# Patient Record
Sex: Female | Born: 1965 | Race: White | Hispanic: No | Marital: Married | Smoking: Never smoker
Health system: Southern US, Community
[De-identification: ages and names within clinical notes are randomized; demographics above are authoritative.]

## PROBLEM LIST (undated history)

## (undated) DIAGNOSIS — J329 Chronic sinusitis, unspecified: Secondary | ICD-10-CM

## (undated) DIAGNOSIS — J45909 Unspecified asthma, uncomplicated: Secondary | ICD-10-CM

## (undated) DIAGNOSIS — M545 Low back pain, unspecified: Secondary | ICD-10-CM

## (undated) DIAGNOSIS — E669 Obesity, unspecified: Secondary | ICD-10-CM

## (undated) DIAGNOSIS — Z9889 Other specified postprocedural states: Secondary | ICD-10-CM

## (undated) DIAGNOSIS — G5732 Lesion of lateral popliteal nerve, left lower limb: Secondary | ICD-10-CM

## (undated) DIAGNOSIS — K449 Diaphragmatic hernia without obstruction or gangrene: Secondary | ICD-10-CM

## (undated) DIAGNOSIS — R112 Nausea with vomiting, unspecified: Secondary | ICD-10-CM

## (undated) DIAGNOSIS — F32A Depression, unspecified: Secondary | ICD-10-CM

## (undated) DIAGNOSIS — G894 Chronic pain syndrome: Secondary | ICD-10-CM

## (undated) DIAGNOSIS — M159 Polyosteoarthritis, unspecified: Secondary | ICD-10-CM

## (undated) DIAGNOSIS — M1990 Unspecified osteoarthritis, unspecified site: Secondary | ICD-10-CM

## (undated) DIAGNOSIS — R110 Nausea: Secondary | ICD-10-CM

## (undated) DIAGNOSIS — J189 Pneumonia, unspecified organism: Secondary | ICD-10-CM

## (undated) DIAGNOSIS — R7303 Prediabetes: Secondary | ICD-10-CM

## (undated) DIAGNOSIS — R296 Repeated falls: Secondary | ICD-10-CM

## (undated) DIAGNOSIS — I739 Peripheral vascular disease, unspecified: Secondary | ICD-10-CM

## (undated) DIAGNOSIS — M4310 Spondylolisthesis, site unspecified: Secondary | ICD-10-CM

## (undated) DIAGNOSIS — G629 Polyneuropathy, unspecified: Secondary | ICD-10-CM

## (undated) DIAGNOSIS — K219 Gastro-esophageal reflux disease without esophagitis: Secondary | ICD-10-CM

## (undated) DIAGNOSIS — I82503 Chronic embolism and thrombosis of unspecified deep veins of lower extremity, bilateral: Secondary | ICD-10-CM

## (undated) DIAGNOSIS — E785 Hyperlipidemia, unspecified: Secondary | ICD-10-CM

## (undated) DIAGNOSIS — F419 Anxiety disorder, unspecified: Secondary | ICD-10-CM

## (undated) DIAGNOSIS — M5030 Other cervical disc degeneration, unspecified cervical region: Secondary | ICD-10-CM

## (undated) DIAGNOSIS — M719 Bursopathy, unspecified: Secondary | ICD-10-CM

## (undated) DIAGNOSIS — R7309 Other abnormal glucose: Secondary | ICD-10-CM

## (undated) DIAGNOSIS — Z1211 Encounter for screening for malignant neoplasm of colon: Secondary | ICD-10-CM

## (undated) DIAGNOSIS — R632 Polyphagia: Secondary | ICD-10-CM

## (undated) DIAGNOSIS — I82402 Acute embolism and thrombosis of unspecified deep veins of left lower extremity: Secondary | ICD-10-CM

## (undated) DIAGNOSIS — T7840XA Allergy, unspecified, initial encounter: Secondary | ICD-10-CM

## (undated) DIAGNOSIS — F329 Major depressive disorder, single episode, unspecified: Secondary | ICD-10-CM

## (undated) HISTORY — DX: Other specified postprocedural states: R11.2

## (undated) HISTORY — DX: Anxiety disorder, unspecified: F41.9

## (undated) HISTORY — DX: Unspecified osteoarthritis, unspecified site: M19.90

## (undated) HISTORY — DX: Bursopathy, unspecified: M71.9

## (undated) HISTORY — PX: HIP SURGERY: SHX245

## (undated) HISTORY — DX: Chronic sinusitis, unspecified: J32.9

## (undated) HISTORY — DX: Low back pain, unspecified: M54.50

## (undated) HISTORY — PX: JOINT REPLACEMENT: SHX530

## (undated) HISTORY — DX: Other specified postprocedural states: Z98.890

## (undated) HISTORY — DX: Allergy, unspecified, initial encounter: T78.40XA

## (undated) HISTORY — DX: Encounter for screening for malignant neoplasm of colon: Z12.11

## (undated) HISTORY — DX: Gastro-esophageal reflux disease without esophagitis: K21.9

## (undated) HISTORY — DX: Acute embolism and thrombosis of unspecified deep veins of left lower extremity: I82.402

## (undated) HISTORY — DX: Pneumonia, unspecified organism: J18.9

## (undated) HISTORY — DX: Repeated falls: R29.6

## (undated) HISTORY — DX: Obesity, unspecified: E66.9

## (undated) HISTORY — PX: SCLEROTHERAPY: SHX6841

## (undated) HISTORY — DX: Diaphragmatic hernia without obstruction or gangrene: K44.9

## (undated) HISTORY — DX: Lesion of lateral popliteal nerve, left lower limb: G57.32

## (undated) HISTORY — PX: ABLATION: SHX5711

## (undated) HISTORY — PX: ROTATOR CUFF REPAIR: SHX139

---
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
## 2002-07-09 DIAGNOSIS — J449 Chronic obstructive pulmonary disease, unspecified: Secondary | ICD-10-CM

## 2002-07-09 HISTORY — DX: Chronic obstructive pulmonary disease, unspecified: J44.9

## 2005-12-07 ENCOUNTER — Ambulatory Visit: Payer: Self-pay | Admitting: Unknown Physician Specialty

## 2006-12-26 ENCOUNTER — Ambulatory Visit: Payer: Self-pay

## 2009-07-09 HISTORY — PX: BREAST BIOPSY: SHX20

## 2009-10-21 DIAGNOSIS — F411 Generalized anxiety disorder: Secondary | ICD-10-CM | POA: Insufficient documentation

## 2010-07-09 DIAGNOSIS — I82409 Acute embolism and thrombosis of unspecified deep veins of unspecified lower extremity: Secondary | ICD-10-CM

## 2010-07-09 HISTORY — DX: Acute embolism and thrombosis of unspecified deep veins of unspecified lower extremity: I82.409

## 2011-05-10 HISTORY — PX: HIP ARTHROSCOPY: SHX668

## 2013-07-09 HISTORY — PX: FOOT SURGERY: SHX648

## 2014-07-09 HISTORY — PX: BACK SURGERY: SHX140

## 2014-07-09 HISTORY — PX: SPINAL FUSION: SHX223

## 2014-07-27 DIAGNOSIS — M5416 Radiculopathy, lumbar region: Secondary | ICD-10-CM | POA: Insufficient documentation

## 2014-07-27 DIAGNOSIS — M47817 Spondylosis without myelopathy or radiculopathy, lumbosacral region: Secondary | ICD-10-CM | POA: Insufficient documentation

## 2014-07-27 DIAGNOSIS — M4807 Spinal stenosis, lumbosacral region: Secondary | ICD-10-CM | POA: Insufficient documentation

## 2014-07-27 DIAGNOSIS — M4310 Spondylolisthesis, site unspecified: Secondary | ICD-10-CM | POA: Insufficient documentation

## 2014-07-27 HISTORY — DX: Spondylosis without myelopathy or radiculopathy, lumbosacral region: M47.817

## 2015-08-31 DIAGNOSIS — M2550 Pain in unspecified joint: Secondary | ICD-10-CM | POA: Insufficient documentation

## 2015-08-31 DIAGNOSIS — G5603 Carpal tunnel syndrome, bilateral upper limbs: Secondary | ICD-10-CM | POA: Insufficient documentation

## 2015-08-31 DIAGNOSIS — Z79891 Long term (current) use of opiate analgesic: Secondary | ICD-10-CM | POA: Insufficient documentation

## 2015-08-31 DIAGNOSIS — D72825 Bandemia: Secondary | ICD-10-CM

## 2015-08-31 DIAGNOSIS — M4310 Spondylolisthesis, site unspecified: Secondary | ICD-10-CM | POA: Insufficient documentation

## 2015-08-31 DIAGNOSIS — G894 Chronic pain syndrome: Secondary | ICD-10-CM | POA: Insufficient documentation

## 2015-08-31 HISTORY — DX: Pain in unspecified joint: M25.50

## 2015-08-31 HISTORY — DX: Bandemia: D72.825

## 2015-12-08 HISTORY — PX: SHOULDER SURGERY: SHX246

## 2015-12-08 HISTORY — PX: CARPAL TUNNEL RELEASE: SHX101

## 2016-04-19 ENCOUNTER — Other Ambulatory Visit: Payer: Self-pay | Admitting: Obstetrics and Gynecology

## 2016-04-19 DIAGNOSIS — Z1231 Encounter for screening mammogram for malignant neoplasm of breast: Secondary | ICD-10-CM

## 2016-04-24 ENCOUNTER — Ambulatory Visit
Admission: RE | Admit: 2016-04-24 | Discharge: 2016-04-24 | Disposition: A | Discharge status: Home / Self Care | Payer: 59 | Source: Ambulatory Visit | Attending: Obstetrics and Gynecology | Admitting: Obstetrics and Gynecology

## 2016-04-24 ENCOUNTER — Encounter (HOSPITAL_COMMUNITY): Payer: Self-pay

## 2016-04-24 DIAGNOSIS — Z1231 Encounter for screening mammogram for malignant neoplasm of breast: Secondary | ICD-10-CM | POA: Insufficient documentation

## 2016-04-24 IMAGING — MG MM DIGITAL SCREENING BILAT W/ CAD
4 series · 4 of 4 positions shown · non-contrast
Comparison: Previous exam(s).

CLINICAL DATA: Screening.

EXAM:
DIGITAL SCREENING BILATERAL MAMMOGRAM WITH CAD

[R MLO]
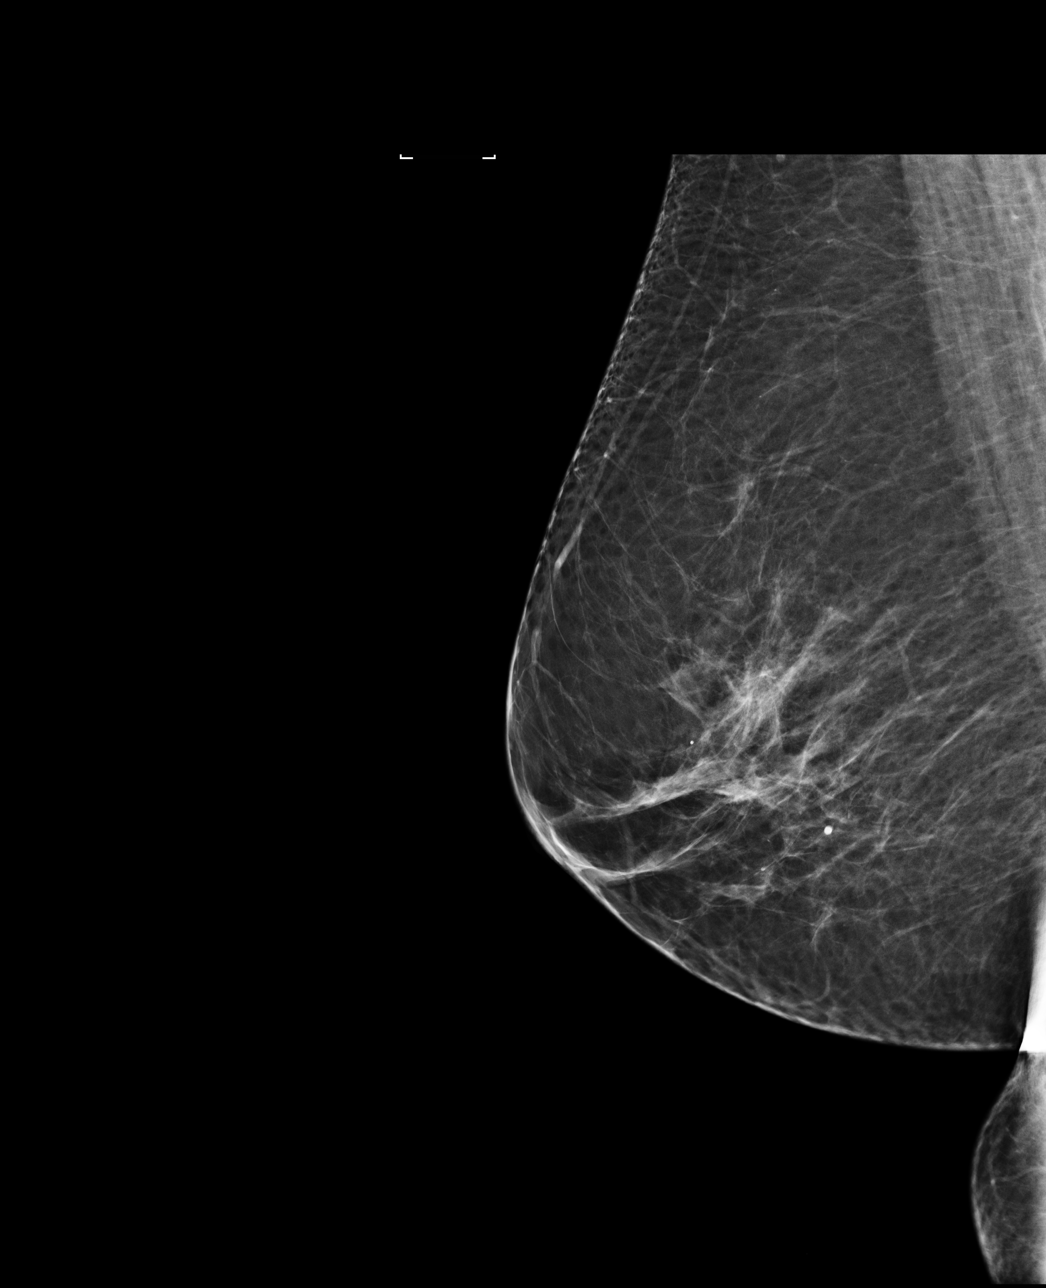

[L MLO]
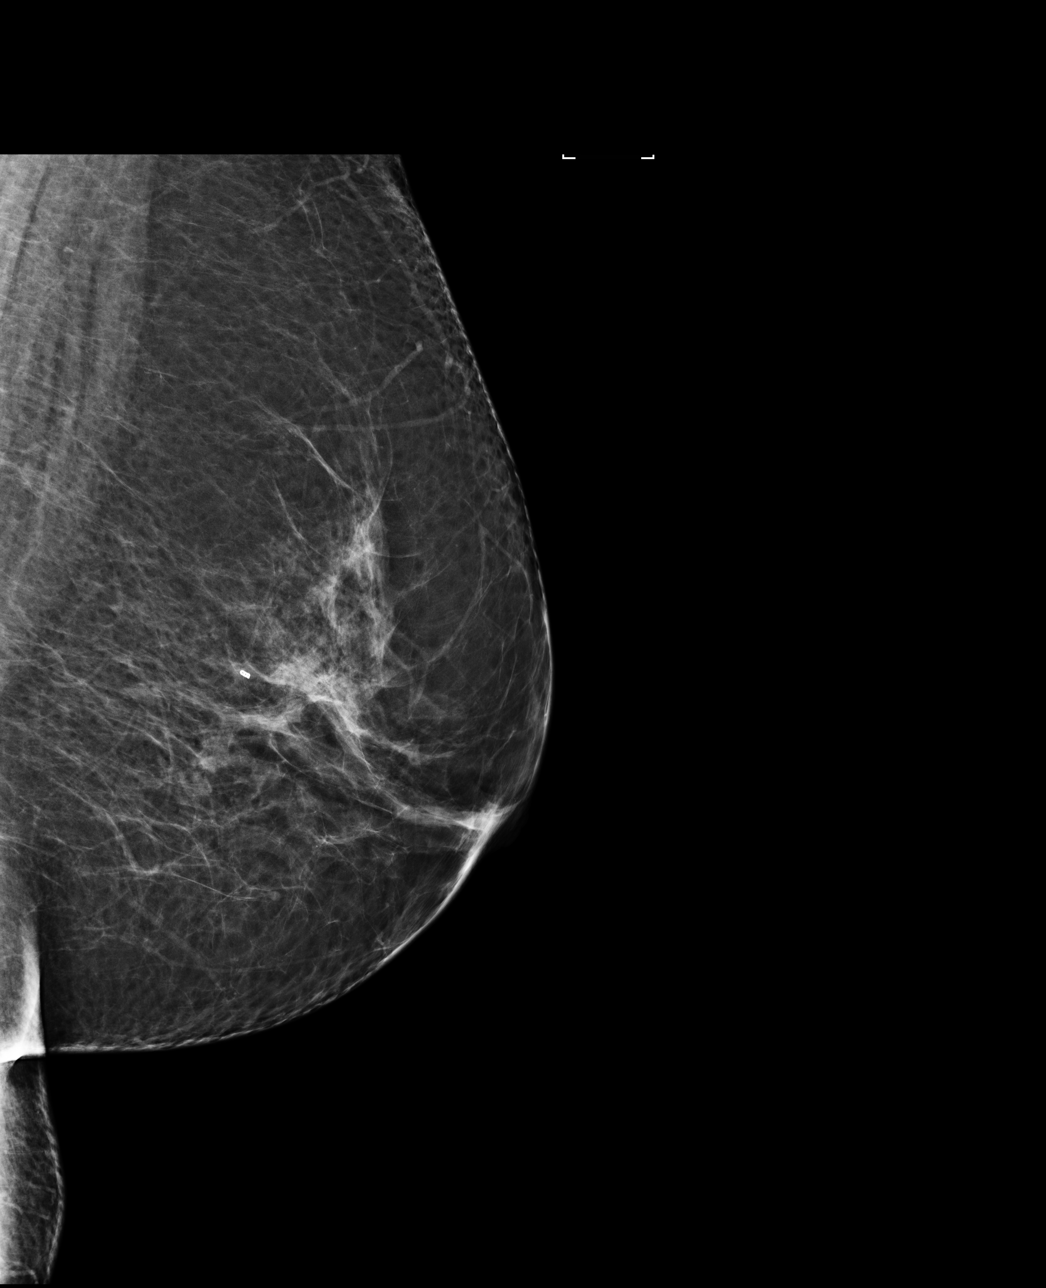

[L CC]
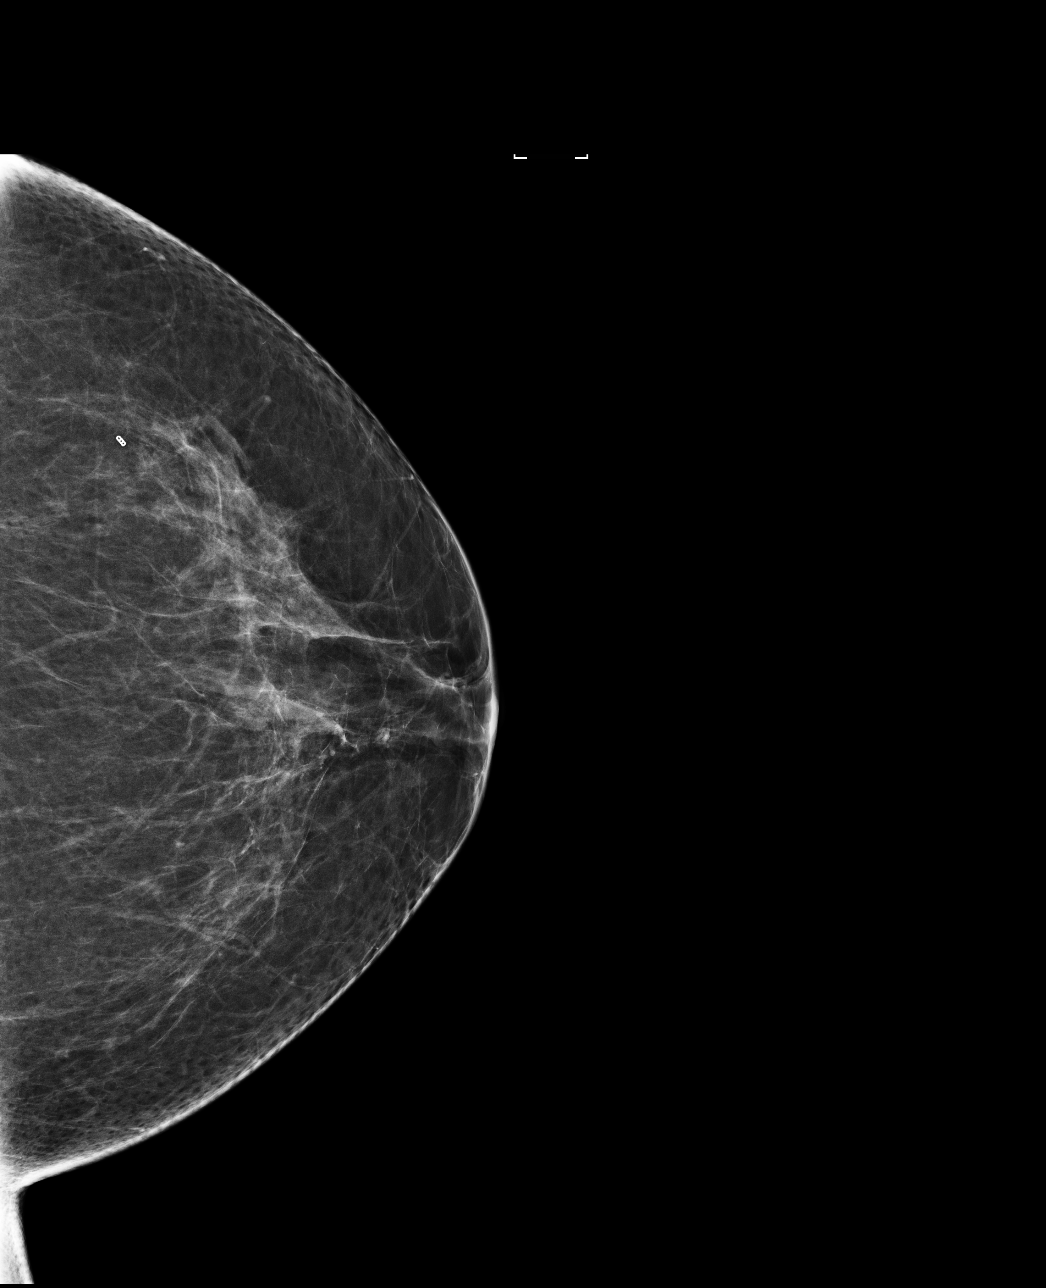

[R CC]
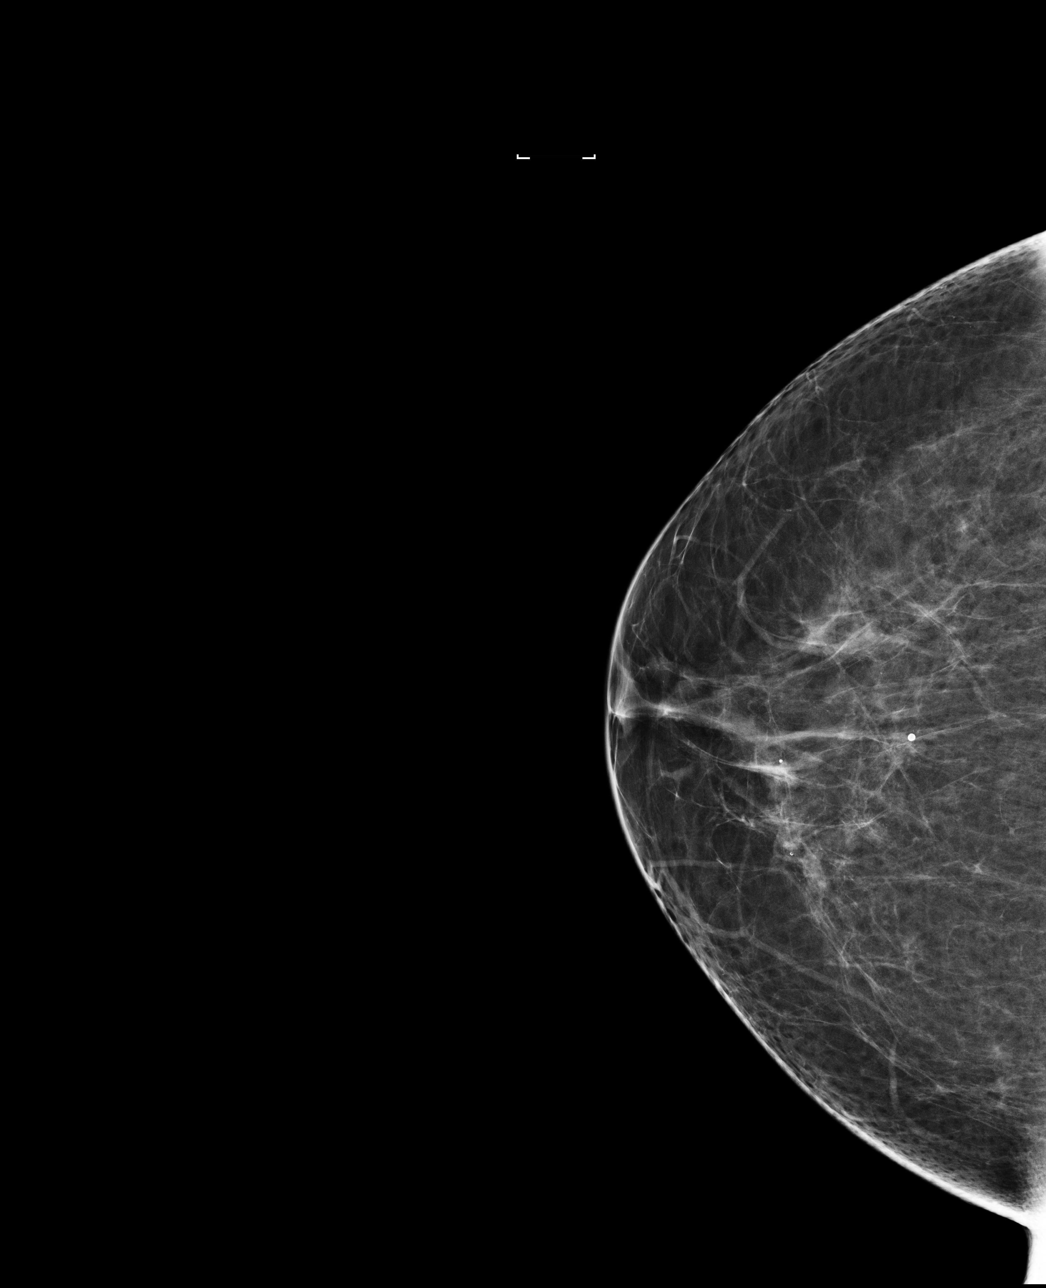

[4 of 4 positions shown; findings below may reference images not displayed]

ACR Breast Density Category b: There are scattered areas of
fibroglandular density.
FINDINGS: There are no findings suspicious for malignancy. Images were
processed with CAD.
IMPRESSION: No mammographic evidence of malignancy. A result letter of this
screening mammogram will be mailed directly to the patient.

RECOMMENDATION:
Screening mammogram in one year. (Code:[US])

BI-RADS CATEGORY  1: Negative.

## 2016-05-02 ENCOUNTER — Other Ambulatory Visit: Payer: Self-pay | Admitting: *Deleted

## 2016-05-02 ENCOUNTER — Ambulatory Visit
Admission: RE | Admit: 2016-05-02 | Discharge: 2016-05-02 | Disposition: A | Discharge status: Home / Self Care | Payer: Self-pay | Source: Ambulatory Visit | Attending: *Deleted | Admitting: *Deleted

## 2016-05-02 DIAGNOSIS — Z9289 Personal history of other medical treatment: Secondary | ICD-10-CM

## 2016-12-07 HISTORY — PX: CARPAL TUNNEL RELEASE: SHX101

## 2017-01-17 DIAGNOSIS — S96919A Strain of unspecified muscle and tendon at ankle and foot level, unspecified foot, initial encounter: Secondary | ICD-10-CM | POA: Insufficient documentation

## 2017-01-17 HISTORY — DX: Strain of unspecified muscle and tendon at ankle and foot level, unspecified foot, initial encounter: S96.919A

## 2017-06-06 ENCOUNTER — Other Ambulatory Visit: Payer: Self-pay | Admitting: Obstetrics and Gynecology

## 2017-06-06 DIAGNOSIS — Z1231 Encounter for screening mammogram for malignant neoplasm of breast: Secondary | ICD-10-CM

## 2017-07-12 ENCOUNTER — Ambulatory Visit
Admission: RE | Admit: 2017-07-12 | Discharge: 2017-07-12 | Disposition: A | Discharge status: Home / Self Care | Payer: 59 | Source: Ambulatory Visit | Attending: Obstetrics and Gynecology | Admitting: Obstetrics and Gynecology

## 2017-07-12 DIAGNOSIS — Z1231 Encounter for screening mammogram for malignant neoplasm of breast: Secondary | ICD-10-CM

## 2017-07-12 IMAGING — MG MM DIGITAL SCREENING BILAT W/ TOMO W/ CAD
8 of 12 series · 8 of 28 positions shown · non-contrast
Comparison: Previous exam(s).

CLINICAL DATA: Screening.

EXAM:
2D DIGITAL SCREENING BILATERAL MAMMOGRAM WITH 3D TOMO WITH CAD

[L MLO synth-2D]
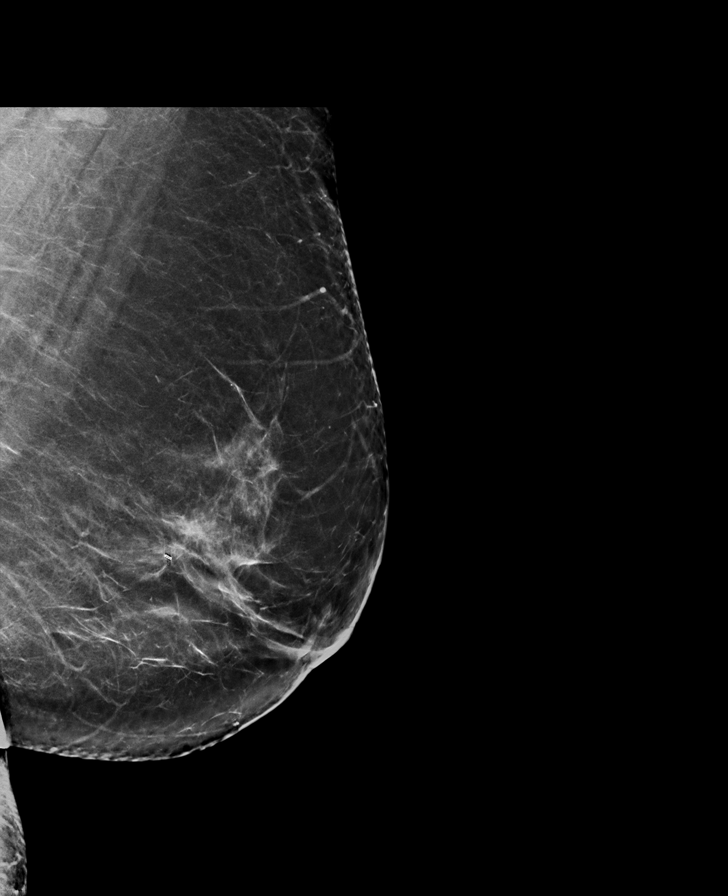

[L CC]
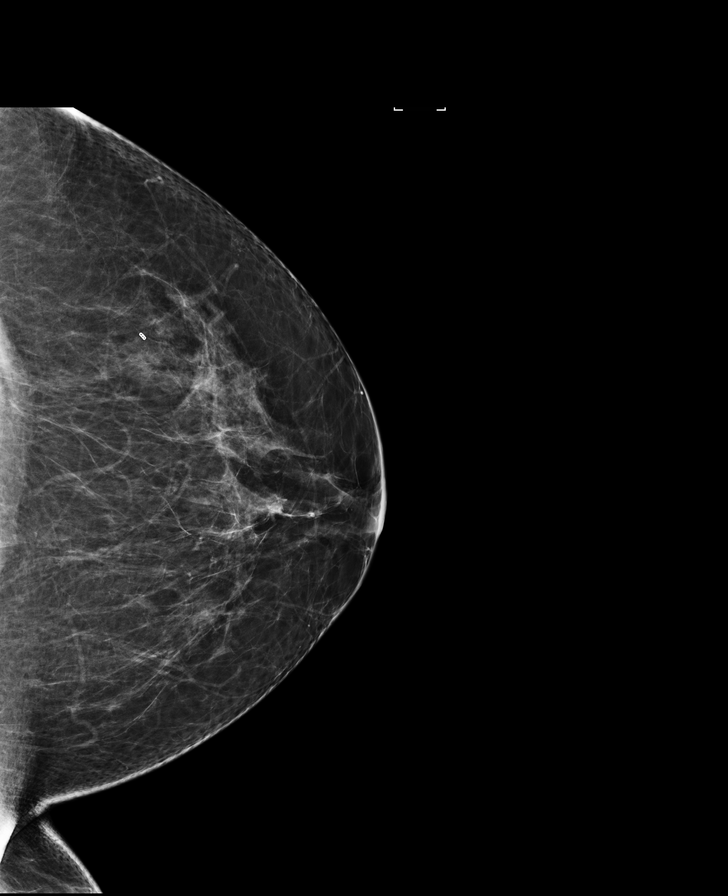

[R MLO synth-2D]
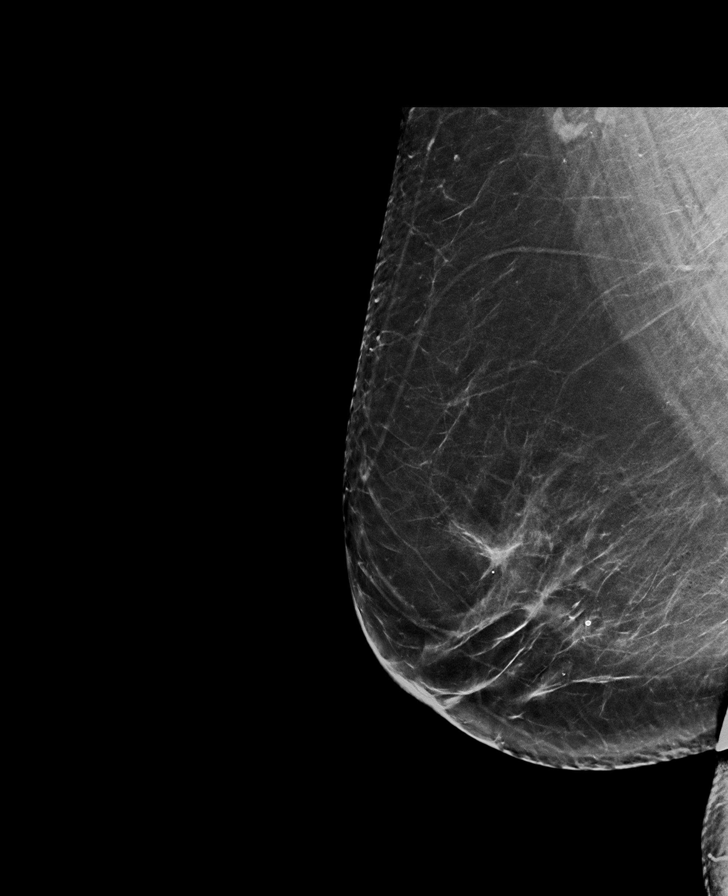

[L MLO]
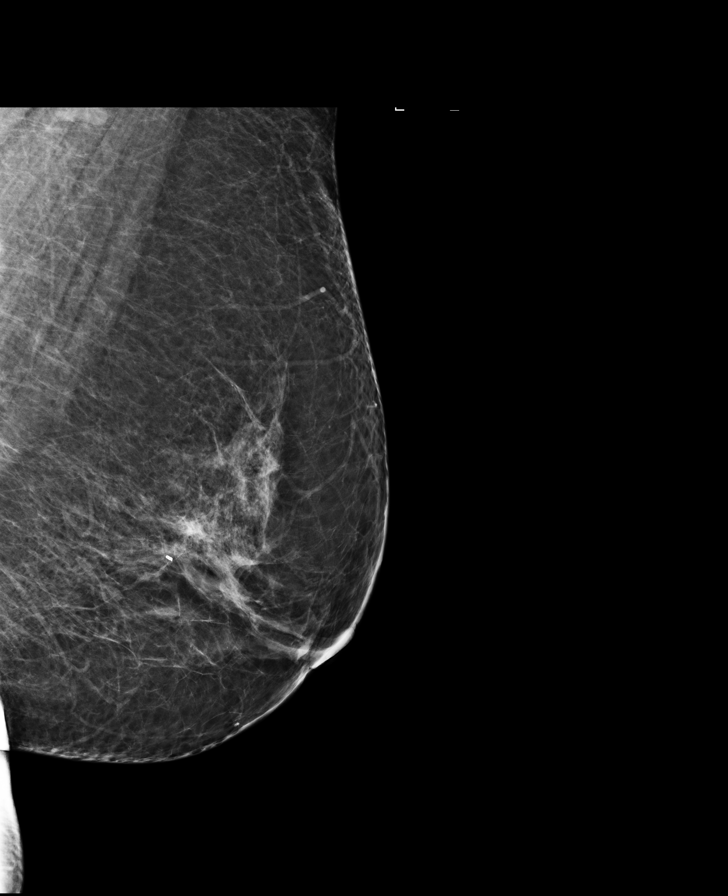

[L CC synth-2D]
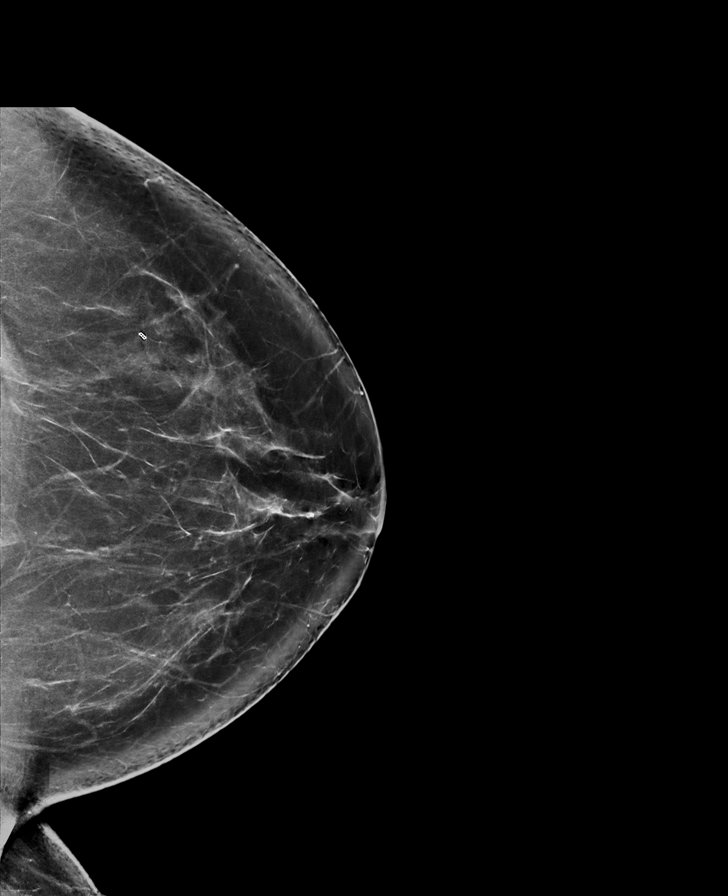

[R CC synth-2D]
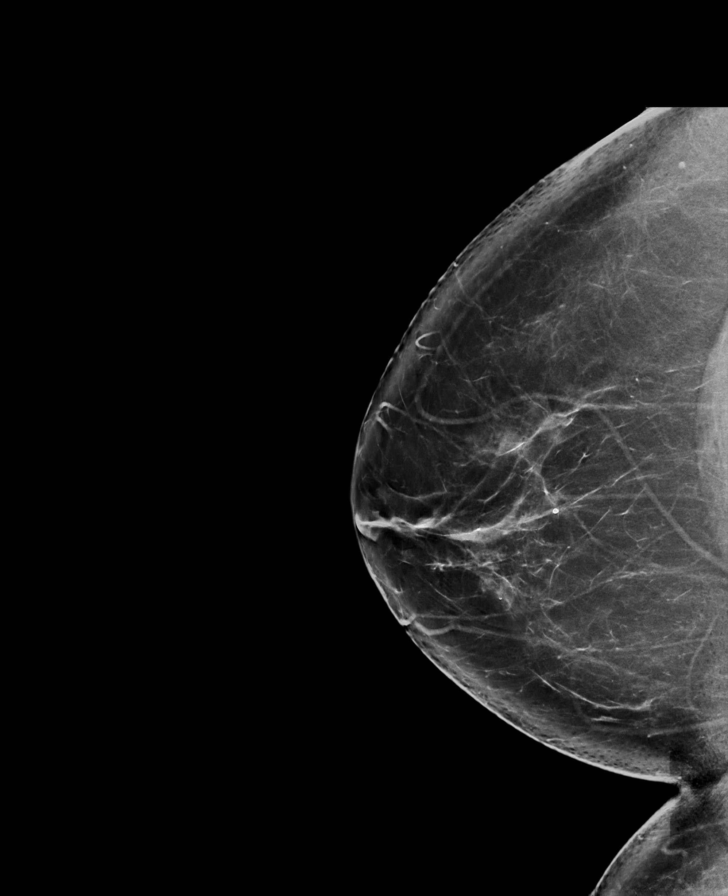

[R CC]
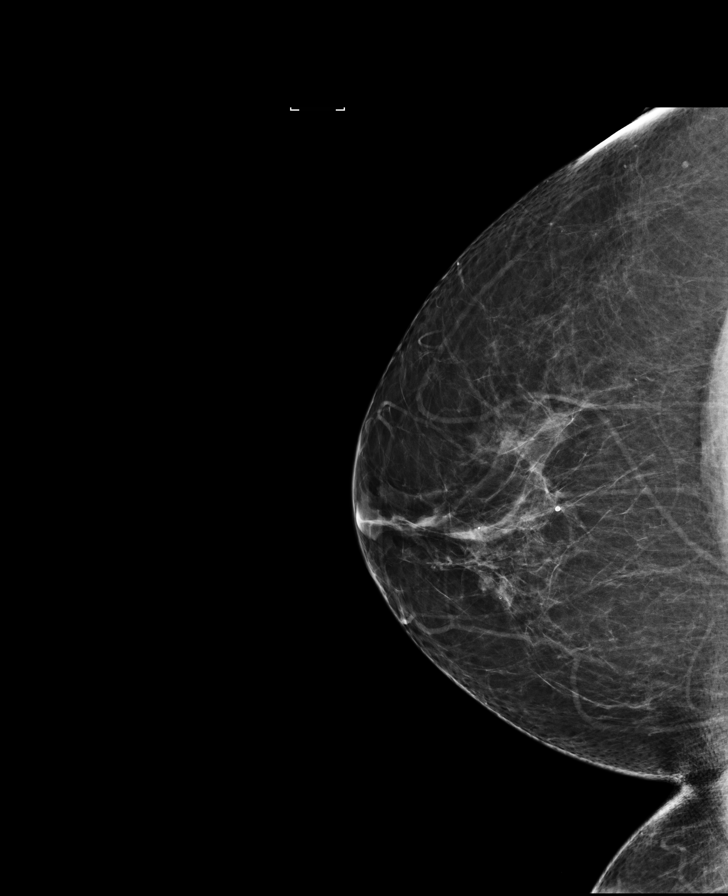

[R MLO]
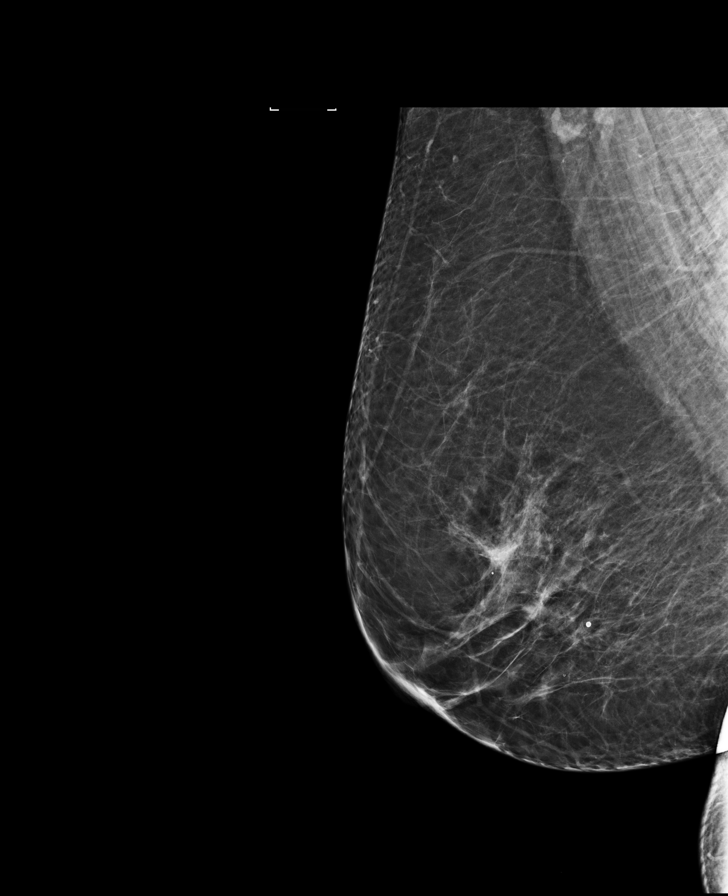

[8 of 28 positions shown; findings below may reference images not displayed]

ACR Breast Density Category b: There are scattered areas of
fibroglandular density.
FINDINGS: There are no findings suspicious for malignancy. Images were
processed with CAD.
IMPRESSION: No mammographic evidence of malignancy. A result letter of this
screening mammogram will be mailed directly to the patient.

RECOMMENDATION:
Screening mammogram in one year. (Code:[4P])

BI-RADS CATEGORY  1: Negative.

## 2017-08-06 DIAGNOSIS — M79673 Pain in unspecified foot: Secondary | ICD-10-CM | POA: Insufficient documentation

## 2017-08-06 DIAGNOSIS — S92309A Fracture of unspecified metatarsal bone(s), unspecified foot, initial encounter for closed fracture: Secondary | ICD-10-CM | POA: Insufficient documentation

## 2017-08-06 DIAGNOSIS — S93409A Sprain of unspecified ligament of unspecified ankle, initial encounter: Secondary | ICD-10-CM | POA: Insufficient documentation

## 2017-08-06 HISTORY — DX: Fracture of unspecified metatarsal bone(s), unspecified foot, initial encounter for closed fracture: S92.309A

## 2017-12-07 HISTORY — PX: REPLACEMENT TOTAL HIP W/  RESURFACING IMPLANTS: SUR1222

## 2017-12-20 DIAGNOSIS — M1611 Unilateral primary osteoarthritis, right hip: Secondary | ICD-10-CM | POA: Insufficient documentation

## 2017-12-20 DIAGNOSIS — Z96641 Presence of right artificial hip joint: Secondary | ICD-10-CM | POA: Insufficient documentation

## 2018-01-29 ENCOUNTER — Other Ambulatory Visit: Payer: Self-pay

## 2018-01-29 ENCOUNTER — Encounter: Payer: Self-pay | Admitting: Student in an Organized Health Care Education/Training Program

## 2018-01-29 ENCOUNTER — Ambulatory Visit
Payer: 59 | Attending: Student in an Organized Health Care Education/Training Program | Admitting: Student in an Organized Health Care Education/Training Program

## 2018-01-29 VITALS — BP 119/86 | HR 82 | Temp 98.9°F | Resp 16 | Ht 67.0 in | Wt 199.0 lb

## 2018-01-29 DIAGNOSIS — M25551 Pain in right hip: Secondary | ICD-10-CM | POA: Diagnosis present

## 2018-01-29 DIAGNOSIS — M4807 Spinal stenosis, lumbosacral region: Secondary | ICD-10-CM

## 2018-01-29 DIAGNOSIS — G5603 Carpal tunnel syndrome, bilateral upper limbs: Secondary | ICD-10-CM | POA: Insufficient documentation

## 2018-01-29 DIAGNOSIS — Z79891 Long term (current) use of opiate analgesic: Secondary | ICD-10-CM | POA: Insufficient documentation

## 2018-01-29 DIAGNOSIS — Z9889 Other specified postprocedural states: Secondary | ICD-10-CM | POA: Insufficient documentation

## 2018-01-29 DIAGNOSIS — G894 Chronic pain syndrome: Secondary | ICD-10-CM

## 2018-01-29 DIAGNOSIS — M545 Low back pain: Secondary | ICD-10-CM | POA: Diagnosis present

## 2018-01-29 DIAGNOSIS — M4316 Spondylolisthesis, lumbar region: Secondary | ICD-10-CM | POA: Diagnosis not present

## 2018-01-29 DIAGNOSIS — Z981 Arthrodesis status: Secondary | ICD-10-CM | POA: Insufficient documentation

## 2018-01-29 DIAGNOSIS — M1611 Unilateral primary osteoarthritis, right hip: Secondary | ICD-10-CM | POA: Diagnosis not present

## 2018-01-29 DIAGNOSIS — Z882 Allergy status to sulfonamides status: Secondary | ICD-10-CM | POA: Insufficient documentation

## 2018-01-29 DIAGNOSIS — Z96641 Presence of right artificial hip joint: Secondary | ICD-10-CM | POA: Insufficient documentation

## 2018-01-29 DIAGNOSIS — M5416 Radiculopathy, lumbar region: Secondary | ICD-10-CM | POA: Diagnosis not present

## 2018-01-29 DIAGNOSIS — Z881 Allergy status to other antibiotic agents status: Secondary | ICD-10-CM | POA: Insufficient documentation

## 2018-01-29 DIAGNOSIS — M4310 Spondylolisthesis, site unspecified: Secondary | ICD-10-CM

## 2018-01-29 DIAGNOSIS — Z79899 Other long term (current) drug therapy: Secondary | ICD-10-CM | POA: Diagnosis not present

## 2018-01-29 DIAGNOSIS — M47817 Spondylosis without myelopathy or radiculopathy, lumbosacral region: Secondary | ICD-10-CM

## 2018-01-29 HISTORY — DX: Arthrodesis status: Z98.1

## 2018-01-29 HISTORY — DX: Presence of right artificial hip joint: Z96.641

## 2018-01-29 NOTE — Progress Notes (Signed)
Safety precautions to be maintained throughout the outpatient stay will include: orient to surroundings, keep bed in low position, maintain call bell within reach at all times, provide assistance with transfer out of bed and ambulation.  

## 2018-01-29 NOTE — Progress Notes (Signed)
Patient's Name: Maureen Randall  MRN: [id number]  Referring Provider: Laneta Simmers, NP  DOB: October 27, 1965  PCP: Lavonne Chick, MD  DOS: 01/29/2018  Note by: Gillis Santa, MD  Service setting: Ambulatory outpatient  Specialty: Interventional Pain Management  Location: [facility] (AMB) Pain Management Facility  Visit type: Initial Patient Evaluation  Patient type: New Patient   Primary Reason(s) for Visit: Encounter for initial evaluation of one or more chronic problems (new to examiner) potentially causing chronic pain, and posing a threat to normal musculoskeletal function. (Level of risk: High) CC: Hip Pain (right ) and Back Pain (lower)  HPI  Maureen Randall is a 52 y.o. year old, female patient, who comes today to see [place] for the first time for an initial evaluation of her chronic pain. 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 Today she comes in for evaluation of her Hip Pain (right ) and Back Pain (lower)  Pain Assessment: Location: Right Hip Radiating: to lower back, bilaterally; right side is worse Onset: More than a month ago Duration: Chronic pain Quality: Constant, Shooting, Stabbing Severity: 7 /10 (subjective, self-reported pain score)  Note: Reported level is inconsistent with clinical observations. Clinically the patient looks like a 3/10 A 3/10 is viewed as "Moderate" and described as significantly interfering with activities of daily living (ADL). It becomes difficult to feed, bathe, get dressed, get on and off the toilet or to perform personal hygiene functions. Difficult to get in and out of bed or a chair without  assistance. Very distracting. With effort, it can be ignored when deeply involved in activities.       When using our objective Pain Scale, levels between 6 and 10/10 are said to belong in an emergency room, as it progressively worsens from a 6/10, described as severely limiting, requiring emergency care not usually available at an outpatient pain management facility. At a 6/10 level, communication becomes difficult and requires great effort. Assistance to reach the emergency department may be required. Facial flushing and profuse sweating along with potentially dangerous increases in heart rate and blood pressure will be evident. Effect on ADL: disruptive, cannot sleep well Timing: Constant Modifying factors: medications, ice, PT BP: 119/107  HR: 1 52 year old female with a history of chronic axial low back pain and lumbar spondylolisthesis status post  Onset and Duration: Gradual and Present longer than 3 months Cause of pain: Unknown Severity: No change since onset Timing: Night, During activity or exercise, After activity or exercise and After a period of immobility Aggravating Factors: Bending, Climbing, Kneeling, Lifiting, Prolonged sitting, Prolonged standing, Squatting, Twisting and Working Alleviating Factors: Stretching, Cold packs, Hot packs, Lying down, Medications and Resting Associated Problems: Night-time cramps, Fatigue, Spasms, Swelling, Tingling, Weakness, Pain that wakes patient up and Pain that does not allow patient to sleep Quality of Pain: Aching, Annoying, Burning, Cramping, Deep, Exhausting, Nagging, Pressure-like, Pulsating, Sharp, Shooting, Stabbing, Tender, Tingling and Work related Previous Examinations or Tests: CT scan, MRI scan, X-rays, Nerve conduction test, Orthopedic evaluation and Chiropractic evaluation Previous Treatments: Narcotic medications, Physical Therapy, Steroid treatments by mouth, Stretching exercises and Trigger point injections  The patient comes  into the clinics today for the first time for a chronic pain management  evaluation.   52 year old female with a history of chronic axial low back pain and lumbar spondylosis as well as spondylolisthesis status post right hip replacement surgery on 12/20/2017 who follows up for continuation of chronic opioid therapy since her current provider is retiring and the patient needs to transfer care.  Patient also has a history of a L4-L5 lumbar spinal fusion related to lumbar spondylolisthesis.   Patient has been compliant with therapy from collateral documents.  She takes ibuprofen as needed in addition to her Percocet.  Her current dose is 7.5 mg 3 times daily as needed.  Patient also has peripheral neuropathy secondary to B12 deficiency for which she takes B12 shots.  Today I took the time to provide the patient with information regarding my pain practice. The patient was informed that my practice is divided into two sections: an interventional pain management section, as well as a completely separate and distinct medication management section. I explained that I have procedure days for my interventional therapies, and evaluation days for follow-ups and medication management. Because of the amount of documentation required during both, they are kept separated. This means that there is the possibility that she may be scheduled for a procedure on one day, and medication management the next. I have also informed her that because of staffing and facility limitations, I no longer take patients for medication management only. To illustrate the reasons for this, I gave the patient the example of surgeons, and how inappropriate it would be to refer a patient to his/her care, just to write for the post-surgical antibiotics on a surgery done by a different surgeon.   Because interventional pain management is my board-certified specialty, the patient was informed that joining my practice means that they are open to any and  all interventional therapies. I made it clear that this does not mean that they will be forced to have any procedures done. What this means is that I believe interventional therapies to be essential part of the diagnosis and proper management of chronic pain conditions. Therefore, patients not interested in these interventional alternatives will be better served under the care of a different practitioner.  The patient was also made aware of my Comprehensive Pain Management Safety Guidelines where by joining my practice, they limit all of their nerve blocks and joint injections to those done by our practice, for as long as we are retained to manage their care.   Historic Controlled Substance Pharmacotherapy Review  PMP and historical list of controlled substances: Percocet 7.5 mg 3 times daily as needed, quantity 90/month, last fill 01/12/2018 MME/day: 33.75 mg/day Medications: The patient did not bring the medication(s) to the appointment, as requested in our "New Patient Package" Pharmacodynamics: Desired effects: Analgesia: The patient reports >50% benefit. Reported improvement in function: The patient reports medication allows her to accomplish basic ADLs. Clinically meaningful improvement in function (CMIF): Sustained CMIF goals met Perceived effectiveness: Described as relatively effective, allowing for increase in activities of daily living (ADL) Undesirable effects: Side-effects or Adverse reactions: None reported Historical Monitoring: The patient  has no drug history on file. List of all UDS Test(s): No results found for: MDMA, COCAINSCRNUR, [place], [place], CANNABQUANT, [place], [place] List of other Serum/Urine Drug Screening Test(s):  No results found for: AMPHSCRSER, BARBSCRSER, BENZOSCRSER, COCAINSCRSER, COCAINSCRNUR, PCPSCRSER, PCPQUANT, THCSCRSER, THCU, CANNABQUANT, OPIATESCRSER, OXYSCRSER, PROPOXSCRSER, ETH Historical Background Evaluation: [place] PMP: Six (6) year initial data search  conducted.             [place]  Department of public safety, offender search: Editor, commissioning Information) Non-contributory Risk Assessment Profile: Aberrant behavior: None observed or detected today Risk factors for fatal opioid overdose: age 53-76 years old Fatal overdose hazard ratio (HR): Calculation deferred Non-fatal overdose hazard ratio (HR): Calculation deferred Risk of opioid abuse or dependence: 0.7-3.0% with doses ? 36 MME/day and 6.1-26% with doses ? 120 MME/day. Substance use disorder (SUD) risk level: See below Opioid risk tool (ORT) (Total Score): 0 Opioid Risk Tool - 01/29/18 1243      Family History of Substance Abuse   Alcohol  Negative    Illegal Drugs  Negative    Rx Drugs  Negative      Personal History of Substance Abuse   Alcohol  Negative    Illegal Drugs  Negative    Rx Drugs  Negative      Age   Age between 24-45 years   No      History of Preadolescent Sexual Abuse   History of Preadolescent Sexual Abuse  Negative or Female      Psychological Disease   Psychological Disease  Negative    Depression  Negative      Total Score   Opioid Risk Tool Scoring  0    Opioid Risk Interpretation  Low Risk      ORT Scoring interpretation table:  Score <3 = Low Risk for SUD  Score between 4-7 = Moderate Risk for SUD  Score >8 = High Risk for Opioid Abuse   PHQ-2 Depression Scale:  Total score: 0  PHQ-2 Scoring interpretation table: (Score and probability of major depressive disorder)  Score 0 = No depression  Score 1 = 15.4% Probability  Score 2 = 21.1% Probability  Score 3 = 38.4% Probability  Score 4 = 45.5% Probability  Score 5 = 56.4% Probability  Score 6 = 78.6% Probability   PHQ-9 Depression Scale:  Total score: 0  PHQ-9 Scoring interpretation table:  Score 0-4 = No depression  Score 5-9 = Mild depression  Score 10-14 = Moderate depression  Score 15-19 = Moderately severe depression  Score 20-27 = Severe depression (2.4 times higher risk of SUD and 2.89  times higher risk of overuse)   Pharmacologic Plan: As per protocol, I have not taken over any controlled substance management, pending the results of ordered tests and/or consults.            Initial impression: Pending review of available data and ordered tests.  Meds   Current Outpatient Medications:  .  Cyanocobalamin (B-12 COMPLIANCE INJECTION IJ), Inject as directed every 3 (three) months., Disp: , Rfl:  .  fluticasone (FLONASE) 50 MCG/ACT nasal spray, Place into both nostrils daily., Disp: , Rfl:  .  ibuprofen (ADVIL,MOTRIN) 800 MG tablet, Take 800 mg by mouth every 8 (eight) hours as needed., Disp: , Rfl:  .  methocarbamol (ROBAXIN) 500 MG tablet, Take 500 mg by mouth every 8 (eight) hours as needed for muscle spasms., Disp: , Rfl:  .  Naproxen-Esomeprazole (VIMOVO PO), Take 500 mg by mouth 2 (two) times daily as needed., Disp: , Rfl:  .  nitrofurantoin (MACRODANTIN) 50 MG capsule, Take 50 mg by mouth 4 (four) times daily as needed., Disp: , Rfl:  .  oxyCODONE-acetaminophen (PERCOCET) 7.5-325 MG tablet, Take 1 tablet by mouth every 8 (eight) hours as needed for severe pain., Disp: , Rfl:  .  valACYclovir (VALTREX) 500 MG tablet, Take 500 mg by mouth as needed., Disp: , Rfl:   Imaging Review  Complexity Note: Imaging results reviewed. Results shared with Maureen Randall, using Layman's terms.                         ROS  Cardiovascular: Daily Aspirin intake and Blood thinners:  Anticoagulant Pulmonary or Respiratory: No reported pulmonary signs or symptoms such as wheezing and difficulty taking a deep full breath (Asthma), difficulty blowing air out (Emphysema), coughing up mucus (Bronchitis), persistent dry cough, or temporary stoppage of breathing during sleep Neurological: No reported neurological signs or symptoms such as seizures, abnormal skin sensations, urinary and/or fecal incontinence, being born with an abnormal open spine and/or a tethered spinal cord Review of Past  Neurological Studies: No results found for this or any previous visit. Psychological-Psychiatric: No reported psychological or psychiatric signs or symptoms such as difficulty sleeping, anxiety, depression, delusions or hallucinations (schizophrenial), mood swings (bipolar disorders) or suicidal ideations or attempts Gastrointestinal: No reported gastrointestinal signs or symptoms such as vomiting or evacuating blood, reflux, heartburn, alternating episodes of diarrhea and constipation, inflamed or scarred liver, or pancreas or irrregular and/or infrequent bowel movements Genitourinary: No reported renal or genitourinary signs or symptoms such as difficulty voiding or producing urine, peeing blood, non-functioning kidney, kidney stones, difficulty emptying the bladder, difficulty controlling the flow of urine, or chronic kidney disease Hematological: Brusing easily Endocrine: No reported endocrine signs or symptoms such as high or low blood sugar, rapid heart rate due to high thyroid levels, obesity or weight gain due to slow thyroid or thyroid disease Rheumatologic: Joint aches and or swelling due to excess weight (Osteoarthritis) Musculoskeletal: Negative for myasthenia gravis, muscular dystrophy, multiple sclerosis or malignant hyperthermia Work History: Working full time  Allergies  Maureen Randall is allergic to cephalexin and septra [sulfamethoxazole-trimethoprim].  Laboratory Chemistry  Inflammation Markers (CRP: Acute Phase) (ESR: Chronic Phase) No results found for: CRP, ESRSEDRATE, LATICACIDVEN                       Rheumatology Markers No results found for: RF, ANA, LABURIC, URICUR, LYMEIGGIGMAB, LYMEABIGMQN, HLAB27                      Renal Function Markers No results found for: BUN, CREATININE, BCR, GFRAA, GFRNONAA                           Hepatic Function Markers No results found for: AST, ALT, ALBUMIN, ALKPHOS, HCVAB, AMYLASE, LIPASE, AMMONIA                       Electrolytes No results found for: NA, K, CL, CALCIUM, MG, PHOS                      Neuropathy Markers No results found for: VITAMINB12, FOLATE, HGBA1C, HIV                      Bone Pathology Markers No results found for: VD25OH, [id number], [id number], [id number], 25OHVITD1, 25OHVITD2, 25OHVITD3, TESTOFREE, TESTOSTERONE                       Coagulation Parameters No results found for: INR, LABPROT, APTT, PLT, DDIMER                      Cardiovascular Markers No results found for: BNP, CKTOTAL, CKMB, TROPONINI, HGB, HCT  CA Markers No results found for: CEA, CA125, LABCA2                      Note: Lab results reviewed.  [place]  Drug: Maureen Randall  has no drug history on file. Alcohol:  has no alcohol history on file. Tobacco:  reports that she has never smoked. She has never used smokeless tobacco. Medical:  has a past medical history of Allergy and Arthritis. Family: family history includes Breast cancer in her maternal aunt and paternal aunt.  Past Surgical History:  Procedure Laterality Date  . BREAST BIOPSY Left    neg  . CARPAL TUNNEL RELEASE Right 12/2015   [facility]  . CARPAL TUNNEL RELEASE Left 12/2016  . FOOT SURGERY Right 2015  . HIP ARTHROSCOPY Right 05/2011  . REPLACEMENT TOTAL HIP W/  RESURFACING IMPLANTS Right 12/2017   [place] Right 12/2015   [facility]  . SPINAL FUSION  2016   [facility]   Active Ambulatory Problems    Diagnosis Date Noted  . Bilateral lumbar radiculopathy 07/27/2014  . Degenerative spondylolisthesis 07/27/2014  . Facet arthropathy, lumbosacral 07/27/2014  . Primary osteoarthritis of one hip, right 12/20/2017  . 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 History of lumbar fusion 01/29/2018  . Long term current use of opiate analgesic 08/31/2015  . Chronic pain syndrome 08/31/2015  . Bilateral carpal  tunnel syndrome 08/31/2015  . Arthralgia of multiple joints 08/31/2015   Resolved Ambulatory Problems    Diagnosis Date Noted  . No Resolved Ambulatory Problems   Past Medical History:  Diagnosis Date  . Allergy   . Arthritis    Constitutional Exam  General appearance: Well nourished, well developed, and well hydrated. In no apparent acute distress Vitals:   01/29/18 1226  BP: 119/86  Pulse: 82  Resp: 16  Temp: 98.9 F (37.2 C)  TempSrc: Oral  SpO2: 98%  Weight: 199 lb (90.3 kg)  Height: '5\' 7"'$  (1.702 m)   BMI Assessment: Estimated body mass index is 31.17 kg/m as calculated from the following:   Height as of this encounter: '5\' 7"'$  (1.702 m).   Weight as of this encounter: 199 lb (90.3 kg).  BMI interpretation table: BMI level Category Range association with higher incidence of chronic pain  <18 kg/m2 Underweight   18.5-24.9 kg/m2 Ideal body weight   25-29.9 kg/m2 Overweight Increased incidence by 20%  30-34.9 kg/m2 Obese (Class I) Increased incidence by 68%  35-39.9 kg/m2 Severe obesity (Class II) Increased incidence by 136%  >40 kg/m2 Extreme obesity (Class III) Increased incidence by 254%   Patient's current BMI Ideal Body weight  Body mass index is 31.17 kg/m. Ideal body weight: 61.6 kg (135 lb 12.9 oz) Adjusted ideal body weight: 73.1 kg (161 lb 1.3 oz)   BMI Readings from Last 4 Encounters:  01/29/18 31.17 kg/m   Wt Readings from Last 4 Encounters:  01/29/18 199 lb (90.3 kg)  Psych/Mental status: Alert, oriented x 3 (person, place, & time)       Eyes: PERLA Respiratory: No evidence of acute respiratory distress  Cervical Spine Area Exam  Skin & Axial Inspection: No masses, redness, edema, swelling, or associated skin lesions Alignment: Symmetrical Functional ROM: Unrestricted ROM      Stability: No instability detected Muscle Tone/Strength: Functionally intact. No obvious neuro-muscular anomalies detected. Sensory (Neurological): Unimpaired Palpation:  No palpable  anomalies              Upper Extremity (UE) Exam    Side: Right upper extremity  Side: Left upper extremity  Skin & Extremity Inspection: Skin color, temperature, and hair growth are WNL. No peripheral edema or cyanosis. No masses, redness, swelling, asymmetry, or associated skin lesions. No contractures.  Skin & Extremity Inspection: Skin color, temperature, and hair growth are WNL. No peripheral edema or cyanosis. No masses, redness, swelling, asymmetry, or associated skin lesions. No contractures.  Functional ROM: Unrestricted ROM          Functional ROM: Unrestricted ROM          Muscle Tone/Strength: Functionally intact. No obvious neuro-muscular anomalies detected.  Muscle Tone/Strength: Functionally intact. No obvious neuro-muscular anomalies detected.  Sensory (Neurological): Unimpaired          Sensory (Neurological): Unimpaired          Palpation: No palpable anomalies              Palpation: No palpable anomalies              Provocative Test(s):  Phalen's test: deferred Tinel's test: deferred Apley's scratch test (touch opposite shoulder):  Action 1 (Across chest): deferred Action 2 (Overhead): deferred Action 3 (LB reach): deferred   Provocative Test(s):  Phalen's test: deferred Tinel's test: deferred Apley's scratch test (touch opposite shoulder):  Action 1 (Across chest): deferred Action 2 (Overhead): deferred Action 3 (LB reach): deferred    Thoracic Spine Area Exam  Skin & Axial Inspection: No masses, redness, or swelling Alignment: Symmetrical Functional ROM: Unrestricted ROM Stability: No instability detected Muscle Tone/Strength: Functionally intact. No obvious neuro-muscular anomalies detected. Sensory (Neurological): Unimpaired Muscle strength & Tone: No palpable anomalies  Lumbar Spine Area Exam  Skin & Axial Inspection: Well healed scar from previous spine surgery detected Alignment: Symmetrical Functional ROM: Decreased ROM       Stability:  No instability detected Muscle Tone/Strength: Functionally intact. No obvious neuro-muscular anomalies detected. Sensory (Neurological): Musculoskeletal pain pattern Palpation: No palpable anomalies       Provocative Tests: Lumbar Hyperextension/rotation test: (+) bilaterally for facet joint pain. Lumbar quadrant test (Kemp's test): deferred today       Lumbar Lateral bending test: (+) due to pain. Patrick's Maneuver: (+) for bilateral hip arthralgia             FABER test: (+) for bilateral S-I arthralgia             Thigh-thrust test: deferred today       S-I compression test: deferred today       S-I distraction test: deferred today        Gait & Posture Assessment  Ambulation: Unassisted Gait: Relatively normal for age and body habitus Posture: WNL   Lower Extremity Exam    Side: Right lower extremity  Side: Left lower extremity  Stability: No instability observed          Stability: No instability observed          Skin & Extremity Inspection: Skin color, temperature, and hair growth are WNL. No peripheral edema or cyanosis. No masses, redness, swelling, asymmetry, or associated skin lesions. No contractures.  Skin & Extremity Inspection: Skin color, temperature, and hair growth are WNL. No peripheral edema or cyanosis. No masses, redness, swelling, asymmetry, or associated skin lesions. No contractures.  Functional ROM: Unrestricted ROM  Functional ROM: Unrestricted ROM                  Muscle Tone/Strength: Functionally intact. No obvious neuro-muscular anomalies detected.  Muscle Tone/Strength: Functionally intact. No obvious neuro-muscular anomalies detected.  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 Diagnoses of History of  right hip replacement (12/2017), History of lumbar fusion (L4/5), Primary osteoarthritis of one hip, right, Degenerative spondylolisthesis, Facet arthropathy, lumbosacral, and Chronic pain syndrome were also pertinent to this visit.  Visit Diagnosis (New problems to examiner): 1. Stenosis of lateral recess of lumbosacral spine   2. 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 History of lumbar fusion (L4/5)   4. Primary osteoarthritis of one hip, right   5. Degenerative spondylolisthesis   6. Facet arthropathy, lumbosacral   7. Chronic pain syndrome   General Recommendations: The pain condition that the patient suffers from is best treated with a multidisciplinary approach that involves an increase in physical activity to prevent de-conditioning and worsening of the pain cycle, as well as psychological counseling (formal and/or informal) to address the co-morbid psychological affects of pain. Treatment will often involve judicious use of pain medications and interventional procedures to decrease the pain, allowing the patient to participate in the physical activity that will ultimately produce long-lasting pain reductions. The goal of the multidisciplinary approach is to return the patient to a higher level of overall function and to restore their ability to perform activities of daily living.  52 year old female with a history of chronic axial low back pain and lumbar spondylosis as well as spondylolisthesis status post right hip replacement surgery on 12/20/2017 who follows up for continuation of chronic opioid therapy since her current provider is retiring and the patient needs to transfer care.  Patient also has a history of a L4-L5 lumbar spinal fusion related to lumbar spondylolisthesis.   Patient has been compliant with therapy from collateral documents.  She takes ibuprofen as needed in addition to her Percocet.  Her current dose is 7.5 mg 3 times daily as needed.  Patient also has peripheral  neuropathy secondary to B12 deficiency for which she takes B12 shots.  Patient has tried various non-opioid analgesics including gabapentin and Lyrica which were not effective.  She is also tried tizanidine and Flexeril which resulted in excessive sedation.   She states that her current regimen of Percocet as well as ibuprofen is helpful in managing her pain.  Patient does work in [employer], research (medical) and states that opioid therapy helps her function.  She has been on opioid therapy for many years.  We will continue her current dose of oxycodone 7.5 mill grams 3 times daily as needed pending her urine drug screen.  Plan of Care (Initial workup plan)  Note: Please be advised that as per protocol, today's visit has been an evaluation only. We have not taken over the patient's controlled substance management.  Ordered Lab-work, Procedure(s), Referral(s), & Consult(s): Orders Placed This Encounter  Procedures  . Compliance Drug Analysis, Ur   Pharmacological management options:  Opioid Analgesics: The patient was informed that there is no guarantee that she would be a candidate for opioid analgesics. The decision will be made following CDC guidelines. This decision will be based on the results of diagnostic studies, as well as Maureen Randall's risk profile.  Membrane stabilizer: To be determined at a later time  Muscle relaxant: To be determined at a later time  NSAID: To be determined at a later time  Other analgesic(s): To be determined at a later time    Provider-requested follow-up: Return in about 2 weeks (around 02/11/2018) for Medication Management.  Future Appointments  Date Time Provider [place]  02/03/2018  9:30 AM [place] [place] [facility]  02/11/2018  1:15 PM Gillis Santa, MD [facility] None    Primary Care Physician: Lavonne Chick, MD Location: [facility] Note by: Gillis Santa, M.D, Date: 01/29/2018; Time: 3:54 PM  There are no Patient  Instructions on file for this visit.

## 2018-02-02 LAB — COMPLIANCE DRUG ANALYSIS, UR

## 2018-02-03 ENCOUNTER — Ambulatory Visit: Payer: 59 | Admitting: Podiatry

## 2018-02-11 ENCOUNTER — Other Ambulatory Visit: Payer: Self-pay

## 2018-02-11 ENCOUNTER — Encounter: Payer: Self-pay | Admitting: Student in an Organized Health Care Education/Training Program

## 2018-02-11 ENCOUNTER — Ambulatory Visit
Payer: 59 | Attending: Student in an Organized Health Care Education/Training Program | Admitting: Student in an Organized Health Care Education/Training Program

## 2018-02-11 VITALS — BP 112/89 | HR 76 | Temp 98.3°F | Resp 16 | Ht 67.0 in | Wt 199.0 lb

## 2018-02-11 DIAGNOSIS — Z79899 Other long term (current) drug therapy: Secondary | ICD-10-CM | POA: Insufficient documentation

## 2018-02-11 DIAGNOSIS — Z96641 Presence of right artificial hip joint: Secondary | ICD-10-CM

## 2018-02-11 NOTE — Progress Notes (Signed)
Safety precautions to be maintained throughout the outpatient stay will include: orient to surroundings, keep bed in low position, maintain call bell within reach at all times, provide assistance with transfer out of bed and ambulation.   Nursing Pain Medication Assessment:  Safety precautions to be maintained throughout the outpatient stay will include: orient to surroundings, keep bed in low position, maintain call bell within reach at all times, provide assistance with transfer out of bed and ambulation.  Medication Inspection Compliance: Ms. Guerrera did not comply with our request to bring her pills to be counted. She was reminded that bringing the medication bottles, even when empty, is a requirement.  Medication: None brought in. Pill/Patch Count: None available to be counted. Bottle Appearance: No container available. Did not bring bottle(s) to appointment. Filled Date: N/A Last Medication intake:  Today

## 2018-02-13 ENCOUNTER — Ambulatory Visit: Payer: 59 | Admitting: Student in an Organized Health Care Education/Training Program

## 2018-04-30 ENCOUNTER — Encounter: Payer: Self-pay | Admitting: Psychiatry

## 2018-04-30 ENCOUNTER — Ambulatory Visit (INDEPENDENT_AMBULATORY_CARE_PROVIDER_SITE_OTHER): Payer: 59 | Admitting: Psychiatry

## 2018-04-30 ENCOUNTER — Other Ambulatory Visit: Payer: Self-pay

## 2018-04-30 VITALS — BP 127/88 | HR 82 | Temp 99.3°F | Wt 213.0 lb

## 2018-04-30 DIAGNOSIS — F411 Generalized anxiety disorder: Secondary | ICD-10-CM

## 2018-04-30 DIAGNOSIS — G4701 Insomnia due to medical condition: Secondary | ICD-10-CM

## 2018-04-30 DIAGNOSIS — T8332XA Displacement of intrauterine contraceptive device, initial encounter: Secondary | ICD-10-CM | POA: Insufficient documentation

## 2018-04-30 HISTORY — DX: Displacement of intrauterine contraceptive device, initial encounter: T83.32XA

## 2018-04-30 MED ORDER — NORTRIPTYLINE HCL 25 MG PO CAPS: 25.0000 mg | ORAL_CAPSULE | Freq: Every day | ORAL | 0 refills | Status: DC

## 2018-04-30 MED ORDER — HYDROXYZINE PAMOATE 25 MG PO CAPS: 25.0000 mg | ORAL_CAPSULE | Freq: Three times a day (TID) | ORAL | 0 refills | Status: DC | PRN

## 2018-04-30 NOTE — Progress Notes (Signed)
Psychiatric Initial Adult Assessment   Patient Identification: Maureen Randall MRN:  [id number] Date of Evaluation:  04/30/2018 Referral Source: Laneta Simmers NP Chief Complaint:   Chief Complaint    Establish Care; Anxiety; Stress    ' I am here to establish care.'   Visit Diagnosis:    ICD-10-CM   1. GAD (generalized anxiety disorder) F41.1 nortriptyline (PAMELOR) 25 MG capsule    hydrOXYzine (VISTARIL) 25 MG capsule  2. Insomnia due to medical condition G47.01 nortriptyline (PAMELOR) 25 MG capsule    hydrOXYzine (VISTARIL) 25 MG capsule    History of Present Illness:  Maureen Randall is a 52 year old female, presented to the clinic today to establish care. She is married  ,on leave of absence from her job , lives in [place]. She has a history of anxiety , insomnia ,chronic pain, primary osteoarthritis, lumbar radiculopathy, degenerative spondylolisthesis, bilateral carpal tunnel syndrome .  Today reports she has been struggling with anxiety symptoms since the past several years.  She reports she started struggling with back problems in 2012.  She reports that is when her anxiety symptoms started for the very first time.  She reports she went through at least 5 surgeries so far since 2012.  She recently had  right hip replacement surgery in June 2019.  She had several complications from the surgery per report.  She reports that surgery did not go too well and now she has to get left hip replacement soon.  She reports she is worried about her health in general.  She reports she has chronic pain, muscle spasms, physical limitations due to the same which also worries her a lot.  She reports she was on FMLA from her work for a long time however ran out of that and now they are asking her to apply for long-term disability.  She reports if she does not get approved that can be a big financial stressor for her.  She hence is worried about the same.  Patient reports she has symptoms like nervousness,  anxiety, feeling on edge, inability to stop worrying, worrying too much about different things, trouble relaxing, being so restless, easily annoyed and irritable and so on which has been going on, worsening since the past few weeks.  She reports she used to be on medications like Klonopin, Valium and so on in the past which helped to some extent.  Patient reports her nervousness and racing thoughts keeps her up at night.  Her sleep problems are also due to her chronic pain, muscle spasms and so on.  Patient denies any significant depressive symptoms.  She denies any suicidality.  She denies any perceptual disturbances.  She denies any homicidality.  Patient denies any history of trauma.  Patient denies any manic or hypomanic symptoms.  Patient reports she is motivated to start treatment-medications as well as psychotherapy sessions.  She has good social support system from her husband as well as her daughter.      Associated Signs/Symptoms: Depression Symptoms:  insomnia, psychomotor agitation, fatigue, difficulty concentrating, anxiety, (Hypo) Manic Symptoms:  Irritable Mood, Anxiety Symptoms:  Excessive Worry, Psychotic Symptoms:  denies PTSD Symptoms: Negative  Past Psychiatric History: Denies inpatient mental health admissions in the past.  Patient reports trials of medications in the past as noted below by PMD.  Previous Psychotropic Medications: Yes Xanax, diazepam, clonazepam,  Substance Abuse History in the last 12 months:  No.  Consequences of Substance Abuse: Negative  Past Medical History:  Past Medical History:  Diagnosis  Date  . Allergy   . Anxiety   . Arthritis    osteoarthritis    Past Surgical History:  Procedure Laterality Date  . BREAST BIOPSY Left    neg  . CARPAL TUNNEL RELEASE Right 12/2015   [facility]  . CARPAL TUNNEL RELEASE Left 12/2016  . FOOT SURGERY Right 2015  . HIP ARTHROSCOPY Right 05/2011  . REPLACEMENT TOTAL HIP W/  RESURFACING  IMPLANTS Right 12/2017   [place] Right 12/2015   [facility]  . SPINAL FUSION  2016   [facility]    Family Psychiatric History: Reports a history of dementia in her family.  Her father had dementia.  Dementia runs on his side of the family.  Family History:  Family History  Problem Relation Age of Onset  . Breast cancer Maternal Aunt   . Breast cancer Paternal Aunt     Social History:   Social History   Socioeconomic History  . Marital status: Married    Spouse name: fredrick  . Number of children: 1  . Years of education: Not on file  . Highest education level: High school graduate  Occupational History    Comment: full time  Social Needs  . Financial resource strain: Somewhat hard  . Food insecurity:    Worry: Sometimes true    Inability: Sometimes true  . Transportation needs:    Medical: No    Non-medical: No  Tobacco Use  . Smoking status: Never Smoker  . Smokeless tobacco: Never Used  Substance and Sexual Activity  . Alcohol use: Not Currently  . Drug use: Never  . Sexual activity: Yes  Lifestyle  . Physical activity:    Days per week: 0 days    Minutes per session: 0 min  . Stress: Very much  Relationships  . Social connections:    Talks on phone: Not on file    Gets together: Not on file    Attends religious service: Never    Active member of club or organization: No    Attends meetings of clubs or organizations: Never    Relationship status: Married  Other Topics Concern  . Not on file  Social History Narrative  . Not on file    Additional Social History: She is married.  She lives in [place].  She was born in [place], [place].  Her father is deceased.  She has 1 daughter-adult. Pt was employed as a Scientist, research (medical), currently on leave of absence.  Allergies:   Allergies  Allergen Reactions  . Cephalexin     rash  . Septra [Sulfamethoxazole-Trimethoprim]     hives    Metabolic Disorder Labs: No  results found for: HGBA1C, MPG No results found for: PROLACTIN No results found for: CHOL, TRIG, HDL, CHOLHDL, VLDL, LDLCALC   Current Medications: Current Outpatient Medications  Medication Sig Dispense Refill  . aspirin EC 81 MG tablet 1 twice a day with food for 4 weeks.  Do not start until eliquis completed    . clindamycin (CLEOCIN) 300 MG capsule TK 2 CS PO ONE HOUR PRIOR TO PROCEDURE  3  . Cyanocobalamin (B-12 COMPLIANCE INJECTION IJ) Inject as directed every 3 (three) months.    . diclofenac sodium (VOLTAREN) 1 % GEL   11  . Elastic Bandages & Supports (RELIEF KNEE) MISC Apply in the morning and remove at night.    . estradiol (ESTRACE) 0.1 MG/GM vaginal cream INSERT PEA SIZED AMOUNT (0.5G) INTO VAGINA NIGHTLY  EVERY OTHER NIGHT FOR 2 WEEKS, THEN TWICE WEEKLY    . fluticasone (FLONASE) 50 MCG/ACT nasal spray Place into both nostrils daily.    [name] gabapentin (NEURONTIN) 300 MG capsule TK 1 C PO BID P    . methocarbamol (ROBAXIN) 500 MG tablet Take 500 mg by mouth every 8 (eight) hours as needed for muscle spasms.    . Multiple Vitamin (MULTI-VITAMINS) TABS Take by mouth.    . Naproxen-Esomeprazole (VIMOVO PO) Take 500 mg by mouth 2 (two) times daily as needed.    . nitrofurantoin (MACRODANTIN) 50 MG capsule Take 50 mg by mouth 4 (four) times daily as needed.    [name] oxyCODONE-acetaminophen (PERCOCET) 7.5-325 MG tablet Take 1 tablet by mouth every 8 (eight) hours as needed for severe pain.    . valACYclovir (VALTREX) 500 MG tablet Take 500 mg by mouth as needed.    . hydrOXYzine (VISTARIL) 25 MG capsule Take 1 capsule (25 mg total) by mouth 3 (three) times daily as needed for anxiety. Only for severe anxiety symptoms 90 capsule 0  . nortriptyline (PAMELOR) 25 MG capsule Take 1 capsule (25 mg total) by mouth at bedtime. Mood, sleep 30 capsule 0   No current facility-administered medications for this visit.     Neurologic: Headache: No Seizure:  No Paresthesias:No  Musculoskeletal: Strength & Muscle Tone: within normal limits Gait & Station: normal Patient leans: N/A  Psychiatric Specialty Exam: Review of Systems  Psychiatric/Behavioral: The patient is nervous/anxious and has insomnia.   All other systems reviewed and are negative.   Blood pressure 127/88, pulse 82, temperature 99.3 F (37.4 C), temperature source Oral, weight 213 lb (96.6 kg).Body mass index is 33.36 kg/m.  General Appearance: Casual  Eye Contact:  Fair  Speech:  Clear and Coherent  Volume:  Normal  Mood:  Anxious and Dysphoric  Affect:  Congruent  Thought Process:  Goal Directed and Descriptions of Associations: Intact  Orientation:  Full (Time, Place, and Person)  Thought Content:  Logical  Suicidal Thoughts:  No  Homicidal Thoughts:  No  Memory:  Immediate;   Fair Recent;   Fair Remote;   Fair  Judgement:  Fair  Insight:  Fair  Psychomotor Activity:  Normal  Concentration:  Concentration: Fair and Attention Span: Fair  Recall:  [employer] of Knowledge:Fair  Language: Fair  Akathisia:  No  Handed:  Right  AIMS (if indicated):  na  Assets:  Communication Skills Desire for Improvement Housing Social Support  ADL's:  Intact  Cognition: WNL  Sleep:  poor    Treatment Plan Summary:Elyanna is a 52 year old Caucasian female, married, employed however currently on leave of absence from her job, lives in [place], has a history of chronic pain syndrome, history of multiple surgeries, recent right-sided hip replacement, primary osteoarthritis, degenerative spondylolisthesis, lumabr radiculopathy, bilateral carpal tunnel syndrome, presented to the clinic today to establish care.  Patient is biologically predisposed given her chronic pain as well as health problems.  She also has psychosocial stressors of physical limitations, financial stressors, job related stressors and so on.  Patient however has good social support system.  She denies any  substance abuse problems.  She denies any suicidality or past history of suicide attempts.  Patient will benefit from medication management as well as CBT.  Plan as noted below. Medication management and Plan as noted below. Plan For generalized anxiety disorder Patient completed a GAD 7-scored 6.  She however reports significant symptoms related to her stressors. Patient will  benefit from CBT.  Will refer her for the same. Start Pamelor 25 mg p.o. nightly. Add hydroxyzine 25 mg p.o. 3 times daily as needed  For insomnia Her sleep problems are due to her pain as well as anxiety. Pamelor will help with the same.  I have reviewed EKG in [facility] R dated 01/02/2018-within normal limits  I have reviewed labs-thyroid panel -08/31/2015-within normal limits  Follow-up in clinic in 2 weeks or sooner if needed.  More than 50 % of the time was spent for psychoeducation and supportive psychotherapy and care coordination.  This note was generated in part or whole with voice recognition software. Voice recognition is usually quite accurate but there are transcription errors that can and very often do occur. I apologize for any typographical errors that were not detected and corrected.      Ursula Alert, MD 10/23/20191:11 PM

## 2018-04-30 NOTE — Patient Instructions (Signed)
Hydroxyzine capsules or tablets What is this medicine? HYDROXYZINE (hye [place] i zeen) is an antihistamine. This medicine is used to treat allergy symptoms. It is also used to treat anxiety and tension. This medicine can be used with other medicines to induce sleep before surgery. This medicine may be used for other purposes; ask your health care provider or pharmacist if you have questions. COMMON BRAND NAME(S): ANX, Atarax, Rezine, Vistaril What should I tell my health care provider before I take this medicine? They need to know if you have any of these conditions: -any chronic illness -difficulty passing urine -glaucoma -heart disease -kidney disease -liver disease -lung disease -an unusual or allergic reaction to hydroxyzine, cetirizine, other medicines, foods, dyes, or preservatives -pregnant or trying to get pregnant -breast-feeding How should I use this medicine? Take this medicine by mouth with a full glass of water. Follow the directions on the prescription label. You may take this medicine with food or on an empty stomach. Take your medicine at regular intervals. Do not take your medicine more often than directed. Talk to your pediatrician regarding the use of this medicine in children. Special care may be needed. While this drug may be prescribed for children as young as 75 years of age for selected conditions, precautions do apply. Patients over 62 years old may have a stronger reaction and need a smaller dose. Overdosage: If you think you have taken too much of this medicine contact a poison control center or emergency room at once. NOTE: This medicine is only for you. Do not share this medicine with others. What if I miss a dose? If you miss a dose, take it as soon as you can. If it is almost time for your next dose, take only that dose. Do not take double or extra doses. What may interact with this medicine? -alcohol -barbiturate medicines for sleep or seizures -medicines for  colds, allergies -medicines for depression, anxiety, or emotional disturbances -medicines for pain -medicines for sleep -muscle relaxants This list may not describe all possible interactions. Give your health care provider a list of all the medicines, herbs, non-prescription drugs, or dietary supplements you use. Also tell them if you smoke, drink alcohol, or use illegal drugs. Some items may interact with your medicine. What should I watch for while using this medicine? Tell your doctor or health care professional if your symptoms do not improve. You may get drowsy or dizzy. Do not drive, use machinery, or do anything that needs mental alertness until you know how this medicine affects you. Do not stand or sit up quickly, especially if you are an older patient. This reduces the risk of dizzy or fainting spells. Alcohol may interfere with the effect of this medicine. Avoid alcoholic drinks. Your mouth may get dry. Chewing sugarless gum or sucking hard candy, and drinking plenty of water may help. Contact your doctor if the problem does not go away or is severe. This medicine may cause dry eyes and blurred vision. If you wear contact lenses you may feel some discomfort. Lubricating drops may help. See your eye doctor if the problem does not go away or is severe. If you are receiving skin tests for allergies, tell your doctor you are using this medicine. What side effects may I notice from receiving this medicine? Side effects that you should report to your doctor or health care professional as soon as possible: -fast or irregular heartbeat -difficulty passing urine -seizures -slurred speech or confusion -tremor Side effects that  usually do not require medical attention (report to your doctor or health care professional if they continue or are bothersome): -constipation -drowsiness -fatigue -headache -stomach upset This list may not describe all possible side effects. Call your doctor for  medical advice about side effects. You may report side effects to FDA at 1-800-FDA-1088. Where should I keep my medicine? Keep out of the reach of children. Store at room temperature between 15 and 30 degrees C (59 and 86 degrees F). Keep container tightly closed. Throw away any unused medicine after the expiration date. NOTE: This sheet is a summary. It may not cover all possible information. If you have questions about this medicine, talk to your doctor, pharmacist, or health care provider.  2018 Elsevier/Gold Standard (2007-11-07 14:50:59) Nortriptyline capsules What is this medicine? NORTRIPTYLINE (nor TRIP ti leen) is used to treat depression. This medicine may be used for other purposes; ask your health care provider or pharmacist if you have questions. COMMON BRAND NAME(S): Aventyl, Pamelor What should I tell my health care provider before I take this medicine? They need to know if you have any of these conditions: -an alcohol problem -bipolar disorder or schizophrenia -difficulty passing urine, prostate trouble -glaucoma -heart disease or recent heart attack -liver disease -over active thyroid -seizures -thoughts or plans of suicide or a previous suicide attempt or family history of suicide attempt -an unusual or allergic reaction to nortriptyline, other medicines, foods, dyes, or preservatives -pregnant or trying to get pregnant -breast-feeding How should I use this medicine? Take this medicine by mouth with a glass of water. Follow the directions on the prescription label. Take your doses at regular intervals. Do not take it more often than directed. Do not stop taking this medicine suddenly except upon the advice of your doctor. Stopping this medicine too quickly may cause serious side effects or your condition may worsen. A special MedGuide will be given to you by the pharmacist with each prescription and refill. Be sure to read this information carefully each time. Talk to  your pediatrician regarding the use of this medicine in children. Special care may be needed. Overdosage: If you think you have taken too much of this medicine contact a poison control center or emergency room at once. NOTE: This medicine is only for you. Do not share this medicine with others. What if I miss a dose? If you miss a dose, take it as soon as you can. If it is almost time for your next dose, take only that dose. Do not take double or extra doses. What may interact with this medicine? Do not take this medicine with any of the following medications: -arsenic trioxide -certain medicines medicines for irregular heart beat -cisapride -halofantrine -linezolid -MAOIs like Carbex, Eldepryl, Marplan, Nardil, and Parnate -methylene blue (injected into a vein) -other medicines for mental depression -phenothiazines like perphenazine, thioridazine and chlorpromazine -pimozide -probucol -procarbazine -sparfloxacin -St. John's Wort -ziprasidone This medicine may also interact with any of the following medications: -atropine and related drugs like hyoscyamine, scopolamine, tolterodine and others -barbiturate medicines for inducing sleep or treating seizures, such as phenobarbital -cimetidine -medicines for diabetes -medicines for seizures like carbamazepine or phenytoin -reserpine -thyroid medicine This list may not describe all possible interactions. Give your health care provider a list of all the medicines, herbs, non-prescription drugs, or dietary supplements you use. Also tell them if you smoke, drink alcohol, or use illegal drugs. Some items may interact with your medicine. What should I watch for while using this  medicine? Tell your doctor if your symptoms do not get better or if they get worse. Visit your doctor or health care professional for regular checks on your progress. Because it may take several weeks to see the full effects of this medicine, it is important to continue  your treatment as prescribed by your doctor. Patients and their families should watch out for new or worsening thoughts of suicide or depression. Also watch out for sudden changes in feelings such as feeling anxious, agitated, panicky, irritable, hostile, aggressive, impulsive, severely restless, overly excited and hyperactive, or not being able to sleep. If this happens, especially at the beginning of treatment or after a change in dose, call your health care professional. Dennis Bast may get drowsy or dizzy. Do not drive, use machinery, or do anything that needs mental alertness until you know how this medicine affects you. Do not stand or sit up quickly, especially if you are an older patient. This reduces the risk of dizzy or fainting spells. Alcohol may interfere with the effect of this medicine. Avoid alcoholic drinks. Do not treat yourself for coughs, colds, or allergies without asking your doctor or health care professional for advice. Some ingredients can increase possible side effects. Your mouth may get dry. Chewing sugarless gum or sucking hard candy, and drinking plenty of water may help. Contact your doctor if the problem does not go away or is severe. This medicine may cause dry eyes and blurred vision. If you wear contact lenses you may feel some discomfort. Lubricating drops may help. See your eye doctor if the problem does not go away or is severe. This medicine can cause constipation. Try to have a bowel movement at least every 2 to 3 days. If you do not have a bowel movement for 3 days, call your doctor or health care professional. This medicine can make you more sensitive to the sun. Keep out of the sun. If you cannot avoid being in the sun, wear protective clothing and use sunscreen. Do not use sun lamps or tanning beds/booths. What side effects may I notice from receiving this medicine? Side effects that you should report to your doctor or health care professional as soon as  possible: -allergic reactions like skin rash, itching or hives, swelling of the face, lips, or tongue -anxious -breathing problems -changes in vision -confusion -elevated mood, decreased need for sleep, racing thoughts, impulsive behavior -eye pain -fast, irregular heartbeat -feeling faint or lightheaded, falls -feeling agitated, angry, or irritable -fever with increased sweating -hallucination, loss of contact with reality -seizures -stiff muscles -suicidal thoughts or other mood changes -tingling, pain, or numbness in the feet or hands -trouble passing urine or change in the amount of urine -trouble sleeping -unusually weak or tired -vomiting -yellowing of the eyes or skin Side effects that usually do not require medical attention (report to your doctor or health care professional if they continue or are bothersome): -change in sex drive or performance -change in appetite or weight -constipation -dizziness -dry mouth -nausea -tired -tremors -upset stomach This list may not describe all possible side effects. Call your doctor for medical advice about side effects. You may report side effects to FDA at 1-800-FDA-1088. Where should I keep my medicine? Keep out of the reach of children. Store at room temperature between 15 and 30 degrees C (59 and 86 degrees F). Keep container tightly closed. Throw away any unused medicine after the expiration date. NOTE: This sheet is a summary. It may not cover all possible  information. If you have questions about this medicine, talk to your doctor, pharmacist, or health care provider.  2018 Elsevier/Gold Standard (2015-11-25 12:53:08)

## 2018-05-12 ENCOUNTER — Ambulatory Visit: Payer: 59 | Admitting: Licensed Clinical Social Worker

## 2018-05-13 ENCOUNTER — Encounter: Payer: Self-pay | Admitting: Licensed Clinical Social Worker

## 2018-05-13 ENCOUNTER — Ambulatory Visit (INDEPENDENT_AMBULATORY_CARE_PROVIDER_SITE_OTHER): Payer: 59 | Admitting: Licensed Clinical Social Worker

## 2018-05-13 DIAGNOSIS — F411 Generalized anxiety disorder: Secondary | ICD-10-CM | POA: Diagnosis not present

## 2018-05-13 NOTE — Progress Notes (Signed)
Comprehensive Clinical Assessment (CCA) Note  05/13/2018 Maureen Randall [id number]  Visit Diagnosis:      ICD-10-CM   1. GAD (generalized anxiety disorder) F41.1       CCA Part One  Part One has been completed on paper by the patient.  (See scanned document in Chart Review)  CCA Part Two A  Intake/Chief Complaint:  CCA Intake With Chief Complaint CCA Part Two Date: 05/13/18 CCA Part Two Time: 2 Chief Complaint/Presenting Problem: "Just talking with my primary doctor and seeing the doctor here, they thought it would be a good idea to come talk to you. I feel like I have a lot of stress and anxiety. I worry about a lot of things. I just want to get some stuff off my chest. I was having a really hard time sleeping."  Patients Currently Reported Symptoms/Problems: "Feeling really anxious, stressed. I don't sleep well. I'm worrying a lot. I'm really irritable, I get really snippy. Sometimes I just need to get away from people. I just need to be by myself sometimes. I'm always thinking. My brain is always going 100 miles per hour."  Collateral Involvement: N/A Individual's Strengths: "Time management and organization. I'm veyr detailed. I don't want to say OCD, but I kid about it. I'm very paticular."  Individual's Preferences: N/A Individual's Abilities: Good communication, good insight  Type of Services Patient Feels Are Needed: Medication management, individual therapy  Initial Clinical Notes/Concerns: Pt was visibly anxious during assessment.   Mental Health Symptoms Depression:  Depression: N/A  Mania:  Mania: N/A  Anxiety:   Anxiety: Difficulty concentrating, Fatigue, Irritability, Restlessness, Sleep, Tension, Worrying  Psychosis:  Psychosis: N/A  Trauma:  Trauma: N/A  Obsessions:  Obsessions: N/A  Compulsions:  Compulsions: N/A  Inattention:  Inattention: N/A  Hyperactivity/Impulsivity:  Hyperactivity/Impulsivity: N/A  Oppositional/Defiant Behaviors:  Oppositional/Defiant  Behaviors: N/A  Borderline Personality:  Emotional Irregularity: N/A  Other Mood/Personality Symptoms:  Other Mood/Personality Symtpoms: Pt reports worrying constantly about getting Dementia because it runs in her family. Pt's speech was somewhat pressured during assessment.    Mental Status Exam Appearance and self-care  Stature:  Stature: Average  Weight:  Weight: Overweight  Clothing:  Clothing: Casual  Grooming:  Grooming: Normal  Cosmetic use:  Cosmetic Use: Age appropriate  Posture/gait:  Posture/Gait: Normal  Motor activity:  Motor Activity: Not Remarkable  Sensorium  Attention:  Attention: Normal  Concentration:     Orientation:  Orientation: X5  Recall/memory:  Recall/Memory: Normal  Affect and Mood  Affect:  Affect: Anxious  Mood:  Mood: Anxious  Relating  Eye contact:  Eye Contact: Fleeting  Facial expression:  Facial Expression: Anxious  Attitude toward examiner:  Attitude Toward Examiner: Cooperative  Thought and Language  Speech flow: Speech Flow: Normal  Thought content:  Thought Content: Appropriate to mood and circumstances  Preoccupation:  Preoccupations: (N/A)  Hallucinations:  Hallucinations: (N/A)  Organization:     Transport planner of Knowledge:  Fund of Knowledge: Average  Intelligence:  Intelligence: Average  Abstraction:  Abstraction: Normal  Judgement:  Judgement: Normal  Reality Testing:  Reality Testing: Realistic  Insight:  Insight: Good  Decision Making:  Decision Making: Normal  Social Functioning  Social Maturity:  Social Maturity: Responsible  Social Judgement:  Social Judgement: Normal  Stress  Stressors:  Stressors: Illness, Work  Coping Ability:  Coping Ability: Normal  Skill Deficits:     Supports:      Family and Psychosocial History: Family history Marital status:  Married Number of Years Married: 10 What types of issues is patient dealing with in the relationship?: "Yes and no. We have issues but we're not really  dealing with them. He's very stubborn. I think there was some infedelity on his part. Trust issues and communication issues."  Additional relationship information: 2nd marriage. Divorced from first husband when pt was 22.  Are you sexually active?: Yes What is your sexual orientation?: Heterosexual  Has your sexual activity been affected by drugs, alcohol, medication, or emotional stress?: "it's slowed down since my surgery."  Does patient have children?: Yes How many children?: 1 How is patient's relationship with their children?: 1 daughter age 34, "really good. We've gone through really rough times, but right now we're really good."   Childhood History:  Childhood History By whom was/is the patient raised?: Both parents Additional childhood history information: "Good."  Description of patient's relationship with caregiver when they were a child: Dad: "I was daddy's girl." Mom: "We butt heads."  Patient's description of current relationship with people who raised him/her: Dad is deceased. Mom: "It's good. We still butt heads sometimes. I feel unappreciated by her."  How were you disciplined when you got in trouble as a child/adolescent?: "I got punished like I wasn't allowed out. Or, I wasn't allowed to use the phone. I didn't get beatings or anything like that."  Does patient have siblings?: Yes Number of Siblings: 1 Description of patient's current relationship with siblings: one brother, "it's good."  Did patient suffer any verbal/emotional/physical/sexual abuse as a child?: No Did patient suffer from severe childhood neglect?: No Has patient ever been sexually abused/assaulted/raped as an adolescent or adult?: No Was the patient ever a victim of a crime or a disaster?: No Witnessed domestic violence?: No Has patient been effected by domestic violence as an adult?: No  CCA Part Two B  Employment/Work Situation: Employment / Work Copywriter, advertising Employment situation: Employed 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  Did You Receive Any Psychiatric Treatment/Services While in Passenger transport manager?: (N/A) Are There Guns or Other Weapons in [place]?: Yes Types of Guns/Weapons: gun  Are These Psychologist, educational?: Yes  Education: Education School Currently Attending: N/A Last Grade Completed: 12 Name of [place]: [place]  Did Teacher, adult education From [employer]?: Yes Did Physicist, medical?: No Did Heritage manager?: No Did You Have Any Special Interests In School?: [employer] school  Did You Have An Individualized Education Program (IIEP): No Did You Have Any Difficulty At [employer]?: No  Religion: Religion/Spirituality Are You A Religious Person?: No How Might This Affect Treatment?: N/A  Leisure/Recreation: Leisure / Recreation Leisure and Hobbies: "Ride my motorcycle and I love to travel."   Exercise/Diet: Exercise/Diet Do You Exercise?: No Have You Gained or Lost A Significant Amount of Weight in the Past Six Months?: Yes-Gained Number of Pounds Gained: 17 Do You Follow a Special Diet?: No Do You Have Any Trouble Sleeping?: Yes Explanation of Sleeping Difficulties: Falling asleep ("because the pain or my brain is going."). "I usually wake up all night too."   CCA Part Two C  Alcohol/Drug Use: Alcohol / Drug Use Pain Medications: SEE MAR Prescriptions: SEE MAR Over the Counter: SEE MAR History of alcohol /  drug use?: No history of alcohol / drug abuse                      CCA Part Three  ASAM's:  Six Dimensions of Multidimensional Assessment  Dimension 1:  Acute Intoxication and/or Withdrawal Potential:     Dimension 2:  Biomedical Conditions and Complications:     Dimension 3:  Emotional,  Behavioral, or Cognitive Conditions and Complications:     Dimension 4:  Readiness to Change:     Dimension 5:  Relapse, Continued use, or Continued Problem Potential:     Dimension 6:  Recovery/Living Environment:      Substance use Disorder (SUD) Substance Use Disorder (SUD)  Checklist Symptoms of Substance Use: (N/A)  Social Function:  Social Functioning Social Maturity: Responsible Social Judgement: Normal  Stress:  Stress Stressors: Illness, Work Coping Ability: Normal Patient Takes Medications The Way The Doctor Instructed?: Yes Priority Risk: Low Acuity  Risk Assessment- Self-Harm Potential: Risk Assessment For Self-Harm Potential Thoughts of Self-Harm: No current thoughts Method: No plan Availability of Means: No access/NA Additional Comments for Self-Harm Potential: N/A  Risk Assessment -Dangerous to Others Potential: Risk Assessment For Dangerous to Others Potential Method: No Plan Availability of Means: No access or NA Intent: Vague intent or NA Notification Required: No need or identified person Additional Comments for Danger to Others Potential: N/A  DSM5 Diagnoses: Patient Active Problem List   Diagnosis Date Noted  . IUD threads lost 04/30/2018  . 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 History of lumbar fusion 01/29/2018  . Primary osteoarthritis of one hip, right 12/20/2017  . Long term current use of opiate analgesic 08/31/2015  . Chronic pain syndrome 08/31/2015  . Bilateral carpal tunnel syndrome 08/31/2015  . Arthralgia of multiple joints 08/31/2015  . Increased band cell count 08/31/2015  . Bilateral lumbar radiculopathy 07/27/2014  . Degenerative spondylolisthesis 07/27/2014  . Facet arthropathy, lumbosacral 07/27/2014  . 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 Anxiety state 10/21/2009    Patient Centered Plan: Patient is on the following Treatment Plan(s):  Anxiety  Recommendations for  Services/Supports/Treatments: Recommendations for Services/Supports/Treatments Recommendations For Services/Supports/Treatments: Individual Therapy, Medication Management  Treatment Plan Summary:  Wrenn was able to speak openly and honestly about her anxiety and how it has been affecting her life. She reports her symptoms have decreased recently because she received word that, despite going in for another surgery, she will have her job when she returns in February. I provided Maryan with a thought record to complete prior to our next session. We will continue to utilize CBT in order to manage [facility] anxiety symptoms.   Referrals to Alternative Service(s): Referred to Alternative Service(s):   Place:   Date:   Time:    Referred to Alternative Service(s):   Place:   Date:   Time:    Referred to Alternative Service(s):   Place:   Date:   Time:    Referred to Alternative Service(s):   Place:   Date:   Time:     Alden Hipp, LCSW

## 2018-05-14 ENCOUNTER — Other Ambulatory Visit: Payer: Self-pay

## 2018-05-14 ENCOUNTER — Ambulatory Visit (INDEPENDENT_AMBULATORY_CARE_PROVIDER_SITE_OTHER): Payer: 59 | Admitting: Psychiatry

## 2018-05-14 ENCOUNTER — Encounter: Payer: Self-pay | Admitting: Psychiatry

## 2018-05-14 VITALS — BP 121/78 | HR 81 | Temp 97.9°F | Wt 216.2 lb

## 2018-05-14 DIAGNOSIS — G4701 Insomnia due to medical condition: Secondary | ICD-10-CM | POA: Diagnosis not present

## 2018-05-14 DIAGNOSIS — F411 Generalized anxiety disorder: Secondary | ICD-10-CM

## 2018-05-14 MED ORDER — TRAZODONE HCL 100 MG PO TABS: 50.0000 mg | ORAL_TABLET | Freq: Every evening | ORAL | 1 refills | Status: DC | PRN

## 2018-05-14 MED ORDER — DULOXETINE HCL 30 MG PO CPEP: 30.0000 mg | ORAL_CAPSULE | Freq: Every day | ORAL | 1 refills | Status: DC

## 2018-05-14 NOTE — Progress Notes (Signed)
[place] MD OP Progress Note  05/14/2018 1:30 PM Maureen Randall  MRN:  [id number]  Chief Complaint: ' I am here for follow up."  Chief Complaint    Follow-up; Medication Refill     HPI: Maureen Randall is a 52 yr old female, presented to the clinic today for a follow-up visit.  Patient has a history of generalized anxiety disorder, insomnia, chronic pain, primary osteoarthritis, lumbar radiculopathy, degenerative spondylolisthesis, bilateral carpal tunnel syndrome.  Patient today reports she did not tolerate the Pamelor well.  She reports she developed side effects to it as well as she could not sleep at all.  She reports it made her stay up all night.  Patient also reports psychosocial stressors of upcoming left hip replacement surgery.  She reports the surgery scheduled for next Tuesday.  She reports she had right hip surgery in June 2019.  She reports it continues to give her trouble and she also struggles with chronic pain.  Patient reports she is hoping this will help her with her pain.  She is currently on disability until February 2019 from her job.  Patient reports she continues to worry a lot.  Reports symptoms like nervousness, anxiety, feeling on edge, inability to stop worrying, worrying too much about different things and so on.  Patient continues to have good social support from her family.  Patient denies any suicidality.  Patient denies any perceptual disturbances. Visit Diagnosis:    ICD-10-CM   1. GAD (generalized anxiety disorder) F41.1 DULoxetine (CYMBALTA) 30 MG capsule    traZODone (DESYREL) 100 MG tablet  2. Insomnia due to medical condition G47.01 traZODone (DESYREL) 100 MG tablet    Past Psychiatric History: I have reviewed past psychiatric history from my progress note on 04/30/2018.  Past trials of Xanax, diazepam, clonazepam, pamelor.  Past Medical History:  Past Medical History:  Diagnosis Date  . Allergy   . Anxiety   . Arthritis    osteoarthritis    Past Surgical  History:  Procedure Laterality Date  . BREAST BIOPSY Left    neg  . CARPAL TUNNEL RELEASE Right 12/2015   [facility]  . CARPAL TUNNEL RELEASE Left 12/2016  . FOOT SURGERY Right 2015  . HIP ARTHROSCOPY Right 05/2011  . REPLACEMENT TOTAL HIP W/  RESURFACING IMPLANTS Right 12/2017   [place] Right 12/2015   [facility]  . SPINAL FUSION  2016   [facility]    Family Psychiatric History: Have reviewed family psychiatric history from my progress note on 04/30/2018  Family History:  Family History  Problem Relation Age of Onset  . Breast cancer Maternal Aunt   . Breast cancer Paternal Aunt     Social History: I have reviewed social history from my progress note on 04/30/2018 Social History   Socioeconomic History  . Marital status: Married    Spouse name: fredrick  . Number of children: 1  . Years of education: Not on file  . Highest education level: High school graduate  Occupational History    Comment: full time  Social Needs  . Financial resource strain: Somewhat hard  . Food insecurity:    Worry: Sometimes true    Inability: Sometimes true  . Transportation needs:    Medical: No    Non-medical: No  Tobacco Use  . Smoking status: Never Smoker  . Smokeless tobacco: Never Used  Substance and Sexual Activity  . Alcohol use: Not Currently  . Drug use: Never  .  Sexual activity: Yes  Lifestyle  . Physical activity:    Days per week: 0 days    Minutes per session: 0 min  . Stress: Very much  Relationships  . Social connections:    Talks on phone: Not on file    Gets together: Not on file    Attends religious service: Never    Active member of club or organization: No    Attends meetings of clubs or organizations: Never    Relationship status: Married  Other Topics Concern  . Not on file  Social History Narrative  . Not on file    Allergies:  Allergies  Allergen Reactions  . Cephalexin     rash  . Septra  [Sulfamethoxazole-Trimethoprim]     hives    Metabolic Disorder Labs: No results found for: HGBA1C, MPG No results found for: PROLACTIN No results found for: CHOL, TRIG, HDL, CHOLHDL, VLDL, LDLCALC No results found for: TSH  Therapeutic Level Labs: No results found for: LITHIUM No results found for: VALPROATE No components found for:  CBMZ  Current Medications: Current Outpatient Medications  Medication Sig Dispense Refill  . aspirin EC 81 MG tablet 1 twice a day with food for 4 weeks.  Do not start until eliquis completed    . clindamycin (CLEOCIN) 300 MG capsule TK 2 CS PO ONE HOUR PRIOR TO PROCEDURE  3  . Cyanocobalamin (B-12 COMPLIANCE INJECTION IJ) Inject as directed every 3 (three) months.    . diclofenac sodium (VOLTAREN) 1 % GEL   11  . Elastic Bandages & Supports (RELIEF KNEE) MISC Apply in the morning and remove at night.    . estradiol (ESTRACE) 0.1 MG/GM vaginal cream INSERT PEA SIZED AMOUNT (0.5G) INTO VAGINA NIGHTLY EVERY OTHER NIGHT FOR 2 WEEKS, THEN TWICE WEEKLY    . fluticasone (FLONASE) 50 MCG/ACT nasal spray Place into both nostrils daily.    [name] gabapentin (NEURONTIN) 300 MG capsule TK 1 C PO BID P    . hydrOXYzine (VISTARIL) 25 MG capsule Take 1 capsule (25 mg total) by mouth 3 (three) times daily as needed for anxiety. Only for severe anxiety symptoms 90 capsule 0  . methocarbamol (ROBAXIN) 500 MG tablet Take 500 mg by mouth every 8 (eight) hours as needed for muscle spasms.    . Multiple Vitamin (MULTI-VITAMINS) TABS Take by mouth.    . Naproxen-Esomeprazole (VIMOVO PO) Take 500 mg by mouth 2 (two) times daily as needed.    . nitrofurantoin (MACRODANTIN) 50 MG capsule Take 50 mg by mouth 4 (four) times daily as needed.    [name] oxyCODONE-acetaminophen (PERCOCET) 7.5-325 MG tablet Take 1 tablet by mouth every 8 (eight) hours as needed for severe pain.    . valACYclovir (VALTREX) 500 MG tablet Take 500 mg by mouth as needed.    . DULoxetine (CYMBALTA) 30 MG capsule  Take 1-2 capsules (30-60 mg total) by mouth daily. Take 1 capsule for 2 weeks and increase to 2 capsules 60 capsule 1  . ibuprofen (ADVIL,MOTRIN) 800 MG tablet TK 1 T PO Q 8 H WF PRN P  2  . traZODone (DESYREL) 100 MG tablet Take 0.5-1 tablets (50-100 mg total) by mouth at bedtime as needed for sleep. 30 tablet 1  . VIMOVO 500-20 MG TBEC Take 1 tablet by mouth 2 (two) times daily.  2   No current facility-administered medications for this visit.      Musculoskeletal: Strength & Muscle Tone: within normal limits Gait & Station: normal Patient  leans: N/A  Psychiatric Specialty Exam: Review of Systems  Musculoskeletal: Positive for back pain and joint pain.  Psychiatric/Behavioral: The patient is nervous/anxious and has insomnia.   All other systems reviewed and are negative.   Blood pressure 121/78, pulse 81, temperature 97.9 F (36.6 C), temperature source Oral, weight 216 lb 3.2 oz (98.1 kg).Body mass index is 33.86 kg/m.  General Appearance: Casual  Eye Contact:  Fair  Speech:  Clear and Coherent  Volume:  Normal  Mood:  Anxious  Affect:  Congruent  Thought Process:  Goal Directed and Descriptions of Associations: Intact  Orientation:  Full (Time, Place, and Person)  Thought Content: Logical   Suicidal Thoughts:  No  Homicidal Thoughts:  No  Memory:  Immediate;   Fair Recent;   Fair Remote;   Fair  Judgement:  Fair  Insight:  Fair  Psychomotor Activity:  Normal  Concentration:  Concentration: Fair and Attention Span: Fair  Recall:  [employer] of Knowledge: Fair  Language: Fair  Akathisia:  No  Handed:  Right  AIMS (if indicated):na  Assets:  Communication Skills Desire for Improvement Social Support  ADL's:  Intact  Cognition: WNL  Sleep:  Poor   Screenings: PHQ2-9     Clinical Support from 02/11/2018 in [facility] Office Visit from 01/29/2018 in [facility]  PHQ-2 Total  Score  0  0       Assessment and Plan: Shavaun is a 52 year old Caucasian female, married, employed, currently on short-term disability from her job, lives in [place], has a history of chronic pain syndrome, history of multiple surgeries, recent right-sided hip replacement, primary osteoarthritis, degenerative spondylolisthesis, lumbar radiculopathy, bilateral carpal tunnel syndrome, upcoming left hip surgery, presented to the clinic today for a follow-up visit.  Patient also has psychosocial stressors of physical limitations, financial stressors and so on.  Patient however has good social support system.  She continues to struggle with anxiety and sleep problems and did not tolerate the Pamelor well.  She has started psychotherapy sessions which is going well.  Plan as noted below.  Plan For generalized anxiety disorder Discontinue Pamelor for lack of efficacy. Start Cymbalta 30 mg p.o. daily for the next 2 weeks and increase to 30 mg p.o. twice daily if she tolerates it well. Hydroxyzine 25 mg p.o. 3 times daily as needed Continue CBT  For insomnia Discontinue Pamelor.  Her sleep problems are also due to her pain as well as anxiety symptoms. Start trazodone 50-100 mg p.o. nightly as needed  Follow-up in clinic in 4 weeks or sooner if needed.  More than 50 % of the time was spent for psychoeducation and supportive psychotherapy and care coordination.  This note was generated in part or whole with voice recognition software. Voice recognition is usually quite accurate but there are transcription errors that can and very often do occur. I apologize for any typographical errors that were not detected and corrected.       Ursula Alert, MD 05/15/2018, 10:27 AM

## 2018-05-14 NOTE — Patient Instructions (Signed)
Trazodone tablets What is this medicine? TRAZODONE (TRAZ oh done) is used to treat depression. This medicine may be used for other purposes; ask your health care provider or pharmacist if you have questions. COMMON BRAND NAME(S): Desyrel What should I tell my health care provider before I take this medicine? They need to know if you have any of these conditions: -attempted suicide or thinking about it -bipolar disorder -bleeding problems -glaucoma -heart disease, or previous heart attack -irregular heart beat -kidney or liver disease -low levels of sodium in the blood -an unusual or allergic reaction to trazodone, other medicines, foods, dyes or preservatives -pregnant or trying to get pregnant -breast-feeding How should I use this medicine? Take this medicine by mouth with a glass of water. Follow the directions on the prescription label. Take this medicine shortly after a meal or a light snack. Take your medicine at regular intervals. Do not take your medicine more often than directed. Do not stop taking this medicine suddenly except upon the advice of your doctor. Stopping this medicine too quickly may cause serious side effects or your condition may worsen. A special MedGuide will be given to you by the pharmacist with each prescription and refill. Be sure to read this information carefully each time. Talk to your pediatrician regarding the use of this medicine in children. Special care may be needed. Overdosage: If you think you have taken too much of this medicine contact a poison control center or emergency room at once. NOTE: This medicine is only for you. Do not share this medicine with others. What if I miss a dose? If you miss a dose, take it as soon as you can. If it is almost time for your next dose, take only that dose. Do not take double or extra doses. What may interact with this medicine? Do not take this medicine with any of the following medications: -certain medicines  for fungal infections like fluconazole, itraconazole, ketoconazole, posaconazole, voriconazole -cisapride -dofetilide -dronedarone -linezolid -MAOIs like Carbex, Eldepryl, Marplan, Nardil, and Parnate -mesoridazine -methylene blue (injected into a vein) -pimozide -saquinavir -thioridazine -ziprasidone This medicine may also interact with the following medications: -alcohol -antiviral medicines for HIV or AIDS -aspirin and aspirin-like medicines -barbiturates like phenobarbital -certain medicines for blood pressure, heart disease, irregular heart beat -certain medicines for depression, anxiety, or psychotic disturbances -certain medicines for migraine headache like almotriptan, eletriptan, frovatriptan, naratriptan, rizatriptan, sumatriptan, zolmitriptan -certain medicines for seizures like carbamazepine and phenytoin -certain medicines for sleep -certain medicines that treat or prevent blood clots like dalteparin, enoxaparin, warfarin -digoxin -fentanyl -lithium -NSAIDS, medicines for pain and inflammation, like ibuprofen or naproxen -other medicines that prolong the QT interval (cause an abnormal heart rhythm) -rasagiline -supplements like St. John's wort, kava kava, valerian -tramadol -tryptophan This list may not describe all possible interactions. Give your health care provider a list of all the medicines, herbs, non-prescription drugs, or dietary supplements you use. Also tell them if you smoke, drink alcohol, or use illegal drugs. Some items may interact with your medicine. What should I watch for while using this medicine? Tell your doctor if your symptoms do not get better or if they get worse. Visit your doctor or health care professional for regular checks on your progress. Because it may take several weeks to see the full effects of this medicine, it is important to continue your treatment as prescribed by your doctor. Patients and their families should watch out for new  or worsening thoughts of suicide or depression. Also   watch out for sudden changes in feelings such as feeling anxious, agitated, panicky, irritable, hostile, aggressive, impulsive, severely restless, overly excited and hyperactive, or not being able to sleep. If this happens, especially at the beginning of treatment or after a change in dose, call your health care professional. Dennis Bast may get drowsy or dizzy. Do not drive, use machinery, or do anything that needs mental alertness until you know how this medicine affects you. Do not stand or sit up quickly, especially if you are an older patient. This reduces the risk of dizzy or fainting spells. Alcohol may interfere with the effect of this medicine. Avoid alcoholic drinks. This medicine may cause dry eyes and blurred vision. If you wear contact lenses you may feel some discomfort. Lubricating drops may help. See your eye doctor if the problem does not go away or is severe. Your mouth may get dry. Chewing sugarless gum, sucking hard candy and drinking plenty of water may help. Contact your doctor if the problem does not go away or is severe. What side effects may I notice from receiving this medicine? Side effects that you should report to your doctor or health care professional as soon as possible: -allergic reactions like skin rash, itching or hives, swelling of the face, lips, or tongue -elevated mood, decreased need for sleep, racing thoughts, impulsive behavior -confusion -fast, irregular heartbeat -feeling faint or lightheaded, falls -feeling agitated, angry, or irritable -loss of balance or coordination -painful or prolonged erections -restlessness, pacing, inability to keep still -suicidal thoughts or other mood changes -tremors -trouble sleeping -seizures -unusual bleeding or bruising Side effects that usually do not require medical attention (report to your doctor or health care professional if they continue or are bothersome): -change in  sex drive or performance -change in appetite or weight -constipation -headache -muscle aches or pains -nausea This list may not describe all possible side effects. Call your doctor for medical advice about side effects. You may report side effects to FDA at 1-800-FDA-1088. Where should I keep my medicine? Keep out of the reach of children. Store at room temperature between 15 and 30 degrees C (59 to 86 degrees F). Protect from light. Keep container tightly closed. Throw away any unused medicine after the expiration date. NOTE: This sheet is a summary. It may not cover all possible information. If you have questions about this medicine, talk to your doctor, pharmacist, or health care provider.  2018 Elsevier/Gold Standard (2015-11-24 16:57:05) Duloxetine delayed-release capsules What is this medicine? DULOXETINE (doo LOX e teen) is used to treat depression, anxiety, and different types of chronic pain. This medicine may be used for other purposes; ask your health care provider or pharmacist if you have questions. COMMON BRAND NAME(S): Cymbalta, [place] What should I tell my health care provider before I take this medicine? They need to know if you have any of these conditions: -bipolar disorder or a family history of bipolar disorder -glaucoma -kidney disease -liver disease -suicidal thoughts or a previous suicide attempt -taken medicines called MAOIs like Carbex, Eldepryl, Marplan, Nardil, and Parnate within 14 days -an unusual reaction to duloxetine, other medicines, foods, dyes, or preservatives -pregnant or trying to get pregnant -breast-feeding How should I use this medicine? Take this medicine by mouth with a glass of water. Follow the directions on the prescription label. Do not cut, crush or chew this medicine. You can take this medicine with or without food. Take your medicine at regular intervals. Do not take your medicine more often than  directed. Do not stop taking this  medicine suddenly except upon the advice of your doctor. Stopping this medicine too quickly may cause serious side effects or your condition may worsen. A special MedGuide will be given to you by the pharmacist with each prescription and refill. Be sure to read this information carefully each time. Talk to your pediatrician regarding the use of this medicine in children. While this drug may be prescribed for children as young as 90 years of age for selected conditions, precautions do apply. Overdosage: If you think you have taken too much of this medicine contact a poison control center or emergency room at once. NOTE: This medicine is only for you. Do not share this medicine with others. What if I miss a dose? If you miss a dose, take it as soon as you can. If it is almost time for your next dose, take only that dose. Do not take double or extra doses. What may interact with this medicine? Do not take this medicine with any of the following medications: -desvenlafaxine -levomilnacipran -linezolid -MAOIs like Carbex, Eldepryl, Marplan, Nardil, and Parnate -methylene blue (injected into a vein) -milnacipran -thioridazine -venlafaxine This medicine may also interact with the following medications: -alcohol -amphetamines -aspirin and aspirin-like medicines -certain antibiotics like ciprofloxacin and enoxacin -certain medicines for blood pressure, heart disease, irregular heart beat -certain medicines for depression, anxiety, or psychotic disturbances -certain medicines for migraine headache like almotriptan, eletriptan, frovatriptan, naratriptan, rizatriptan, sumatriptan, zolmitriptan -certain medicines that treat or prevent blood clots like warfarin, enoxaparin, and dalteparin -cimetidine -fentanyl -lithium -NSAIDS, medicines for pain and inflammation, like ibuprofen or naproxen -phentermine -procarbazine -rasagiline -sibutramine -St. John's  wort -theophylline -tramadol -tryptophan This list may not describe all possible interactions. Give your health care provider a list of all the medicines, herbs, non-prescription drugs, or dietary supplements you use. Also tell them if you smoke, drink alcohol, or use illegal drugs. Some items may interact with your medicine. What should I watch for while using this medicine? Tell your doctor if your symptoms do not get better or if they get worse. Visit your doctor or health care professional for regular checks on your progress. Because it may take several weeks to see the full effects of this medicine, it is important to continue your treatment as prescribed by your doctor. Patients and their families should watch out for new or worsening thoughts of suicide or depression. Also watch out for sudden changes in feelings such as feeling anxious, agitated, panicky, irritable, hostile, aggressive, impulsive, severely restless, overly excited and hyperactive, or not being able to sleep. If this happens, especially at the beginning of treatment or after a change in dose, call your health care professional. Dennis Bast may get drowsy or dizzy. Do not drive, use machinery, or do anything that needs mental alertness until you know how this medicine affects you. Do not stand or sit up quickly, especially if you are an older patient. This reduces the risk of dizzy or fainting spells. Alcohol may interfere with the effect of this medicine. Avoid alcoholic drinks. This medicine can cause an increase in blood pressure. This medicine can also cause a sudden drop in your blood pressure, which may make you feel faint and increase the chance of a fall. These effects are most common when you first start the medicine or when the dose is increased, or during use of other medicines that can cause a sudden drop in blood pressure. Check with your doctor for instructions on monitoring your  blood pressure while taking this medicine. Your  mouth may get dry. Chewing sugarless gum or sucking hard candy, and drinking plenty of water may help. Contact your doctor if the problem does not go away or is severe. What side effects may I notice from receiving this medicine? Side effects that you should report to your doctor or health care professional as soon as possible: -allergic reactions like skin rash, itching or hives, swelling of the face, lips, or tongue -anxious -breathing problems -confusion -changes in vision -chest pain -confusion -elevated mood, decreased need for sleep, racing thoughts, impulsive behavior -eye pain -fast, irregular heartbeat -feeling faint or lightheaded, falls -feeling agitated, angry, or irritable -hallucination, loss of contact with reality -high blood pressure -loss of balance or coordination -palpitations -redness, blistering, peeling or loosening of the skin, including inside the mouth -restlessness, pacing, inability to keep still -seizures -stiff muscles -suicidal thoughts or other mood changes -trouble passing urine or change in the amount of urine -trouble sleeping -unusual bleeding or bruising -unusually weak or tired -vomiting -yellowing of the eyes or skin Side effects that usually do not require medical attention (report to your doctor or health care professional if they continue or are bothersome): -change in sex drive or performance -change in appetite or weight -constipation -dizziness -dry mouth -headache -increased sweating -nausea -tired This list may not describe all possible side effects. Call your doctor for medical advice about side effects. You may report side effects to FDA at 1-800-FDA-1088. Where should I keep my medicine? Keep out of the reach of children. Store at room temperature between 20 and 25 degrees C (68 to 77 degrees F). Throw away any unused medicine after the expiration date. NOTE: This sheet is a summary. It may not cover all possible  information. If you have questions about this medicine, talk to your doctor, pharmacist, or health care provider.  2018 Elsevier/Gold Standard (2015-11-24 18:16:03)

## 2018-05-15 ENCOUNTER — Encounter: Payer: Self-pay | Admitting: Psychiatry

## 2018-06-19 ENCOUNTER — Other Ambulatory Visit: Payer: Self-pay | Admitting: Obstetrics and Gynecology

## 2018-06-19 DIAGNOSIS — Z1231 Encounter for screening mammogram for malignant neoplasm of breast: Secondary | ICD-10-CM

## 2018-08-05 ENCOUNTER — Ambulatory Visit
Admission: RE | Admit: 2018-08-05 | Discharge: 2018-08-05 | Disposition: A | Discharge status: Home / Self Care | Payer: 59 | Source: Ambulatory Visit | Attending: Obstetrics and Gynecology | Admitting: Obstetrics and Gynecology

## 2018-08-05 DIAGNOSIS — Z1231 Encounter for screening mammogram for malignant neoplasm of breast: Secondary | ICD-10-CM | POA: Insufficient documentation

## 2018-08-05 IMAGING — MG DIGITAL SCREENING BILATERAL MAMMOGRAM WITH TOMO AND CAD
6 of 10 series · 6 of 30 positions shown · non-contrast
Comparison: Previous exam(s).

CLINICAL DATA: Screening.

EXAM:
DIGITAL SCREENING BILATERAL MAMMOGRAM WITH TOMO AND CAD

[R CC synth-2D (1 of 2)]
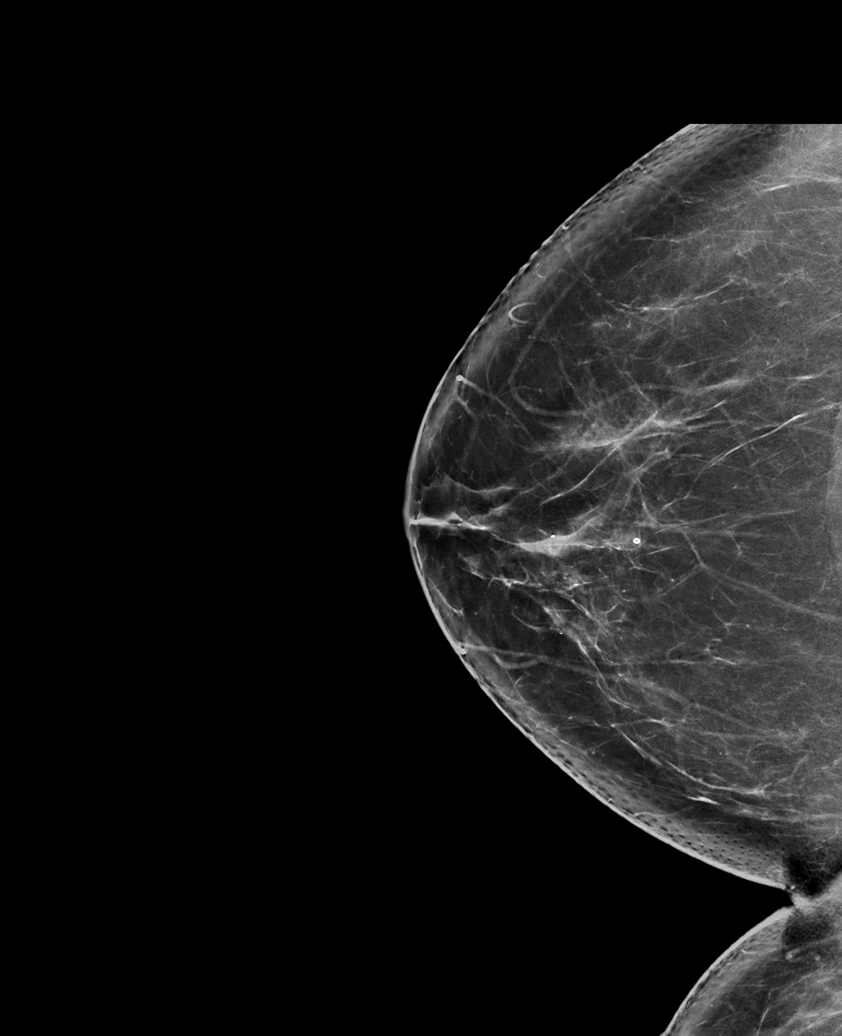

[L CC synth-2D]
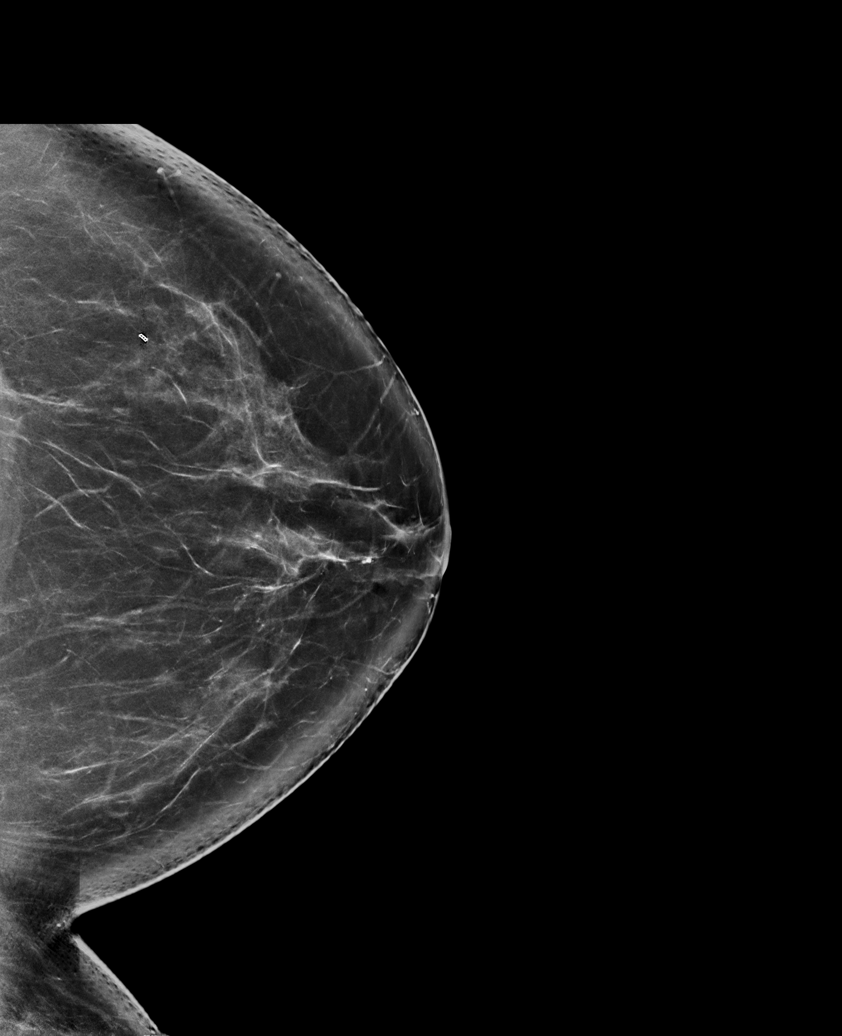

[L MLO synth-2D]
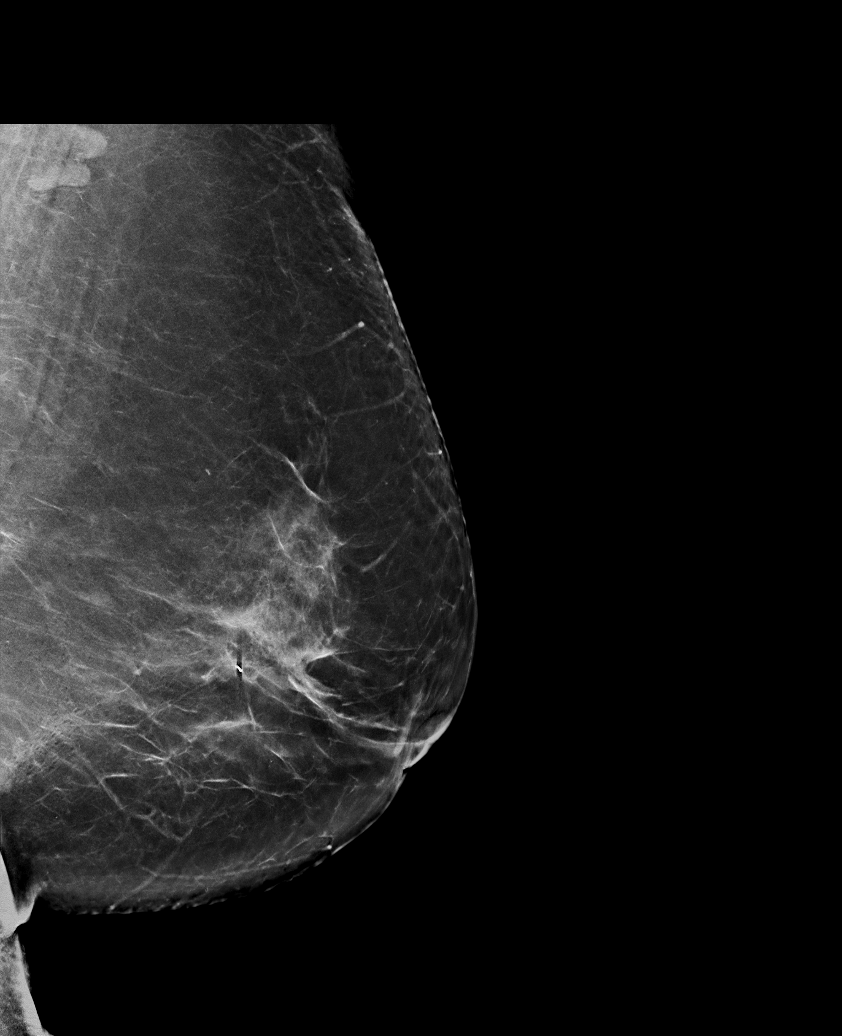

[R MLO synth-2D]
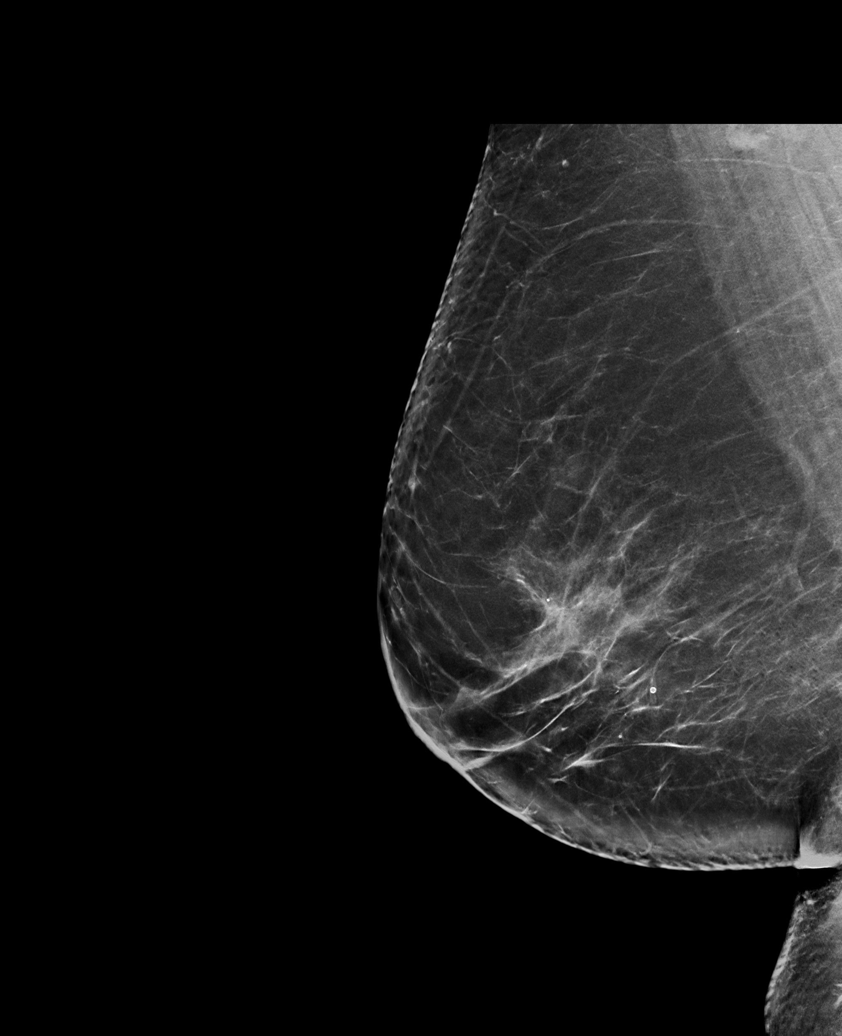

[R CC synth-2D (2 of 2)]
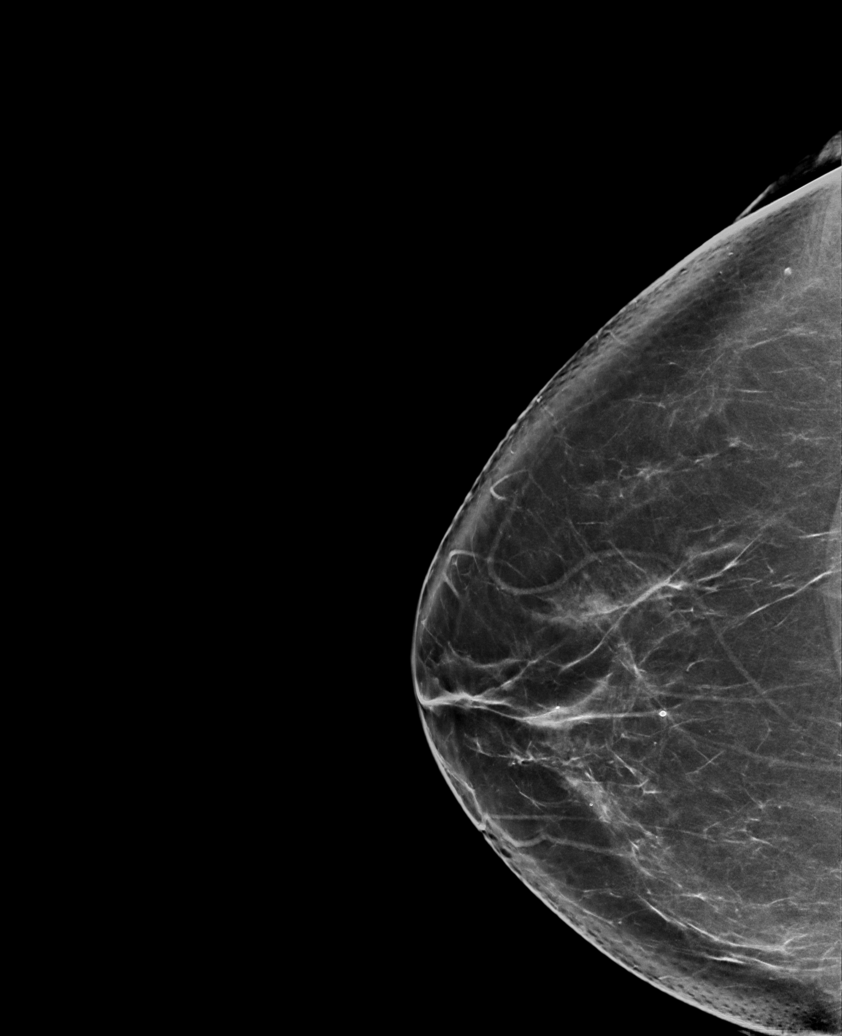

[R CC tomo · tomo slice 45/88.0]
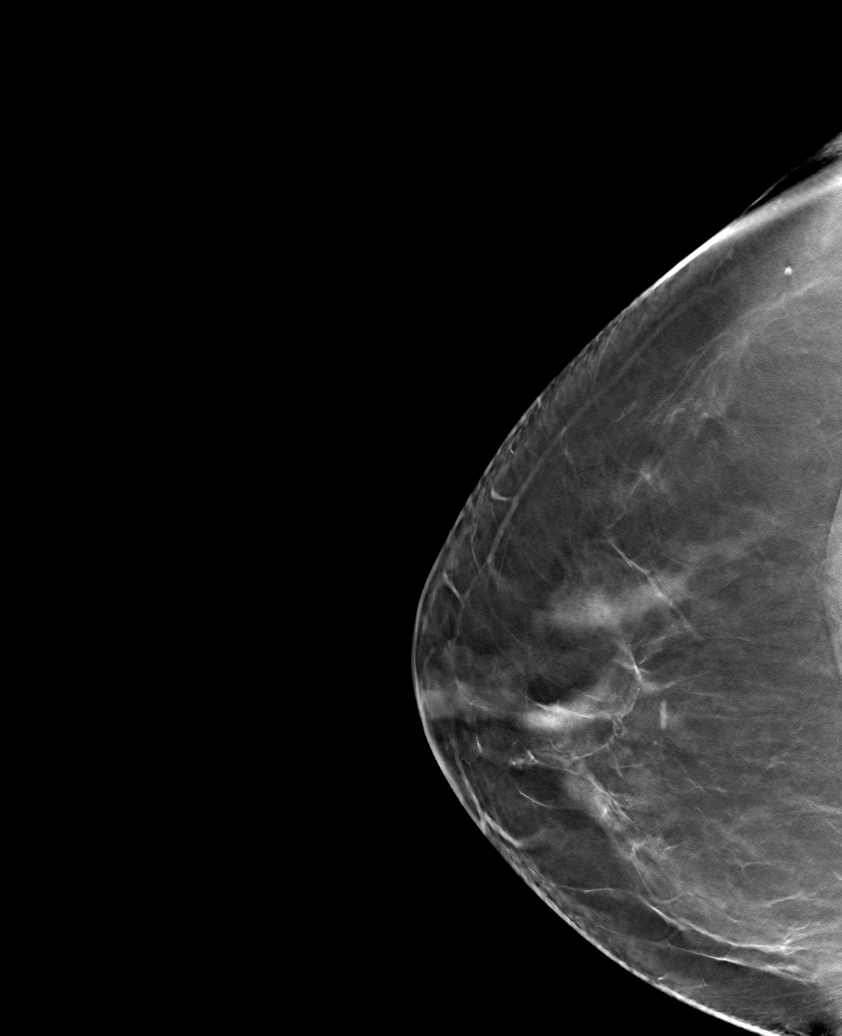

[6 of 30 positions shown; findings below may reference images not displayed]

ACR Breast Density Category b: There are scattered areas of
fibroglandular density.
FINDINGS: There are no findings suspicious for malignancy. Images were
processed with CAD.
IMPRESSION: No mammographic evidence of malignancy. A result letter of this
screening mammogram will be mailed directly to the patient.

RECOMMENDATION:
Screening mammogram in one year. (Code:[TQ])

BI-RADS CATEGORY  1: Negative.

## 2018-08-08 ENCOUNTER — Encounter: Payer: Self-pay | Admitting: Psychiatry

## 2018-08-08 ENCOUNTER — Ambulatory Visit (INDEPENDENT_AMBULATORY_CARE_PROVIDER_SITE_OTHER): Payer: 59 | Admitting: Psychiatry

## 2018-08-08 ENCOUNTER — Other Ambulatory Visit: Payer: Self-pay

## 2018-08-08 VITALS — BP 133/91 | HR 73 | Temp 98.1°F | Wt 223.6 lb

## 2018-08-08 DIAGNOSIS — G4701 Insomnia due to medical condition: Secondary | ICD-10-CM | POA: Diagnosis not present

## 2018-08-08 DIAGNOSIS — F411 Generalized anxiety disorder: Secondary | ICD-10-CM

## 2018-08-08 MED ORDER — HYDROXYZINE PAMOATE 25 MG PO CAPS: 25.0000 mg | ORAL_CAPSULE | Freq: Three times a day (TID) | ORAL | 2 refills | Status: DC | PRN

## 2018-08-08 MED ORDER — TRAZODONE HCL 100 MG PO TABS: 50.0000 mg | ORAL_TABLET | Freq: Every evening | ORAL | 1 refills | Status: DC | PRN

## 2018-08-08 MED ORDER — DULOXETINE HCL 30 MG PO CPEP: 30.0000 mg | ORAL_CAPSULE | Freq: Two times a day (BID) | ORAL | 1 refills | Status: DC

## 2018-08-08 NOTE — Progress Notes (Signed)
[facility] MD OP Progress Note  08/08/2018 12:33 PM Maureen Randall  MRN:  [id number]  Chief Complaint: ' I am here for follow up." Chief Complaint    Follow-up     HPI: Maureen Randall is a 53 yr old female , presented to the clinic today follow-up visit.  Patient has a history of generalized anxiety disorder, insomnia, chronic pain, primary osteoarthritis, lumbar radiculopathy, degenerative spondylolisthesis, bilateral carpal tunnel syndrome, recent left hip surgery, history of right hip surgery.  Patient today reports that she recently had her left hip replacement surgery in November 2019.  She currently walks with a cane.  She reports she is recovering from the surgery well.  She continues to be in physical therapy at least twice a week.  She however reports she continues to have some pain especially pain in her lower back.  She does have a history of spinal fusion previously which could also be contributing to the pain.  She continues to work with her providers and is on pain management.  Patient reports the pain keeps her up at night.  The trazodone however has been very helpful.  She wants to stay on the trazodone 100 mg at this time.  Patient continues to have some anxiety symptoms on and off.  She however reports she has not been compliant with her Cymbalta as prescribed.  She was under the impression that Cymbalta was as needed and has been taking it only as needed.  She takes Cymbalta 30 mg when she gets anxious.  Discussed with patient that Cymbalta has to be taken every day.  Discussed with her to take 30 mg twice a day to help with her anxiety symptoms.  She will continue psychotherapy sessions without therapist Ms. Alden Hipp. Visit Diagnosis:    ICD-10-CM   1. GAD (generalized anxiety disorder) F41.1 hydrOXYzine (VISTARIL) 25 MG capsule    traZODone (DESYREL) 100 MG tablet    DULoxetine (CYMBALTA) 30 MG capsule    DISCONTINUED: DULoxetine (CYMBALTA) 30 MG capsule  2. Insomnia due to medical  condition G47.01 hydrOXYzine (VISTARIL) 25 MG capsule    traZODone (DESYREL) 100 MG tablet    Past Psychiatric History: Reviewed past psychiatric history from my progress note on 04/30/2018.  Past trials of Xanax, diazepam, clonazepam, Pamelor.  Past Medical History:  Past Medical History:  Diagnosis Date  . Allergy   . Anxiety   . Arthritis    osteoarthritis    Past Surgical History:  Procedure Laterality Date  . BREAST BIOPSY Left 2011   neg  . CARPAL TUNNEL RELEASE Right 12/2015   [facility]  . CARPAL TUNNEL RELEASE Left 12/2016  . FOOT SURGERY Right 2015  . HIP ARTHROSCOPY Right 05/2011  . REPLACEMENT TOTAL HIP W/  RESURFACING IMPLANTS Right 12/2017   [place] Right 12/2015   [facility]  . SPINAL FUSION  2016   [facility]    Family Psychiatric History: I have reviewed family psychiatric history from my progress note on 04/30/2018.  Family History:  Family History  Problem Relation Age of Onset  . Breast cancer Maternal Aunt   . Breast cancer Paternal Aunt     Social History: Reviewed social history from my progress note on 04/30/2018. Social History   Socioeconomic History  . Marital status: Married    Spouse name: fredrick  . Number of children: 1  . Years of education: Not on file  . Highest education level: High school graduate  Occupational  History    Comment: full time  Social Needs  . Financial resource strain: Somewhat hard  . Food insecurity:    Worry: Sometimes true    Inability: Sometimes true  . Transportation needs:    Medical: No    Non-medical: No  Tobacco Use  . Smoking status: Never Smoker  . Smokeless tobacco: Never Used  Substance and Sexual Activity  . Alcohol use: Not Currently  . Drug use: Never  . Sexual activity: Yes  Lifestyle  . Physical activity:    Days per week: 0 days    Minutes per session: 0 min  . Stress: Very much  Relationships  . Social connections:    Talks on phone: Not on  file    Gets together: Not on file    Attends religious service: Never    Active member of club or organization: No    Attends meetings of clubs or organizations: Never    Relationship status: Married  Other Topics Concern  . Not on file  Social History Narrative  . Not on file    Allergies:  Allergies  Allergen Reactions  . Cephalexin     rash  . Septra [Sulfamethoxazole-Trimethoprim]     hives    Metabolic Disorder Labs: No results found for: HGBA1C, MPG No results found for: PROLACTIN No results found for: CHOL, TRIG, HDL, CHOLHDL, VLDL, LDLCALC No results found for: TSH  Therapeutic Level Labs: No results found for: LITHIUM No results found for: VALPROATE No components found for:  CBMZ  Current Medications: Current Outpatient Medications  Medication Sig Dispense Refill  . aspirin EC 81 MG tablet 1 twice a day with food for 4 weeks.  Do not start until eliquis completed    . clindamycin (CLEOCIN) 300 MG capsule TK 2 CS PO ONE HOUR PRIOR TO PROCEDURE  3  . Cyanocobalamin (B-12 COMPLIANCE INJECTION IJ) Inject as directed every 3 (three) months.    . diclofenac sodium (VOLTAREN) 1 % GEL   11  . Elastic Bandages & Supports (RELIEF KNEE) MISC Apply in the morning and remove at night.    . estradiol (ESTRACE) 0.1 MG/GM vaginal cream INSERT PEA SIZED AMOUNT (0.5G) INTO VAGINA NIGHTLY EVERY OTHER NIGHT FOR 2 WEEKS, THEN TWICE WEEKLY    . fluticasone (FLONASE) 50 MCG/ACT nasal spray Place into both nostrils daily.    [name] gabapentin (NEURONTIN) 300 MG capsule TK 1 C PO BID P    . ibuprofen (ADVIL,MOTRIN) 800 MG tablet TK 1 T PO Q 8 H WF PRN P  2  . methocarbamol (ROBAXIN) 500 MG tablet Take 500 mg by mouth every 8 (eight) hours as needed for muscle spasms.    . Multiple Vitamin (MULTI-VITAMINS) TABS Take by mouth.    . Naproxen-Esomeprazole (VIMOVO PO) Take 500 mg by mouth 2 (two) times daily as needed.    . nitrofurantoin (MACRODANTIN) 50 MG capsule Take 50 mg by mouth 4 (four)  times daily as needed.    [name] oxyCODONE-acetaminophen (PERCOCET) 7.5-325 MG tablet Take 1 tablet by mouth every 8 (eight) hours as needed for severe pain.    . valACYclovir (VALTREX) 1000 MG tablet     . valACYclovir (VALTREX) 500 MG tablet Take 500 mg by mouth as needed.    [name] VIMOVO 500-20 MG TBEC Take 1 tablet by mouth 2 (two) times daily.  2  . DULoxetine (CYMBALTA) 30 MG capsule Take 1 capsule (30 mg total) by mouth 2 (two) times daily. 60 capsule 1  .  hydrOXYzine (VISTARIL) 25 MG capsule Take 1 capsule (25 mg total) by mouth 3 (three) times daily as needed for anxiety. Only for severe anxiety symptoms 90 capsule 2  . traZODone (DESYREL) 100 MG tablet Take 0.5-1 tablets (50-100 mg total) by mouth at bedtime as needed for sleep. 30 tablet 1   No current facility-administered medications for this visit.      Musculoskeletal: Strength & Muscle Tone: within normal limits Gait & Station: walks with cane Patient leans: N/A  Psychiatric Specialty Exam: Review of Systems  Musculoskeletal: Positive for back pain.  Psychiatric/Behavioral: The patient is nervous/anxious and has insomnia.   All other systems reviewed and are negative.   Blood pressure (!) 133/91, pulse 73, temperature 98.1 F (36.7 C), temperature source Oral, weight 223 lb 9.6 oz (101.4 kg).Body mass index is 35.02 kg/m.  General Appearance: Casual  Eye Contact:  Fair  Speech:  Clear and Coherent  Volume:  Normal  Mood:  Anxious  Affect:  Congruent  Thought Process:  Goal Directed and Descriptions of Associations: Intact  Orientation:  Full (Time, Place, and Person)  Thought Content: Logical   Suicidal Thoughts:  No  Homicidal Thoughts:  No  Memory:  Immediate;   Fair Recent;   Fair Remote;   Fair  Judgement:  Fair  Insight:  Fair  Psychomotor Activity:  Normal  Concentration:  Concentration: Fair and Attention Span: Fair  Recall:  [employer] of Knowledge: Fair  Language: Fair  Akathisia:  No  Handed:  Right   AIMS (if indicated): Denies tremors, stiffness  Assets:  Communication Skills Desire for Improvement  ADL's:  Intact  Cognition: WNL  Sleep:  restless   Screenings: PHQ2-9     Clinical Support from 02/11/2018 in [place] Office Visit from 01/29/2018 in [place]  PHQ-2 Total Score  0  0       Assessment and Plan: Mertha is a 53 year old Caucasian female, married, employed, currently on short-term disability from her job, lives in [place], has a history of chronic pain syndrome, history of multiple surgeries, right-sided hip replacement, recent left-sided hip replacement, primary osteoarthritis, degenerative spondylolisthesis, lumbar radiculopathy, bilateral carpal tunnel syndrome, presented to clinic today for a follow-up visit.  Patient continues to have psychosocial stressors of pain, financial stressors, physical limitations and so on.  Patient will benefit from continued medications.  Plan For generalized anxiety disorder-unstable Increase Cymbalta to 30 mg p.o. twice daily.  Discussed with patient that she needs to take the medication every day and that it is not as needed. Vistaril 25 mg p.o. 3 times daily as needed Continue CBT with Ms. Alden Hipp our therapist here in clinic.  For insomnia-restless Continue trazodone 100 mg p.o. nightly as needed Patient's sleep problems are also due to her pain.  She will continue to follow-up with her providers for pain management.  Follow-up in clinic in 2 months or sooner if needed.  I have spent atleast 15 minutes face to face with patient today. More than 50 % of the time was spent for psychoeducation and supportive psychotherapy and care coordination.  This note was generated in part or whole with voice recognition software. Voice recognition is usually quite accurate but there are transcription errors that can and very often do occur. I  apologize for any typographical errors that were not detected and corrected.         Ursula Alert, MD 08/08/2018, 12:33 PM

## 2018-08-21 ENCOUNTER — Ambulatory Visit: Payer: 59 | Admitting: Licensed Clinical Social Worker

## 2018-09-19 ENCOUNTER — Telehealth: Payer: Self-pay

## 2018-09-19 NOTE — Telephone Encounter (Signed)
Copied from [place] [id number]. Topic: Appointment Scheduling - New Patient >> Sep 19, 2018  8:49 AM Lennox Solders wrote: New patient has been scheduled for your office. Provider:[email or web address] Date of Appointment:10/29/2018 Route to department's PEC pool.

## 2018-09-23 ENCOUNTER — Other Ambulatory Visit: Payer: Self-pay

## 2018-09-23 ENCOUNTER — Ambulatory Visit
Admission: RE | Admit: 2018-09-23 | Discharge: 2018-09-23 | Disposition: A | Discharge status: Home / Self Care | Payer: 59 | Attending: Unknown Physician Specialty | Admitting: Unknown Physician Specialty

## 2018-09-23 ENCOUNTER — Ambulatory Visit
Admission: RE | Admit: 2018-09-23 | Discharge: 2018-09-23 | Disposition: A | Discharge status: Home / Self Care | Payer: 59 | Source: Ambulatory Visit | Attending: Unknown Physician Specialty | Admitting: Unknown Physician Specialty

## 2018-09-23 ENCOUNTER — Other Ambulatory Visit: Payer: Self-pay | Admitting: Unknown Physician Specialty

## 2018-09-23 DIAGNOSIS — J329 Chronic sinusitis, unspecified: Secondary | ICD-10-CM

## 2018-09-23 IMAGING — CR PARANASAL SINUSES - COMPLETE 3 + VIEW
1 series · 5 of 5 positions shown · non-contrast
Comparison: None.

CLINICAL DATA: 53-year-old female with congestion and pressure
between eyes for the past month. Initial encounter.

EXAM:
PARANASAL SINUSES - COMPLETE 3 + VIEW

[Series 1: dg sinuses complete · 0.14mm/px · 5 of 5 slices shown]
[im 1/5]
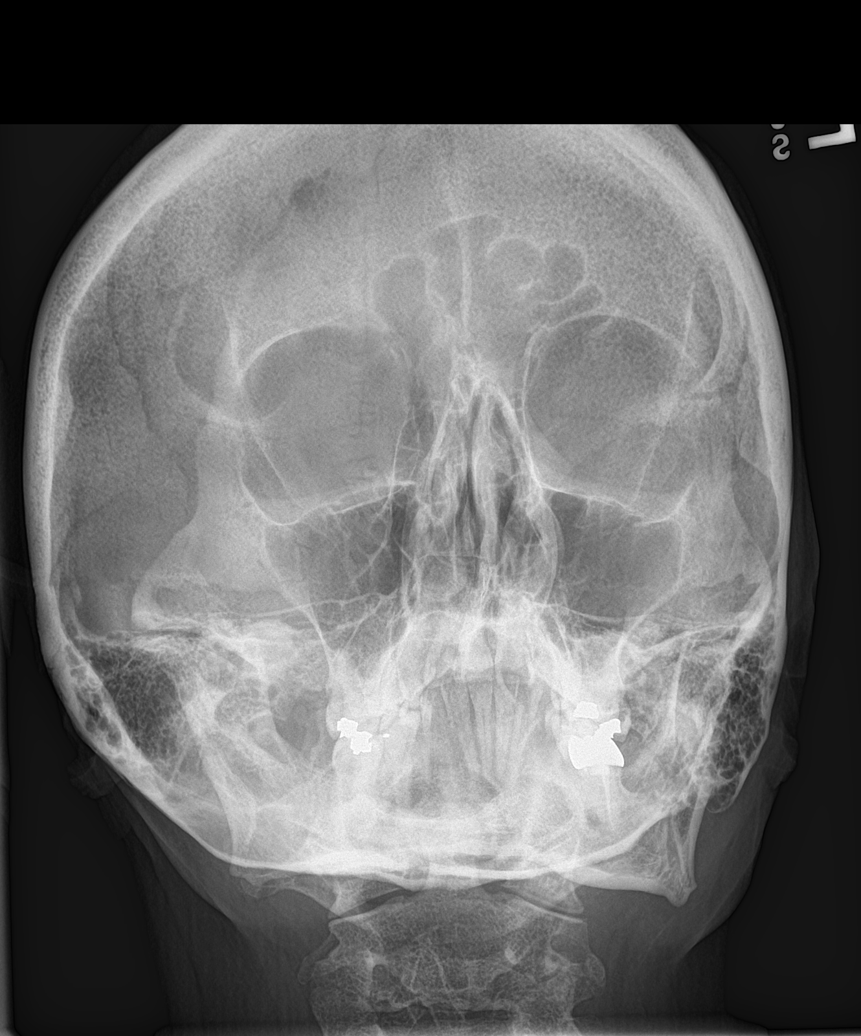
[im 2/5]
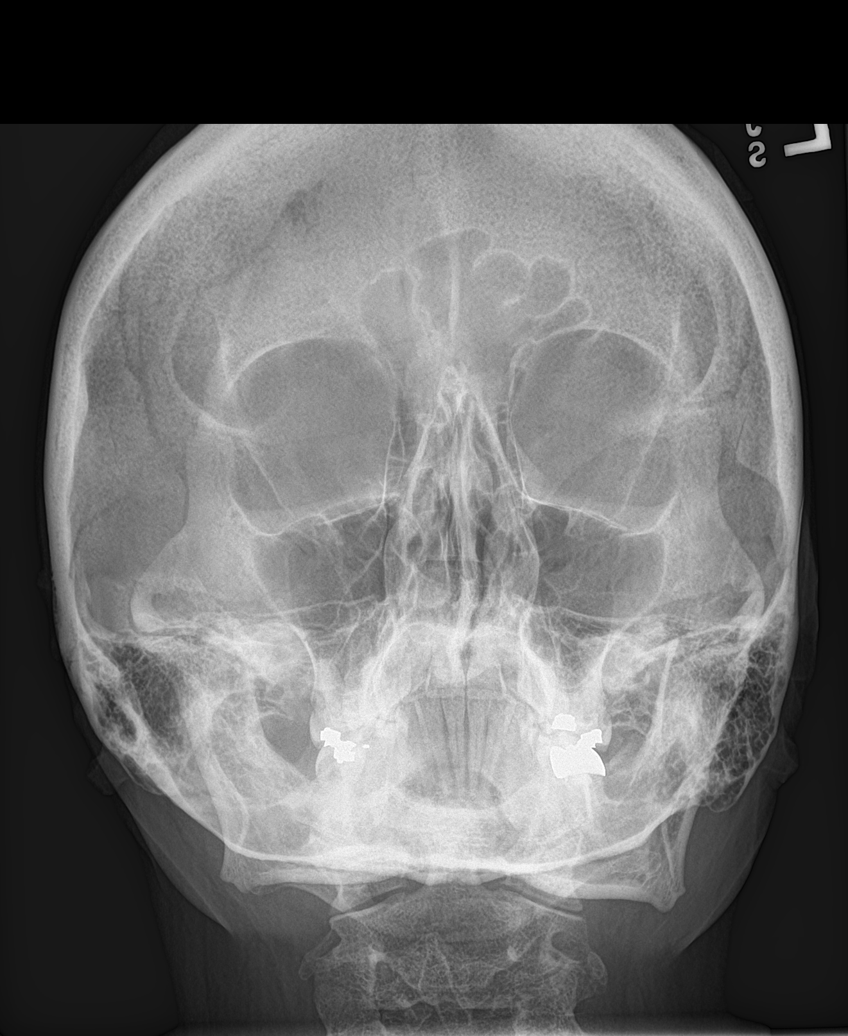
[im 3/5]
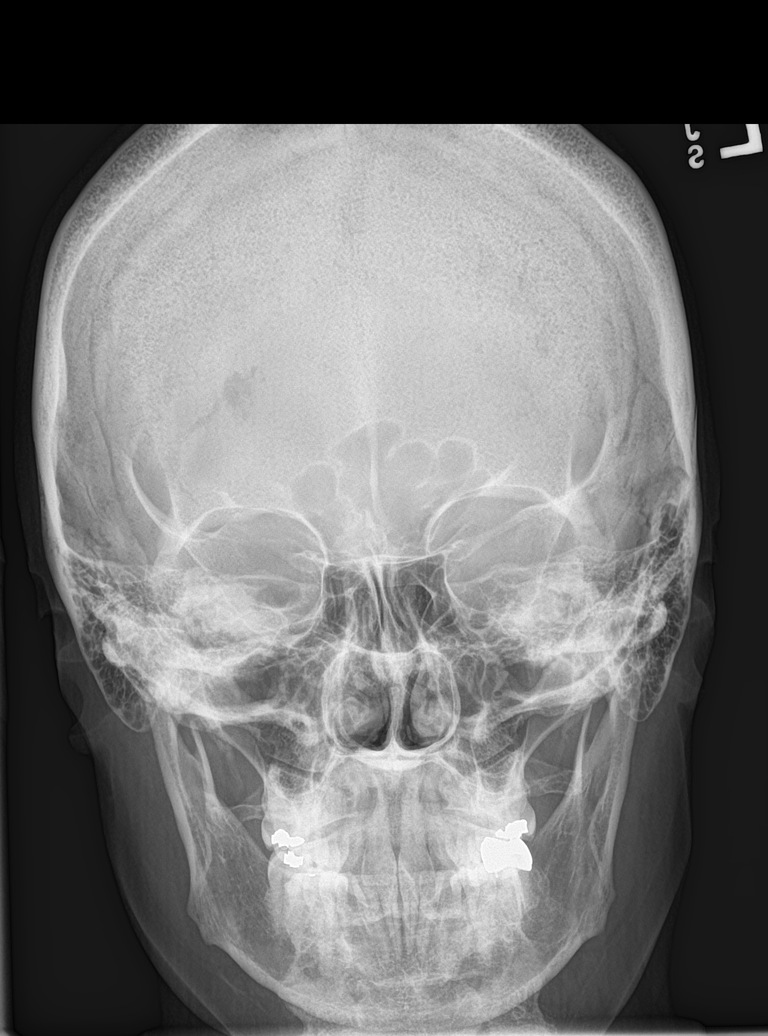
[im 4/5]
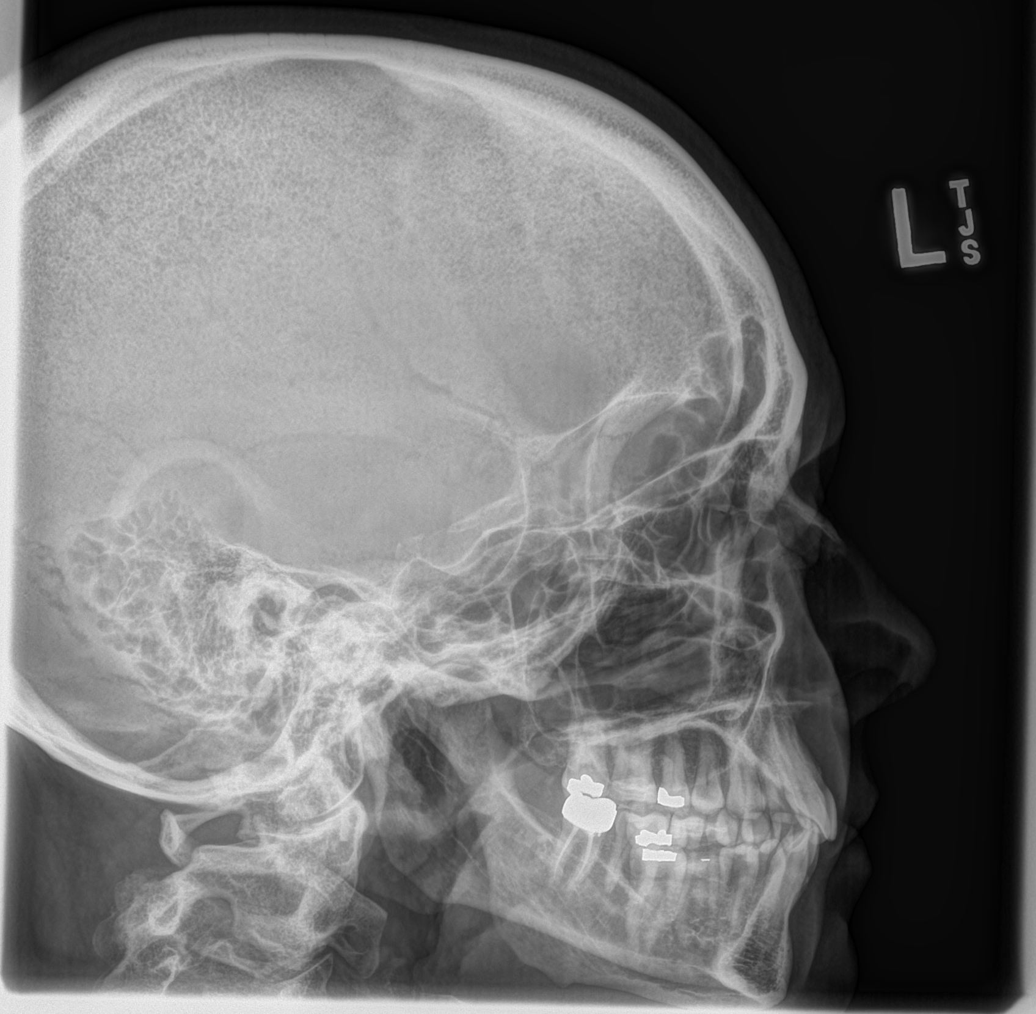
[im 5/5]
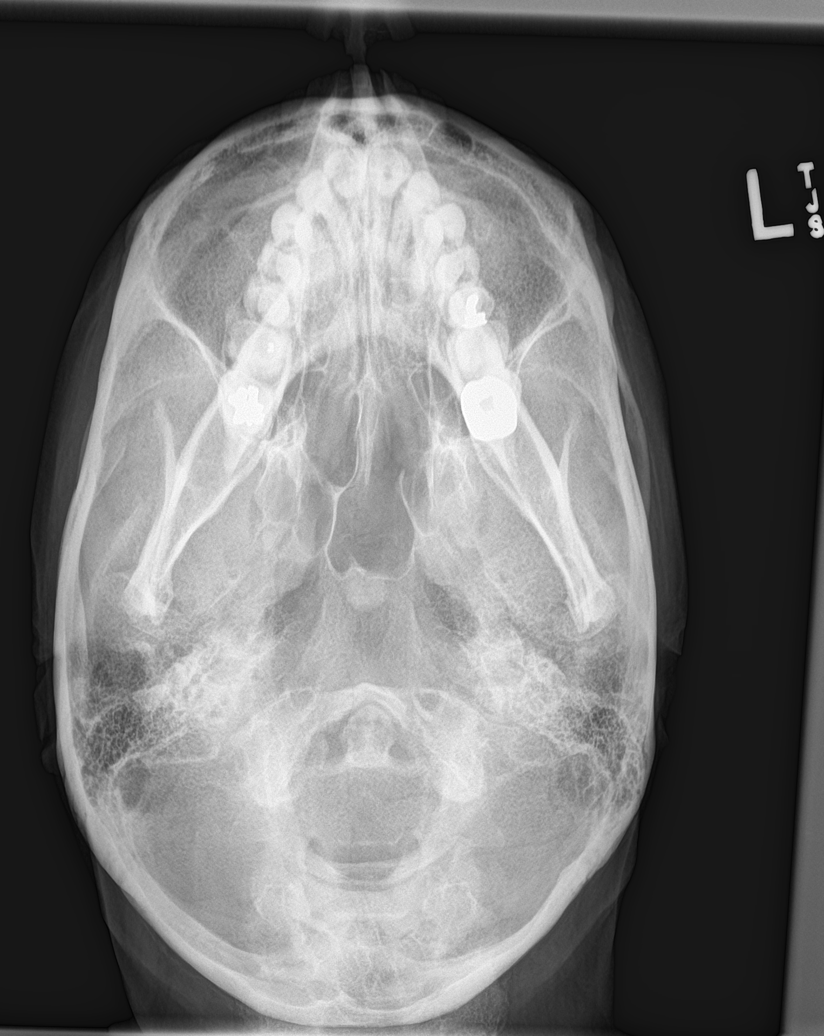

[5 of 5 positions shown; findings below may reference images not displayed]

FINDINGS: The paranasal sinus are aerated. There is no evidence of sinus
opacification air-fluid levels or mucosal thickening. No significant
bone abnormalities are seen.
IMPRESSION: Negative.

## 2018-09-23 NOTE — Telephone Encounter (Signed)
Done. Thank you.

## 2018-09-30 ENCOUNTER — Telehealth (INDEPENDENT_AMBULATORY_CARE_PROVIDER_SITE_OTHER): Payer: 59 | Admitting: Psychiatry

## 2018-09-30 ENCOUNTER — Telehealth: Payer: Self-pay

## 2018-09-30 ENCOUNTER — Encounter: Payer: Self-pay | Admitting: Psychiatry

## 2018-09-30 ENCOUNTER — Other Ambulatory Visit: Payer: Self-pay

## 2018-09-30 DIAGNOSIS — G4701 Insomnia due to medical condition: Secondary | ICD-10-CM

## 2018-09-30 DIAGNOSIS — F411 Generalized anxiety disorder: Secondary | ICD-10-CM

## 2018-09-30 MED ORDER — TRAZODONE HCL 100 MG PO TABS: 50.0000 mg | ORAL_TABLET | Freq: Every evening | ORAL | 1 refills | Status: DC | PRN

## 2018-09-30 MED ORDER — DULOXETINE HCL 30 MG PO CPEP: 30.0000 mg | ORAL_CAPSULE | Freq: Two times a day (BID) | ORAL | 1 refills | Status: DC

## 2018-09-30 NOTE — Telephone Encounter (Signed)
pt called states that her insurance will not pay for her rx [facility] so [facility] [address]

## 2018-09-30 NOTE — Telephone Encounter (Signed)
[facility].

## 2018-09-30 NOTE — Progress Notes (Signed)
[facility] MD[facility] Visit via Telephone Note  I connected with Maureen Randall on 09/30/18 at  9:30 AM EDT by telephone and verified that I am speaking with the correct person using two identifiers.   I discussed the limitations, risks, security and privacy concerns of performing an evaluation and management service by telephone and the availability of in person appointments. I also discussed with the patient that there may be a patient responsible charge related to this service. The patient expressed understanding and agreed to proceed.   I discussed the assessment and treatment plan with the patient. The patient was provided an opportunity to ask questions and all were answered. The patient agreed with the plan and demonstrated an understanding of the instructions.   The patient was advised to call back or seek an in-person evaluation if the symptoms worsen or if the condition fails to improve as anticipated.  I provided 15 minutes of non-face-to-face time during this encounter.   Ursula Alert, MD   OP Progress Note  09/30/2018 11:02 AM Maureen Randall  MRN:  [id number]  Chief Complaint: ' Follow-up' Chief Complaint    Follow-up; Anxiety     HPI: Nicol is a 53 year old female, has a history of generalized anxiety disorder, insomnia, chronic pain, primary osteoarthritis, lumbar radiculopathy, degenerative spondylo-listhesis, bilateral carpal tunnel syndrome, recent hip surgery, was evaluated today by phone.  Patient today reports she is currently making progress with regards to her mood symptoms.  She is compliant with her Cymbalta now.  She takes it scheduled every day.  She denies any side effects to the medication.  She reports her anxiety symptoms is improving.  She reports she is compliant with her trazodone for sleep.  Her sleep hence has improved.  She continues to have some pain however it is more controlled.   Patient denies any suicidality, homicidality or perceptual  disturbances.  Patient continues to be motivated to stay in therapy.  She has upcoming appointment with Ms. Alden Hipp.   Visit Diagnosis:    ICD-10-CM   1. GAD (generalized anxiety disorder) F41.1 DULoxetine (CYMBALTA) 30 MG capsule    traZODone (DESYREL) 100 MG tablet  2. Insomnia due to medical condition G47.01 traZODone (DESYREL) 100 MG tablet    Past Psychiatric History: I have reviewed past psychiatric history from my progress note on 04/30/2018.  Past trials of Xanax, diazepam, clonazepam, Pamelor.  Past Medical History:  Past Medical History:  Diagnosis Date  . Allergy   . Anxiety   . Arthritis    osteoarthritis    Past Surgical History:  Procedure Laterality Date  . BREAST BIOPSY Left 2011   neg  . CARPAL TUNNEL RELEASE Right 12/2015   [facility]  . CARPAL TUNNEL RELEASE Left 12/2016  . FOOT SURGERY Right 2015  . HIP ARTHROSCOPY Right 05/2011  . REPLACEMENT TOTAL HIP W/  RESURFACING IMPLANTS Right 12/2017   [place] Right 12/2015   [facility]  . SPINAL FUSION  2016   [facility]    Family Psychiatric History: I have reviewed family psychiatric history from my progress note on 04/30/2018. Family History:  Family History  Problem Relation Age of Onset  . Breast cancer Maternal Aunt   . Breast cancer Paternal Aunt     Social History: I have reviewed social history from my progress note on 04/30/2018. Social History   Socioeconomic History  . Marital status: Married    Spouse name: fredrick  . Number of children:  1  . Years of education: Not on file  . Highest education level: High school graduate  Occupational History    Comment: full time  Social Needs  . Financial resource strain: Somewhat hard  . Food insecurity:    Worry: Sometimes true    Inability: Sometimes true  . Transportation needs:    Medical: No    Non-medical: No  Tobacco Use  . Smoking status: Never Smoker  . Smokeless tobacco: Never Used   Substance and Sexual Activity  . Alcohol use: Not Currently  . Drug use: Never  . Sexual activity: Yes  Lifestyle  . Physical activity:    Days per week: 0 days    Minutes per session: 0 min  . Stress: Very much  Relationships  . Social connections:    Talks on phone: Not on file    Gets together: Not on file    Attends religious service: Never    Active member of club or organization: No    Attends meetings of clubs or organizations: Never    Relationship status: Married  Other Topics Concern  . Not on file  Social History Narrative  . Not on file    Allergies:  Allergies  Allergen Reactions  . Amoxicillin Hives  . Cephalexin     rash  . Septra [Sulfamethoxazole-Trimethoprim]     hives    Metabolic Disorder Labs: No results found for: HGBA1C, MPG No results found for: PROLACTIN No results found for: CHOL, TRIG, HDL, CHOLHDL, VLDL, LDLCALC No results found for: TSH  Therapeutic Level Labs: No results found for: LITHIUM No results found for: VALPROATE No components found for:  CBMZ  Current Medications: Current Outpatient Medications  Medication Sig Dispense Refill  . albuterol (PROVENTIL HFA;VENTOLIN HFA) 108 (90 Base) MCG/ACT inhaler INL 2 PFS PO Q 4 TO 6 H PRF WHZ    . aspirin EC 81 MG tablet 1 twice a day with food for 4 weeks.  Do not start until eliquis completed    . clindamycin (CLEOCIN) 300 MG capsule TK 2 CS PO ONE HOUR PRIOR TO PROCEDURE  3  . Cyanocobalamin (B-12 COMPLIANCE INJECTION IJ) Inject as directed every 3 (three) months.    . diclofenac sodium (VOLTAREN) 1 % GEL   11  . DULoxetine (CYMBALTA) 30 MG capsule Take 1 capsule (30 mg total) by mouth 2 (two) times daily. 60 capsule 1  . Elastic Bandages & Supports (RELIEF KNEE) MISC Apply in the morning and remove at night.    . estradiol (ESTRACE) 0.1 MG/GM vaginal cream INSERT PEA SIZED AMOUNT (0.5G) INTO VAGINA NIGHTLY EVERY OTHER NIGHT FOR 2 WEEKS, THEN TWICE WEEKLY    . fluticasone (FLONASE)  50 MCG/ACT nasal spray Place into both nostrils daily.    [name] gabapentin (NEURONTIN) 300 MG capsule TK 1 C PO BID P    . hydrOXYzine (VISTARIL) 25 MG capsule Take 1 capsule (25 mg total) by mouth 3 (three) times daily as needed for anxiety. Only for severe anxiety symptoms 90 capsule 2  . ibuprofen (ADVIL,MOTRIN) 800 MG tablet TK 1 T PO Q 8 H WF PRN P  2  . methocarbamol (ROBAXIN) 500 MG tablet Take 500 mg by mouth every 8 (eight) hours as needed for muscle spasms.    . Multiple Vitamin (MULTI-VITAMINS) TABS Take by mouth.    . Naproxen-Esomeprazole (VIMOVO PO) Take 500 mg by mouth 2 (two) times daily as needed.    . nitrofurantoin (MACRODANTIN) 50 MG capsule Take  50 mg by mouth 4 (four) times daily as needed.    [name] oxyCODONE-acetaminophen (PERCOCET) 7.5-325 MG tablet Take 1 tablet by mouth every 8 (eight) hours as needed for severe pain.    . traZODone (DESYREL) 100 MG tablet Take 0.5-1 tablets (50-100 mg total) by mouth at bedtime as needed for sleep. 30 tablet 1  . valACYclovir (VALTREX) 1000 MG tablet     . VIMOVO 500-20 MG TBEC Take 1 tablet by mouth 2 (two) times daily.  2   No current facility-administered medications for this visit.      Musculoskeletal: Strength & Muscle Tone: unable to assess Gait & Station: unable to assess Patient leans: unable to assess  Psychiatric Specialty Exam: Review of Systems  Psychiatric/Behavioral: Negative for depression. The patient is nervous/anxious (improving).   All other systems reviewed and are negative.   There were no vitals taken for this visit.There is no height or weight on file to calculate BMI.  General Appearance: unable to assess  Eye Contact:  unable to assess  Speech:  Clear and Coherent  Volume:  Normal  Mood:  Anxious  Affect:  unable to assess  Thought Process:  Goal Directed and Descriptions of Associations: Intact  Orientation:  Full (Time, Place, and Person)  Thought Content: Logical   Suicidal Thoughts:  No  Homicidal  Thoughts:  No  Memory:  Immediate;   Fair Recent;   Fair Remote;   Fair  Judgement:  Fair  Insight:  Fair  Psychomotor Activity:  denies tremors, restlessness  Concentration:  Concentration: Fair and Attention Span: Fair  Recall:  [employer] of Knowledge: Fair  Language: Fair  Akathisia:  No  Handed:  Right  AIMS (if indicated):denies rigidity,stiffness  Assets:  Communication Skills Desire for Improvement Social Support  ADL's:  Intact  Cognition: WNL  Sleep:  improved   Screenings: PHQ2-9     Clinical Support from 02/11/2018 in [place] Office Visit from 01/29/2018 in [place]  PHQ-2 Total Score  0  0       Assessment and Plan: Dajae is a 53 yr old Caucasian female, married, employed, currently on short-term disability from her job, lives in [place], has a history of chronic pain syndrome, multiple surgeries, osteoarthritis, degenerative spondylolisthesis, lumbar radiculopathy, bilateral carpal tunnel syndrome, evaluated by phone today.  Patient continues to have psychosocial stressors of pain, financial stressors, however is making progress on the current medication regimen.  Plan as noted below.   Plan For GAD-improving Cymbalta 30 mg p.o. twice daily. Hydroxyzine 25 mg p.o. 3 times daily as needed Continue CBT with Ms. Cecilie Lowers.  For insomnia-improving Trazodone 100 mg p.o. nightly as needed Her sleep problems are also due to pain which is more under control now.  Follow-up in clinic in 2 to 3 months or sooner if needed.  I have spent atleast 15 minutes non face to face with patient today. More than 50 % of the time was spent for psychoeducation and supportive psychotherapy and care coordination.  This note was generated in part or whole with voice recognition software. Voice recognition is usually quite accurate but there are transcription errors that can and very often do  occur. I apologize for any typographical errors that were not detected and corrected.         Ursula Alert, MD 09/30/2018, 11:02 AM

## 2018-10-02 MED ORDER — DULOXETINE HCL 30 MG PO CPEP: 30.0000 mg | ORAL_CAPSULE | Freq: Two times a day (BID) | ORAL | 1 refills | Status: DC

## 2018-10-02 MED ORDER — TRAZODONE HCL 100 MG PO TABS: 50.0000 mg | ORAL_TABLET | Freq: Every evening | ORAL | 1 refills | Status: DC | PRN

## 2018-10-02 MED ORDER — HYDROXYZINE PAMOATE 25 MG PO CAPS: 25.0000 mg | ORAL_CAPSULE | Freq: Three times a day (TID) | ORAL | 0 refills | Status: DC | PRN

## 2018-10-02 NOTE — Telephone Encounter (Signed)
pt called states that she needs [facility]

## 2018-10-02 NOTE — Telephone Encounter (Signed)
Sent meds 90 days supply to [facility]

## 2018-10-20 DIAGNOSIS — I872 Venous insufficiency (chronic) (peripheral): Secondary | ICD-10-CM | POA: Insufficient documentation

## 2018-10-20 HISTORY — DX: Venous insufficiency (chronic) (peripheral): I87.2

## 2018-10-23 DIAGNOSIS — I82561 Chronic embolism and thrombosis of right calf muscular vein: Secondary | ICD-10-CM | POA: Insufficient documentation

## 2018-10-27 ENCOUNTER — Ambulatory Visit (INDEPENDENT_AMBULATORY_CARE_PROVIDER_SITE_OTHER): Payer: 59 | Admitting: Licensed Clinical Social Worker

## 2018-10-27 ENCOUNTER — Other Ambulatory Visit: Payer: Self-pay

## 2018-10-27 ENCOUNTER — Encounter: Payer: Self-pay | Admitting: Licensed Clinical Social Worker

## 2018-10-27 DIAGNOSIS — F411 Generalized anxiety disorder: Secondary | ICD-10-CM

## 2018-10-27 NOTE — Progress Notes (Signed)
  Virtual Visit via Telephone Note  I connected with Maureen Randall on 10/27/18 at  9:00 AM EDT by telephone and verified that I am speaking with the correct person using two identifiers.   I discussed the limitations, risks, security and privacy concerns of performing an evaluation and management service by telephone and the availability of in person appointments. I also discussed with the patient that there may be a patient responsible charge related to this service. The patient expressed understanding and agreed to proceed.  I discussed the assessment and treatment plan with the patient. The patient was provided an opportunity to ask questions and all were answered. The patient agreed with the plan and demonstrated an understanding of the instructions.   The patient was advised to call back or seek an in-person evaluation if the symptoms worsen or if the condition fails to improve as anticipated.  I provided 35 minutes of non-face-to-face time during this encounter.   Alden Hipp, LCSW   THERAPIST PROGRESS NOTE  Session Time: 0900  Participation Level: Active  Behavioral Response: NAAlertAnxious  Type of Therapy: Individual Therapy  Treatment Goals addressed: Coping  Interventions: CBT  Summary: Maureen Randall is a 53 y.o. female who presents with continued symptoms of her diagnosis. Maureen Randall reports doing well since our last session in November. She reports having another hip replacement since that appointment, due to increased back pain. She reports she has had continued pain since her surgery in her back, though a MRI revealed her back is operating normally. She reports feeling very stressed out about the unknown regarding her pain, and states, "some days it's so bad I can't walk to the mailbox. I'm walking with a cane again." LCSW encouraged Maureen Randall to allow herself time to feel depressed, but then attempt to challenge negative thoughts with more positively framed ones. For instance,  "This is awful and I'm going to hurt forever could be changed to, this is awful right now--but things will get better." Maureen Randall expressed understanding and agreement with this idea. She reports she has begun journaling, which she thinks has helped with her anxiety as well. LCSW validated that coping mechanism and encouraged Maureen Randall to also begin a gratitude list at the end of each journal entry to attempt to begin reframing negative thoughts. Maureen Randall expressed understanding and agreement with this idea as well. She reported feeling anxiety around not knowing if she will be able to return to work due to ongoing health issues, and expressed feeling anxious about the thought of not knowing what she will do. LCSW normalized those feelings and also encouraged Maureen Randall to recognize those events have not happened yet, and to remind herself of that when she feels herself beginning to spiral into her anxiety. Maureen Randall expressed understanding and agreement with this idea as well.   Suicidal/Homicidal: No  Therapist Response: Maureen Randall continues to work towards her tx goals but has not yet reached them.  We will continue to utilize CBT moving forward to assist Maureen Randall in managing her anxiety symptoms and her ability to regulate her emotions in the moment in order to stay safe and calm.   Plan: Return again in 4 weeks.  Diagnosis: Axis I: Generalized Anxiety Disorder    Axis II: No diagnosis    Alden Hipp, LCSW 10/27/2018

## 2018-10-29 ENCOUNTER — Ambulatory Visit: Payer: Self-pay | Admitting: Internal Medicine

## 2018-11-11 ENCOUNTER — Encounter: Payer: Self-pay | Admitting: Psychiatry

## 2018-11-11 ENCOUNTER — Ambulatory Visit (INDEPENDENT_AMBULATORY_CARE_PROVIDER_SITE_OTHER): Payer: 59 | Admitting: Psychiatry

## 2018-11-11 ENCOUNTER — Other Ambulatory Visit: Payer: Self-pay

## 2018-11-11 DIAGNOSIS — G4701 Insomnia due to medical condition: Secondary | ICD-10-CM

## 2018-11-11 DIAGNOSIS — F411 Generalized anxiety disorder: Secondary | ICD-10-CM

## 2018-11-11 MED ORDER — DULOXETINE HCL 30 MG PO CPEP: 90.0000 mg | ORAL_CAPSULE | Freq: Every day | ORAL | 1 refills | Status: DC

## 2018-11-11 NOTE — Progress Notes (Signed)
Virtual Visit via Video Note  I connected with Maureen Randall on 11/11/18 at 10:30 AM EDT by a video enabled telemedicine application and verified that I am speaking with the correct person using two identifiers.   I discussed the limitations of evaluation and management by telemedicine and the availability of in person appointments. The patient expressed understanding and agreed to proceed.    I discussed the assessment and treatment plan with the patient. The patient was provided an opportunity to ask questions and all were answered. The patient agreed with the plan and demonstrated an understanding of the instructions.   The patient was advised to call back or seek an in-person evaluation if the symptoms worsen or if the condition fails to improve as anticipated.   [place] MD OP Progress Note  11/11/2018 11:37 AM Maureen Randall  MRN:  [id number]  Chief Complaint:  Chief Complaint    Follow-up     HPI: Maureen Randall is a 53 year old Caucasian female, has a history of generalized anxiety disorder, insomnia, chronic pain, primary osteoarthritis, lumbar radiculopathy, degenerative spondylolisthesis, bilateral carpal tunnel syndrome, recent hip surgery, was evaluated by telemedicine today.  Patient today reports that she has been struggling with significant pain.  She reports she is unable to walk except for short distances around the house.  She has been using a cane to walk around.  She struggles with hip joint pain as well as back pain on a regular basis.  She reports she had an MRI done with her provider recently.  She also got 2 injections to her back 2 weeks ago.  She has to go back for an appointment tomorrow.  She has no relief even with the injection.  She reports the pain is making her more and more anxious.  She reports she has been sleeping so far with the trazodone.  She denies any side effects to the medications.  She reports her husband works as a Administrator and he is doing okay at work  so far.  She however continues to struggle with financial worry since she is currently on long-term disability with her work.  She is currently fighting with her company to accept her ADA application.  That way she will have some jaw protection.  However with the COVID-19 she is not so sure if she can return to work or not.  She denies any suicidality, homicidality or perceptual disturbances.  She has been following up with Ms. Alden Hipp for therapy and has an upcoming appointment coming up.   Visit Diagnosis:    ICD-10-CM   1. GAD (generalized anxiety disorder) F41.1 DULoxetine (CYMBALTA) 30 MG capsule  2. Insomnia due to medical condition G47.01     Past Psychiatric History: Reviewed past psychiatric history from my progress note on 04/30/2018.  Past trials of Xanax, diazepam, clonazepam, Pamelor.  Past Medical History:  Past Medical History:  Diagnosis Date  . Allergy   . Anxiety   . Arthritis    osteoarthritis    Past Surgical History:  Procedure Laterality Date  . BREAST BIOPSY Left 2011   neg  . CARPAL TUNNEL RELEASE Right 12/2015   [facility]  . CARPAL TUNNEL RELEASE Left 12/2016  . FOOT SURGERY Right 2015  . HIP ARTHROSCOPY Right 05/2011  . REPLACEMENT TOTAL HIP W/  RESURFACING IMPLANTS Right 12/2017   [place] [facility] Right 12/2015   [facility]  . SPINAL FUSION  2016   [facility]  Family Psychiatric History: Family psychiatric history from my progress note on 04/30/2018.  Family History:  Family History  Problem Relation Age of Onset  . Breast cancer Maternal Aunt   . Breast cancer Paternal Aunt     Social History: Reviewed social history from my progress note on 04/30/2018. Social History   Socioeconomic History  . Marital status: Married    Spouse name: Randall  . Number of children: 1  . Years of education: Not on file  . Highest education level: High school graduate  Occupational History    Comment: full time   Social Needs  . Financial resource strain: Somewhat hard  . Food insecurity:    Worry: Sometimes true    Inability: Sometimes true  . Transportation needs:    Medical: No    Non-medical: No  Tobacco Use  . Smoking status: Never Smoker  . Smokeless tobacco: Never Used  Substance and Sexual Activity  . Alcohol use: Not Currently  . Drug use: Never  . Sexual activity: Yes  Lifestyle  . Physical activity:    Days per week: 0 days    Minutes per session: 0 min  . Stress: Very much  Relationships  . Social connections:    Talks on phone: Not on file    Gets together: Not on file    Attends religious service: Never    Active member of club or organization: No    Attends meetings of clubs or organizations: Never    Relationship status: Married  Other Topics Concern  . Not on file  Social History Narrative  . Not on file    Allergies:  Allergies  Allergen Reactions  . Amoxicillin Hives  . Cephalexin     rash  . Septra [Sulfamethoxazole-Trimethoprim]     hives    Metabolic Disorder Labs: No results found for: HGBA1C, MPG No results found for: PROLACTIN No results found for: CHOL, TRIG, HDL, CHOLHDL, VLDL, LDLCALC No results found for: TSH  Therapeutic Level Labs: No results found for: LITHIUM No results found for: VALPROATE No components found for:  CBMZ  Current Medications: Current Outpatient Medications  Medication Sig Dispense Refill  . albuterol (PROVENTIL HFA;VENTOLIN HFA) 108 (90 Base) MCG/ACT inhaler INL 2 PFS PO Q 4 TO 6 H PRF WHZ    . aspirin EC 81 MG tablet 1 twice a day with food for 4 weeks.  Do not start until eliquis completed    . azelastine (ASTELIN) 0.1 % nasal spray INSTILL 1 SPRAY IEN D FOR ALLERGIES    . calcium carbonate (OS-CAL - DOSED IN MG OF ELEMENTAL CALCIUM) 1250 (500 Ca) MG tablet Take by mouth.    . Cholecalciferol (VITAMIN D3) 25 MCG (1000 UT) CAPS Take by mouth.    . clindamycin (CLEOCIN) 300 MG capsule TK 2 CS PO ONE HOUR PRIOR  TO PROCEDURE  3  . clotrimazole (MYCELEX) 10 MG troche TAKE 1 TROCHE ORALLY 5 TIMES A DAY X 2 WEEKS    . Cyanocobalamin (B-12 COMPLIANCE INJECTION IJ) Inject as directed every 3 (three) months.    . diclofenac sodium (VOLTAREN) 1 % GEL   11  . DULoxetine (CYMBALTA) 30 MG capsule Take 3 capsules (90 mg total) by mouth daily. 270 capsule 1  . Elastic Bandages & Supports (RELIEF KNEE) MISC Apply in the morning and remove at night.    . estradiol (ESTRACE) 0.1 MG/GM vaginal cream INSERT PEA SIZED AMOUNT (0.5G) INTO VAGINA NIGHTLY EVERY OTHER NIGHT FOR 2 WEEKS,  THEN TWICE WEEKLY    . fluticasone (FLONASE) 50 MCG/ACT nasal spray Place into both nostrils daily.    [name] gabapentin (NEURONTIN) 300 MG capsule TK 1 C PO BID P    . hydrOXYzine (VISTARIL) 25 MG capsule Take 1 capsule (25 mg total) by mouth 3 (three) times daily as needed for anxiety. Only for severe anxiety symptoms 270 capsule 0  . ibuprofen (ADVIL,MOTRIN) 800 MG tablet TK 1 T PO Q 8 H WF PRN P  2  . methocarbamol (ROBAXIN) 500 MG tablet Take 500 mg by mouth every 8 (eight) hours as needed for muscle spasms.    . Multiple Vitamin (MULTI-VITAMINS) TABS Take by mouth.    . Naproxen-Esomeprazole (VIMOVO PO) Take 500 mg by mouth 2 (two) times daily as needed.    . nitrofurantoin (MACRODANTIN) 50 MG capsule Take 50 mg by mouth 4 (four) times daily as needed.    . nystatin (MYCOSTATIN) 100000 UNIT/ML suspension SAS 5 ML PO FOUR TIMES DAILY. CONT 2 DAYS AFTER SYMPTOMS GONE    . oxyCODONE (OXYCONTIN) 10 mg 12 hr tablet TK 1 T PO Q 12 H PRN    . oxyCODONE-acetaminophen (PERCOCET) 7.5-325 MG tablet Take 1 tablet by mouth every 8 (eight) hours as needed for severe pain.    . traZODone (DESYREL) 100 MG tablet Take 0.5-1 tablets (50-100 mg total) by mouth at bedtime as needed for sleep. 90 tablet 1  . valACYclovir (VALTREX) 1000 MG tablet     . VIMOVO 500-20 MG TBEC Take 1 tablet by mouth 2 (two) times daily.  2   No current facility-administered  medications for this visit.      Musculoskeletal: Strength & Muscle Tone: UTA Gait & Station: Walks with cane Patient leans: N/A  Psychiatric Specialty Exam: Review of Systems  Musculoskeletal: Positive for back pain and joint pain.  Psychiatric/Behavioral: The patient is nervous/anxious.   All other systems reviewed and are negative.   There were no vitals taken for this visit.There is no height or weight on file to calculate BMI.  General Appearance: Casual  Eye Contact:  Fair  Speech:  Clear and Coherent  Volume:  Normal  Mood:  Anxious  Affect:  Congruent  Thought Process:  Goal Directed and Descriptions of Associations: Intact  Orientation:  Full (Time, Place, and Person)  Thought Content: Logical   Suicidal Thoughts:  No  Homicidal Thoughts:  No  Memory:  Immediate;   Fair Recent;   Fair Remote;   Fair  Judgement:  Fair  Insight:  Fair  Psychomotor Activity:  Normal  Concentration:  Concentration: Fair and Attention Span: Fair  Recall:  [employer] of Knowledge: Fair  Language: Fair  Akathisia:  No  Handed:  Right  AIMS (if indicated):denies tremors, rigidity  Assets:  Communication Skills Desire for Improvement Social Support  ADL's:  Intact  Cognition: WNL  Sleep:  Fair   Screenings: PHQ2-9     Clinical Support from 02/11/2018 in [place] Office Visit from 01/29/2018 in [place]  PHQ-2 Total Score  0  0       Assessment and Plan: Fatuma is a 53 year old Caucasian female, married, employed, currently on long-term disability from her job, lives in [place], has a history of chronic pain syndrome, multiple surgeries, osteoarthritis, degenerative spondylolisthesis, lumbar radiculopathy, bilateral carpal tunnel syndrome was evaluated by telemedicine today.  Patient continues to have several psychosocial stressors of her own health  issues, COVID-19 outbreak, financial  concerns .  Patient will continue to benefit from medication readjustment.  Plan as noted below.  Plan For GAD-unstable Increase Cymbalta to 90 mg p.o. daily. Hydroxyzine 25 mg p.o. 3 times daily PRN, she rarely uses it. Continue CBT with Ms. Cecilie Lowers.  For insomnia-improving Trazodone 100 mg p.o. nightly as needed  Follow-up in clinic in 1 month or sooner if needed.  I have spent atleast 25 minutes non face to face with patient today. More than 50 % of the time was spent for psychoeducation and supportive psychotherapy and care coordination.  This note was generated in part or whole with voice recognition software. Voice recognition is usually quite accurate but there are transcription errors that can and very often do occur. I apologize for any typographical errors that were not detected and corrected.         Ursula Alert, MD 11/11/2018, 11:37 AM

## 2018-11-24 ENCOUNTER — Encounter: Payer: Self-pay | Admitting: Licensed Clinical Social Worker

## 2018-11-24 ENCOUNTER — Other Ambulatory Visit: Payer: Self-pay

## 2018-11-24 ENCOUNTER — Ambulatory Visit (INDEPENDENT_AMBULATORY_CARE_PROVIDER_SITE_OTHER): Payer: 59 | Admitting: Licensed Clinical Social Worker

## 2018-11-24 DIAGNOSIS — F411 Generalized anxiety disorder: Secondary | ICD-10-CM

## 2018-11-24 NOTE — Progress Notes (Signed)
Virtual Visit via Video Note  I connected with Maureen Randall on 11/24/18 at  9:00 AM EDT by a video enabled telemedicine application and verified that I am speaking with the correct person using two identifiers.   I discussed the limitations of evaluation and management by telemedicine and the availability of in person appointments. The patient expressed understanding and agreed to proceed.  I discussed the assessment and treatment plan with the patient. The patient was provided an opportunity to ask questions and all were answered. The patient agreed with the plan and demonstrated an understanding of the instructions.   The patient was advised to call back or seek an in-person evaluation if the symptoms worsen or if the condition fails to improve as anticipated.  I provided 45 minutes of non-face-to-face time during this encounter.   Alden Hipp, LCSW    THERAPIST PROGRESS NOTE  Session Time: 0900  Participation Level: Active  Behavioral Response: NeatAlertAnxious  Type of Therapy: Individual Therapy  Treatment Goals addressed: Coping  Interventions: CBT  Summary: Maureen Randall is a 53 y.o. female who presents with continued symptoms of her diagnosis. Maureen Randall reports doing well since our last session, but reports continued symptoms of anxiety around returning to work. She reports she brainstormed what she would like to do if she was not able to return to work as discussed in our last session. 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 LCSW asked her about disability as a Plan B. Maureen Randall found comfort in that idea and reported it would be nice to have that as an option. 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 Maureen Randall expressed understanding and agreement  with this idea. Maureen Randall also reported having difficulty not feeling guilty for not being active. She reported she attempted to start setting small goals as we dsicussed at our last session, but stated she ends up feeling more pain after the activity so feels discouraged. LCSW encouraged Maureen Randall to adjust her goals to less active activities in order to decease the chances of her feeling pain after the event. Maureen Randall expressed agreement with this idea. Maureen Randall expressed anxiety around all the "what ifs" in her life. LCSW encouraged Maureen Randall to continue challenging those thoughts, and not allow herself to go down her anxiety spiral. Maureen Randall expressed understanding and agreement with this idea and process.   Suicidal/Homicidal: No  Therapist Response: Maureen Randall continues to work towards her tx goals but has not yet reached them. We will continue to work towards challenging negative thoughts to decrease anxiety symptoms moving forward.   Plan: Return again in 3 weeks.  Diagnosis: Axis I: Generalized Anxiety Disorder    Axis II: No diagnosis    Alden Hipp, LCSW 11/24/2018

## 2018-12-05 ENCOUNTER — Ambulatory Visit: Payer: Self-pay | Admitting: Internal Medicine

## 2018-12-12 ENCOUNTER — Encounter: Payer: Self-pay | Admitting: Psychiatry

## 2018-12-12 ENCOUNTER — Ambulatory Visit (INDEPENDENT_AMBULATORY_CARE_PROVIDER_SITE_OTHER): Payer: 59 | Admitting: Psychiatry

## 2018-12-12 ENCOUNTER — Other Ambulatory Visit: Payer: Self-pay

## 2018-12-12 DIAGNOSIS — F411 Generalized anxiety disorder: Secondary | ICD-10-CM | POA: Diagnosis not present

## 2018-12-12 DIAGNOSIS — G4701 Insomnia due to medical condition: Secondary | ICD-10-CM

## 2018-12-12 MED ORDER — TRAZODONE HCL 100 MG PO TABS: 50.0000 mg | ORAL_TABLET | Freq: Every evening | ORAL | 1 refills | Status: DC | PRN

## 2018-12-12 NOTE — Progress Notes (Signed)
Virtual Visit via Video Note  I connected with Maureen Randall on 12/12/18 at 11:45 AM EDT by a video enabled telemedicine application and verified that I am speaking with the correct person using two identifiers.   I discussed the limitations of evaluation and management by telemedicine and the availability of in person appointments. The patient expressed understanding and agreed to proceed.     I discussed the assessment and treatment plan with the patient. The patient was provided an opportunity to ask questions and all were answered. The patient agreed with the plan and demonstrated an understanding of the instructions.   The patient was advised to call back or seek an in-person evaluation if the symptoms worsen or if the condition fails to improve as anticipated.   [facility] MD OP Progress Note  12/12/2018 1:12 PM Maureen Randall  MRN:  [id number]  Chief Complaint:  Chief Complaint    Follow-up     HPI: [name] is 53 year old Caucasian female, has a history of generalized anxiety disorder, insomnia, chronic pain, primary osteoarthritis, lumbar radiculopathy, degenerative spondylolisthesis, bilateral carpal tunnel syndrome, recent hip surgery was evaluated by telemedicine today.  Patient today reports she continues to struggle with back pain and lower extremity pain.  She reports she had her MRI done and got a few injections.  She reports she had an injection done on Wednesday and it may be helping a little bit.  She however continues to struggle with the lot of lower extremity pain which does not seem to respond to anything.  She has also started physical therapy and gets it 2 times a week.  She continues to walk with a cane.  Patient reports she is anxious and frustrated about her health, her pain and the situational stressors like the COVID-19.  She continues to be in psychotherapy sessions with Merleen Nicely which are helpful.  She continues to take Cymbalta and reports it helps.  She denies any side  effects.  She reports sleep is very good on the trazodone and she wants to stay on that dosage.  She reports she is trying to look into other options with regards to her job.  She reports she continues to be on long-term disability and has an appointment scheduled with her pain management therapist to see if she can be released back to work.  She is also looking into alternative job options which may be easier for her to handle given her situation.  Patient denies any suicidality, homicidality or perceptual disturbances.  Patient denies any other concerns today.      Visit Diagnosis:    ICD-10-CM   1. GAD (generalized anxiety disorder) F41.1 traZODone (DESYREL) 100 MG tablet  2. Insomnia due to medical condition G47.01 traZODone (DESYREL) 100 MG tablet    Past Psychiatric History: I have reviewed past psychiatric history from my progress note on 04/30/2018.  Past trials of Xanax, diazepam, clonazepam, Pamelor.  Past Medical History:  Past Medical History:  Diagnosis Date  . Allergy   . Anxiety   . Arthritis    osteoarthritis    Past Surgical History:  Procedure Laterality Date  . BREAST BIOPSY Left 2011   neg  . CARPAL TUNNEL RELEASE Right 12/2015   [facility]  . CARPAL TUNNEL RELEASE Left 12/2016  . FOOT SURGERY Right 2015  . HIP ARTHROSCOPY Right 05/2011  . REPLACEMENT TOTAL HIP W/  RESURFACING IMPLANTS Right 12/2017   [place] Right 12/2015   [facility]  .  SPINAL FUSION  2016   [facility]    Family Psychiatric History: I have reviewed family psychiatric history from my progress note on 04/30/2018.  Family History:  Family History  Problem Relation Age of Onset  . Breast cancer Maternal Aunt   . Breast cancer Paternal Aunt     Social History: I have reviewed social history from my progress note on 04/30/2018. Social History   Socioeconomic History  . Marital status: Married    Spouse name: fredrick  . Number of children: 1   . Years of education: Not on file  . Highest education level: High school graduate  Occupational History    Comment: full time  Social Needs  . Financial resource strain: Somewhat hard  . Food insecurity:    Worry: Sometimes true    Inability: Sometimes true  . Transportation needs:    Medical: No    Non-medical: No  Tobacco Use  . Smoking status: Never Smoker  . Smokeless tobacco: Never Used  Substance and Sexual Activity  . Alcohol use: Not Currently  . Drug use: Never  . Sexual activity: Yes  Lifestyle  . Physical activity:    Days per week: 0 days    Minutes per session: 0 min  . Stress: Very much  Relationships  . Social connections:    Talks on phone: Not on file    Gets together: Not on file    Attends religious service: Never    Active member of club or organization: No    Attends meetings of clubs or organizations: Never    Relationship status: Married  Other Topics Concern  . Not on file  Social History Narrative  . Not on file    Allergies:  Allergies  Allergen Reactions  . Amoxicillin Hives  . Cephalexin     rash  . Septra [Sulfamethoxazole-Trimethoprim]     hives    Metabolic Disorder Labs: No results found for: HGBA1C, MPG No results found for: PROLACTIN No results found for: CHOL, TRIG, HDL, CHOLHDL, VLDL, LDLCALC No results found for: TSH  Therapeutic Level Labs: No results found for: LITHIUM No results found for: VALPROATE No components found for:  CBMZ  Current Medications: Current Outpatient Medications  Medication Sig Dispense Refill  . albuterol (PROVENTIL HFA;VENTOLIN HFA) 108 (90 Base) MCG/ACT inhaler INL 2 PFS PO Q 4 TO 6 H PRF WHZ    . aspirin EC 81 MG tablet 1 twice a day with food for 4 weeks.  Do not start until eliquis completed    . azelastine (ASTELIN) 0.1 % nasal spray INSTILL 1 SPRAY IEN D FOR ALLERGIES    . calcium carbonate (OS-CAL - DOSED IN MG OF ELEMENTAL CALCIUM) 1250 (500 Ca) MG tablet Take by mouth.    .  Cholecalciferol (VITAMIN D3) 25 MCG (1000 UT) CAPS Take by mouth.    . clindamycin (CLEOCIN) 300 MG capsule TK 2 CS PO ONE HOUR PRIOR TO PROCEDURE  3  . clotrimazole (MYCELEX) 10 MG troche TAKE 1 TROCHE ORALLY 5 TIMES A DAY X 2 WEEKS    . Cyanocobalamin (B-12 COMPLIANCE INJECTION IJ) Inject as directed every 3 (three) months.    . diclofenac sodium (VOLTAREN) 1 % GEL   11  . DULoxetine (CYMBALTA) 30 MG capsule Take 3 capsules (90 mg total) by mouth daily. 270 capsule 1  . Elastic Bandages & Supports (RELIEF KNEE) MISC Apply in the morning and remove at night.    . estradiol (ESTRACE) 0.1 MG/GM  vaginal cream INSERT PEA SIZED AMOUNT (0.5G) INTO VAGINA NIGHTLY EVERY OTHER NIGHT FOR 2 WEEKS, THEN TWICE WEEKLY    . fluticasone (FLONASE) 50 MCG/ACT nasal spray Place into both nostrils daily.    [name] gabapentin (NEURONTIN) 300 MG capsule TK 1 C PO BID P    . hydrOXYzine (VISTARIL) 25 MG capsule Take 1 capsule (25 mg total) by mouth 3 (three) times daily as needed for anxiety. Only for severe anxiety symptoms 270 capsule 0  . ibuprofen (ADVIL,MOTRIN) 800 MG tablet TK 1 T PO Q 8 H WF PRN P  2  . methocarbamol (ROBAXIN) 500 MG tablet Take 500 mg by mouth every 8 (eight) hours as needed for muscle spasms.    . mometasone (ELOCON) 0.1 % lotion APP EXT AA BID FOR 12 DAYS FOR ITCHY EARS. MAY REPEAT PRF ITCHY EARS    . Multiple Vitamin (MULTI-VITAMINS) TABS Take by mouth.    . Naproxen-Esomeprazole (VIMOVO PO) Take 500 mg by mouth 2 (two) times daily as needed.    . nitrofurantoin (MACRODANTIN) 50 MG capsule Take 50 mg by mouth 4 (four) times daily as needed.    . nystatin (MYCOSTATIN) 100000 UNIT/ML suspension SAS 5 ML PO FOUR TIMES DAILY. CONT 2 DAYS AFTER SYMPTOMS GONE    . oxyCODONE (OXYCONTIN) 10 mg 12 hr tablet TK 1 T PO Q 12 H PRN    . oxyCODONE-acetaminophen (PERCOCET) 7.5-325 MG tablet Take 1 tablet by mouth every 8 (eight) hours as needed for severe pain.    . phentermine 37.5 MG capsule TK 1 C PO QD     . traZODone (DESYREL) 100 MG tablet Take 0.5-1 tablets (50-100 mg total) by mouth at bedtime as needed for sleep. 90 tablet 1  . valACYclovir (VALTREX) 1000 MG tablet     . VIMOVO 500-20 MG TBEC Take 1 tablet by mouth 2 (two) times daily.  2   No current facility-administered medications for this visit.      Musculoskeletal: Strength & Muscle Tone: reports as WNL Gait & Station: Walks with a cane Patient leans: N/A  Psychiatric Specialty Exam: Review of Systems  Musculoskeletal: Positive for back pain and joint pain.  Psychiatric/Behavioral: The patient is nervous/anxious.   All other systems reviewed and are negative.   There were no vitals taken for this visit.There is no height or weight on file to calculate BMI.  General Appearance: Casual  Eye Contact:  Fair  Speech:  Clear and Coherent  Volume:  Normal  Mood:  Anxious  Affect:  Congruent  Thought Process:  Goal Directed and Descriptions of Associations: Intact  Orientation:  Full (Time, Place, and Person)  Thought Content: Logical   Suicidal Thoughts:  No  Homicidal Thoughts:  No  Memory:  Immediate;   Fair Recent;   Fair Remote;   Fair  Judgement:  Fair  Insight:  Fair  Psychomotor Activity:  Normal  Concentration:  Concentration: Fair and Attention Span: Fair  Recall:  [employer] of Knowledge: Fair  Language: Fair  Akathisia:  No  Handed:  Right  AIMS (if indicated): denies tremors, rigidity  Assets:  Communication Skills Desire for Improvement Social Support  ADL's:  Intact  Cognition: WNL  Sleep:  Fair   Screenings: PHQ2-9     Clinical Support from 02/11/2018 in [facility] Visit from 01/29/2018 in [facility]  PHQ-2 Total Score  0  0  Assessment and Plan: Shae is a 53 year old Caucasian female, married, employed, currently on long-term disability, lives in [place], has a history of GAD, insomnia,  chronic pain, osteoarthritis, degenerative spondylolisthesis, lumbar radiculopathy, bilateral carpal tunnel syndrome, multiple surgeries was evaluated by telemedicine today.  Patient continues to have psychosocial stressors of her health issues especially her pain.  Patient also has the stressors of the current COVID-19 outbreak.  Patient will continue to benefit from medications as well as psychotherapy sessions and reports her anxiety symptoms as improving on the current medication regimen.  Plan as noted below.  Plan For GAD-improving Cymbalta 90 mg p.o. daily Hydroxyzine 25 mg p.o. 3 times daily as needed-she rarely uses it. Continue CBT with Ms. Cecilie Lowers.  For insomnia-improving Trazodone 100 mg p.o. nightly as needed  Follow-up in clinic in 2-1/2 months or sooner if needed.  August 12 at 10 AM.  I have spent atleast 15 minutes non face to face with patient today. More than 50 % of the time was spent for psychoeducation and supportive psychotherapy and care coordination.  This note was generated in part or whole with voice recognition software. Voice recognition is usually quite accurate but there are transcription errors that can and very often do occur. I apologize for any typographical errors that were not detected and corrected.        Ursula Alert, MD 12/12/2018, 1:12 PM

## 2018-12-15 ENCOUNTER — Other Ambulatory Visit: Payer: Self-pay

## 2018-12-15 ENCOUNTER — Encounter: Payer: Self-pay | Admitting: Licensed Clinical Social Worker

## 2018-12-15 ENCOUNTER — Ambulatory Visit (INDEPENDENT_AMBULATORY_CARE_PROVIDER_SITE_OTHER): Payer: 59 | Admitting: Licensed Clinical Social Worker

## 2018-12-15 DIAGNOSIS — F411 Generalized anxiety disorder: Secondary | ICD-10-CM

## 2018-12-15 NOTE — Progress Notes (Signed)
Virtual Visit via Video Note  I connected with Maureen Randall on 12/15/18 at  9:00 AM EDT by a video enabled telemedicine application and verified that I am speaking with the correct person using two identifiers.   I discussed the limitations of evaluation and management by telemedicine and the availability of in person appointments. The patient expressed understanding and agreed to proceed.  I discussed the assessment and treatment plan with the patient. The patient was provided an opportunity to ask questions and all were answered. The patient agreed with the plan and demonstrated an understanding of the instructions.   The patient was advised to call back or seek an in-person evaluation if the symptoms worsen or if the condition fails to improve as anticipated.  I provided 45 minutes of non-face-to-face time during this encounter.   Alden Hipp, Maureen    THERAPIST PROGRESS NOTE  Session Time: 0900  Participation Level: Active  Behavioral Response: CasualAlertAnxious  Type of Therapy: Individual Therapy  Treatment Goals addressed: Anxiety  Interventions: CBT  Summary: Maureen Randall is a 53 y.o. female who presents with continued symptoms related to her diagnosis. Maureen Randall reports doing, "alright," since our last session. She reports this morning feeling at her "wits ends." She reported she was not sleeping well due to a new pain management medication, "and I think I jinxed myself because I told Dr. Johnette Abraham that I was sleeping great and now I'm not." Maureen encouraged Maureen Randall to speak with her pain MD about changing the medication in order to allow sleep. This led to a discussion around sleep and how it can impact our mental and physical health in both positive and negative ways. Maureen Randall expressed understanding and agreement with this idea. Maureen Randall went on to discuss she was feeling anxious about returning to work, but is able to talk herself down when she starts to feel anxious. She reports talking  to HR about what was going on, and they were reassuring and encouraged her to apply for long term disability. Maureen Randall reported that made her feel more at ease in other areas of her life as well. Maureen Randall's effort to control the aspects of the situation she could. 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 Maureen Randall expressed agreement. Maureen Randall reports she is still journaling and making an effort to challenge negative thoughts.   Suicidal/Homicidal: No  Therapist Response: Maureen Randall continues to work towards her tx goals but has not yet reached them. We will continue to work on challenging negative thoughts.   Plan: Return again in 3 weeks.  Diagnosis: Axis I: Generalized Anxiety Disorder    Axis II: No diagnosis    Alden Hipp, Maureen 12/15/2018

## 2018-12-25 ENCOUNTER — Encounter

## 2018-12-25 ENCOUNTER — Other Ambulatory Visit: Payer: Self-pay

## 2018-12-25 ENCOUNTER — Encounter: Payer: Self-pay | Admitting: Internal Medicine

## 2018-12-25 ENCOUNTER — Other Ambulatory Visit: Payer: Self-pay | Admitting: Psychiatry

## 2018-12-25 ENCOUNTER — Ambulatory Visit (INDEPENDENT_AMBULATORY_CARE_PROVIDER_SITE_OTHER): Payer: 59 | Admitting: Internal Medicine

## 2018-12-25 DIAGNOSIS — N390 Urinary tract infection, site not specified: Secondary | ICD-10-CM

## 2018-12-25 DIAGNOSIS — E669 Obesity, unspecified: Secondary | ICD-10-CM | POA: Diagnosis not present

## 2018-12-25 DIAGNOSIS — E559 Vitamin D deficiency, unspecified: Secondary | ICD-10-CM

## 2018-12-25 DIAGNOSIS — B009 Herpesviral infection, unspecified: Secondary | ICD-10-CM

## 2018-12-25 DIAGNOSIS — F411 Generalized anxiety disorder: Secondary | ICD-10-CM

## 2018-12-25 DIAGNOSIS — D649 Anemia, unspecified: Secondary | ICD-10-CM

## 2018-12-25 DIAGNOSIS — G894 Chronic pain syndrome: Secondary | ICD-10-CM

## 2018-12-25 DIAGNOSIS — E538 Deficiency of other specified B group vitamins: Secondary | ICD-10-CM

## 2018-12-25 DIAGNOSIS — R739 Hyperglycemia, unspecified: Secondary | ICD-10-CM

## 2018-12-25 DIAGNOSIS — Z0000 Encounter for general adult medical examination without abnormal findings: Secondary | ICD-10-CM

## 2018-12-25 DIAGNOSIS — G4701 Insomnia due to medical condition: Secondary | ICD-10-CM

## 2018-12-25 DIAGNOSIS — Z1329 Encounter for screening for other suspected endocrine disorder: Secondary | ICD-10-CM

## 2018-12-25 DIAGNOSIS — K219 Gastro-esophageal reflux disease without esophagitis: Secondary | ICD-10-CM | POA: Insufficient documentation

## 2018-12-25 DIAGNOSIS — Z13220 Encounter for screening for lipoid disorders: Secondary | ICD-10-CM

## 2018-12-25 NOTE — Patient Instructions (Addendum)
Look into the next 56 days nutrition plan to lose weight only   Recombinant Zoster (Shingles) Vaccine, RZV: What You Need to Know -->Shingrix x 2 doses 2nd dose 2 to < 6 months of 1st   1. Why get vaccinated? Shingles (also called herpes zoster, or just zoster) is a painful skin rash, often with blisters. Shingles is caused by the varicella zoster virus, the same virus that causes chickenpox. After you have chickenpox, the virus stays in your body and can cause shingles later in life. You can't catch shingles from another person. However, a person who has never had chickenpox (or chickenpox vaccine) could get chickenpox from someone with shingles. A shingles rash usually appears on one side of the face or body and heals within 2 to 4 weeks. Its main symptom is pain, which can be severe. Other symptoms can include fever, headache, chills, and upset stomach. Very rarely, a shingles infection can lead to pneumonia, hearing problems, blindness, brain inflammation (encephalitis), or death. For about 1 person in 5, severe pain can continue even long after the rash has cleared up. This long-lasting pain is called post-herpetic neuralgia (PHN). Shingles is far more common in people 53 years of age and older than in younger people, and the risk increases with age. It is also more common in people whose immune system is weakened because of a disease such as cancer, or by drugs such as steroids or chemotherapy. At least 1 million people a year in the [place] get shingles. 2. Shingles vaccine (recombinant) Recombinant shingles vaccine was approved by FDA in 2017 for the prevention of shingles. In clinical trials, it was more than 90% effective in preventing shingles. It can also reduce the likelihood of PHN. Two doses, 2 to 6 months apart, are recommended for adults 53 and older. This vaccine is also recommended for people who have already gotten the live shingles vaccine (Zostavax). There is no live  virus in this vaccine. 3. Some people should not get this vaccine Tell your vaccine provider if you:  Have any severe, life-threatening allergies. A person who has ever had a life-threatening allergic reaction after a dose of recombinant shingles vaccine, or has a severe allergy to any component of this vaccine, may be advised not to be vaccinated. Ask your health care provider if you want information about vaccine components.  Are pregnant or breastfeeding. There is not much information about use of recombinant shingles vaccine in pregnant or nursing women. Your healthcare provider might recommend delaying vaccination.  Are not feeling well. If you have a mild illness, such as a cold, you can probably get the vaccine today. If you are moderately or severely ill, you should probably wait until you recover. Your doctor can advise you. 4. Risks of a vaccine reaction With any medicine, including vaccines, there is a chance of reactions. After recombinant shingles vaccination, a person might experience:  Pain, redness, soreness, or swelling at the site of the injection  Headache, muscle aches, fever, shivering, fatigue In clinical trials, most people got a sore arm with mild or moderate pain after vaccination, and some also had redness and swelling where they got the shot. Some people felt tired, had muscle pain, a headache, shivering, fever, stomach pain, or nausea. About 1 out of 6 people who got recombinant zoster vaccine experienced side effects that prevented them from doing regular activities. Symptoms went away on their own in about 2 to 3 days. Side effects were more common in younger 53 people. You should still get the second dose of recombinant zoster vaccine even if you had one of these reactions after the first dose. Other things that could happen after this vaccine:  People sometimes faint after medical procedures, including vaccination. Sitting or lying down for about 15 minutes can help  prevent fainting and injuries caused by a fall. Tell your provider if you feel dizzy or have vision changes or ringing in the ears.  Some people get shoulder pain that can be more severe and longer-lasting than routine soreness that can follow injections. This happens very rarely.  Any medication can cause a severe allergic reaction. Such reactions to a vaccine are estimated at about 1 in a million doses, and would happen within a few minutes to a few hours after the vaccination. As with any medicine, there is a very remote chance of a vaccine causing a serious injury or death. The safety of vaccines is always being monitored. For more information, visit: http://www.aguilar.org/ 5. What if there is a serious problem? What should I look for?  Look for anything that concerns you, such as signs of a severe allergic reaction, very high fever, or unusual behavior. Signs of a severe allergic reaction can include hives, swelling of the face and throat, difficulty breathing, a fast heartbeat, dizziness, and weakness. These would usually start a few minutes to a few hours after the vaccination. What should I do?  If you think it is a severe allergic reaction or other emergency that can't wait, call 9-1-1 or get to the nearest hospital. Otherwise, call your health care provider. Afterward, the reaction should be reported to the Vaccine Adverse Event Reporting System (VAERS). Your doctor should file this report, or you can do it yourself through the VAERS website at www.vaers.SamedayNews.es, or by calling (787)579-7445. VAERS does not give medical advice. 6. How can I learn more?  Ask your health care provider. He or she can give you the vaccine package insert or suggest other sources of information.  Call your local or state health department.  Contact the Centers for Disease Control and Prevention (CDC): ? Call 530 466 6763 (1-800-CDC-INFO) or ? Visit CDC's vaccines website at http://hunter.com/  CDC Vaccine Information Statement Recombinant Zoster Vaccine (08/20/2016) This information is not intended to replace advice given to you by your health care provider. Make sure you discuss any questions you have with your health care provider. Document Released: 09/04/2016 Document Revised: 01/29/2018 Document Reviewed: 01/29/2018 Elsevier Interactive Patient Education  2019 [employer].  Colonoscopy, Adult A colonoscopy is an exam to look at the entire large intestine. During the exam, a lubricated, flexible tube that has a camera on the end of it is inserted into the anus and then passed into the rectum, colon, and other parts of the large intestine. You may have a colonoscopy as a part of normal colorectal screening or if you have certain symptoms, such as:  Lack of red blood cells (anemia).  Diarrhea that does not go away.  Abdominal pain.  Blood in your stool (feces). A colonoscopy can help screen for and diagnose medical problems, including:  Tumors.  Polyps.  Inflammation.  Areas of bleeding. Tell a health care provider about:  Any allergies you have.  All medicines you are taking, including vitamins, herbs, eye drops, creams, and over-the-counter medicines.  Any problems you or family members have had with anesthetic medicines.  Any blood disorders you have.  Any surgeries you have had.  Any medical conditions you have.  Any problems you have had passing stool. What are the risks? Generally, this is a safe procedure. However, problems may occur, including:  Bleeding.  A tear in the intestine.  A reaction to medicines given during the exam.  Infection (rare). What happens before the procedure? Eating and drinking restrictions Follow instructions from your health care provider about eating and drinking, which may include:  A few days before the procedure - follow a low-fiber diet. Avoid nuts, seeds, dried fruit, raw fruits, and vegetables.  1-3 days  before the procedure - follow a clear liquid diet. Drink only clear liquids, such as clear broth or bouillon, black coffee or tea, clear juice, clear soft drinks or sports drinks, gelatin dessert, and popsicles. Avoid any liquids that contain red or purple dye.  On the day of the procedure - do not eat or drink anything starting 2 hours before the procedure, or within the time period that your health care provider recommends. Up to 2 hours before the procedure, you may continue to drink clear liquids, such as water or clear fruit juice. Bowel prep If you were prescribed an oral bowel prep to clean out your colon:  Take it as told by your health care provider. Starting the day before your procedure, you will need to drink a large amount of medicated liquid. The liquid will cause you to have multiple loose stools until your stool is almost clear or light green.  If your skin or anus gets irritated from diarrhea, you may use these to relieve the irritation: ? Medicated wipes, such as adult wet wipes with aloe and vitamin E. ? A skin-soothing product like petroleum jelly.  If you vomit while drinking the bowel prep, take a break for up to 60 minutes and then begin the bowel prep again. If vomiting continues and you cannot take the bowel prep without vomiting, call your health care provider.  To clean out your colon, you may also be given: ? Laxative medicines. ? Instructions about how to use an enema. General instructions  Ask your health care provider about: ? Changing or stopping your regular medicines or supplements. This is especially important if you are taking iron supplements, diabetes medicines, or blood thinners. ? Taking medicines such as aspirin and ibuprofen. These medicines can thin your blood. Do not take these medicines before the procedure if your health care provider tells you not to.  Plan to have someone take you home from the hospital or clinic. What happens during the  procedure?   An IV may be inserted into one of your veins.  You will be given medicine to help you relax (sedative).  To reduce your risk of infection: ? Your health care team will wash or sanitize their hands. ? Your anal area will be washed with soap.  You will be asked to lie on your side with your knees bent.  Your health care provider will lubricate a long, thin, flexible tube. The tube will have a camera and a light on the end.  The tube will be inserted into your anus.  The tube will be gently eased through your rectum and colon.  Air will be delivered into your colon to keep it open. You may feel some pressure or cramping.  The camera will be used to take images during the procedure.  A small tissue sample may be removed to be examined under a microscope (biopsy).  If small polyps are found, your health care provider may remove them and  have them checked for cancer cells.  When the exam is done, the tube will be removed. The procedure may vary among health care providers and hospitals. What happens after the procedure?  Your blood pressure, heart rate, breathing rate, and blood oxygen level will be monitored until the medicines you were given have worn off.  Do not drive for 24 hours after the exam.  You may have a small amount of blood in your stool.  You may pass gas and have mild abdominal cramping or bloating due to the air that was used to inflate your colon during the exam.  It is up to you to get the results of your procedure. Ask your health care provider, or the department performing the procedure, when your results will be ready. Summary  A colonoscopy is an exam to look at the entire large intestine.  During a colonoscopy, a lubricated, flexible tube with a camera on the end of it is inserted into the anus and then passed into the colon and other parts of the large intestine.  Follow instructions from your health care provider about eating and drinking  before the procedure.  If you were prescribed an oral bowel prep to clean out your colon, take it as told by your health care provider.  After your procedure, your blood pressure, heart rate, breathing rate, and blood oxygen level will be monitored until the medicines you were given have worn off. This information is not intended to replace advice given to you by your health care provider. Make sure you discuss any questions you have with your health care provider. Document Released: 06/22/2000 Document Revised: 04/17/2017 Document Reviewed: 09/06/2015 Elsevier Interactive Patient Education  2019 [employer].

## 2018-12-25 NOTE — Progress Notes (Signed)
Virtual Visit via Video Note  I connected with Maureen Randall  on 12/25/18 at  2:35 PM EDT by a video enabled telemedicine application and verified that I am speaking with the correct person using two identifiers.  Location patient: home Location provider:work or home office Persons participating in the virtual visit: patient, provider  I discussed the limitations of evaluation and management by telemedicine and the availability of in person appointments. The patient expressed understanding and agreed to proceed.   HPI: 1. Chronic pain low back, neck, hip she has had 5 injections and has another 12/30/18 with the pain clinic. Oxycontin 10 mg q12 did not work with percocet tid so pain clinic will increase percocet 7.5-325 qid, Vimovo 500-20 bid. She is currently in PT and reports shooting pain right low back and right thigh and right hip. She had had Xrays and MRIs at [facility] will get records.  2. Depression-she f/u with psych and therapy on cymbalta mood controlled but at times worse with chronic pain  3. 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 Reports ? COVID 08/2018 had URI/bronchitis x 5-6 weeks tried 3 Abx and 2 steroids CXR 09/04/18 normal   ROS: See pertinent positives and negatives per HPI.  Past Medical History:  Diagnosis Date  . Anxiety   . Arthritis    osteoarthritis  . GERD (gastroesophageal reflux disease)     Past Surgical History:  Procedure Laterality Date  . ABLATION     veins in lower legs  . BREAST BIOPSY Left 2011   neg  . CARPAL TUNNEL RELEASE Right 12/2015   [facility]  . CARPAL TUNNEL RELEASE Left 12/2016  . FOOT SURGERY Right 2015   broken navicular bone  . HIP ARTHROSCOPY Right 05/2011  . HIP SURGERY     left hip surgery 05/2018  . REPLACEMENT TOTAL HIP W/  RESURFACING IMPLANTS Right 12/2017   [place] Right 12/2015   [facility] torn rotator cuff  . SPINAL FUSION  2016   [facility] lumbar    Family History  Problem Relation Age of Onset  . Breast cancer Maternal Aunt   . Breast cancer Paternal Aunt   . COPD Mother        quit smoking after 60 years  . Dementia Father     SOCIAL HX: 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  Married 1 child     Current Outpatient Medications:  .  albuterol (PROVENTIL HFA;VENTOLIN HFA) 108 (90 Base) MCG/ACT inhaler, INL 2 PFS PO Q 4 TO 6 H PRF WHZ, Disp: , Rfl:  .  aspirin EC 81 MG tablet, 1 twice a day with food for 4 weeks.  Do not start until eliquis completed, Disp: , Rfl:  .  azelastine (ASTELIN) 0.1 % nasal spray, INSTILL 1 SPRAY IEN D FOR ALLERGIES, Disp: , Rfl:  .  calcium carbonate (OS-CAL - DOSED IN MG OF ELEMENTAL CALCIUM) 1250 (500 Ca) MG tablet, Take by mouth., Disp: , Rfl:  .  Cholecalciferol (VITAMIN D3) 25 MCG (1000 UT) CAPS, Take by mouth., Disp: , Rfl:  .  Cyanocobalamin (B-12 COMPLIANCE INJECTION IJ), Inject as directed every 3 (three) months., Disp: , Rfl:  .  diclofenac sodium (VOLTAREN) 1 % GEL, , Disp: , Rfl: 11 .  DULoxetine (CYMBALTA) 30 MG capsule, Take 3 capsules (90 mg total) by mouth daily., Disp: 270 capsule, Rfl: 1 .  Elastic Bandages & Supports (RELIEF KNEE) MISC, Apply in the morning and remove at night., Disp: , Rfl:  .  estradiol (ESTRACE) 0.1 MG/GM vaginal cream, INSERT PEA SIZED AMOUNT (0.5G) INTO VAGINA NIGHTLY EVERY OTHER NIGHT FOR 2 WEEKS, THEN TWICE WEEKLY, Disp: , Rfl:  .  fluticasone (FLONASE) 50 MCG/ACT nasal spray, Place into both nostrils daily., Disp: , Rfl:  .  gabapentin (NEURONTIN) 300 MG capsule, TK 1 C PO BID P, Disp: , Rfl:  .  hydrOXYzine (VISTARIL) 25 MG capsule, Take 1 capsule (25 mg total) by mouth 3 (three) times daily as needed for anxiety. Only for severe anxiety symptoms, Disp: 270 capsule, Rfl: 0 .  ibuprofen (ADVIL,MOTRIN) 800 MG tablet, TK 1  T PO Q 8 H WF PRN P, Disp: , Rfl: 2 .  methocarbamol (ROBAXIN) 500 MG tablet, Take 500 mg by mouth every 8 (eight) hours as needed for muscle spasms., Disp: , Rfl:  .  mometasone (ELOCON) 0.1 % lotion, APP EXT AA BID FOR 12 DAYS FOR ITCHY EARS. MAY REPEAT PRF ITCHY EARS, Disp: , Rfl:  .  Multiple Vitamin (MULTI-VITAMINS) TABS, Take by mouth., Disp: , Rfl:  .  nitrofurantoin (MACRODANTIN) 50 MG capsule, Take 50 mg by mouth as needed. UTI dose should be 100 mg bid x 5 days UTI  or 50-100 mg qhs prevention, Disp: , Rfl:  .  oxyCODONE-acetaminophen (PERCOCET) 7.5-325 MG tablet, Take 1 tablet by mouth every 6 (six) hours as needed for severe pain. Dr. Alvira Monday pain clinic [facility], Disp: , Rfl:  .  phentermine 37.5 MG capsule, TK 1 C PO QD, Disp: , Rfl:  .  traZODone (DESYREL) 100 MG tablet, Take 0.5-1 tablets (50-100 mg total) by mouth at bedtime as needed for sleep., Disp: 90 tablet, Rfl: 1 .  valACYclovir (VALTREX) 1000 MG tablet, Oral blister, Disp: , Rfl:  .  VIMOVO 500-20 MG TBEC, Take 1 tablet by mouth 2 (two) times daily., Disp: , Rfl: 2  EXAM:  VITALS per patient if applicable:  GENERAL: alert, oriented, appears well and in no acute distress  HEENT: atraumatic, conjunttiva clear, no obvious abnormalities on inspection of external nose and ears  NECK: normal movements of the head and neck  LUNGS: on inspection no signs of respiratory distress, breathing rate appears normal, no obvious gross SOB, gasping or wheezing  CV: no obvious cyanosis  MS: moves all visible extremities without noticeable abnormality  PSYCH/NEURO: pleasant and cooperative, no obvious depression or anxiety, speech and thought processing grossly intact  ASSESSMENT AND PLAN:  Discussed the following assessment and plan:  Chronic pain syndrome - Plan: f/u pain clinic get records [facility]  Cont meds   Gastroesophageal reflux disease, esophagitis presence not specified - Plan: on ppi bid   Recurrent UTI -  Plan: Urinalysis, Routine w reflex microscopic, Urine Culture, prn Macrobid   Obesity (BMI 30-39.9) - Plan: will call back for adipex when needed will do 4 months on and 3 months off   HSV infection - Plan: prn Valtrex   Hyperglycemia - Plan: Hemoglobin A1c  B12 deficiency - Plan: B12,   Vitamin D deficiency - Plan: Vitamin D (25 hydroxy),  HM Fasting labs at [facility] labs   Declines flu shot  Tdap due possibly will check [facility] Disc shingrix today  Consider hep B vaccine in future NR 08/31/15  HCV neg  08/31/15   Pap neg 09/11/16 neg neg HPV  Will f/u Dr. Leafy Ro has IUD upcoming appt  Mammogram 08/05/18 negative  Colonoscopy never had discussed with pt today she will think about it  Dermatology was due 09/2018 f/u usu. Yearly [facility] Never smoker   Other providers Former PCP [facility]. Garen Grams then [facility] requested all imaging Xrays, MRIS, CT Pain Dr. Alvira Monday in [place] specialist Dr. Rennis Harding will establish  ENT-Dr. Tami Ribas stated pt did not have allergies of note had testing Psychiatry and therapy-following  PT  Of note meds pt gets from PCP  Valtrex  Vimovo  adipex  Macrobid  Robaxin  voltaren gel   I discussed the assessment and treatment plan with the patient. The patient was provided an opportunity to ask questions and all were answered. The patient agreed with the plan and demonstrated an understanding of the instructions.   The patient was advised to call back or seek an in-person evaluation if the symptoms worsen or if the condition fails to improve as anticipated.  Time spent 25 minutes Delorise Jackson, MD

## 2018-12-26 DIAGNOSIS — E538 Deficiency of other specified B group vitamins: Secondary | ICD-10-CM | POA: Insufficient documentation

## 2018-12-26 DIAGNOSIS — E559 Vitamin D deficiency, unspecified: Secondary | ICD-10-CM | POA: Insufficient documentation

## 2019-01-02 ENCOUNTER — Encounter: Payer: Self-pay | Admitting: Internal Medicine

## 2019-01-02 ENCOUNTER — Telehealth: Payer: Self-pay | Admitting: Internal Medicine

## 2019-01-02 ENCOUNTER — Telehealth: Payer: Self-pay | Admitting: *Deleted

## 2019-01-02 NOTE — Addendum Note (Signed)
Addended by: Orland Mustard on: 01/02/2019 12:23 PM   Modules accepted: Orders

## 2019-01-02 NOTE — Telephone Encounter (Signed)
Patient is coming into office for labs Monday need to be for Rives harvest.

## 2019-01-02 NOTE — Telephone Encounter (Signed)
Fasting labs ordered [facility] she can just walk in

## 2019-01-02 NOTE — Telephone Encounter (Signed)
Please place future orders for lab appt. All of the orders are active & can not be accessed in the lab.  FYI, the PCP needs to be updated in her chart

## 2019-01-05 ENCOUNTER — Other Ambulatory Visit (INDEPENDENT_AMBULATORY_CARE_PROVIDER_SITE_OTHER): Payer: 59

## 2019-01-05 ENCOUNTER — Encounter: Payer: Self-pay | Admitting: Licensed Clinical Social Worker

## 2019-01-05 ENCOUNTER — Ambulatory Visit (INDEPENDENT_AMBULATORY_CARE_PROVIDER_SITE_OTHER): Payer: 59 | Admitting: Licensed Clinical Social Worker

## 2019-01-05 ENCOUNTER — Other Ambulatory Visit: Payer: Self-pay

## 2019-01-05 DIAGNOSIS — E559 Vitamin D deficiency, unspecified: Secondary | ICD-10-CM

## 2019-01-05 DIAGNOSIS — R739 Hyperglycemia, unspecified: Secondary | ICD-10-CM | POA: Diagnosis not present

## 2019-01-05 DIAGNOSIS — N390 Urinary tract infection, site not specified: Secondary | ICD-10-CM

## 2019-01-05 DIAGNOSIS — F411 Generalized anxiety disorder: Secondary | ICD-10-CM

## 2019-01-05 DIAGNOSIS — Z13220 Encounter for screening for lipoid disorders: Secondary | ICD-10-CM | POA: Diagnosis not present

## 2019-01-05 DIAGNOSIS — Z0000 Encounter for general adult medical examination without abnormal findings: Secondary | ICD-10-CM

## 2019-01-05 DIAGNOSIS — D649 Anemia, unspecified: Secondary | ICD-10-CM | POA: Diagnosis not present

## 2019-01-05 DIAGNOSIS — E538 Deficiency of other specified B group vitamins: Secondary | ICD-10-CM

## 2019-01-05 DIAGNOSIS — Z1329 Encounter for screening for other suspected endocrine disorder: Secondary | ICD-10-CM

## 2019-01-05 LAB — CBC WITH DIFFERENTIAL/PLATELET
Basophils Absolute: 0 10*3/uL (ref 0.0–0.1)
Basophils Relative: 0.4 % (ref 0.0–3.0)
Eosinophils Absolute: 0.1 10*3/uL (ref 0.0–0.7)
Eosinophils Relative: 1.4 % (ref 0.0–5.0)
HCT: 41.4 % (ref 36.0–46.0)
Hemoglobin: 13.7 g/dL (ref 12.0–15.0)
Lymphocytes Relative: 25.3 % (ref 12.0–46.0)
Lymphs Abs: 1.9 10*3/uL (ref 0.7–4.0)
MCHC: 33.1 g/dL (ref 30.0–36.0)
MCV: 96.9 fl (ref 78.0–100.0)
Monocytes Absolute: 0.7 10*3/uL (ref 0.1–1.0)
Monocytes Relative: 8.9 % (ref 3.0–12.0)
Neutro Abs: 4.8 10*3/uL (ref 1.4–7.7)
Neutrophils Relative %: 64 % (ref 43.0–77.0)
Platelets: 450 10*3/uL — ABNORMAL HIGH (ref 150.0–400.0)
RBC: 4.28 Mil/uL (ref 3.87–5.11)
RDW: 13.7 % (ref 11.5–15.5)
WBC: 7.5 10*3/uL (ref 4.0–10.5)

## 2019-01-05 LAB — VITAMIN D 25 HYDROXY (VIT D DEFICIENCY, FRACTURES): VITD: 48.02 ng/mL (ref 30.00–100.00)

## 2019-01-05 LAB — HEMOGLOBIN A1C: Hgb A1c MFr Bld: 5.7 % (ref 4.6–6.5)

## 2019-01-05 LAB — TSH: TSH: 3.78 u[IU]/mL (ref 0.35–4.50)

## 2019-01-05 LAB — VITAMIN B12: Vitamin B-12: 606 pg/mL (ref 211–911)

## 2019-01-05 NOTE — Progress Notes (Signed)
Virtual Visit via Video Note  I connected with [name] on 01/05/19 at  9:00 AM EDT by a video enabled telemedicine application and verified that I am speaking with the correct person using two identifiers.   I discussed the limitations of evaluation and management by telemedicine and the availability of in person appointments. The patient expressed understanding and agreed to proceed.  I discussed the assessment and treatment plan with the patient. The patient was provided an opportunity to ask questions and all were answered. The patient agreed with the plan and demonstrated an understanding of the instructions.   The patient was advised to call back or seek an in-person evaluation if the symptoms worsen or if the condition fails to improve as anticipated.  I provided 40 minutes of non-face-to-face time during this encounter.   Alden Hipp, LCSW    THERAPIST PROGRESS NOTE  Session Time: 0900  Participation Level: Active  Behavioral Response: CasualAlertAnxious  Type of Therapy: Individual Therapy  Treatment Goals addressed: Anxiety  Interventions: CBT  Summary: ZAHRIYAH JOO is a 53 y.o. female who presents with continued symptoms related to her diagnosis. Amiliah reports doing well since our last session. She reports ongoing anxiety around her physical health but reports she has recently scheduled an appointment with another doctor to get a second opinion about her back. LCSW validated and normalized feelings of anxiety associated with physical health, and validated the idea of taking control of the aspects she's able to (getting a second opinion). Karin reported she has been trying to do that in all areas of her life as instructed by LCSW. She reported she has also been doing distraction techniques when feeling overwhelmed, but did not feel this was an appropriate coping skills. LCSW challenged that idea, and encouraged Leza to recognize distraction as a healthy way to take  a break from the anxiety producing situation. Whitni was able to recognize this and expressed understanding of how taking a moment can be beneficial to an anxiety producing situation. Further, we discussed how Amberleigh often views anxiety as a weakness. We discussed how Nalany can shift that thought and view it as a motivating strength. Ikram was able to understand this after a brief discussion.   Suicidal/Homicidal: No  Therapist Response: Mistie continues to work towards her tx goals but has not yet reached them. We will continue to work towards challenging negative thoughts and reframing thoughts to manage anxiety in the moment.   Plan: Return again in 4 weeks.  Diagnosis: Axis I: Generalized Anxiety Disorder    Axis II: No diagnosis    Alden Hipp, LCSW 01/05/2019

## 2019-01-06 LAB — LIPID PANEL
Cholesterol: 250 mg/dL — ABNORMAL HIGH (ref 0–200)
HDL: 75.9 mg/dL (ref >39.00)
LDL Cholesterol: 156 mg/dL — ABNORMAL HIGH (ref 0–99)
NonHDL: 174.51
Total CHOL/HDL Ratio: 3
Triglycerides: 93 mg/dL (ref 0.0–149.0)
VLDL: 18.6 mg/dL (ref 0.0–40.0)

## 2019-01-06 LAB — COMPREHENSIVE METABOLIC PANEL
ALT: 24 U/L (ref 0–35)
AST: 15 U/L (ref 0–37)
Albumin: 4.5 g/dL (ref 3.5–5.2)
Alkaline Phosphatase: 67 U/L (ref 39–117)
BUN: 30 mg/dL — ABNORMAL HIGH (ref 6–23)
CO2: 28 mEq/L (ref 19–32)
Calcium: 9.8 mg/dL (ref 8.4–10.5)
Chloride: 100 mEq/L (ref 96–112)
Creatinine, Ser: 0.83 mg/dL (ref 0.40–1.20)
GFR: 71.79 mL/min (ref >60.00)
Glucose, Bld: 96 mg/dL (ref 70–99)
Potassium: 4.4 mEq/L (ref 3.5–5.1)
Sodium: 139 mEq/L (ref 135–145)
Total Bilirubin: 0.4 mg/dL (ref 0.2–1.2)
Total Protein: 7.5 g/dL (ref 6.0–8.3)

## 2019-01-06 LAB — IBC + FERRITIN
Ferritin: 36.4 ng/mL (ref 10.0–291.0)
Iron: 53 ug/dL (ref 42–145)
Saturation Ratios: 12.2 % — ABNORMAL LOW (ref 20.0–50.0)
Transferrin: 310 mg/dL (ref 212.0–360.0)

## 2019-01-07 LAB — URINALYSIS, ROUTINE W REFLEX MICROSCOPIC
Bilirubin Urine: NEGATIVE
Glucose, UA: NEGATIVE
Hgb urine dipstick: NEGATIVE
Ketones, ur: NEGATIVE
Leukocytes,Ua: NEGATIVE
Nitrite: NEGATIVE
Protein, ur: NEGATIVE
Specific Gravity, Urine: 1.033 (ref 1.001–1.03)
pH: <=5 (ref 5.0–8.0)

## 2019-01-07 LAB — URINE CULTURE
MICRO NUMBER:: 616771
Result:: NO GROWTH
SPECIMEN QUALITY:: ADEQUATE

## 2019-01-08 ENCOUNTER — Encounter: Payer: Self-pay | Admitting: Internal Medicine

## 2019-01-08 ENCOUNTER — Other Ambulatory Visit: Payer: Self-pay | Admitting: Internal Medicine

## 2019-01-08 DIAGNOSIS — J42 Unspecified chronic bronchitis: Secondary | ICD-10-CM

## 2019-01-08 DIAGNOSIS — R238 Other skin changes: Secondary | ICD-10-CM

## 2019-01-08 DIAGNOSIS — D473 Essential (hemorrhagic) thrombocythemia: Secondary | ICD-10-CM

## 2019-01-08 DIAGNOSIS — D75839 Thrombocytosis, unspecified: Secondary | ICD-10-CM

## 2019-01-08 DIAGNOSIS — R233 Spontaneous ecchymoses: Secondary | ICD-10-CM

## 2019-01-08 DIAGNOSIS — R0602 Shortness of breath: Secondary | ICD-10-CM

## 2019-01-08 DIAGNOSIS — R790 Abnormal level of blood mineral: Secondary | ICD-10-CM

## 2019-01-13 ENCOUNTER — Telehealth: Payer: Self-pay | Admitting: Internal Medicine

## 2019-01-13 NOTE — Telephone Encounter (Signed)
/  Called patient for COVID-19 pre-screening for in office visit.  Have you recently traveled any where out of the local area in the last 2 weeks? No  Have you been in close contact with a person diagnosed with COVID-19 or someone awaiting results within the last 2 weeks? No  Do you currently have any of the following symptoms? If so, when did they start? Cough     Diarrhea   Joint Pain Fever      Muscle Pain   Red eyes Shortness of breath   Abdominal pain  Vomiting Loss of smell    Rash    Sore Throat Headache    Weakness   Bruising or bleeding  Okay to proceed with visit 01/15/2019

## 2019-01-15 ENCOUNTER — Other Ambulatory Visit: Payer: Self-pay

## 2019-01-15 ENCOUNTER — Ambulatory Visit (INDEPENDENT_AMBULATORY_CARE_PROVIDER_SITE_OTHER): Payer: 59 | Admitting: Internal Medicine

## 2019-01-15 ENCOUNTER — Encounter: Payer: Self-pay | Admitting: Internal Medicine

## 2019-01-15 VITALS — BP 124/82 | HR 86 | Temp 97.6°F | Ht 67.0 in | Wt 220.8 lb

## 2019-01-15 DIAGNOSIS — J4540 Moderate persistent asthma, uncomplicated: Secondary | ICD-10-CM

## 2019-01-15 DIAGNOSIS — R0602 Shortness of breath: Secondary | ICD-10-CM | POA: Diagnosis not present

## 2019-01-15 NOTE — Progress Notes (Signed)
Name: Maureen Randall MRN: [id number] DOB: [birth year]     CONSULTATION DATE: 01/15/2019 REFERRING MD : Aundra Dubin  CHIEF COMPLAINT: SOB  HISTORY OF PRESENT ILLNESS: 53 year old obese white female seen today for shortness of breath for many years But recurrent bronchitis every year for the last 15 years Does not have any wheezing at this time however does have intermittent cough Recent bout of bronchitis was in February of this year she had several rounds of antibiotics and prednisone subsequently developed thrush Patient was given albuterol inhaler which helps a little  Patient does have reaction to cold air smells perfumes dogs cats very sensitive  This time I explained to patient she has signs symptoms of asthma She does have allergic rhinitis Astelin sprays and Flonase does help a little Claritin and Zyrtec do not help She takes an over-the-counter nasal decongestant which helps a lot  She has a family history of asthma  At this time there is no signs symptoms of acute asthma exacerbation No signs symptoms of infection at this time  Patient is a non-smoker however had secondhand smoke exposure as a child She works as a Scientist, research (medical)  Has a history of bilateral hip replacements  Patient sees ENT for allergic rhinitis and sinus infections Chest x-rays have been completed however they are not in the chart Patient had sinus x-rays in March 2020 which did not show any significant changes     PAST MEDICAL HISTORY :   has a past medical history of Anxiety, Arthritis, and GERD (gastroesophageal reflux disease).  has a past surgical history that includes Hip arthroscopy (Right, 05/2011); Foot surgery (Right, 2015); Spinal fusion (2016); Shoulder surgery (Right, 12/2015); Carpal tunnel release (Right, 12/2015); Carpal tunnel release (Left, 12/2016); Replacement total hip w/  resurfacing implants (Right, 12/2017); Breast biopsy (Left, 2011); Hip surgery; and Ablation. Prior to  Admission medications   Medication Sig Start Date End Date Taking? Authorizing Provider  albuterol (PROVENTIL HFA;VENTOLIN HFA) 108 (90 Base) MCG/ACT inhaler INL 2 PFS PO Q 4 TO 6 H PRF WHZ 09/16/18   [provider]  aspirin EC 81 MG tablet 1 twice a day with food for 4 weeks.  Do not start until eliquis completed 12/21/17   [provider]  azelastine (ASTELIN) 0.1 % nasal spray INSTILL 1 SPRAY IEN D FOR ALLERGIES 10/22/18   [provider]  calcium carbonate (OS-CAL - DOSED IN MG OF ELEMENTAL CALCIUM) 1250 (500 Ca) MG tablet Take by mouth.    [provider]  Cholecalciferol (VITAMIN D3) 25 MCG (1000 UT) CAPS Take by mouth.    [provider]  Cyanocobalamin (B-12 COMPLIANCE INJECTION IJ) Inject as directed every 3 (three) months.    [provider]  diclofenac sodium (VOLTAREN) 1 % GEL  04/02/18   [provider]  DULoxetine (CYMBALTA) 30 MG capsule Take 3 capsules (90 mg total) by mouth daily. 11/11/18   Ursula Alert, MD  Elastic Bandages & Supports (RELIEF KNEE) MISC Apply in the morning and remove at night. 07/15/13   [provider]  estradiol (ESTRACE) 0.1 MG/GM vaginal cream INSERT PEA SIZED AMOUNT (0.5G) INTO VAGINA NIGHTLY EVERY OTHER NIGHT FOR 2 WEEKS, THEN TWICE WEEKLY 02/05/17   [provider]  fluticasone (FLONASE) 50 MCG/ACT nasal spray Place into both nostrils daily.    [provider]  gabapentin (NEURONTIN) 300 MG capsule TK 1 C PO BID P 03/04/18   [provider]  hydrOXYzine (VISTARIL) 25 MG capsule TAKE 1  CAPSULE BY MOUTH 3 TIMES A DAY AS NEEDED FOR ANXIETY ONLY FOR SEVERE ANXIETY SYMPTOMS 12/26/18   Ursula Alert, MD  ibuprofen (ADVIL,MOTRIN) 800 MG tablet TK 1 T PO Q 8 H WF PRN P 05/06/18   [provider]  methocarbamol (ROBAXIN) 500 MG tablet Take 500 mg by mouth every 8 (eight) hours as needed for muscle spasms.    [provider]  mometasone (ELOCON) 0.1 %  lotion APP EXT AA BID FOR 12 DAYS FOR ITCHY EARS. MAY REPEAT PRF ITCHY EARS 11/27/18   [provider]  Multiple Vitamin (MULTI-VITAMINS) TABS Take by mouth.    [provider]  nitrofurantoin (MACRODANTIN) 50 MG capsule Take 50 mg by mouth as needed. UTI dose should be 100 mg bid x 5 days UTI  or 50-100 mg qhs prevention    [provider]  oxyCODONE-acetaminophen (PERCOCET) 7.5-325 MG tablet Take 1 tablet by mouth every 6 (six) hours as needed for severe pain. Dr. Alvira Monday pain clinic [facility]    [provider]  phentermine 37.5 MG capsule TK 1 C PO QD 11/27/18   [provider]  traZODone (DESYREL) 100 MG tablet Take 0.5-1 tablets (50-100 mg total) by mouth at bedtime as needed for sleep. 12/12/18   Ursula Alert, MD  valACYclovir (VALTREX) 1000 MG tablet Oral blister 07/04/18   [provider]  VIMOVO 500-20 MG TBEC Take 1 tablet by mouth 2 (two) times daily. 05/02/18   [provider]   Allergies  Allergen Reactions  . Amoxicillin Hives  . Cephalexin     rash  . Septra [Sulfamethoxazole-Trimethoprim]     hives    FAMILY HISTORY:  family history includes Breast cancer in her maternal aunt and paternal aunt; COPD in her mother; Dementia in her father. SOCIAL HISTORY:  reports that she has never smoked. She has never used smokeless tobacco. She reports previous alcohol use. She reports that she does not use drugs.    Review of Systems:  Gen:  Denies  fever, sweats, chills weigh loss  HEENT: Denies blurred vision, double vision, ear pain, eye pain, hearing loss, nose bleeds, sore throat Cardiac:  No dizziness, chest pain or heaviness, chest tightness,edema, No JVD Resp:  +cough, -sputum production, +shortness of breath,-wheezing, -hemoptysis,  Gi: Denies swallowing difficulty, stomach pain, nausea or vomiting, diarrhea, constipation, bowel incontinence Gu:  Denies bladder incontinence, burning urine Ext:   Denies Joint  pain, stiffness or swelling Skin: Denies  skin rash, easy bruising or bleeding or hives Endoc:  Denies polyuria, polydipsia , polyphagia or weight change Psych:   Denies depression, insomnia or hallucinations  Other:  All other systems negative     BP 124/82 (BP Location: Left Arm, Cuff Size: Normal)   Pulse 86   Temp 97.6 F (36.4 C) (Skin)   Ht 5\' 7"  (1.702 m)   Wt 220 lb 12.8 oz (100.2 kg)   SpO2 98%   BMI 34.58 kg/m    Physical Examination:   GENERAL:NAD, no fevers, chills, no weakness no fatigue HEAD: Normocephalic, atraumatic.  EYES: PERLA, EOMI No scleral icterus.  NECK: Supple. No thyromegaly.  No JVD.  PULMONARY: CTA B/L no wheezing, rhonchi, crackles CARDIOVASCULAR: S1 and S2. Regular rate and rhythm. No murmurs GASTROINTESTINAL: Soft, nontender, nondistended. Positive bowel sounds.  MUSCULOSKELETAL: No swelling, clubbing, or edema.  NEUROLOGIC: No gross focal neurological deficits. 5/5 strength all extremities SKIN: No ulceration, lesions, rashes, or cyanosis.  PSYCHIATRIC: Insight, judgment intact. -depression -anxiety ALL OTHER  ROS ARE NEGATIVE   MEDICATIONS: I have reviewed all medications and confirmed regimen as documented    CULTURE RESULTS   Recent Results (from the past 240 hour(s))  Urine Culture     Status: None   Collection Time: 01/05/19 11:25 AM   Specimen: Urine  Result Value Ref Range Status   MICRO NUMBER: [id number]  Final   SPECIMEN QUALITY: Adequate  Final   Sample Source URINE  Final   STATUS: FINAL  Final   Result: No Growth  Final            ASSESSMENT AND PLAN SYNOPSIS  53 year old morbidly obese white female with moderate persistent asthma in the setting of allergic rhinitis with deconditioned state  Moderate persistent asthma We will start Symbicort therapy Patient advised to rinse mouth after every use Albuterol 2 to 4 puffs every 4 hours as needed Avoid allergens Obtain pulmonary function testing We will not  obtain methacholine challenge test as her clinical signs and symptoms are consistent with asthma Obtain 6-minute walk test   Allergic rhinitis Continue over-the-counter decongestion as she takes Astelin nasal sprays Follow-up ENT  Obesity -recommend significant weight loss -recommend changing diet  Deconditioned state -Recommend increased daily activity and exercise    COVID-19 EDUCATION: The signs and symptoms of COVID-19 were discussed with the patient and how to seek care for testing.  The importance of social distancing was discussed today. Hand Washing Techniques and avoid touching face was advised.  MEDICATION ADJUSTMENTS/LABS AND TESTS ORDERED:  Start Symbicort inhaler Albuterol as needed Obtain pulmonary function testing and 6-minute walk test Have advised patient to obtain chest x-ray report or images from radiological center  [place]   Patient satisfied with Plan of action and management. All questions answered  Follow up in 4 weeks   Trenna Kiely Patricia Pesa, M.D.  [facility]  Medical Director [place] Director [facility] Cardio-Pulmonary Department

## 2019-01-15 NOTE — Patient Instructions (Addendum)
START SYMBICORT INHALERS-RINSE MOUTH OUT AFTER EVERY USE START ALBUTEROL 2-4 PUFFS EVERY 4 HRS AS NEEDED FOR SOB   Asthma, Adult  Asthma is a long-term (chronic) condition in which the airways get tight and narrow. The airways are the breathing passages that lead from the nose and mouth down into the lungs. A person with asthma will have times when symptoms get worse. These are called asthma attacks. They can cause coughing, whistling sounds when you breathe (wheezing), shortness of breath, and chest pain. They can make it hard to breathe. There is no cure for asthma, but medicines and lifestyle changes can help control it. There are many things that can bring on an asthma attack or make asthma symptoms worse (triggers). Common triggers include:  Mold.  Dust.  Cigarette smoke.  Cockroaches.  Things that can cause allergy symptoms (allergens). These include animal skin flakes (dander) and pollen from trees or grass.  Things that pollute the air. These may include household cleaners, wood smoke, smog, or chemical odors.  Cold air, weather changes, and wind.  Crying or laughing hard.  Stress.  Certain medicines or drugs.  Certain foods such as dried fruit, potato chips, and grape juice.  Infections, such as a cold or the flu.  Certain medical conditions or diseases.  Exercise or tiring activities. Asthma may be treated with medicines and by staying away from the things that cause asthma attacks. Types of medicines may include:  Controller medicines. These help prevent asthma symptoms. They are usually taken every day.  Fast-acting reliever or rescue medicines. These quickly relieve asthma symptoms. They are used as needed and provide short-term relief.  Allergy medicines if your attacks are brought on by allergens.  Medicines to help control the body's defense (immune) system. Follow these instructions at home: Avoiding triggers in your home  Change your heating and air  conditioning filter often.  Limit your use of fireplaces and wood stoves.  Get rid of pests (such as roaches and mice) and their droppings.  Throw away plants if you see mold on them.  Clean your floors. Dust regularly. Use cleaning products that do not smell.  Have someone vacuum when you are not home. Use a vacuum cleaner with a HEPA filter if possible.  Replace carpet with wood, tile, or vinyl flooring. Carpet can trap animal skin flakes and dust.  Use allergy-proof pillows, mattress covers, and box spring covers.  Wash bed sheets and blankets every week in hot water. Dry them in a dryer.  Keep your bedroom free of any triggers.  Avoid pets and keep windows closed when things that cause allergy symptoms are in the air.  Use blankets that are made of polyester or cotton.  Clean bathrooms and kitchens with bleach. If possible, have someone repaint the walls in these rooms with mold-resistant paint. Keep out of the rooms that are being cleaned and painted.  Wash your hands often with soap and water. If soap and water are not available, use hand sanitizer.  Do not allow anyone to smoke in your home. General instructions  Take over-the-counter and prescription medicines only as told by your doctor. ? Talk with your doctor if you have questions about how or when to take your medicines. ? Make note if you need to use your medicines more often than usual.  Do not use any products that contain nicotine or tobacco, such as cigarettes and e-cigarettes. If you need help quitting, ask your doctor.  Stay away from secondhand smoke.  Avoid doing things outdoors when allergen counts are high and when air quality is low.  Wear a ski mask when doing outdoor activities in the winter. The mask should cover your nose and mouth. Exercise indoors on cold days if you can.  Warm up before you exercise. Take time to cool down after exercise.  Use a peak flow meter as told by your doctor. A peak  flow meter is a tool that measures how well the lungs are working.  Keep track of the peak flow meter's readings. Write them down.  Follow your asthma action plan. This is a written plan for taking care of your asthma and treating your attacks.  Make sure you get all the shots (vaccines) that your doctor recommends. Ask your doctor about a flu shot and a pneumonia shot.  Keep all follow-up visits as told by your doctor. This is important. Contact a doctor if:  You have wheezing, shortness of breath, or a cough even while taking medicine to prevent attacks.  The mucus you cough up (sputum) is thicker than usual.  The mucus you cough up changes from clear or white to yellow, green, gray, or bloody.  You have problems from the medicine you are taking, such as: ? A rash. ? Itching. ? Swelling. ? Trouble breathing.  You need reliever medicines more than 2-3 times a week.  Your peak flow reading is still at 50-79% of your personal best after following the action plan for 1 hour.  You have a fever. Get help right away if:  You seem to be worse and are not responding to medicine during an asthma attack.  You are short of breath even at rest.  You get short of breath when doing very little activity.  You have trouble eating, drinking, or talking.  You have chest pain or tightness.  You have a fast heartbeat.  Your lips or fingernails start to turn blue.  You are light-headed or dizzy, or you faint.  Your peak flow is less than 50% of your personal best.  You feel too tired to breathe normally. Summary  Asthma is a long-term (chronic) condition in which the airways get tight and narrow. An asthma attack can make it hard to breathe.  Asthma cannot be cured, but medicines and lifestyle changes can help control it.  Make sure you understand how to avoid triggers and how and when to use your medicines. This information is not intended to replace advice given to you by your  health care provider. Make sure you discuss any questions you have with your health care provider. Document Released: 12/12/2007 Document Revised: 08/28/2018 Document Reviewed: 07/30/2016 Elsevier Patient Education  2020 [employer].

## 2019-01-16 ENCOUNTER — Telehealth: Payer: Self-pay | Admitting: Internal Medicine

## 2019-01-16 NOTE — Telephone Encounter (Signed)
Called and spoke to pt, who is requesting Rx for Symbicort and albuterol.  Symbicort is mentioned within 01/15/2019 office note, however dosage is not.   DK please advise on Symbicort dose? Thanks

## 2019-01-19 ENCOUNTER — Telehealth: Payer: Self-pay

## 2019-01-19 ENCOUNTER — Other Ambulatory Visit: Payer: Self-pay | Admitting: Internal Medicine

## 2019-01-19 ENCOUNTER — Telehealth: Payer: Self-pay | Admitting: *Deleted

## 2019-01-19 ENCOUNTER — Other Ambulatory Visit: Payer: Self-pay

## 2019-01-19 DIAGNOSIS — N3000 Acute cystitis without hematuria: Secondary | ICD-10-CM

## 2019-01-19 NOTE — Telephone Encounter (Signed)
Copied from [id number]. Topic: General - Other >> Jan 19, 2019  3:40 PM [name] A wrote: Reason for CRM: Patient called to say that she is having a UTI with the urinary frequency, and feeling of not emptying bladder asking for something called in to the pharmacy but has Sulphur allergies. Ph# [phone]  [id number] [name], [place] [phone] (Phone) [phone] (Fax)

## 2019-01-19 NOTE — Telephone Encounter (Signed)
Please place future orders for lab appt. ?Urine

## 2019-01-19 NOTE — Telephone Encounter (Signed)
Called and spoke to patient.  Pt c/o feeling as though she can't empty her bladder, unable to completely urinate (trinkle only), bladder fullness, frequent urination, and bladder spasms.  Denies having a fever and no blood in urine.  Pt says that she usually has UTI's and her former PCP had prescribed Macrodantin as a preventative med.  Pt said that symptoms started last Friday and she began taking macrodantin and uristat meds on Friday.  Pt said that symptoms have gotten worse and wanted to know if PCP would call in Rx to pharmacy.  Informed pt that PCP usually doesn't call in meds w/o appt to evaluate symptoms to determine best meds and also PCP likes for pt's to come into lab to give a urine specimen.  Pt said that she already has morning appts scheduled with other MDs on tomorrow.  Pt was scheduled a lab appt at 2:45 pm and a virtual visit with PCP @ 3:30 pm for tomorrow (01/20/19).  Pt said that she was concerned about labs being accurate since her urine test results have been skewed before due to the macrodantin and uristat meds.   Pt advised to go to UC or [facility] if symptoms worsen.  Pt informed that notes will be forwarded to PCP for review.

## 2019-01-20 ENCOUNTER — Other Ambulatory Visit: Payer: Self-pay

## 2019-01-20 ENCOUNTER — Inpatient Hospital Stay: Payer: 59

## 2019-01-20 ENCOUNTER — Other Ambulatory Visit: Payer: 59

## 2019-01-20 ENCOUNTER — Encounter: Payer: Self-pay | Admitting: Oncology

## 2019-01-20 ENCOUNTER — Inpatient Hospital Stay: Payer: 59 | Attending: Oncology | Admitting: Oncology

## 2019-01-20 ENCOUNTER — Ambulatory Visit
Admission: RE | Admit: 2019-01-20 | Discharge: 2019-01-20 | Disposition: A | Discharge status: Home / Self Care | Payer: 59 | Source: Ambulatory Visit | Attending: Internal Medicine | Admitting: Internal Medicine

## 2019-01-20 ENCOUNTER — Ambulatory Visit: Payer: 59 | Admitting: Internal Medicine

## 2019-01-20 VITALS — BP 110/80 | HR 80 | Temp 96.2°F | Ht 67.0 in | Wt 218.4 lb

## 2019-01-20 DIAGNOSIS — K219 Gastro-esophageal reflux disease without esophagitis: Secondary | ICD-10-CM | POA: Insufficient documentation

## 2019-01-20 DIAGNOSIS — R0602 Shortness of breath: Secondary | ICD-10-CM

## 2019-01-20 DIAGNOSIS — R790 Abnormal level of blood mineral: Secondary | ICD-10-CM | POA: Diagnosis not present

## 2019-01-20 DIAGNOSIS — D473 Essential (hemorrhagic) thrombocythemia: Secondary | ICD-10-CM | POA: Insufficient documentation

## 2019-01-20 DIAGNOSIS — I313 Pericardial effusion (noninflammatory): Secondary | ICD-10-CM | POA: Diagnosis not present

## 2019-01-20 DIAGNOSIS — M1990 Unspecified osteoarthritis, unspecified site: Secondary | ICD-10-CM | POA: Diagnosis not present

## 2019-01-20 DIAGNOSIS — R002 Palpitations: Secondary | ICD-10-CM | POA: Insufficient documentation

## 2019-01-20 DIAGNOSIS — D75839 Thrombocytosis, unspecified: Secondary | ICD-10-CM

## 2019-01-20 DIAGNOSIS — D696 Thrombocytopenia, unspecified: Secondary | ICD-10-CM

## 2019-01-20 LAB — RETIC PANEL
Immature Retic Fract: 4.7 % (ref 2.3–15.9)
RBC.: 4.04 MIL/uL (ref 3.87–5.11)
Retic Count, Absolute: 55.3 10*3/uL (ref 19.0–186.0)
Retic Ct Pct: 1.4 % (ref 0.4–3.1)
Reticulocyte Hemoglobin: 36.6 pg (ref >27.9)

## 2019-01-20 LAB — CBC WITH DIFFERENTIAL/PLATELET
Abs Immature Granulocytes: 0.01 10*3/uL (ref 0.00–0.07)
Basophils Absolute: 0 10*3/uL (ref 0.0–0.1)
Basophils Relative: 1 %
Eosinophils Absolute: 0.1 10*3/uL (ref 0.0–0.5)
Eosinophils Relative: 1 %
HCT: 39.6 % (ref 36.0–46.0)
Hemoglobin: 12.8 g/dL (ref 12.0–15.0)
Immature Granulocytes: 0 %
Lymphocytes Relative: 29 %
Lymphs Abs: 1.7 10*3/uL (ref 0.7–4.0)
MCH: 31.7 pg (ref 26.0–34.0)
MCHC: 32.3 g/dL (ref 30.0–36.0)
MCV: 98 fL (ref 80.0–100.0)
Monocytes Absolute: 0.6 10*3/uL (ref 0.1–1.0)
Monocytes Relative: 10 %
Neutro Abs: 3.4 10*3/uL (ref 1.7–7.7)
Neutrophils Relative %: 59 %
Platelets: 301 10*3/uL (ref 150–400)
RBC: 4.04 MIL/uL (ref 3.87–5.11)
RDW: 12.4 % (ref 11.5–15.5)
WBC: 5.8 10*3/uL (ref 4.0–10.5)
nRBC: 0 % (ref 0.0–0.2)

## 2019-01-20 LAB — ECHOCARDIOGRAM COMPLETE
Height: 67 in
Weight: 3495 oz

## 2019-01-20 LAB — TECHNOLOGIST SMEAR REVIEW: Plt Morphology: NORMAL

## 2019-01-20 NOTE — Telephone Encounter (Signed)
Patient checking the status of the RX for Albuterol and Symbicort.  Pharmacy is [facility] [address]. Patient phone number is [phone].

## 2019-01-20 NOTE — Telephone Encounter (Signed)
DK please advise on symbicort dosage. Thanks

## 2019-01-20 NOTE — Progress Notes (Signed)
Patient here today as a new consult for anemia workup.  Patient c/o fatigue, SOB and easy bruising.

## 2019-01-20 NOTE — Telephone Encounter (Signed)
symbicort 160 dose

## 2019-01-20 NOTE — Progress Notes (Signed)
Hematology/Oncology Consult note [facility] Telephone:([phone] [phone]   Patient Care Team: McLean-Scocuzza, Nino Glow, MD as PCP - General (Internal Medicine)  REFERRING PROVIDER: McLean-Scocuzza, Olivia Mackie *  CHIEF COMPLAINTS/REASON FOR VISIT:  Evaluation of thrombocytosis  HISTORY OF PRESENTING ILLNESS:  Maureen Randall is a 53 y.o. female who was seen in consultation at the request of McLean-Scocuzza, Olivia Mackie * for evaluation of thromcbocytosis Reviewed patient's labs.  01/05/2019 labs showed elevated platelet counts at   450,000 wbc 7.5 and hemoglobin 13.7.  Reviewed patient's previous labs. Thrombocytosis onset is acute onset.  Patient has labs done at outside facility [facility] healthcare [facility].  Labs are reviewed via care everywhere. 05/21/2018, hemoglobin 11, hematocrit 32.9, platelet count 304, leukocytosis with a white count of 18.8, absolute neutrophils 16.2, absolute monocyte 1.5 05/15/2018, hemoglobin 13.5, platelet 412, white count 9.  Normal differential. 01/02/2018, hemoglobin 12, platelet 600, neutrophil was 17.2, WBC 10.4, absolute monocyte 1.4.  She also had history of arthritis.  Recently had CT lumbar spine without contrast done at [facility] on 01/13/2019 Sequelae of bilateral laminectomies with posterior instrumented fusion at L4-L5. The left-sided L4 pedicle screw tip is located outside of the vertebral body. No other evidence for hardware complication or failure. Osseous fusion of the right L4 and L5 articular processes. 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   07/30/2018 screening bilateral mammogram shows no mammographic evidence of malignancy.  No aggravating or elevated factors. Associated symptoms or signs:  Denies weight loss, fever, chills, fatigue, night sweats.  Context:  Smoking history: Denies any smoking history. Family history of polycythemia.  Denies History of iron deficiency anemia; denies History of DVT; remote provoked  DVT of right lower extremity after right total hip replacement.  On aspirin 81 mg and compression therapy. Reports that she takes "injections "for about a few weeks. 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 Patient follows up with vascular surgery Dr. Bayard Males for vein insufficiency.   Review of Systems  Constitutional: Negative for appetite change, chills, fatigue and fever.  HENT:   Negative for hearing loss and voice change.   Eyes: Negative for eye problems.  Respiratory: Negative for chest tightness and cough.   Cardiovascular: Negative for chest pain.  Gastrointestinal: Negative for abdominal distention, abdominal pain and blood in stool.  Endocrine: Negative for hot flashes.  Genitourinary: Negative for difficulty urinating and frequency.   Musculoskeletal: Negative for arthralgias.  Skin: Negative for itching and rash.  Neurological: Negative for extremity weakness.  Hematological: Negative for adenopathy.  Psychiatric/Behavioral: Negative for confusion.    MEDICAL HISTORY:  Past Medical History:  Diagnosis Date  . Anxiety   . Arthritis    osteoarthritis  . GERD (gastroesophageal reflux disease)     SURGICAL HISTORY: Past Surgical History:  Procedure Laterality Date  . ABLATION     veins in lower legs  . BREAST BIOPSY Left 2011   neg  . CARPAL TUNNEL RELEASE Right 12/2015   [facility]  . CARPAL TUNNEL RELEASE Left 12/2016  . FOOT SURGERY Right 2015   broken navicular bone  . HIP ARTHROSCOPY Right 05/2011  . HIP SURGERY     left hip surgery 05/2018  . REPLACEMENT TOTAL HIP W/  RESURFACING IMPLANTS Right 12/2017   [place] Right 12/2015   [facility] torn rotator cuff  . SPINAL FUSION  2016  [facility] lumbar    SOCIAL HISTORY: Social History   Socioeconomic History  . Marital status: Married     Spouse name: fredrick  . Number of children: 1  . Years of education: Not on file  . Highest education level: High school graduate  Occupational History    Comment: full time  Social Needs  . Financial resource strain: Somewhat hard  . Food insecurity    Worry: Sometimes true    Inability: Sometimes true  . Transportation needs    Medical: No    Non-medical: No  Tobacco Use  . Smoking status: Never Smoker  . Smokeless tobacco: Never Used  Substance and Sexual Activity  . Alcohol use: Not Currently    Alcohol/week: 1.0 standard drinks    Types: 1 Cans of beer per week  . Drug use: Never  . Sexual activity: Yes  Lifestyle  . Physical activity    Days per week: 0 days    Minutes per session: 0 min  . Stress: Very much  Relationships  . Social Herbalist on phone: Not on file    Gets together: Not on file    Attends religious service: Never    Active member of club or organization: No    Attends meetings of clubs or organizations: Never    Relationship status: Married  . Intimate partner violence    Fear of current or ex partner: No    Emotionally abused: No    Physically abused: No    Forced sexual activity: No  Other Topics Concern  . 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 Breast cancer Maternal Aunt   . Breast cancer Paternal Aunt   . Dementia Paternal Aunt   . COPD Mother        quit smoking after 60 years  . Dementia Father     ALLERGIES:  is allergic to amoxicillin; cephalexin; and septra [sulfamethoxazole-trimethoprim].  MEDICATIONS:  Current Outpatient Medications  Medication Sig Dispense Refill  . albuterol (PROVENTIL HFA;VENTOLIN HFA) 108 (90 Base) MCG/ACT inhaler INL 2 PFS PO Q 4 TO 6 H PRF WHZ    . aspirin EC 81 MG tablet 1 twice a day with food for 4 weeks.  Do not start until eliquis completed     . azelastine (ASTELIN) 0.1 % nasal spray INSTILL 1 SPRAY IEN D FOR ALLERGIES    . calcium carbonate (OS-CAL - DOSED IN MG OF ELEMENTAL CALCIUM) 1250 (500 Ca) MG tablet Take by mouth.    . Cholecalciferol (VITAMIN D3) 25 MCG (1000 UT) CAPS Take by mouth.    . Cyanocobalamin (B-12 COMPLIANCE INJECTION IJ) Inject as directed every 3 (three) months.    . diclofenac sodium (VOLTAREN) 1 % GEL   11  . DULoxetine (CYMBALTA) 30 MG capsule Take 3 capsules (90 mg total) by mouth daily. 270 capsule 1  . Elastic Bandages & Supports (RELIEF KNEE) MISC Apply in the morning and remove at night.    . estradiol (ESTRACE) 0.1 MG/GM vaginal cream INSERT PEA SIZED AMOUNT (0.5G) INTO VAGINA NIGHTLY EVERY OTHER NIGHT FOR 2 WEEKS, THEN TWICE WEEKLY    . fluticasone (FLONASE) 50 MCG/ACT nasal spray Place into both nostrils daily.    [name] gabapentin (NEURONTIN) 300 MG capsule TK 1 C PO BID P    .  ibuprofen (ADVIL,MOTRIN) 800 MG tablet TK 1 T PO Q 8 H WF PRN P  2  . methocarbamol (ROBAXIN) 500 MG tablet Take 500 mg by mouth every 8 (eight) hours as needed for muscle spasms.    . mometasone (ELOCON) 0.1 % lotion APP EXT AA BID FOR 12 DAYS FOR ITCHY EARS. MAY REPEAT PRF ITCHY EARS    . Multiple Vitamin (MULTI-VITAMINS) TABS Take by mouth.    . nitrofurantoin (MACRODANTIN) 50 MG capsule Take 50 mg by mouth as needed. UTI dose should be 100 mg bid x 5 days UTI  or 50-100 mg qhs prevention    . oxyCODONE-acetaminophen (PERCOCET) 7.5-325 MG tablet Take 1 tablet by mouth every 6 (six) hours as needed for severe pain. Dr. Alvira Monday pain clinic [facility]    . phentermine 37.5 MG capsule TK 1 C PO QD    . traZODone (DESYREL) 100 MG tablet Take 0.5-1 tablets (50-100 mg total) by mouth at bedtime as needed for sleep. 90 tablet 1  . valACYclovir (VALTREX) 1000 MG tablet Oral blister    . VIMOVO 500-20 MG TBEC Take 1 tablet by mouth 2 (two) times daily.  2   No current facility-administered medications for this visit.      PHYSICAL  EXAMINATION: ECOG PERFORMANCE STATUS: 0 - Asymptomatic Vitals:   01/20/19 0946  BP: 110/80  Pulse: 80  Temp: (!) 96.2 F (35.7 C)   Filed Weights   01/20/19 0946  Weight: 218 lb 7 oz (99.1 kg)    Physical Exam Constitutional:      General: She is not in acute distress. HENT:     Head: Normocephalic and atraumatic.  Eyes:     General: No scleral icterus.    Pupils: Pupils are equal, round, and reactive to light.  Neck:     Musculoskeletal: Normal range of motion and neck supple.  Cardiovascular:     Rate and Rhythm: Normal rate and regular rhythm.     Heart sounds: Normal heart sounds.  Pulmonary:     Effort: Pulmonary effort is normal. No respiratory distress.     Breath sounds: No wheezing.  Abdominal:     General: Bowel sounds are normal. There is no distension.     Palpations: Abdomen is soft. There is no mass.     Tenderness: There is no abdominal tenderness.  Musculoskeletal: Normal range of motion.        General: No deformity.  Skin:    General: Skin is warm and dry.     Findings: No erythema or rash.  Neurological:     Mental Status: She is alert and oriented to person, place, and time.     Cranial Nerves: No cranial nerve deficit.     Coordination: Coordination normal.  Psychiatric:        Behavior: Behavior normal.        Thought Content: Thought content normal.      LABORATORY DATA:  I have reviewed the data as listed Lab Results  Component Value Date   WBC 5.8 01/20/2019   HGB 12.8 01/20/2019   HCT 39.6 01/20/2019   MCV 98.0 01/20/2019   PLT 301 01/20/2019   Recent Labs    01/05/19 1125  NA 139  K 4.4  CL 100  CO2 28  GLUCOSE 96  BUN 30*  CREATININE 0.83  CALCIUM 9.8  PROT 7.5  ALBUMIN 4.5  AST 15  ALT 24  ALKPHOS 67  BILITOT 0.4   Iron/TIBC/Ferritin/ %Sat  Component Value Date/Time   IRON 53 01/05/2019 1125   FERRITIN 36.4 01/05/2019 1125   IRONPCTSAT 12.2 (L) 01/05/2019 1125      ASSESSMENT & PLAN:  1.  Thrombocytosis ([place])   2. Abnormal iron saturation    I discussed with patient that the differential diagnosis of the thrombosis is broad, including benign etiology such as reactive to surgery, trauma, infection, nutrition deficiency, etc, as well as malignant etiology including underlying bone marrow disorders.   For the work up of patient's thrombocytosis, I suggest to repeat CBC to check if thrombocytosis is persistent. She has had iron panel done,  Borderline low iron saturation at 12.2, ferritin 36.4.  Check retic panel.  Check smear.   Orders Placed This Encounter  Procedures  . CBC with Differential/Platelet    Standing Status:   Future    Number of Occurrences:   1    Standing Expiration Date:   01/20/2020  . Technologist smear review    Standing Status:   Future    Number of Occurrences:   1    Standing Expiration Date:   01/20/2020  . Retic Panel    Standing Status:   Future    Number of Occurrences:   1    Standing Expiration Date:   01/20/2020    All questions were answered. The patient knows to call the clinic with any problems questions or concerns.  Cc McLean-Scocuzza, Olivia Mackie *  Return of visit: 4 weeks.  Thank you for this kind referral and the opportunity to participate in the care of this patient. A copy of today's note is routed to referring provider  Total face to face encounter time for this patient visit was 45 min. >50% of the time was  spent in counseling and coordination of care.    Earlie Server, MD, PhD 01/20/2019

## 2019-01-20 NOTE — Progress Notes (Signed)
*  PRELIMINARY RESULTS* Echocardiogram 2D Echocardiogram has been performed.  Maureen Randall 01/20/2019, 11:47 AM

## 2019-01-21 MED ORDER — ALBUTEROL SULFATE HFA 108 (90 BASE) MCG/ACT IN AERS: 2.0000 | INHALATION_SPRAY | Freq: Four times a day (QID) | RESPIRATORY_TRACT | 2 refills | Status: DC | PRN

## 2019-01-21 MED ORDER — BUDESONIDE-FORMOTEROL FUMARATE 160-4.5 MCG/ACT IN AERO: INHALATION_SPRAY | RESPIRATORY_TRACT | 5 refills | Status: DC

## 2019-01-21 NOTE — Telephone Encounter (Signed)
Pt is aware of below message and voiced her understanding.  Rx for Symbicort 160 and albuterol has been sent to preferred pharmacy. Nothing further is needed.

## 2019-01-29 ENCOUNTER — Other Ambulatory Visit: Payer: Self-pay

## 2019-01-29 ENCOUNTER — Ambulatory Visit (INDEPENDENT_AMBULATORY_CARE_PROVIDER_SITE_OTHER): Payer: 59

## 2019-01-29 DIAGNOSIS — R0609 Other forms of dyspnea: Secondary | ICD-10-CM | POA: Diagnosis not present

## 2019-01-29 NOTE — Progress Notes (Signed)
SIX MIN WALK 01/29/2019  Medications no meds taken  Supplimental Oxygen during Test? (L/min) No  Laps 17  Partial Lap (in Meters) 0  Baseline BP (sitting) 122/78  Baseline Heartrate 72  Baseline Dyspnea (Borg Scale) 1  Baseline Fatigue (Borg Scale) 0  Baseline SPO2 97  BP (sitting) 134/86  Heartrate 92  Dyspnea (Borg Scale) 3  Fatigue (Borg Scale) 0.5  SPO2 96  BP (sitting) 130/80  Heartrate 84  SPO2 97  Distance Completed 578  Tech Comments: pt completed test at moderate to fast pace with c/o sob.  pt reported of back and hip pain, but stated this is normal with exertion.

## 2019-01-30 ENCOUNTER — Other Ambulatory Visit: Payer: Self-pay

## 2019-02-03 ENCOUNTER — Telehealth: Payer: Self-pay | Admitting: Internal Medicine

## 2019-02-03 NOTE — Telephone Encounter (Signed)
Pt is aware of date/time of covid test. Nothing further is needed.  

## 2019-02-06 ENCOUNTER — Other Ambulatory Visit: Payer: Self-pay

## 2019-02-06 ENCOUNTER — Other Ambulatory Visit
Admission: RE | Admit: 2019-02-06 | Discharge: 2019-02-06 | Disposition: A | Discharge status: Home / Self Care | Payer: 59 | Source: Ambulatory Visit | Attending: Internal Medicine | Admitting: Internal Medicine

## 2019-02-06 DIAGNOSIS — Z20828 Contact with and (suspected) exposure to other viral communicable diseases: Secondary | ICD-10-CM | POA: Diagnosis not present

## 2019-02-06 DIAGNOSIS — Z01812 Encounter for preprocedural laboratory examination: Secondary | ICD-10-CM | POA: Insufficient documentation

## 2019-02-07 LAB — SARS CORONAVIRUS 2 (TAT 6-24 HRS): SARS Coronavirus 2: NEGATIVE

## 2019-02-09 ENCOUNTER — Other Ambulatory Visit: Payer: Self-pay

## 2019-02-09 ENCOUNTER — Ambulatory Visit (INDEPENDENT_AMBULATORY_CARE_PROVIDER_SITE_OTHER): Payer: 59 | Admitting: Licensed Clinical Social Worker

## 2019-02-09 ENCOUNTER — Encounter: Payer: Self-pay | Admitting: Licensed Clinical Social Worker

## 2019-02-09 DIAGNOSIS — F411 Generalized anxiety disorder: Secondary | ICD-10-CM | POA: Diagnosis not present

## 2019-02-09 NOTE — Progress Notes (Signed)
Virtual Visit via Video Note  I connected with Maureen Randall on 02/09/19 at 10:00 AM EDT by a video enabled telemedicine application and verified that I am speaking with the correct person using two identifiers.   I discussed the limitations of evaluation and management by telemedicine and the availability of in person appointments. The patient expressed understanding and agreed to proceed.  I discussed the assessment and treatment plan with the patient. The patient was provided an opportunity to ask questions and all were answered. The patient agreed with the plan and demonstrated an understanding of the instructions.   The patient was advised to call back or seek an in-person evaluation if the symptoms worsen or if the condition fails to improve as anticipated.  I provided 45 minutes of non-face-to-face time during this encounter.   Alden Hipp, LCSW    THERAPIST PROGRESS NOTE  Session Time: 1000  Participation Level: Active  Behavioral Response: NeatAlertAnxious  Type of Therapy: Individual Therapy  Treatment Goals addressed: Anxiety  Interventions: CBT  Summary: Maureen Randall is a 53 y.o. female who presents with continued symptoms related to her diagnosis. Maureen Randall reports doing well since our last session, but noted she has felt overwhelmed by her medical problems. She reports going to a new doctor who has an aggressive plan to help improve her back pain, and part of that plan was for [facility] to lose weight. Maureen Randall reported feeling overwhelmed by the way the doctor presented the goal of weight loss. Because of this, we discussed ways to set more realistic, attainable goals that feel more manageable all while working towards the primary goal of losing weight. Maureen Randall was in agreement with this plan, and expressed understanding. Maureen Randall reported feeling overwhelmed by a lot of medical problems, and recently being given the diagnosis of asthma. LCSW encouraged Maureen Randall to shift her thinking  on the subject, and instead think of it as a positive thing that she now knows what is going on and has a way to address it. Maureen Randall expressed agreement and stated she will try to shift thoughts in various areas of her life. LCSW validated Maureen Randall's efforts so far, and encouraged her to allow herself a down day every now and then to do what her body is telling her it needs. Maureen Randall expressed agreement.   Suicidal/Homicidal: No  Therapist Response: Maureen Randall continues to work towards her tx goals but has not yet reached them. We will continue to work on emotional regulation skills and managing anxiety symptoms via CBT moving forward.   Plan: Return again in 4 weeks.  Diagnosis: Axis I: Generalized Anxiety Disorder    Axis II: No diagnosis    Alden Hipp, LCSW 02/09/2019

## 2019-02-11 ENCOUNTER — Other Ambulatory Visit: Payer: Self-pay

## 2019-02-11 ENCOUNTER — Ambulatory Visit: Payer: 59 | Attending: Internal Medicine

## 2019-02-11 DIAGNOSIS — R0602 Shortness of breath: Secondary | ICD-10-CM | POA: Insufficient documentation

## 2019-02-11 MED ORDER — ALBUTEROL SULFATE (2.5 MG/3ML) 0.083% IN NEBU
2.5000 mg | INHALATION_SOLUTION | Freq: Once | RESPIRATORY_TRACT | Status: AC
  Administered 2019-02-11: 09:00:00 2.5 mg via RESPIRATORY_TRACT
  Filled 2019-02-11: qty 3

## 2019-02-18 ENCOUNTER — Ambulatory Visit: Payer: 59 | Admitting: Oncology

## 2019-02-18 ENCOUNTER — Other Ambulatory Visit: Payer: Self-pay

## 2019-02-18 ENCOUNTER — Encounter: Payer: Self-pay | Admitting: Psychiatry

## 2019-02-18 ENCOUNTER — Ambulatory Visit (INDEPENDENT_AMBULATORY_CARE_PROVIDER_SITE_OTHER): Payer: 59 | Admitting: Psychiatry

## 2019-02-18 DIAGNOSIS — F411 Generalized anxiety disorder: Secondary | ICD-10-CM | POA: Insufficient documentation

## 2019-02-18 DIAGNOSIS — G4701 Insomnia due to medical condition: Secondary | ICD-10-CM | POA: Insufficient documentation

## 2019-02-18 MED ORDER — DULOXETINE HCL 60 MG PO CPEP: 60.0000 mg | ORAL_CAPSULE | Freq: Every day | ORAL | 1 refills | Status: DC

## 2019-02-18 MED ORDER — TRAZODONE HCL 100 MG PO TABS: 100.0000 mg | ORAL_TABLET | Freq: Every evening | ORAL | 1 refills | Status: DC | PRN

## 2019-02-18 NOTE — Progress Notes (Signed)
Virtual Visit via Video Note  I connected with [name] on 02/18/19 at 10:00 AM EDT by a video enabled telemedicine application and verified that I am speaking with the correct person using two identifiers.   I discussed the limitations of evaluation and management by telemedicine and the availability of in person appointments. The patient expressed understanding and agreed to proceed.     I discussed the assessment and treatment plan with the patient. The patient was provided an opportunity to ask questions and all were answered. The patient agreed with the plan and demonstrated an understanding of the instructions.   The patient was advised to call back or seek an in-person evaluation if the symptoms worsen or if the condition fails to improve as anticipated.   Manderson MD OP Progress Note  02/18/2019 11:30 AM VALERIE CONES  MRN:  [id number]  Chief Complaint:  Chief Complaint    Follow-up     HPI: Maureen Randall is a 53 year old Caucasian female, has a history of GAD, insomnia, chronic pain, primary osteoarthritis, lumbar radiculopathy, degenerative spondylolisthesis, bilateral carpal tunnel syndrome, recent hip surgery was evaluated by telemedicine today.  She reports she is currently trying to get treatment for her pain.  Her pain providers have advised to get an ablation for her hip joint.  She is hoping that will help her with her pain.  She reports her opioid pain medication was recently increased to 4 times a day to address her pain.  She reports mood wise she has been doing okay on the current dosage of Cymbalta.  She however reports she has noticed some problems with her thinking recently.  She has been struggling with some short-term memory problems.  She reports she would watch a TV show in the morning and later that day she would completely forget about it.  This has been going on since the past few weeks.  She is worried since dementia runs in her family.  She is also worried  whether the medications are causing it.  She is worried about the Cymbalta.  She denies any suicidality, homicidality or perceptual disturbances.  Patient reports sleep continues to be restless.  Trazodone did help in the beginning however it does not help much time.  She denies any other concerns today. Visit Diagnosis:    ICD-10-CM   1. GAD (generalized anxiety disorder)  F41.1 DULoxetine (CYMBALTA) 60 MG capsule    traZODone (DESYREL) 100 MG tablet  2. Insomnia due to medical condition  G47.01 DULoxetine (CYMBALTA) 60 MG capsule    traZODone (DESYREL) 100 MG tablet    Past Psychiatric History: I have reviewed past psychiatric history from my progress note on 04/30/2018.  Past trials of Xanax, diazepam, clonazepam, Pamelor  Past Medical History:  Past Medical History:  Diagnosis Date  . Anxiety   . Arthritis    osteoarthritis  . GERD (gastroesophageal reflux disease)     Past Surgical History:  Procedure Laterality Date  . ABLATION     veins in lower legs  . BREAST BIOPSY Left 2011   neg  . CARPAL TUNNEL RELEASE Right 12/2015   [facility]  . CARPAL TUNNEL RELEASE Left 12/2016  . FOOT SURGERY Right 2015   broken navicular bone  . HIP ARTHROSCOPY Right 05/2011  . HIP SURGERY     left hip surgery 05/2018  . REPLACEMENT TOTAL HIP W/  RESURFACING IMPLANTS Right 12/2017   [facility] Right 12/2015   [facility] torn  rotator cuff  . SPINAL FUSION  2016   [facility] lumbar    Family Psychiatric History: I have reviewed family psychiatric history from my progress note on 04/30/2018.  Family History:  Family History  Problem Relation Age of Onset  . Breast cancer Maternal Aunt   . Breast cancer Paternal Aunt   . Dementia Paternal Aunt   . COPD Mother        quit smoking after 60 years  . Dementia Father     Social History: I have reviewed social history from my progress note on 04/30/2018. Social History   Socioeconomic History  .  Marital status: Married    Spouse name: fredrick  . Number of children: 1  . Years of education: Not on file  . Highest education level: High school graduate  Occupational History    Comment: full time  Social Needs  . Financial resource strain: Somewhat hard  . Food insecurity    Worry: Sometimes true    Inability: Sometimes true  . Transportation needs    Medical: No    Non-medical: No  Tobacco Use  . Smoking status: Never Smoker  . Smokeless tobacco: Never Used  Substance and Sexual Activity  . Alcohol use: Not Currently    Alcohol/week: 1.0 standard drinks    Types: 1 Cans of beer per week  . Drug use: Never  . Sexual activity: Yes  Lifestyle  . Physical activity    Days per week: 0 days    Minutes per session: 0 min  . Stress: Very much  Relationships  . Social Herbalist on phone: Not on file    Gets together: Not on file    Attends religious service: Never    Active member of club or organization: No    Attends meetings of clubs or organizations: Never    Relationship status: Married  Other Topics Concern  . 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 Amoxicillin Hives  . Cephalexin     rash  . Septra [Sulfamethoxazole-Trimethoprim]     hives    Metabolic Disorder Labs: Lab Results  Component Value Date   HGBA1C 5.7 01/05/2019   No results found for: PROLACTIN Lab Results  Component Value Date   CHOL 250 (H) 01/05/2019   TRIG 93.0 01/05/2019   HDL 75.90 01/05/2019   CHOLHDL 3 01/05/2019   VLDL 18.6 01/05/2019   LDLCALC 156 (H) 01/05/2019   Lab Results  Component Value Date   TSH 3.78 01/05/2019    Therapeutic Level Labs: No results found for: LITHIUM No results found for: VALPROATE No components found for:  CBMZ  Current Medications: Current Outpatient Medications  Medication Sig Dispense Refill  . albuterol  (VENTOLIN HFA) 108 (90 Base) MCG/ACT inhaler Inhale 2 puffs into the lungs every 6 (six) hours as needed for wheezing or shortness of breath. 18 g 2  . aspirin EC 81 MG tablet 1 twice a day with food for 4 weeks.  Do not start until eliquis completed    . azelastine (ASTELIN) 0.1 % nasal spray INSTILL 1 SPRAY IEN D FOR ALLERGIES    . budesonide-formoterol (SYMBICORT) 160-4.5 MCG/ACT inhaler 2 puffs twice daily. 1 Inhaler 5  . calcium carbonate (OS-CAL - DOSED IN MG OF ELEMENTAL CALCIUM) 1250 (500 Ca) MG tablet Take by mouth.    [name]  Cholecalciferol (VITAMIN D3) 25 MCG (1000 UT) CAPS Take by mouth.    . Cyanocobalamin (B-12 COMPLIANCE INJECTION IJ) Inject as directed every 3 (three) months.    . diclofenac sodium (VOLTAREN) 1 % GEL   11  . DULoxetine (CYMBALTA) 60 MG capsule Take 1 capsule (60 mg total) by mouth daily. 90 capsule 1  . Elastic Bandages & Supports (RELIEF KNEE) MISC Apply in the morning and remove at night.    . estradiol (ESTRACE) 0.1 MG/GM vaginal cream INSERT PEA SIZED AMOUNT (0.5G) INTO VAGINA NIGHTLY EVERY OTHER NIGHT FOR 2 WEEKS, THEN TWICE WEEKLY    . fluticasone (FLONASE) 50 MCG/ACT nasal spray Place into both nostrils daily.    [name] ibuprofen (ADVIL,MOTRIN) 800 MG tablet TK 1 T PO Q 8 H WF PRN P  2  . methocarbamol (ROBAXIN) 500 MG tablet Take 500 mg by mouth every 8 (eight) hours as needed for muscle spasms.    . mometasone (ELOCON) 0.1 % lotion APP EXT AA BID FOR 12 DAYS FOR ITCHY EARS. MAY REPEAT PRF ITCHY EARS    . Multiple Vitamin (MULTI-VITAMINS) TABS Take by mouth.    . nitrofurantoin (MACRODANTIN) 50 MG capsule Take 50 mg by mouth as needed. UTI dose should be 100 mg bid x 5 days UTI  or 50-100 mg qhs prevention    . oxyCODONE-acetaminophen (PERCOCET) 7.5-325 MG tablet Take 1 tablet by mouth every 6 (six) hours as needed for severe pain. Dr. Alvira Monday pain clinic [facility]    . phentermine 37.5 MG capsule TK 1 C PO QD    . traZODone (DESYREL) 100 MG tablet Take 1-1.5  tablets (100-150 mg total) by mouth at bedtime as needed for sleep. 135 tablet 1  . valACYclovir (VALTREX) 1000 MG tablet Oral blister    . VIMOVO 500-20 MG TBEC Take 1 tablet by mouth 2 (two) times daily.  2   No current facility-administered medications for this visit.      Musculoskeletal: Strength & Muscle Tone: UTA Gait & Station: normal Patient leans: N/A  Psychiatric Specialty Exam: Review of Systems  Musculoskeletal: Positive for back pain and joint pain.  Psychiatric/Behavioral: The patient has insomnia.   All other systems reviewed and are negative.   There were no vitals taken for this visit.There is no height or weight on file to calculate BMI.  General Appearance: Casual  Eye Contact:  Fair  Speech:  Clear and Coherent  Volume:  Normal  Mood:  Anxious  Affect:  Appropriate  Thought Process:  Goal Directed and Descriptions of Associations: Intact  Orientation:  Full (Time, Place, and Person)  Thought Content: Logical   Suicidal Thoughts:  No  Homicidal Thoughts:  No  Memory:  Immediate;   Fair Recent;   Fair Remote;   Fair  Judgement:  Fair  Insight:  Fair  Psychomotor Activity:  Normal  Concentration:  Concentration: Fair and Attention Span: Fair  Recall:  [employer] of Knowledge: Fair  Language: Fair  Akathisia:  No  Handed:  Right  AIMS (if indicated): Denies tremors, rigidity  Assets:  Communication Skills Desire for Improvement Social Support  ADL's:  Intact  Cognition: WNL  Sleep:  Poor   Screenings: PHQ2-9     Clinical Support from 02/11/2018 in [facility] Visit from 01/29/2018 in [facility]  PHQ-2 Total Score  0  0       Assessment and Plan: Maureen Randall is  a 53 year old Caucasian female, married, employed, currently on long-term disability, lives in [place], has a history of GAD, insomnia, chronic pain, osteoarthritis, degenerative  spondylolisthesis, lumbar radiculopathy, bilateral carpal tunnel syndrome, multiple surgeries was evaluated by telemedicine today.  Patient continues to have psychosocial stressors of her health issues especially her pain.  She also currently struggles with some cognitive problems, short-term memory problems.  Discussed plan as noted below.  Plan For GAD- improving Since patient struggles with cognitive issues, will try reducing Cymbalta to 60 mg p.o. daily. Patient will monitor herself closely. Continue hydroxyzine 25 mg p.o. 3 times daily as needed for anxiety attacks-she rarely uses it. Continue CBT with Ms. Cecilie Lowers  For insomnia- unstable Increase trazodone to 100 to 150 mg p.o. nightly as needed  Discussed with patient to closely monitor her cognitive problems.  Discussed with her that medications like opioid pain medications, muscle relaxants as well as hydroxyzine, antidepressants could cause cognitive issues.  Her pain medications were recently increased which could be contributing to it.  We will try reducing Cymbalta as discussed above.  Also discussed with her that sleep problems could also cause concentration problems during the day.  We will try to address it by increasing her trazodone.  Follow-up in clinic in 4 weeks or sooner if needed.  September 29 at 8:30 AM  I have spent atleast 15 minutes non  face to face with patient today. More than 50 % of the time was spent for psychoeducation and supportive psychotherapy and care coordination.'  This note was generated in part or whole with voice recognition software. Voice recognition is usually quite accurate but there are transcription errors that can and very often do occur. I apologize for any typographical errors that were not detected and corrected.       Ursula Alert, MD 02/18/2019, 11:30 AM

## 2019-02-20 ENCOUNTER — Ambulatory Visit: Payer: 59 | Admitting: Internal Medicine

## 2019-02-25 ENCOUNTER — Ambulatory Visit (INDEPENDENT_AMBULATORY_CARE_PROVIDER_SITE_OTHER): Payer: 59 | Admitting: Internal Medicine

## 2019-02-25 ENCOUNTER — Encounter: Payer: Self-pay | Admitting: Internal Medicine

## 2019-02-25 DIAGNOSIS — J309 Allergic rhinitis, unspecified: Secondary | ICD-10-CM | POA: Diagnosis not present

## 2019-02-25 DIAGNOSIS — J4520 Mild intermittent asthma, uncomplicated: Secondary | ICD-10-CM | POA: Diagnosis not present

## 2019-02-25 NOTE — Patient Instructions (Signed)
Continue Symbicort Use albuterol as needed and before exercise AVOID triggers

## 2019-02-25 NOTE — Progress Notes (Signed)
Name: Maureen Randall MRN: [id number] DOB: [birth year]     I connected with the patient by telephone enabled telemedicine visit and verified that I am speaking with the correct person using two identifiers.    I discussed the limitations, risks, security and privacy concerns of performing an evaluation and management service by telemedicine and the availability of in-person appointments. I also discussed with the patient that there may be a patient responsible charge related to this service. The patient expressed understanding and agreed to proceed.  PATIENT AGREES AND CONFIRMS -YES   Other persons participating in the visit and their role in the encounter: Patient, nursing    I discussed the limitations, risks, security and privacy concerns of performing an evaluation and management service by telephone and the availability of in person appointments. I also discussed with the patient that there may be a patient responsible charge related to this service. The patient expressed understanding and agreed to proceed.  This visit type was conducted due to national recommendations for restrictions regarding the COVID-19 Pandemic (e.g. social distancing).  This format is felt to be most appropriate for this patient at this time.  All issues noted in this document were discussed and addressed.       CONSULTATION DATE: 02/25/2019 REFERRING MD : Aundra Dubin  CHIEF COMPLAINT: follow up ASTHMA  HISTORY OF PRESENT ILLNESS: Patient started on Symbicort inhaler helps a lot with her breathing No wheezing No cough  Albuterol used as needed  No signs of infection  No asthma exacerbation  Chronic allergic rhinitis Uses flonase as needed does help a little  astelin nasal sprays does hlep  PFT's results were explained to patient  No obvious evidence obstructive or restrictive lung disease        PAST MEDICAL HISTORY :   has a past medical history of Anxiety, Arthritis, and GERD (gastroesophageal  reflux disease).  has a past surgical history that includes Hip arthroscopy (Right, 05/2011); Foot surgery (Right, 2015); Spinal fusion (2016); Shoulder surgery (Right, 12/2015); Carpal tunnel release (Right, 12/2015); Carpal tunnel release (Left, 12/2016); Replacement total hip w/  resurfacing implants (Right, 12/2017); Breast biopsy (Left, 2011); Hip surgery; and Ablation. Prior to Admission medications   Medication Sig Start Date End Date Taking? Authorizing Provider  albuterol (PROVENTIL HFA;VENTOLIN HFA) 108 (90 Base) MCG/ACT inhaler INL 2 PFS PO Q 4 TO 6 H PRF WHZ 09/16/18   [provider]  aspirin EC 81 MG tablet 1 twice a day with food for 4 weeks.  Do not start until eliquis completed 12/21/17   [provider]  azelastine (ASTELIN) 0.1 % nasal spray INSTILL 1 SPRAY IEN D FOR ALLERGIES 10/22/18   [provider]  calcium carbonate (OS-CAL - DOSED IN MG OF ELEMENTAL CALCIUM) 1250 (500 Ca) MG tablet Take by mouth.    [provider]  Cholecalciferol (VITAMIN D3) 25 MCG (1000 UT) CAPS Take by mouth.    [provider]  Cyanocobalamin (B-12 COMPLIANCE INJECTION IJ) Inject as directed every 3 (three) months.    [provider]  diclofenac sodium (VOLTAREN) 1 % GEL  04/02/18   [provider]  DULoxetine (CYMBALTA) 30 MG capsule Take 3 capsules (90 mg total) by mouth daily. 11/11/18   Ursula Alert, MD  Elastic Bandages & Supports (RELIEF KNEE) MISC Apply in the morning and remove at night. 07/15/13   [provider]  estradiol (ESTRACE) 0.1 MG/GM vaginal cream INSERT PEA SIZED AMOUNT (0.5G) INTO VAGINA NIGHTLY EVERY OTHER  NIGHT FOR 2 WEEKS, THEN TWICE WEEKLY 02/05/17   [provider]  fluticasone (FLONASE) 50 MCG/ACT nasal spray Place into both nostrils daily.    [provider]  gabapentin (NEURONTIN) 300 MG capsule TK 1 C PO BID P 03/04/18   [provider]  hydrOXYzine (VISTARIL) 25 MG capsule TAKE 1  CAPSULE BY MOUTH 3 TIMES A DAY AS NEEDED FOR ANXIETY ONLY FOR SEVERE ANXIETY SYMPTOMS 12/26/18   Ursula Alert, MD  ibuprofen (ADVIL,MOTRIN) 800 MG tablet TK 1 T PO Q 8 H WF PRN P 05/06/18   [provider]  methocarbamol (ROBAXIN) 500 MG tablet Take 500 mg by mouth every 8 (eight) hours as needed for muscle spasms.    [provider]  mometasone (ELOCON) 0.1 % lotion APP EXT AA BID FOR 12 DAYS FOR ITCHY EARS. MAY REPEAT PRF ITCHY EARS 11/27/18   [provider]  Multiple Vitamin (MULTI-VITAMINS) TABS Take by mouth.    [provider]  nitrofurantoin (MACRODANTIN) 50 MG capsule Take 50 mg by mouth as needed. UTI dose should be 100 mg bid x 5 days UTI  or 50-100 mg qhs prevention    [provider]  oxyCODONE-acetaminophen (PERCOCET) 7.5-325 MG tablet Take 1 tablet by mouth every 6 (six) hours as needed for severe pain. Dr. Alvira Monday pain clinic [facility]    [provider]  phentermine 37.5 MG capsule TK 1 C PO QD 11/27/18   [provider]  traZODone (DESYREL) 100 MG tablet Take 0.5-1 tablets (50-100 mg total) by mouth at bedtime as needed for sleep. 12/12/18   Ursula Alert, MD  valACYclovir (VALTREX) 1000 MG tablet Oral blister 07/04/18   [provider]  VIMOVO 500-20 MG TBEC Take 1 tablet by mouth 2 (two) times daily. 05/02/18   [provider]   Allergies  Allergen Reactions  . Amoxicillin Hives  . Cephalexin     rash  . Septra [Sulfamethoxazole-Trimethoprim]     hives  SOCIAL HISTORY:  reports that she has never smoked. She has never used smokeless tobacco. She reports previous alcohol use of about 1.0 standard drinks of alcohol per week. She reports that she does not use drugs.     Review of Systems:  Gen:  Denies  fever, sweats, chills weight loss  HEENT: Denies blurred vision, double vision, ear pain, eye pain, hearing loss, nose bleeds, sore throat Cardiac:  No dizziness, chest pain or heaviness,  chest tightness,edema, No JVD Resp:   No cough, -sputum production, -shortness of breath,-wheezing, -hemoptysis,  Gi: Denies swallowing difficulty, stomach pain, nausea or vomiting, diarrhea, constipation, bowel incontinence Gu:  Denies bladder incontinence, burning urine Ext:   Denies Joint pain, stiffness or swelling Skin: Denies  skin rash, easy bruising or bleeding or hives Endoc:  Denies polyuria, polydipsia , polyphagia or weight change Psych:   Denies depression, insomnia or hallucinations  Other:  All other systems negative    Triggers include -perfumes, colognes, smoke Exercise, carpet, No pets   ASSESSMENT AND PLAN SYNOPSIS  53 year old morbidly obese white female with moderate persistent asthma in the setting of allergic rhinitis with deconditioned state    MILD  persistent asthma-well controlled at this time Continue Symbicort  as prescribed albuterol as needed AVOID triggers Take 2-4 puffs  prior to exercise and exertion  Chronic Allergic Rhinitis continue nasal sprays as prescribed   Obesity -recommend significant weight loss -recommend changing diet  Deconditioned state -Recommend increased daily activity and exercise  COVID-19 EDUCATION: The signs and symptoms of COVID-19 were discussed with the patient and how to seek care for testing.  The importance of social distancing was discussed today. Hand Washing Techniques and avoid touching face was advised.  MEDICATION ADJUSTMENTS/LABS AND TESTS ORDERED: Continue Symbicort Use albuterol as needed and before exercise AVOID triggers   CURRENT MEDICATIONS REVIEWED AT LENGTH WITH PATIENT TODAY   Patient satisfied with Plan of action and management. All questions answered  Follow up in 6 months   Total Time spent 23 mins  Carla Whilden Patricia Pesa, M.D.  [facility]  Medical Director [place] Director [facility] Cardio-Pulmonary Department

## 2019-02-26 DIAGNOSIS — M25512 Pain in left shoulder: Secondary | ICD-10-CM | POA: Insufficient documentation

## 2019-03-12 ENCOUNTER — Ambulatory Visit (INDEPENDENT_AMBULATORY_CARE_PROVIDER_SITE_OTHER): Payer: 59 | Admitting: Licensed Clinical Social Worker

## 2019-03-12 ENCOUNTER — Encounter: Payer: Self-pay | Admitting: Licensed Clinical Social Worker

## 2019-03-12 ENCOUNTER — Other Ambulatory Visit: Payer: Self-pay

## 2019-03-12 DIAGNOSIS — F411 Generalized anxiety disorder: Secondary | ICD-10-CM | POA: Diagnosis not present

## 2019-03-12 NOTE — Progress Notes (Signed)
  Virtual Visit via Video Note  I connected with Maureen Randall on 03/12/19 at 10:00 AM EDT by a video enabled telemedicine application and verified that I am speaking with the correct person using two identifiers.   I discussed the limitations of evaluation and management by telemedicine and the availability of in person appointments. The patient expressed understanding and agreed to proceed.    I discussed the assessment and treatment plan with the patient. The patient was provided an opportunity to ask questions and all were answered. The patient agreed with the plan and demonstrated an understanding of the instructions.   The patient was advised to call back or seek an in-person evaluation if the symptoms worsen or if the condition fails to improve as anticipated.  I provided 60 minutes of non-face-to-face time during this encounter.   Alden Hipp, Maureen Randall   THERAPIST PROGRESS NOTE  Session Time: 1000  Participation Level: Active  Behavioral Response: NeatAlertAnxious  Type of Therapy: Individual Therapy  Treatment Goals addressed: Coping  Interventions: CBT  Summary: Maureen Randall is a 53 y.o. female who presents with continued symptoms related to her diagnosis. Maureen Randall reports doing well since our last session. She reports ongoing physical health problems. "I had to have an MRI today of my shoulder, I think I have a tear in it." Maureen Randall reports feeling she has a tear due to having one previously in her other shoulder. Maureen Randall validated Maureen Randall's feelings around the subject, and held space for her to discuss her fears. She stated shoulder surgery was the most painful thing she went through, and the pain was nearly unbearable the previous time. We discussed ways to challenge negative thoughts, by adding evidence for the positive side of the argument. For instance, Maureen Randall will, in the long run, feel much better following the surgery. We discussed this at length and were able to shift Maureen Randall's  thinking on the subject. Maureen Randall instructed her to continue utilizing CBT skills to challenge negative thoughts. Maureen Randall moved on to discussing being out of work. She stated on most days it is easy to cope with while waiting on her disability to come through; however, on days she "zones out," and watches TV all day, she ultimately feels guilty. We discussed distraction as a coping mechanism, and how it is appropriate to utilize that as frequently as she needs, while still completing responsibilities. We also discussed making sure she has appropriate expectations for herself, given her limitations physically. Maureen Randall stated it was difficult to accept as she has always been very hard working. Maureen Randall validated that statement, and encouraged her to understand she can have the same drive without over doing it. Maureen Randall expressed understanding and agreement.   Suicidal/Homicidal: No  Therapist Response: Maureen Randall continues to work towards her tx goals but has not yet reached them. She continues to work on improving CBT skills to assist in managing anxiety in the moment. We will also continue to work on emotional regulation skills.   Plan: Return again in 4 weeks.  Diagnosis: Axis I: Generalized Anxiety Disorder    Axis II: No diagnosis    Alden Hipp, Maureen Randall 03/12/2019

## 2019-03-13 ENCOUNTER — Ambulatory Visit (INDEPENDENT_AMBULATORY_CARE_PROVIDER_SITE_OTHER): Payer: 59 | Admitting: Family Medicine

## 2019-03-13 DIAGNOSIS — J019 Acute sinusitis, unspecified: Secondary | ICD-10-CM | POA: Diagnosis not present

## 2019-03-13 MED ORDER — DOXYCYCLINE HYCLATE 100 MG PO TABS: 100.0000 mg | ORAL_TABLET | Freq: Two times a day (BID) | ORAL | 0 refills | Status: DC

## 2019-03-13 NOTE — Progress Notes (Signed)
Patient ID: Maureen Randall, female   DOB: Dec 30, 1965, 53 y.o.   MRN: [id number]    Virtual Visit via video Note  This visit type was conducted due to national recommendations for restrictions regarding the COVID-19 pandemic (e.g. social distancing).  This format is felt to be most appropriate for this patient at this time.  All issues noted in this document were discussed and addressed.  No physical exam was performed (except for noted visual exam findings with Video Visits).   I connected with Maureen Randall today at 10:40 AM EDT by a video enabled telemedicine application and verified that I am speaking with the correct person using two identifiers. Location patient: home Location provider: work or home office Persons participating in the virtual visit: patient, provider  I discussed the limitations, risks, security and privacy concerns of performing an evaluation and management service by video and the availability of in person appointments. I also discussed with the patient that there may be a patient responsible charge related to this service. The patient expressed understanding and agreed to proceed.   HPI:  Patient and I connected via video to discuss sinus congestion sinus pressure.  Patient states she has been dealing with this for almost 3 weeks.  She does see ENT who is helping her try to manage her sinus issues.  She has tried decongestants, saline nasal rinses and steam to help reduce sinus congestion pressure without walk.  Can feel the thick drainage down back of throat, was blowing out green nasal discharge, now feels thick and stuck in sinuses.  Denies shortness of breath or wheezing.  Denies fever or chills.  Denies nausea, vomiting or diarrhea.  Denies chest pain.  Denies GU complaints  ROS: See pertinent positives and negatives per HPI.  Past Medical History:  Diagnosis Date  . Anxiety   . Arthritis    osteoarthritis  . GERD (gastroesophageal reflux disease)     Past  Surgical History:  Procedure Laterality Date  . ABLATION     veins in lower legs  . BREAST BIOPSY Left 2011   neg  . CARPAL TUNNEL RELEASE Right 12/2015   [facility]  . CARPAL TUNNEL RELEASE Left 12/2016  . FOOT SURGERY Right 2015   broken navicular bone  . HIP ARTHROSCOPY Right 05/2011  . HIP SURGERY     left hip surgery 05/2018  . REPLACEMENT TOTAL HIP W/  RESURFACING IMPLANTS Right 12/2017   [place] Right 12/2015   [facility] torn rotator cuff  . SPINAL FUSION  2016   [facility] lumbar    Family History  Problem Relation Age of Onset  . Breast cancer Maternal Aunt   . Breast cancer Paternal Aunt   . Dementia Paternal Aunt   . COPD Mother        quit smoking after 60 years  . Dementia Father    Social History   Tobacco Use  . Smoking status: Never Smoker  . Smokeless tobacco: Never Used  Substance Use Topics  . Alcohol use: Not Currently    Alcohol/week: 1.0 standard drinks    Types: 1 Cans of beer per week      Current Outpatient Medications:  .  albuterol (VENTOLIN HFA) 108 (90 Base) MCG/ACT inhaler, Inhale 2 puffs into the lungs every 6 (six) hours as needed for wheezing or shortness of breath., Disp: 18 g, Rfl: 2 .  aspirin EC 81 MG tablet, 1 twice a day with food  for 4 weeks.  Do not start until eliquis completed, Disp: , Rfl:  .  azelastine (ASTELIN) 0.1 % nasal spray, INSTILL 1 SPRAY IEN D FOR ALLERGIES, Disp: , Rfl:  .  budesonide-formoterol (SYMBICORT) 160-4.5 MCG/ACT inhaler, 2 puffs twice daily., Disp: 1 Inhaler, Rfl: 5 .  diclofenac sodium (VOLTAREN) 1 % GEL, , Disp: , Rfl: 11 .  DULoxetine (CYMBALTA) 60 MG capsule, Take 1 capsule (60 mg total) by mouth daily., Disp: 90 capsule, Rfl: 1 .  Elastic Bandages & Supports (RELIEF KNEE) MISC, Apply in the morning and remove at night., Disp: , Rfl:  .  estradiol (ESTRACE) 0.1 MG/GM vaginal cream, INSERT PEA SIZED AMOUNT (0.5G) INTO VAGINA NIGHTLY EVERY OTHER NIGHT FOR 2 WEEKS,  THEN TWICE WEEKLY, Disp: , Rfl:  .  fluticasone (FLONASE) 50 MCG/ACT nasal spray, Place into both nostrils daily., Disp: , Rfl:  .  ibuprofen (ADVIL,MOTRIN) 800 MG tablet, TK 1 T PO Q 8 H WF PRN P, Disp: , Rfl: 2 .  methocarbamol (ROBAXIN) 500 MG tablet, Take 500 mg by mouth every 8 (eight) hours as needed for muscle spasms., Disp: , Rfl:  .  mometasone (ELOCON) 0.1 % lotion, APP EXT AA BID FOR 12 DAYS FOR ITCHY EARS. MAY REPEAT PRF ITCHY EARS, Disp: , Rfl:  .  Multiple Vitamin (MULTI-VITAMINS) TABS, Take by mouth., Disp: , Rfl:  .  nitrofurantoin (MACRODANTIN) 50 MG capsule, Take 50 mg by mouth as needed. UTI dose should be 100 mg bid x 5 days UTI  or 50-100 mg qhs prevention, Disp: , Rfl:  .  oxyCODONE-acetaminophen (PERCOCET) 7.5-325 MG tablet, Take 1 tablet by mouth every 6 (six) hours as needed for severe pain. Dr. Alvira Monday pain clinic [facility], Disp: , Rfl:  .  phentermine 37.5 MG capsule, TK 1 C PO QD, Disp: , Rfl:  .  traZODone (DESYREL) 100 MG tablet, Take 1-1.5 tablets (100-150 mg total) by mouth at bedtime as needed for sleep., Disp: 135 tablet, Rfl: 1 .  valACYclovir (VALTREX) 1000 MG tablet, Oral blister, Disp: , Rfl:  .  VIMOVO 500-20 MG TBEC, Take 1 tablet by mouth 2 (two) times daily., Disp: , Rfl: 2 .  calcium carbonate (OS-CAL - DOSED IN MG OF ELEMENTAL CALCIUM) 1250 (500 Ca) MG tablet, Take by mouth., Disp: , Rfl:  .  Cholecalciferol (VITAMIN D3) 25 MCG (1000 UT) CAPS, Take by mouth., Disp: , Rfl:  .  Cyanocobalamin (B-12 COMPLIANCE INJECTION IJ), Inject as directed every 3 (three) months., Disp: , Rfl:   EXAM:  VITALS per patient if applicable:  GENERAL: alert, oriented, appears well and in no acute distress  HEENT: atraumatic, conjunttiva clear, no obvious abnormalities on inspection of external nose and ears.  Voice does sound nasally/congested.  Patient has tenderness across bridge of nose and across forehead, states even having her glasses rest on nose is painful   NECK: normal movements of the head and neck  LUNGS: on inspection no signs of respiratory distress, breathing rate appears normal, no obvious gross SOB, gasping or wheezing  CV: no obvious cyanosis  MS: moves all visible extremities without noticeable abnormality  PSYCH/NEURO: pleasant and cooperative, no obvious depression or anxiety, speech and thought processing grossly intact  ASSESSMENT AND PLAN:  Discussed the following assessment and plan:  Acute sinusitis - patient symptoms do sound consistent with acute sinusitis infection.  Allergies reviewed, we will treat with doxycycline twice daily for 10 days.  Advised to try and attempt to do saline  nasal rinses to help wash out congested and continued to work with ENT for management of chronic sinus symptoms.   I discussed the assessment and treatment plan with the patient. The patient was provided an opportunity to ask questions and all were answered. The patient agreed with the plan and demonstrated an understanding of the instructions.   The patient was advised to call back or seek an in-person evaluation if the symptoms worsen or if the condition fails to improve as anticipated.  Jodelle Green, FNP

## 2019-03-25 ENCOUNTER — Encounter: Payer: Self-pay | Admitting: Psychiatry

## 2019-03-25 ENCOUNTER — Other Ambulatory Visit: Payer: Self-pay

## 2019-03-25 ENCOUNTER — Ambulatory Visit (INDEPENDENT_AMBULATORY_CARE_PROVIDER_SITE_OTHER): Payer: 59 | Admitting: Psychiatry

## 2019-03-25 DIAGNOSIS — G4701 Insomnia due to medical condition: Secondary | ICD-10-CM | POA: Diagnosis not present

## 2019-03-25 DIAGNOSIS — F411 Generalized anxiety disorder: Secondary | ICD-10-CM

## 2019-03-25 MED ORDER — HYDROXYZINE PAMOATE 25 MG PO CAPS: 25.0000 mg | ORAL_CAPSULE | Freq: Every day | ORAL | 1 refills | Status: DC | PRN

## 2019-03-25 MED ORDER — BUSPIRONE HCL 10 MG PO TABS: 10.0000 mg | ORAL_TABLET | Freq: Two times a day (BID) | ORAL | 1 refills | Status: DC

## 2019-03-25 NOTE — Progress Notes (Signed)
Virtual Visit via Video Note  I connected with Maureen Randall on 03/25/19 at  4:15 PM EDT by a video enabled telemedicine application and verified that I am speaking with the correct person using two identifiers.   I discussed the limitations of evaluation and management by telemedicine and the availability of in person appointments. The patient expressed understanding and agreed to proceed.   I discussed the assessment and treatment plan with the patient. The patient was provided an opportunity to ask questions and all were answered. The patient agreed with the plan and demonstrated an understanding of the instructions.   The patient was advised to call back or seek an in-person evaluation if the symptoms worsen or if the condition fails to improve as anticipated.  [place] MD OP Progress Note  03/25/2019 5:06 PM Maureen Randall  MRN:  [id number]  Chief Complaint:  Chief Complaint    Follow-up     HPI: Maureen Randall is a 53 year old Caucasian female, has a history of GAD, insomnia, chronic pain, primary osteoarthritis, lumbar radiculopathy, degenerative spondylolisthesis, bilateral carpal tunnel syndrome, recent hip surgery was evaluated by telemedicine today.  Patient today reports she has a rotator cuff injury on her left shoulder.  She reports she is scheduled for surgery on October 12.  She reports because of the pain her anxiety is currently worse.  She wonders whether she can be started on medications like clonazepam.  She reports she is currently taking Cymbalta at lower dosage and that has been very helpful.  She does not feel sluggish anymore.  She reports sleep is good on the trazodone.  She reports she had an appointment with Merleen Nicely beginning of September however would like to see her sooner since she is anxious at this time.  Discussed with patient to reach out to schedule a sooner appointment.  Discussed with patient that since she is on medications like oxycodone, benzodiazepines may not  be the best choice at this time.  Discussed with her that she can be started on BuSpar.  Also discussed giving hydroxyzine as needed.  She agrees with plan.  Patient denies any suicidality, homicidality or perceptual disturbances.   Visit Diagnosis:    ICD-10-CM   1. GAD (generalized anxiety disorder)  F41.1 busPIRone (BUSPAR) 10 MG tablet    hydrOXYzine (VISTARIL) 25 MG capsule  2. 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  Past trials of Xanax, diazepam, clonazepam, Pamelor  Past Medical History:  Past Medical History:  Diagnosis Date  . Anxiety   . Arthritis    osteoarthritis  . GERD (gastroesophageal reflux disease)     Past Surgical History:  Procedure Laterality Date  . ABLATION     veins in lower legs  . BREAST BIOPSY Left 2011   neg  . CARPAL TUNNEL RELEASE Right 12/2015   [facility]  . CARPAL TUNNEL RELEASE Left 12/2016  . FOOT SURGERY Right 2015   broken navicular bone  . HIP ARTHROSCOPY Right 05/2011  . HIP SURGERY     left hip surgery 05/2018  . REPLACEMENT TOTAL HIP W/  RESURFACING IMPLANTS Right 12/2017   [place] Right 12/2015   [facility] torn rotator cuff  . SPINAL FUSION  2016   [facility] lumbar    Family Psychiatric History: I have reviewed family psychiatric history from my progress note on 04/30/2018.  Family  History:  Family History  Problem Relation Age of Onset  . Breast cancer Maternal Aunt   . Breast cancer Paternal Aunt   . Dementia Paternal Aunt   . COPD Mother        quit smoking after 60 years  . Dementia Father     Social History: I have reviewed social history from my progress note on 04/30/2018. Social History   Socioeconomic History  . Marital status: Married    Spouse name: fredrick  . Number of children: 1  . Years of education: Not on file  . Highest education level: High  school graduate  Occupational History    Comment: full time  Social Needs  . Financial resource strain: Somewhat hard  . Food insecurity    Worry: Sometimes true    Inability: Sometimes true  . Transportation needs    Medical: No    Non-medical: No  Tobacco Use  . Smoking status: Never Smoker  . Smokeless tobacco: Never Used  Substance and Sexual Activity  . Alcohol use: Not Currently    Alcohol/week: 1.0 standard drinks    Types: 1 Cans of beer per week  . Drug use: Never  . Sexual activity: Yes  Lifestyle  . Physical activity    Days per week: 0 days    Minutes per session: 0 min  . Stress: Very much  Relationships  . Social Herbalist on phone: Not on file    Gets together: Not on file    Attends religious service: Never    Active member of club or organization: No    Attends meetings of clubs or organizations: Never    Relationship status: Married  Other Topics Concern  . 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 Amoxicillin Hives  . Cephalexin     rash  . Septra [Sulfamethoxazole-Trimethoprim]     hives    Metabolic Disorder Labs: Lab Results  Component Value Date   HGBA1C 5.7 01/05/2019   No results found for: PROLACTIN Lab Results  Component Value Date   CHOL 250 (H) 01/05/2019   TRIG 93.0 01/05/2019   HDL 75.90 01/05/2019   CHOLHDL 3 01/05/2019   VLDL 18.6 01/05/2019   LDLCALC 156 (H) 01/05/2019   Lab Results  Component Value Date   TSH 3.78 01/05/2019    Therapeutic Level Labs: No results found for: LITHIUM No results found for: VALPROATE No components found for:  CBMZ  Current Medications: Current Outpatient Medications  Medication Sig Dispense Refill  . albuterol (VENTOLIN HFA) 108 (90 Base) MCG/ACT inhaler Inhale 2 puffs into the lungs every 6 (six) hours as needed for wheezing or shortness of breath. 18 g  2  . aspirin EC 81 MG tablet 1 twice a day with food for 4 weeks.  Do not start until eliquis completed    . azelastine (ASTELIN) 0.1 % nasal spray INSTILL 1 SPRAY IEN D FOR ALLERGIES    . budesonide-formoterol (SYMBICORT) 160-4.5 MCG/ACT inhaler 2 puffs twice daily. 1 Inhaler 5  . busPIRone (BUSPAR) 10 MG tablet Take 1 tablet (10 mg total) by mouth 2 (two) times daily. 60 tablet 1  . calcium carbonate (OS-CAL - DOSED IN MG OF ELEMENTAL CALCIUM) 1250 (500 Ca) MG tablet Take by mouth.    . Cholecalciferol (VITAMIN D3) 25 MCG (1000 UT)  CAPS Take by mouth.    . Cyanocobalamin (B-12 COMPLIANCE INJECTION IJ) Inject as directed every 3 (three) months.    . diclofenac sodium (VOLTAREN) 1 % GEL   11  . doxycycline (VIBRA-TABS) 100 MG tablet Take 1 tablet (100 mg total) by mouth 2 (two) times daily. 20 tablet 0  . DULoxetine (CYMBALTA) 60 MG capsule Take 1 capsule (60 mg total) by mouth daily. 90 capsule 1  . Elastic Bandages & Supports (RELIEF KNEE) MISC Apply in the morning and remove at night.    . estradiol (ESTRACE) 0.1 MG/GM vaginal cream INSERT PEA SIZED AMOUNT (0.5G) INTO VAGINA NIGHTLY EVERY OTHER NIGHT FOR 2 WEEKS, THEN TWICE WEEKLY    . fluticasone (FLONASE) 50 MCG/ACT nasal spray Place into both nostrils daily.    . hydrOXYzine (VISTARIL) 25 MG capsule Take 1-2 capsules (25-50 mg total) by mouth daily as needed for anxiety. For severe anxiety attacks only 60 capsule 1  . ibuprofen (ADVIL,MOTRIN) 800 MG tablet TK 1 T PO Q 8 H WF PRN P  2  . methocarbamol (ROBAXIN) 500 MG tablet Take 500 mg by mouth every 8 (eight) hours as needed for muscle spasms.    . mometasone (ELOCON) 0.1 % lotion APP EXT AA BID FOR 12 DAYS FOR ITCHY EARS. MAY REPEAT PRF ITCHY EARS    . Multiple Vitamin (MULTI-VITAMINS) TABS Take by mouth.    . nitrofurantoin (MACRODANTIN) 50 MG capsule Take 50 mg by mouth as needed. UTI dose should be 100 mg bid x 5 days UTI  or 50-100 mg qhs prevention    . oxyCODONE-acetaminophen  (PERCOCET) 7.5-325 MG tablet Take 1 tablet by mouth every 6 (six) hours as needed for severe pain. Dr. Alvira Monday pain clinic [facility]    . phentermine 37.5 MG capsule TK 1 C PO QD    . traZODone (DESYREL) 100 MG tablet Take 1-1.5 tablets (100-150 mg total) by mouth at bedtime as needed for sleep. 135 tablet 1  . valACYclovir (VALTREX) 1000 MG tablet Oral blister    . VIMOVO 500-20 MG TBEC Take 1 tablet by mouth 2 (two) times daily.  2   No current facility-administered medications for this visit.      Musculoskeletal: Strength & Muscle Tone: UTA Gait & Station: Observed as seated Patient leans: N/A  Psychiatric Specialty Exam: Review of Systems  Musculoskeletal: Positive for joint pain.  Psychiatric/Behavioral: The patient is nervous/anxious.   All other systems reviewed and are negative.   There were no vitals taken for this visit.There is no height or weight on file to calculate BMI.  General Appearance: Casual  Eye Contact:  Fair  Speech:  Clear and Coherent  Volume:  Normal  Mood:  Anxious  Affect:  Congruent  Thought Process:  Goal Directed and Descriptions of Associations: Intact  Orientation:  Full (Time, Place, and Person)  Thought Content: Logical   Suicidal Thoughts:  No  Homicidal Thoughts:  No  Memory:  Immediate;   Fair Recent;   Fair Remote;   Fair  Judgement:  Fair  Insight:  Fair  Psychomotor Activity:  Normal  Concentration:  Concentration: Fair and Attention Span: Fair  Recall:  [employer] of Knowledge: Fair  Language: Fair  Akathisia:  No  Handed:  Right  AIMS (if indicated):denies tremors, rigidity  Assets:  Communication Skills Desire for Improvement Social Support  ADL's:  Intact  Cognition: WNL  Sleep:  Fair   Screenings: PHQ2-9     Office Visit  from 03/13/2019 in [facility] from 02/11/2018 in [facility] Office Visit from 01/29/2018 in [facility]  PHQ-2 Total Score  0  0  0  PHQ-9 Total Score  0  -  -       Assessment and Plan: Maureen Randall is a 53 year old Caucasian female, married, employed, currently on long-term disability, lives in [place], has a history of GAD, insomnia, chronic pain, osteoarthritis, degenerative spondylolisthesis, lumbar radiculopathy, bilateral carpal tunnel syndrome, multiple surgeries was evaluated by telemedicine today.  Patient continues to have psychosocial stressors of her health issues especially her pain.  She is currently struggling with anxiety symptoms and will benefit from medication readjustment.  Plan For GAD- unstable Continue Cymbalta at reduced dosage of 60 mg p.o. daily.  Patient did not tolerate higher dosage of Cymbalta due to cognitive issues. Start BuSpar 10 mg p.o. twice daily Change hydroxyzine to 25 to 50 mg p.o. daily as needed for anxiety attacks Discussed with patient to continue to work with her therapist Ms. Alden Hipp.  For insomnia-improving Trazodone 100 to 150 mg p.o. nightly as needed  Follow-up in clinic in 4 weeks or sooner if needed.  October 26 at 10 AM.  I have spent atleast 15 minutes non face to face with patient today. More than 50 % of the time was spent for psychoeducation and supportive psychotherapy and care coordination. This note was generated in part or whole with voice recognition software. Voice recognition is usually quite accurate but there are transcription errors that can and very often do occur. I apologize for any typographical errors that were not detected and corrected.       Ursula Alert, MD 03/25/2019, 5:06 PM

## 2019-03-31 ENCOUNTER — Other Ambulatory Visit: Payer: Self-pay

## 2019-03-31 ENCOUNTER — Ambulatory Visit: Payer: 59 | Admitting: Licensed Clinical Social Worker

## 2019-04-01 ENCOUNTER — Ambulatory Visit (INDEPENDENT_AMBULATORY_CARE_PROVIDER_SITE_OTHER): Payer: 59 | Admitting: Internal Medicine

## 2019-04-01 VITALS — Ht 67.0 in | Wt 220.0 lb

## 2019-04-01 DIAGNOSIS — M67912 Unspecified disorder of synovium and tendon, left shoulder: Secondary | ICD-10-CM | POA: Diagnosis not present

## 2019-04-01 DIAGNOSIS — R7303 Prediabetes: Secondary | ICD-10-CM | POA: Diagnosis not present

## 2019-04-01 DIAGNOSIS — G4701 Insomnia due to medical condition: Secondary | ICD-10-CM

## 2019-04-01 DIAGNOSIS — G8929 Other chronic pain: Secondary | ICD-10-CM | POA: Diagnosis not present

## 2019-04-01 DIAGNOSIS — K219 Gastro-esophageal reflux disease without esophagitis: Secondary | ICD-10-CM

## 2019-04-01 DIAGNOSIS — J3489 Other specified disorders of nose and nasal sinuses: Secondary | ICD-10-CM

## 2019-04-01 DIAGNOSIS — Z23 Encounter for immunization: Secondary | ICD-10-CM

## 2019-04-01 DIAGNOSIS — N3000 Acute cystitis without hematuria: Secondary | ICD-10-CM

## 2019-04-01 DIAGNOSIS — R51 Headache: Secondary | ICD-10-CM

## 2019-04-01 DIAGNOSIS — J4520 Mild intermittent asthma, uncomplicated: Secondary | ICD-10-CM

## 2019-04-01 DIAGNOSIS — R519 Headache, unspecified: Secondary | ICD-10-CM

## 2019-04-01 MED ORDER — NITROFURANTOIN MACROCRYSTAL 50 MG PO CAPS: 50.0000 mg | ORAL_CAPSULE | ORAL | 3 refills | Status: DC | PRN

## 2019-04-01 MED ORDER — METHOCARBAMOL 500 MG PO TABS: 500.0000 mg | ORAL_TABLET | Freq: Two times a day (BID) | ORAL | 2 refills | Status: DC | PRN

## 2019-04-01 MED ORDER — OMEPRAZOLE 20 MG PO CPDR: 20.0000 mg | DELAYED_RELEASE_CAPSULE | Freq: Every day | ORAL | 0 refills | Status: DC

## 2019-04-01 NOTE — Progress Notes (Addendum)
Telephone Note  I connected with Gean Maidens  on 04/02/19 at  2:30 PM EDT by a telephone and verified that I am speaking with the correct person using two identifiers.  Location patient: home Location provider:work or home office Persons participating in the virtual visit: patient, provider  I discussed the limitations of evaluation and management by telemedicine and the availability of in person appointments. The patient expressed understanding and agreed to proceed.   HPI: 1. Fall 12/2018 to left side and tore left rotator cuff spur and labrum and bicep tendonitis surgery 04/20/2019 with Dr. [name] [facility] in [place]   2. GAD controlled on cymbalta 60 mg qd and f/u with psych. Sleep improved   3. New asthma dx symbicort and prn albuterol helping   4. Chronic pain had radiofreq recently L3/4 and injection to numb low back above her hardware still feels pressure w/o relief since 03/31/19 they advised this is normal x 2 weeks will f/u with pain clinic   5. C/o sinus pressure and intermittent h/a still recently Doxycycline course helped but symptoms return reviewed Xray sinus 09/23/2018 negative and f/u with Dr. Tami Ribas ENT she is taking zyrtec daily and flonase, asteline w/o relief. Also she has been tested for allergies before and negative    ROS: See pertinent positives and negatives per HPI.  Past Medical History:  Diagnosis Date  . Anxiety   . Arthritis    osteoarthritis  . GERD (gastroesophageal reflux disease)     Past Surgical History:  Procedure Laterality Date  . ABLATION     veins in lower legs  . BREAST BIOPSY Left 2011   neg  . CARPAL TUNNEL RELEASE Right 12/2015   [facility]  . CARPAL TUNNEL RELEASE Left 12/2016  . FOOT SURGERY Right 2015   broken navicular bone  . HIP ARTHROSCOPY Right 05/2011  . HIP SURGERY     left hip surgery 05/2018  . REPLACEMENT TOTAL HIP W/  RESURFACING IMPLANTS Right 12/2017   [place] Right 12/2015   [facility] torn rotator cuff  . SPINAL FUSION  2016   [facility] lumbar    Family History  Problem Relation Age of Onset  . Breast cancer Maternal Aunt   . Breast cancer Paternal Aunt   . Dementia Paternal Aunt   . COPD Mother        quit smoking after 60 years  . 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  albuterol (VENTOLIN HFA) 108 (90 Base) MCG/ACT inhaler, Inhale 2 puffs into the lungs every 6 (six) hours as needed for wheezing or shortness of breath., Disp: 18 g, Rfl: 2 .  aspirin EC 81 MG tablet, 1 twice a day with food for 4 weeks.  Do not start until eliquis completed, Disp: , Rfl:  .  azelastine (ASTELIN) 0.1 % nasal spray, INSTILL 1 SPRAY IEN D FOR ALLERGIES, Disp: , Rfl:  .  budesonide-formoterol (SYMBICORT) 160-4.5 MCG/ACT inhaler, 2 puffs twice daily., Disp: 1 Inhaler, Rfl: 5 .  busPIRone (BUSPAR) 10 MG tablet, Take 1 tablet (10 mg total) by mouth 2 (two) times daily., Disp: 60 tablet, Rfl: 1 .  calcium carbonate (OS-CAL - DOSED IN MG OF ELEMENTAL CALCIUM) 1250 (500 Ca) MG tablet, Take by mouth., Disp: , Rfl:  .  Cholecalciferol (VITAMIN D3) 25 MCG (1000 UT) CAPS, Take by mouth.,  Disp: , Rfl:  .  Cyanocobalamin (B-12 COMPLIANCE INJECTION IJ), Inject as directed every 3 (three) months., Disp: , Rfl:  .  diclofenac sodium (VOLTAREN) 1 % GEL, , Disp: , Rfl: 11 .  DULoxetine (CYMBALTA) 60 MG capsule, Take 1 capsule (60 mg total) by mouth daily., Disp: 90 capsule, Rfl: 1 .  Elastic Bandages & Supports (RELIEF KNEE) MISC, Apply in the morning and remove at night., Disp: , Rfl:  .  estradiol (ESTRACE) 0.1 MG/GM vaginal cream, INSERT PEA SIZED AMOUNT (0.5G) INTO VAGINA NIGHTLY EVERY OTHER NIGHT FOR 2 WEEKS, THEN TWICE WEEKLY, Disp: , Rfl:  .  fluticasone (FLONASE) 50 MCG/ACT nasal spray, Place into both nostrils daily., Disp: , Rfl:  .  hydrOXYzine (VISTARIL) 25 MG capsule, Take 1-2  capsules (25-50 mg total) by mouth daily as needed for anxiety. For severe anxiety attacks only, Disp: 60 capsule, Rfl: 1 .  ibuprofen (ADVIL,MOTRIN) 800 MG tablet, TK 1 T PO Q 8 H WF PRN P, Disp: , Rfl: 2 .  methocarbamol (ROBAXIN) 500 MG tablet, Take 1 tablet (500 mg total) by mouth 2 (two) times daily as needed for muscle spasms., Disp: 60 tablet, Rfl: 2 .  mometasone (ELOCON) 0.1 % lotion, APP EXT AA BID FOR 12 DAYS FOR ITCHY EARS. MAY REPEAT PRF ITCHY EARS, Disp: , Rfl:  .  Multiple Vitamin (MULTI-VITAMINS) TABS, Take by mouth., Disp: , Rfl:  .  nitrofurantoin (MACRODANTIN) 50 MG capsule, Take 1 capsule (50 mg total) by mouth as needed. UTI dose should be 100 mg bid x 5 days UTI  or 50-100 mg qhs prevention, Disp: 120 capsule, Rfl: 3 .  oxyCODONE-acetaminophen (PERCOCET) 7.5-325 MG tablet, Take 1 tablet by mouth every 6 (six) hours as needed for severe pain. Dr. Alvira Monday pain clinic [facility], Disp: , Rfl:  .  phentermine 37.5 MG capsule, TK 1 C PO QD, Disp: , Rfl:  .  traZODone (DESYREL) 100 MG tablet, Take 1-1.5 tablets (100-150 mg total) by mouth at bedtime as needed for sleep., Disp: 135 tablet, Rfl: 1 .  valACYclovir (VALTREX) 1000 MG tablet, Oral blister, Disp: , Rfl:  .  VIMOVO 500-20 MG TBEC, Take 1 tablet by mouth 2 (two) times daily., Disp: , Rfl: 2 .  omeprazole (PRILOSEC) 20 MG capsule, Take 1 capsule (20 mg total) by mouth daily., Disp: 30 capsule, Rfl: 0 .  Tdap (BOOSTRIX) 5-2.5-18.5 LF-MCG/0.5 injection, Inject 0.5 mLs into the muscle once for 1 dose., Disp: 0.5 mL, Rfl: 0  EXAM:  VITALS per patient if applicable:  GENERAL: alert, oriented, appears well and in no acute distress  HEENT: atraumatic, conjunttiva clear, no obvious abnormalities on inspection of external nose and ears  NECK: normal movements of the head and neck  LUNGS: on inspection no signs of respiratory distress, breathing rate appears normal, no obvious gross SOB, gasping or wheezing  CV: no obvious  cyanosis  MS: moves all visible extremities without noticeable abnormality  PSYCH/NEURO: pleasant and cooperative, no obvious depression or anxiety, speech and thought processing grossly intact  ASSESSMENT AND PLAN:  Discussed the following assessment and plan:  Disorder of left rotator cuff-surg sch 04/20/19 Dr. [name]   Prediabetes-rec healthy diet and exercise   Gastroesophageal reflux disease, esophagitis presence not specified - Plan: omeprazole (PRILOSEC) 20 MG capsule  Other chronic pain - Plan: methocarbamol (ROBAXIN) 500 MG tablet  Acute cystitis without hematuria - Plan: nitrofurantoin (MACRODANTIN) 50 MG capsule qhs and bid prn x 5 days  Insomnia improved  f/u with psych   Mild intermittent asthma without complication symbicort and prn Albuterol   Sinus pressure Nonintractable episodic headache, unspecified headache type -will do CT with and w/o brain to r/o sinus vs brain etiology of symptoms  And have pt f/u with ENT Dr. Tami Ribas   HM Declines flu shot  Tdap due possibly will check [facility] Disc shingrix today  Consider hep B vaccine in future NR 08/31/15  HCV neg 08/31/15   Pap neg 09/11/16 neg neg HPV  Will f/u Dr. Leafy Ro has IUD upcoming appt 04/2019  Mammogram 08/05/18 negative   Colonoscopy never had discussed with pt today she will think about it but cant again due to left shoulder surgery pending   Dermatology was due 09/2018 f/u usu. Yearly [facility] Never smoker   Other providers Former PCP [facility] Dr. Garen Grams then [facility] requested all imaging Xrays, MRIS, CT Pain Dr. Alvira Monday in [place] specialist Dr. Rennis Harding will establish  ENT-Dr. Tami Ribas stated pt did not have allergies of note had testing Psychiatry and therapy-following  PT  Of note meds pt gets from PCP  Valtrex  Vimovo  adipex  Macrobid  Robaxin  voltaren gel    04/14/19 ENT Dr. Tami Ribas sinusitis deviated nasal septum  right nasal endoscopy and left nasacort 55 nasal hoarse voice small hemorrhage right cord 2/2 coughing and inhalers f/u in 6 weeks after shoulder surgery   -we discussed possible serious and likely etiologies, options for evaluation and workup, limitations of telemedicine visit vs in person visit, treatment, treatment risks and precautions. Pt prefers to treat via telemedicine empirically rather then risking or undertaking an in person visit at this moment. Patient agrees to seek prompt in person care if worsening, new symptoms arise, or if is not improving with treatment.   I discussed the assessment and treatment plan with the patient. The patient was provided an opportunity to ask questions and all were answered. The patient agreed with the plan and demonstrated an understanding of the instructions.   The patient was advised to call back or seek an in-person evaluation if the symptoms worsen or if the condition fails to improve as anticipated.  Time spent 25 minutes  Delorise Jackson, MD

## 2019-04-02 DIAGNOSIS — M67912 Unspecified disorder of synovium and tendon, left shoulder: Secondary | ICD-10-CM | POA: Insufficient documentation

## 2019-04-02 DIAGNOSIS — J45909 Unspecified asthma, uncomplicated: Secondary | ICD-10-CM | POA: Insufficient documentation

## 2019-04-02 MED ORDER — TETANUS-DIPHTH-ACELL PERTUSSIS 5-2.5-18.5 LF-MCG/0.5 IM SUSP: 0.5000 mL | Freq: Once | INTRAMUSCULAR | 0 refills | Status: AC

## 2019-04-15 ENCOUNTER — Other Ambulatory Visit: Payer: Self-pay | Admitting: Internal Medicine

## 2019-04-15 MED ORDER — ALBUTEROL SULFATE HFA 108 (90 BASE) MCG/ACT IN AERS: 2.0000 | INHALATION_SPRAY | Freq: Four times a day (QID) | RESPIRATORY_TRACT | 2 refills | Status: DC | PRN

## 2019-04-17 ENCOUNTER — Other Ambulatory Visit: Payer: Self-pay

## 2019-04-17 ENCOUNTER — Ambulatory Visit (INDEPENDENT_AMBULATORY_CARE_PROVIDER_SITE_OTHER): Payer: 59 | Admitting: Licensed Clinical Social Worker

## 2019-04-17 ENCOUNTER — Encounter: Payer: Self-pay | Admitting: Licensed Clinical Social Worker

## 2019-04-17 DIAGNOSIS — F411 Generalized anxiety disorder: Secondary | ICD-10-CM

## 2019-04-17 NOTE — Progress Notes (Signed)
Virtual Visit via Video Note  I connected with [name] on 04/17/19 at  8:00 AM EDT by a video enabled telemedicine application and verified that I am speaking with the correct person using two identifiers.   I discussed the limitations of evaluation and management by telemedicine and the availability of in person appointments. The patient expressed understanding and agreed to proceed.    I discussed the assessment and treatment plan with the patient. The patient was provided an opportunity to ask questions and all were answered. The patient agreed with the plan and demonstrated an understanding of the instructions.   The patient was advised to call back or seek an in-person evaluation if the symptoms worsen or if the condition fails to improve as anticipated.  I provided 60 minutes of non-face-to-face time during this encounter.   Alden Hipp, LCSW    THERAPIST PROGRESS NOTE  Session Time: 0900  Participation Level: Active  Behavioral Response: NeatAlertAnxious  Type of Therapy: Individual Therapy  Treatment Goals addressed: Anxiety  Interventions: CBT  Summary: Maureen Randall is a 53 y.o. female who presents with continued symptoms related to her diagnosis. Havannah reports doing well since our last session. She reports her shoulder surgery is scheduled for this coming Monday, and she is experiencing overwhelming anxiety regarding that surgery. She reported remembering how intense the pain was following her last shoulder surgery, and not feeling up to the task this time around. LCSW encouraged Komal to find positives throughout this experience. Kamori was having difficulty coming up with any positives, so the majority of our session was spent attempting to do that. LCSW pointed out that Airis would have better mobility of her shoulder following the surgery, and because she is doing physical therapy will be mobile only 3 days after surgery. Beautiful was able to accept these as  positives and expressed agreement. We discussed several other ways Chyvonne can continue to maintain positivity throughout this experience, but also noted that anxiety is a normal reaction to an upcoming surgery. We discussed ways to manage the anxiety in the moment, by seeking more information, challenging negative thoughts, etc. Claiborne Billings expressed understanding and agreement with this information as well.  Suicidal/Homicidal: No  Therapist Response: Ayonna continues to work towards her tx goals but has not yet reached them. We will continue to work on emotional regulation skills moving forward.   Plan: Return again in 4 weeks.  Diagnosis: Axis I: Generalized Anxiety Disorder    Axis II: No diagnosis    Alden Hipp, LCSW 04/17/2019

## 2019-04-21 ENCOUNTER — Ambulatory Visit: Payer: 59

## 2019-05-02 ENCOUNTER — Other Ambulatory Visit: Payer: Self-pay | Admitting: Psychiatry

## 2019-05-02 DIAGNOSIS — F411 Generalized anxiety disorder: Secondary | ICD-10-CM

## 2019-05-04 ENCOUNTER — Other Ambulatory Visit: Payer: Self-pay

## 2019-05-04 ENCOUNTER — Ambulatory Visit (INDEPENDENT_AMBULATORY_CARE_PROVIDER_SITE_OTHER): Payer: 59 | Admitting: Psychiatry

## 2019-05-04 ENCOUNTER — Encounter: Payer: Self-pay | Admitting: Psychiatry

## 2019-05-04 DIAGNOSIS — F411 Generalized anxiety disorder: Secondary | ICD-10-CM | POA: Diagnosis not present

## 2019-05-04 DIAGNOSIS — G4701 Insomnia due to medical condition: Secondary | ICD-10-CM

## 2019-05-04 MED ORDER — BUSPIRONE HCL 10 MG PO TABS: 10.0000 mg | ORAL_TABLET | Freq: Two times a day (BID) | ORAL | 0 refills | Status: DC

## 2019-05-04 NOTE — Telephone Encounter (Signed)
Patient does not need cymbalta refill at this time.

## 2019-05-04 NOTE — Progress Notes (Signed)
Virtual Visit via Video Note  I connected with Maureen Randall on 05/04/19 at 10:00 AM EDT by a video enabled telemedicine application and verified that I am speaking with the correct person using two identifiers.   I discussed the limitations of evaluation and management by telemedicine and the availability of in person appointments. The patient expressed understanding and agreed to proceed.   I discussed the assessment and treatment plan with the patient. The patient was provided an opportunity to ask questions and all were answered. The patient agreed with the plan and demonstrated an understanding of the instructions.   The patient was advised to call back or seek an in-person evaluation if the symptoms worsen or if the condition fails to improve as anticipated.   [place] MD OP Progress Note  05/04/2019 2:58 PM Maureen Randall  MRN:  [id number]  Chief Complaint:  Chief Complaint    Follow-up     HPI: Maureen Randall is a 53 year old Caucasian female who has a history of GAD, insomnia, chronic pain, primary osteoarthritis, lumbar radiculopathy, degenerative disc spondylolisthesis, bilateral carpal tunnel syndrome, recent surgery was evaluated by telemedicine today.  Patient reports she had her rotator cuff shoulder repair-left-sided 2 weeks ago.  She reports she continues to be in physical therapy and that has been painful for her.  She is trying to manage it by taking her medications as prescribed.  She reports mood as okay on the current medication regimen.  She reports she is tolerating the Cymbalta at this dosage well.  Sleep continues to be a problem more so because of pain.  She does have trazodone available.  Patient denies any suicidality, homicidality or perceptual disturbances.  Patient denies any other concerns today. Visit Diagnosis:    ICD-10-CM   1. GAD (generalized anxiety disorder)  F41.1 busPIRone (BUSPAR) 10 MG tablet  2. Insomnia due to medical condition  G47.01     Past  Psychiatric History: I have reviewed past psychiatric history from my progress note on 04/30/2018.  Past trials of Xanax, diazepam, clonazepam, Pamelor.  Past Medical History:  Past Medical History:  Diagnosis Date  . Anxiety   . Arthritis    osteoarthritis  . GERD (gastroesophageal reflux disease)     Past Surgical History:  Procedure Laterality Date  . ABLATION     veins in lower legs  . BREAST BIOPSY Left 2011   neg  . CARPAL TUNNEL RELEASE Right 12/2015   [facility]  . CARPAL TUNNEL RELEASE Left 12/2016  . FOOT SURGERY Right 2015   broken navicular bone  . HIP ARTHROSCOPY Right 05/2011  . HIP SURGERY     left hip surgery 05/2018  . REPLACEMENT TOTAL HIP W/  RESURFACING IMPLANTS Right 12/2017   [place] Right 12/2015   [facility] torn rotator cuff  . SPINAL FUSION  2016   [facility] lumbar    Family Psychiatric History: I have reviewed family psychiatric history from my progress note on 04/30/2018.  Family History:  Family History  Problem Relation Age of Onset  . Breast cancer Maternal Aunt   . Breast cancer Paternal Aunt   . Dementia Paternal Aunt   . COPD Mother        quit smoking after 60 years  . Dementia Father     Social History: Reviewed social history from my progress note on 04/30/2018. Social History   Socioeconomic History  . Marital status: Married    Spouse name: Maureen Randall  .  Number of children: 1  . Years of education: Not on file  . Highest education level: High school graduate  Occupational History    Comment: full time  Social Needs  . Financial resource strain: Somewhat hard  . Food insecurity    Worry: Sometimes true    Inability: Sometimes true  . Transportation needs    Medical: No    Non-medical: No  Tobacco Use  . Smoking status: Never Smoker  . Smokeless tobacco: Never Used  Substance and Sexual Activity  . Alcohol use: Not Currently    Alcohol/week: 1.0 standard drinks    Types: 1 Cans  of beer per week  . Drug use: Never  . Sexual activity: Yes  Lifestyle  . Physical activity    Days per week: 0 days    Minutes per session: 0 min  . Stress: Very much  Relationships  . Social Herbalist on phone: Not on file    Gets together: Not on file    Attends religious service: Never    Active member of club or organization: No    Attends meetings of clubs or organizations: Never    Relationship status: Married  Other Topics Concern  . 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 Amoxicillin Hives  . Cephalexin     rash  . Septra [Sulfamethoxazole-Trimethoprim]     hives    Metabolic Disorder Labs: Lab Results  Component Value Date   HGBA1C 5.7 01/05/2019   No results found for: PROLACTIN Lab Results  Component Value Date   CHOL 250 (H) 01/05/2019   TRIG 93.0 01/05/2019   HDL 75.90 01/05/2019   CHOLHDL 3 01/05/2019   VLDL 18.6 01/05/2019   LDLCALC 156 (H) 01/05/2019   Lab Results  Component Value Date   TSH 3.78 01/05/2019    Therapeutic Level Labs: No results found for: LITHIUM No results found for: VALPROATE No components found for:  CBMZ  Current Medications: Current Outpatient Medications  Medication Sig Dispense Refill  . albuterol (VENTOLIN HFA) 108 (90 Base) MCG/ACT inhaler Inhale 2 puffs into the lungs every 6 (six) hours as needed for wheezing or shortness of breath. 18 g 2  . aspirin EC 81 MG tablet 1 twice a day with food for 4 weeks.  Do not start until eliquis completed    . azelastine (ASTELIN) 0.1 % nasal spray INSTILL 1 SPRAY IEN D FOR ALLERGIES    . budesonide-formoterol (SYMBICORT) 160-4.5 MCG/ACT inhaler 2 puffs twice daily. 1 Inhaler 5  . busPIRone (BUSPAR) 10 MG tablet Take 1 tablet (10 mg total) by mouth 2 (two) times daily. 180 tablet 0  . calcium carbonate (OS-CAL - DOSED IN MG OF ELEMENTAL CALCIUM)  1250 (500 Ca) MG tablet Take by mouth.    . Cholecalciferol (VITAMIN D3) 25 MCG (1000 UT) CAPS Take by mouth.    . Cyanocobalamin (B-12 COMPLIANCE INJECTION IJ) Inject as directed every 3 (three) months.    . cyclobenzaprine (FLEXERIL) 10 MG tablet Take 10 mg by mouth every 6 (six) hours as needed.    . cyclobenzaprine (FLEXERIL) 10 MG tablet cyclobenzaprine 10 mg tablet  TK 1 T PO Q 6-8 H PRF SPASM    . diazepam (VALIUM) 10 MG tablet diazepam 10 mg tablet  TK 1 T PO 30 TO 45 MIN PRIOR  TO PROCEDURE    . diazepam (VALIUM) 10 MG tablet TK 1 T PO 30 TO 45 MIN PRIOR TO PROCEDURE    . diclofenac sodium (VOLTAREN) 1 % GEL   11  . DULoxetine (CYMBALTA) 60 MG capsule Take 1 capsule (60 mg total) by mouth daily. 90 capsule 1  . Elastic Bandages & Supports (RELIEF KNEE) MISC Apply in the morning and remove at night.    . estradiol (ESTRACE) 0.1 MG/GM vaginal cream INSERT PEA SIZED AMOUNT (0.5G) INTO VAGINA NIGHTLY EVERY OTHER NIGHT FOR 2 WEEKS, THEN TWICE WEEKLY    . fluticasone (FLONASE) 50 MCG/ACT nasal spray Place into both nostrils daily.    . hydrOXYzine (VISTARIL) 25 MG capsule Take 1-2 capsules (25-50 mg total) by mouth daily as needed for anxiety. For severe anxiety attacks only 60 capsule 1  . ibuprofen (ADVIL,MOTRIN) 800 MG tablet TK 1 T PO Q 8 H WF PRN P  2  . methocarbamol (ROBAXIN) 500 MG tablet Take 1 tablet (500 mg total) by mouth 2 (two) times daily as needed for muscle spasms. 60 tablet 2  . mometasone (ELOCON) 0.1 % lotion APP EXT AA BID FOR 12 DAYS FOR ITCHY EARS. MAY REPEAT PRF ITCHY EARS    . Multiple Vitamin (MULTI-VITAMINS) TABS Take by mouth.    . nitrofurantoin (MACRODANTIN) 50 MG capsule Take 1 capsule (50 mg total) by mouth as needed. UTI dose should be 100 mg bid x 5 days UTI  or 50-100 mg qhs prevention 120 capsule 3  . omeprazole (PRILOSEC) 20 MG capsule Take 1 capsule (20 mg total) by mouth daily. 30 capsule 0  . ondansetron (ZOFRAN) 4 MG tablet Take 4 mg by mouth every 6  (six) hours as needed.    . ondansetron (ZOFRAN) 4 MG tablet ondansetron HCl 4 mg tablet    . oxyCODONE-acetaminophen (PERCOCET) 10-325 MG tablet Percocet 10 mg-325 mg tablet  Take 1 tablet every 6 hours by oral route as needed for 5 days.    [name] oxyCODONE-acetaminophen (PERCOCET) 10-325 MG tablet     . oxyCODONE-acetaminophen (PERCOCET) 7.5-325 MG tablet Take 1 tablet by mouth every 6 (six) hours as needed for severe pain. Dr. Alvira Monday pain clinic [facility]    . phentermine 37.5 MG capsule TK 1 C PO QD    . traZODone (DESYREL) 100 MG tablet Take 1-1.5 tablets (100-150 mg total) by mouth at bedtime as needed for sleep. 135 tablet 1  . valACYclovir (VALTREX) 1000 MG tablet Oral blister    . VIMOVO 500-20 MG TBEC Take 1 tablet by mouth 2 (two) times daily.  2   No current facility-administered medications for this visit.      Musculoskeletal: Strength & Muscle Tone: UTA Gait & Station: Observed as seated Patient leans: N/A  Psychiatric Specialty Exam: Review of Systems  Musculoskeletal:       Left shoulder pain - S/P surgery, back pain  Psychiatric/Behavioral: The patient is nervous/anxious and has insomnia.   All other systems reviewed and are negative.   There were no vitals taken for this visit.There is no height or weight on file to calculate BMI.  General Appearance: Casual  Eye Contact:  Fair  Speech:  Clear and Coherent  Volume:  Normal  Mood:  Anxious  Affect:  Congruent  Thought Process:  Goal Directed and Descriptions of Associations: Intact  Orientation:  Full (Time, Place, and Person)  Thought Content: Logical   Suicidal Thoughts:  No  Homicidal Thoughts:  No  Memory:  Immediate;   Fair Recent;   Fair Remote;   Fair  Judgement:  Fair  Insight:  Fair  Psychomotor Activity:  Normal  Concentration:  Concentration: Fair and Attention Span: Fair  Recall:  [employer] of Knowledge: Fair  Language: Fair  Akathisia:  No  Handed:  Right  AIMS (if indicated): denies  tremors, rigidity  Assets:  Communication Skills Desire for Improvement Housing Social Support  ADL's:  Intact  Cognition: WNL  Sleep:  restless due to pain   Screenings: PHQ2-9     Office Visit from 03/13/2019 in [place] from 02/11/2018 in [facility] Office Visit from 01/29/2018 in [place]  PHQ-2 Total Score  0  0  0  PHQ-9 Total Score  0  -  -       Assessment and Plan: Cabrielle is a 53 year old Caucasian female, married, employed, currently on long-term disability, lives in [place], has a history of GAD, insomnia, chronic pain, osteoarthritis, degenerative spondylolisthesis, lumbar radiculopathy, bilateral carpal tunnel syndrome, multiple surgeries was evaluated by telemedicine today.  Patient with psychosocial stressors of her health issues including recent surgery as well as pain.  She will continue to benefit from medications.  Discussed with patient to continue psychotherapy sessions.  Plan GAD-improving Cymbalta 60 mg p.o. daily.  She did not tolerate the higher dosage due to cognitive side effects BuSpar 10 mg p.o. twice daily Hydroxyzine 25 to 50 mg p.o. daily as needed for anxiety attacks Continue psychotherapy sessions with therapist Ms. Alden Hipp.   Insomnia-improving Trazodone 100 to 150 mg p.o. nightly as needed  Follow-up in clinic in 6 weeks or sooner if needed.  December 14 at 10 AM  I have spent atleast 15 minutes non face to face with patient today. More than 50 % of the time was spent for psychoeducation and supportive psychotherapy and care coordination. This note was generated in part or whole with voice recognition software. Voice recognition is usually quite accurate but there are transcription errors that can and very often do occur. I apologize for any typographical errors that were not detected and  corrected.      Ursula Alert, MD 05/04/2019, 2:58 PM

## 2019-05-11 ENCOUNTER — Telehealth: Payer: Self-pay

## 2019-05-11 DIAGNOSIS — G4701 Insomnia due to medical condition: Secondary | ICD-10-CM

## 2019-05-11 DIAGNOSIS — F411 Generalized anxiety disorder: Secondary | ICD-10-CM

## 2019-05-11 MED ORDER — DULOXETINE HCL 60 MG PO CPEP: 60.0000 mg | ORAL_CAPSULE | Freq: Every day | ORAL | 1 refills | Status: DC

## 2019-05-11 NOTE — Telephone Encounter (Signed)
Sent Cymbalta to pharmacy. 

## 2019-05-11 NOTE — Telephone Encounter (Signed)
received a fax requesting a refill on the duloxetine

## 2019-05-20 ENCOUNTER — Encounter: Payer: Self-pay | Admitting: Licensed Clinical Social Worker

## 2019-05-20 ENCOUNTER — Other Ambulatory Visit: Payer: Self-pay

## 2019-05-20 ENCOUNTER — Ambulatory Visit (INDEPENDENT_AMBULATORY_CARE_PROVIDER_SITE_OTHER): Payer: 59 | Admitting: Licensed Clinical Social Worker

## 2019-05-20 DIAGNOSIS — F411 Generalized anxiety disorder: Secondary | ICD-10-CM

## 2019-05-20 NOTE — Progress Notes (Signed)
  Virtual Visit via Telephone Note  I connected with Maureen Randall on 05/20/19 at  8:00 AM EST by telephone and verified that I am speaking with the correct person using two identifiers.   I discussed the limitations, risks, security and privacy concerns of performing an evaluation and management service by telephone and the availability of in person appointments. I also discussed with the patient that there may be a patient responsible charge related to this service. The patient expressed understanding and agreed to proceed.    I discussed the assessment and treatment plan with the patient. The patient was provided an opportunity to ask questions and all were answered. The patient agreed with the plan and demonstrated an understanding of the instructions.   The patient was advised to call back or seek an in-person evaluation if the symptoms worsen or if the condition fails to improve as anticipated.  I provided 45 minutes of non-face-to-face time during this encounter.   Alden Hipp, LCSW  THERAPIST PROGRESS NOTE  Session Time: 0800  Participation Level: Active  Behavioral Response: NeatAlertAnxious  Type of Therapy: Individual Therapy  Treatment Goals addressed: Anxiety  Interventions: Supportive  Summary: Maureen Randall is a 53 y.o. female who presents with continued symptoms related to her diagnosis. Maureen Randall reports doing well since our last session. She reports her surgery went well, and she has been recovering for the last four weeks. She reports the surgery was not as bad as she thought it would be, and her pain level has been manageable. LCSW validated Maureen Randall's feelings, and encouraged her to utilize this experience as evidence next time she is catastrophizing a situation. Maureen Randall expressed understanding and agreement with this information. Maureen Randall reported she has been forcing herself to take a moment and relax, and she has been able to do that much more easily than she could on a  normal basis. LCSW encouraged Maureen Randall to continue listening to her body, and what it is telling her she needs. Maureen Randall expressed agreement. Maureen Randall reports she has been able to depend on her husband and her daughter for support through this process, which has also not been easy but she has been able to do so. LCSW validated that experience, and recognized it can be very difficult for strong people to give up control or show any form of what could be considered weakness. Maureen Randall expressed agreement with this information as well. We discussed ways to challenge thoughts associated with those ideas and within her interpersonal relationships.   Suicidal/Homicidal: No  Therapist Response: Maureen Randall continues to work towards her tx goals but has not yet reached them. We will continue to work on emotional regulation and distress tolerance skills moving forward.   Plan: Return again in 4 weeks.  Diagnosis: Axis I: Generalized Anxiety Disorder    Axis II: No diagnosis    Alden Hipp, LCSW 05/20/2019

## 2019-06-08 ENCOUNTER — Other Ambulatory Visit: Payer: Self-pay

## 2019-06-08 ENCOUNTER — Encounter: Payer: Self-pay | Admitting: *Deleted

## 2019-06-09 ENCOUNTER — Other Ambulatory Visit
Admission: RE | Admit: 2019-06-09 | Discharge: 2019-06-09 | Disposition: A | Discharge status: Home / Self Care | Payer: 59 | Source: Ambulatory Visit | Attending: Unknown Physician Specialty | Admitting: Unknown Physician Specialty

## 2019-06-09 DIAGNOSIS — Z01812 Encounter for preprocedural laboratory examination: Secondary | ICD-10-CM | POA: Insufficient documentation

## 2019-06-09 DIAGNOSIS — Z20828 Contact with and (suspected) exposure to other viral communicable diseases: Secondary | ICD-10-CM | POA: Insufficient documentation

## 2019-06-09 LAB — SARS CORONAVIRUS 2 (TAT 6-24 HRS): SARS Coronavirus 2: NEGATIVE

## 2019-06-09 NOTE — Anesthesia Preprocedure Evaluation (Addendum)
Anesthesia Evaluation  Patient identified by MRN, date of birth, ID band Patient awake    Reviewed: Allergy & Precautions, NPO status , Patient's Chart, lab work & pertinent test results  History of Anesthesia Complications (+) PONV and history of anesthetic complications  Airway Mallampati: II  TM Distance: >3 FB Neck ROM: Full    Dental   Pulmonary asthma ,    breath sounds clear to auscultation       Cardiovascular (-) angina(-) DOE  Rhythm:Regular Rate:Normal     Neuro/Psych PSYCHIATRIC DISORDERS Anxiety  Neuromuscular disease (Carpal tunnel, lumbar radiculopathy)    GI/Hepatic GERD  ,  Endo/Other  diabetes (Pre-DM)  Renal/GU      Musculoskeletal  (+) Arthritis ,   Abdominal (+) + obese (BMI 34),   Peds  Hematology  DVT 2012, post-op   Anesthesia Other Findings   Reproductive/Obstetrics                            Anesthesia Physical Anesthesia Plan  ASA: II  Anesthesia Plan: General   Post-op Pain Management:    Induction: Intravenous  PONV Risk Score and Plan: 4 or greater and Ondansetron, Dexamethasone, Midazolam, Scopolamine patch - Pre-op and Treatment may vary due to age or medical condition  Airway Management Planned: Oral ETT  Additional Equipment:   Intra-op Plan:   Post-operative Plan: Extubation in OR  Informed Consent: I have reviewed the patients History and Physical, chart, labs and discussed the procedure including the risks, benefits and alternatives for the proposed anesthesia with the patient or authorized representative who has indicated his/her understanding and acceptance.       Plan Discussed with: CRNA and Anesthesiologist  Anesthesia Plan Comments:        Anesthesia Quick Evaluation

## 2019-06-12 ENCOUNTER — Other Ambulatory Visit: Payer: Self-pay

## 2019-06-12 ENCOUNTER — Ambulatory Visit
Admission: RE | Admit: 2019-06-12 | Discharge: 2019-06-12 | Disposition: A | Discharge status: Home / Self Care | Payer: 59 | Attending: Unknown Physician Specialty | Admitting: Unknown Physician Specialty

## 2019-06-12 ENCOUNTER — Ambulatory Visit: Payer: 59 | Admitting: Anesthesiology

## 2019-06-12 ENCOUNTER — Encounter
Admission: RE | Disposition: A | Discharge status: Home / Self Care | Payer: Self-pay | Source: Home / Self Care | Attending: Unknown Physician Specialty

## 2019-06-12 DIAGNOSIS — J3489 Other specified disorders of nose and nasal sinuses: Secondary | ICD-10-CM | POA: Diagnosis present

## 2019-06-12 HISTORY — PX: NASAL TURBINATE REDUCTION: SHX2072

## 2019-06-12 HISTORY — DX: Unspecified asthma, uncomplicated: J45.909

## 2019-06-12 HISTORY — PX: SEPTOPLASTY: SHX2393

## 2019-06-12 SURGERY — SEPTOPLASTY, NOSE
Anesthesia: General | Site: Nose | Laterality: Bilateral

## 2019-06-12 MED ORDER — OXYCODONE HCL 5 MG PO TABS: 5.0000 mg | ORAL_TABLET | Freq: Once | ORAL | Status: AC | PRN

## 2019-06-12 MED ORDER — LIDOCAINE HCL (CARDIAC) PF 100 MG/5ML IV SOSY
PREFILLED_SYRINGE | INTRAVENOUS | Status: DC | PRN
  Administered 2019-06-12: 50 mg via INTRAVENOUS

## 2019-06-12 MED ORDER — OXYCODONE HCL 5 MG/5ML PO SOLN
5.0000 mg | Freq: Once | ORAL | Status: AC | PRN
  Administered 2019-06-12: 5 mg via ORAL

## 2019-06-12 MED ORDER — HYDROCODONE-ACETAMINOPHEN 5-300 MG PO TABS: 1.0000 | ORAL_TABLET | ORAL | 0 refills | Status: DC | PRN

## 2019-06-12 MED ORDER — DEXAMETHASONE SODIUM PHOSPHATE 4 MG/ML IJ SOLN
INTRAMUSCULAR | Status: DC | PRN
  Administered 2019-06-12: 10 mg via INTRAVENOUS

## 2019-06-12 MED ORDER — PROMETHAZINE HCL 25 MG/ML IJ SOLN: 6.2500 mg | INTRAMUSCULAR | Status: DC | PRN

## 2019-06-12 MED ORDER — MIDAZOLAM HCL 5 MG/5ML IJ SOLN
INTRAMUSCULAR | Status: DC | PRN
  Administered 2019-06-12: 2 mg via INTRAVENOUS

## 2019-06-12 MED ORDER — LACTATED RINGERS IV SOLN
100.0000 mL/h | INTRAVENOUS | Status: DC
  Administered 2019-06-12: 100 mL/h via INTRAVENOUS

## 2019-06-12 MED ORDER — PHENYLEPHRINE HCL 0.5 % NA SOLN
NASAL | Status: DC | PRN
  Administered 2019-06-12: 15 mL via TOPICAL

## 2019-06-12 MED ORDER — PROPOFOL 10 MG/ML IV BOLUS
INTRAVENOUS | Status: DC | PRN
  Administered 2019-06-12: 150 mg via INTRAVENOUS

## 2019-06-12 MED ORDER — MEPERIDINE HCL 25 MG/ML IJ SOLN: 6.2500 mg | INTRAMUSCULAR | Status: DC | PRN

## 2019-06-12 MED ORDER — FENTANYL CITRATE (PF) 100 MCG/2ML IJ SOLN
INTRAMUSCULAR | Status: DC | PRN
  Administered 2019-06-12: 100 ug via INTRAVENOUS

## 2019-06-12 MED ORDER — OXYMETAZOLINE HCL 0.05 % NA SOLN
6.0000 | Freq: Once | NASAL | Status: AC
  Administered 2019-06-12: 6 via NASAL

## 2019-06-12 MED ORDER — CLINDAMYCIN HCL 300 MG PO CAPS: 300.0000 mg | ORAL_CAPSULE | Freq: Three times a day (TID) | ORAL | 0 refills | Status: AC

## 2019-06-12 MED ORDER — ONDANSETRON HCL 4 MG/2ML IJ SOLN
INTRAMUSCULAR | Status: DC | PRN
  Administered 2019-06-12: 4 mg via INTRAVENOUS

## 2019-06-12 MED ORDER — LIDOCAINE-EPINEPHRINE 1 %-1:100000 IJ SOLN
INTRAMUSCULAR | Status: DC | PRN
  Administered 2019-06-12: 10 mL

## 2019-06-12 MED ORDER — SUCCINYLCHOLINE CHLORIDE 20 MG/ML IJ SOLN
INTRAMUSCULAR | Status: DC | PRN
  Administered 2019-06-12: 80 mg via INTRAVENOUS

## 2019-06-12 MED ORDER — ACETAMINOPHEN 10 MG/ML IV SOLN
1000.0000 mg | Freq: Once | INTRAVENOUS | Status: AC
  Administered 2019-06-12: 1000 mg via INTRAVENOUS

## 2019-06-12 MED ORDER — SCOPOLAMINE 1 MG/3DAYS TD PT72
1.0000 | MEDICATED_PATCH | TRANSDERMAL | Status: DC
  Administered 2019-06-12: 1.5 mg via TRANSDERMAL

## 2019-06-12 MED ORDER — HYDROMORPHONE HCL 1 MG/ML IJ SOLN
0.2500 mg | INTRAMUSCULAR | Status: DC | PRN
  Administered 2019-06-12: 0.3 mg via INTRAVENOUS
  Administered 2019-06-12: 0.4 mg via INTRAVENOUS
  Administered 2019-06-12: 0.3 mg via INTRAVENOUS

## 2019-06-12 MED ORDER — GLYCOPYRROLATE 0.2 MG/ML IJ SOLN
INTRAMUSCULAR | Status: DC | PRN
  Administered 2019-06-12: 0.1 mg via INTRAVENOUS

## 2019-06-12 SURGICAL SUPPLY — 23 items
COAG SUCT 10F 3.5MM HAND CTRL (MISCELLANEOUS) ×3 IMPLANT
DRAPE HEAD BAR (DRAPES) ×3 IMPLANT
DRESSING NASL FOAM PST OP SINU (MISCELLANEOUS) IMPLANT
DRSG NASAL FOAM POST OP SINU (MISCELLANEOUS) ×6
ELECT REM PT RETURN 9FT ADLT (ELECTROSURGICAL) ×3
ELECTRODE REM PT RTRN 9FT ADLT (ELECTROSURGICAL) ×1 IMPLANT
GLOVE BIO SURGEON STRL SZ7.5 (GLOVE) ×6 IMPLANT
HANDLE YANKAUER SUCT BULB TIP (MISCELLANEOUS) ×3 IMPLANT
KIT TURNOVER KIT A (KITS) ×3 IMPLANT
NDL HYPO 25GX1X1/2 BEV (NEEDLE) ×1 IMPLANT
NEEDLE HYPO 25GX1X1/2 BEV (NEEDLE) ×3 IMPLANT
PACK ENT CUSTOM (PACKS) ×3 IMPLANT
SPLINT NASAL SEPTAL BLV .50 ST (MISCELLANEOUS) ×2 IMPLANT
SPONGE NEURO XRAY DETECT 1X3 (DISPOSABLE) ×3 IMPLANT
STRAP BODY AND KNEE 60X3 (MISCELLANEOUS) ×3 IMPLANT
SUT CHROMIC 3-0 (SUTURE) ×2
SUT CHROMIC 3-0 KS 27XMFL CR (SUTURE) ×1
SUT ETHILON 3-0 KS 30 BLK (SUTURE) ×3 IMPLANT
SUT PLAIN GUT 4-0 (SUTURE) ×2 IMPLANT
SUTURE CHRMC 3-0 KS 27XMFL CR (SUTURE) ×1 IMPLANT
SYR 10ML LL (SYRINGE) ×3 IMPLANT
TOWEL OR 17X26 4PK STRL BLUE (TOWEL DISPOSABLE) ×3 IMPLANT
WATER STERILE IRR 250ML POUR (IV SOLUTION) ×3 IMPLANT

## 2019-06-12 NOTE — Op Note (Signed)
PREOPERATIVE DIAGNOSIS:  Chronic nasal obstruction.  POSTOPERATIVE DIAGNOSIS:  Chronic nasal obstruction.  SURGEON:  Roena Malady, M.D.  NAME OF PROCEDURE:  1. Nasal septoplasty. 2. Submucous resection of inferior turbinates.  OPERATIVE FINDINGS:  Severe nasal septal deformity, hypertrophy of the inferior turbinates.   DESCRIPTION OF THE PROCEDURE:  Maureen Randall was identified in the holding area and taken to the operating room and placed in the supine position.  After general endotracheal anesthesia was induced, the table was turned 45 degrees and the patient was placed in a semi-Fowler position.  The nose was then topically anesthetized with Lidocaine, cotton pledgets were placed within each nostril. After approximately 5 minutes, this was removed at which time a local anesthetic of 1% Lidocaine 1:100,000 units of Epinephrine was used to inject the inferior turbinates in the nasal septum. A total of 10 ml was used. Examination of the nose showed a severe left nasal septal deformity and tremendous hypertrophied inferior turbinate.  Beginning on the right hand side a hemitransfixion incision was then created on the leading edge of the septum on the right.  A subperichondrial plane was elevated posteriorly on the left and taken back to the perpendicular plate of the ethmoid where subperiosteal plane was elevated posteriorly on the left. A large septal spur was identified on the left hand side impacting on the inferior turbinate.  An inferior rim of cartilage was removed anteriorly with care taken to leave an anterior strut to prevent nasal collapse. With this strut removed the perpendicular plate of the ethmoid was separated from the quadrangular cartilage. The large septal spur was removed.  The septum was then replaced in the midline. Reinspection through each nostril showed excellent reduction of the septal deformity. A left posterior inferior fenestration was then created to allow hematoma  drainage.  With the septoplasty completed, beginning on the left-hand side, a 15 blade was used to incise along the inferior edge of the inferior turbinate. A superior laterally based flap was then elevated. The underlying conchal bone of mucosa was excised using Knight scissors. The flap was then laid back over the turbinate stump and cauterized using suction cautery. In a similar fashion the submucous resection was performed on the right.  With the submucous resection completed bilaterally and no active bleeding, the hemitransfixion incision was then closed using two interrupted 3-0 chromic sutures.  Plastic nasal septal splints were placed within each nostril and affixed to the septum using a 3-0 nylon suture. Stammberger was then used beneath each inferior turbinate for hemostasis.    The patient tolerated the procedure well, was returned to anesthesia, extubated in the operating room, and taken to the recovery room in stable condition.    CULTURES:  None.  SPECIMENS:  None.  ESTIMATED BLOOD LOSS:  25 cc.  Roena Malady  06/12/2019  10:06 AM

## 2019-06-12 NOTE — Anesthesia Procedure Notes (Signed)
Procedure Name: Intubation Date/Time: 06/12/2019 9:32 AM Performed by: Mayme Genta, CRNA Pre-anesthesia Checklist: Patient identified, Emergency Drugs available, Suction available, Patient being monitored and Timeout performed Patient Re-evaluated:Patient Re-evaluated prior to induction Oxygen Delivery Method: Circle system utilized Preoxygenation: Pre-oxygenation with 100% oxygen Induction Type: IV induction Ventilation: Mask ventilation without difficulty Laryngoscope Size: Miller and 2 Grade View: Grade I Tube type: Oral Rae Tube size: 7.0 mm Number of attempts: 1 Placement Confirmation: ETT inserted through vocal cords under direct vision,  positive ETCO2 and breath sounds checked- equal and bilateral Tube secured with: Tape Dental Injury: Teeth and Oropharynx as per pre-operative assessment

## 2019-06-12 NOTE — Transfer of Care (Signed)
Immediate Anesthesia Transfer of Care Note  Patient: [name]  Procedure(s) Performed: SEPTOPLASTY (Bilateral Nose) TURBINATE REDUCTION/SUBMUCOSAL RESECTION (Bilateral Nose)  Patient Location: PACU  Anesthesia Type: General  Level of Consciousness: awake, alert  and patient cooperative  Airway and Oxygen Therapy: Patient Spontanous Breathing and Patient connected to supplemental oxygen  Post-op Assessment: Post-op Vital signs reviewed, Patient's Cardiovascular Status Stable, Respiratory Function Stable, Patent Airway and No signs of Nausea or vomiting  Post-op Vital Signs: Reviewed and stable  Complications: No apparent anesthesia complications

## 2019-06-12 NOTE — Anesthesia Postprocedure Evaluation (Signed)
Anesthesia Post Note  Patient: Maureen Randall  Procedure(s) Performed: SEPTOPLASTY (Bilateral Nose) TURBINATE REDUCTION/SUBMUCOSAL RESECTION (Bilateral Nose)     Patient location during evaluation: PACU Anesthesia Type: General Level of consciousness: awake and alert Pain management: pain level controlled Vital Signs Assessment: post-procedure vital signs reviewed and stable Respiratory status: spontaneous breathing, nonlabored ventilation, respiratory function stable and patient connected to nasal cannula oxygen Cardiovascular status: blood pressure returned to baseline and stable Postop Assessment: no apparent nausea or vomiting Anesthetic complications: no    Maureen Randall  Maureen Randall

## 2019-06-12 NOTE — H&P (Signed)
The patient's history has been reviewed, patient examined, no change in status, stable for surgery.  Questions were answered to the patients satisfaction.  

## 2019-06-12 NOTE — Discharge Instructions (Signed)
Scopolamine skin patches What is this medicine? SCOPOLAMINE (skoe POL a meen) is used to prevent nausea and vomiting caused by motion sickness, anesthesia and surgery. This medicine may be used for other purposes; ask your health care provider or pharmacist if you have questions. COMMON BRAND NAME(S): Transderm Scop What should I tell my health care provider before I take this medicine? They need to know if you have any of these conditions:  are scheduled to have a gastric secretion test  glaucoma  heart disease  kidney disease  liver disease  lung or breathing disease, like asthma  mental illness  prostate disease  seizures  stomach or intestine problems  trouble passing urine  an unusual or allergic reaction to scopolamine, atropine, other medicines, foods, dyes, or preservatives  pregnant or trying to get pregnant  breast-feeding How should I use this medicine? This medicine is for external use only. Follow the directions on the prescription label. Wear only 1 patch at a time. Choose an area behind the ear, that is clean, dry, hairless and free from any cuts or irritation. Wipe the area with a clean dry tissue. Peel off the plastic backing of the skin patch, trying not to touch the adhesive side with your hands. Do not cut the patches. Firmly apply to the area you have chosen, with the metallic side of the patch to the skin and the tan-colored side showing. Once firmly in place, wash your hands well with soap and water. Do not get this medicine into your eyes. After removing the patch, wash your hands and the area behind your ear thoroughly with soap and water. The patch will still contain some medicine after use. To avoid accidental contact or ingestion by children or pets, fold the used patch in half with the sticky side together and throw away in the trash out of the reach of children and pets. If you need to use a second patch after you remove the first, place it behind  the other ear. A special MedGuide will be given to you by the pharmacist with each prescription and refill. Be sure to read this information carefully each time. Talk to your pediatrician regarding the use of this medicine in children. Special care may be needed. Overdosage: If you think you have taken too much of this medicine contact a poison control center or emergency room at once. NOTE: This medicine is only for you. Do not share this medicine with others. What if I miss a dose? This does not apply. This medicine is not for regular use. What may interact with this medicine?  alcohol  antihistamines for allergy cough and cold  atropine  certain medicines for anxiety or sleep  certain medicines for bladder problems like oxybutynin, tolterodine  certain medicines for depression like amitriptyline, fluoxetine, sertraline  certain medicines for stomach problems like dicyclomine, hyoscyamine  certain medicines for Parkinson's disease like benztropine, trihexyphenidyl  certain medicines for seizures like phenobarbital, primidone  general anesthetics like halothane, isoflurane, methoxyflurane, propofol  ipratropium  local anesthetics like lidocaine, pramoxine, tetracaine  medicines that relax muscles for surgery  phenothiazines like chlorpromazine, mesoridazine, prochlorperazine, thioridazine  narcotic medicines for pain  other belladonna alkaloids This list may not describe all possible interactions. Give your health care provider a list of all the medicines, herbs, non-prescription drugs, or dietary supplements you use. Also tell them if you smoke, drink alcohol, or use illegal drugs. Some items may interact with your medicine. What should I watch for while using  this medicine? Limit contact with water while swimming and bathing because the patch may fall off. If the patch falls off, throw it away and put a new one behind the other ear. You may get drowsy or dizzy. Do not  drive, use machinery, or do anything that needs mental alertness until you know how this medicine affects you. Do not stand or sit up quickly, especially if you are an older patient. This reduces the risk of dizzy or fainting spells. Alcohol may interfere with the effect of this medicine. Avoid alcoholic drinks. Your mouth may get dry. Chewing sugarless gum or sucking hard candy, and drinking plenty of water may help. Contact your healthcare professional if the problem does not go away or is severe. This medicine may cause dry eyes and blurred vision. If you wear contact lenses, you may feel some discomfort. Lubricating drops may help. See your healthcare professional if the problem does not go away or is severe. If you are going to need surgery, an MRI, CT scan, or other procedure, tell your healthcare professional that you are using this medicine. You may need to remove the patch before the procedure. What side effects may I notice from receiving this medicine? Side effects that you should report to your doctor or health care professional as soon as possible:  allergic reactions like skin rash, itching or hives; swelling of the face, lips, or tongue  blurred vision  changes in vision  confusion  dizziness  eye pain  fast, irregular heartbeat  hallucinations, loss of contact with reality  nausea, vomiting  pain or trouble passing urine  restlessness  seizures  skin irritation  stomach pain Side effects that usually do not require medical attention (report to your doctor or health care professional if they continue or are bothersome):  drowsiness  dry mouth  headache  sore throat This list may not describe all possible side effects. Call your doctor for medical advice about side effects. You may report side effects to FDA at 1-800-FDA-1088. Where should I keep my medicine? Keep out of the reach of children. Store at room temperature between 20 and 25 degrees C (68 and 77  degrees F). Keep this medicine in the foil package until ready to use. Throw away any unused medicine after the expiration date. NOTE: This sheet is a summary. It may not cover all possible information. If you have questions about this medicine, talk to your doctor, pharmacist, or health care provider.  2020 Elsevier/Gold Standard (2017-09-13 16:14:46)   [facility]  What is Functional Endoscopic Sinus Surgery?  The Surgery involves making the natural openings of the sinuses larger by removing the bony partitions that separate the sinuses from the nasal cavity.  The natural sinus lining is preserved as much as possible to allow the sinuses to resume normal function after the surgery.  In some patients nasal polyps (excessively swollen lining of the sinuses) may be removed to relieve obstruction of the sinus openings.  The surgery is performed through the nose using lighted scopes, which eliminates the need for incisions on the face.  A septoplasty is a different procedure which is sometimes performed with sinus surgery.  It involves straightening the boy partition that separates the two sides of your nose.  A crooked or deviated septum may need repair if is obstructing the sinuses or nasal airflow.  Turbinate reduction is also often performed during sinus surgery.  The turbinates are bony proturberances from the side walls of the nose which swell and can obstruct the nose in patients with sinus and allergy problems.  Their size can be surgically reduced to help relieve nasal obstruction.  What Can Sinus Surgery Do For Me?  Sinus surgery can reduce the frequency of sinus infections requiring antibiotic treatment.  This can provide improvement in nasal congestion, post-nasal drainage, facial pressure and nasal obstruction.  Surgery will NOT prevent you from ever having an infection again, so it  usually only for patients who get infections 4 or more times yearly requiring antibiotics, or for infections that do not clear with antibiotics.  It will not cure nasal allergies, so patients with allergies may still require medication to treat their allergies after surgery. Surgery may improve headaches related to sinusitis, however, some people will continue to require medication to control sinus headaches related to allergies.  Surgery will do nothing for other forms of headache (migraine, tension or cluster).  What Are the Risks of Endoscopic Sinus Surgery?  Current techniques allow surgery to be performed safely with little risk, however, there are rare complications that patients should be aware of.  Because the sinuses are located around the eyes, there is risk of eye injury, including blindness, though again, this would be quite rare. This is usually a result of bleeding behind the eye during surgery, which puts the vision oat risk, though there are treatments to protect the vision and prevent permanent disrupted by surgery causing a leak of the spinal fluid that surrounds the brain.  More serious complications would include bleeding inside the brain cavity or damage to the brain.  Again, all of these complications are uncommon, and spinal fluid leaks can be safely managed surgically if they occur.  The most common complication of sinus surgery is bleeding from the nose, which may require packing or cauterization of the nose.  Continued sinus have polyps may experience recurrence of the polyps requiring revision surgery.  Alterations of sense of smell or injury to the tear ducts are also rare complications.   What is the Surgery Like, and what is the Recovery?  The Surgery usually takes a couple of hours to perform, and is usually performed under a general anesthetic (completely asleep).  Patients are usually discharged home after a couple of hours.  Sometimes during surgery it is necessary to pack the  nose to control bleeding, and the packing is left in place for 24 - 48 hours, and removed by your surgeon.  If a septoplasty was performed during the procedure, there is often a splint placed which must be removed after 5-7 days.   Discomfort: Pain is usually mild to moderate, and can be controlled by prescription pain medication or acetaminophen (Tylenol).  Aspirin, Ibuprofen (Advil, Motrin), or Naprosyn (Aleve) should be avoided, as they can cause increased bleeding.  Most patients feel sinus pressure like they have a bad head cold for several days.  Sleeping with your head elevated can help reduce swelling and facial pressure, as can ice packs over the face.  A humidifier may be helpful to keep the mucous and blood from drying in the nose.   Diet: There are no specific diet restrictions, however, you should generally start with clear liquids and a light diet of bland foods because the anesthetic can cause some nausea.  Advance your diet depending on how your stomach feels.  Taking your pain medication with food will often help reduce stomach upset which pain  medications can cause.  Nasal Saline Irrigation: It is important to remove blood clots and dried mucous from the nose as it is healing.  This is done by having you irrigate the nose at least 3 - 4 times daily with a salt water solution.  We recommend using NeilMed Sinus Rinse (available at the drug store).  Fill the squeeze bottle with the solution, bend over a sink, and insert the tip of the squeeze bottle into the nose  of an inch.  Point the tip of the squeeze bottle towards the inside corner of the eye on the same side your irrigating.  Squeeze the bottle and gently irrigate the nose.  If you bend forward as you do this, most of the fluid will flow back out of the nose, instead of down your throat.   The solution should be warm, near body temperature, when you irrigate.   Each time you irrigate, you should use a full squeeze bottle.   Note that if  you are instructed to use Nasal Steroid Sprays at any time after your surgery, irrigate with saline BEFORE using the steroid spray, so you do not wash it all out of the nose. Another product, Nasal Saline Gel (such as AYR Nasal Saline Gel) can be applied in each nostril 3 - 4 times daily to moisture the nose and reduce scabbing or crusting.  Bleeding:  Bloody drainage from the nose can be expected for several days, and patients are instructed to irrigate their nose frequently with salt water to help remove mucous and blood clots.  The drainage may be dark red or brown, though some fresh blood may be seen intermittently, especially after irrigation.  Do not blow you nose, as bleeding may occur. If you must sneeze, keep your mouth open to allow air to escape through your mouth.  If heavy bleeding occurs: Irrigate the nose with saline to rinse out clots, then spray the nose 3 - 4 times with Afrin Nasal Decongestant Spray.  The spray will constrict the blood vessels to slow bleeding.  Pinch the lower half of your nose shut to apply pressure, and lay down with your head elevated.  Ice packs over the nose may help as well. If bleeding persists despite these measures, you should notify your doctor.  Do not use the Afrin routinely to control nasal congestion after surgery, as it can result in worsening congestion and may affect healing.     Activity: Return to work varies among patients. Most patients will be out of work at least 5 - 7 days to recover.  Patient may return to work after they are off of narcotic pain medication, and feeling well enough to perform the functions of their job.  Patients must avoid heavy lifting (over 10 pounds) or strenuous physical for 2 weeks after surgery, so your employer may need to assign you to light duty, or keep you out of work longer if light duty is not possible.  NOTE: you should not drive, operate dangerous machinery, do any mentally demanding tasks or make any important  legal or financial decisions while on narcotic pain medication and recovering from the general anesthetic.    Call Your Doctor Immediately if You Have Any of the Following: 1. Bleeding that you cannot control with the above measures 2. Loss of vision, double vision, bulging of the eye or black eyes. 3. Fever over 101 degrees 4. Neck stiffness with severe headache, fever, nausea and change in mental state. You are  always encourage to call anytime with concerns, however, please call with requests for pain medication refills during office hours.  Office Endoscopy: During follow-up visits your doctor will remove any packing or splints that may have been placed and evaluate and clean your sinuses endoscopically.  Topical anesthetic will be used to make this as comfortable as possible, though you may want to take your pain medication prior to the visit.  How often this will need to be done varies from patient to patient.  After complete recovery from the surgery, you may need follow-up endoscopy from time to time, particularly if there is concern of recurrent infection or nasal polyps.   General Anesthesia, Adult, Care After This sheet gives you information about how to care for yourself after your procedure. Your health care provider may also give you more specific instructions. If you have problems or questions, contact your health care provider. What can I expect after the procedure? After the procedure, the following side effects are common:  Pain or discomfort at the IV site.  Nausea.  Vomiting.  Sore throat.  Trouble concentrating.  Feeling cold or chills.  Weak or tired.  Sleepiness and fatigue.  Soreness and body aches. These side effects can affect parts of the body that were not involved in surgery. Follow these instructions at home:  For at least 24 hours after the procedure:  Have a responsible adult stay with you. It is important to have someone help care for you until  you are awake and alert.  Rest as needed.  Do not: ? Participate in activities in which you could fall or become injured. ? Drive. ? Use heavy machinery. ? Drink alcohol. ? Take sleeping pills or medicines that cause drowsiness. ? Make important decisions or sign legal documents. ? Take care of children on your own. Eating and drinking  Follow any instructions from your health care provider about eating or drinking restrictions.  When you feel hungry, start by eating small amounts of foods that are soft and easy to digest (bland), such as toast. Gradually return to your regular diet.  Drink enough fluid to keep your urine pale yellow.  If you vomit, rehydrate by drinking water, juice, or clear broth. General instructions  If you have sleep apnea, surgery and certain medicines can increase your risk for breathing problems. Follow instructions from your health care provider about wearing your sleep device: ? Anytime you are sleeping, including during daytime naps. ? While taking prescription pain medicines, sleeping medicines, or medicines that make you drowsy.  Return to your normal activities as told by your health care provider. Ask your health care provider what activities are safe for you.  Take over-the-counter and prescription medicines only as told by your health care provider.  If you smoke, do not smoke without supervision.  Keep all follow-up visits as told by your health care provider. This is important. Contact a health care provider if:  You have nausea or vomiting that does not get better with medicine.  You cannot eat or drink without vomiting.  You have pain that does not get better with medicine.  You are unable to pass urine.  You develop a skin rash.  You have a fever.  You have redness around your IV site that gets worse. Get help right away if:  You have difficulty breathing.  You have chest pain.  You have blood in your urine or stool, or  you vomit blood. Summary  After the procedure, it  is common to have a sore throat or nausea. It is also common to feel tired.  Have a responsible adult stay with you for the first 24 hours after general anesthesia. It is important to have someone help care for you until you are awake and alert.  When you feel hungry, start by eating small amounts of foods that are soft and easy to digest (bland), such as toast. Gradually return to your regular diet.  Drink enough fluid to keep your urine pale yellow.  Return to your normal activities as told by your health care provider. Ask your health care provider what activities are safe for you. This information is not intended to replace advice given to you by your health care provider. Make sure you discuss any questions you have with your health care provider. Document Released: 10/01/2000 Document Revised: 06/28/2017 Document Reviewed: 02/08/2017 Elsevier Patient Education  2020 [employer].

## 2019-06-15 ENCOUNTER — Encounter: Payer: Self-pay | Admitting: Unknown Physician Specialty

## 2019-06-22 ENCOUNTER — Encounter: Payer: Self-pay | Admitting: Licensed Clinical Social Worker

## 2019-06-22 ENCOUNTER — Ambulatory Visit (INDEPENDENT_AMBULATORY_CARE_PROVIDER_SITE_OTHER): Payer: 59 | Admitting: Psychiatry

## 2019-06-22 ENCOUNTER — Ambulatory Visit (INDEPENDENT_AMBULATORY_CARE_PROVIDER_SITE_OTHER): Payer: 59 | Admitting: Licensed Clinical Social Worker

## 2019-06-22 ENCOUNTER — Encounter: Payer: Self-pay | Admitting: Psychiatry

## 2019-06-22 ENCOUNTER — Other Ambulatory Visit: Payer: Self-pay

## 2019-06-22 DIAGNOSIS — F411 Generalized anxiety disorder: Secondary | ICD-10-CM | POA: Diagnosis not present

## 2019-06-22 DIAGNOSIS — G4701 Insomnia due to medical condition: Secondary | ICD-10-CM | POA: Diagnosis not present

## 2019-06-22 MED ORDER — BUSPIRONE HCL 10 MG PO TABS: 10.0000 mg | ORAL_TABLET | Freq: Two times a day (BID) | ORAL | 0 refills | Status: DC

## 2019-06-22 NOTE — Progress Notes (Signed)
Virtual Visit via Video Note  I connected with Maureen Randall on 06/22/19 at 10:00 AM EST by a video enabled telemedicine application and verified that I am speaking with the correct person using two identifiers.   I discussed the limitations of evaluation and management by telemedicine and the availability of in person appointments. The patient expressed understanding and agreed to proceed.      I discussed the assessment and treatment plan with the patient. The patient was provided an opportunity to ask questions and all were answered. The patient agreed with the plan and demonstrated an understanding of the instructions.   The patient was advised to call back or seek an in-person evaluation if the symptoms worsen or if the condition fails to improve as anticipated.   [place] [facility] MD OP Progress Note  06/22/2019 12:02 PM BRIYANA SEEBURGER  MRN:  [id number]  Chief Complaint:  Chief Complaint    Follow-up     HPI: Maureen Randall is a 53 year old Caucasian female, has a history of GAD, insomnia, chronic pain, primary osteoarthritis, lumbar radiculopathy, degenerative disc disease, spondylolisthesis, bilateral carpal tunnel syndrome, recent surgery-of her nose, was evaluated by telemedicine today.  Patient today reports she continues to struggle with pain which can be overwhelming.  It does affect her anxiety however she has been coping okay.  She had her appointment with her therapist this morning and that went well.  She reports sleep is affected due to pain.  She however is scheduled to get an SI joint injection soon and is hopeful that it will help with her pain.  She reports she is compliant on the Cymbalta and the trazodone and the BuSpar.  She reports she is tolerating it well.  She does not take the hydroxyzine and has been avoiding it.  She reports she was recently started on gabapentin for pain however takes only 1 dosage at night since it has caused urinary retention at higher dosages in the  past.  Patient denies any suicidality, homicidality or perceptual disturbances.  Patient denies any other concerns today. Visit Diagnosis:    ICD-10-CM   1. GAD (generalized anxiety disorder)  F41.1 busPIRone (BUSPAR) 10 MG tablet  2. 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  Past trials of Xanax, diazepam, clonazepam, Pamelor.  Past Medical History:  Past Medical History:  Diagnosis Date  . Anxiety   . Arthritis    osteoarthritis  . Asthma   . DVT (deep venous thrombosis) ([place]) 2012   right leg. after surgery, while on BCP  . GERD (gastroesophageal reflux disease)     Past Surgical History:  Procedure Laterality Date  . ABLATION     veins in lower legs  . BREAST BIOPSY Left 2011   neg  . CARPAL TUNNEL RELEASE Right 12/2015   [facility]  . CARPAL TUNNEL RELEASE Left 12/2016  . FOOT SURGERY Right 2015   broken navicular bone  . HIP ARTHROSCOPY Right 05/2011  . HIP SURGERY     left hip surgery 05/2018  . NASAL TURBINATE REDUCTION Bilateral 06/12/2019   Procedure: TURBINATE REDUCTION/SUBMUCOSAL RESECTION;  Surgeon: [name], MD;  Location: [facility];  Service: ENT;  Laterality: Bilateral;  . REPLACEMENT TOTAL HIP W/  RESURFACING IMPLANTS Right 12/2017   [facility]  . SEPTOPLASTY Bilateral 06/12/2019   Procedure: SEPTOPLASTY;  Surgeon: [name], MD;  Location: [facility];  Service: ENT;  Laterality: Bilateral;  . SHOULDER SURGERY Right 12/2015   [facility] torn rotator cuff  . SPINAL FUSION  2016   [facility] lumbar    Family Psychiatric History: I have reviewed family psychiatric history from my progress note on 04/30/2018.  Family History:  Family History  Problem Relation Age of Onset  . Breast cancer Maternal Aunt   . Breast cancer Paternal Aunt   . Dementia Paternal Aunt   . COPD Mother        quit smoking  after 60 years  . Dementia Father     Social History: Reviewed social history from my progress note on 04/30/2018. Social History   Socioeconomic History  . Marital status: Married    Spouse name: fredrick  . Number of children: 1  . Years of education: Not on file  . Highest education level: High school graduate  Occupational History    Comment: full time  Tobacco Use  . Smoking status: Never Smoker  . Smokeless tobacco: Never Used  Substance and Sexual Activity  . Alcohol use: Not Currently    Alcohol/week: 1.0 standard drinks    Types: 1 Cans of beer per week    Comment: may have drink 1x/month  . Drug use: Never  . Sexual activity: Yes  Other Topics Concern  . 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 Difficulty of Paying Living Expenses: Not on file  Food Insecurity:   . Worried About Charity fundraiser in the Last Year: Not on file  . Ran Out of Food in the Last Year: Not on file  Transportation Needs:   . Lack of Transportation (Medical): Not on file  . Lack of Transportation (Non-Medical): Not on file  Physical Activity:   . Days of Exercise per Week: Not on file  . Minutes of Exercise per Session: Not on file  Stress:   . Feeling of Stress : Not on file  Social Connections:   . Frequency of Communication with Friends and Family: Not on file  . Frequency of Social Gatherings with Friends and Family: Not on file  . Attends Religious Services: Not on file  . Active Member of Clubs or Organizations: Not on file  . Attends Archivist Meetings: Not on file  . Marital Status: Not on file    Allergies:  Allergies  Allergen Reactions  . Amoxicillin Hives  . Septra [Sulfamethoxazole-Trimethoprim] Hives       . Cephalexin Rash         Metabolic Disorder Labs: Lab Results  Component Value Date   HGBA1C 5.7  01/05/2019   No results found for: PROLACTIN Lab Results  Component Value Date   CHOL 250 (H) 01/05/2019   TRIG 93.0 01/05/2019   HDL 75.90 01/05/2019   CHOLHDL 3 01/05/2019   VLDL 18.6 01/05/2019   LDLCALC 156 (H) 01/05/2019   Lab Results  Component Value Date   TSH 3.78 01/05/2019    Therapeutic Level Labs: No results found for: LITHIUM No results found for: VALPROATE No components found for:  CBMZ  Current Medications: Current Outpatient Medications  Medication Sig Dispense Refill  . albuterol (VENTOLIN HFA) 108 (90 Base) MCG/ACT inhaler Inhale 2 puffs into the lungs every 6 (six) hours as needed for wheezing or shortness of breath. [place]  g 2  . azelastine (ASTELIN) 0.1 % nasal spray azelastine 137 mcg (0.1 %) nasal spray aerosol  INSTILL 1 SPRAY IEN D FOR ALLERGIES    . baclofen (LIORESAL) 10 MG tablet Take 10 mg by mouth 3 (three) times daily as needed for muscle spasms.    . budesonide-formoterol (SYMBICORT) 160-4.5 MCG/ACT inhaler 2 puffs twice daily. 1 Inhaler 5  . busPIRone (BUSPAR) 10 MG tablet Take 1 tablet (10 mg total) by mouth 2 (two) times daily. 180 tablet 0  . calcium carbonate (OS-CAL - DOSED IN MG OF ELEMENTAL CALCIUM) 1250 (500 Ca) MG tablet Take by mouth.    . Cholecalciferol (VITAMIN D3) 25 MCG (1000 UT) CAPS Take by mouth.    . clindamycin (CLEOCIN) 300 MG capsule Take 1 capsule (300 mg total) by mouth 3 (three) times daily for 10 days. 30 capsule 0  . Cyanocobalamin (B-12 COMPLIANCE INJECTION IJ) Inject as directed every 3 (three) months.    . cyclobenzaprine (FLEXERIL) 10 MG tablet cyclobenzaprine 10 mg tablet  TK 1 T PO Q 6-8 H PRF SPASM    . diclofenac sodium (VOLTAREN) 1 % GEL   11  . DULoxetine (CYMBALTA) 60 MG capsule Take 1 capsule (60 mg total) by mouth daily. 90 capsule 1  . Elastic Bandages & Supports (RELIEF KNEE) MISC Apply in the morning and remove at night.    . estradiol (ESTRACE) 0.1 MG/GM vaginal cream INSERT PEA SIZED AMOUNT (0.5G) INTO  VAGINA NIGHTLY EVERY OTHER NIGHT FOR 2 WEEKS, THEN TWICE WEEKLY    . fluconazole (DIFLUCAN) 200 MG tablet Take 200 mg by mouth daily.    . fluticasone (FLONASE) 50 MCG/ACT nasal spray     . gabapentin (NEURONTIN) 300 MG capsule Take 300 mg by mouth 2 (two) times daily.    [name] HYDROcodone-Acetaminophen 5-300 MG TABS Take 1-2 tablets by mouth every 4 (four) hours as needed. 40 tablet 0  . hydrOXYzine (VISTARIL) 25 MG capsule Take 1-2 capsules (25-50 mg total) by mouth daily as needed for anxiety. For severe anxiety attacks only 60 capsule 1  . ibuprofen (ADVIL,MOTRIN) 800 MG tablet TK 1 T PO Q 8 H WF PRN P  2  . methocarbamol (ROBAXIN) 500 MG tablet Take 1 tablet (500 mg total) by mouth 2 (two) times daily as needed for muscle spasms. 60 tablet 2  . Multiple Vitamin (MULTI-VITAMINS) TABS Take by mouth.    . nitrofurantoin (MACRODANTIN) 50 MG capsule Take 1 capsule (50 mg total) by mouth as needed. UTI dose should be 100 mg bid x 5 days UTI  or 50-100 mg qhs prevention 120 capsule 3  . omeprazole (PRILOSEC) 20 MG capsule Take 1 capsule (20 mg total) by mouth daily. 30 capsule 0  . oxyCODONE-acetaminophen (PERCOCET) 10-325 MG tablet Take 1 tablet by mouth every 6 (six) hours as needed.    [name] oxyCODONE-acetaminophen (PERCOCET) 7.5-325 MG tablet Take 1 tablet by mouth every 6 (six) hours as needed for severe pain. Dr. Alvira Monday pain clinic [facility]    . phentermine 37.5 MG capsule TK 1 C PO QD    . traZODone (DESYREL) 100 MG tablet Take 1-1.5 tablets (100-150 mg total) by mouth at bedtime as needed for sleep. 135 tablet 1  . valACYclovir (VALTREX) 1000 MG tablet Oral blister    . VIMOVO 500-20 MG TBEC Take 1 tablet by mouth 2 (two) times daily.  2   No current facility-administered medications for this visit.     Musculoskeletal: Strength &  Muscle Tone: UTA Gait & Station: normal Patient leans: N/A  Psychiatric Specialty Exam: Review of Systems  Musculoskeletal: Positive for back pain and  myalgias.  Psychiatric/Behavioral: Positive for sleep disturbance. The patient is nervous/anxious.   All other systems reviewed and are negative.   There were no vitals taken for this visit.There is no height or weight on file to calculate BMI.  General Appearance: Casual  Eye Contact:  Fair  Speech:  Clear and Coherent  Volume:  Normal  Mood:  Anxious Due to pain  Affect:  Congruent  Thought Process:  Goal Directed and Descriptions of Associations: Intact  Orientation:  Full (Time, Place, and Person)  Thought Content: Logical   Suicidal Thoughts:  No  Homicidal Thoughts:  No  Memory:  Immediate;   Fair Recent;   Fair Remote;   Fair  Judgement:  Fair  Insight:  Fair  Psychomotor Activity:  Normal  Concentration:  Concentration: Fair and Attention Span: Fair  Recall:  [employer] of Knowledge: Fair  Language: Fair  Akathisia:  No  Handed:  Right  AIMS (if indicated): denies tremors, rigidity  Assets:  Communication Skills Desire for Improvement Housing Social Support Transportation  ADL's:  Intact  Cognition: WNL  Sleep:  restless due to pain   Screenings: PHQ2-9     Office Visit from 03/13/2019 in [facility] from 02/11/2018 in [facility] Office Visit from 01/29/2018 in [facility]  PHQ-2 Total Score  0  0  0  PHQ-9 Total Score  0  --  --       Assessment and Plan: Maureen Randall is a 53 year old Caucasian female, married, employed, currently on long-term disability, lives in [place], has a history of GAD, insomnia, chronic pain, osteoarthritis, degenerative spondylolisthesis, lumbar radiculopathy, bilateral carpal tunnel syndrome, multiple surgeries was evaluated by telemedicine today.  Patient with psychosocial stressors of health issues including recent surgery as well as chronic pain.  Patient will continue to benefit from medications as well as  psychotherapy sessions although her current anxiety symptoms are due to pain.  Plan as noted below.  Plan GAD-improving Cymbalta 60 mg p.o. daily.  She developed side effects to higher dosages-cognitive changes. BuSpar 10 mg p.o. twice daily Hydroxyzine 25 to 50 mg p.o. daily as needed for anxiety attacks that she does not use it. Continue psychotherapy sessions with therapist Ms. Alden Hipp.  Insomnia-improving Trazodone 100 to 150 mg p.o. nightly as needed She will also need pain management since her sleep problems are more so because of her pain.  Follow-up in clinic in 2 to 3 months or sooner if needed.  February 22 at 10 AM  I have spent atleast 15 minutes non face to face with patient today. More than 50 % of the time was spent for psychoeducation and supportive psychotherapy and care coordination. This note was generated in part or whole with voice recognition software. Voice recognition is usually quite accurate but there are transcription errors that can and very often do occur. I apologize for any typographical errors that were not detected and corrected.       Ursula Alert, MD 06/22/2019, 12:02 PM

## 2019-06-22 NOTE — Progress Notes (Signed)
Virtual Visit via Video Note  I connected with Maureen Randall on 06/22/19 at  9:00 AM EST by a video enabled telemedicine application and verified that I am speaking with the correct person using two identifiers.   I discussed the limitations of evaluation and management by telemedicine and the availability of in person appointments. The patient expressed understanding and agreed to proceed.  I discussed the assessment and treatment plan with the patient. The patient was provided an opportunity to ask questions and all were answered. The patient agreed with the plan and demonstrated an understanding of the instructions.   The patient was advised to call back or seek an in-person evaluation if the symptoms worsen or if the condition fails to improve as anticipated.  I provided 45 minutes of non-face-to-face time during this encounter.   Alden Hipp, LCSW    THERAPIST PROGRESS NOTE  Session Time: 0900  Participation Level: Active  Behavioral Response: NeatAlertAnxious  Type of Therapy: Individual Therapy  Treatment Goals addressed: Anxiety  Interventions: CBT  Summary: Maureen Randall is a 53 y.o. female who presents with continued symptoms related to her diagnosis. Maureen Randall reports doing well since our last session. She reports her shoulder is healing well, but she has moved into a new phase of physical therapy which is more difficult and more painful. She reports she is supposed to be weaning off the sling, but is finding that difficult at times. LCSW validated these feelings, and encouraged Maureen Randall to utilize CBT in the moments she is feeling scared to remove her sling. For instance, Maureen Randall noted her arm felt like it would fall off when she removes the sling. LCSW walked Maureen Randall through how CBT could be utilized in these moments. [name] expressed understanding and agreement. Maureen Randall noted that since her previous session, they also found she had a deviated septum, and she subsequently had surgery  on that as well. She reported they found a bone spur in her nose, which explained why she was congested and got sinus infections frequently. LCSW held space for Maureen Randall to discuss this issue and her back problems, and how frustrating at times this process can be. LCSW validated Maureen Randall's feelings, and encouraged her to continue advocating for herself with her doctors. Maureen Randall expressed understanding and agreement with this information as well. She reports, outside of her physical health, everything has been going well.   Suicidal/Homicidal: No  Therapist Response: [name] continues to work towards her tx goals but has not yet reached them. We will continue to work on improving emotional regulation skills and improving communication moving forward.   Plan: Return again in 4 weeks.  Diagnosis: Axis I: Generalized Anxiety Disorder    Axis II: No diagnosis    Alden Hipp, LCSW 06/22/2019

## 2019-07-06 ENCOUNTER — Other Ambulatory Visit: Payer: Self-pay | Admitting: Obstetrics and Gynecology

## 2019-07-06 DIAGNOSIS — Z1231 Encounter for screening mammogram for malignant neoplasm of breast: Secondary | ICD-10-CM

## 2019-07-07 ENCOUNTER — Ambulatory Visit (INDEPENDENT_AMBULATORY_CARE_PROVIDER_SITE_OTHER): Payer: 59 | Admitting: Internal Medicine

## 2019-07-07 ENCOUNTER — Other Ambulatory Visit: Payer: Self-pay

## 2019-07-07 VITALS — BP 120/80 | Ht 67.0 in | Wt 216.0 lb

## 2019-07-07 DIAGNOSIS — M25551 Pain in right hip: Secondary | ICD-10-CM

## 2019-07-07 DIAGNOSIS — E669 Obesity, unspecified: Secondary | ICD-10-CM | POA: Diagnosis not present

## 2019-07-07 DIAGNOSIS — M159 Polyosteoarthritis, unspecified: Secondary | ICD-10-CM | POA: Insufficient documentation

## 2019-07-07 DIAGNOSIS — M545 Low back pain, unspecified: Secondary | ICD-10-CM

## 2019-07-07 DIAGNOSIS — M62838 Other muscle spasm: Secondary | ICD-10-CM | POA: Diagnosis not present

## 2019-07-07 DIAGNOSIS — R7303 Prediabetes: Secondary | ICD-10-CM

## 2019-07-07 DIAGNOSIS — G894 Chronic pain syndrome: Secondary | ICD-10-CM

## 2019-07-07 DIAGNOSIS — G8929 Other chronic pain: Secondary | ICD-10-CM

## 2019-07-07 DIAGNOSIS — R799 Abnormal finding of blood chemistry, unspecified: Secondary | ICD-10-CM

## 2019-07-07 DIAGNOSIS — E611 Iron deficiency: Secondary | ICD-10-CM

## 2019-07-07 DIAGNOSIS — M25552 Pain in left hip: Secondary | ICD-10-CM

## 2019-07-07 DIAGNOSIS — E785 Hyperlipidemia, unspecified: Secondary | ICD-10-CM

## 2019-07-07 MED ORDER — METHOCARBAMOL 500 MG PO TABS: 1000.0000 mg | ORAL_TABLET | Freq: Two times a day (BID) | ORAL | 2 refills | Status: DC | PRN

## 2019-07-07 MED ORDER — PHENTERMINE HCL 37.5 MG PO TABS: 37.5000 mg | ORAL_TABLET | Freq: Every day | ORAL | 0 refills | Status: DC

## 2019-07-07 NOTE — Patient Instructions (Addendum)
If you would like shingrix vaccine call the office to get this here please 2 doses 2nd dose 2-<6 months of the 1st  Consider Tdap vaccine as well  Space all vaccines out by 1 month we have both in the office  Or your pharmacy may give them to you let me know if you want me to sent Tdap and shingrix shots to your pharmacy   [facility] or Dr. Deetta Perla with [facility]   Zoster Vaccine, Recombinant injection What is this medicine? ZOSTER VACCINE (ZOS ter vak SEEN) is used to prevent shingles in adults 53 years old and over. This vaccine is not used to treat shingles or nerve pain from shingles. This medicine may be used for other purposes; ask your health care provider or pharmacist if you have questions. COMMON BRAND NAME(S): [facility] What should I tell my health care provider before I take this medicine? They need to know if you have any of these conditions:  blood disorders or disease  cancer like leukemia or lymphoma  immune system problems or therapy  an unusual or allergic reaction to vaccines, other medications, foods, dyes, or preservatives  pregnant or trying to get pregnant  breast-feeding How should I use this medicine? This vaccine is for injection in a muscle. It is given by a health care professional. Talk to your pediatrician regarding the use of this medicine in children. This medicine is not approved for use in children. Overdosage: If you think you have taken too much of this medicine contact a poison control center or emergency room at once. NOTE: This medicine is only for you. Do not share this medicine with others. What if I miss a dose? Keep appointments for follow-up (booster) doses as directed. It is important not to miss your dose. Call your doctor or health care professional if you are unable to keep an appointment. What may interact with this medicine?  medicines that suppress your immune system  medicines to treat cancer  steroid medicines  like prednisone or cortisone This list may not describe all possible interactions. Give your health care provider a list of all the medicines, herbs, non-prescription drugs, or dietary supplements you use. Also tell them if you smoke, drink alcohol, or use illegal drugs. Some items may interact with your medicine. What should I watch for while using this medicine? Visit your doctor for regular check ups. This vaccine, like all vaccines, may not fully protect everyone. What side effects may I notice from receiving this medicine? Side effects that you should report to your doctor or health care professional as soon as possible:  allergic reactions like skin rash, itching or hives, swelling of the face, lips, or tongue  breathing problems Side effects that usually do not require medical attention (report these to your doctor or health care professional if they continue or are bothersome):  chills  headache  fever  nausea, vomiting  redness, warmth, pain, swelling or itching at site where injected  tiredness This list may not describe all possible side effects. Call your doctor for medical advice about side effects. You may report side effects to FDA at 1-800-FDA-1088. Where should I keep my medicine? This vaccine is only given in a clinic, pharmacy, doctor's office, or other health care setting and will not be stored at home. NOTE: This sheet is a summary. It may not cover all possible information. If you have questions about this medicine, talk to your doctor, pharmacist, or health care provider.  2020 Elsevier/Gold  Standard (2017-02-04 13:20:30)  Phentermine tablets or capsules What is this medicine? PHENTERMINE (FEN ter meen) decreases your appetite. It is used with a reduced calorie diet and exercise to help you lose weight. This medicine may be used for other purposes; ask your health care provider or pharmacist if you have questions. COMMON BRAND NAME(S): Adipex-P, Atti-Plex P,  Atti-Plex P Spansule, Fastin, Lomaira, Pro-Fast, Tara-8 What should I tell my health care provider before I take this medicine? They need to know if you have any of these conditions:  agitation or nervousness  diabetes  glaucoma  heart disease  high blood pressure  history of drug abuse or addiction  history of stroke  kidney disease  lung disease called Primary Pulmonary Hypertension (PPH)  taken an MAOI like Carbex, Eldepryl, Marplan, Nardil, or Parnate in last 14 days  taking stimulant medicines for attention disorders, weight loss, or to stay awake  thyroid disease  an unusual or allergic reaction to phentermine, other medicines, foods, dyes, or preservatives  pregnant or trying to get pregnant  breast-feeding How should I use this medicine? Take this medicine by mouth with a glass of water. Follow the directions on the prescription label. Take your medicine at regular intervals. Do not take it more often than directed. Do not stop taking except on your doctor's advice. Talk to your pediatrician regarding the use of this medicine in children. While this drug may be prescribed for children 17 years or older for selected conditions, precautions do apply. Overdosage: If you think you have taken too much of this medicine contact a poison control center or emergency room at once. NOTE: This medicine is only for you. Do not share this medicine with others. What if I miss a dose? If you miss a dose, take it as soon as you can. If it is almost time for your next dose, take only that dose. Do not take double or extra doses. What may interact with this medicine? Do not take this medicine with any of the following medications:  MAOIs like Carbex, Eldepryl, Marplan, Nardil, and Parnate This medicine may also interact with the following medications:  alcohol  certain medicines for depression, anxiety, or psychotic disorders  certain medicines for high blood  pressure  linezolid  medicines for colds or breathing difficulties like pseudoephedrine or phenylephrine  medicines for diabetes  sibutramine  stimulant medicines for attention disorders, weight loss, or to stay awake This list may not describe all possible interactions. Give your health care provider a list of all the medicines, herbs, non-prescription drugs, or dietary supplements you use. Also tell them if you smoke, drink alcohol, or use illegal drugs. Some items may interact with your medicine. What should I watch for while using this medicine? Visit your doctor or health care provider for regular checks on your progress. Do not stop taking except on your health care provider's advice. You may develop a severe reaction. Your health care provider will tell you how much medicine to take. Do not take this medicine close to bedtime. It may prevent you from sleeping. You may get drowsy or dizzy. Do not drive, use machinery, or do anything that needs mental alertness until you know how this medicine affects you. Do not stand or sit up quickly, especially if you are an older patient. This reduces the risk of dizzy or fainting spells. Alcohol may increase dizziness and drowsiness. Avoid alcoholic drinks. This medicine may affect blood sugar levels. Ask your healthcare provider if changes in  diet or medicines are needed if you have diabetes. Women should inform their health care provider if they wish to become pregnant or think they might be pregnant. Losing weight while pregnant is not advised and may cause harm to the unborn child. Talk to your health care provider for more information. What side effects may I notice from receiving this medicine? Side effects that you should report to your doctor or health care professional as soon as possible:  allergic reactions like skin rash, itching or hives, swelling of the face, lips, or tongue  breathing problems  changes in emotions or  moods  changes in vision  chest pain or chest tightness  fast, irregular heartbeat  feeling faint or lightheaded  increased blood pressure  irritable  restlessness  tremors  seizures  signs and symptoms of a stroke like changes in vision; confusion; trouble speaking or understanding; severe headaches; sudden numbness or weakness of the face, arm or leg; trouble walking; dizziness; loss of balance or coordination  unusually weak or tired Side effects that usually do not require medical attention (report to your doctor or health care professional if they continue or are bothersome):  changes in taste  constipation or diarrhea  dizziness  dry mouth  headache  trouble sleeping  upset stomach This list may not describe all possible side effects. Call your doctor for medical advice about side effects. You may report side effects to FDA at 1-800-FDA-1088. Where should I keep my medicine? Keep out of the reach of children. This medicine can be abused. Keep your medicine in a safe place to protect it from theft. Do not share this medicine with anyone. Selling or giving away this medicine is dangerous and against the law. This medicine may cause harm and death if it is taken by other adults, children, or pets. Return medicine that has not been used to an official disposal site. Contact the DEA at 6810166341 or your city/county government to find a site. If you cannot return the medicine, mix any unused medicine with a substance like cat litter or coffee grounds. Then throw the medicine away in a sealed container like a sealed bag or coffee can with a lid. Do not use the medicine after the expiration date. Store at room temperature between 20 and 25 degrees C (68 and 77 degrees F). Keep container tightly closed. NOTE: This sheet is a summary. It may not cover all possible information. If you have questions about this medicine, talk to your doctor, pharmacist, or health care  provider.  2020 Elsevier/Gold Standard (2019-05-01 12:54:20)

## 2019-07-07 NOTE — Progress Notes (Signed)
Virtual Visit via Video Note  I connected with Gean Maidens  on 07/07/19 at  2:52 PM EST by a video enabled telemedicine application and verified that I am speaking with the correct person using two identifiers.  Location patient: home Location provider:work or home office Persons participating in the virtual visit: patient, provider  I discussed the limitations of evaluation and management by telemedicine and the availability of in person appointments. The patient expressed understanding and agreed to proceed.   HPI: 1. S/p surgery for deviated septum Dr. Tami Ribas no nasal polyps but she had a bone spur in the nose which was corrected by Dr. Tami Ribas 06/12/2019 and she is doing well getting over URI but overall well and feels like she can breath out of her left nostril  2. Rheumatology appt today due to [place] Rose of RA Dr. Meda Coffee does not think she has this and to f/u in 1 year and rec PT Dr. Jerl Mina but pt does not want to pursue this at this time already in PT for shoulder surgery by Dr. [name] and has so many sessions with insurance after PT reports shoulder is sore but recovering from shoulder surgery  She is in PT 2x per week and shoulder surgery was in 04/2019  3. Obesity she wants to try Adipex we reviewed side effects and exercise and healthy diet choices in addition to the pill  4. Moderate to severe chronic pain esp daily back s/p fusion and b/l hip pain s/p b/l hip surgeries which limits her activity level. She f/u with the pain clinic. Back pain is worsening esp with activity. She is currently seeing Dr. Rennis Harding ortho spine in [place] and he did radiofreq ablation which did not help in 05/2019, SI jt injections have not helped had 2 weeks ago and also he disc spinal stimulator which pt does not want to do due to thinking its invasive. She has had 12 injections thus far over time and will f/u 07/2019 with him. She has tried Voltaren gel, heat multiple muscle relaxers I.e robaxin 500 mg q/o help  , baclofen w/o help, flexeril and zanafex which caused her muscle to feel weak  Rheumatology 07/07/19 visit does not think RA. She has had multiple injections of steroids over the years. She is s/p back fusion with ortho spine Dr. Dorthula Rue  01/13/2019 CT lumbar spine IMPRESSION: Sequelae of bilateral laminectomies with posterior instrumented fusion at L4-L5. The left-sided L4 pedicle screw tip is located outside of the vertebral body. No other evidence for hardware complication or failure.  Osseous fusion of the right L4 and L5 articular processes. 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  Of note she has had 4 surgeries since 2019 for numerous reasons see [facility]   ROS: See pertinent positives and negatives per HPI.  Past Medical History:  Diagnosis Date  . Anxiety   . Arthritis    osteoarthritis  . Asthma   . DVT (deep venous thrombosis) ([place]) 2012   right leg. after surgery, while on BCP  . GERD (gastroesophageal reflux disease)     Past Surgical History:  Procedure Laterality Date  . ABLATION     veins in lower legs  . BREAST BIOPSY Left 2011   neg  . CARPAL TUNNEL RELEASE Right 12/2015   [facility]  . CARPAL TUNNEL RELEASE Left 12/2016  . FOOT SURGERY Right 2015   broken navicular bone  . HIP ARTHROSCOPY Right 05/2011  . HIP SURGERY     left  hip surgery 05/2018  . NASAL TURBINATE REDUCTION Bilateral 06/12/2019   Procedure: TURBINATE REDUCTION/SUBMUCOSAL RESECTION;  Surgeon: [name], MD;  Location: [facility];  Service: ENT;  Laterality: Bilateral;  . 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 ROTATOR CUFF REPAIR     Dr. [name] in 04/2019   . SEPTOPLASTY Bilateral 06/12/2019   Procedure: SEPTOPLASTY;  Surgeon: [name], MD;  Location: [facility];  Service: ENT;  Laterality: Bilateral;  . SHOULDER SURGERY Right 12/2015   [facility] torn rotator cuff  . SPINAL FUSION  2016   [facility] lumbar     Family History  Problem Relation Age of Onset  . Breast cancer Maternal Aunt   . Breast cancer Paternal Aunt   . Dementia Paternal Aunt   . COPD Mother        quit smoking after 60 years  . Dementia Father   . Rheum arthritis Other        FH    SOCIAL HX:  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  Married 1 child    Current Outpatient Medications:  .  albuterol (VENTOLIN HFA) 108 (90 Base) MCG/ACT inhaler, Inhale 2 puffs into the lungs every 6 (six) hours as needed for wheezing or shortness of breath., Disp: 18 g, Rfl: 2 .  azelastine (ASTELIN) 0.1 % nasal spray, azelastine 137 mcg (0.1 %) nasal spray aerosol  INSTILL 1 SPRAY IEN D FOR ALLERGIES, Disp: , Rfl:  .  baclofen (LIORESAL) 10 MG tablet, Take 10 mg by mouth 3 (three) times daily as needed for muscle spasms., Disp: , Rfl:  .  budesonide-formoterol (SYMBICORT) 160-4.5 MCG/ACT inhaler, 2 puffs twice daily., Disp: 1 Inhaler, Rfl: 5 .  busPIRone (BUSPAR) 10 MG tablet, Take 1 tablet (10 mg total) by mouth 2 (two) times daily., Disp: 180 tablet, Rfl: 0 .  calcium carbonate (OS-CAL - DOSED IN MG OF ELEMENTAL CALCIUM) 1250 (500 Ca) MG tablet, Take by mouth., Disp: , Rfl:  .  Cholecalciferol (VITAMIN D3) 25 MCG (1000 UT) CAPS, Take by mouth., Disp: , Rfl:  .  Cyanocobalamin (B-12 COMPLIANCE INJECTION IJ), Inject as directed every 3 (three) months., Disp: , Rfl:  .  cyclobenzaprine (FLEXERIL) 10 MG tablet, cyclobenzaprine 10 mg tablet  TK 1 T PO Q 6-8 H PRF SPASM, Disp: , Rfl:  .  diclofenac sodium (VOLTAREN) 1 % GEL, , Disp: , Rfl: 11 .  DULoxetine (CYMBALTA) 60 MG capsule, Take 1 capsule (60 mg total) by mouth daily., Disp: 90 capsule, Rfl: 1 .  Elastic Bandages & Supports (RELIEF KNEE) MISC, Apply in the morning and remove at night., Disp: , Rfl:  .  estradiol (ESTRACE) 0.1 MG/GM vaginal cream, INSERT PEA SIZED AMOUNT (0.5G) INTO VAGINA NIGHTLY EVERY OTHER NIGHT FOR 2 WEEKS, THEN TWICE WEEKLY, Disp: , Rfl:  .  fluconazole  (DIFLUCAN) 200 MG tablet, Take 200 mg by mouth daily., Disp: , Rfl:  .  fluticasone (FLONASE) 50 MCG/ACT nasal spray, , Disp: , Rfl:  .  gabapentin (NEURONTIN) 300 MG capsule, Take 300 mg by mouth 2 (two) times daily., Disp: , Rfl:  .  HYDROcodone-Acetaminophen 5-300 MG TABS, Take 1-2 tablets by mouth every 4 (four) hours as needed., Disp: 40 tablet, Rfl: 0 .  hydrOXYzine (VISTARIL) 25 MG capsule, Take 1-2 capsules (25-50 mg total) by mouth daily as needed for anxiety. For severe anxiety attacks only, Disp: 60 capsule, Rfl: 1 .  ibuprofen (  ADVIL,MOTRIN) 800 MG tablet, TK 1 T PO Q 8 H WF PRN P, Disp: , Rfl: 2 .  Multiple Vitamin (MULTI-VITAMINS) TABS, Take by mouth., Disp: , Rfl:  .  nitrofurantoin (MACRODANTIN) 50 MG capsule, Take 1 capsule (50 mg total) by mouth as needed. UTI dose should be 100 mg bid x 5 days UTI  or 50-100 mg qhs prevention, Disp: 120 capsule, Rfl: 3 .  omeprazole (PRILOSEC) 20 MG capsule, Take 1 capsule (20 mg total) by mouth daily., Disp: 30 capsule, Rfl: 0 .  oxyCODONE-acetaminophen (PERCOCET) 10-325 MG tablet, Take 1 tablet by mouth every 6 (six) hours as needed., Disp: , Rfl:  .  traZODone (DESYREL) 100 MG tablet, Take 1-1.5 tablets (100-150 mg total) by mouth at bedtime as needed for sleep., Disp: 135 tablet, Rfl: 1 .  valACYclovir (VALTREX) 1000 MG tablet, Oral blister, Disp: , Rfl:  .  VIMOVO 500-20 MG TBEC, Take 1 tablet by mouth 2 (two) times daily., Disp: , Rfl: 2 .  methocarbamol (ROBAXIN) 500 MG tablet, Take 2 tablets (1,000 mg total) by mouth 2 (two) times daily as needed for muscle spasms., Disp: 60 tablet, Rfl: 2 .  phentermine (ADIPEX-P) 37.5 MG tablet, Take 1 tablet (37.5 mg total) by mouth daily before breakfast., Disp: 60 tablet, Rfl: 0  EXAM:  VITALS per patient if applicable:  GENERAL: alert, oriented, appears well and in no acute distress  HEENT: atraumatic, conjunttiva clear, no obvious abnormalities on inspection of external nose and ears  NECK:  normal movements of the head and neck  LUNGS: on inspection no signs of respiratory distress, breathing rate appears normal, no obvious gross SOB, gasping or wheezing  CV: no obvious cyanosis  MS: moves all visible extremities without noticeable abnormality  PSYCH/NEURO: pleasant and cooperative, no obvious depression or anxiety, speech and thought processing grossly intact  ASSESSMENT AND PLAN:  Discussed the following assessment and plan:  Chronic low back pain, unspecified back pain laterality, unspecified whether sciatica present - Plan: Ambulatory referral to Neurosurgery Chronic pain syndrome - Plan: Ambulatory referral to Neurosurgery Chronic pain of both hips - Plan: Ambulatory referral to Neurosurgery She will try robaxin 1000 mg bid prn prev 500 mg dose was not effective   Obesity (BMI 30-39.9) - Plan: phentermine (ADIPEX-P) 37.5 MG tablet 2 month supply. Can do 4 months on and 3 months off will need to f/u in Spring 2021 after labs   HM Declines flu shot  Tdap due possibly will check [facility] Disc shingrix today  Consider hep B vaccine in future NR 08/31/15  HCV neg 08/31/15   Pap neg 09/11/16 neg neg HPV Will f/u Dr. Leafy Ro has IUD upcoming appt 04/2019  Mammogram 08/05/18 negative sch 08/12/19 ob/gyn ordered Dr. Leafy Ro   Colonoscopy never had discussed prev will review at f/u   Dermatology was due 09/2018 f/u usu. Yearly [facility] Never smoker   Other providers Former PCP [facility] Dr. Garen Grams then [facility] requested all imaging Xrays, MRIS, CT Pain Dr. Alvira Monday in [place] specialist Dr. Rennis Harding ortho spine in [place] [facility]-Dr. Tami Ribas stated pt did not have allergies of note had testing Psychiatry and therapy-following  PT  Of note meds pt gets from PCP  Valtrex  Vimovo  adipex  Macrobid  Robaxin  voltaren gel   04/14/19 ENT Dr. Tami Ribas sinusitis deviated nasal septum right nasal endoscopy and  left nasacort 55 nasal hoarse voice small hemorrhage right cord 2/2 coughing  and inhalers f/u in 6 weeks after shoulder surgery   -we discussed possible serious and likely etiologies, options for evaluation and workup, limitations of telemedicine visit vs in person visit, treatment, treatment risks and precautions. Pt prefers to treat via telemedicine empirically rather then risking or undertaking an in person visit at this moment. Patient agrees to seek prompt in person care if worsening, new symptoms arise, or if is not improving with treatment.   I discussed the assessment and treatment plan with the patient. The patient was provided an opportunity to ask questions and all were answered. The patient agreed with the plan and demonstrated an understanding of the instructions.   The patient was advised to call back or seek an in-person evaluation if the symptoms worsen or if the condition fails to improve as anticipated.  Time spent 25 minutes  Delorise Jackson, MD

## 2019-07-09 ENCOUNTER — Encounter: Payer: Self-pay | Admitting: Internal Medicine

## 2019-07-09 DIAGNOSIS — M62838 Other muscle spasm: Secondary | ICD-10-CM | POA: Insufficient documentation

## 2019-07-13 ENCOUNTER — Telehealth: Payer: Self-pay | Admitting: Internal Medicine

## 2019-07-13 NOTE — Telephone Encounter (Signed)
Pt needs refill on diclofenac sodium (VOLTAREN) 1 % GEL [facility] on [address]

## 2019-07-15 ENCOUNTER — Other Ambulatory Visit: Payer: Self-pay | Admitting: Internal Medicine

## 2019-07-15 DIAGNOSIS — M1990 Unspecified osteoarthritis, unspecified site: Secondary | ICD-10-CM

## 2019-07-15 MED ORDER — DICLOFENAC SODIUM 1 % EX GEL: 2.0000 g | Freq: Four times a day (QID) | CUTANEOUS | 11 refills | Status: DC

## 2019-07-20 ENCOUNTER — Ambulatory Visit (INDEPENDENT_AMBULATORY_CARE_PROVIDER_SITE_OTHER): Payer: 59 | Admitting: Licensed Clinical Social Worker

## 2019-07-20 ENCOUNTER — Other Ambulatory Visit: Payer: Self-pay

## 2019-07-20 ENCOUNTER — Encounter: Payer: Self-pay | Admitting: Licensed Clinical Social Worker

## 2019-07-20 DIAGNOSIS — F411 Generalized anxiety disorder: Secondary | ICD-10-CM | POA: Diagnosis not present

## 2019-07-20 NOTE — Progress Notes (Signed)
Virtual Visit via Video Note  I connected with Maureen Randall on 07/20/19 at 10:00 AM EST by a video enabled telemedicine application and verified that I am speaking with the correct person using two identifiers.   I discussed the limitations of evaluation and management by telemedicine and the availability of in person appointments. The patient expressed understanding and agreed to proceed.   I discussed the assessment and treatment plan with the patient. The patient was provided an opportunity to ask questions and all were answered. The patient agreed with the plan and demonstrated an understanding of the instructions.   The patient was advised to call back or seek an in-person evaluation if the symptoms worsen or if the condition fails to improve as anticipated.  I provided 45 minutes of non-face-to-face time during this encounter.   Alden Hipp, LCSW    THERAPIST PROGRESS NOTE  Session Time: 1000  Participation Level: Active  Behavioral Response: NeatAlertAnxious  Type of Therapy: Individual Therapy  Treatment Goals addressed: Coping  Interventions: Supportive  Summary: Maureen Randall is a 54 y.o. female who presents with continued symptoms related to her diagnosis. Maureen Randall reports doing well since our last session. Maureen Randall reports feeling she has been more moody and "snappy," lately due to physical pain. LCSW validated Atiyana's feelings and encouraged her to recognize the close link between our mental and physical health. LCSW asked Maureen Randall to think of what she would tell a friend in this situation. Maureen Randall was able to recognize she would tell her friend it is okay to feel how she's feeling, and to be easier on herself. LCSW encouraged Maureen Randall to take her own advice. Maureen Randall reported feeling that her mood is also linked to feeling like she's not doing anything in a day. 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 Because  of this, it would seem Maureen Randall's thoguhts about her worth are tied to her productivity. Maureen Randall related to this idea and expressed not knowing what to do about it. LCSW encouraged Maureen Randall to start making to-do lists at the beginning of every day, and start to shift her thinking into those being her tasks and letting herself off the hook for other things. Maureen Randall was able to recognize this idea as well and expressed agreement.    Suicidal/Homicidal: No   Therapist Response: Maureen Randall continues to work towards her tx goals but has not yet reached them. We will continue to work on improving emotional regulation skills and CBT skills moving forward.   Plan: Return again in 4 weeks.  Diagnosis: Axis I: Generalized Anxiety Disorder    Axis II: No diagnosis    Alden Hipp, LCSW 07/20/2019

## 2019-07-23 ENCOUNTER — Other Ambulatory Visit: Payer: Self-pay | Admitting: Psychiatry

## 2019-07-23 DIAGNOSIS — F411 Generalized anxiety disorder: Secondary | ICD-10-CM

## 2019-07-23 DIAGNOSIS — G4701 Insomnia due to medical condition: Secondary | ICD-10-CM

## 2019-08-03 DIAGNOSIS — Z4789 Encounter for other orthopedic aftercare: Secondary | ICD-10-CM | POA: Insufficient documentation

## 2019-08-12 ENCOUNTER — Ambulatory Visit
Admission: RE | Admit: 2019-08-12 | Discharge: 2019-08-12 | Disposition: A | Discharge status: Home / Self Care | Payer: 59 | Source: Ambulatory Visit | Attending: Obstetrics and Gynecology | Admitting: Obstetrics and Gynecology

## 2019-08-12 DIAGNOSIS — Z1231 Encounter for screening mammogram for malignant neoplasm of breast: Secondary | ICD-10-CM | POA: Diagnosis not present

## 2019-08-12 IMAGING — MG DIGITAL SCREENING BILAT W/ TOMO W/ CAD
8 series · 8 of 24 positions shown · non-contrast
Comparison: Previous exam(s).

CLINICAL DATA: Screening.

EXAM:
DIGITAL SCREENING BILATERAL MAMMOGRAM WITH TOMO AND CAD

[L MLO synth-2D]
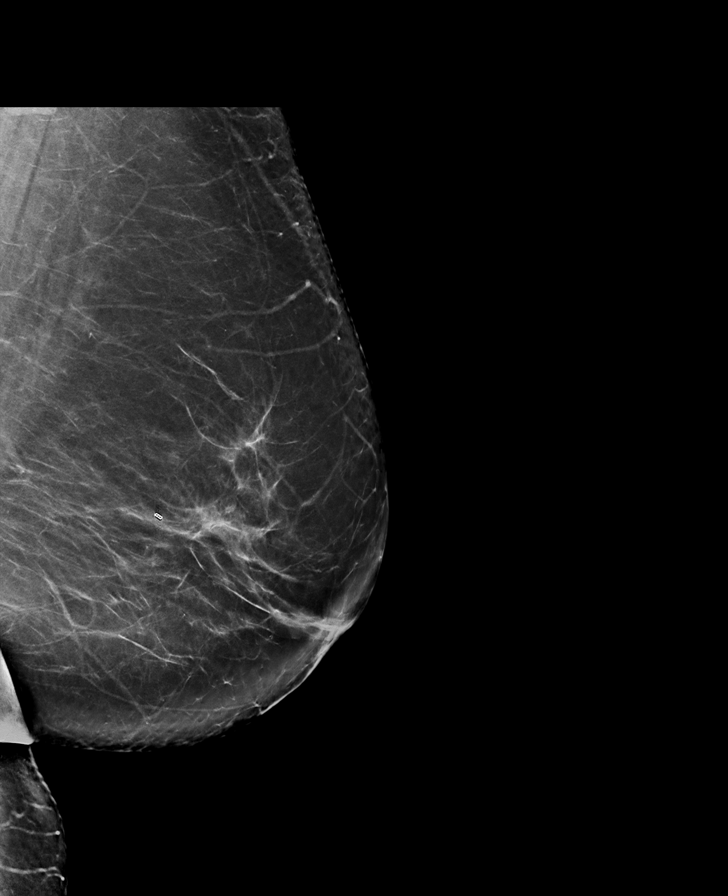

[L CC synth-2D]
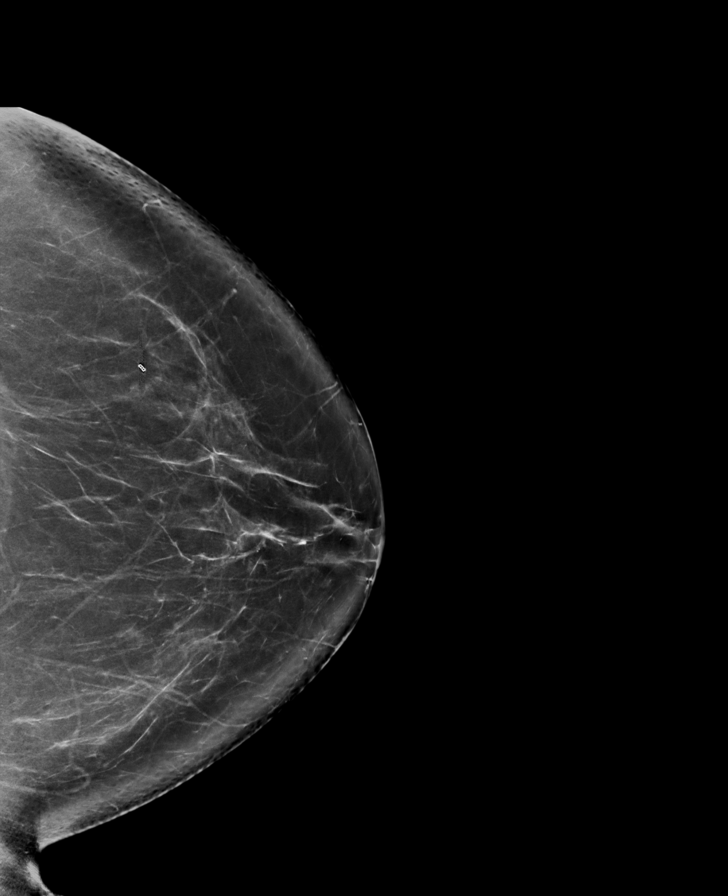

[R CC synth-2D]
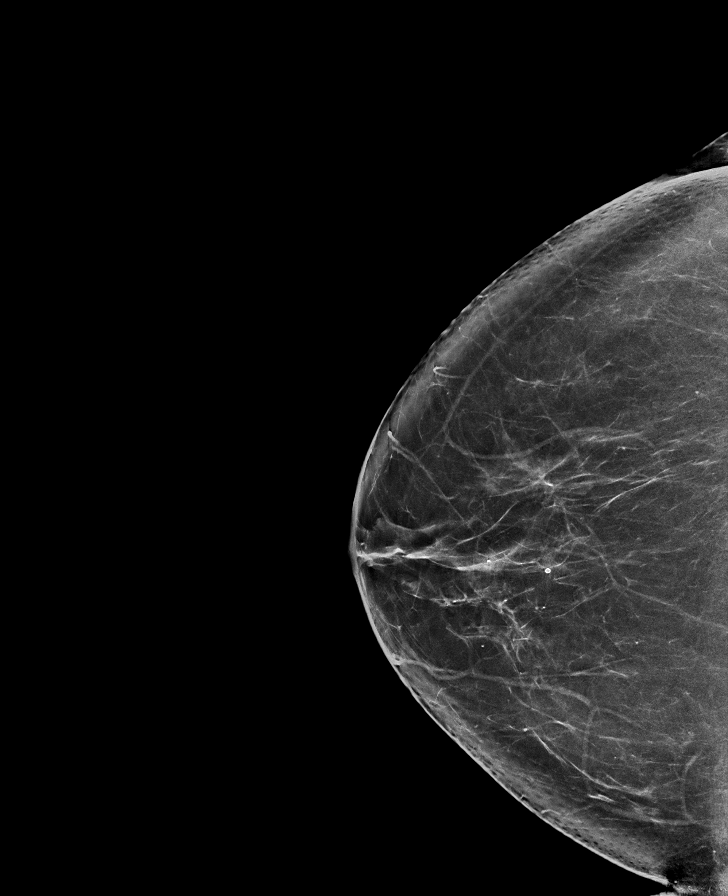

[R MLO synth-2D]
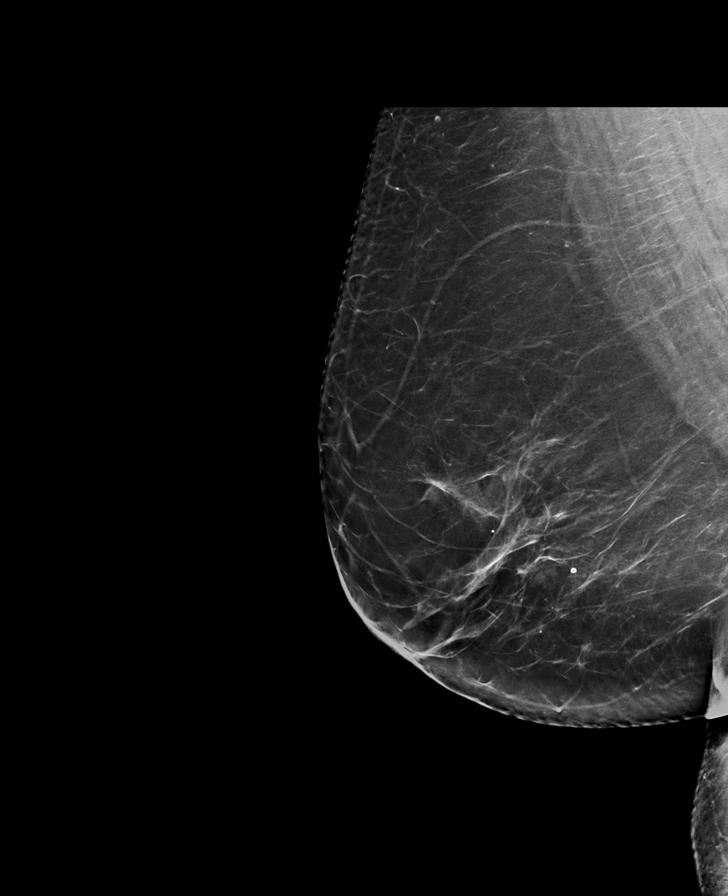

[L MLO tomo · tomo slice 47/92.0]
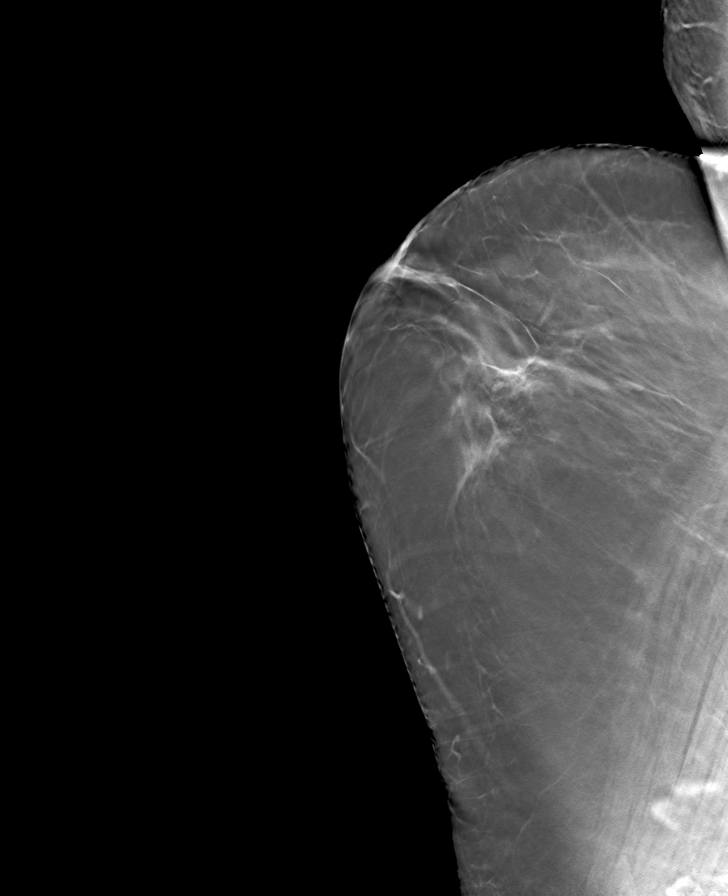

[R MLO tomo · tomo slice 45/90.0]
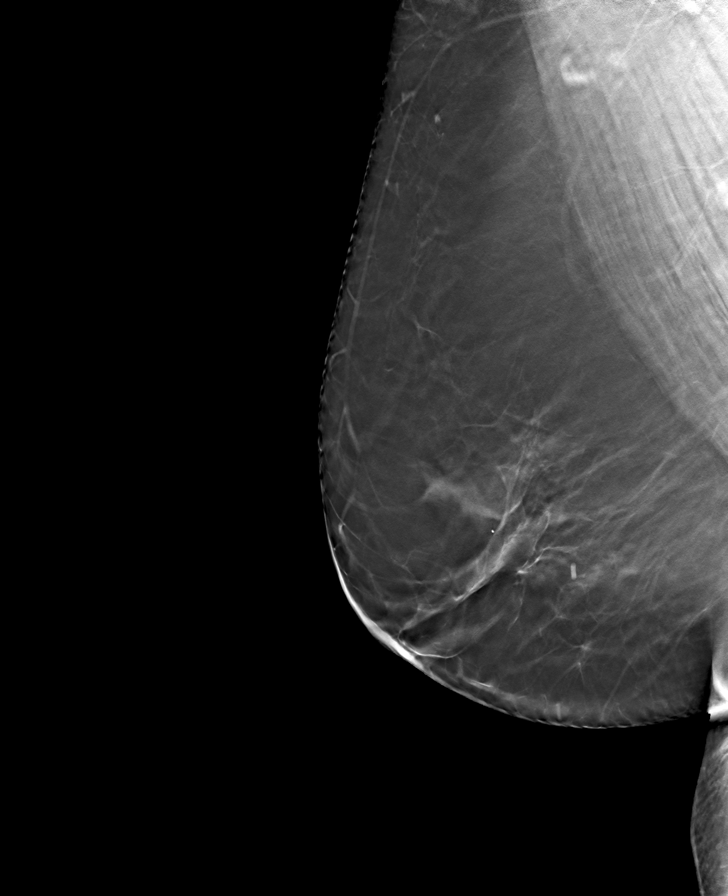

[L CC tomo · tomo slice 46/91.0]
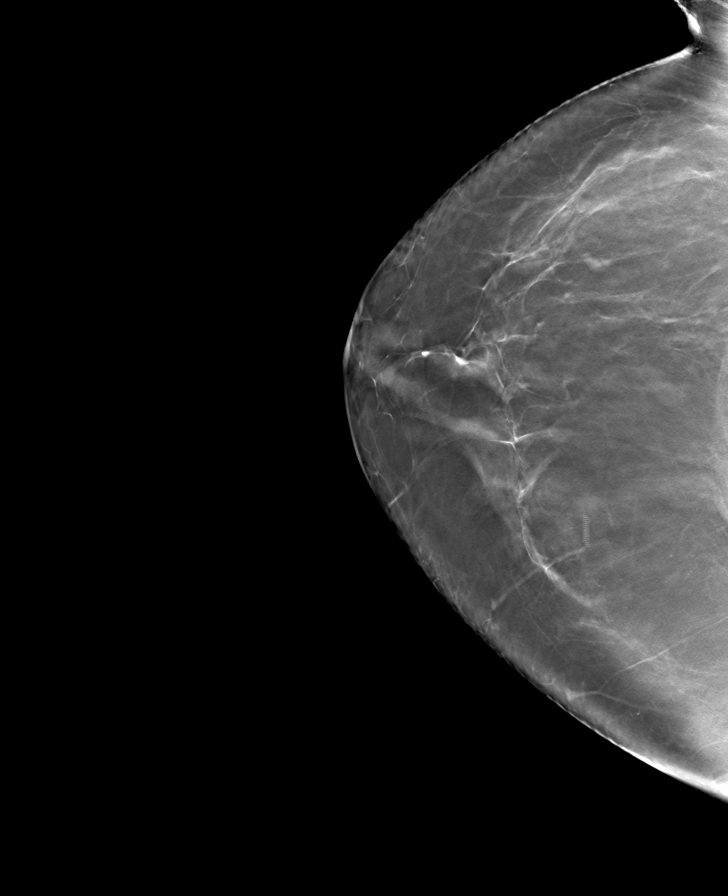

[R CC tomo · tomo slice 41/82.0]
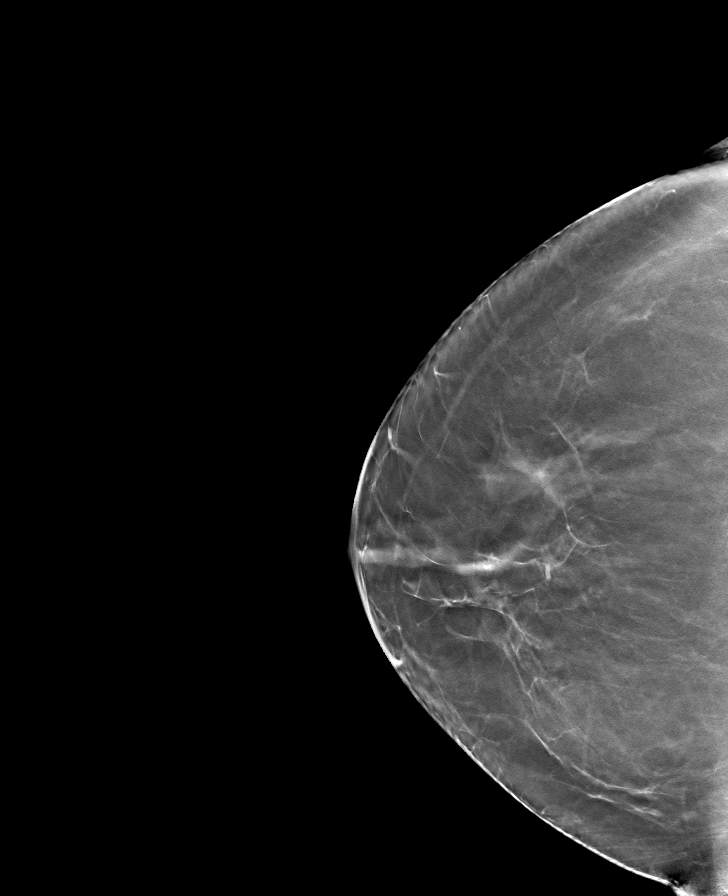

[8 of 24 positions shown; findings below may reference images not displayed]

ACR Breast Density Category b: There are scattered areas of
fibroglandular density.
FINDINGS: There are no findings suspicious for malignancy. Images were
processed with CAD.
IMPRESSION: No mammographic evidence of malignancy. A result letter of this
screening mammogram will be mailed directly to the patient.

RECOMMENDATION:
Screening mammogram in one year. (Code:[TQ])

BI-RADS CATEGORY  1: Negative.

## 2019-08-14 ENCOUNTER — Other Ambulatory Visit: Payer: Self-pay

## 2019-08-14 ENCOUNTER — Ambulatory Visit (INDEPENDENT_AMBULATORY_CARE_PROVIDER_SITE_OTHER): Payer: 59 | Admitting: Internal Medicine

## 2019-08-14 VITALS — BP 128/84 | Ht 67.0 in | Wt 215.0 lb

## 2019-08-14 DIAGNOSIS — Z981 Arthrodesis status: Secondary | ICD-10-CM

## 2019-08-14 DIAGNOSIS — R7303 Prediabetes: Secondary | ICD-10-CM

## 2019-08-14 DIAGNOSIS — G894 Chronic pain syndrome: Secondary | ICD-10-CM | POA: Diagnosis not present

## 2019-08-14 DIAGNOSIS — M5416 Radiculopathy, lumbar region: Secondary | ICD-10-CM

## 2019-08-14 DIAGNOSIS — E2839 Other primary ovarian failure: Secondary | ICD-10-CM

## 2019-08-14 DIAGNOSIS — R413 Other amnesia: Secondary | ICD-10-CM

## 2019-08-14 DIAGNOSIS — M1990 Unspecified osteoarthritis, unspecified site: Secondary | ICD-10-CM | POA: Diagnosis not present

## 2019-08-14 DIAGNOSIS — M4310 Spondylolisthesis, site unspecified: Secondary | ICD-10-CM

## 2019-08-14 DIAGNOSIS — K219 Gastro-esophageal reflux disease without esophagitis: Secondary | ICD-10-CM

## 2019-08-14 DIAGNOSIS — R238 Other skin changes: Secondary | ICD-10-CM

## 2019-08-14 DIAGNOSIS — M2550 Pain in unspecified joint: Secondary | ICD-10-CM

## 2019-08-14 DIAGNOSIS — E785 Hyperlipidemia, unspecified: Secondary | ICD-10-CM

## 2019-08-14 DIAGNOSIS — R233 Spontaneous ecchymoses: Secondary | ICD-10-CM

## 2019-08-14 MED ORDER — CELECOXIB 100 MG PO CAPS: 100.0000 mg | ORAL_CAPSULE | Freq: Every day | ORAL | 3 refills | Status: DC

## 2019-08-14 MED ORDER — OMEPRAZOLE 20 MG PO CPDR: 20.0000 mg | DELAYED_RELEASE_CAPSULE | Freq: Every day | ORAL | 3 refills | Status: DC

## 2019-08-14 NOTE — Progress Notes (Addendum)
Virtual Visit via Video Note  I connected with Maureen Randall  on 08/14/19 at 10:24 AM EST by a video enabled telemedicine application and verified that I am speaking with the correct person using two identifiers.  Location patient: home Location provider:work or home office Persons participating in the virtual visit: patient, provider  I discussed the limitations of evaluation and management by telemedicine and the availability of in person appointments. The patient expressed understanding and agreed to proceed.   HPI: 1. Chronic pain due to MSK issues OA and s/p back surgery pt feels vimovo is not helping has been on x 4 years.  Has been on celebrex in the past. Was on Duexis in the past but was not covered by her insurance in the past. She is currently on cymbalta 60 mg qd for chronic pain as well. Chronic low back pain pt reports "screw if off in the lower back" s/p back surgeyr   -Dr. Patrice Paradise in [place] [facility] has appt upcoming she had repeat imaging which we reviewed today  08/07/19 CT lumbar  Unchanged appearance of sequelae of bilateral L4 laminectomies with posterior instrumented fusion and interbody spacer at L4-5. Left L4 pedicle screw is situated lateral to the vertebral body, similar to prior. No new adverse hardware features. Osseous fusion of the right L4 and L5 posterior elements and osseous bridging of the left L4 and L5 posterior posterior elements, similar to prior.   Questionable new synovial facet cyst on the left at L3-4.  Unchanged appearance of other previously described degenerative changes, including L2 superior endplate Schmorl's node and intervertebral disc height loss at T12-L1 and L1-L2.  Result Narrative  EXAM: Computed tomography, lumbar spine without contrast material. 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 Coronal and sagittal reformatted images, bone and soft tissue algorithm are provided.  FINDINGS:  Unchanged appearance of bilateral L4 laminectomies and posterior instrumented fusion and interbody spacer at L4-5 with the left L4 pedicle screw situated lateral to the vertebral body. No new adverse hardware features. Similar appearance of osseous fusion of the right L4 and L5 articular processes with osseous bridging on the left. There is a questionable new synovial facet cyst on the left at L3-4.  Similar appearance of previously described degenerative changes, including L2 superior endplate Schmorl's node and mild intervertebral disc height loss most pronounced at T12-L1 and L1-L2. No evidence of significant osseous canal or neural foraminal narrowing.  There is no fracture. The vertebrae are normally aligned. No paravertebral soft tissue abnormality.   Similar appearance of incidentally noted bilateral total hip arthroplasties.  Other Result Information  Interface, Rad Results In - 08/07/2019 10:09 AM EST EXAM: Computed tomography, lumbar spine without contrast material. DATE: 08/07/2019 ACCESSION: [id number] UN DICTATED: 08/07/2019 9:38 AM INTERPRETATION LOCATION: [facility]  CLINICAL INDICATION: 54 years old Female with spondylosis with myelopathy  - M47.16 - Other spondylosis with myelopathy, lumbar region    COMPARISON: CT lumbar spine dated 01/13/2019  TECHNIQUE: Axial CT images through the lumbar spine without contrast. Coronal and sagittal reformatted images, bone and soft tissue algorithm are provided.  FINDINGS:  Unchanged appearance of bilateral L4 laminectomies and posterior instrumented fusion and interbody spacer at L4-5 with the left L4 pedicle screw situated lateral to  the vertebral body. No new adverse  hardware features. Similar appearance of osseous fusion of the right L4 and L5 articular processes with osseous bridging on the left. There is a questionable new synovial facet cyst on the left at L3-4.  Similar appearance of previously described degenerative changes, including L2 superior endplate Schmorl's node and mild intervertebral disc height loss most pronounced at T12-L1 and L1-L2. No evidence of significant osseous canal or neural foraminal narrowing.  There is no fracture. The vertebrae are normally aligned. No paravertebral soft tissue abnormality.    Similar appearance of incidentally noted bilateral total hip arthroplasties.  IMPRESSION: Unchanged appearance of sequelae of bilateral L4 laminectomies with posterior instrumented fusion and interbody spacer at L4-5. Left L4 pedicle screw is situated lateral to the vertebral body, similar to prior. No new adverse hardware features. Osseous fusion of the right L4 and L5 posterior elements and osseous bridging of the left L4 and L5 posterior posterior elements, similar to prior.   Questionable new synovial facet cyst on the left at L3-4.  Unchanged appearance of other previously described degenerative changes, including L2 superior endplate Schmorl's node and intervertebral disc height loss at T12-L1 and L1-L2.    2. C/o easy bruising w/o FH easy bruising she will bump into something and bruise and has bruise to left upper arm which is getting better and does admit she is clumsy. Bruise has been there x 2 days  She stopped all vitamins thinking this would help   3. GERD more controlled now   4. C/o memory loss and c/w as dad had dementia and strong FH dementia in her family  Disc consider neurology in the future   ROS: See pertinent positives and negatives per HPI.  Past Medical History:  Diagnosis Date  . Anxiety   . Arthritis    osteoarthritis  . Asthma   . DVT (deep venous thrombosis) ([place]) 2012   right leg. after surgery,  while on BCP  . GERD (gastroesophageal reflux disease)     Past Surgical History:  Procedure Laterality Date  . ABLATION     veins in lower legs  . BREAST BIOPSY Left 2011   neg  . CARPAL TUNNEL RELEASE Right 12/2015   [facility]  . CARPAL TUNNEL RELEASE Left 12/2016  . FOOT SURGERY Right 2015   broken navicular bone  . HIP ARTHROSCOPY Right 05/2011  . HIP SURGERY     left hip surgery 05/2018  . NASAL TURBINATE REDUCTION Bilateral 06/12/2019   Procedure: TURBINATE REDUCTION/SUBMUCOSAL RESECTION;  Surgeon: [name], MD;  Location: [place] [facility];  Service: ENT;  Laterality: Bilateral;  . 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 ROTATOR CUFF REPAIR     Dr. [name] in 04/2019   . SEPTOPLASTY Bilateral 06/12/2019   Procedure: SEPTOPLASTY;  Surgeon: [name], MD;  Location: [facility];  Service: ENT;  Laterality: Bilateral;  . SHOULDER SURGERY Right 12/2015    [facility] torn rotator cuff  . SPINAL FUSION  2016   [facility] lumbar    Family History  Problem Relation Age of Onset  . Breast cancer Maternal Aunt   . Breast cancer Paternal Aunt   . Dementia Paternal Aunt   . COPD Mother        quit smoking after 60 years  . Dementia Father   . Rheum arthritis Other        FH    SOCIAL HX:  DPR  daughter and husband [name] and [name]  Married 1 child   Current Outpatient Medications:  .  albuterol (VENTOLIN HFA) 108 (90 Base) MCG/ACT inhaler, Inhale 2 puffs into the lungs every 6 (six) hours as needed for wheezing or shortness of breath., Disp: 18 g, Rfl: 2 .  azelastine (ASTELIN) 0.1 % nasal spray, azelastine 137 mcg (0.1 %) nasal spray aerosol  INSTILL 1 SPRAY IEN D FOR ALLERGIES, Disp: , Rfl:  .  baclofen (LIORESAL) 10 MG tablet, Take 10 mg by mouth 3 (three) times daily as needed for muscle spasms., Disp: , Rfl:  .  budesonide-formoterol (SYMBICORT) 160-4.5 MCG/ACT inhaler, 2 puffs twice daily.,  Disp: 1 Inhaler, Rfl: 5 .  busPIRone (BUSPAR) 10 MG tablet, Take 1 tablet (10 mg total) by mouth 2 (two) times daily., Disp: 180 tablet, Rfl: 0 .  cyclobenzaprine (FLEXERIL) 10 MG tablet, cyclobenzaprine 10 mg tablet  TK 1 T PO Q 6-8 H PRF SPASM, Disp: , Rfl:  .  diclofenac Sodium (VOLTAREN) 1 % GEL, Apply 2-4 g topically 4 (four) times daily. 2 gram upper body qid prn and 4 gram lower body qid prn, Disp: 100 g, Rfl: 11 .  DULoxetine (CYMBALTA) 60 MG capsule, Take 1 capsule (60 mg total) by mouth daily., Disp: 90 capsule, Rfl: 1 .  Elastic Bandages & Supports (RELIEF KNEE) MISC, Apply in the morning and remove at night., Disp: , Rfl:  .  estradiol (ESTRACE) 0.1 MG/GM vaginal cream, INSERT PEA SIZED AMOUNT (0.5G) INTO VAGINA NIGHTLY EVERY OTHER NIGHT FOR 2 WEEKS, THEN TWICE WEEKLY, Disp: , Rfl:  .  fluconazole (DIFLUCAN) 200 MG tablet, Take 200 mg by mouth daily., Disp: , Rfl:  .  fluticasone (FLONASE) 50 MCG/ACT nasal spray, , Disp: , Rfl:  .  gabapentin (NEURONTIN) 300 MG capsule, Take 300 mg by mouth 2 (two) times daily., Disp: , Rfl:  .  HYDROcodone-Acetaminophen 5-300 MG TABS, Take 1-2 tablets by mouth every 4 (four) hours as needed., Disp: 40 tablet, Rfl: 0 .  ibuprofen (ADVIL,MOTRIN) 800 MG tablet, TK 1 T PO Q 8 H WF PRN P, Disp: , Rfl: 2 .  methocarbamol (ROBAXIN) 500 MG tablet, Take 2 tablets (1,000 mg total) by mouth 2 (two) times daily as needed for muscle spasms., Disp: 60 tablet, Rfl: 2 .  nitrofurantoin (MACRODANTIN) 50 MG capsule, Take 1 capsule (50 mg total) by mouth as needed. UTI dose should be 100 mg bid x 5 days UTI  or 50-100 mg qhs prevention, Disp: 120 capsule, Rfl: 3 .  omeprazole (PRILOSEC) 20 MG capsule, Take 1 capsule (20 mg total) by mouth daily. 30 min before food, Disp: 90 capsule, Rfl: 3 .  oxyCODONE-acetaminophen (PERCOCET) 10-325 MG tablet, Take 1 tablet by mouth every 6 (six) hours as needed., Disp: , Rfl:  .  phentermine (ADIPEX-P) 37.5 MG tablet, Take 1 tablet (37.5  mg total) by mouth daily before breakfast., Disp: 60 tablet, Rfl: 0 .  traZODone (DESYREL) 100 MG tablet, TAKE 0.5-1 TABLETS (50-100 MG TOTAL) BY MOUTH AT BEDTIME AS NEEDED FOR SLEEP., Disp: 90 tablet, Rfl: 1 .  valACYclovir (VALTREX) 1000 MG tablet, Oral blister, Disp: , Rfl:  .  celecoxib (CELEBREX) 100 MG capsule, Take 1 capsule (100 mg total) by mouth daily., Disp: 90 capsule, Rfl: 3  EXAM:  VITALS per patient if applicable:  GENERAL: alert, oriented, appears well and in no acute distress  HEENT: atraumatic, conjunttiva clear, no obvious abnormalities on inspection of external nose and ears  NECK: normal movements of the head and neck  LUNGS: on inspection no signs of respiratory distress, breathing rate appears normal, no obvious gross SOB, gasping or wheezing  CV: no obvious cyanosis  MS: moves all visible extremities without noticeable abnormality  PSYCH/NEURO: pleasant and cooperative, no obvious depression or anxiety, speech and thought processing grossly intact  ASSESSMENT AND PLAN:  Discussed the following assessment and plan:  Chronic pain syndrome due to OA and s/p back surgery with abnormal CT lumbar- Plan:  Cont cymbalta  Was seeing Dr. Alvira Monday pain clinic in [place]  F/u Dr. Patrice Paradise spine surgeon in [place] with abnormal CT lumbar -lumbar surgery 08/26/19 tbd per Dr. Patrice Paradise filled out clearance form 09/03/19  Stop vimovo, duexis not covered in the past  Change to celexa 100 mg qd add prilosec 20 mg qd   Estrogen deficiency - Plan: DG Bone Density  Gastroesophageal reflux disease without esophagitis - Plan: omeprazole (PRILOSEC) 20 MG capsule  Prediabetes - Plan: Comprehensive metabolic panel, Hemoglobin A1c  Easy bruising - Plan: Comprehensive metabolic panel, CBC with Differential/Platelet, PTT, Protime-INR  Memory loss - Plan: Comprehensive metabolic panel, CBC with Differential/Platelet Consider neurology in the future FH dementia   Hyperlipidemia,  unspecified hyperlipidemia type - Plan: Lipid panel   HM Declines flu shot  Tdap due possibly will check [facility] shingrix prev.  Consider hep B vaccine in future NR 08/31/15  HCV neg 08/31/15   Pap neg 09/11/16 neg neg HPV Will f/u Dr. Leafy Ro has IUD upcoming appt10/2020  Mammogram 08/12/19 ob/gyn ordered Dr. Leafy Ro negative  dexa ordered   Colonoscopy never had discussed will review at f/u   Dermatology was due 09/2018 f/u usu. Yearly [facility] Never smoker   Other providers Former PCP [facility] Dr. Garen Grams then [place] [facility] requested all imaging Xrays, MRIS, CT Pain Dr. Alvira Monday in [place] specialist Dr. Rennis Harding ortho spine in [place] [facility]-Dr. Tami Ribas stated pt did not have allergies of note had testing Psychiatry and therapy-following  PT  Of note meds pt gets from PCP  Valtrex  adipex  Macrobid  Robaxin  voltaren gel   04/14/19 ENT Dr. Tami Ribas sinusitis deviated nasal septum right nasal endoscopy and left nasacort 55 nasal hoarse voice small hemorrhage right cord 2/2 coughing and inhalers f/u in 6 weeks after shoulder surgery  -we discussed possible serious and likely etiologies, options for evaluation and workup, limitations of telemedicine visit vs in person visit, treatment, treatment risks and precautions. Pt prefers to treat via telemedicine empirically rather then risking or undertaking an in person visit at this moment. Patient agrees to seek prompt in person care if worsening, new symptoms arise, or if is not improving with treatment.   I discussed the assessment and treatment plan with the patient. The patient was provided an opportunity to ask questions and all were answered. The patient agreed with the plan and demonstrated an understanding of the instructions.   The patient was advised to call back or seek an in-person evaluation if the symptoms worsen or if the condition fails to improve as  anticipated.  Time spent 20-29 minutes  Delorise Jackson, MD

## 2019-08-17 DIAGNOSIS — M19112 Post-traumatic osteoarthritis, left shoulder: Secondary | ICD-10-CM | POA: Insufficient documentation

## 2019-08-17 HISTORY — DX: Post-traumatic osteoarthritis, left shoulder: M19.112

## 2019-08-20 ENCOUNTER — Ambulatory Visit: Payer: 59 | Admitting: Licensed Clinical Social Worker

## 2019-08-21 ENCOUNTER — Telehealth: Payer: Self-pay | Admitting: Internal Medicine

## 2019-08-21 DIAGNOSIS — K219 Gastro-esophageal reflux disease without esophagitis: Secondary | ICD-10-CM

## 2019-08-21 NOTE — Telephone Encounter (Signed)
Patient is waiting for a refill on omeprazole (PRILOSEC) 20 MG capsule. Per her pharmacy, this medication needs a prior authorization through her insurance company.  Patient is calling to see how soon this will be filled.

## 2019-08-22 ENCOUNTER — Encounter: Payer: Self-pay | Admitting: Internal Medicine

## 2019-08-24 NOTE — Addendum Note (Signed)
Addended by: Orland Mustard on: 08/24/2019 02:26 PM   Modules accepted: Orders

## 2019-08-26 NOTE — Telephone Encounter (Signed)
Pt called to see if we have received the PA for this medication

## 2019-08-27 NOTE — Telephone Encounter (Signed)
Spoke with patient to get extra information as CoverMyMeds was having some trouble.  Member ID: [id number] Group # : [id number]  Rx Group: [id number] PCN: [id number] BIN: [id number]  Will re-attempt PA.

## 2019-08-28 MED ORDER — OMEPRAZOLE 20 MG PO CPDR: 20.0000 mg | DELAYED_RELEASE_CAPSULE | Freq: Every day | ORAL | 3 refills | Status: DC

## 2019-08-28 NOTE — Telephone Encounter (Signed)
Called patient's pharmacy and spoke with a pharmacists. He states that even with a PA the patient has exceeded the amount she can fill at a local pharmacy. States that she will have to use a mail order pharmacy for medication to be covered.   Informed the patient of this. She states she uses [facility]. Medication resent there.

## 2019-09-01 ENCOUNTER — Other Ambulatory Visit: Payer: Self-pay | Admitting: Internal Medicine

## 2019-09-01 DIAGNOSIS — K219 Gastro-esophageal reflux disease without esophagitis: Secondary | ICD-10-CM

## 2019-09-01 MED ORDER — OMEPRAZOLE 20 MG PO CPDR: 20.0000 mg | DELAYED_RELEASE_CAPSULE | Freq: Every day | ORAL | 3 refills | Status: DC

## 2019-09-01 NOTE — Addendum Note (Signed)
Addended by: Elpidio Galea T on: 09/01/2019 10:19 AM   Modules accepted: Orders

## 2019-09-01 NOTE — Telephone Encounter (Signed)
[facility] [address] [place] Then they will fill this.

## 2019-09-01 NOTE — Telephone Encounter (Signed)
Medication has been sent.  

## 2019-09-02 ENCOUNTER — Other Ambulatory Visit: Payer: Self-pay

## 2019-09-02 ENCOUNTER — Encounter: Payer: Self-pay | Admitting: Licensed Clinical Social Worker

## 2019-09-02 ENCOUNTER — Telehealth: Payer: Self-pay | Admitting: Internal Medicine

## 2019-09-02 ENCOUNTER — Ambulatory Visit (INDEPENDENT_AMBULATORY_CARE_PROVIDER_SITE_OTHER): Payer: 59 | Admitting: Licensed Clinical Social Worker

## 2019-09-02 DIAGNOSIS — F411 Generalized anxiety disorder: Secondary | ICD-10-CM | POA: Diagnosis not present

## 2019-09-02 NOTE — Telephone Encounter (Signed)
Prior authorization has been submitted for patient's Omeprazole  Awaiting approval or denial.

## 2019-09-02 NOTE — Progress Notes (Signed)
Virtual Visit via Video Note  I connected with [name] on 09/02/19 at  8:00 AM EST by a video enabled telemedicine application and verified that I am speaking with the correct person using two identifiers.   I discussed the limitations of evaluation and management by telemedicine and the availability of in person appointments. The patient expressed understanding and agreed to proceed.   I discussed the assessment and treatment plan with the patient. The patient was provided an opportunity to ask questions and all were answered. The patient agreed with the plan and demonstrated an understanding of the instructions.   The patient was advised to call back or seek an in-person evaluation if the symptoms worsen or if the condition fails to improve as anticipated.  I provided 60 minutes of non-face-to-face time during this encounter.   Alden Hipp, LCSW    THERAPIST PROGRESS NOTE  Session Time: 0800  Participation Level: Active  Behavioral Response: CasualAlertAnxious  Type of Therapy: Individual Therapy  Treatment Goals addressed: Coping  Interventions: CBT  Summary: KESI BRANK is a 54 y.o. female who presents with continued symptoms related to her diagnosis. Kristn reports doing "okay," since our last session, but noted it has been a difficult month. Rakelle reports her anxiety has been "through the roof," due to finding out she needs another surgery on her back. She reported meeting with a surgeon who reviewed her images and informed her a screw that was supposed to be in the bone had "missed its mark," and was not screwed in properly. Because of this, she will require another surgery to fix that, and that surgeon also found another issue in her back. Cherika reported feeling defeated and helpless regarding her physical health and how that has impacted her life. LCSW validated Abri's feelings and held space for her to discus her thoughts around the situation. She reported feeling  she had let her daughter down, as her daughter is having weight loss surgery in March, and Allisyn was going to watch her child as her daughter recovered, and assist in taking care of her daughter. LCSW encouraged Guida to remember feelings are not facts, and just because she feels she is letting her daughter down does not mean she actually is. We discussed ways to challenge thoughts as they came up in order to manage her anxiety around the situation. We also discussed focusing on the parts of these situations she can control, and trying to let go of the pieces she cannot. We discussed validating herself in order to allow herself to feel her feelings, but also to recognize the feeling so she can do something to improve it moving forward. Dametria expressed understanding and agreement with this information.   Suicidal/Homicidal: No  Therapist Response: Lella continues to work towards her tx goals but has not yet reached them. We will continue to work on improving CBT skills to assist in managing anxiety symptoms moving forward. We will also continue to work on improving distress tolerance and emotional regulation skills.   Plan: Return again in 2 weeks.  Diagnosis: Axis I: Generalized Anxiety Disorder    Axis II: No diagnosis    Alden Hipp, LCSW 09/02/2019

## 2019-09-05 ENCOUNTER — Other Ambulatory Visit: Payer: Self-pay | Admitting: Psychiatry

## 2019-09-05 DIAGNOSIS — F411 Generalized anxiety disorder: Secondary | ICD-10-CM

## 2019-09-05 DIAGNOSIS — G4701 Insomnia due to medical condition: Secondary | ICD-10-CM

## 2019-09-07 ENCOUNTER — Telehealth: Payer: Self-pay | Admitting: Internal Medicine

## 2019-09-07 NOTE — Telephone Encounter (Signed)
Medication approved Patient informed and verbalized understanding.

## 2019-09-07 NOTE — Telephone Encounter (Signed)
Patient inquiring about surgical release form that was to be faxed in to our office from her spine doctor. Informed patient that I will go through our paperwork to see if this was received.

## 2019-09-08 ENCOUNTER — Other Ambulatory Visit: Payer: Self-pay

## 2019-09-08 ENCOUNTER — Ambulatory Visit (INDEPENDENT_AMBULATORY_CARE_PROVIDER_SITE_OTHER): Payer: 59 | Admitting: Adult Health

## 2019-09-08 ENCOUNTER — Encounter: Payer: Self-pay | Admitting: Adult Health

## 2019-09-08 DIAGNOSIS — Z01818 Encounter for other preprocedural examination: Secondary | ICD-10-CM | POA: Diagnosis not present

## 2019-09-08 DIAGNOSIS — J4520 Mild intermittent asthma, uncomplicated: Secondary | ICD-10-CM

## 2019-09-08 NOTE — Patient Instructions (Signed)
May use Albuterol As needed  Wheezing .  Good luck with upcoming surgery .  Follow up with Dr. Mortimer Fries in 1 year and As needed

## 2019-09-08 NOTE — Progress Notes (Signed)
@Patient  ID: Maureen Randall, female    DOB: 1965/08/17, 54 y.o.   MRN: [id number]  Chief Complaint  Patient presents with  . Follow-up    Asthma     Referring provider: McLean-Scocuzza, Olivia Mackie *  HPI: 54 year old female followed for mild intermittent asthma and allergic rhinitis  TEST/EVENTS :   09/08/2019 follow-up asthma and allergic rhinitis, preop clearance Patient returns for a 52-month follow-up.  Patient has known underlying asthma and allergic rhinitis.  Patient states she has been doing very well with her asthma in fact has not been using Symbicort or albuterol since last visit.  She denies any cough wheezing shortness of breath.  Patient says she is independent at home.  She is limited by joint issues.  She is having upcoming back surgery.  She denies any shortness of breath cough or fever.  She needs pulmonary preop clearance.  Chest x-ray 2020 was without acute process.[name] PFT August 2020 showed normal lung function with no airflow obstruction or restriction.. FEV1 83%, ratio 79, FVC 83%.  No significant bronchodilator response.     Allergies  Allergen Reactions  . Amoxicillin Hives  . Septra [Sulfamethoxazole-Trimethoprim] Hives       . Zanaflex [Tizanidine]     Weak muscles   . Cephalexin Rash          There is no immunization history on file for this patient.  Past Medical History:  Diagnosis Date  . Anxiety   . Arthritis    osteoarthritis  . Asthma   . DVT (deep venous thrombosis) ([place]) 2012   right leg. after surgery, while on BCP  . GERD (gastroesophageal reflux disease)     Tobacco History: Social History   Tobacco Use  Smoking Status Never Smoker  Smokeless Tobacco Never Used   Counseling given: Not Answered   Outpatient Medications Prior to Visit  Medication Sig Dispense Refill  . albuterol (VENTOLIN HFA) 108 (90 Base) MCG/ACT inhaler Inhale 2 puffs into the lungs every 6 (six) hours as needed for wheezing or shortness of breath. 18 g 2    . baclofen (LIORESAL) 10 MG tablet Take 10 mg by mouth 3 (three) times daily as needed for muscle spasms.    . budesonide-formoterol (SYMBICORT) 160-4.5 MCG/ACT inhaler 2 puffs twice daily. 1 Inhaler 5  . busPIRone (BUSPAR) 10 MG tablet Take 1 tablet (10 mg total) by mouth 2 (two) times daily. 180 tablet 0  . celecoxib (CELEBREX) 100 MG capsule Take 1 capsule (100 mg total) by mouth daily. 90 capsule 3  . cyclobenzaprine (FLEXERIL) 10 MG tablet cyclobenzaprine 10 mg tablet  TK 1 T PO Q 6-8 H PRF SPASM    . diclofenac Sodium (VOLTAREN) 1 % GEL Apply 2-4 g topically 4 (four) times daily. 2 gram upper body qid prn and 4 gram lower body qid prn 100 g 11  . DULoxetine (CYMBALTA) 60 MG capsule Take 1 capsule (60 mg total) by mouth daily. 90 capsule 1  . Elastic Bandages & Supports (RELIEF KNEE) MISC Apply in the morning and remove at night.    . estradiol (ESTRACE) 0.1 MG/GM vaginal cream INSERT PEA SIZED AMOUNT (0.5G) INTO VAGINA NIGHTLY EVERY OTHER NIGHT FOR 2 WEEKS, THEN TWICE WEEKLY    . fluconazole (DIFLUCAN) 200 MG tablet Take 200 mg by mouth daily.    [name] gabapentin (NEURONTIN) 300 MG capsule Take 300 mg by mouth 2 (two) times daily.    [name] ibuprofen (ADVIL,MOTRIN) 800 MG tablet TK 1 T PO  Q 8 H WF PRN P  2  . methocarbamol (ROBAXIN) 500 MG tablet Take 2 tablets (1,000 mg total) by mouth 2 (two) times daily as needed for muscle spasms. 60 tablet 2  . nitrofurantoin (MACRODANTIN) 50 MG capsule Take 1 capsule (50 mg total) by mouth as needed. UTI dose should be 100 mg bid x 5 days UTI  or 50-100 mg qhs prevention 120 capsule 3  . omeprazole (PRILOSEC) 20 MG capsule Take 1 capsule (20 mg total) by mouth daily. 30 min before food 90 capsule 3  . oxyCODONE-acetaminophen (PERCOCET) 10-325 MG tablet Take 1 tablet by mouth every 6 (six) hours as needed.    . phentermine (ADIPEX-P) 37.5 MG tablet Take 1 tablet (37.5 mg total) by mouth daily before breakfast. 60 tablet 0  . traZODone (DESYREL) 100 MG tablet  TAKE 1-1.5 TABLETS (100-150 MG TOTAL) BY MOUTH AT BEDTIME AS NEEDED FOR SLEEP. 135 tablet 1  . valACYclovir (VALTREX) 1000 MG tablet Oral blister    . azelastine (ASTELIN) 0.1 % nasal spray azelastine 137 mcg (0.1 %) nasal spray aerosol  INSTILL 1 SPRAY IEN D FOR ALLERGIES    . fluticasone (FLONASE) 50 MCG/ACT nasal spray     . HYDROcodone-Acetaminophen 5-300 MG TABS Take 1-2 tablets by mouth every 4 (four) hours as needed. 40 tablet 0   No facility-administered medications prior to visit.     Review of Systems:   Constitutional:   No  weight loss, night sweats,  Fevers, chills, fatigue, or  lassitude.  HEENT:   No headaches,  Difficulty swallowing,  Tooth/dental problems, or  Sore throat,                No sneezing, itching, ear ache, nasal congestion, post nasal drip,   CV:  No chest pain,  Orthopnea, PND, swelling in lower extremities, anasarca, dizziness, palpitations, syncope.   GI  No heartburn, indigestion, abdominal pain, nausea, vomiting, diarrhea, change in bowel habits, loss of appetite, bloody stools.   Resp: No shortness of breath with exertion or at rest.  No excess mucus, no productive cough,  No non-productive cough,  No coughing up of blood.  No change in color of mucus.  No wheezing.  No chest wall deformity  Skin: no rash or lesions.  GU: no dysuria, change in color of urine, no urgency or frequency.  No flank pain, no hematuria   MS: Chronic joint pain   Physical Exam  BP 126/72 (BP Location: Left Arm, Patient Position: Sitting, Cuff Size: Large)   Pulse 77   Temp (!) 97.5 F (36.4 C) (Temporal)   Ht 5\' 7"  (1.702 m)   Wt 216 lb 3.2 oz (98.1 kg)   SpO2 97% Comment: on ra  BMI 33.86 kg/m   GEN: A/Ox3; pleasant , NAD, well nourished    HEENT:  [place]/AT,    NOSE-clear, THROAT-clear, no lesions, no postnasal drip or exudate noted.   NECK:  Supple w/ fair ROM; no JVD; normal carotid impulses w/o bruits; no thyromegaly or nodules palpated; no lymphadenopathy.     RESP  Clear  P & A; w/o, wheezes/ rales/ or rhonchi. no accessory muscle use, no dullness to percussion  CARD:  RRR, no m/r/g, no peripheral edema, pulses intact, no cyanosis or clubbing.  GI:   Soft & nt; nml bowel sounds; no organomegaly or masses detected.   Musco: Warm bil, no deformities or joint swelling noted.   Neuro: alert, no focal deficits noted.  Skin: Warm, no lesions or rashes    Lab Results:  CBC  BNP No results found for: BNP  ProBNP No results found for: PROBNP  Imaging: MM 3D SCREEN BREAST BILATERAL  Result Date: 08/12/2019 CLINICAL DATA:  Screening. EXAM: DIGITAL SCREENING BILATERAL MAMMOGRAM WITH TOMO AND CAD COMPARISON:  Previous exam(s). ACR Breast Density Category b: There are scattered areas of fibroglandular density. FINDINGS: There are no findings suspicious for malignancy. Images were processed with CAD. IMPRESSION: No mammographic evidence of malignancy. A result letter of this screening mammogram will be mailed directly to the patient. RECOMMENDATION: Screening mammogram in one year. (Code:SM-B-01Y) BI-RADS CATEGORY  1: Negative. Electronically Signed   By: Lillia Mountain M.D.   On: 08/12/2019 11:30      No flowsheet data found.  No results found for: NITRICOXIDE      Assessment & Plan:   No problem-specific Assessment & Plan notes found for this encounter.     Rexene Edison, NP 09/08/2019

## 2019-09-09 NOTE — Telephone Encounter (Signed)
Release has been received, signed, and faxed back. Patient informed and will come get a copy, placed upfront for patient.  Original placed in scan pile.

## 2019-09-09 NOTE — Telephone Encounter (Signed)
Informed patient that we still have not received that form. She states she has a copy and will bring it into the office today 09/09/19.

## 2019-09-10 ENCOUNTER — Encounter: Payer: Self-pay | Admitting: Psychiatry

## 2019-09-10 ENCOUNTER — Ambulatory Visit (INDEPENDENT_AMBULATORY_CARE_PROVIDER_SITE_OTHER): Payer: 59 | Admitting: Psychiatry

## 2019-09-10 ENCOUNTER — Other Ambulatory Visit: Payer: Self-pay

## 2019-09-10 DIAGNOSIS — Z9889 Other specified postprocedural states: Secondary | ICD-10-CM

## 2019-09-10 DIAGNOSIS — F411 Generalized anxiety disorder: Secondary | ICD-10-CM | POA: Diagnosis not present

## 2019-09-10 DIAGNOSIS — M7520 Bicipital tendinitis, unspecified shoulder: Secondary | ICD-10-CM | POA: Insufficient documentation

## 2019-09-10 DIAGNOSIS — Z01818 Encounter for other preprocedural examination: Secondary | ICD-10-CM

## 2019-09-10 DIAGNOSIS — G4701 Insomnia due to medical condition: Secondary | ICD-10-CM

## 2019-09-10 HISTORY — DX: Bicipital tendinitis, unspecified shoulder: M75.20

## 2019-09-10 HISTORY — DX: Other specified postprocedural states: Z98.890

## 2019-09-10 HISTORY — DX: Encounter for other preprocedural examination: Z01.818

## 2019-09-10 MED ORDER — BUSPIRONE HCL 10 MG PO TABS: 10.0000 mg | ORAL_TABLET | Freq: Two times a day (BID) | ORAL | 0 refills | Status: DC

## 2019-09-10 NOTE — Progress Notes (Signed)
Provider Location : [facility] Patient Location : Home  Virtual Visit via Video Note  I connected with Maureen Randall on 09/10/19 at 11:20 AM EST by a video enabled telemedicine application and verified that I am speaking with the correct person using two identifiers.   I discussed the limitations of evaluation and management by telemedicine and the availability of in person appointments. The patient expressed understanding and agreed to proceed.     I discussed the assessment and treatment plan with the patient. The patient was provided an opportunity to ask questions and all were answered. The patient agreed with the plan and demonstrated an understanding of the instructions.   The patient was advised to call back or seek an in-person evaluation if the symptoms worsen or if the condition fails to improve as anticipated.  [facility] MD OP Progress Note  09/10/2019 12:20 PM [name]  MRN:  [id number]  Chief Complaint:  Chief Complaint    Follow-up     HPI: Maureen Randall is a 54 year old Caucasian female, has a history of GAD, insomnia, chronic pain, primary osteoarthritis, lumbar radiculopathy, degenerative disc disease, spondylolisthesis, bilateral carpal tunnel syndrome, recent surgery of her nose was evaluated by telemedicine today.  Patient today reports she is currently struggling with a lot of back pain.  She reports she was told by her orthopedic provider that she may need another surgery of her back.  She is planning to schedule it for the end of March or beginning of April.  She reports her daughter is also getting a surgery soon and they want to be there for each other and is planning accordingly.  Patient reports the pain does make her restless at night.  She has to sleep a certain way to be comfortable.  She takes the trazodone which helps to some extent.  She reports she is anxious about getting another surgery.  She however reports she has been seeing her therapist more frequently and that  has been beneficial.  She continues to be compliant on her Cymbalta and BuSpar.  She denies side effects.  Patient denies any suicidality, homicidality or perceptual disturbances.  Patient denies any other concerns today. Visit Diagnosis:    ICD-10-CM   1. GAD (generalized anxiety disorder)  F41.1 busPIRone (BUSPAR) 10 MG tablet  2. 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  Past trials of Xanax, diazepam, clonazepam, Pamelor  Past Medical History:  Past Medical History:  Diagnosis Date  . Anxiety   . Arthritis    osteoarthritis  . Asthma   . DVT (deep venous thrombosis) ([place]) 2012   right leg. after surgery, while on BCP  . GERD (gastroesophageal reflux disease)     Past Surgical History:  Procedure Laterality Date  . ABLATION     veins in lower legs  . BREAST BIOPSY Left 2011   neg  . CARPAL TUNNEL RELEASE Right 12/2015   [facility]  . CARPAL TUNNEL RELEASE Left 12/2016  . FOOT SURGERY Right 2015   broken navicular bone  . HIP ARTHROSCOPY Right 05/2011  . HIP SURGERY     left hip surgery 05/2018  . NASAL TURBINATE REDUCTION Bilateral 06/12/2019   Procedure: TURBINATE REDUCTION/SUBMUCOSAL RESECTION;  Surgeon: [name], MD;  Location: [facility];  Service: ENT;  Laterality: Bilateral;  . REPLACEMENT TOTAL HIP W/  RESURFACING IMPLANTS Right 12/2017   [facility]  .  ROTATOR CUFF REPAIR     Dr. [name] in 04/2019   . SEPTOPLASTY Bilateral 06/12/2019   Procedure: SEPTOPLASTY;  Surgeon: [name], MD;  Location: [facility];  Service: ENT;  Laterality: Bilateral;  . SHOULDER SURGERY Right 12/2015   [facility] torn rotator cuff  . SPINAL FUSION  2016   [facility] lumbar    Family Psychiatric History: I have reviewed family psychiatric history from my progress note on 04/30/2018  Family History:  Family History  Problem  Relation Age of Onset  . Breast cancer Maternal Aunt   . Breast cancer Paternal Aunt   . Dementia Paternal Aunt   . COPD Mother        quit smoking after 60 years  . Dementia Father   . Rheum arthritis Other        FH    Social History: Reviewed social history from my progress note on 04/30/2018 Social History   Socioeconomic History  . Marital status: Married    Spouse name: fredrick  . Number of children: 1  . Years of education: Not on file  . Highest education level: High school graduate  Occupational History    Comment: full time  Tobacco Use  . Smoking status: Never Smoker  . Smokeless tobacco: Never Used  Substance and Sexual Activity  . Alcohol use: Not Currently    Alcohol/week: 1.0 standard drinks    Types: 1 Cans of beer per week    Comment: may have drink 1x/month  . Drug use: Never  . Sexual activity: Yes  Other Topics Concern  . 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 Difficulty of Paying Living Expenses: Not on file  Food Insecurity:   . Worried About Charity fundraiser in the Last Year: Not on file  . Ran Out of Food in the Last Year: Not on file  Transportation Needs:   . Lack of Transportation (Medical): Not on file  . Lack of Transportation (Non-Medical): Not on file  Physical Activity:   . Days of Exercise per Week: Not on file  . Minutes of Exercise per Session: Not on file  Stress:   . Feeling of Stress : Not on file  Social Connections:   . Frequency of Communication with Friends and Family: Not on file  . Frequency of Social Gatherings with Friends and Family: Not on file  . Attends Religious Services: Not on file  . Active Member of Clubs or Organizations: Not on file  . Attends Archivist Meetings: Not on file  . Marital Status: Not on file    Allergies:  Allergies  Allergen Reactions   . Amoxicillin Hives  . Septra [Sulfamethoxazole-Trimethoprim] Hives       . Zanaflex [Tizanidine]     Weak muscles   . Cephalexin Rash         Metabolic Disorder Labs: Lab Results  Component Value Date   HGBA1C 5.7 01/05/2019   No results found for: PROLACTIN Lab Results  Component Value Date   CHOL 250 (H) 01/05/2019   TRIG 93.0 01/05/2019   HDL 75.90 01/05/2019   CHOLHDL 3 01/05/2019   VLDL 18.6 01/05/2019   LDLCALC 156 (H) 01/05/2019   Lab Results  Component Value Date   TSH 3.78 01/05/2019    Therapeutic Level Labs:  No results found for: LITHIUM No results found for: VALPROATE No components found for:  CBMZ  Current Medications: Current Outpatient Medications  Medication Sig Dispense Refill  . albuterol (VENTOLIN HFA) 108 (90 Base) MCG/ACT inhaler Inhale 2 puffs into the lungs every 6 (six) hours as needed for wheezing or shortness of breath. 18 g 2  . baclofen (LIORESAL) 10 MG tablet Take 10 mg by mouth 3 (three) times daily as needed for muscle spasms.    . budesonide-formoterol (SYMBICORT) 160-4.5 MCG/ACT inhaler 2 puffs twice daily. 1 Inhaler 5  . busPIRone (BUSPAR) 10 MG tablet Take 1 tablet (10 mg total) by mouth 2 (two) times daily. 180 tablet 0  . celecoxib (CELEBREX) 100 MG capsule Take 1 capsule (100 mg total) by mouth daily. 90 capsule 3  . cyclobenzaprine (FLEXERIL) 10 MG tablet cyclobenzaprine 10 mg tablet  TK 1 T PO Q 6-8 H PRF SPASM    . diclofenac Sodium (VOLTAREN) 1 % GEL Apply 2-4 g topically 4 (four) times daily. 2 gram upper body qid prn and 4 gram lower body qid prn 100 g 11  . DULoxetine (CYMBALTA) 60 MG capsule Take 1 capsule (60 mg total) by mouth daily. 90 capsule 1  . Elastic Bandages & Supports (RELIEF KNEE) MISC Apply in the morning and remove at night.    . estradiol (ESTRACE) 0.1 MG/GM vaginal cream INSERT PEA SIZED AMOUNT (0.5G) INTO VAGINA NIGHTLY EVERY OTHER NIGHT FOR 2 WEEKS, THEN TWICE WEEKLY    . fluconazole (DIFLUCAN) 200 MG  tablet Take 200 mg by mouth daily.    [name] gabapentin (NEURONTIN) 300 MG capsule Take 300 mg by mouth 2 (two) times daily.    [name] ibuprofen (ADVIL,MOTRIN) 800 MG tablet TK 1 T PO Q 8 H WF PRN P  2  . methocarbamol (ROBAXIN) 500 MG tablet Take 2 tablets (1,000 mg total) by mouth 2 (two) times daily as needed for muscle spasms. 60 tablet 2  . nitrofurantoin (MACRODANTIN) 50 MG capsule Take 1 capsule (50 mg total) by mouth as needed. UTI dose should be 100 mg bid x 5 days UTI  or 50-100 mg qhs prevention 120 capsule 3  . omeprazole (PRILOSEC) 20 MG capsule Take 1 capsule (20 mg total) by mouth daily. 30 min before food 90 capsule 3  . oxyCODONE-acetaminophen (PERCOCET) 10-325 MG tablet Take 1 tablet by mouth every 6 (six) hours as needed.    . phentermine (ADIPEX-P) 37.5 MG tablet Take 1 tablet (37.5 mg total) by mouth daily before breakfast. 60 tablet 0  . traZODone (DESYREL) 100 MG tablet TAKE 1-1.5 TABLETS (100-150 MG TOTAL) BY MOUTH AT BEDTIME AS NEEDED FOR SLEEP. 135 tablet 1  . valACYclovir (VALTREX) 1000 MG tablet Oral blister     No current facility-administered medications for this visit.     Musculoskeletal: Strength & Muscle Tone: UTA Gait & Station: normal Patient leans: N/A  Psychiatric Specialty Exam: Review of Systems  Musculoskeletal: Positive for back pain.  Psychiatric/Behavioral: Positive for sleep disturbance. The patient is nervous/anxious.   All other systems reviewed and are negative.   There were no vitals taken for this visit.There is no height or weight on file to calculate BMI.  General Appearance: Casual  Eye Contact:  Fair  Speech:  Clear and Coherent  Volume:  Normal  Mood:  Anxious coping ok  Affect:  Congruent  Thought Process:  Goal Directed and Descriptions of Associations: Intact  Orientation:  Full (Time, Place, and Person)  Thought  Content: Logical   Suicidal Thoughts:  No  Homicidal Thoughts:  No  Memory:  Immediate;   Fair Recent;   Fair Remote;    Fair  Judgement:  Fair  Insight:  Fair  Psychomotor Activity:  Normal  Concentration:  Concentration: Fair and Attention Span: Fair  Recall:  [employer] of Knowledge: Fair  Language: Fair  Akathisia:  No  Handed:  Right  AIMS (if indicated): UTA  Assets:  Communication Skills Desire for Improvement Housing Social Support  ADL's:  Intact  Cognition: WNL  Sleep:  Restless   Screenings: PHQ2-9     Office Visit from 08/14/2019 in [facility] Office Visit from 07/07/2019 in [facility] Office Visit from 03/13/2019 in [facility] from 02/11/2018 in [facility] Office Visit from 01/29/2018 in [facility]  PHQ-2 Total Score  0  0  0  0  0  PHQ-9 Total Score  0  --  0  --  --       Assessment and Plan: Lodena is a 54 year old Caucasian female, married, employed, currently on long-term disability, lives in [place], has a history of GAD, insomnia, chronic pain, osteoarthritis, degenerative spondylolisthesis, lumbar radiculopathy, bilateral carpal tunnel syndrome, multiple surgeries was evaluated by telemedicine today.  Patient with psychosocial stressors of chronic pain, upcoming surgery, history of multiple surgeries in the past.  Patient continues to struggle with pain which does affect her sleep and make her anxious.  She however reports she wants to continue psychotherapy sessions and is not interested in medication readjustment today.  Plan as noted below.  Plan GAD-restless Cymbalta 60 mg p.o. daily. Patient developed side effects on higher dosages-cognitive issues.  She is not interested in dosage readjustment today. BuSpar 10 mg p.o. twice daily Hydroxyzine 25 to 50 mg p.o. daily as needed for anxiety attacks. Continue psychotherapy sessions with Ms. Alden Hipp  Insomnia-restless due to pain Trazodone 100 to 150 mg p.o.  nightly as needed She will continue to follow-up with her providers for pain management.  Follow-up in clinic in 2 months or sooner if needed.  I have spent atleast 20 minutes non face to face with patient today. More than 50 % of the time was spent for ordering medications and test ,psychoeducation and supportive psychotherapy and care coordination,as well as documenting clinical information in electronic health record.This note was generated in part or whole with voice recognition software. Voice recognition is usually quite accurate but there are transcription errors that can and very often do occur. I apologize for any typographical errors that were not detected and corrected.       Ursula Alert, MD 09/10/2019, 12:20 PM

## 2019-09-10 NOTE — Assessment & Plan Note (Signed)
Pulmonary preop clearance-patient is cleared from pulmonary standpoint to proceed with surgery.  She can is considered a mild risk category-discussed with patient.     Major Pulmonary risks identified in the multifactorial risk analysis are but not limited to a) pneumonia; b) recurrent intubation risk; c) prolonged or recurrent acute respiratory failure needing mechanical ventilation; d) prolonged hospitalization; e) DVT/Pulmonary embolism; f) Acute Pulmonary edema  Recommend 1. Short duration of surgery as much as possible and avoid paralytic if possible 2. Recovery in step down or ICU with Pulmonary consultation if indicated.  3. DVT prophylaxis if indicated.  4. Aggressive pulmonary toilet with o2, bronchodilatation, and incentive spirometry and early ambulation

## 2019-09-10 NOTE — Assessment & Plan Note (Signed)
Mild intermittent asthma-currently well controlled.  Plan  Patient Instructions  May use Albuterol As needed  Wheezing .  Good luck with upcoming surgery .  Follow up with Dr. Mortimer Fries in 1 year and As needed

## 2019-09-14 ENCOUNTER — Telehealth: Payer: Self-pay | Admitting: Internal Medicine

## 2019-09-14 ENCOUNTER — Encounter: Payer: Self-pay | Admitting: Internal Medicine

## 2019-09-14 NOTE — Telephone Encounter (Signed)
Patient states she is currently taking the Baclofen. States that she alternates every couple of weeks between taking the Baclofen and the Methocarbamol.  Informed the patient that she should only be on one of these medications and the note on only getting these medications from one clinic.  Patient verbalized understanding and states she will talk with Dr Alvira Monday at her next appointment about these medications and him possibly taking over them both.

## 2019-09-14 NOTE — Telephone Encounter (Signed)
She should either be taking baclofen or robaxin/methocarbamol for a muscle relaxer and not both which one is she taking?   In the future muscle relaxers need to come from 1 clinic Dr. Alvira Monday pain clinic or ortho spine but PCP will not Rx as well   [place]

## 2019-09-15 ENCOUNTER — Other Ambulatory Visit: Payer: Self-pay

## 2019-09-15 ENCOUNTER — Encounter: Payer: Self-pay | Admitting: Licensed Clinical Social Worker

## 2019-09-15 ENCOUNTER — Ambulatory Visit (INDEPENDENT_AMBULATORY_CARE_PROVIDER_SITE_OTHER): Payer: 59 | Admitting: Licensed Clinical Social Worker

## 2019-09-15 DIAGNOSIS — F411 Generalized anxiety disorder: Secondary | ICD-10-CM | POA: Diagnosis not present

## 2019-09-15 NOTE — Progress Notes (Signed)
Virtual Visit via Telephone Note  I connected with Maureen Randall on 09/15/19 at 11:00 AM EST by telephone and verified that I am speaking with the correct person using two identifiers.   I discussed the limitations, risks, security and privacy concerns of performing an evaluation and management service by telephone and the availability of in person appointments. I also discussed with the patient that there may be a patient responsible charge related to this service. The patient expressed understanding and agreed to proceed.   I discussed the assessment and treatment plan with the patient. The patient was provided an opportunity to ask questions and all were answered. The patient agreed with the plan and demonstrated an understanding of the instructions.   The patient was advised to call back or seek an in-person evaluation if the symptoms worsen or if the condition fails to improve as anticipated.  I provided 45 minutes of non-face-to-face time during this encounter.   Alden Hipp, LCSW    THERAPIST PROGRESS NOTE  Session Time: 1100  Participation Level: Active  Behavioral Response: CasualAlertAnxious  Type of Therapy: Individual Therapy  Treatment Goals addressed: Coping  Interventions: Supportive  Summary: Maureen Randall is a 54 y.o. female who presents with continued symptoms related to her diagnosis. Maureen Randall reports doing well since our last session, bu noted increased anxiety around her upcoming surgery on her back. Maureen Randall reported she has still not gotten a clear answer about when her surgery will be scheduled. She reported feeling nervous her insurance would not pay for it, and worrying how she would pay for it. LCSW validated Maureen Randall's feelings and encouraged her to contact her doctor's office to advocate for herself and to obtain more information that might make her feel more at ease about the situation. Maureen Randall expressed understanding and agreement with this information. She also  noted feeling anxious about her daughter having surgery around the same time, and she had previously agreed to assist in taking care of her daughter during her recover. However, Maureen Randall reports she feels she needs to have her own surgery sooner rather than later, and worries her daughter will become angry. LCSW validated these feelings as well, and held space for Maureen Randall to discuss her thoughts around the situation. LCSW suggested Maureen Randall have an open dialogue with her daughter where they brainstorm solutions that will work for everyone, but also reminded Maureen Randall she cannot pour from an empty cup, and needs to take care of her own needs before she can help others. Maureen Randall expressed agreement.    Suicidal/Homicidal: No   Therapist Response: Maureen Randall continues to work towards her tx goals but has not yet reached them. We will continue to work on improving emotional regulation skills and distress tolerance moving forward.   Plan: Return again in 2 weeks.  Diagnosis: Axis I: Generalized Anxiety Disorder    Axis II: No diagnosis    Alden Hipp, LCSW 09/15/2019

## 2019-09-22 NOTE — Progress Notes (Addendum)
[facility]    Provider:  Dr Jaynee Eagles Requesting Provider: McLean-Scocuzza, Olivia Mackie * Primary Care Provider:  McLean-Scocuzza, Nino Glow, MD  CC:  Subjective memory loss  HPI:  Maureen Randall is a 54 y.o. female here as requested by McLean-Scocuzza, Olivia Mackie * for memory loss. PMHx DVT, Asthma, osteoarthritis, arthralgia, chronic pain syndrome, hx of lumbar fusion and right hip replacement, long term use of opiates, anxiety, obesity, insomnia, prediabetes. She feels within the past year she feels her brain is "foggy", a hard time remembering things, she can't remember the 3 words from the test today, she has to write things down, she forgets little things. Worse this year. Sometimes she [place] talking to people she will forget words. She forgot the word "wagon", she needs post-it notes and writing things down. She has a list today to tell me. Father started having dementia 66-67, but he was never diagnosed. She forgets people's names, forgets daily tasks, has to write notes, will forget words, she may have a hard time saying what she has to say, she is dizzy and clumsy like she feels disoriented, she has fallen. Going on for 2 years. She is not sleeping well. She has chronic pain. She says she doesn't sleep due to pain, no snoring. No other focal neurologic deficits, associated symptoms, inciting events or modifiable factors.  Reviewed notes, labs and imaging from outside physicians, which showed:  B12 606 12/2018, tsh normal 12/2018  Review of Systems: Patient complains of symptoms per HPI as well as the following symptoms: chronic pain, joint pain, osteoarthritis, insomnia. Pertinent negatives and positives per HPI. All others negative.   Social History   Socioeconomic History  . Marital status: Married    Spouse name: fredrick  . Number of children: 1  . Years of education: Not on file  . Highest education level: High school graduate  Occupational History    Comment: full time    Tobacco Use  . Smoking status: Never Smoker  . Smokeless tobacco: Never Used  Substance and Sexual Activity  . Alcohol use: Not Currently    Alcohol/week: 1.0 standard drinks    Types: 1 Cans of beer per week    Comment: may have drink 1x/month  . Drug use: Never  . Sexual activity: Yes  Other Topics Concern  . 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 Difficulty of Paying Living Expenses:   Food Insecurity:   . Worried About Charity fundraiser in the Last Year:   . Arboriculturist in the Last Year:   Transportation Needs:   . Film/video editor (Medical):   [name] Lack of Transportation (Non-Medical):   Physical Activity:   . Days of Exercise per Week:   . Minutes of Exercise per Session:   Stress:   . Feeling of Stress :   Social Connections:   . Frequency of Communication with Friends and Family:   . Frequency of Social Gatherings with Friends and Family:   . Attends Religious Services:   . Active Member of Clubs or Organizations:   . Attends Archivist Meetings:   [name] Marital Status:   Intimate Partner Violence:   . Fear of Current or  Ex-Partner:   . Emotionally Abused:   [name] Physically Abused:   . Sexually Abused:     Family History  Problem Relation Age of Onset  . Breast cancer Maternal Aunt   . Breast cancer Paternal Aunt   . Dementia Paternal Aunt   . COPD Mother        quit smoking after 60 years  . Dementia Father        symptoms started in his 20s  . Rheum arthritis Other        FH  . Dementia Other        "all my dad's side of the family"  . Other Maternal Grandmother        got dementia as she got older   . Dementia Paternal Grandmother     Past Medical History:  Diagnosis Date  . Anxiety   . Arthritis     osteoarthritis  . Asthma   . DVT (deep venous thrombosis) ([place]) 2012   right leg. after surgery, while on BCP  . GERD (gastroesophageal reflux disease)     Patient Active Problem List   Diagnosis Date Noted  . H/O nasal septoplasty 09/10/2019  . Biceps tendinitis 09/10/2019  . Preoperative clearance 09/10/2019  . 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 08/17/2019  . Encounter for orthopedic follow-up care 08/03/2019  . Muscle spasm 07/09/2019  . Primary osteoarthritis involving multiple joints 07/07/2019  . Disorder of left rotator cuff 04/02/2019  . Asthma 04/02/2019  . Prediabetes 04/01/2019  . Pain of left shoulder joint on movement 02/26/2019  . GAD (generalized anxiety disorder) 02/18/2019  . Insomnia due to medical condition 02/18/2019  . GERD (gastroesophageal reflux disease) 12/25/2018  . Recurrent UTI 12/25/2018  . Obesity (BMI 30-39.9) 12/25/2018  . Chronic deep vein thrombosis (DVT) of calf muscle vein of right lower extremity ([place]) 10/23/2018  . Venous insufficiency of both lower extremities 10/20/2018  . IUD threads lost 04/30/2018  . 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 History of lumbar fusion 01/29/2018  . Primary osteoarthritis of one hip, right 12/20/2017  . Closed fracture of metatarsal bone 08/06/2017  . Foot pain 08/06/2017  . Sprain of ankle 08/06/2017  . Strain of foot 01/17/2017  . Long term current use of opiate analgesic 08/31/2015  . Chronic pain syndrome 08/31/2015  . Bilateral carpal tunnel syndrome 08/31/2015  . Arthralgia of multiple joints 08/31/2015  . Increased band cell count 08/31/2015  . Bilateral lumbar radiculopathy 07/27/2014  . Degenerative spondylolisthesis 07/27/2014  . Facet arthropathy, lumbosacral 07/27/2014  . 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 Anxiety state 10/21/2009    Past Surgical History:  Procedure Laterality Date  . ABLATION     veins in lower legs    . BREAST BIOPSY Left 2011   neg  . CARPAL TUNNEL RELEASE Right 12/2015   [facility]  . CARPAL TUNNEL RELEASE Left 12/2016  . FOOT SURGERY Right 2015   broken navicular bone  . HIP ARTHROSCOPY Right 05/2011  . HIP SURGERY     left hip surgery 05/2018  . NASAL TURBINATE REDUCTION Bilateral 06/12/2019   Procedure: TURBINATE REDUCTION/SUBMUCOSAL RESECTION;  Surgeon: [name], MD;  Location: [place];  Service: ENT;  Laterality: Bilateral;  . 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 ROTATOR CUFF REPAIR     Dr. [name] in 04/2019   . SEPTOPLASTY Bilateral 06/12/2019  Procedure: SEPTOPLASTY;  Surgeon: [name], MD;  Location: [place];  Service: ENT;  Laterality: Bilateral;  . SHOULDER SURGERY Right 12/2015   [facility] torn rotator cuff  . SPINAL FUSION  2016   [facility] lumbar    Current Outpatient Medications  Medication Sig Dispense Refill  . albuterol (VENTOLIN HFA) 108 (90 Base) MCG/ACT inhaler Inhale 2 puffs into the lungs every 6 (six) hours as needed for wheezing or shortness of breath. 18 g 2  . baclofen (LIORESAL) 10 MG tablet Take 10 mg by mouth 3 (three) times daily as needed for muscle spasms.    . budesonide-formoterol (SYMBICORT) 160-4.5 MCG/ACT inhaler 2 puffs twice daily. 1 Inhaler 5  . busPIRone (BUSPAR) 10 MG tablet Take 1 tablet (10 mg total) by mouth 2 (two) times daily. 180 tablet 0  . celecoxib (CELEBREX) 100 MG capsule Take 1 capsule (100 mg total) by mouth daily. 90 capsule 3  . cyclobenzaprine (FLEXERIL) 10 MG tablet cyclobenzaprine 10 mg tablet  TK 1 T PO Q 6-8 H PRF SPASM    . diclofenac Sodium (VOLTAREN) 1 % GEL Apply 2-4 g topically 4 (four) times daily. 2 gram upper body qid prn and 4 gram lower body qid prn 100 g 11  . DULoxetine (CYMBALTA) 60 MG capsule Take 1 capsule (60 mg total) by mouth daily. 90 capsule 1  . Elastic Bandages & Supports (RELIEF KNEE) MISC Apply in the  morning and remove at night.    . estradiol (ESTRACE) 0.1 MG/GM vaginal cream INSERT PEA SIZED AMOUNT (0.5G) INTO VAGINA NIGHTLY EVERY OTHER NIGHT FOR 2 WEEKS, THEN TWICE WEEKLY    . fluconazole (DIFLUCAN) 200 MG tablet Take 200 mg by mouth daily.    [name] gabapentin (NEURONTIN) 300 MG capsule Take 300 mg by mouth 2 (two) times daily.    [name] ibuprofen (ADVIL,MOTRIN) 800 MG tablet TK 1 T PO Q 8 H WF PRN P  2  . methocarbamol (ROBAXIN) 500 MG tablet Take 2 tablets (1,000 mg total) by mouth 2 (two) times daily as needed for muscle spasms. 60 tablet 2  . nitrofurantoin (MACRODANTIN) 50 MG capsule Take 1 capsule (50 mg total) by mouth as needed. UTI dose should be 100 mg bid x 5 days UTI  or 50-100 mg qhs prevention 120 capsule 3  . omeprazole (PRILOSEC) 20 MG capsule Take 1 capsule (20 mg total) by mouth daily. 30 min before food 90 capsule 3  . oxyCODONE-acetaminophen (PERCOCET) 10-325 MG tablet Take 1 tablet by mouth every 6 (six) hours as needed.    . phentermine (ADIPEX-P) 37.5 MG tablet Take 1 tablet (37.5 mg total) by mouth daily before breakfast. 60 tablet 0  . traZODone (DESYREL) 100 MG tablet TAKE 1-1.5 TABLETS (100-150 MG TOTAL) BY MOUTH AT BEDTIME AS NEEDED FOR SLEEP. 135 tablet 1  . valACYclovir (VALTREX) 1000 MG tablet Oral blister     No current facility-administered medications for this visit.    Allergies as of 09/23/2019 - Review Complete 09/23/2019  Allergen Reaction Noted  . Amoxicillin Hives 06/14/2018  . Septra [sulfamethoxazole-trimethoprim] Hives 01/29/2018  . Zanaflex [tizanidine]  07/09/2019  . Cephalexin Rash 01/29/2018    Vitals: BP 127/84 (BP Location: Right Arm, Patient Position: Sitting)   Pulse 75   Temp (!) 96.9 F (36.1 C) Comment: taken at front  Ht 5\' 7"  (1.702 m)   Wt 213 lb (96.6 kg)   BMI 33.36 kg/m  Last Weight:  Wt Readings from Last 1  Encounters:  09/23/19 213 lb (96.6 kg)   Last Height:   Ht Readings from Last 1 Encounters:  09/23/19 5\' 7"   (1.702 m)     Physical exam: Exam: Gen: NAD, conversant, well nourised, obese, well groomed                     CV: RRR, no MRG. No Carotid Bruits. No peripheral edema, warm, nontender Eyes: Conjunctivae clear without exudates or hemorrhage  Neuro: Detailed Neurologic Exam  Speech:    Speech is normal; fluent and spontaneous with normal comprehension.  Cognition:  MMSE - Mini Mental State Exam 09/23/2019  Orientation to time 5  Orientation to Place 5  Registration 3  Attention/ Calculation 4  Recall 3  Language- name 2 objects 2  Language- repeat 1  Language- follow 3 step command 3  Language- read & follow direction 1  Write a sentence 0  Write a sentence-comments "take four steps backward"- did not have subject.  Copy design 0  Total score 27       The patient is oriented to person, place, and time;     recent and remote memory intact;     language fluent;     normal attention, concentration,     fund of knowledge Cranial Nerves:    The pupils are equal, round, and reactive to light. Could not visualize fundi due to small pupils. Visual fields are full to finger confrontation. Extraocular movements are intact. Trigeminal sensation is intact and the muscles of mastication are normal. The face is symmetric. The palate elevates in the midline. Hearing intact. Voice is normal. Shoulder shrug is normal. The tongue has normal motion without fasciculations.   Coordination:    No dysmetria  Gait:    Normal native gait  Motor Observation:    No asymmetry, no atrophy, and no involuntary movements noted. Tone:    Normal muscle tone.    Posture:    Posture is normal. normal erect    Strength: Difficult strength exam due to multiple points of pain but appears non focal.      Sensation: intact to LT     Reflex Exam:  DTR's:    Deep tendon reflexes in the upper and lower extremities are normal bilaterally.   Toes:    The toes are downgoing bilaterally.   Clonus:     Clonus is absent.    Assessment/Plan:  54 y.o. female here as requested by McLean-Scocuzza, Olivia Mackie * for memory loss. PMHx DVT, Asthma, osteoarthritis, arthralgia, chronic pain syndrome, hx of lumbar fusion and right hip replacement, long term use of opiates, anxiety, obesity, insomnia, prediabetes. I think patient's symptoms are due to multiple things including chronic pain, polypharmacy, poor sleep and normal aging. I don't believe patient has a neurodegenerative disorder at this time however given some of her symptoms such as word-finding difficullty we will order MRI of the brain to rule out stroke or other vascular etiologies.   Orders Placed This Encounter  Procedures  . MR BRAIN W WO CONTRAST   Cc: McLean-Scocuzza, Catheryn Bacon, MD  [facility] [address] [place], [place] [address]  Phone [phone] Fax [phone]  I spent 45 minutes of face-to-face and non-face-to-face time with patient on the  1. Memory loss   2. Word finding difficulty   3. Aphasia   4. Fall, initial encounter   5. FHx: dementia    diagnosis.  This included previsit  chart review, lab review, study review, order entry, electronic health record documentation, patient education on the different diagnostic and therapeutic options, counseling and coordination of care, risks and benefits of management, compliance, or risk factor reduction

## 2019-09-23 ENCOUNTER — Encounter: Payer: Self-pay | Admitting: Neurology

## 2019-09-23 ENCOUNTER — Other Ambulatory Visit: Payer: Self-pay

## 2019-09-23 ENCOUNTER — Ambulatory Visit (INDEPENDENT_AMBULATORY_CARE_PROVIDER_SITE_OTHER): Payer: 59 | Admitting: Neurology

## 2019-09-23 VITALS — BP 127/84 | HR 75 | Temp 96.9°F | Ht 67.0 in | Wt 213.0 lb

## 2019-09-23 DIAGNOSIS — R413 Other amnesia: Secondary | ICD-10-CM

## 2019-09-23 DIAGNOSIS — Z818 Family history of other mental and behavioral disorders: Secondary | ICD-10-CM

## 2019-09-23 DIAGNOSIS — R4789 Other speech disturbances: Secondary | ICD-10-CM | POA: Diagnosis not present

## 2019-09-23 DIAGNOSIS — R4701 Aphasia: Secondary | ICD-10-CM | POA: Diagnosis not present

## 2019-09-23 DIAGNOSIS — W19XXXA Unspecified fall, initial encounter: Secondary | ICD-10-CM

## 2019-09-23 NOTE — Patient Instructions (Signed)
MRI of the brain Look up the MIND diet  Memory Compensation Strategies  1. Use "WARM" strategy.  W= write it down  A= associate it  R= repeat it  M= make a mental note  2.   You can keep a Social worker.  Use a 3-ring notebook with sections for the following: calendar, important names and phone numbers,  medications, doctors' names/phone numbers, lists/reminders, and a section to journal what you did  each day.   3.    Use a calendar to write appointments down.  4.    Write yourself a schedule for the day.  This can be placed on the calendar or in a separate section of the Memory Notebook.  Keeping a  regular schedule can help memory.  5.    Use medication organizer with sections for each day or morning/evening pills.  You may need help loading it  6.    Keep a basket, or pegboard by the door.  Place items that you need to take out with you in the basket or on the pegboard.  You may also want to  include a message board for reminders.  7.    Use sticky notes.  Place sticky notes with reminders in a place where the task is performed.  For example: " turn off the  stove" placed by the stove, "lock the door" placed on the door at eye level, " take your medications" on  the bathroom mirror or by the place where you normally take your medications.  8.    Use alarms/timers.  Use while cooking to remind yourself to check on food or as a reminder to take your medicine, or as a  reminder to make a call, or as a reminder to perform another task, etc.

## 2019-09-25 ENCOUNTER — Other Ambulatory Visit (INDEPENDENT_AMBULATORY_CARE_PROVIDER_SITE_OTHER): Payer: 59

## 2019-09-25 ENCOUNTER — Other Ambulatory Visit: Payer: Self-pay

## 2019-09-25 DIAGNOSIS — R238 Other skin changes: Secondary | ICD-10-CM

## 2019-09-25 DIAGNOSIS — R413 Other amnesia: Secondary | ICD-10-CM | POA: Diagnosis not present

## 2019-09-25 DIAGNOSIS — M1990 Unspecified osteoarthritis, unspecified site: Secondary | ICD-10-CM | POA: Diagnosis not present

## 2019-09-25 DIAGNOSIS — N3000 Acute cystitis without hematuria: Secondary | ICD-10-CM

## 2019-09-25 DIAGNOSIS — R233 Spontaneous ecchymoses: Secondary | ICD-10-CM

## 2019-09-25 DIAGNOSIS — E785 Hyperlipidemia, unspecified: Secondary | ICD-10-CM

## 2019-09-25 DIAGNOSIS — E611 Iron deficiency: Secondary | ICD-10-CM

## 2019-09-25 DIAGNOSIS — R7303 Prediabetes: Secondary | ICD-10-CM

## 2019-09-25 DIAGNOSIS — G894 Chronic pain syndrome: Secondary | ICD-10-CM | POA: Diagnosis not present

## 2019-09-25 LAB — APTT: aPTT: 30.8 s (ref 23.4–32.7)

## 2019-09-25 LAB — CBC WITH DIFFERENTIAL/PLATELET
Basophils Absolute: 0.1 10*3/uL (ref 0.0–0.1)
Basophils Relative: 1.2 % (ref 0.0–3.0)
Eosinophils Absolute: 0.1 10*3/uL (ref 0.0–0.7)
Eosinophils Relative: 1.6 % (ref 0.0–5.0)
HCT: 39 % (ref 36.0–46.0)
Hemoglobin: 12.9 g/dL (ref 12.0–15.0)
Lymphocytes Relative: 24 % (ref 12.0–46.0)
Lymphs Abs: 1.4 10*3/uL (ref 0.7–4.0)
MCHC: 33.1 g/dL (ref 30.0–36.0)
MCV: 96.1 fl (ref 78.0–100.0)
Monocytes Absolute: 0.4 10*3/uL (ref 0.1–1.0)
Monocytes Relative: 7.2 % (ref 3.0–12.0)
Neutro Abs: 3.9 10*3/uL (ref 1.4–7.7)
Neutrophils Relative %: 66 % (ref 43.0–77.0)
Platelets: 376 10*3/uL (ref 150.0–400.0)
RBC: 4.06 Mil/uL (ref 3.87–5.11)
RDW: 13.3 % (ref 11.5–15.5)
WBC: 5.9 10*3/uL (ref 4.0–10.5)

## 2019-09-25 LAB — COMPREHENSIVE METABOLIC PANEL
ALT: 18 U/L (ref 0–35)
AST: 15 U/L (ref 0–37)
Albumin: 4.1 g/dL (ref 3.5–5.2)
Alkaline Phosphatase: 73 U/L (ref 39–117)
BUN: 22 mg/dL (ref 6–23)
CO2: 26 mEq/L (ref 19–32)
Calcium: 9.2 mg/dL (ref 8.4–10.5)
Chloride: 105 mEq/L (ref 96–112)
Creatinine, Ser: 0.68 mg/dL (ref 0.40–1.20)
GFR: 90.12 mL/min (ref >60.00)
Glucose, Bld: 105 mg/dL — ABNORMAL HIGH (ref 70–99)
Potassium: 3.7 mEq/L (ref 3.5–5.1)
Sodium: 138 mEq/L (ref 135–145)
Total Bilirubin: 0.4 mg/dL (ref 0.2–1.2)
Total Protein: 6.9 g/dL (ref 6.0–8.3)

## 2019-09-25 LAB — LIPID PANEL
Cholesterol: 217 mg/dL — ABNORMAL HIGH (ref 0–200)
HDL: 69.1 mg/dL (ref >39.00)
LDL Cholesterol: 132 mg/dL — ABNORMAL HIGH (ref 0–99)
NonHDL: 147.82
Total CHOL/HDL Ratio: 3
Triglycerides: 81 mg/dL (ref 0.0–149.0)
VLDL: 16.2 mg/dL (ref 0.0–40.0)

## 2019-09-25 LAB — PROTIME-INR
INR: 1 ratio (ref 0.8–1.0)
Prothrombin Time: 10.8 s (ref 9.6–13.1)

## 2019-09-25 LAB — HEMOGLOBIN A1C: Hgb A1c MFr Bld: 5.5 % (ref 4.6–6.5)

## 2019-09-26 LAB — URINE CULTURE
MICRO NUMBER:: 10270929
Result:: NO GROWTH
SPECIMEN QUALITY:: ADEQUATE

## 2019-09-26 LAB — URINALYSIS, ROUTINE W REFLEX MICROSCOPIC
Bilirubin Urine: NEGATIVE
Glucose, UA: NEGATIVE
Hgb urine dipstick: NEGATIVE
Ketones, ur: NEGATIVE
Leukocytes,Ua: NEGATIVE
Nitrite: NEGATIVE
Protein, ur: NEGATIVE
Specific Gravity, Urine: 1.019 (ref 1.001–1.03)
pH: <=5 (ref 5.0–8.0)

## 2019-09-26 LAB — IRON,TIBC AND FERRITIN PANEL
%SAT: 23 % (calc) (ref 16–45)
Ferritin: 37 ng/mL (ref 16–232)
Iron: 77 ug/dL (ref 45–160)
TIBC: 336 mcg/dL (calc) (ref 250–450)

## 2019-09-28 DIAGNOSIS — M4727 Other spondylosis with radiculopathy, lumbosacral region: Secondary | ICD-10-CM | POA: Insufficient documentation

## 2019-09-28 HISTORY — PX: OTHER SURGICAL HISTORY: SHX169

## 2019-09-30 ENCOUNTER — Other Ambulatory Visit: Payer: 59

## 2019-09-30 ENCOUNTER — Telehealth: Payer: Self-pay | Admitting: Neurology

## 2019-09-30 NOTE — Telephone Encounter (Signed)
no to the covid questions MR Brain w/wo contrast Dr. Jaynee Eagles [facility] Auth: [place] via [facility] website. Patient is scheduled at [facility] for 10/20/19.

## 2019-10-06 ENCOUNTER — Encounter: Payer: Self-pay | Admitting: Licensed Clinical Social Worker

## 2019-10-06 ENCOUNTER — Ambulatory Visit (INDEPENDENT_AMBULATORY_CARE_PROVIDER_SITE_OTHER): Payer: 59 | Admitting: Licensed Clinical Social Worker

## 2019-10-06 ENCOUNTER — Telehealth: Payer: Self-pay | Admitting: Internal Medicine

## 2019-10-06 ENCOUNTER — Other Ambulatory Visit: Payer: Self-pay

## 2019-10-06 DIAGNOSIS — F411 Generalized anxiety disorder: Secondary | ICD-10-CM

## 2019-10-06 NOTE — Progress Notes (Signed)
Virtual Visit via Video Note  I connected with Maureen Randall on 10/06/19 at 10:00 AM EDT by a video enabled telemedicine application and verified that I am speaking with the correct person using two identifiers.   I discussed the limitations of evaluation and management by telemedicine and the availability of in person appointments. The patient expressed understanding and agreed to proceed.    I discussed the assessment and treatment plan with the patient. The patient was provided an opportunity to ask questions and all were answered. The patient agreed with the plan and demonstrated an understanding of the instructions.   The patient was advised to call back or seek an in-person evaluation if the symptoms worsen or if the condition fails to improve as anticipated.  I provided 60 minutes of non-face-to-face time during this encounter.   Maureen Hipp, LCSW    THERAPIST PROGRESS NOTE  Session Time: 1000  Participation Level: Active  Behavioral Response: MeticulousAlertAnxious  Type of Therapy: Individual Therapy  Treatment Goals addressed: Coping  Interventions: Supportive  Summary: Maureen Randall is a 54 y.o. female who presents with continued symptoms related to her diagnosis. Maureen Randall reports doing well since our last session, but noted ongoing pain symptoms associated with her most recent surgery. Maureen Randall reports her pain level is much more intense than she expected it to be, and the surgery ended up being a more invasive procedure than she was informed of. LCSW validated Maureen Randall's feelings and recognized how difficult it can be to have different outcomes than anticipated. LCSW held space for Maureen Randall to vent her frustrations and thoughts around the surgery and offered insight where appropriate. We discussed ways to challenge anxious thoughts associated with her surgery and subsequent financial worries. We reviewed ways to stay in the present moment and not worry about the future by  utilizing mindfulness and CBT skills. Maureen Randall expressed understanding and agreement.    Suicidal/Homicidal: No  Therapist Response: Maureen Randall continues to work towards her tx goals but has not yet reached them. We will continue to work on improving emotional regulation and distress tolerance moving forward.  Plan: Return again in 2 weeks.  Diagnosis: Axis I: Generalized Anxiety Disorder    Axis II: No diagnosis    Maureen Hipp, LCSW 10/06/2019

## 2019-10-06 NOTE — Telephone Encounter (Signed)
Pt called in and said every since surgery the medication omeprazole is not working. She has been eating antacids like candy and having to drink gingerale to burp. It started before the surgery but ever since it has gotten worse.  She wants to know if she can take more than prescribed? She said she is not taking celebrex right now also. She would like a call back.

## 2019-10-07 NOTE — Telephone Encounter (Signed)
Please advise 

## 2019-10-11 ENCOUNTER — Other Ambulatory Visit: Payer: Self-pay | Admitting: Internal Medicine

## 2019-10-11 DIAGNOSIS — K219 Gastro-esophageal reflux disease without esophagitis: Secondary | ICD-10-CM

## 2019-10-11 MED ORDER — OMEPRAZOLE 40 MG PO CPDR: 40.0000 mg | DELAYED_RELEASE_CAPSULE | Freq: Every day | ORAL | 3 refills | Status: DC

## 2019-10-11 NOTE — Telephone Encounter (Signed)
She can take 40 mg omeprazole 30 minutes before a meal sent higher dose to [facility] in mean time can take 2 of 20 mg =40 mg   If sx's continue rec she see GI for consult   [place]

## 2019-10-12 NOTE — Telephone Encounter (Signed)
Patient informed and verbalized understanding.   Appointment for 4/07 at 2 30 changed to a virtual visit. Patient recently had surgery and unable to drive. States her husband will not be available to bring her in. For your information

## 2019-10-14 ENCOUNTER — Encounter: Payer: Self-pay | Admitting: Internal Medicine

## 2019-10-14 ENCOUNTER — Telehealth: Payer: 59 | Admitting: Internal Medicine

## 2019-10-14 VITALS — Ht 67.0 in | Wt 215.0 lb

## 2019-10-14 DIAGNOSIS — K219 Gastro-esophageal reflux disease without esophagitis: Secondary | ICD-10-CM

## 2019-10-14 DIAGNOSIS — E785 Hyperlipidemia, unspecified: Secondary | ICD-10-CM

## 2019-10-14 NOTE — Patient Instructions (Signed)
COVID-19 Vaccine Information can be found at: [email or web address].[email or web address] For questions related to vaccine distribution or appointments, please email [email or web address] or call [phone].    High Cholesterol  High cholesterol is a condition in which the blood has high levels of a white, waxy, fat-like substance (cholesterol). The human body needs small amounts of cholesterol. The liver makes all the cholesterol that the body needs. Extra (excess) cholesterol comes from the food that we eat. Cholesterol is carried from the liver by the blood through the blood vessels. If you have high cholesterol, deposits (plaques) may build up on the walls of your blood vessels (arteries). Plaques make the arteries narrower and stiffer. Cholesterol plaques increase your risk for heart attack and stroke. Work with your health care provider to keep your cholesterol levels in a healthy range. What increases the risk? This condition is more likely to develop in people who:  Eat foods that are high in animal fat (saturated fat) or cholesterol.  Are overweight.  Are not getting enough exercise.  Have a family history of high cholesterol. What are the signs or symptoms? There are no symptoms of this condition. How is this diagnosed? This condition may be diagnosed from the results of a blood test.  If you are older than age 59, your health care provider may check your cholesterol every 4-6 years.  You may be checked more often if you already have high cholesterol or other risk factors for heart disease. The blood test for cholesterol measures:  "Bad" cholesterol (LDL cholesterol). This is the main type of cholesterol that causes heart disease. The desired level for LDL is less than 100.  "Good" cholesterol (HDL cholesterol). This type helps to protect against heart disease by cleaning the arteries and carrying the LDL away. The desired level for HDL is  60 or higher.  Triglycerides. These are fats that the body can store or burn for energy. The desired number for triglycerides is lower than 150.  Total cholesterol. This is a measure of the total amount of cholesterol in your blood, including LDL cholesterol, HDL cholesterol, and triglycerides. A healthy number is less than 200. How is this treated? This condition is treated with diet changes, lifestyle changes, and medicines. Diet changes  This may include eating more whole grains, fruits, vegetables, nuts, and fish.  This may also include cutting back on red meat and foods that have a lot of added sugar. Lifestyle changes  Changes may include getting at least 40 minutes of aerobic exercise 3 times a week. Aerobic exercises include walking, biking, and swimming. Aerobic exercise along with a healthy diet can help you maintain a healthy weight.  Changes may also include quitting smoking. Medicines  Medicines are usually given if diet and lifestyle changes have failed to reduce your cholesterol to healthy levels.  Your health care provider may prescribe a statin medicine. Statin medicines have been shown to reduce cholesterol, which can reduce the risk of heart disease. Follow these instructions at home: Eating and drinking If told by your health care provider:  Eat chicken (without skin), fish, veal, shellfish, ground [place] breast, and round or loin cuts of red meat.  Do not eat fried foods or fatty meats, such as hot dogs and salami.  Eat plenty of fruits, such as apples.  Eat plenty of vegetables, such as broccoli, potatoes, and carrots.  Eat beans, peas, and lentils.  Eat grains such as barley, rice, couscous, and bulgur wheat.  Eat pasta without cream sauces.  Use  skim or nonfat milk, and eat low-fat or nonfat yogurt and cheeses.  Do not eat or drink whole milk, cream, ice cream, egg yolks, or hard cheeses.  Do not eat stick margarine or tub margarines that contain  trans fats (also called partially hydrogenated oils).  Do not eat saturated tropical oils, such as coconut oil and palm oil.  Do not eat cakes, cookies, crackers, or other baked goods that contain trans fats.  General instructions  Exercise as directed by your health care provider. Increase your activity level with activities such as gardening, walking, and taking the stairs.  Take over-the-counter and prescription medicines only as told by your health care provider.  Do not use any products that contain nicotine or tobacco, such as cigarettes and e-cigarettes. If you need help quitting, ask your health care provider.  Keep all follow-up visits as told by your health care provider. This is important. Contact a health care provider if:  You are struggling to maintain a healthy diet or weight.  You need help to start on an exercise program.  You need help to stop smoking. Get help right away if:  You have chest pain.  You have trouble breathing. This information is not intended to replace advice given to you by your health care provider. Make sure you discuss any questions you have with your health care provider. Document Revised: 06/28/2017 Document Reviewed: 12/24/2015 Elsevier Patient Education  [place].  Cholesterol Content in Foods Cholesterol is a waxy, fat-like substance that helps to carry fat in the blood. The body needs cholesterol in small amounts, but too much cholesterol can cause damage to the arteries and heart. Most people should eat less than 200 milligrams (mg) of cholesterol a day. Foods with cholesterol  Cholesterol is found in animal-based foods, such as meat, seafood, and dairy. Generally, low-fat dairy and lean meats have less cholesterol than full-fat dairy and fatty meats. The milligrams of cholesterol per serving (mg per serving) of common cholesterol-containing foods are listed below. Meat and other proteins  Egg - one large whole egg has 186  mg.  Veal shank - 4 oz has 141 mg.  Lean ground [place] (93% lean) - 4 oz has 118 mg.  Fat-trimmed lamb loin - 4 oz has 106 mg.  Lean ground beef (90% lean) - 4 oz has 100 mg.  Lobster - 3.5 oz has 90 mg.  Pork loin chops - 4 oz has 86 mg.  Canned salmon - 3.5 oz has 83 mg.  Fat-trimmed beef top loin - 4 oz has 78 mg.  Frankfurter - 1 frank (3.5 oz) has 77 mg.  Crab - 3.5 oz has 71 mg.  Roasted chicken without skin, white meat - 4 oz has 66 mg.  Light bologna - 2 oz has 45 mg.  Deli-cut [place] - 2 oz has 31 mg.  Canned tuna - 3.5 oz has 31 mg.  Bacon - 1 oz has 29 mg.  Oysters and mussels (raw) - 3.5 oz has 25 mg.  Mackerel - 1 oz has 22 mg.  Trout - 1 oz has 20 mg.  Pork sausage - 1 link (1 oz) has 17 mg.  Salmon - 1 oz has 16 mg.  Tilapia - 1 oz has 14 mg. Dairy  Soft-serve ice cream -  cup (4 oz) has 103 mg.  Whole-milk yogurt - 1 cup (8 oz) has 29 mg.  Cheddar cheese - 1 oz has 28 mg.  American cheese - 1  oz has 28 mg.  Whole milk - 1 cup (8 oz) has 23 mg.  2% milk - 1 cup (8 oz) has 18 mg.  Cream cheese - 1 tablespoon (Tbsp) has 15 mg.  Cottage cheese -  cup (4 oz) has 14 mg.  Low-fat (1%) milk - 1 cup (8 oz) has 10 mg.  Sour cream - 1 Tbsp has 8.5 mg.  Low-fat yogurt - 1 cup (8 oz) has 8 mg.  Nonfat Greek yogurt - 1 cup (8 oz) has 7 mg.  Half-and-half cream - 1 Tbsp has 5 mg. Fats and oils  Cod liver oil - 1 tablespoon (Tbsp) has 82 mg.  Butter - 1 Tbsp has 15 mg.  Lard - 1 Tbsp has 14 mg.  Bacon grease - 1 Tbsp has 14 mg.  Mayonnaise - 1 Tbsp has 5-10 mg.  Margarine - 1 Tbsp has 3-10 mg. Exact amounts of cholesterol in these foods may vary depending on specific ingredients and brands. Foods without cholesterol Most plant-based foods do not have cholesterol unless you combine them with a food that has cholesterol. Foods without cholesterol include:  Grains and cereals.  Vegetables.  Fruits.  Vegetable oils, such as  olive, canola, and sunflower oil.  Legumes, such as peas, beans, and lentils.  Nuts and seeds.  Egg whites. Summary  The body needs cholesterol in small amounts, but too much cholesterol can cause damage to the arteries and heart.  Most people should eat less than 200 milligrams (mg) of cholesterol a day. This information is not intended to replace advice given to you by your health care provider. Make sure you discuss any questions you have with your health care provider. Document Revised: 06/07/2017 Document Reviewed: 02/19/2017 Elsevier Patient Education  [place].

## 2019-10-14 NOTE — Progress Notes (Signed)
Virtual Visit via Video Note  I connected with Maureen Randall  on 10/14/19 at  3:30 PM EDT by a video enabled telemedicine application and verified that I am speaking with the correct person using two identifiers.  Location patient: home Location provider:work or home office Persons participating in the virtual visit: patient, provider  I discussed the limitations of evaluation and management by telemedicine and the availability of in person appointments. The patient expressed understanding and agreed to proceed.   HPI: Visit cancelled pt left call after 30 minutes and unable to reach x 3 x will reschedule    ROS: See pertinent positives and negatives per HPI.  Past Medical History:  Diagnosis Date  . Anxiety   . Arthritis    osteoarthritis  . Asthma   . DVT (deep venous thrombosis) ([place]) 2012   right leg. after surgery, while on BCP  . GERD (gastroesophageal reflux disease)     Past Surgical History:  Procedure Laterality Date  . ABLATION     veins in lower legs  . BREAST BIOPSY Left 2011   neg  . CARPAL TUNNEL RELEASE Right 12/2015   [facility]  . CARPAL TUNNEL RELEASE Left 12/2016  . FOOT SURGERY Right 2015   broken navicular bone  . HIP ARTHROSCOPY Right 05/2011  . HIP SURGERY     left hip surgery 05/2018  . NASAL TURBINATE REDUCTION Bilateral 06/12/2019   Procedure: TURBINATE REDUCTION/SUBMUCOSAL RESECTION;  Surgeon: [name], MD;  Location: [facility];  Service: ENT;  Laterality: Bilateral;  . 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 ROTATOR CUFF REPAIR     Dr. [name] in 04/2019   . SEPTOPLASTY Bilateral 06/12/2019   Procedure: SEPTOPLASTY;  Surgeon: [name], MD;  Location: [facility];  Service: ENT;  Laterality: Bilateral;  . SHOULDER SURGERY Right 12/2015   [facility] torn rotator cuff  . SPINAL FUSION  2016   [facility] lumbar    Family History  Problem Relation Age of Onset   . Breast cancer Maternal Aunt   . Breast cancer Paternal Aunt   . Dementia Paternal Aunt   . COPD Mother        quit smoking after 60 years  . Dementia Father        symptoms started in his 4s  . Rheum arthritis Other        FH  . Dementia Other        "all my dad's side of the family"  . Other Maternal Grandmother        got dementia as she got older   . Dementia Paternal Grandmother     SOCIAL HX: lives at home   Current Outpatient Medications:  .  baclofen (LIORESAL) 10 MG tablet, Take 10 mg by mouth 3 (three) times daily as needed for muscle spasms., Disp: , Rfl:  .  omeprazole (PRILOSEC) 40 MG capsule, Take 1 capsule (40 mg total) by mouth daily. 30 min before food, Disp: 90 capsule, Rfl: 3 .  oxyCODONE-acetaminophen (PERCOCET) 10-325 MG tablet, Take 1 tablet by mouth every 6 (six) hours as needed., Disp: , Rfl:  .  albuterol (VENTOLIN HFA) 108 (90 Base) MCG/ACT inhaler, Inhale 2 puffs into the lungs every 6 (six) hours as needed for wheezing or shortness of breath. (Patient not taking: Reported on 10/14/2019), Disp: 18 g, Rfl: 2 .  budesonide-formoterol (SYMBICORT) 160-4.5 MCG/ACT inhaler, 2 puffs twice daily. (Patient not  taking: Reported on 10/14/2019), Disp: 1 Inhaler, Rfl: 5 .  busPIRone (BUSPAR) 10 MG tablet, Take 1 tablet (10 mg total) by mouth 2 (two) times daily. (Patient not taking: Reported on 10/14/2019), Disp: 180 tablet, Rfl: 0 .  celecoxib (CELEBREX) 100 MG capsule, Take 1 capsule (100 mg total) by mouth daily. (Patient not taking: Reported on 10/14/2019), Disp: 90 capsule, Rfl: 3 .  cyclobenzaprine (FLEXERIL) 10 MG tablet, cyclobenzaprine 10 mg tablet  TK 1 T PO Q 6-8 H PRF SPASM, Disp: , Rfl:  .  diclofenac Sodium (VOLTAREN) 1 % GEL, Apply 2-4 g topically 4 (four) times daily. 2 gram upper body qid prn and 4 gram lower body qid prn (Patient not taking: Reported on 10/14/2019), Disp: 100 g, Rfl: 11 .  DULoxetine (CYMBALTA) 60 MG capsule, Take 1 capsule (60 mg total) by  mouth daily. (Patient not taking: Reported on 10/14/2019), Disp: 90 capsule, Rfl: 1 .  Elastic Bandages & Supports (RELIEF KNEE) MISC, Apply in the morning and remove at night., Disp: , Rfl:  .  estradiol (ESTRACE) 0.1 MG/GM vaginal cream, INSERT PEA SIZED AMOUNT (0.5G) INTO VAGINA NIGHTLY EVERY OTHER NIGHT FOR 2 WEEKS, THEN TWICE WEEKLY, Disp: , Rfl:  .  fluconazole (DIFLUCAN) 200 MG tablet, Take 200 mg by mouth daily., Disp: , Rfl:  .  gabapentin (NEURONTIN) 300 MG capsule, Take 300 mg by mouth 2 (two) times daily., Disp: , Rfl:  .  ibuprofen (ADVIL,MOTRIN) 800 MG tablet, TK 1 T PO Q 8 H WF PRN P, Disp: , Rfl: 2 .  methocarbamol (ROBAXIN) 500 MG tablet, Take 2 tablets (1,000 mg total) by mouth 2 (two) times daily as needed for muscle spasms. (Patient not taking: Reported on 10/14/2019), Disp: 60 tablet, Rfl: 2 .  nitrofurantoin (MACRODANTIN) 50 MG capsule, Take 1 capsule (50 mg total) by mouth as needed. UTI dose should be 100 mg bid x 5 days UTI  or 50-100 mg qhs prevention (Patient not taking: Reported on 10/14/2019), Disp: 120 capsule, Rfl: 3 .  phentermine (ADIPEX-P) 37.5 MG tablet, Take 1 tablet (37.5 mg total) by mouth daily before breakfast. (Patient not taking: Reported on 10/14/2019), Disp: 60 tablet, Rfl: 0 .  traZODone (DESYREL) 100 MG tablet, TAKE 1-1.5 TABLETS (100-150 MG TOTAL) BY MOUTH AT BEDTIME AS NEEDED FOR SLEEP. (Patient not taking: Reported on 10/14/2019), Disp: 135 tablet, Rfl: 1 .  valACYclovir (VALTREX) 1000 MG tablet, Oral blister, Disp: , Rfl:   EXAM:  VITALS per patient if applicable:  GENERAL: alert, oriented, appears well and in no acute distress  HEENT: atraumatic, conjunttiva clear, no obvious abnormalities on inspection of external nose and ears  NECK: normal movements of the head and neck  LUNGS: on inspection no signs of respiratory distress, breathing rate appears normal, no obvious gross SOB, gasping or wheezing  CV: no obvious cyanosis  MS: moves all visible  extremities without noticeable abnormality  PSYCH/NEURO: pleasant and cooperative, no obvious depression or anxiety, speech and thought processing grossly intact  ASSESSMENT AND PLAN:  Discussed the following assessment and plan:  Gastroesophageal reflux disease without esophagitis  Hyperlipidemia, unspecified hyperlipidemia type   VISIT CANCELLED PT LEFT CALL AFTER 30 MINUTES WILL REACH OUT TO RESCHEDULE   HM Declines flu shot  Tdap due possibly will check [facility] Disc shingrix prev.  Consider hep B vaccine in future NR 08/31/15  HCV neg 08/31/15   Pap neg 09/11/16 neg neg HPV Will f/u Dr. Leafy Ro has IUD upcoming appt10/2020  Mammogram 08/12/19  ob/gyn ordered Dr. Horace Porteous  dexa ordered   Colonoscopy never had discussed will review at f/u  Dermatology was due 09/2018 f/u usu. Yearly [facility] Never smoker   Other providers Former PCP [facility] Dr. Garen Grams then [place] [facility] requested all imaging Xrays, MRIS, CT Pain Dr. Alvira Monday in [place] specialist Dr. Levada Schilling spine in [place] [facility]-Dr. Tami Ribas stated pt did not have allergies of note had testing Psychiatry and therapy-following  PT  Of note meds pt gets from PCP  Valtrex  adipex  Macrobid  Robaxin  voltaren gel   04/14/19 ENT Dr. Tami Ribas sinusitis deviated nasal septum right nasal endoscopy and left nasacort 55 nasal hoarse voice small hemorrhage right cord 2/2 coughing and inhalers f/u in 6 weeks after shoulder surgery   -we discussed possible serious and likely etiologies, options for evaluation and workup, limitations of telemedicine visit vs in person visit, treatment, treatment risks and precautions. Pt prefers to treat via telemedicine empirically rather then risking or undertaking an in person visit at this moment. Patient agrees to seek prompt in person care if worsening, new symptoms arise, or if is not improving with treatment.   I  discussed the assessment and treatment plan with the patient. The patient was provided an opportunity to ask questions and all were answered. The patient agreed with the plan and demonstrated an understanding of the instructions.   The patient was advised to call back or seek an in-person evaluation if the symptoms worsen or if the condition fails to improve as anticipated.   Nino Glow McLean-Scocuzza, MD

## 2019-10-17 ENCOUNTER — Ambulatory Visit: Payer: 59 | Attending: Internal Medicine

## 2019-10-17 DIAGNOSIS — Z23 Encounter for immunization: Secondary | ICD-10-CM

## 2019-10-17 NOTE — Progress Notes (Signed)
   Covid-19 Vaccination Clinic  Name:  Maureen Randall    MRN: [id number] DOB: 08/27/1965  10/17/2019  [name] was observed post Covid-19 immunization for 15 minutes without incident. She was provided with Vaccine Information Sheet and instruction to access the V-Safe system.   [name] was instructed to call 911 with any severe reactions post vaccine: [name] Difficulty breathing  . Swelling of face and throat  . A fast heartbeat  . A bad rash all over body  . Dizziness and weakness   Immunizations Administered    Name Date Dose VIS Date Route   Pfizer COVID-19 Vaccine 10/17/2019 10:02 AM 0.3 mL 06/19/2019 Intramuscular   Manufacturer: [place]   Lot: [id number]   [place]: [id number]

## 2019-10-20 ENCOUNTER — Ambulatory Visit: Payer: 59

## 2019-10-20 ENCOUNTER — Other Ambulatory Visit: Payer: Self-pay

## 2019-10-21 ENCOUNTER — Other Ambulatory Visit: Payer: Self-pay

## 2019-10-21 ENCOUNTER — Ambulatory Visit (INDEPENDENT_AMBULATORY_CARE_PROVIDER_SITE_OTHER): Payer: 59 | Admitting: Licensed Clinical Social Worker

## 2019-10-21 ENCOUNTER — Encounter: Payer: Self-pay | Admitting: Licensed Clinical Social Worker

## 2019-10-21 DIAGNOSIS — F411 Generalized anxiety disorder: Secondary | ICD-10-CM | POA: Diagnosis not present

## 2019-10-21 NOTE — Progress Notes (Signed)
Virtual Visit via Telephone Note  I connected with Maureen Randall on 10/21/19 at  9:00 AM EDT by telephone and verified that I am speaking with the correct person using two identifiers.   I discussed the limitations, risks, security and privacy concerns of performing an evaluation and management service by telephone and the availability of in person appointments. I also discussed with the patient that there may be a patient responsible charge related to this service. The patient expressed understanding and agreed to proceed.    I discussed the assessment and treatment plan with the patient. The patient was provided an opportunity to ask questions and all were answered. The patient agreed with the plan and demonstrated an understanding of the instructions.   The patient was advised to call back or seek an in-person evaluation if the symptoms worsen or if the condition fails to improve as anticipated.  I provided 45 minutes of non-face-to-face time during this encounter.   Alden Hipp, LCSW    THERAPIST PROGRESS NOTE  Session Time: 0900  Participation Level: Active  Behavioral Response: NeatAlertAnxious  Type of Therapy: Individual Therapy  Treatment Goals addressed: Coping  Interventions: CBT  Summary: Maureen Randall is a 54 y.o. female who presents with continued symptoms related to her diagnosis. Maureen Randall reports doing well since our last session. She reports ongoing pain and anxiety associated with her most recent surgery, and noted her pain got better but has since gotten much worse. Maureen Randall reported she has contacted her doctor's office several times and advocated for herself, but feels she was essentially brushed off each time she contacted the office. LCSW validated Maureen Randall's feelings of anxiety and frustration around the situation, and held space for her to discuss other aspects of the situation that have impacted her recently. LCSW encouraged Maureen Randall to continue advocating for  herself, and to continue utilizing CBT skills to manage anxious/negative thoughts. Maureen Randall expressed understanding and agreement with all information presented.   Suicidal/Homicidal: No  Therapist Response: Maureen Randall continues to work towards her tx goals but has not yet reached them. We will continue to work on improving emotional regulation skills and distress tolerance moving forward.   Plan: Return again in 4 weeks.  Diagnosis: Axis I: Generalized Anxiety Disorder    Axis II: No diagnosis    Alden Hipp, LCSW 10/21/2019

## 2019-10-28 ENCOUNTER — Other Ambulatory Visit: Payer: Self-pay

## 2019-10-28 ENCOUNTER — Ambulatory Visit (INDEPENDENT_AMBULATORY_CARE_PROVIDER_SITE_OTHER): Payer: 59

## 2019-10-28 DIAGNOSIS — R4789 Other speech disturbances: Secondary | ICD-10-CM

## 2019-10-28 DIAGNOSIS — R413 Other amnesia: Secondary | ICD-10-CM | POA: Diagnosis not present

## 2019-10-28 MED ORDER — GADOBENATE DIMEGLUMINE 529 MG/ML IV SOLN
20.0000 mL | Freq: Once | INTRAVENOUS | Status: AC | PRN
  Administered 2019-10-28: 20 mL via INTRAVENOUS

## 2019-11-05 ENCOUNTER — Other Ambulatory Visit: Payer: Self-pay

## 2019-11-05 ENCOUNTER — Telehealth (INDEPENDENT_AMBULATORY_CARE_PROVIDER_SITE_OTHER): Payer: 59 | Admitting: Psychiatry

## 2019-11-05 ENCOUNTER — Encounter: Payer: Self-pay | Admitting: Psychiatry

## 2019-11-05 DIAGNOSIS — F411 Generalized anxiety disorder: Secondary | ICD-10-CM

## 2019-11-05 DIAGNOSIS — G4701 Insomnia due to medical condition: Secondary | ICD-10-CM | POA: Diagnosis not present

## 2019-11-05 NOTE — Progress Notes (Signed)
Provider Location : [facility] Patient Location : Home  Virtual Visit via Video Note  I connected with Maureen Randall on 11/05/19 at 11:00 AM EDT by a video enabled telemedicine application and verified that I am speaking with the correct person using two identifiers.   I discussed the limitations of evaluation and management by telemedicine and the availability of in person appointments. The patient expressed understanding and agreed to proceed.    I discussed the assessment and treatment plan with the patient. The patient was provided an opportunity to ask questions and all were answered. The patient agreed with the plan and demonstrated an understanding of the instructions.   The patient was advised to call back or seek an in-person evaluation if the symptoms worsen or if the condition fails to improve as anticipated.   [name] MD OP Progress Note  11/05/2019 6:21 PM Maureen Randall  MRN:  [id number]  Chief Complaint:  Chief Complaint    Follow-up     HPI: Infinity is a 54 year old Caucasian female, has a history of GAD, insomnia, chronic pain, primary osteoarthritis, lumbar radiculopathy, degenerative disc disease, spondylolisthesis, bilateral carpal tunnel syndrome, recent surgery of her back, was evaluated by telemedicine today.  Patient today reports she had her surgery few weeks ago.  She reports she continues to be in pain.  She reports she also has a lot of abdominal discomfort since the surgery.  She reports she was advised to stop all her medications except for sleep medication and baclofen and omeprazole.  Those are the only medication she is taking right now.  She is anxious about her recovery however reports she is coping okay.  She continues to be in pain which does make her restless off and on.  She does report sleep is restless due to the pain.  She is often unable to find a comfortable position.  She however reports trazodone helps to some extent.  Patient reports she is trying to  cope with her stressors as best as she can.  She reports she had her follow-up sessions with her therapist which helped.  Patient denies any suicidality, homicidality or perceptual disturbances.  Patient denies any other concerns today. Visit Diagnosis:    ICD-10-CM   1. GAD (generalized anxiety disorder)  F41.1   2. 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  Past trials of Xanax, diazepam, clonazepam, Pamelor.  Past Medical History:  Past Medical History:  Diagnosis Date  . Anxiety   . Arthritis    osteoarthritis  . Asthma   . DVT (deep venous thrombosis) ([name]) 2012   right leg. after surgery, while on BCP  . GERD (gastroesophageal reflux disease)     Past Surgical History:  Procedure Laterality Date  . ABLATION     veins in lower legs  . BREAST BIOPSY Left 2011   neg  . CARPAL TUNNEL RELEASE Right 12/2015   [facility]  . CARPAL TUNNEL RELEASE Left 12/2016  . FOOT SURGERY Right 2015   broken navicular bone  . HIP ARTHROSCOPY Right 05/2011  . HIP SURGERY     left hip surgery 05/2018  . NASAL TURBINATE REDUCTION Bilateral 06/12/2019   Procedure: TURBINATE REDUCTION/SUBMUCOSAL RESECTION;  Surgeon: [name], MD;  Location: [facility];  Service: ENT;  Laterality: Bilateral;  . REPLACEMENT TOTAL HIP W/  RESURFACING IMPLANTS Right 12/2017   [facility]  .  ROTATOR CUFF REPAIR     Dr. [name] in 04/2019   . SEPTOPLASTY Bilateral 06/12/2019   Procedure: SEPTOPLASTY;  Surgeon: [name], MD;  Location: [facility];  Service: ENT;  Laterality: Bilateral;  . SHOULDER SURGERY Right 12/2015   [facility] torn rotator cuff  . SPINAL FUSION  2016   [facility] lumbar    Family Psychiatric History: I have reviewed family psychiatric history from my progress note on 04/30/2018.  Family History:  Family History  Problem Relation Age of  Onset  . Breast cancer Maternal Aunt   . Breast cancer Paternal Aunt   . Dementia Paternal Aunt   . COPD Mother        quit smoking after 60 years  . Dementia Father        symptoms started in his 20s  . Rheum arthritis Other        FH  . Dementia Other        "all my dad's side of the family"  . Other Maternal Grandmother        got dementia as she got older   . Dementia Paternal Grandmother     Social History: Reviewed social history from my progress note on 04/30/2018. Social History   Socioeconomic History  . Marital status: Married    Spouse name: fredrick  . Number of children: 1  . Years of education: Not on file  . Highest education level: High school graduate  Occupational History    Comment: full time  Tobacco Use  . Smoking status: Never Smoker  . Smokeless tobacco: Never Used  Substance and Sexual Activity  . Alcohol use: Not Currently    Alcohol/week: 1.0 standard drinks    Types: 1 Cans of beer per week    Comment: may have drink 1x/month  . Drug use: Never  . Sexual activity: Yes  Other Topics Concern  . 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 Difficulty of Paying Living Expenses:   Food Insecurity:   . Worried About Charity fundraiser in the Last Year:   . Arboriculturist in the Last Year:   Transportation Needs:   . Film/video editor (Medical):   [name] Lack of Transportation (Non-Medical):   Physical Activity:   . Days of Exercise per Week:   . Minutes of Exercise per Session:   Stress:   . Feeling of Stress :   Social Connections:   . Frequency of Communication with Friends and Family:   . Frequency of Social Gatherings with Friends and Family:   . Attends Religious Services:   . Active Member of Clubs or  Organizations:   . Attends Archivist Meetings:   [name] Marital Status:     Allergies:  Allergies  Allergen Reactions  . Amoxicillin Hives  . Septra [Sulfamethoxazole-Trimethoprim] Hives       . Zanaflex [Tizanidine]     Weak muscles   . Cephalexin Rash         Metabolic Disorder Labs: Lab Results  Component Value Date   HGBA1C 5.5 09/25/2019   No results found for: PROLACTIN Lab Results  Component Value Date   CHOL 217 (H) 09/25/2019  TRIG 81.0 09/25/2019   HDL 69.10 09/25/2019   CHOLHDL 3 09/25/2019   VLDL 16.2 09/25/2019   LDLCALC 132 (H) 09/25/2019   LDLCALC 156 (H) 01/05/2019   Lab Results  Component Value Date   TSH 3.78 01/05/2019    Therapeutic Level Labs: No results found for: LITHIUM No results found for: VALPROATE No components found for:  CBMZ  Current Medications: Current Outpatient Medications  Medication Sig Dispense Refill  . albuterol (VENTOLIN HFA) 108 (90 Base) MCG/ACT inhaler Inhale 2 puffs into the lungs every 6 (six) hours as needed for wheezing or shortness of breath. (Patient not taking: Reported on 10/14/2019) 18 g 2  . baclofen (LIORESAL) 10 MG tablet Take 10 mg by mouth 3 (three) times daily as needed for muscle spasms.    . budesonide-formoterol (SYMBICORT) 160-4.5 MCG/ACT inhaler 2 puffs twice daily. (Patient not taking: Reported on 10/14/2019) 1 Inhaler 5  . busPIRone (BUSPAR) 10 MG tablet Take 1 tablet (10 mg total) by mouth 2 (two) times daily. (Patient not taking: Reported on 10/14/2019) 180 tablet 0  . celecoxib (CELEBREX) 100 MG capsule Take 1 capsule (100 mg total) by mouth daily. (Patient not taking: Reported on 10/14/2019) 90 capsule 3  . cyclobenzaprine (FLEXERIL) 10 MG tablet cyclobenzaprine 10 mg tablet  TK 1 T PO Q 6-8 H PRF SPASM    . diclofenac Sodium (VOLTAREN) 1 % GEL Apply 2-4 g topically 4 (four) times daily. 2 gram upper body qid prn and 4 gram lower body qid prn (Patient not taking: Reported on 10/14/2019) 100 g 11   . DULoxetine (CYMBALTA) 60 MG capsule Take 1 capsule (60 mg total) by mouth daily. (Patient not taking: Reported on 10/14/2019) 90 capsule 1  . Elastic Bandages & Supports (RELIEF KNEE) MISC Apply in the morning and remove at night.    . estradiol (ESTRACE) 0.1 MG/GM vaginal cream INSERT PEA SIZED AMOUNT (0.5G) INTO VAGINA NIGHTLY EVERY OTHER NIGHT FOR 2 WEEKS, THEN TWICE WEEKLY    . fluconazole (DIFLUCAN) 200 MG tablet Take 200 mg by mouth daily.    [name] gabapentin (NEURONTIN) 300 MG capsule Take 300 mg by mouth 2 (two) times daily.    [name] ibuprofen (ADVIL,MOTRIN) 800 MG tablet TK 1 T PO Q 8 H WF PRN P  2  . methocarbamol (ROBAXIN) 500 MG tablet Take 2 tablets (1,000 mg total) by mouth 2 (two) times daily as needed for muscle spasms. (Patient not taking: Reported on 10/14/2019) 60 tablet 2  . nitrofurantoin (MACRODANTIN) 50 MG capsule Take 1 capsule (50 mg total) by mouth as needed. UTI dose should be 100 mg bid x 5 days UTI  or 50-100 mg qhs prevention (Patient not taking: Reported on 10/14/2019) 120 capsule 3  . omeprazole (PRILOSEC) 40 MG capsule Take 1 capsule (40 mg total) by mouth daily. 30 min before food 90 capsule 3  . oxyCODONE-acetaminophen (PERCOCET) 10-325 MG tablet Take 1 tablet by mouth every 6 (six) hours as needed.    . phentermine (ADIPEX-P) 37.5 MG tablet Take 1 tablet (37.5 mg total) by mouth daily before breakfast. (Patient not taking: Reported on 10/14/2019) 60 tablet 0  . traZODone (DESYREL) 100 MG tablet TAKE 1-1.5 TABLETS (100-150 MG TOTAL) BY MOUTH AT BEDTIME AS NEEDED FOR SLEEP. (Patient not taking: Reported on 10/14/2019) 135 tablet 1  . valACYclovir (VALTREX) 1000 MG tablet Oral blister     No current facility-administered medications for this visit.     Musculoskeletal: Strength & Muscle Tone: UTA  Gait & Station: Observed as seated Patient leans: N/A  Psychiatric Specialty Exam: Review of Systems  Musculoskeletal: Positive for back pain.  Psychiatric/Behavioral: Positive  for sleep disturbance (due to pain). The patient is nervous/anxious.   All other systems reviewed and are negative.   There were no vitals taken for this visit.There is no height or weight on file to calculate BMI.  General Appearance: Casual  Eye Contact:  Fair  Speech:  Normal Rate  Volume:  Normal  Mood:  Anxious Coping ok  Affect:  Congruent  Thought Process:  Goal Directed and Descriptions of Associations: Intact  Orientation:  Full (Time, Place, and Person)  Thought Content: Logical   Suicidal Thoughts:  No  Homicidal Thoughts:  No  Memory:  Immediate;   Fair Recent;   Fair Remote;   Fair  Judgement:  Fair  Insight:  Fair  Psychomotor Activity:  Normal  Concentration:  Concentration: Fair and Attention Span: Fair  Recall:  [employer] of Knowledge: Fair  Language: Fair  Akathisia:  No  Handed:  Right  AIMS (if indicated): UTA  Assets:  Communication Skills Desire for Improvement Housing Social Support  ADL's:  Intact  Cognition: WNL  Sleep:  restlessness   Screenings: GAD-7     Video Visit from 10/14/2019 in [facility]  Total GAD-7 Score  1    Mini-Mental     Office Visit from 09/23/2019 in [facility]  Total Score (max 30 points )  27    PHQ2-9     Video Visit from 10/14/2019 in [facility] Office Visit from 08/14/2019 in [facility] Office Visit from 07/07/2019 in [facility] Office Visit from 03/13/2019 in [facility] from 02/11/2018 in [facility]  PHQ-2 Total Score  0  0  0  0  0  PHQ-9 Total Score  --  0  --  0  --       Assessment and Plan: Estel is a 54 year old Caucasian female, married, employed, currently on long-term disability, lives in [place], has a history of GAD, insomnia, chronic pain, osteoarthritis, degenerative disc disease, lumbar radiculopathy, bilateral carpal  tunnel syndrome, multiple surgeries was evaluated by telemedicine today.  Patient with psychosocial stressors of chronic pain, recent back surgery, history of multiple surgeries in the past continues to struggle with anxiety and sleep issues which are more so because of her health issues and her pain.  Patient however is coping okay.  Patient has been noncompliant with medications since she was asked by her providers to hold off on all medications including her antidepressant medications after surgery for a few weeks.  Patient however is coping okay.  Plan as noted below.  Plan GAD-stable Continue Cymbalta 60 mg p.o. daily.  She is currently noncompliant since she was asked to hold it off after her back surgery by her orthopedic provider.  Patient however reports she will restart taking it in a few weeks. BuSpar 10 mg p.o. twice daily.  Currently noncompliant however will restart in a few weeks. Hydroxyzine 25 to 50 mg as needed for anxiety attacks Continue CBT.  She is aware she needs to establish care with a new therapist since her therapist has left the practice.  Insomnia-restless due to pain Trazodone 100 to 150 mg p.o. nightly as needed   Follow-up in clinic in 2 months or sooner if needed.  I have spent atleast  15 minutes non face to face with patient today. More than 50 % of the time was spent for preparing to see the patient ( e.g., review of test, records ),ordering medications and test ,psychoeducation and supportive psychotherapy and care coordination,as well as documenting clinical information in electronic health record. This note was generated in part or whole with voice recognition software. Voice recognition is usually quite accurate but there are transcription errors that can and very often do occur. I apologize for any typographical errors that were not detected and corrected.      Ursula Alert, MD 11/05/2019, 6:21 PM

## 2019-11-10 ENCOUNTER — Ambulatory Visit: Payer: 59 | Attending: Internal Medicine

## 2019-11-10 DIAGNOSIS — Z23 Encounter for immunization: Secondary | ICD-10-CM

## 2019-11-10 NOTE — Progress Notes (Signed)
   Covid-19 Vaccination Clinic  Name:  Maureen Randall    MRN: [id number] DOB: [birth year]  11/10/2019  Maureen Randall was observed post Covid-19 immunization for 15 minutes without incident. She was provided with Vaccine Information Sheet and instruction to access the V-Safe system.   Maureen Randall was instructed to call 911 with any severe reactions post vaccine: [name] Difficulty breathing  . Swelling of face and throat  . A fast heartbeat  . A bad rash all over body  . Dizziness and weakness   Immunizations Administered    Name Date Dose VIS Date Route   Pfizer COVID-19 Vaccine 11/10/2019 11:52 AM 0.3 mL 09/02/2018 Intramuscular   Manufacturer: [place]   Lot: [id number]   [place]: [id number]

## 2019-11-18 ENCOUNTER — Telehealth: Payer: Self-pay | Admitting: Internal Medicine

## 2019-11-18 ENCOUNTER — Telehealth: Payer: 59 | Admitting: Internal Medicine

## 2019-11-18 NOTE — Telephone Encounter (Signed)
Patient had a virtual appointment. Left message to return call twice and sent the link to join twice to the number on file.   Patient no-showed today's appointment; appointment was for 05/12 at 11:00 am, provider notified for review of record.  Mychart message sent to reschedule.

## 2019-11-24 NOTE — Progress Notes (Signed)
Pt unable to be reached for appt   [place]

## 2019-12-08 ENCOUNTER — Other Ambulatory Visit: Payer: Self-pay | Admitting: Internal Medicine

## 2019-12-08 ENCOUNTER — Telehealth: Payer: Self-pay | Admitting: Internal Medicine

## 2019-12-08 DIAGNOSIS — E669 Obesity, unspecified: Secondary | ICD-10-CM

## 2019-12-08 MED ORDER — PHENTERMINE HCL 37.5 MG PO TABS: 37.5000 mg | ORAL_TABLET | Freq: Every day | ORAL | 0 refills | Status: DC

## 2019-12-08 NOTE — Telephone Encounter (Signed)
Pt needs to reschedlue appt in 2 months  [place]

## 2019-12-09 ENCOUNTER — Other Ambulatory Visit: Payer: Self-pay | Admitting: Podiatry

## 2019-12-09 ENCOUNTER — Other Ambulatory Visit: Payer: Self-pay

## 2019-12-09 ENCOUNTER — Encounter: Payer: Self-pay | Admitting: Podiatry

## 2019-12-12 ENCOUNTER — Other Ambulatory Visit: Payer: Self-pay | Admitting: Psychiatry

## 2019-12-12 DIAGNOSIS — F411 Generalized anxiety disorder: Secondary | ICD-10-CM

## 2019-12-12 DIAGNOSIS — G4701 Insomnia due to medical condition: Secondary | ICD-10-CM

## 2019-12-15 ENCOUNTER — Other Ambulatory Visit
Admission: RE | Admit: 2019-12-15 | Discharge: 2019-12-15 | Disposition: A | Discharge status: Home / Self Care | Payer: 59 | Source: Ambulatory Visit | Attending: Podiatry | Admitting: Podiatry

## 2019-12-15 ENCOUNTER — Other Ambulatory Visit: Payer: Self-pay

## 2019-12-15 DIAGNOSIS — Z20822 Contact with and (suspected) exposure to covid-19: Secondary | ICD-10-CM | POA: Insufficient documentation

## 2019-12-15 DIAGNOSIS — Z01812 Encounter for preprocedural laboratory examination: Secondary | ICD-10-CM | POA: Insufficient documentation

## 2019-12-15 LAB — SARS CORONAVIRUS 2 (TAT 6-24 HRS): SARS Coronavirus 2: NEGATIVE

## 2019-12-17 ENCOUNTER — Ambulatory Visit: Payer: 59 | Admitting: Anesthesiology

## 2019-12-17 ENCOUNTER — Encounter
Admission: RE | Disposition: A | Discharge status: Home / Self Care | Payer: Self-pay | Source: Home / Self Care | Attending: Podiatry

## 2019-12-17 ENCOUNTER — Ambulatory Visit
Admission: RE | Admit: 2019-12-17 | Discharge: 2019-12-17 | Disposition: A | Discharge status: Home / Self Care | Payer: 59 | Attending: Podiatry | Admitting: Podiatry

## 2019-12-17 ENCOUNTER — Encounter: Payer: Self-pay | Admitting: Podiatry

## 2019-12-17 ENCOUNTER — Other Ambulatory Visit: Payer: Self-pay

## 2019-12-17 DIAGNOSIS — Z7951 Long term (current) use of inhaled steroids: Secondary | ICD-10-CM | POA: Diagnosis not present

## 2019-12-17 DIAGNOSIS — D3613 Benign neoplasm of peripheral nerves and autonomic nervous system of lower limb, including hip: Secondary | ICD-10-CM | POA: Diagnosis present

## 2019-12-17 DIAGNOSIS — Z881 Allergy status to other antibiotic agents status: Secondary | ICD-10-CM | POA: Diagnosis not present

## 2019-12-17 DIAGNOSIS — Z79890 Hormone replacement therapy (postmenopausal): Secondary | ICD-10-CM | POA: Diagnosis not present

## 2019-12-17 DIAGNOSIS — M899 Disorder of bone, unspecified: Secondary | ICD-10-CM | POA: Diagnosis not present

## 2019-12-17 DIAGNOSIS — Z86718 Personal history of other venous thrombosis and embolism: Secondary | ICD-10-CM | POA: Diagnosis not present

## 2019-12-17 DIAGNOSIS — G894 Chronic pain syndrome: Secondary | ICD-10-CM | POA: Diagnosis not present

## 2019-12-17 DIAGNOSIS — I739 Peripheral vascular disease, unspecified: Secondary | ICD-10-CM | POA: Diagnosis not present

## 2019-12-17 DIAGNOSIS — Z880 Allergy status to penicillin: Secondary | ICD-10-CM | POA: Insufficient documentation

## 2019-12-17 DIAGNOSIS — Z888 Allergy status to other drugs, medicaments and biological substances status: Secondary | ICD-10-CM | POA: Insufficient documentation

## 2019-12-17 DIAGNOSIS — G629 Polyneuropathy, unspecified: Secondary | ICD-10-CM | POA: Diagnosis not present

## 2019-12-17 DIAGNOSIS — E785 Hyperlipidemia, unspecified: Secondary | ICD-10-CM | POA: Diagnosis not present

## 2019-12-17 DIAGNOSIS — J449 Chronic obstructive pulmonary disease, unspecified: Secondary | ICD-10-CM | POA: Diagnosis not present

## 2019-12-17 DIAGNOSIS — F329 Major depressive disorder, single episode, unspecified: Secondary | ICD-10-CM | POA: Diagnosis not present

## 2019-12-17 DIAGNOSIS — M1990 Unspecified osteoarthritis, unspecified site: Secondary | ICD-10-CM | POA: Insufficient documentation

## 2019-12-17 DIAGNOSIS — K219 Gastro-esophageal reflux disease without esophagitis: Secondary | ICD-10-CM | POA: Diagnosis not present

## 2019-12-17 DIAGNOSIS — Z882 Allergy status to sulfonamides status: Secondary | ICD-10-CM | POA: Insufficient documentation

## 2019-12-17 HISTORY — DX: Chronic pain syndrome: G89.4

## 2019-12-17 HISTORY — DX: Spondylolisthesis, site unspecified: M43.10

## 2019-12-17 HISTORY — DX: Polyneuropathy, unspecified: G62.9

## 2019-12-17 HISTORY — DX: Depression, unspecified: F32.A

## 2019-12-17 HISTORY — DX: Hyperlipidemia, unspecified: E78.5

## 2019-12-17 HISTORY — PX: MASS EXCISION: SHX2000

## 2019-12-17 HISTORY — DX: Peripheral vascular disease, unspecified: I73.9

## 2019-12-17 SURGERY — EXCISION MASS
Anesthesia: General | Site: Foot | Laterality: Left

## 2019-12-17 MED ORDER — FENTANYL CITRATE (PF) 100 MCG/2ML IJ SOLN: 25.0000 ug | INTRAMUSCULAR | Status: DC | PRN

## 2019-12-17 MED ORDER — DEXMEDETOMIDINE HCL IN NACL 200 MCG/50ML IV SOLN
INTRAVENOUS | Status: DC | PRN
  Administered 2019-12-17: 10 ug via INTRAVENOUS

## 2019-12-17 MED ORDER — OXYCODONE HCL 5 MG PO TABS
5.0000 mg | ORAL_TABLET | Freq: Once | ORAL | Status: AC | PRN
  Administered 2019-12-17: 5 mg via ORAL

## 2019-12-17 MED ORDER — POVIDONE-IODINE 7.5 % EX SOLN: Freq: Once | CUTANEOUS | Status: AC

## 2019-12-17 MED ORDER — HYDROCODONE-ACETAMINOPHEN 5-325 MG PO TABS: 1.0000 | ORAL_TABLET | Freq: Four times a day (QID) | ORAL | 0 refills | Status: DC | PRN

## 2019-12-17 MED ORDER — CLINDAMYCIN PHOSPHATE 900 MG/50ML IV SOLN
900.0000 mg | INTRAVENOUS | Status: AC
  Administered 2019-12-17: 900 mg via INTRAVENOUS

## 2019-12-17 MED ORDER — BUPIVACAINE LIPOSOME 1.3 % IJ SUSP
INTRAMUSCULAR | Status: DC | PRN
  Administered 2019-12-17: 4.5 mL
  Administered 2019-12-17: 10 mL

## 2019-12-17 MED ORDER — ONDANSETRON HCL 4 MG/2ML IJ SOLN
INTRAMUSCULAR | Status: DC | PRN
  Administered 2019-12-17: 4 mg via INTRAVENOUS

## 2019-12-17 MED ORDER — OXYCODONE HCL 5 MG/5ML PO SOLN: 5.0000 mg | Freq: Once | ORAL | Status: AC | PRN

## 2019-12-17 MED ORDER — MIDAZOLAM HCL 5 MG/5ML IJ SOLN
INTRAMUSCULAR | Status: DC | PRN
  Administered 2019-12-17: 2 mg via INTRAVENOUS

## 2019-12-17 MED ORDER — DEXAMETHASONE SODIUM PHOSPHATE 4 MG/ML IJ SOLN
INTRAMUSCULAR | Status: DC | PRN
  Administered 2019-12-17: 4 mg via INTRAVENOUS

## 2019-12-17 MED ORDER — PROPOFOL 10 MG/ML IV BOLUS
INTRAVENOUS | Status: DC | PRN
  Administered 2019-12-17: 150 mg via INTRAVENOUS
  Administered 2019-12-17: 50 mg via INTRAVENOUS

## 2019-12-17 MED ORDER — FENTANYL CITRATE (PF) 100 MCG/2ML IJ SOLN
INTRAMUSCULAR | Status: DC | PRN
  Administered 2019-12-17: 100 ug via INTRAVENOUS
  Administered 2019-12-17: 50 ug via INTRAVENOUS
  Administered 2019-12-17 (×2): 25 ug via INTRAVENOUS

## 2019-12-17 MED ORDER — BUPIVACAINE HCL (PF) 0.25 % IJ SOLN
INTRAMUSCULAR | Status: DC | PRN
  Administered 2019-12-17: 4.5 mL

## 2019-12-17 MED ORDER — LIDOCAINE HCL (CARDIAC) PF 100 MG/5ML IV SOSY
PREFILLED_SYRINGE | INTRAVENOUS | Status: DC | PRN
  Administered 2019-12-17: 40 mg via INTRATRACHEAL

## 2019-12-17 MED ORDER — LACTATED RINGERS IV SOLN: INTRAVENOUS | Status: DC

## 2019-12-17 SURGICAL SUPPLY — 30 items
BLADE MED AGGRESSIVE (BLADE) ×3 IMPLANT
BLADE SURG MINI STRL (BLADE) IMPLANT
BNDG ESMARK 4X12 TAN STRL LF (GAUZE/BANDAGES/DRESSINGS) ×3 IMPLANT
BNDG GAUZE 4.5X4.1 6PLY STRL (MISCELLANEOUS) ×3 IMPLANT
BNDG STRETCH 4X75 STRL LF (GAUZE/BANDAGES/DRESSINGS) ×3 IMPLANT
CLOSURE WOUND 1/4X4 (GAUZE/BANDAGES/DRESSINGS) ×1
COVER LIGHT HANDLE FLEXIBLE (MISCELLANEOUS) ×6 IMPLANT
CUFF TOURN SGL QUICK 18X4 (TOURNIQUET CUFF) ×3 IMPLANT
DURAPREP 26ML APPLICATOR (WOUND CARE) ×3 IMPLANT
ELECT REM PT RETURN 9FT ADLT (ELECTROSURGICAL) ×3
ELECTRODE REM PT RTRN 9FT ADLT (ELECTROSURGICAL) ×1 IMPLANT
GAUZE SPONGE 4X4 12PLY STRL (GAUZE/BANDAGES/DRESSINGS) ×3 IMPLANT
GAUZE XEROFORM 1X8 LF (GAUZE/BANDAGES/DRESSINGS) ×3 IMPLANT
GLOVE BIO SURGEON STRL SZ8 (GLOVE) ×6 IMPLANT
GLOVE INDICATOR 7.5 STRL GRN (GLOVE) ×3 IMPLANT
GOWN STRL REUS W/ TWL LRG LVL3 (GOWN DISPOSABLE) ×1 IMPLANT
GOWN STRL REUS W/ TWL XL LVL3 (GOWN DISPOSABLE) ×1 IMPLANT
GOWN STRL REUS W/TWL LRG LVL3 (GOWN DISPOSABLE) ×2
GOWN STRL REUS W/TWL XL LVL3 (GOWN DISPOSABLE) ×2
KIT TURNOVER KIT A (KITS) ×3 IMPLANT
NEEDLE HYPO 25GX1X1/2 BEV (NEEDLE) ×3 IMPLANT
NS IRRIG 500ML POUR BTL (IV SOLUTION) ×3 IMPLANT
PACK EXTREMITY ARMC (MISCELLANEOUS) ×3 IMPLANT
PENCIL SMOKE EVACUATOR (MISCELLANEOUS) ×3 IMPLANT
RASP SM TEAR CROSS CUT (RASP) ×3 IMPLANT
STOCKINETTE STRL 6IN 960660 (GAUZE/BANDAGES/DRESSINGS) ×3 IMPLANT
STRIP CLOSURE SKIN 1/4X4 (GAUZE/BANDAGES/DRESSINGS) ×2 IMPLANT
SUT ETHILON 5-0 FS-2 18 BLK (SUTURE) IMPLANT
SUT VIC AB 4-0 FS2 27 (SUTURE) ×3 IMPLANT
SYR 10ML LL (SYRINGE) ×3 IMPLANT

## 2019-12-17 NOTE — Anesthesia Postprocedure Evaluation (Signed)
Anesthesia Post Note  Patient: Maureen Randall  Procedure(s) Performed: EXCISION TUMOR FOOT DEEP LEFT (Left Foot)     Patient location during evaluation: PACU Anesthesia Type: General Level of consciousness: awake and alert Pain management: pain level controlled Vital Signs Assessment: post-procedure vital signs reviewed and stable Respiratory status: spontaneous breathing Cardiovascular status: stable Anesthetic complications: no   No complications documented.  Gillian Scarce

## 2019-12-17 NOTE — Anesthesia Preprocedure Evaluation (Signed)
Anesthesia Evaluation  Patient identified by MRN, date of birth, ID band Patient awake    Reviewed: Allergy & Precautions, H&P , NPO status , Patient's Chart, lab work & pertinent test results  Airway Mallampati: I  TM Distance: >3 FB Neck ROM: full    Dental no notable dental hx.    Pulmonary asthma ,    Pulmonary exam normal        Cardiovascular negative cardio ROS Normal cardiovascular exam Rhythm:regular Rate:Normal     Neuro/Psych Anxiety    GI/Hepatic Neg liver ROS, Medicated,  Endo/Other    Renal/GU negative Renal ROS  negative genitourinary   Musculoskeletal   Abdominal   Peds  Hematology negative hematology ROS (+)   Anesthesia Other Findings   Reproductive/Obstetrics                             Anesthesia Physical Anesthesia Plan  ASA: II  Anesthesia Plan: General LMA   Post-op Pain Management:    Induction:   PONV Risk Score and Plan:   Airway Management Planned:   Additional Equipment:   Intra-op Plan:   Post-operative Plan:   Informed Consent: I have reviewed the patients History and Physical, chart, labs and discussed the procedure including the risks, benefits and alternatives for the proposed anesthesia with the patient or authorized representative who has indicated his/her understanding and acceptance.       Plan Discussed with:   Anesthesia Plan Comments:         Anesthesia Quick Evaluation

## 2019-12-17 NOTE — Anesthesia Procedure Notes (Addendum)
Procedure Name: LMA Insertion Date/Time: 12/17/2019 7:41 AM Performed by: Nyoka Cowden, CRNA Pre-anesthesia Checklist: Patient identified, Emergency Drugs available, Suction available, Patient being monitored and Timeout performed Patient Re-evaluated:Patient Re-evaluated prior to induction Oxygen Delivery Method: Circle system utilized Preoxygenation: Pre-oxygenation with 100% oxygen Induction Type: IV induction Ventilation: Mask ventilation without difficulty LMA: LMA inserted LMA Size: 4.0 Number of attempts: 1 Placement Confirmation: positive ETCO2 and breath sounds checked- equal and bilateral Dental Injury: Teeth and Oropharynx as per pre-operative assessment

## 2019-12-17 NOTE — Discharge Instructions (Signed)
[place] DR. TROXLER, DR. Vickki Muff, AND DR. [place]   1. Take your medication as prescribed.  Pain medication should be taken only as needed.  2. Keep the dressing clean, dry and intact.  3. Keep your foot elevated above the heart level for the first 48 hours.  4. Walking to the bathroom and brief periods of walking are acceptable, unless we have instructed you to be non-weight bearing.  5. Always wear your post-op shoe when walking.  Always use your crutches if you are to be non-weight bearing.  6. Do not take a shower. Baths are permissible as long as the foot is kept out of the water.   7. Every hour you are awake:  - Bend your knee 15 times. - Flex foot 15 times - Massage calf 15 times  8. Call [facility] [phone]) if any of the following problems occur: - You develop a temperature or fever. - The bandage becomes saturated with blood. - Medication does not stop your pain. - Injury of the foot occurs. - Any symptoms of infection including redness, odor, or red streaks running from wound. -  General Anesthesia, Adult, Care After This sheet gives you information about how to care for yourself after your procedure. Your health care provider may also give you more specific instructions. If you have problems or questions, contact your health care provider. What can I expect after the procedure? After the procedure, the following side effects are common:  Pain or discomfort at the IV site.  Nausea.  Vomiting.  Sore throat.  Trouble concentrating.  Feeling cold or chills.  Weak or tired.  Sleepiness and fatigue.  Soreness and body aches. These side effects can affect parts of the body that were not involved in surgery. Follow these instructions at home:  For at least 24 hours after the procedure:  Have a responsible adult stay with you. It is  important to have someone help care for you until you are awake and alert.  Rest as needed.  Do not: ? Participate in activities in which you could fall or become injured. ? Drive. ? Use heavy machinery. ? Drink alcohol. ? Take sleeping pills or medicines that cause drowsiness. ? Make important decisions or sign legal documents. ? Take care of children on your own. Eating and drinking  Follow any instructions from your health care provider about eating or drinking restrictions.  When you feel hungry, start by eating small amounts of foods that are soft and easy to digest (bland), such as toast. Gradually return to your regular diet.  Drink enough fluid to keep your urine pale yellow.  If you vomit, rehydrate by drinking water, juice, or clear broth. General instructions  If you have sleep apnea, surgery and certain medicines can increase your risk for breathing problems. Follow instructions from your health care provider about wearing your sleep device: ? Anytime you are sleeping, including during daytime naps. ? While taking prescription pain medicines, sleeping medicines, or medicines that make you drowsy.  Return to your normal activities as told by your health care provider. Ask your health care provider what activities are safe for you.  Take over-the-counter and prescription medicines only as told by your health care provider.  If you smoke, do not smoke without supervision.  Keep all follow-up visits as told by your health care provider. This is important. Contact a health care provider if:  You have nausea or vomiting that does not get better with medicine.  You cannot eat or drink without vomiting.  You have pain that does not get better with medicine.  You are unable to pass urine.  You develop a skin rash.  You have a fever.  You have redness around your IV site that gets worse. Get help right away if:  You have difficulty breathing.  You have chest  pain.  You have blood in your urine or stool, or you vomit blood. Summary  After the procedure, it is common to have a sore throat or nausea. It is also common to feel tired.  Have a responsible adult stay with you for the first 24 hours after general anesthesia. It is important to have someone help care for you until you are awake and alert.  When you feel hungry, start by eating small amounts of foods that are soft and easy to digest (bland), such as toast. Gradually return to your regular diet.  Drink enough fluid to keep your urine pale yellow.  Return to your normal activities as told by your health care provider. Ask your health care provider what activities are safe for you. This information is not intended to replace advice given to you by your health care provider. Make sure you discuss any questions you have with your health care provider. Document Revised: 06/28/2017 Document Reviewed: 02/08/2017 Elsevier Patient Education  2020 [place] for Discharge Teaching: EXPAREL (bupivacaine liposome injectable suspension)   Your surgeon or anesthesiologist gave you EXPAREL(bupivacaine) to help control your pain after surgery.   EXPAREL is a local anesthetic that provides pain relief by numbing the tissue around the surgical site.  EXPAREL is designed to release pain medication over time and can control pain for up to 72 hours.  Depending on how you respond to EXPAREL, you may require less pain medication during your recovery.  Possible side effects:  Temporary loss of sensation or ability to move in the area where bupivacaine was injected.  Nausea, vomiting, constipation  Rarely, numbness and tingling in your mouth or lips, lightheadedness, or anxiety may occur.  Call your doctor right away if you think you may be experiencing any of these sensations, or if you have other questions regarding possible side effects.  Follow all other discharge instructions  given to you by your surgeon or nurse. Eat a healthy diet and drink plenty of water or other fluids.  If you return to the hospital for any reason within 96 hours following the administration of EXPAREL, it is important for health care providers to know that you have received this anesthetic. A teal colored band has been placed on your arm with the date, time and amount of EXPAREL you have received in order to alert and inform your health care providers. Please leave this armband in place for the full 96 hours following administration, and then you may remove the band.

## 2019-12-17 NOTE — Op Note (Signed)
Operative note   Surgeon: Dr. Albertine Patricia, DPM.    Assistant: None    Preop diagnosis: 1.  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  Exostosis dorsum of the left foot    Postop diagnosis: 1.  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  Excision of entrapped scarred neuroma dorsum of the left foot   2.  Excision of exostosis from the dorsum of the second metatarsocuneiform joint        EBL: 10 cc    Anesthesia:general liver by the anesthesia team I injected her preoperatively with 5 cc of 0.25% Marcaine with Exparel 5 cc postoperatively I injected her with 9 cc of Exparel around the operative site.    Hemostasis: Ankle tourniquet 2 and 25 mmHg pressure for a total of 23 minutes.  It was dropped a couple of times to check for any bleeders during the course of the procedure.    Specimen: Neuroma from the dorsum of the left foot was 3.2 cm in length and 1 cm in width    Complications: None    Operative indications: Chronic pain discomfort across the dorsum of the foot unresponsive to conservative care    Procedure:  Patient was brought into the OR and placed on the operating table in thesupine position. After anesthesia was obtained theleft lower extremity was prepped and draped in usual sterile fashion.  Operative Report: At this time attention directed to the dorsum of the left foot.  The DP artery earlier been marked.  Incision made just lateral that deepened blunt dissection.  The musculature and tendons in the area retracted medial laterally.  There is a large bundle of fibrous tissue that appeared to be involving the dorsal nerve in the region.  I utilize hemostats to carefully separate dissected tissues there is no artery involvement.  The nerve was seen to enter this region become significantly thickened and scarred in and exited distally in a more normal fashion.  At this time I  felt like the nerves was chronically damaged and she would be more comfortable in the long run with resection of this neuroma area.  This was accomplished with sharp removal.  This was sent to pathology for evaluation.  Next I noticed a prominence of bone at the dorsum of the second tarsometatarsal joint.  I used a combination of sagittal saw and a rasp to smooth this area down and reduce the prominence across the region.  This is an copiously irrigated.  This point periosteal tissue was then closed across the area consisting of 4-0 Vicryl in a continuous stitch deep superficial fascial layers were closed with 4-0 Vicryl continuous stitch.  Skin was closed with 4-0 Vicryl subcuticular fashion.  Exparel was injected into the region at this timeframe and a sterile compressive dressing was placed across the wound consisting of Steri-Strips Xeroform gauze 4 x 4's Kling and Kerlix.    Patient tolerated the procedure and anesthesia well.  Was transported from the OR to the PACU with all vital signs stable and vascular status intact. To be discharged per routine protocol.  Will follow up in approximately 1 week in the outpatient clinic.

## 2019-12-17 NOTE — Transfer of Care (Signed)
Immediate Anesthesia Transfer of Care Note  Patient: Maureen Randall  Procedure(s) Performed: EXCISION TUMOR FOOT DEEP LEFT (Left Foot)  Patient Location: PACU  Anesthesia Type: General LMA  Level of Consciousness: awake, alert  and patient cooperative  Airway and Oxygen Therapy: Patient Spontanous Breathing and Patient connected to supplemental oxygen  Post-op Assessment: Post-op Vital signs reviewed, Patient's Cardiovascular Status Stable, Respiratory Function Stable, Patent Airway and No signs of Nausea or vomiting  Post-op Vital Signs: Reviewed and stable  Complications: No complications documented.

## 2019-12-17 NOTE — H&P (Signed)
H and P has been reviewed and no changes are noted.  

## 2019-12-18 ENCOUNTER — Encounter: Payer: Self-pay | Admitting: Podiatry

## 2019-12-18 LAB — SURGICAL PATHOLOGY

## 2019-12-22 ENCOUNTER — Encounter: Payer: Self-pay | Admitting: Internal Medicine

## 2019-12-22 ENCOUNTER — Telehealth: Payer: Self-pay | Admitting: Internal Medicine

## 2019-12-22 ENCOUNTER — Ambulatory Visit (INDEPENDENT_AMBULATORY_CARE_PROVIDER_SITE_OTHER): Payer: 59 | Admitting: Internal Medicine

## 2019-12-22 ENCOUNTER — Other Ambulatory Visit: Payer: Self-pay

## 2019-12-22 VITALS — BP 124/72 | HR 78 | Temp 97.1°F | Ht 67.0 in | Wt 219.4 lb

## 2019-12-22 DIAGNOSIS — E669 Obesity, unspecified: Secondary | ICD-10-CM | POA: Diagnosis not present

## 2019-12-22 DIAGNOSIS — K219 Gastro-esophageal reflux disease without esophagitis: Secondary | ICD-10-CM

## 2019-12-22 DIAGNOSIS — G8929 Other chronic pain: Secondary | ICD-10-CM

## 2019-12-22 DIAGNOSIS — G5762 Lesion of plantar nerve, left lower limb: Secondary | ICD-10-CM

## 2019-12-22 DIAGNOSIS — M545 Low back pain, unspecified: Secondary | ICD-10-CM

## 2019-12-22 DIAGNOSIS — R140 Abdominal distension (gaseous): Secondary | ICD-10-CM

## 2019-12-22 DIAGNOSIS — R600 Localized edema: Secondary | ICD-10-CM

## 2019-12-22 DIAGNOSIS — I8393 Asymptomatic varicose veins of bilateral lower extremities: Secondary | ICD-10-CM

## 2019-12-22 DIAGNOSIS — Z23 Encounter for immunization: Secondary | ICD-10-CM

## 2019-12-22 DIAGNOSIS — R1032 Left lower quadrant pain: Secondary | ICD-10-CM

## 2019-12-22 MED ORDER — PHENTERMINE HCL 37.5 MG PO TABS: 37.5000 mg | ORAL_TABLET | Freq: Every day | ORAL | 0 refills | Status: DC

## 2019-12-22 NOTE — Telephone Encounter (Signed)
Call pharmacy did they get adipex 58.5?  Sent 12/08/19 not sure what is going on but resent pt must take 4 months on and 3 months off   [place]

## 2019-12-22 NOTE — Telephone Encounter (Signed)
Patient was seen today. She went to her pharmacy to pick up her phentermine (ADIPEX-P) 37.5 MG tablet and they do not have the prescription.[name]

## 2019-12-22 NOTE — Progress Notes (Signed)
Chief Complaint  Patient presents with  . Follow-up  . Medication Refill    Phentermine  . Results   F/u  1. Obesity wants to resume adipex 37.5 she has been unable to exercise since back surgery 09/2019 but prior to was doing water aerobics and lost weight  2. Back surgery 09/2019 and they had to enter through left groin to back but still having 6/10 low back pain but lower. They did surgery L5/S1  Chronic gabapentin will get from pain clinic in [place]/[facility] 3. C/o GERD with boating intermittently on prilosec 40 mg advised she can try probiotics and pepcid at night and given diet list  4. Right leg edema, chronic s/p sclerotherapy and ablation with vascular and after this procedure done remotely developed DVT  5. Left foot surgery with Dr. Elvina Mattes due to neuroma ganglion she is in foot boot and had foot appt this am   Review of Systems  Constitutional: Negative for weight loss.  HENT: Negative for hearing loss.   Eyes: Negative for blurred vision.  Respiratory: Negative for shortness of breath.   Gastrointestinal: Negative for abdominal pain.  Musculoskeletal: Positive for back pain and joint pain.  Skin: Negative for rash.  Neurological: Negative for headaches.  Psychiatric/Behavioral: Negative for depression.   Past Medical History:  Diagnosis Date  . Anxiety   . Arthritis    osteoarthritis-joints, feet, shoulder  . Asthma    previously diagnosed after a bout of bronchitis/ no problems since  . Chronic pain syndrome   . COPD (chronic obstructive pulmonary disease) ([place]) 2004   patient states probably bronchitis  . Depression   . DVT (deep venous thrombosis) ([place]) 2012   right leg. after surgery, while on BCP  . GERD (gastroesophageal reflux disease)   . Hyperlipidemia   . Neuropathy    post surgery  . PVD (peripheral vascular disease) ([place])   . Spondylolisthesis    Past Surgical History:  Procedure Laterality Date  . ABLATION     veins in lower legs  . BACK SURGERY   07/2014   lumbar spine/ fusion 1 level  . BREAST BIOPSY Left 2011   neg  . CARPAL TUNNEL RELEASE Right 12/2015   [facility]  . CARPAL TUNNEL RELEASE Left 12/2016  . FOOT SURGERY Right 2015   broken navicular bone  . HIP ARTHROSCOPY Right 05/2011  . HIP SURGERY     left hip surgery 05/2018  . JOINT REPLACEMENT Bilateral 12/2017 & 05/2018   hip  . MASS EXCISION Left 12/17/2019   Procedure: EXCISION TUMOR FOOT DEEP LEFT;  Surgeon: Albertine Patricia, DPM;  Location: [facility];  Service: Podiatry;  Laterality: Left;  LMA W/LOCAL  . NASAL TURBINATE REDUCTION Bilateral 06/12/2019   Procedure: TURBINATE REDUCTION/SUBMUCOSAL RESECTION;  Surgeon: [name], MD;  Location: [facility];  Service: ENT;  Laterality: Bilateral;  . 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 ROTATOR CUFF REPAIR     Dr. [name] in 04/2019   . SCLEROTHERAPY     veins in lower legs  . SEPTOPLASTY Bilateral 06/12/2019   Procedure: SEPTOPLASTY;  Surgeon: [name], MD;  Location: [facility];  Service: ENT;  Laterality: Bilateral;  . SHOULDER SURGERY Right 12/2015   [facility] torn rotator cuff  . SPINAL FUSION  2016   [facility] lumbar   Family History  Problem Relation Age of Onset  . Breast cancer Maternal Aunt   . Breast cancer  Paternal Aunt   . Dementia Paternal Aunt   . COPD Mother        quit smoking after 60 years  . Arthritis Mother   . Osteoarthritis Mother   . Dementia Father        symptoms started in his 21s  . Alzheimer's disease Father   . Rheum arthritis Father   . Rheum arthritis Other        FH  . Dementia Other        "all my dad's side of the family"  . Other Maternal Grandmother        got dementia as she got older   . Dementia Paternal Grandmother    Social History   Socioeconomic History  . Marital status: Married    Spouse name: fredrick  . Number of children: 1  . Years of education: Not on file   . Highest education level: High school graduate  Occupational History    Comment: full time  Tobacco Use  . Smoking status: Never Smoker  . Smokeless tobacco: Never Used  Vaping Use  . Vaping Use: Never used  Substance and Sexual Activity  . Alcohol use: Yes    Alcohol/week: 1.0 standard drink    Types: 1 Cans of beer per week    Comment: may have drink 1x/month  . Drug use: Never  . Sexual activity: Yes  Other Topics Concern  . 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 Difficulty of Paying Living Expenses:   Food Insecurity:   . Worried About Charity fundraiser in the Last Year:   . Arboriculturist in the Last Year:   Transportation Needs:   . Film/video editor (Medical):   [name] Lack of Transportation (Non-Medical):   Physical Activity:   . Days of Exercise per Week:   . Minutes of Exercise per Session:   Stress:   . Feeling of Stress :   Social Connections:   . Frequency of Communication with Friends and Family:   . Frequency of Social Gatherings with Friends and Family:   . Attends Religious Services:   . Active Member of Clubs or Organizations:   . Attends Archivist Meetings:   [name] Marital Status:   Intimate Partner Violence:   . Fear of Current or Ex-Partner:   . Emotionally Abused:   [name] Physically Abused:   . Sexually Abused:    Current Meds  Medication Sig  . baclofen (LIORESAL) 10 MG tablet Take 10 mg by mouth 3 (three) times daily as needed for muscle spasms.  . celecoxib (CELEBREX) 100 MG capsule Take 1 capsule (100 mg total) by mouth daily.  . diclofenac Sodium (VOLTAREN) 1 % GEL Apply 2-4 g topically 4 (four) times daily. 2 gram upper body qid prn and 4 gram lower body qid prn  . DULoxetine (CYMBALTA) 60 MG capsule TAKE 1  CAPSULE BY MOUTH EVERY DAY  . Elastic Bandages & Supports (RELIEF KNEE) MISC Apply in the morning and remove at night.  . estradiol (ESTRACE) 0.1 MG/GM vaginal cream INSERT PEA SIZED AMOUNT (0.5G) INTO VAGINA NIGHTLY EVERY OTHER NIGHT FOR 2 WEEKS, THEN TWICE WEEKLY  . fexofenadine (ALLEGRA) 180 MG  tablet Take 180 mg by mouth daily.  . fluticasone (FLONASE) 50 MCG/ACT nasal spray Place into both nostrils as needed.   . gabapentin (NEURONTIN) 300 MG capsule Take 300 mg by mouth at bedtime. One or 2 per night  . methocarbamol (ROBAXIN) 500 MG tablet Take 2 tablets (1,000 mg total) by mouth 2 (two) times daily as needed for muscle spasms. (Patient taking differently: Take 1,000 mg by mouth every 6 (six) hours as needed for muscle spasms. )  . Multiple Vitamin (MULTIVITAMIN) tablet Take 1 tablet by mouth daily.  . nitrofurantoin (MACRODANTIN) 50 MG capsule Take 1 capsule (50 mg total) by mouth as needed. UTI dose should be 100 mg bid x 5 days UTI  or 50-100 mg qhs prevention  . omeprazole (PRILOSEC) 40 MG capsule Take 1 capsule (40 mg total) by mouth daily. 30 min before food  . oxyCODONE-acetaminophen (PERCOCET) 10-325 MG tablet Take 1 tablet by mouth every 6 (six) hours as needed.  . traZODone (DESYREL) 100 MG tablet TAKE 1-1.5 TABLETS (100-150 MG TOTAL) BY MOUTH AT BEDTIME AS NEEDED FOR SLEEP. (Patient taking differently: at bedtime. )  . valACYclovir (VALTREX) 1000 MG tablet as needed. Oral blister   Allergies  Allergen Reactions  . Amoxicillin Hives  . Septra [Sulfamethoxazole-Trimethoprim] Hives       . Zanaflex [Tizanidine]     Weak muscles   . Cephalexin Rash        Recent Results (from the past 2160 hour(s))  Protime-INR     Status: None   Collection Time: 09/25/19  9:50 AM  Result Value Ref Range   INR 1.0 0.8 - 1.0 ratio   Prothrombin Time 10.8 9.6 - 13.1 sec  PTT     Status: None   Collection Time: 09/25/19  9:50 AM  Result Value Ref Range   aPTT 30.8 23.4 - 32.7 SEC   Hemoglobin A1c     Status: None   Collection Time: 09/25/19  9:50 AM  Result Value Ref Range   Hgb A1c MFr Bld 5.5 4.6 - 6.5 %    Comment: Glycemic Control Guidelines for People with Diabetes:Non Diabetic:  <6%Goal of Therapy: <7%Additional Action Suggested:  >8%   Lipid panel     Status: Abnormal   Collection Time: 09/25/19  9:50 AM  Result Value Ref Range   Cholesterol 217 (H) 0 - 200 mg/dL    Comment: ATP III Classification       Desirable:  < 200 mg/dL               Borderline High:  200 - 239 mg/dL          High:  > = 240 mg/dL   Triglycerides 81.0 0 - 149 mg/dL    Comment: Normal:  <150 mg/dLBorderline High:  150 - 199 mg/dL   HDL 69.10 >39.00 mg/dL   VLDL 16.2 0.0 - 40.0 mg/dL   LDL Cholesterol 132 (H) 0 - 99 mg/dL   Total CHOL/HDL Ratio 3     Comment:                Men          Women1/2 Average Risk     3.4          3.3Average Risk          5.0          4.42X Average Risk          9.6  7.13X Average Risk          15.0          11.0                       NonHDL 147.82     Comment: NOTE:  Non-HDL goal should be 30 mg/dL higher than patient's LDL goal (i.e. LDL goal of < 70 mg/dL, would have non-HDL goal of < 100 mg/dL)  CBC with Differential/Platelet     Status: None   Collection Time: 09/25/19  9:50 AM  Result Value Ref Range   WBC 5.9 4.0 - 10.5 K/uL   RBC 4.06 3.87 - 5.11 Mil/uL   Hemoglobin 12.9 12.0 - 15.0 g/dL   HCT 39.0 36 - 46 %   MCV 96.1 78.0 - 100.0 fl   MCHC 33.1 30.0 - 36.0 g/dL   RDW 13.3 11.5 - 15.5 %   Platelets 376.0 150 - 400 K/uL   Neutrophils Relative % 66.0 43 - 77 %   Lymphocytes Relative 24.0 12 - 46 %   Monocytes Relative 7.2 3 - 12 %   Eosinophils Relative 1.6 0 - 5 %   Basophils Relative 1.2 0 - 3 %   Neutro Abs 3.9 1.4 - 7.7 K/uL   Lymphs Abs 1.4 0.7 - 4.0 K/uL   Monocytes Absolute 0.4 0 - 1 K/uL   Eosinophils Absolute 0.1 0 - 0 K/uL   Basophils Absolute 0.1 0 - 0 K/uL  Comprehensive metabolic panel     Status: Abnormal   Collection  Time: 09/25/19  9:50 AM  Result Value Ref Range   Sodium 138 135 - 145 mEq/L   Potassium 3.7 3.5 - 5.1 mEq/L   Chloride 105 96 - 112 mEq/L   CO2 26 19 - 32 mEq/L   Glucose, Bld 105 (H) 70 - 99 mg/dL   BUN 22 6 - 23 mg/dL   Creatinine, Ser 0.68 0.40 - 1.20 mg/dL   Total Bilirubin 0.4 0.2 - 1.2 mg/dL   Alkaline Phosphatase 73 39 - 117 U/L   AST 15 0 - 37 U/L   ALT 18 0 - 35 U/L   Total Protein 6.9 6.0 - 8.3 g/dL   Albumin 4.1 3.5 - 5.2 g/dL   GFR 90.12 >60.00 mL/min   Calcium 9.2 8.4 - 10.5 mg/dL  Iron, TIBC and Ferritin Panel     Status: None   Collection Time: 09/25/19  9:50 AM  Result Value Ref Range   Iron 77 45 - 160 mcg/dL   TIBC 336 250 - 450 mcg/dL (calc)   %SAT 23 16 - 45 % (calc)   Ferritin 37 16 - 232 ng/mL  Urine Culture     Status: None   Collection Time: 09/25/19  9:50 AM   Specimen: Urine  Result Value Ref Range   MICRO NUMBER: [id number]    SPECIMEN QUALITY: Adequate    Sample Source URINE    STATUS: FINAL    Result: No Growth   Urinalysis, Routine w reflex microscopic     Status: None   Collection Time: 09/25/19  9:50 AM  Result Value Ref Range   Color, Urine YELLOW YELLOW   APPearance CLEAR CLEAR   Specific Gravity, Urine 1.019 1.001 - 1.03   pH < OR = 5.0 5.0 - 8.0   Glucose, UA NEGATIVE NEGATIVE   Bilirubin Urine NEGATIVE NEGATIVE   Ketones, ur NEGATIVE NEGATIVE   Hgb urine dipstick NEGATIVE NEGATIVE  Protein, ur NEGATIVE NEGATIVE   Nitrite NEGATIVE NEGATIVE   Leukocytes,Ua NEGATIVE NEGATIVE  SARS CORONAVIRUS 2 (TAT 6-24 HRS) Nasopharyngeal Nasopharyngeal Swab     Status: None   Collection Time: 12/15/19  9:58 AM   Specimen: Nasopharyngeal Swab  Result Value Ref Range   SARS Coronavirus 2 NEGATIVE NEGATIVE    Comment: (NOTE) SARS-CoV-2 target nucleic acids are NOT DETECTED. The SARS-CoV-2 RNA is generally detectable in upper and lower respiratory specimens during the acute phase of infection. Negative results do not preclude SARS-CoV-2  infection, do not rule out co-infections with other pathogens, and should not be used as the sole basis for treatment or other patient management decisions. Negative results must be combined with clinical observations, patient history, and epidemiological information. The expected result is Negative. Fact Sheet for Patients: SugarRoll.be Fact Sheet for Healthcare Providers: https://www.woods-mathews.com/ This test is not yet approved or cleared by the [place] FDA and  has been authorized for detection and/or diagnosis of SARS-CoV-2 by FDA under an Emergency Use Authorization (EUA). This EUA will remain  in effect (meaning this test can be used) for the duration of the COVID-19 declaration under Section 56 4(b)(1) of the Act, 21 U.S.C. section 360bbb-3(b)(1), unless the authorization is terminated or revoked sooner. Performed at [facility], [place] [address] [place], [place] [address]   Surgical pathology     Status: None   Collection Time: 12/17/19  8:24 AM  Result Value Ref Range   SURGICAL PATHOLOGY      SURGICAL PATHOLOGY CASE: [id number] PATIENT: Gean Maidens Surgical Pathology Report     Specimen Submitted: A. Neuroma dorsum, left foot  Clinical History: M67.472 ganglion cyst left foot, M89X9 exostosis, S84.90 XA neuropraxia lower extremity.      DIAGNOSIS: A.  NEUROMA, DORSUM LEFT FOOT; EXCISION: - CONSISTENT WITH GANGLION.   GROSS DESCRIPTION: A. Labeled: Neuroma dorsum of left foot Received: Formalin Tissue fragment(s): 1 Size: 2.8 x 0.8 x 0.3 cm Description: Received is an irregular fragment of tan-pink soft tissue. No abnormalities or mass lesions are grossly identified.  Trisected. Entirely submitted in cassette 1.     Final Diagnosis performed by Quay Burow, MD.   Electronically signed 12/18/2019 3:42:38PM The electronic signature indicates that the named Attending Pathologist has  evaluated the specimen Technical component performed at [facility], [address] [place], [place] [address] Lab: [phone] Dir: Rush Farmer, MD, MMM  P rofessional component performed at [facility], [facility], [place], [place], [place] [address] Lab: [phone] Dir: Dellia Nims. Rubinas, MD    Objective  Body mass index is 34.36 kg/m. Wt Readings from Last 3 Encounters:  12/22/19 219 lb 6.4 oz (99.5 kg)  12/17/19 214 lb (97.1 kg)  10/14/19 215 lb (97.5 kg)   Temp Readings from Last 3 Encounters:  12/22/19 (!) 97.1 F (36.2 C) (Temporal)  12/17/19 (!) 97.3 F (36.3 C)  09/23/19 (!) 96.9 F (36.1 C)   BP Readings from Last 3 Encounters:  12/22/19 124/72  12/17/19 118/76  09/23/19 127/84   Pulse Readings from Last 3 Encounters:  12/22/19 78  12/17/19 62  09/23/19 75    Physical Exam Vitals and nursing note reviewed.  Constitutional:      Appearance: Normal appearance. She is well-developed and well-groomed. She is obese.  HENT:     Head: Normocephalic and atraumatic.  Eyes:     Conjunctiva/sclera: Conjunctivae normal.     Pupils: Pupils are equal, round, and reactive to light.  Cardiovascular:  Rate and Rhythm: Normal rate and regular rhythm.     Heart sounds: Normal heart sounds. No murmur heard.   Pulmonary:     Effort: Pulmonary effort is normal.     Breath sounds: Normal breath sounds.  Skin:    General: Skin is warm and dry.  Neurological:     General: No focal deficit present.     Mental Status: She is alert and oriented to person, place, and time. Mental status is at baseline.     Gait: Gait normal.  Psychiatric:        Attention and Perception: Attention and perception normal.        Mood and Affect: Mood and affect normal.        Speech: Speech normal.        Behavior: Behavior normal. Behavior is cooperative.        Thought Content: Thought content normal.        Cognition and Memory: Cognition and memory normal.         Judgment: Judgment normal.     Assessment  Plan  Chronic bilateral low back pain, unspecified whether sciatica present meds via pain clinic and ortho spine  Ortho spine appt upcoming Dr. 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 Elvina Mattes seen today 12/23/19 has boot on foot  HM Declines flu shot  Tdap today Disc shingrixprev.and today will call back and reschedule when ready for this Consider hep B vaccine in future NR 08/31/15  -disc new Hep B vaccine in the future   HCV neg 08/31/15  covid 19 2/2   Pap neg 09/11/16 neg neg HPV Will f/u Dr. Leafy Ro has IUD upcoming appt10/2020  Mammogram 08/12/19 ob/gyn ordered Dr. Horace Porteous [place] and schedule givne # today  Colonoscopy never had discussed declines for now  Disc cologuard as well  Pt will let me know  Dermatology was due 09/2018 f/u [facility]u. [facility] Per pt seen in 2021 and negative no bxs or ln2   Never smoker   Other providers Former PCP [facility] Dr. Garen Grams then [place] [facility] requested all imaging Xrays, MRIS, CT Pain Dr. Alvira Monday in [place] specialist Dr. Levada Schilling spine in [place] [facility]-Dr. Tami Ribas stated pt did not have allergies of note had testing Psychiatry and therapy-following  PT  Of note meds pt gets from PCP  Valtrex  adipex  Macrobid  Robaxin  voltaren gel   04/14/19 ENT Dr. Tami Ribas sinusitis deviated nasal septum right nasal endoscopy and left nasacort 55 nasal hoarse voice small hemorrhage  right cord 2/2 coughing and inhalers f/u in 6 weeks after shoulder surgery    Provider: Dr. Olivia Mackie McLean-Scocuzza-Internal Medicine

## 2019-12-22 NOTE — Patient Instructions (Addendum)
Align pre and probiotic combo pill   pepid at night over the counter  Call back for shingrix vaccine  Let know colonoscopy of cologuard call insurance CPT code [phone]  High Cholesterol  High cholesterol is a condition in which the blood has high levels of a white, waxy, fat-like substance (cholesterol). The human body needs small amounts of cholesterol. The liver makes all the cholesterol that the body needs. Extra (excess) cholesterol comes from the food that we eat. Cholesterol is carried from the liver by the blood through the blood vessels. If you have high cholesterol, deposits (plaques) may build up on the walls of your blood vessels (arteries). Plaques make the arteries narrower and stiffer. Cholesterol plaques increase your risk for heart attack and stroke. Work with your health care provider to keep your cholesterol levels in a healthy range. What increases the risk? This condition is more likely to develop in people who:  Eat foods that are high in animal fat (saturated fat) or cholesterol.  Are overweight.  Are not getting enough exercise.  Have a family history of high cholesterol. What are the signs or symptoms? There are no symptoms of this condition. How is this diagnosed? This condition may be diagnosed from the results of a blood test.  If you are older than age 64, your health care provider may check your cholesterol every 4-6 years.  You may be checked more often if you already have high cholesterol or other risk factors for heart disease. The blood test for cholesterol measures:  "Bad" cholesterol (LDL cholesterol). This is the main type of cholesterol that causes heart disease. The desired level for LDL is less than 100.  "Good" cholesterol (HDL cholesterol). This type helps to protect against heart disease by cleaning the arteries and carrying the LDL away. The desired level for HDL is 60 or higher.  Triglycerides. These are fats that the body can store or burn  for energy. The desired number for triglycerides is lower than 150.  Total cholesterol. This is a measure of the total amount of cholesterol in your blood, including LDL cholesterol, HDL cholesterol, and triglycerides. A healthy number is less than 200. How is this treated? This condition is treated with diet changes, lifestyle changes, and medicines. Diet changes  This may include eating more whole grains, fruits, vegetables, nuts, and fish.  This may also include cutting back on red meat and foods that have a lot of added sugar. Lifestyle changes  Changes may include getting at least 40 minutes of aerobic exercise 3 times a week. Aerobic exercises include walking, biking, and swimming. Aerobic exercise along with a healthy diet can help you maintain a healthy weight.  Changes may also include quitting smoking. Medicines  Medicines are usually given if diet and lifestyle changes have failed to reduce your cholesterol to healthy levels.  Your health care provider may prescribe a statin medicine. Statin medicines have been shown to reduce cholesterol, which can reduce the risk of heart disease. Follow these instructions at home: Eating and drinking If told by your health care provider:  Eat chicken (without skin), fish, veal, shellfish, ground [place] breast, and round or loin cuts of red meat.  Do not eat fried foods or fatty meats, such as hot dogs and salami.  Eat plenty of fruits, such as apples.  Eat plenty of vegetables, such as broccoli, potatoes, and carrots.  Eat beans, peas, and lentils.  Eat grains such as barley, rice, couscous, and bulgur wheat.  Eat pasta without cream sauces.  Use skim or nonfat milk, and eat low-fat or nonfat yogurt and cheeses.  Do not eat or drink whole milk, cream, ice cream, egg yolks, or hard cheeses.  Do not eat stick margarine or tub margarines that contain trans fats (also called partially hydrogenated oils).  Do not eat saturated  tropical oils, such as coconut oil and palm oil.  Do not eat cakes, cookies, crackers, or other baked goods that contain trans fats.  General instructions  Exercise as directed by your health care provider. Increase your activity level with activities such as gardening, walking, and taking the stairs.  Take over-the-counter and prescription medicines only as told by your health care provider.  Do not use any products that contain nicotine or tobacco, such as cigarettes and e-cigarettes. If you need help quitting, ask your health care provider.  Keep all follow-up visits as told by your health care provider. This is important. Contact a health care provider if:  You are struggling to maintain a healthy diet or weight.  You need help to start on an exercise program.  You need help to stop smoking. Get help right away if:  You have chest pain.  You have trouble breathing. This information is not intended to replace advice given to you by your health care provider. Make sure you discuss any questions you have with your health care provider. Document Revised: 06/28/2017 Document Reviewed: 12/24/2015 Elsevier Patient Education  [place].  Cholesterol Content in Foods Cholesterol is a waxy, fat-like substance that helps to carry fat in the blood. The body needs cholesterol in small amounts, but too much cholesterol can cause damage to the arteries and heart. Most people should eat less than 200 milligrams (mg) of cholesterol a day. Foods with cholesterol  Cholesterol is found in animal-based foods, such as meat, seafood, and dairy. Generally, low-fat dairy and lean meats have less cholesterol than full-fat dairy and fatty meats. The milligrams of cholesterol per serving (mg per serving) of common cholesterol-containing foods are listed below. Meat and other proteins  Egg -- one large whole egg has 186 mg.  Veal shank -- 4 oz has 141 mg.  Lean ground [place] (93% lean) -- 4  oz has 118 mg.  Fat-trimmed lamb loin -- 4 oz has 106 mg.  Lean ground beef (90% lean) -- 4 oz has 100 mg.  Lobster -- 3.5 oz has 90 mg.  Pork loin chops -- 4 oz has 86 mg.  Canned salmon -- 3.5 oz has 83 mg.  Fat-trimmed beef top loin -- 4 oz has 78 mg.  Frankfurter -- 1 frank (3.5 oz) has 77 mg.  Crab -- 3.5 oz has 71 mg.  Roasted chicken without skin, white meat -- 4 oz has 66 mg.  Light bologna -- 2 oz has 45 mg.  Deli-cut [place] -- 2 oz has 31 mg.  Canned tuna -- 3.5 oz has 31 mg.  Berniece Salines -- 1 oz has 29 mg.  Oysters and mussels (raw) -- 3.5 oz has 25 mg.  Mackerel -- 1 oz has 22 mg.  Trout -- 1 oz has 20 mg.  Pork sausage -- 1 link (1 oz) has 17 mg.  Salmon -- 1 oz has 16 mg.  Tilapia -- 1 oz has 14 mg. Dairy  Soft-serve ice cream --  cup (4 oz) has 103 mg.  Whole-milk yogurt -- 1 cup (8 oz) has 29 mg.  Cheddar cheese -- 1 oz has  28 mg.  American cheese -- 1 oz has 28 mg.  Whole milk -- 1 cup (8 oz) has 23 mg.  2% milk -- 1 cup (8 oz) has 18 mg.  Cream cheese -- 1 tablespoon (Tbsp) has 15 mg.  Cottage cheese --  cup (4 oz) has 14 mg.  Low-fat (1%) milk -- 1 cup (8 oz) has 10 mg.  Sour cream -- 1 Tbsp has 8.5 mg.  Low-fat yogurt -- 1 cup (8 oz) has 8 mg.  Nonfat Greek yogurt -- 1 cup (8 oz) has 7 mg.  Half-and-half cream -- 1 Tbsp has 5 mg. Fats and oils  Cod liver oil -- 1 tablespoon (Tbsp) has 82 mg.  Butter -- 1 Tbsp has 15 mg.  Lard -- 1 Tbsp has 14 mg.  Bacon grease -- 1 Tbsp has 14 mg.  Mayonnaise -- 1 Tbsp has 5-10 mg.  Margarine -- 1 Tbsp has 3-10 mg. Exact amounts of cholesterol in these foods may vary depending on specific ingredients and brands. Foods without cholesterol Most plant-based foods do not have cholesterol unless you combine them with a food that has cholesterol. Foods without cholesterol include:  Grains and cereals.  Vegetables.  Fruits.  Vegetable oils, such as olive, canola, and sunflower  oil.  Legumes, such as peas, beans, and lentils.  Nuts and seeds.  Egg whites. Summary  The body needs cholesterol in small amounts, but too much cholesterol can cause damage to the arteries and heart.  Most people should eat less than 200 milligrams (mg) of cholesterol a day. This information is not intended to replace advice given to you by your health care provider. Make sure you discuss any questions you have with your health care provider. Document Revised: 06/07/2017 Document Reviewed: 02/19/2017 Elsevier Patient Education  2020 [place] for Gastroesophageal Reflux Disease, Adult When you have gastroesophageal reflux disease (GERD), the foods you eat and your eating habits are very important. Choosing the right foods can help ease the discomfort of GERD. Consider working with a diet and nutrition specialist (dietitian) to help you make healthy food choices. What general guidelines should I follow?  Eating plan  Choose healthy foods low in fat, such as fruits, vegetables, whole grains, low-fat dairy products, and lean meat, fish, and poultry.  Eat frequent, small meals instead of three large meals each day. Eat your meals slowly, in a relaxed setting. Avoid bending over or lying down until 2-3 hours after eating.  Limit high-fat foods such as fatty meats or fried foods.  Limit your intake of oils, butter, and shortening to less than 8 teaspoons each day.  Avoid the following: ? Foods that cause symptoms. These may be different for different people. Keep a food diary to keep track of foods that cause symptoms. ? Alcohol. ? Drinking large amounts of liquid with meals. ? Eating meals during the 2-3 hours before bed.  Cook foods using methods other than frying. This may include baking, grilling, or broiling. Lifestyle  Maintain a healthy weight. Ask your health care provider what weight is healthy for you. If you need to lose weight, work with your health  care provider to do so safely.  Exercise for at least 30 minutes on 5 or more days each week, or as told by your health care provider.  Avoid wearing clothes that fit tightly around your waist and chest.  Do not use any products that contain nicotine or tobacco, such as cigarettes and e-cigarettes.  If you need help quitting, ask your health care provider.  Sleep with the head of your bed raised. Use a wedge under the mattress or blocks under the bed frame to raise the head of the bed. What foods are not recommended? The items listed may not be a complete list. Talk with your dietitian about what dietary choices are best for you. Grains Pastries or quick breads with added fat. [place] toast. Vegetables Deep fried vegetables. [place] fries. Any vegetables prepared with added fat. Any vegetables that cause symptoms. For some people this may include tomatoes and tomato products, chili peppers, onions and garlic, and horseradish. Fruits Any fruits prepared with added fat. Any fruits that cause symptoms. For some people this may include citrus fruits, such as oranges, grapefruit, pineapple, and lemons. Meats and other protein foods High-fat meats, such as fatty beef or pork, hot dogs, ribs, ham, sausage, salami and bacon. Fried meat or protein, including fried fish and fried chicken. Nuts and nut butters. Dairy Whole milk and chocolate milk. Sour cream. Cream. Ice cream. Cream cheese. Milk shakes. Beverages Coffee and tea, with or without caffeine. Carbonated beverages. Sodas. Energy drinks. Fruit juice made with acidic fruits (such as orange or grapefruit). Tomato juice. Alcoholic drinks. Fats and oils Butter. Margarine. Shortening. Ghee. Sweets and desserts Chocolate and cocoa. Donuts. Seasoning and other foods Pepper. Peppermint and spearmint. Any condiments, herbs, or seasonings that cause symptoms. For some people, this may include curry, hot sauce, or vinegar-based salad  dressings. Summary  When you have gastroesophageal reflux disease (GERD), food and lifestyle choices are very important to help ease the discomfort of GERD.  Eat frequent, small meals instead of three large meals each day. Eat your meals slowly, in a relaxed setting. Avoid bending over or lying down until 2-3 hours after eating.  Limit high-fat foods such as fatty meat or fried foods. This information is not intended to replace advice given to you by your health care provider. Make sure you discuss any questions you have with your health care provider. Document Revised: 10/16/2018 Document Reviewed: 06/26/2016 Elsevier Patient Education  [place].   https://www.cdc.gov/vaccines/hcp/vis/vis-statements/tdap.pdf">  Tdap (Tetanus, Diphtheria, Pertussis) Vaccine: What You Need to Know 1. Why get vaccinated? Tdap vaccine can prevent tetanus, diphtheria, and pertussis. Diphtheria and pertussis spread from person to person. Tetanus enters the body through cuts or wounds.  TETANUS (T) causes painful stiffening of the muscles. Tetanus can lead to serious health problems, including being unable to open the mouth, having trouble swallowing and breathing, or death.  DIPHTHERIA (D) can lead to difficulty breathing, heart failure, paralysis, or death.  PERTUSSIS (aP), also known as "whooping cough," can cause uncontrollable, violent coughing which makes it hard to breathe, eat, or drink. Pertussis can be extremely serious in babies and young children, causing pneumonia, convulsions, brain damage, or death. In teens and adults, it can cause weight loss, loss of bladder control, passing out, and rib fractures from severe coughing. 2. Tdap vaccine Tdap is only for children 7 years and older, adolescents, and adults.  Adolescents should receive a single dose of Tdap, preferably at age 68 or 49 years. Pregnant women should get a dose of Tdap during every pregnancy, to protect the newborn from  pertussis. Infants are most at risk for severe, life-threatening complications from pertussis. Adults who have never received Tdap should get a dose of Tdap. Also, adults should receive a booster dose every 10 years, or earlier in the case of a severe and  dirty wound or burn. Booster doses can be either Tdap or Td (a different vaccine that protects against tetanus and diphtheria but not pertussis). Tdap may be given at the same time as other vaccines. 3. Talk with your health care provider Tell your vaccine provider if the person getting the vaccine:  Has had an allergic reaction after a previous dose of any vaccine that protects against tetanus, diphtheria, or pertussis, or has any severe, life-threatening allergies.  Has had a coma, decreased level of consciousness, or prolonged seizures within 7 days after a previous dose of any pertussis vaccine (DTP, DTaP, or Tdap).  Has seizures or another nervous system problem.  Has ever had Guillain-Barr Syndrome (also called GBS).  Has had severe pain or swelling after a previous dose of any vaccine that protects against tetanus or diphtheria. In some cases, your health care provider may decide to postpone Tdap vaccination to a future visit.  People with minor illnesses, such as a cold, may be vaccinated. People who are moderately or severely ill should usually wait until they recover before getting Tdap vaccine.  Your health care provider can give you more information. 4. Risks of a vaccine reaction  Pain, redness, or swelling where the shot was given, mild fever, headache, feeling tired, and nausea, vomiting, diarrhea, or stomachache sometimes happen after Tdap vaccine. People sometimes faint after medical procedures, including vaccination. Tell your provider if you feel dizzy or have vision changes or ringing in the ears.  As with any medicine, there is a very remote chance of a vaccine causing a severe allergic reaction, other serious injury, or  death. 5. What if there is a serious problem? An allergic reaction could occur after the vaccinated person leaves the clinic. If you see signs of a severe allergic reaction (hives, swelling of the face and throat, difficulty breathing, a fast heartbeat, dizziness, or weakness), call 9-1-1 and get the person to the nearest hospital. For other signs that concern you, call your health care provider.  Adverse reactions should be reported to the Vaccine Adverse Event Reporting System (VAERS). Your health care provider will usually file this report, or you can do it yourself. Visit the VAERS website at www.vaers.SamedayNews.es or call (204)402-9602. VAERS is only for reporting reactions, and VAERS staff do not give medical advice. 6. The National Vaccine Injury Compensation Program The [employer] Vaccine Injury Compensation Program (VICP) is a federal program that was created to compensate people who may have been injured by certain vaccines. Visit the VICP website at GoldCloset.com.ee or call (314)103-9349 to learn about the program and about filing a claim. There is a time limit to file a claim for compensation. 7. How can I learn more?  Ask your health care provider.  Call your local or state health department.  Contact the Centers for Disease Control and Prevention (CDC): ? Call (587) 807-2743 (1-800-CDC-INFO) or ? Visit CDC's website at http://hunter.com/ Vaccine Information Statement Tdap (Tetanus, Diphtheria, Pertussis) Vaccine (10/08/2018) This information is not intended to replace advice given to you by your health care provider. Make sure you discuss any questions you have with your health care provider. Document Revised: 10/17/2018 Document Reviewed: 10/20/2018 Elsevier Patient Education  [place].

## 2019-12-23 ENCOUNTER — Encounter: Payer: Self-pay | Admitting: Internal Medicine

## 2019-12-23 DIAGNOSIS — I8393 Asymptomatic varicose veins of bilateral lower extremities: Secondary | ICD-10-CM | POA: Insufficient documentation

## 2019-12-23 DIAGNOSIS — M545 Low back pain, unspecified: Secondary | ICD-10-CM | POA: Insufficient documentation

## 2019-12-23 NOTE — Telephone Encounter (Signed)
Called pharmacy and they state the patient received that medication today.

## 2019-12-24 ENCOUNTER — Ambulatory Visit (INDEPENDENT_AMBULATORY_CARE_PROVIDER_SITE_OTHER): Payer: 59 | Admitting: Licensed Clinical Social Worker

## 2019-12-24 ENCOUNTER — Other Ambulatory Visit: Payer: Self-pay

## 2019-12-24 DIAGNOSIS — F411 Generalized anxiety disorder: Secondary | ICD-10-CM | POA: Diagnosis not present

## 2019-12-24 NOTE — Progress Notes (Signed)
Virtual Visit via Video Note  I connected with [name] on 12/24/19 at 10:00 AM EDT by a video enabled telemedicine application and verified that I am speaking with the correct person using two identifiers.  Location: Patient: home Provider: [facility]   I discussed the limitations of evaluation and management by telemedicine and the availability of in person appointments. The patient expressed understanding and agreed to proceed.  I discussed the assessment and treatment plan with the patient. The patient was provided an opportunity to ask questions and all were answered. The patient agreed with the plan and demonstrated an understanding of the instructions.   The patient was advised to call back or seek an in-person evaluation if the symptoms worsen or if the condition fails to improve as anticipated.  I provided 45 minutes of non-face-to-face time during this encounter.   Shahidah Nesbitt R Shavana Calder, LCSW    THERAPIST PROGRESS NOTE  Session Time: 45 min  Participation Level: Active  Behavioral Response: NeatAlertAnxious  Type of Therapy: Individual Therapy  Treatment Goals addressed: Anxiety  Interventions: Supportive  Summary: TOMIKA ECKLES is a 54 y.o. female who presents with stress associated with chronic pain, health-related concerns, and guilt about physical limitations.  Allowed pt to explore and express thoughts and feelings about health concerns, psychological impact of chronic pain, and relationships with daughter, husband, and grandchildren. Reviewed treatment plan and revised. Acknowledged pts feelings of guilt and how she compares self to earlier version of self (prior to physical limitations). Pt currently on long-term disability at work and worried about termination. Encouraged pt to continue focus on self-care   Suicidal/Homicidal: No  Therapist Response: Dynastee is working hard to manage stress and anxiety. Pt reports that she still feels anxious throughout the day.  Treatment will focus on coping/managing chronic pain, stress management, and anxiety management.  Plan: Return again in 4 weeks.  Diagnosis: Axis I: Generalized Anxiety Disorder    Axis II: No diagnosis    Rachel Bo Claudetta Sallie, LCSW 12/24/2019

## 2019-12-24 NOTE — Patient Instructions (Signed)
Mindfulness-Based Stress Reduction Mindfulness-based stress reduction (MBSR) is a program that helps people learn to practice mindfulness. Mindfulness is the practice of intentionally paying attention to the present moment. It can be learned and practiced through techniques such as education, breathing exercises, meditation, and yoga. MBSR includes several mindfulness techniques in one program. MBSR works best when you understand the treatment, are willing to try new things, and can commit to spending time practicing what you learn. MBSR training may include learning about:  How your emotions, thoughts, and reactions affect your body.  New ways to respond to things that cause negative thoughts to start (triggers).  How to notice your thoughts and let go of them.  Practicing awareness of everyday things that you normally do without thinking.  The techniques and goals of different types of meditation. What are the benefits of MBSR? MBSR can have many benefits, which include helping you to:  Develop self-awareness. This refers to knowing and understanding yourself.  Learn skills and attitudes that help you to participate in your own health care.  Learn new ways to care for yourself.  Be more accepting about how things are, and let things go.  Be less judgmental and approach things with an open mind.  Be patient with yourself and trust yourself more. MBSR has also been shown to:  Reduce negative emotions, such as depression and anxiety.  Improve memory and focus.  Change how you sense and approach pain.  Boost your body's ability to fight infections.  Help you connect better with other people.  Improve your sense of well-being. Follow these instructions at home:   Find a local in-person or online MBSR program.  Set aside some time regularly for mindfulness practice.  Find a mindfulness practice that works best for you. This may include one or more of the  following: ? Meditation. Meditation involves focusing your mind on a certain thought or activity. ? Breathing awareness exercises. These help you to stay present by focusing on your breath. ? Body scan. For this practice, you lie down and pay attention to each part of your body from head to toe. You can identify tension and soreness and intentionally relax parts of your body. ? Yoga. Yoga involves stretching and breathing, and it can improve your ability to move and be flexible. It can also provide an experience of testing your body's limits, which can help you release stress. ? Mindful eating. This way of eating involves focusing on the taste, texture, color, and smell of each bite of food. Because this slows down eating and helps you feel full sooner, it can be an important part of a weight-loss plan.  Find a podcast or recording that provides guidance for breathing awareness, body scan, or meditation exercises. You can listen to these any time when you have a free moment to rest without distractions.  Follow your treatment plan as told by your health care provider. This may include taking regular medicines and making changes to your diet or lifestyle as recommended. How to practice mindfulness To do a basic awareness exercise:  Find a comfortable place to sit.  Pay attention to the present moment. Observe your thoughts, feelings, and surroundings just as they are.  Avoid placing judgment on yourself, your feelings, or your surroundings. Make note of any judgment that comes up, and let it go.  Your mind may wander, and that is okay. Make note of when your thoughts drift, and return your attention to the present moment. To do   basic mindfulness meditation:  Find a comfortable place to sit. This may include a stable chair or a firm floor cushion. ? Sit upright with your back straight. Let your arms fall next to your side with your hands resting on your legs. ? If sitting in a chair, rest your  feet flat on the floor. ? If sitting on a cushion, cross your legs in front of you.  Keep your head in a neutral position with your chin dropped slightly. Relax your jaw and rest the tip of your tongue on the roof of your mouth. Drop your gaze to the floor. You can close your eyes if you like.  Breathe normally and pay attention to your breath. Feel the air moving in and out of your nose. Feel your belly expanding and relaxing with each breath.  Your mind may wander, and that is okay. Make note of when your thoughts drift, and return your attention to your breath.  Avoid placing judgment on yourself, your feelings, or your surroundings. Make note of any judgment or feelings that come up, let them go, and bring your attention back to your breath.  When you are ready, lift your gaze or open your eyes. Pay attention to how your body feels after the meditation. Where to find more information You can find more information about MBSR from:  Your health care provider.  Community-based meditation centers or programs.  Programs offered near you. Summary  Mindfulness-based stress reduction (MBSR) is a program that teaches you how to intentionally pay attention to the present moment. It is used with other treatments to help you cope better with daily stress, emotions, and pain.  MBSR focuses on developing self-awareness, which allows you to respond to life stress without judgment or negative emotions.  MBSR programs may involve learning different mindfulness practices, such as breathing exercises, meditation, yoga, body scan, or mindful eating. Find a mindfulness practice that works best for you, and set aside time for it on a regular basis. This information is not intended to replace advice given to you by your health care provider. Make sure you discuss any questions you have with your health care provider. Document Revised: 06/07/2017 Document Reviewed: 11/01/2016 Elsevier Patient Education   2020 Elsevier Inc.  

## 2019-12-24 NOTE — Telephone Encounter (Signed)
Patient is agreeable to Gi referral.  She is wanting to know if it is okay to take both Omeprazole and Pepcid.   Patient wondering if she can get in to have any needed testing before 01/07/20 as she is at her out of pocket max with insurance through the end of this month. Please advise

## 2019-12-28 NOTE — Addendum Note (Signed)
Addended by: Orland Mustard on: 12/28/2019 08:04 AM   Modules accepted: Orders

## 2020-01-01 ENCOUNTER — Telehealth: Payer: Self-pay

## 2020-01-01 NOTE — Telephone Encounter (Signed)
Received forms from Disability determination services.  Forms have been forwarded to [employer].

## 2020-01-04 ENCOUNTER — Ambulatory Visit
Admission: RE | Admit: 2020-01-04 | Discharge: 2020-01-04 | Disposition: A | Discharge status: Home / Self Care | Payer: 59 | Source: Ambulatory Visit | Attending: Internal Medicine | Admitting: Internal Medicine

## 2020-01-04 ENCOUNTER — Other Ambulatory Visit: Payer: Self-pay

## 2020-01-04 ENCOUNTER — Encounter: Payer: Self-pay | Admitting: Psychiatry

## 2020-01-04 ENCOUNTER — Telehealth (INDEPENDENT_AMBULATORY_CARE_PROVIDER_SITE_OTHER): Payer: 59 | Admitting: Psychiatry

## 2020-01-04 DIAGNOSIS — F411 Generalized anxiety disorder: Secondary | ICD-10-CM | POA: Diagnosis not present

## 2020-01-04 DIAGNOSIS — G4701 Insomnia due to medical condition: Secondary | ICD-10-CM

## 2020-01-04 DIAGNOSIS — E2839 Other primary ovarian failure: Secondary | ICD-10-CM | POA: Insufficient documentation

## 2020-01-04 MED ORDER — TRAZODONE HCL 100 MG PO TABS: 200.0000 mg | ORAL_TABLET | Freq: Every evening | ORAL | 1 refills | Status: DC | PRN

## 2020-01-04 NOTE — Progress Notes (Signed)
Provider Location : [facility] Patient Location : Home  Virtual Visit via Video Note  I connected with Maureen Randall on 01/04/20 at 11:40 AM EDT by a video enabled telemedicine application and verified that I am speaking with the correct person using two identifiers.   I discussed the limitations of evaluation and management by telemedicine and the availability of in person appointments. The patient expressed understanding and agreed to proceed.    I discussed the assessment and treatment plan with the patient. The patient was provided an opportunity to ask questions and all were answered. The patient agreed with the plan and demonstrated an understanding of the instructions.   The patient was advised to call back or seek an in-person evaluation if the symptoms worsen or if the condition fails to improve as anticipated.  [address] MD OP Progress Note  01/04/2020 12:14 PM Maureen Randall  MRN:  [id number]  Chief Complaint:  Chief Complaint    Follow-up     HPI: Maureen Randall is a 54 year old Caucasian female who has a history of GAD, insomnia, chronic pain, primary osteoarthritis, lumbar radiculopathy, degenerative disc disease, spondylolisthesis, bilateral carpal tunnel syndrome, recent surgery of her back was evaluated by telemedicine today.  Patient today reports she is currently recovering from foot surgery.  She reports she had a ganglion cyst surgery of her left foot 2 weeks ago.  She currently wears a boot.  She however is making progress gradually.  She continues to struggle with back pain and reports she has no relief post surgery.  She reports she also struggles with gas and bloating which is related to intake of any food.  She reports she was referred to gastroenterologist by her primary care provider.  She reports mood symptoms are stable.  She however reports she continues to struggle with sleep.  The trazodone helps to some extent.  She denies side effects.  Patient denies any  suicidality, homicidality or perceptual disturbances.  Patient denies any other concerns today.  Visit Diagnosis:    ICD-10-CM   1. Generalized anxiety disorder  F41.1   2. Insomnia due to medical condition  G47.01 traZODone (DESYREL) 100 MG tablet    Past Psychiatric History: I have reviewed past psychiatric history from my progress note on 04/30/2018.  Past Medical History:  Past Medical History:  Diagnosis Date  . Anxiety   . Arthritis    osteoarthritis-joints, feet, shoulder  . Asthma    previously diagnosed after a bout of bronchitis/ no problems since  . Chronic pain syndrome   . COPD (chronic obstructive pulmonary disease) ([place]) 2004   patient states probably bronchitis  . Depression   . DVT (deep venous thrombosis) ([place]) 2012   right leg. after surgery, while on BCP  . GERD (gastroesophageal reflux disease)   . Hyperlipidemia   . Neuropathy    post surgery  . PVD (peripheral vascular disease) ([place])   . Spondylolisthesis     Past Surgical History:  Procedure Laterality Date  . ABLATION     veins in lower legs  . BACK SURGERY  07/2014   lumbar spine/ fusion 1 level  . BREAST BIOPSY Left 2011   neg  . CARPAL TUNNEL RELEASE Right 12/2015   [facility]  . CARPAL TUNNEL RELEASE Left 12/2016  . FOOT SURGERY Right 2015   broken navicular bone  . HIP ARTHROSCOPY Right 05/2011  . HIP SURGERY     left hip surgery 05/2018  . JOINT REPLACEMENT Bilateral 12/2017 &  05/2018   hip  . MASS EXCISION Left 12/17/2019   Procedure: EXCISION TUMOR FOOT DEEP LEFT;  Surgeon: Albertine Patricia, DPM;  Location: [facility];  Service: Podiatry;  Laterality: Left;  LMA W/LOCAL  . NASAL TURBINATE REDUCTION Bilateral 06/12/2019   Procedure: TURBINATE REDUCTION/SUBMUCOSAL RESECTION;  Surgeon: [name], MD;  Location: [facility];  Service: ENT;  Laterality: Bilateral;  . 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 ROTATOR CUFF  REPAIR     Dr. [name] in 04/2019   . SCLEROTHERAPY     veins in lower legs  . SEPTOPLASTY Bilateral 06/12/2019   Procedure: SEPTOPLASTY;  Surgeon: [name], MD;  Location: [facility];  Service: ENT;  Laterality: Bilateral;  . SHOULDER SURGERY Right 12/2015   [facility] torn rotator cuff  . SPINAL FUSION  2016   [facility] lumbar    Family Psychiatric History: I have reviewed family psychiatric history from my progress note on 04/30/2018.  Family History:  Family History  Problem Relation Age of Onset  . Breast cancer Maternal Aunt   . Breast cancer Paternal Aunt   . Dementia Paternal Aunt   . COPD Mother        quit smoking after 60 years  . Arthritis Mother   . Osteoarthritis Mother   . Dementia Father        symptoms started in his 66s  . Alzheimer's disease Father   . Rheum arthritis Father   . Rheum arthritis Other        FH  . Dementia Other        "all my dad's side of the family"  . Other Maternal Grandmother        got dementia as she got older   . Dementia Paternal Grandmother     Social History: I have reviewed social history from my progress note on 04/30/2018. Social History   Socioeconomic History  . Marital status: Married    Spouse name: fredrick  . Number of children: 1  . Years of education: Not on file  . Highest education level: High school graduate  Occupational History    Comment: full time  Tobacco Use  . Smoking status: Never Smoker  . Smokeless tobacco: Never Used  Vaping Use  . Vaping Use: Never used  Substance and Sexual Activity  . Alcohol use: Yes    Alcohol/week: 1.0 standard drink    Types: 1 Cans of beer per week    Comment: may have drink 1x/month  . Drug use: Never  . Sexual activity: Yes  Other Topics Concern  . 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 Difficulty of Paying Living Expenses:   Food Insecurity:   . Worried About Charity fundraiser in the Last Year:   . Arboriculturist in the Last Year:   Transportation Needs:   . Film/video editor (Medical):   [name] Lack of Transportation (Non-Medical):   Physical Activity:   . Days of Exercise per Week:   . Minutes of Exercise per Session:   Stress:   . Feeling of Stress :   Social Connections:   .  Frequency of Communication with Friends and Family:   . Frequency of Social Gatherings with Friends and Family:   . Attends Religious Services:   . Active Member of Clubs or Organizations:   . Attends Archivist Meetings:   [name] Marital Status:     Allergies:  Allergies  Allergen Reactions  . Amoxicillin Hives  . Septra [Sulfamethoxazole-Trimethoprim] Hives       . Zanaflex [Tizanidine]     Weak muscles   . Cephalexin Rash         Metabolic Disorder Labs: Lab Results  Component Value Date   HGBA1C 5.5 09/25/2019   No results found for: PROLACTIN Lab Results  Component Value Date   CHOL 217 (H) 09/25/2019   TRIG 81.0 09/25/2019   HDL 69.10 09/25/2019   CHOLHDL 3 09/25/2019   VLDL 16.2 09/25/2019   LDLCALC 132 (H) 09/25/2019   LDLCALC 156 (H) 01/05/2019   Lab Results  Component Value Date   TSH 3.78 01/05/2019    Therapeutic Level Labs: No results found for: LITHIUM No results found for: VALPROATE No components found for:  CBMZ  Current Medications: Current Outpatient Medications  Medication Sig Dispense Refill  . baclofen (LIORESAL) 10 MG tablet Take 10 mg by mouth 3 (three) times daily as needed for muscle spasms.    . busPIRone (BUSPAR) 10 MG tablet Take 1 tablet (10 mg total) by mouth 2 (two) times daily. (Patient not taking: Reported on 12/22/2019) 180 tablet 0  . celecoxib (CELEBREX) 100 MG capsule Take 1 capsule (100 mg total) by mouth daily. 90 capsule 3  .  diclofenac Sodium (VOLTAREN) 1 % GEL Apply 2-4 g topically 4 (four) times daily. 2 gram upper body qid prn and 4 gram lower body qid prn 100 g 11  . DULoxetine (CYMBALTA) 60 MG capsule TAKE 1 CAPSULE BY MOUTH EVERY DAY 90 capsule 1  . Elastic Bandages & Supports (RELIEF KNEE) MISC Apply in the morning and remove at night.    . estradiol (ESTRACE) 0.1 MG/GM vaginal cream INSERT PEA SIZED AMOUNT (0.5G) INTO VAGINA NIGHTLY EVERY OTHER NIGHT FOR 2 WEEKS, THEN TWICE WEEKLY    . fexofenadine (ALLEGRA) 180 MG tablet Take 180 mg by mouth daily.    . fluticasone (FLONASE) 50 MCG/ACT nasal spray Place into both nostrils as needed.     . gabapentin (NEURONTIN) 300 MG capsule Take 300 mg by mouth at bedtime. One or 2 per night    . ibuprofen (ADVIL,MOTRIN) 800 MG tablet TK 1 T PO Q 8 H WF PRN P (Patient not taking: Reported on 12/22/2019)  2  . methocarbamol (ROBAXIN) 500 MG tablet Take 2 tablets (1,000 mg total) by mouth 2 (two) times daily as needed for muscle spasms. (Patient taking differently: Take 1,000 mg by mouth every 6 (six) hours as needed for muscle spasms. ) 60 tablet 2  . Multiple Vitamin (MULTIVITAMIN) tablet Take 1 tablet by mouth daily.    . nitrofurantoin (MACRODANTIN) 50 MG capsule Take 1 capsule (50 mg total) by mouth as needed. UTI dose should be 100 mg bid x 5 days UTI  or 50-100 mg qhs prevention 120 capsule 3  . omeprazole (PRILOSEC) 40 MG capsule Take 1 capsule (40 mg total) by mouth daily. 30 min before food 90 capsule 3  . oxyCODONE-acetaminophen (PERCOCET) 10-325 MG tablet Take 1 tablet by mouth every 6 (six) hours as needed.    . phentermine (ADIPEX-P) 37.5 MG tablet Take 1 tablet (37.5 mg total)  by mouth daily before breakfast. 60 tablet 0  . traZODone (DESYREL) 100 MG tablet Take 2 tablets (200 mg total) by mouth at bedtime as needed for sleep. 60 tablet 1  . valACYclovir (VALTREX) 1000 MG tablet as needed. Oral blister     No current facility-administered medications for this  visit.     Musculoskeletal: Strength & Muscle Tone: UTA Gait & Station: normal Patient leans: N/A  Psychiatric Specialty Exam: Review of Systems  Gastrointestinal:       Bloating  Musculoskeletal: Positive for back pain.  Psychiatric/Behavioral: Positive for sleep disturbance.  All other systems reviewed and are negative.   There were no vitals taken for this visit.There is no height or weight on file to calculate BMI.  General Appearance: Casual  Eye Contact:  Fair  Speech:  Clear and Coherent  Volume:  Normal  Mood:  Euthymic  Affect:  Congruent  Thought Process:  Goal Directed and Descriptions of Associations: Intact  Orientation:  Full (Time, Place, and Person)  Thought Content: Logical   Suicidal Thoughts:  No  Homicidal Thoughts:  No  Memory:  Immediate;   Fair Recent;   Fair Remote;   Fair  Judgement:  Fair  Insight:  Fair  Psychomotor Activity:  Normal  Concentration:  Concentration: Fair and Attention Span: Fair  Recall:  [employer] of Knowledge: Fair  Language: Fair  Akathisia:  No  Handed:  Right  AIMS (if indicated): UTA  Assets:  Communication Skills Desire for Improvement Housing Social Support  ADL's:  Intact  Cognition: WNL  Sleep:  Restless   Screenings: GAD-7     Video Visit from 10/14/2019 in [facility]  Total GAD-7 Score 1    Mini-Mental     Office Visit from 09/23/2019 in [facility]  Total Score (max 30 points ) 27    PHQ2-9     Video Visit from 10/14/2019 in [facility] Office Visit from 08/14/2019 in [facility] Office Visit from 07/07/2019 in [facility] Office Visit from 03/13/2019 in [facility] from 02/11/2018 in [facility]  PHQ-2 Total Score 0 0 0 0 0  PHQ-9 Total Score -- 0 -- 0 --       Assessment and Plan: Maureen Randall is a 54 year old Caucasian  female, married, employed, currently on long-term disability, lives in [place], has a history of GAD, insomnia, chronic pain, osteoarthritis, degenerative disc disease, lumbar radiculopathy, bilateral carpal tunnel syndrome, multiple surgeries was evaluated by telemedicine today.  Patient with psychosocial stressors of chronic pain, recent back surgery, recent surgery of left foot, was evaluated by telemedicine today.  Patient continues to struggle with sleep.  Plan as noted below.  Plan GAD-stable Cymbalta 60 mg p.o. daily BuSpar 10 mg p.o. twice daily Hydroxyzine 25 to 50 mg p.o. daily as needed for severe anxiety attacks Continue psychotherapy sessions, she has established care with therapist-Ms. Christina Hussami.  Insomnia-restless due to pain Increase trazodone to 200 mg p.o. nightly as needed Also discussed adding low dosage melatonin. Discussed sleep hygiene techniques like relaxation techniques, setting scheduled bedtime and wake up time, listening to soft music, avoiding caffeine, alcohol, setting the thermostat in her room comfortable, switching of TV laptop, prior to bedtime. If she continues to struggle with sleep, discussed stopping trazodone and possibly adding doxepin which she has not tried before.  Follow-up in clinic in 6 to 8 weeks  or sooner if needed.  I have spent atleast 20 minutes non face to face with patient today. More than 50 % of the time was spent for preparing to see the patient ( e.g., review of test, records ),  ordering medications and test ,psychoeducation and supportive psychotherapy and care coordination,as well as documenting clinical information in electronic health record. This note was generated in part or whole with voice recognition software. Voice recognition is usually quite accurate but there are transcription errors that can and very often do occur. I apologize for any typographical errors that were not detected and corrected.       Ursula Alert, MD 01/04/2020, 12:14 PM

## 2020-01-06 ENCOUNTER — Other Ambulatory Visit: Payer: Self-pay | Admitting: Psychiatry

## 2020-01-06 DIAGNOSIS — G4701 Insomnia due to medical condition: Secondary | ICD-10-CM

## 2020-01-13 ENCOUNTER — Other Ambulatory Visit: Payer: Self-pay

## 2020-01-13 ENCOUNTER — Ambulatory Visit (INDEPENDENT_AMBULATORY_CARE_PROVIDER_SITE_OTHER): Payer: 59 | Admitting: Gastroenterology

## 2020-01-13 ENCOUNTER — Encounter: Payer: Self-pay | Admitting: Gastroenterology

## 2020-01-13 VITALS — BP 116/75 | HR 67 | Ht 67.0 in | Wt 211.8 lb

## 2020-01-13 DIAGNOSIS — K219 Gastro-esophageal reflux disease without esophagitis: Secondary | ICD-10-CM | POA: Diagnosis not present

## 2020-01-13 DIAGNOSIS — R140 Abdominal distension (gaseous): Secondary | ICD-10-CM

## 2020-01-13 MED ORDER — PANTOPRAZOLE SODIUM 40 MG PO TBEC
40.0000 mg | DELAYED_RELEASE_TABLET | Freq: Every day | ORAL | 6 refills | Status: DC
Start: 2020-01-13 — End: 2020-03-29

## 2020-01-13 NOTE — Progress Notes (Signed)
Gastroenterology Consultation  Referring Provider:     McLean-Scocuzza, Olivia Mackie * Primary Care Physician:  McLean-Scocuzza, Nino Glow, MD Primary Gastroenterologist:  Dr. Allen Norris     Reason for Consultation:     Bloating and GERD        HPI:   Maureen Randall is a 54 y.o. y/o female referred for consultation & management of bloating and GERD by Dr. Terese Door, Nino Glow, MD.  This patient comes in today with a history of heartburn which she is taking 40 mg of omeprazole.  The patient was seen by her primary care physician and recommended to add a probiotic and Pepcid in the evening.  The patient was also given weight loss recommendations by her primary care provider.  The patient reports that she has intermittent abdominal tightness with bloating.  She states that she has a lumpy bumpy feeling in her abdominal wall that will resolve with time.  The patient reports that she has had multiple surgeries throughout her life and most of her symptoms started after she had a back surgery which she reports to have had a frontal approach for the surgery.  The patient denies any nausea or vomiting.  There is no report of any unexplained weight loss.  She also denies ever having a colonoscopy in the past. Despite taking the omeprazole the patient reports that she is taking Tums on a regular basis.  She has lot of burping and passing gas.  She reports that she drinks carbonated drinks but has been trying to cut down and also drinks plenty of water throughout the day and also drinks with a straw. She does have a history of constipation and states that she was taking a supplement that was helping her but believes that the supplement had a change in its ingredients thereby not working as well.  Past Medical History:  Diagnosis Date  . Anxiety   . Arthritis    osteoarthritis-joints, feet, shoulder  . Asthma    previously diagnosed after a bout of bronchitis/ no problems since  . Chronic pain syndrome   . COPD  (chronic obstructive pulmonary disease) ([place]) 2004   patient states probably bronchitis  . Depression   . DVT (deep venous thrombosis) ([place]) 2012   right leg. after surgery, while on BCP  . GERD (gastroesophageal reflux disease)   . Hyperlipidemia   . Neuropathy    post surgery  . PVD (peripheral vascular disease) ([place])   . Spondylolisthesis     Past Surgical History:  Procedure Laterality Date  . ABLATION     veins in lower legs  . BACK SURGERY  07/2014   lumbar spine/ fusion 1 level  . BREAST BIOPSY Left 2011   neg  . CARPAL TUNNEL RELEASE Right 12/2015   [facility]  . CARPAL TUNNEL RELEASE Left 12/2016  . FOOT SURGERY Right 2015   broken navicular bone  . HIP ARTHROSCOPY Right 05/2011  . HIP SURGERY     left hip surgery 05/2018  . JOINT REPLACEMENT Bilateral 12/2017 & 05/2018   hip  . MASS EXCISION Left 12/17/2019   Procedure: EXCISION TUMOR FOOT DEEP LEFT;  Surgeon: Albertine Patricia, DPM;  Location: [facility];  Service: Podiatry;  Laterality: Left;  LMA W/LOCAL  . NASAL TURBINATE REDUCTION Bilateral 06/12/2019   Procedure: TURBINATE REDUCTION/SUBMUCOSAL RESECTION;  Surgeon: [name], MD;  Location: [place] [facility];  Service: ENT;  Laterality: Bilateral;  . REPLACEMENT TOTAL HIP W/  RESURFACING IMPLANTS Right 12/2017  [facility]  . ROTATOR CUFF REPAIR     Dr. [name] in 04/2019   . SCLEROTHERAPY     veins in lower legs  . SEPTOPLASTY Bilateral 06/12/2019   Procedure: SEPTOPLASTY;  Surgeon: [name], MD;  Location: [facility];  Service: ENT;  Laterality: Bilateral;  . SHOULDER SURGERY Right 12/2015   [facility] torn rotator cuff  . SPINAL FUSION  2016   [facility] lumbar    Prior to Admission medications   Medication Sig Start Date End Date Taking? Authorizing Provider  baclofen (LIORESAL) 10 MG tablet Take 10 mg by mouth 3 (three) times daily as needed for muscle spasms.    [provider]  busPIRone  (BUSPAR) 10 MG tablet Take 1 tablet (10 mg total) by mouth 2 (two) times daily. Patient not taking: Reported on 12/22/2019 09/10/19   Ursula Alert, MD  celecoxib (CELEBREX) 100 MG capsule Take 1 capsule (100 mg total) by mouth daily. 08/14/19   McLean-Scocuzza, Nino Glow, MD  diclofenac Sodium (VOLTAREN) 1 % GEL Apply 2-4 g topically 4 (four) times daily. 2 gram upper body qid prn and 4 gram lower body qid prn 07/15/19   McLean-Scocuzza, Nino Glow, MD  DULoxetine (CYMBALTA) 60 MG capsule TAKE 1 CAPSULE BY MOUTH EVERY DAY 12/14/19   Ursula Alert, MD  Elastic Bandages & Supports (RELIEF KNEE) MISC Apply in the morning and remove at night. 07/15/13   [provider]  estradiol (ESTRACE) 0.1 MG/GM vaginal cream INSERT PEA SIZED AMOUNT (0.5G) INTO VAGINA NIGHTLY EVERY OTHER NIGHT FOR 2 WEEKS, THEN TWICE WEEKLY 02/05/17   [provider]  fexofenadine (ALLEGRA) 180 MG tablet Take 180 mg by mouth daily.    [provider]  fluticasone (FLONASE) 50 MCG/ACT nasal spray Place into both nostrils as needed.     [provider]  gabapentin (NEURONTIN) 300 MG capsule Take 300 mg by mouth at bedtime. One or 2 per night    [provider]  ibuprofen (ADVIL,MOTRIN) 800 MG tablet TK 1 T PO Q 8 H WF PRN P Patient not taking: Reported on 12/22/2019 05/06/18   [provider]  methocarbamol (ROBAXIN) 500 MG tablet Take 2 tablets (1,000 mg total) by mouth 2 (two) times daily as needed for muscle spasms. Patient taking differently: Take 1,000 mg by mouth every 6 (six) hours as needed for muscle spasms.  07/07/19   McLean-Scocuzza, Nino Glow, MD  Multiple Vitamin (MULTIVITAMIN) tablet Take 1 tablet by mouth daily.    [provider]  nitrofurantoin (MACRODANTIN) 50 MG capsule Take 1 capsule (50 mg total) by mouth as needed. UTI dose should be 100 mg bid x 5 days UTI  or 50-100 mg qhs prevention 04/01/19   McLean-Scocuzza, Nino Glow, MD  omeprazole (PRILOSEC) 40 MG capsule Take  1 capsule (40 mg total) by mouth daily. 30 min before food 10/11/19   McLean-Scocuzza, Nino Glow, MD  oxyCODONE-acetaminophen (PERCOCET) 10-325 MG tablet Take 1 tablet by mouth every 6 (six) hours as needed. 06/16/19   [provider]  phentermine (ADIPEX-P) 37.5 MG tablet Take 1 tablet (37.5 mg total) by mouth daily before breakfast. 12/22/19   McLean-Scocuzza, Nino Glow, MD  traZODone (DESYREL) 100 MG tablet TAKE 2 TABLETS (200 MG TOTAL) BY MOUTH AT BEDTIME AS NEEDED FOR SLEEP. 01/07/20   Ursula Alert, MD  valACYclovir (VALTREX) 1000 MG tablet as needed. Oral blister 07/04/18   [provider]    Family History  Problem Relation Age of  Onset  . Breast cancer Maternal Aunt   . Breast cancer Paternal Aunt   . Dementia Paternal Aunt   . COPD Mother        quit smoking after 60 years  . Arthritis Mother   . Osteoarthritis Mother   . Dementia Father        symptoms started in his 18s  . Alzheimer's disease Father   . Rheum arthritis Father   . Rheum arthritis Other        FH  . Dementia Other        "all my dad's side of the family"  . Other Maternal Grandmother        got dementia as she got older   . Dementia Paternal Grandmother      Social History   Tobacco Use  . Smoking status: Never Smoker  . Smokeless tobacco: Never Used  Vaping Use  . Vaping Use: Never used  Substance Use Topics  . Alcohol use: Yes    Alcohol/week: 1.0 standard drink    Types: 1 Cans of beer per week    Comment: may have drink 1x/month  . Drug use: Never    Allergies as of 01/13/2020 - Review Complete 01/04/2020  Allergen Reaction Noted  . Amoxicillin Hives 06/14/2018  . Septra [sulfamethoxazole-trimethoprim] Hives 01/29/2018  . Zanaflex [tizanidine]  07/09/2019  . Cephalexin Rash 01/29/2018    Review of Systems:    All systems reviewed and negative except where noted in HPI.   Physical Exam:  There were no vitals taken for this visit. No LMP recorded. Patient is  postmenopausal. General:   Alert,  Well-developed, well-nourished, pleasant and cooperative in NAD Head:  Normocephalic and atraumatic. Eyes:  Sclera clear, no icterus.   Conjunctiva pink. Ears:  Normal auditory acuity. Neck:  Supple; no masses or thyromegaly. Lungs:  Respirations even and unlabored.  Clear throughout to auscultation.   No wheezes, crackles, or rhonchi. No acute distress. Heart:  Regular rate and rhythm; no murmurs, clicks, rubs, or gallops. Abdomen:  Normal bowel sounds.  No bruits.  Soft, non-tender and non-distended without masses, hepatosplenomegaly or hernias noted.  No guarding or rebound tenderness.  Negative Carnett sign.   Rectal:  Deferred.  Pulses:  Normal pulses noted. Extremities:  No clubbing or edema.  No cyanosis. Neurologic:  Alert and oriented x3;  grossly normal neurologically. Skin:  Intact without significant lesions or rashes.  No jaundice. Lymph Nodes:  No significant cervical adenopathy. Psych:  Alert and cooperative. Normal mood and affect.  Imaging Studies: DG Bone Density  Result Date: 01/04/2020 EXAM: DUAL X-RAY ABSORPTIOMETRY (DXA) FOR BONE MINERAL DENSITY IMPRESSION: Your patient Maylea Soria completed a BMD test on 01/04/2020 using the [place] (software version: 14.10) manufactured by [employer]. The following summarizes the results of our evaluation. Technologist: [facility] PATIENT BIOGRAPHICAL: Name: Ruwayda, Curet Patient ID: [id number] Birth Date: 1966-06-13 Height: 67.0 in. Gender: Female Exam Date: 01/04/2020 Weight: 211.0 lbs. Indications: Caucasian, History of Spinal Surgery, Left hip replacement, Postmenopausal, right hip replacement Fractures: Treatments: Estradiol, Mirena IUD DENSITOMETRY RESULTS: Site         Region     Measured Date Measured Age WHO Classification Young Adult T-score BMD         %Change vs. Previous Significant Change (*) AP Spine L1-L2 1/04/[age] 54.4 Normal 0.0 1.175 g/cm2 Left Forearm Radius 33%  01/04/2020 54.4 Normal -1.0 0.786 g/cm2 ASSESSMENT: The BMD measured at Forearm Radius 33% is  0.786 g/cm2 with a T-score of -1.0. This patient is considered normal according to [facility]) criteria. The scan quality is good. L-3 & 4 was excluded due to surgical hardware. Left and Right femur was excluded due to surgical hardware. World Pharmacologist [facility]) criteria for post-menopausal, Caucasian Women: Normal:                   T-score at or above -1 SD Osteopenia/low bone mass: T-score between -1 and -2.5 SD Osteoporosis:             T-score at or below -2.5 SD RECOMMENDATIONS: 1. All patients should optimize calcium and vitamin D intake. 2. Consider FDA-approved medical therapies in postmenopausal women and men aged 16 years and older, based on the following: a. A hip or vertebral(clinical or morphometric) fracture b. T-score < -2.5 at the femoral neck or spine after appropriate evaluation to exclude secondary causes c. Low bone mass (T-score between -1.0 and -2.5 at the femoral neck or spine) and a 10-year probability of a hip fracture > 3% or a 10-year probability of a major osteoporosis-related fracture > 20% based on the [place]-adapted WHO algorithm 3. Clinician judgment and/or patient preferences may indicate treatment for people with 10-year fracture probabilities above or below these levels FOLLOW-UP: People with diagnosed cases of osteoporosis or at high risk for fracture should have regular bone mineral density tests. For patients eligible for Medicare, routine testing is allowed once every 2 years. The testing frequency can be increased to one year for patients who have rapidly progressing disease, those who are receiving or discontinuing medical therapy to restore bone mass, or have additional risk factors. I have reviewed this report, and agree with the above findings. [facility] Electronically Signed   By: Lowella Grip III M.D.   On: 01/04/2020 10:07     Assessment and Plan:   ERLINDA SOLINGER is a 54 y.o. y/o female who comes in with a history of bloating heartburn and burping with flatulence.  The patient has been told the possible causes of aerophagia including chewing gum, drinking carbonated drinks, reflux and drinking with a straw.  The patient has been told to be cognizant of these things and try to avoid swallowing air.  She has also been started on a trial of Dexilant to see if this helps her symptoms better than the omeprazole.  The patient has also been encouraged to undergo an EGD and colonoscopy for not only screening purposes for the colonoscopy but also because of her dyspepsia and GERD.  The patient states she has been through so much recently as far as surgery and medications that she would like to hold off on this until later in the year and states possibly in September.  The patient has been given samples of Linzess 72 mcg to be taken once a day half hour before she eats so that she can have more complete and better bowel movements thereby possibly decreasing her bloating and abdominal discomfort.  The patient has been told to contact me if her symptoms do not improve and to contact my office if she decides to undergo her upper endoscopy and colonoscopy.    [name], MD. Mar[facility] Regal    Note: This dictation was prepared with Dragon dictation along with smaller phrase technology. Any transcriptional errors that result from this process are unintentional.

## 2020-01-28 ENCOUNTER — Other Ambulatory Visit: Payer: Self-pay

## 2020-01-28 ENCOUNTER — Ambulatory Visit (INDEPENDENT_AMBULATORY_CARE_PROVIDER_SITE_OTHER): Payer: 59 | Admitting: Licensed Clinical Social Worker

## 2020-01-28 DIAGNOSIS — F411 Generalized anxiety disorder: Secondary | ICD-10-CM

## 2020-01-28 NOTE — Progress Notes (Signed)
Virtual Visit via Video Note  I connected with Maureen Randall on 01/28/20 at 10:00 AM EDT by a video enabled telemedicine application and verified that I am speaking with the correct person using two identifiers.  Location: Patient: home Provider: [facility]   I discussed the limitations of evaluation and management by telemedicine and the availability of in person appointments. The patient expressed understanding and agreed to proceed.   The patient was advised to call back or seek an in-person evaluation if the symptoms worsen or if the condition fails to improve as anticipated.  I provided 45 minutes of non-face-to-face time during this encounter.   Freeda Spivey R Mitzie Marlar, LCSW   THERAPIST PROGRESS NOTE  Session Time: 10:00-10:45 am  Participation Level: Active  Behavioral Response: Neat and Well GroomedAlertDepressed  Type of Therapy: Individual Therapy  Treatment Goals addressed: Coping  Interventions: CBT and Supportive  Summary: Maureen Randall is a 54 y.o. female who presents with slight increase in depression symptoms triggered by feeling "stagnant".  Discussed chronic pain and how it impairs pts ability to go out and do things that she enjoys. Discussed other ways that pt can cognitively stimulate brain.  Allowed pt to explore and express thoughts and feelings about health-related concerns.    Suicidal/Homicidal: No  Therapist Response: Pt is experiencing fluctuating/intermittent progress due to external stressors. Pt and therapist will continue developing skills related to stress management, mood management, and positive focus on overall emotional and physical well-being.  Plan: Return again in 4 weeks. PHQ next session.   Diagnosis: Axis I: Generalized Anxiety Disorder    Axis II: No diagnosis    Rachel Bo Eluterio Seymour, LCSW 01/28/2020

## 2020-02-09 ENCOUNTER — Ambulatory Visit: Payer: 59 | Admitting: Internal Medicine

## 2020-02-10 ENCOUNTER — Encounter: Payer: Self-pay | Admitting: Internal Medicine

## 2020-02-15 ENCOUNTER — Other Ambulatory Visit: Payer: Self-pay

## 2020-02-15 MED ORDER — FLUTICASONE PROPIONATE 50 MCG/ACT NA SUSP: 1.0000 | NASAL | 1 refills | Status: DC | PRN

## 2020-02-16 ENCOUNTER — Other Ambulatory Visit: Payer: Self-pay | Admitting: Internal Medicine

## 2020-02-16 DIAGNOSIS — J309 Allergic rhinitis, unspecified: Secondary | ICD-10-CM

## 2020-02-16 MED ORDER — FLUTICASONE PROPIONATE 50 MCG/ACT NA SUSP: 1.0000 | Freq: Every day | NASAL | 12 refills | Status: DC | PRN

## 2020-02-17 ENCOUNTER — Other Ambulatory Visit: Payer: Self-pay

## 2020-02-17 DIAGNOSIS — K219 Gastro-esophageal reflux disease without esophagitis: Secondary | ICD-10-CM

## 2020-02-22 ENCOUNTER — Other Ambulatory Visit: Payer: Self-pay

## 2020-02-22 ENCOUNTER — Encounter: Payer: Self-pay | Admitting: Gastroenterology

## 2020-02-22 MED ORDER — SUTAB 1479-225-188 MG PO TABS: 376.0000 mg | ORAL_TABLET | ORAL | 0 refills | Status: DC

## 2020-02-25 ENCOUNTER — Other Ambulatory Visit
Admission: RE | Admit: 2020-02-25 | Discharge: 2020-02-25 | Disposition: A | Discharge status: Home / Self Care | Payer: 59 | Source: Ambulatory Visit | Attending: Gastroenterology | Admitting: Gastroenterology

## 2020-02-25 ENCOUNTER — Other Ambulatory Visit: Payer: Self-pay

## 2020-02-25 ENCOUNTER — Other Ambulatory Visit: Payer: 59

## 2020-02-25 DIAGNOSIS — Z01812 Encounter for preprocedural laboratory examination: Secondary | ICD-10-CM | POA: Insufficient documentation

## 2020-02-25 DIAGNOSIS — Z20822 Contact with and (suspected) exposure to covid-19: Secondary | ICD-10-CM | POA: Insufficient documentation

## 2020-02-26 LAB — SARS CORONAVIRUS 2 (TAT 6-24 HRS): SARS Coronavirus 2: NEGATIVE

## 2020-02-26 NOTE — Discharge Instructions (Signed)
General Anesthesia, Adult, Care After This sheet gives you information about how to care for yourself after your procedure. Your health care provider may also give you more specific instructions. If you have problems or questions, contact your health care provider. What can I expect after the procedure? After the procedure, the following side effects are common:  Pain or discomfort at the IV site.  Nausea.  Vomiting.  Sore throat.  Trouble concentrating.  Feeling cold or chills.  Weak or tired.  Sleepiness and fatigue.  Soreness and body aches. These side effects can affect parts of the body that were not involved in surgery. Follow these instructions at home:  For at least 24 hours after the procedure:  Have a responsible adult stay with you. It is important to have someone help care for you until you are awake and alert.  Rest as needed.  Do not: ? Participate in activities in which you could fall or become injured. ? Drive. ? Use heavy machinery. ? Drink alcohol. ? Take sleeping pills or medicines that cause drowsiness. ? Make important decisions or sign legal documents. ? Take care of children on your own. Eating and drinking  Follow any instructions from your health care provider about eating or drinking restrictions.  When you feel hungry, start by eating small amounts of foods that are soft and easy to digest (bland), such as toast. Gradually return to your regular diet.  Drink enough fluid to keep your urine pale yellow.  If you vomit, rehydrate by drinking water, juice, or clear broth. General instructions  If you have sleep apnea, surgery and certain medicines can increase your risk for breathing problems. Follow instructions from your health care provider about wearing your sleep device: ? Anytime you are sleeping, including during daytime naps. ? While taking prescription pain medicines, sleeping medicines, or medicines that make you drowsy.  Return to  your normal activities as told by your health care provider. Ask your health care provider what activities are safe for you.  Take over-the-counter and prescription medicines only as told by your health care provider.  If you smoke, do not smoke without supervision.  Keep all follow-up visits as told by your health care provider. This is important. Contact a health care provider if:  You have nausea or vomiting that does not get better with medicine.  You cannot eat or drink without vomiting.  You have pain that does not get better with medicine.  You are unable to pass urine.  You develop a skin rash.  You have a fever.  You have redness around your IV site that gets worse. Get help right away if:  You have difficulty breathing.  You have chest pain.  You have blood in your urine or stool, or you vomit blood. Summary  After the procedure, it is common to have a sore throat or nausea. It is also common to feel tired.  Have a responsible adult stay with you for the first 24 hours after general anesthesia. It is important to have someone help care for you until you are awake and alert.  When you feel hungry, start by eating small amounts of foods that are soft and easy to digest (bland), such as toast. Gradually return to your regular diet.  Drink enough fluid to keep your urine pale yellow.  Return to your normal activities as told by your health care provider. Ask your health care provider what activities are safe for you. This information is not   intended to replace advice given to you by your health care provider. Make sure you discuss any questions you have with your health care provider. Document Revised: 06/28/2017 Document Reviewed: 02/08/2017 Elsevier Patient Education  2020 [employer]  

## 2020-02-29 ENCOUNTER — Encounter: Payer: Self-pay | Admitting: Gastroenterology

## 2020-02-29 ENCOUNTER — Ambulatory Visit: Payer: 59 | Admitting: Anesthesiology

## 2020-02-29 ENCOUNTER — Encounter
Admission: RE | Disposition: A | Discharge status: Home / Self Care | Payer: Self-pay | Source: Home / Self Care | Attending: Gastroenterology

## 2020-02-29 ENCOUNTER — Ambulatory Visit
Admission: RE | Admit: 2020-02-29 | Discharge: 2020-02-29 | Disposition: A | Discharge status: Home / Self Care | Payer: 59 | Attending: Gastroenterology | Admitting: Gastroenterology

## 2020-02-29 DIAGNOSIS — Z1211 Encounter for screening for malignant neoplasm of colon: Secondary | ICD-10-CM | POA: Diagnosis not present

## 2020-02-29 DIAGNOSIS — Z79899 Other long term (current) drug therapy: Secondary | ICD-10-CM | POA: Insufficient documentation

## 2020-02-29 DIAGNOSIS — F329 Major depressive disorder, single episode, unspecified: Secondary | ICD-10-CM | POA: Insufficient documentation

## 2020-02-29 DIAGNOSIS — J449 Chronic obstructive pulmonary disease, unspecified: Secondary | ICD-10-CM | POA: Insufficient documentation

## 2020-02-29 DIAGNOSIS — Z86718 Personal history of other venous thrombosis and embolism: Secondary | ICD-10-CM | POA: Diagnosis not present

## 2020-02-29 DIAGNOSIS — K449 Diaphragmatic hernia without obstruction or gangrene: Secondary | ICD-10-CM | POA: Diagnosis not present

## 2020-02-29 DIAGNOSIS — K640 First degree hemorrhoids: Secondary | ICD-10-CM | POA: Diagnosis not present

## 2020-02-29 DIAGNOSIS — G894 Chronic pain syndrome: Secondary | ICD-10-CM | POA: Diagnosis not present

## 2020-02-29 DIAGNOSIS — M8949 Other hypertrophic osteoarthropathy, multiple sites: Secondary | ICD-10-CM | POA: Diagnosis not present

## 2020-02-29 DIAGNOSIS — R12 Heartburn: Secondary | ICD-10-CM | POA: Insufficient documentation

## 2020-02-29 DIAGNOSIS — K219 Gastro-esophageal reflux disease without esophagitis: Secondary | ICD-10-CM | POA: Diagnosis not present

## 2020-02-29 DIAGNOSIS — M1990 Unspecified osteoarthritis, unspecified site: Secondary | ICD-10-CM | POA: Insufficient documentation

## 2020-02-29 DIAGNOSIS — I739 Peripheral vascular disease, unspecified: Secondary | ICD-10-CM | POA: Diagnosis not present

## 2020-02-29 HISTORY — PX: COLONOSCOPY WITH PROPOFOL: SHX5780

## 2020-02-29 HISTORY — PX: ESOPHAGOGASTRODUODENOSCOPY (EGD) WITH PROPOFOL: SHX5813

## 2020-02-29 SURGERY — COLONOSCOPY WITH PROPOFOL
Anesthesia: General | Site: Rectum

## 2020-02-29 MED ORDER — LACTATED RINGERS IV SOLN: INTRAVENOUS | Status: DC

## 2020-02-29 MED ORDER — ACETAMINOPHEN 160 MG/5ML PO SOLN: 325.0000 mg | Freq: Once | ORAL | Status: DC

## 2020-02-29 MED ORDER — SODIUM CHLORIDE 0.9 % IV SOLN: INTRAVENOUS | Status: DC

## 2020-02-29 MED ORDER — LUBIPROSTONE 24 MCG PO CAPS: 24.0000 ug | ORAL_CAPSULE | Freq: Two times a day (BID) | ORAL | 3 refills | Status: DC

## 2020-02-29 MED ORDER — PROPOFOL 10 MG/ML IV BOLUS
INTRAVENOUS | Status: DC | PRN
  Administered 2020-02-29: 80 mg via INTRAVENOUS
  Administered 2020-02-29: 20 mg via INTRAVENOUS
  Administered 2020-02-29 (×2): 40 mg via INTRAVENOUS
  Administered 2020-02-29: 20 mg via INTRAVENOUS
  Administered 2020-02-29 (×2): 40 mg via INTRAVENOUS
  Administered 2020-02-29 (×2): 20 mg via INTRAVENOUS

## 2020-02-29 MED ORDER — FENTANYL CITRATE (PF) 100 MCG/2ML IJ SOLN
50.0000 ug | Freq: Once | INTRAMUSCULAR | Status: AC
  Administered 2020-02-29: 50 ug via INTRAVENOUS

## 2020-02-29 MED ORDER — STERILE WATER FOR IRRIGATION IR SOLN: Status: DC | PRN

## 2020-02-29 MED ORDER — GLYCOPYRROLATE 0.2 MG/ML IJ SOLN
INTRAMUSCULAR | Status: DC | PRN
  Administered 2020-02-29: .1 mg via INTRAVENOUS

## 2020-02-29 MED ORDER — LIDOCAINE HCL (CARDIAC) PF 100 MG/5ML IV SOSY
PREFILLED_SYRINGE | INTRAVENOUS | Status: DC | PRN
  Administered 2020-02-29: 50 mg via INTRAVENOUS

## 2020-02-29 MED ORDER — ACETAMINOPHEN 325 MG PO TABS: 325.0000 mg | ORAL_TABLET | Freq: Once | ORAL | Status: DC

## 2020-02-29 SURGICAL SUPPLY — 6 items
BLOCK BITE 60FR ADLT L/F GRN (MISCELLANEOUS) ×4 IMPLANT
GOWN CVR UNV OPN BCK APRN NK (MISCELLANEOUS) ×4 IMPLANT
GOWN ISOL THUMB LOOP REG UNIV (MISCELLANEOUS) ×8
KIT ENDO PROCEDURE OLY (KITS) ×4 IMPLANT
MANIFOLD NEPTUNE II (INSTRUMENTS) ×4 IMPLANT
WATER STERILE IRR 250ML POUR (IV SOLUTION) ×4 IMPLANT

## 2020-02-29 NOTE — H&P (Signed)
[name], MD [address] [place], [place] [address] Phone:[phone] Fax : [phone]  Primary Care Physician:  McLean-Scocuzza, Nino Glow, MD Primary Gastroenterologist:  Dr. Allen Norris  Pre-Procedure History & Physical: HPI:  Maureen Randall is a 54 y.o. female is here for an endoscopy and colonoscopy.   Past Medical History:  Diagnosis Date  . Anxiety   . Arthritis    osteoarthritis-joints, feet, shoulder  . Asthma    previously diagnosed after a bout of bronchitis/ no problems since  . Chronic pain syndrome   . COPD (chronic obstructive pulmonary disease) ([facility]) 2004   patient states probably bronchitis  . Depression   . DVT (deep venous thrombosis) ([facility]) 2012   right leg. after surgery, while on BCP  . GERD (gastroesophageal reflux disease)   . Hyperlipidemia   . Neuropathy    post surgery  . PVD (peripheral vascular disease) ([facility])   . Spondylolisthesis     Past Surgical History:  Procedure Laterality Date  . ABLATION     veins in lower legs  . BACK SURGERY  07/2014   lumbar spine/ fusion 1 level  . BREAST BIOPSY Left 2011   neg  . CARPAL TUNNEL RELEASE Right 12/2015   [facility]  . CARPAL TUNNEL RELEASE Left 12/2016  . FOOT SURGERY Right 2015   broken navicular bone  . HIP ARTHROSCOPY Right 05/2011  . HIP SURGERY     left hip surgery 05/2018  . JOINT REPLACEMENT Bilateral 12/2017 & 05/2018   hip  . MASS EXCISION Left 12/17/2019   Procedure: EXCISION TUMOR FOOT DEEP LEFT;  Surgeon: Albertine Patricia, DPM;  Location: [facility];  Service: Podiatry;  Laterality: Left;  LMA W/LOCAL  . NASAL TURBINATE REDUCTION Bilateral 06/12/2019   Procedure: TURBINATE REDUCTION/SUBMUCOSAL RESECTION;  Surgeon: [name], MD;  Location: [facility];  Service: ENT;  Laterality: Bilateral;  . 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 ROTATOR CUFF REPAIR     Dr. [name] in 04/2019   . SCLEROTHERAPY       veins in lower legs  . SEPTOPLASTY Bilateral 06/12/2019   Procedure: SEPTOPLASTY;  Surgeon: [name], MD;  Location: [facility];  Service: ENT;  Laterality: Bilateral;  . SHOULDER SURGERY Right 12/2015   [facility] torn rotator cuff  . SPINAL FUSION  2016   [facility] lumbar    Prior to Admission medications   Medication Sig Start Date End Date Taking? Authorizing Provider  baclofen (LIORESAL) 10 MG tablet Take 10 mg by mouth 3 (three) times daily as needed for muscle spasms.   Yes [provider]  diclofenac Sodium (VOLTAREN) 1 % GEL Apply 2-4 g topically 4 (four) times daily. 2 gram upper body qid prn and 4 gram lower body qid prn 07/15/19  Yes McLean-Scocuzza, Nino Glow, MD  DULoxetine (CYMBALTA) 60 MG capsule TAKE 1 CAPSULE BY MOUTH EVERY DAY 12/14/19  Yes Eappen, Saramma, MD  estradiol (ESTRACE) 0.1 MG/GM vaginal cream INSERT PEA SIZED AMOUNT (0.5G) INTO VAGINA NIGHTLY EVERY OTHER NIGHT FOR 2 WEEKS, THEN TWICE WEEKLY 02/05/17  Yes [provider]  fluticasone (FLONASE) 50 MCG/ACT nasal spray Place 1-2 sprays into both nostrils daily as needed. Max dose 2 sprays each nostril 02/16/20  Yes McLean-Scocuzza, Nino Glow, MD  gabapentin (NEURONTIN) 300 MG capsule Take 300 mg by mouth at bedtime. One or 2 per night   Yes [provider]  ibuprofen (  ADVIL,MOTRIN) 800 MG tablet TK 1 T PO Q 8 H WF PRN P 05/06/18  Yes [provider]  Melatonin 3 MG CAPS Take by mouth.   Yes [provider]  methocarbamol (ROBAXIN) 500 MG tablet Take 2 tablets (1,000 mg total) by mouth 2 (two) times daily as needed for muscle spasms. Patient taking differently: Take 1,000 mg by mouth every 6 (six) hours as needed for muscle spasms.  07/07/19  Yes McLean-Scocuzza, Nino Glow, MD  Multiple Vitamin (MULTIVITAMIN) tablet Take 1 tablet by mouth daily.    Yes [provider]  nitrofurantoin (MACRODANTIN) 50 MG capsule Take 1 capsule (50 mg total) by mouth as  needed. UTI dose should be 100 mg bid x 5 days UTI  or 50-100 mg qhs prevention 04/01/19  Yes McLean-Scocuzza, Nino Glow, MD  oxyCODONE-acetaminophen (PERCOCET) 10-325 MG tablet Take 1 tablet by mouth every 6 (six) hours as needed. 06/16/19  Yes [provider]  pantoprazole (PROTONIX) 40 MG tablet Take 1 tablet (40 mg total) by mouth daily. 01/13/20  Yes [name], MD  phentermine (ADIPEX-P) 37.5 MG tablet Take 1 tablet (37.5 mg total) by mouth daily before breakfast. 12/22/19  Yes McLean-Scocuzza, Nino Glow, MD  traZODone (DESYREL) 100 MG tablet TAKE 2 TABLETS (200 MG TOTAL) BY MOUTH AT BEDTIME AS NEEDED FOR SLEEP. 01/07/20  Yes Eappen, Ria Clock, MD  valACYclovir (VALTREX) 1000 MG tablet as needed. Oral blister 07/04/18  Yes [provider]  celecoxib (CELEBREX) 100 MG capsule Take 1 capsule (100 mg total) by mouth daily. Patient not taking: Reported on 02/22/2020 08/14/19   McLean-Scocuzza, Nino Glow, MD  Elastic Bandages & Supports (RELIEF KNEE) MISC Apply in the morning and remove at night. 07/15/13   [provider]  fexofenadine (ALLEGRA) 180 MG tablet Take 180 mg by mouth daily. Patient not taking: Reported on 02/22/2020    [provider]  omeprazole (PRILOSEC) 40 MG capsule Take 1 capsule (40 mg total) by mouth daily. 30 min before food Patient not taking: Reported on 02/22/2020 10/11/19   McLean-Scocuzza, Nino Glow, MD  Sodium Sulfate-Mag Sulfate-KCl (SUTAB) [phone] MG TABS Take 376 mg by mouth as directed. 02/22/20   [name], MD    Allergies as of 02/17/2020 - Review Complete 01/13/2020  Allergen Reaction Noted  . Amoxicillin Hives 06/14/2018  . Septra [sulfamethoxazole-trimethoprim] Hives 01/29/2018  . Zanaflex [tizanidine]  07/09/2019  . Cephalexin Rash 01/29/2018    Family History  Problem Relation Age of Onset  . Breast cancer Maternal Aunt   . Breast cancer Paternal Aunt   . Dementia Paternal Aunt   . COPD Mother        quit smoking after 60  years  . Arthritis Mother   . Osteoarthritis Mother   . Dementia Father        symptoms started in his 75s  . Alzheimer's disease Father   . Rheum arthritis Father   . Rheum arthritis Other        FH  . Dementia Other        "all my dad's side of the family"  . Other Maternal Grandmother        got dementia as she got older   . Dementia Paternal Grandmother     Social History   Socioeconomic History  . Marital status: Married    Spouse name: fredrick  . Number of children: 1  . Years of education: Not on file  . Highest education level: High school graduate  Occupational History    Comment: full time  Tobacco Use  . Smoking status: Never Smoker  . Smokeless tobacco: Never Used  Vaping Use  . Vaping Use: Never used  Substance and Sexual Activity  . Alcohol use: Yes    Alcohol/week: 1.0 standard drink    Types: 1 Cans of beer per week    Comment: may have drink 1x/month  . Drug use: Never  . Sexual activity: Yes  Other Topics Concern  . 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 Difficulty of Paying Living Expenses: Not on file  Food Insecurity:   . Worried About Charity fundraiser in the Last Year: Not on file  . Ran Out of Food in the Last Year: Not on file  Transportation Needs:   . Lack of Transportation (Medical): Not on file  . Lack of Transportation (Non-Medical): Not on file  Physical Activity:   . Days of Exercise per Week: Not on file  . Minutes of Exercise per Session: Not on file  Stress:   . Feeling of Stress : Not on file  Social Connections:   . Frequency of Communication with Friends and Family: Not on file  . Frequency of Social Gatherings with Friends and Family: Not on file  . Attends Religious Services: Not on file  .  Active Member of Clubs or Organizations: Not on file  . Attends Archivist Meetings: Not on file  . Marital Status: Not on file  Intimate Partner Violence:   . Fear of Current or Ex-Partner: Not on file  . Emotionally Abused: Not on file  . Physically Abused: Not on file  . Sexually Abused: Not on file    Review of Systems: See HPI, otherwise negative ROS  Physical Exam: BP 131/89   Pulse 70   Temp (!) 97.5 F (36.4 C) (Temporal)   Resp 16   Ht 5\' 7"  (1.702 m)   Wt 92.9 kg   SpO2 100%   BMI 32.06 kg/m  General:   Alert,  pleasant and cooperative in NAD Head:  Normocephalic and atraumatic. Neck:  Supple; no masses or thyromegaly. Lungs:  Clear throughout to auscultation.    Heart:  Regular rate and rhythm. Abdomen:  Soft, nontender and nondistended. Normal bowel sounds, without guarding, and without rebound.   Neurologic:  Alert and  oriented x4;  grossly normal neurologically.  Impression/Plan: Maureen Randall is here for an endoscopy and colonoscopy to be performed for GERD and screening  Risks, benefits, limitations, and alternatives regarding  endoscopy and colonoscopy have been reviewed with the patient.  Questions have been answered.  All parties agreeable.   [name], MD  02/29/2020, 10:24 AM

## 2020-02-29 NOTE — Anesthesia Postprocedure Evaluation (Signed)
Anesthesia Post Note  Patient: Maureen Randall  Procedure(s) Performed: COLONOSCOPY WITH PROPOFOL (N/A Rectum) ESOPHAGOGASTRODUODENOSCOPY (EGD) WITH PROPOFOL (N/A Mouth)     Patient location during evaluation: PACU Anesthesia Type: General Level of consciousness: awake and alert and oriented Pain management: satisfactory to patient Vital Signs Assessment: post-procedure vital signs reviewed and stable Respiratory status: spontaneous breathing, nonlabored ventilation and respiratory function stable Cardiovascular status: blood pressure returned to baseline and stable Postop Assessment: Adequate PO intake and No signs of nausea or vomiting Anesthetic complications: no   No complications documented.  Raliegh Ip

## 2020-02-29 NOTE — Anesthesia Preprocedure Evaluation (Signed)
Anesthesia Evaluation  Patient identified by MRN, date of birth, ID band Patient awake    Reviewed: Allergy & Precautions, H&P , NPO status , Patient's Chart, lab work & pertinent test results  Airway Mallampati: II  TM Distance: >3 FB Neck ROM: full    Dental no notable dental hx.    Pulmonary asthma ,    Pulmonary exam normal breath sounds clear to auscultation       Cardiovascular negative cardio ROS Normal cardiovascular exam Rhythm:regular Rate:Normal     Neuro/Psych Anxiety    GI/Hepatic Neg liver ROS, GERD  ,  Endo/Other    Renal/GU negative Renal ROS  negative genitourinary   Musculoskeletal   Abdominal   Peds  Hematology negative hematology ROS (+)   Anesthesia Other Findings   Reproductive/Obstetrics                             Anesthesia Physical  Anesthesia Plan  ASA: II  Anesthesia Plan: General   Post-op Pain Management:    Induction: Intravenous  PONV Risk Score and Plan: 3 and Treatment may vary due to age or medical condition, TIVA and Propofol infusion  Airway Management Planned: Natural Airway  Additional Equipment:   Intra-op Plan:   Post-operative Plan:   Informed Consent: I have reviewed the patients History and Physical, chart, labs and discussed the procedure including the risks, benefits and alternatives for the proposed anesthesia with the patient or authorized representative who has indicated his/her understanding and acceptance.     Dental Advisory Given  Plan Discussed with: CRNA  Anesthesia Plan Comments:         Anesthesia Quick Evaluation

## 2020-02-29 NOTE — Op Note (Signed)
[facility] Patient Name: Maureen Randall Procedure Date: 02/29/2020 10:25 AM MRN: [id number] Account #: [id number] Date of Birth: [birth year] Admit Type: Outpatient Age: 54 Room: [facility] OR ROOM 01 Gender: Female Note Status: Finalized Procedure:             Colonoscopy Indications:           Screening for colorectal malignant neoplasm Providers:             [name] MD, MD Referring MD:          Nino Glow Mclean-Scocuzza MD, MD (Referring MD) Medicines:             Propofol per Anesthesia Complications:         No immediate complications. Procedure:             Pre-Anesthesia Assessment:                        - Prior to the procedure, a History and Physical was                         performed, and patient medications and allergies were                         reviewed. The patient's tolerance of previous                         anesthesia was also reviewed. The risks and benefits                         of the procedure and the sedation options and risks                         were discussed with the patient. All questions were                         answered, and informed consent was obtained. Prior                         Anticoagulants: The patient has taken no previous                         anticoagulant or antiplatelet agents. ASA Grade                         Assessment: II - A patient with mild systemic disease.                         After reviewing the risks and benefits, the patient                         was deemed in satisfactory condition to undergo the                         procedure.                        After obtaining informed consent, the colonoscope was  passed under direct vision. Throughout the procedure,                         the patient's blood pressure, pulse, and oxygen                         saturations were monitored continuously. The was                         introduced through the  anus and advanced to the the                         cecum, identified by appendiceal orifice and ileocecal                         valve. The colonoscopy was performed without                         difficulty. The patient tolerated the procedure well.                         The quality of the bowel preparation was excellent. Findings:      The perianal and digital rectal examinations were normal.      Non-bleeding internal hemorrhoids were found during retroflexion. The       hemorrhoids were Grade I (internal hemorrhoids that do not prolapse). Impression:            - Non-bleeding internal hemorrhoids.                        - No specimens collected. Recommendation:        - Discharge patient to home.                        - Resume previous diet.                        - Continue present medications.                        - Repeat colonoscopy in 10 years for screening unless                         any change in family history or lower GI problems. Procedure Code(s):     --- Professional ---                        [phone], Colonoscopy, flexible; diagnostic, including                         collection of specimen(s) by brushing or washing, when                         performed (separate procedure) Diagnosis Code(s):     --- Professional ---                        Z12.11, Encounter for screening for malignant neoplasm  of colon CPT copyright 2019 American Medical Association. All rights reserved. The codes documented in this report are preliminary and upon coder review may  be revised to meet current compliance requirements. [name] MD, MD 02/29/2020 10:51:10 AM This report has been signed electronically. Number of Addenda: 0 Note Initiated On: 02/29/2020 10:25 AM Scope Withdrawal Time: 0 hours 7 minutes 32 seconds  Total Procedure Duration: 0 hours 9 minutes 15 seconds  Estimated Blood Loss:  Estimated blood loss: none.      [facility]

## 2020-02-29 NOTE — Op Note (Signed)
[facility]enter[facility] Patient Name: Maureen Randall Procedure Date: 02/29/2020 10:26 AM MRN: [id number] Account #: [id number] Date of Birth: Jul 02, 1966 Admit Type: Outpatient Age: 54 Room: [facility] OR ROOM 01 Gender: Female Note Status: Finalized Procedure:             Upper GI endoscopy Indications:           Heartburn Providers:             [name] MD, MD Referring MD:          Nino Glow Mclean-Scocuzza MD, MD (Referring MD) Medicines:             Propofol per Anesthesia Complications:         No immediate complications. Procedure:             Pre-Anesthesia Assessment:                        - Prior to the procedure, a History and Physical was                         performed, and patient medications and allergies were                         reviewed. The patient's tolerance of previous                         anesthesia was also reviewed. The risks and benefits                         of the procedure and the sedation options and risks                         were discussed with the patient. All questions were                         answered, and informed consent was obtained. Prior                         Anticoagulants: The patient has taken no previous                         anticoagulant or antiplatelet agents. ASA Grade                         Assessment: II - A patient with mild systemic disease.                         After reviewing the risks and benefits, the patient                         was deemed in satisfactory condition to undergo the                         procedure.                        After obtaining informed consent, the endoscope was  passed under direct vision. Throughout the procedure,                         the patient's blood pressure, pulse, and oxygen                         saturations were monitored continuously. The                         Endosonoscope was introduced through the mouth, and                          advanced to the second part of duodenum. The upper GI                         endoscopy was accomplished without difficulty. The                         patient tolerated the procedure well. Findings:      The Z-line was irregular and was found in the lower third of the       esophagus.      A small hiatal hernia was present.      The stomach was normal.      The examined duodenum was normal. Impression:            - Z-line irregular, in the lower third of the                         esophagus.                        - Small hiatal hernia.                        - Normal stomach.                        - Normal examined duodenum.                        - No specimens collected. Recommendation:        - Discharge patient to home.                        - Resume previous diet.                        - Continue present medications.                        - Perform a colonoscopy today. Procedure Code(s):     --- Professional ---                        [phone], Esophagogastroduodenoscopy, flexible,                         transoral; diagnostic, including collection of                         specimen(s) by brushing or washing, when performed                         (  separate procedure) Diagnosis Code(s):     --- Professional ---                        R12, Heartburn CPT copyright 2019 American Medical Association. All rights reserved. The codes documented in this report are preliminary and upon coder review may  be revised to meet current compliance requirements. [name] MD, MD 02/29/2020 10:37:11 AM This report has been signed electronically. Number of Addenda: 0 Note Initiated On: 02/29/2020 10:26 AM Total Procedure Duration: 0 hours 1 minute 40 seconds  Estimated Blood Loss:  Estimated blood loss: none.      [facility]

## 2020-02-29 NOTE — Anesthesia Procedure Notes (Signed)
Procedure Name: MAC Date/Time: 02/29/2020 10:29 AM Performed by: Vanetta Shawl, CRNA Pre-anesthesia Checklist: Patient identified, Emergency Drugs available, Suction available, Timeout performed and Patient being monitored Patient Re-evaluated:Patient Re-evaluated prior to induction Oxygen Delivery Method: Nasal cannula Placement Confirmation: positive ETCO2

## 2020-02-29 NOTE — Transfer of Care (Signed)
Immediate Anesthesia Transfer of Care Note  Patient: Maureen Randall  Procedure(s) Performed: COLONOSCOPY WITH PROPOFOL (N/A Rectum) ESOPHAGOGASTRODUODENOSCOPY (EGD) WITH PROPOFOL (N/A Mouth)  Patient Location: PACU  Anesthesia Type: General  Level of Consciousness: awake, alert  and patient cooperative  Airway and Oxygen Therapy: Patient Spontanous Breathing and Patient connected to supplemental oxygen  Post-op Assessment: Post-op Vital signs reviewed, Patient's Cardiovascular Status Stable, Respiratory Function Stable, Patent Airway and No signs of Nausea or vomiting  Post-op Vital Signs: Reviewed and stable  Complications: No complications documented.

## 2020-03-01 ENCOUNTER — Encounter: Payer: Self-pay | Admitting: Gastroenterology

## 2020-03-01 ENCOUNTER — Telehealth: Payer: 59 | Admitting: Psychiatry

## 2020-03-14 ENCOUNTER — Other Ambulatory Visit: Payer: Self-pay | Admitting: Internal Medicine

## 2020-03-14 DIAGNOSIS — J309 Allergic rhinitis, unspecified: Secondary | ICD-10-CM

## 2020-03-14 MED ORDER — FLUTICASONE PROPIONATE 50 MCG/ACT NA SUSP: 1.0000 | Freq: Every day | NASAL | 12 refills | Status: DC | PRN

## 2020-03-16 ENCOUNTER — Other Ambulatory Visit: Payer: Self-pay

## 2020-03-16 MED ORDER — LINACLOTIDE 145 MCG PO CAPS: 145.0000 ug | ORAL_CAPSULE | Freq: Every day | ORAL | 0 refills | Status: DC

## 2020-03-16 NOTE — Progress Notes (Signed)
linze

## 2020-03-25 ENCOUNTER — Telehealth (INDEPENDENT_AMBULATORY_CARE_PROVIDER_SITE_OTHER): Payer: 59 | Admitting: Psychiatry

## 2020-03-25 ENCOUNTER — Encounter: Payer: Self-pay | Admitting: Psychiatry

## 2020-03-25 ENCOUNTER — Other Ambulatory Visit: Payer: Self-pay

## 2020-03-25 DIAGNOSIS — F411 Generalized anxiety disorder: Secondary | ICD-10-CM

## 2020-03-25 DIAGNOSIS — G4701 Insomnia due to medical condition: Secondary | ICD-10-CM | POA: Diagnosis not present

## 2020-03-25 NOTE — Progress Notes (Signed)
Provider Location : [facility] Patient Location : Home  Participants: Patient , Provider  Virtual Visit via Video Note  I connected with Maureen Randall on 03/25/20 at  9:20 AM EDT by a video enabled telemedicine application and verified that I am speaking with the correct person using two identifiers.   I discussed the limitations of evaluation and management by telemedicine and the availability of in person appointments. The patient expressed understanding and agreed to proceed.    I discussed the assessment and treatment plan with the patient. The patient was provided an opportunity to ask questions and all were answered. The patient agreed with the plan and demonstrated an understanding of the instructions.   The patient was advised to call back or seek an in-person evaluation if the symptoms worsen or if the condition fails to improve as anticipated.   Stigler MD OP Progress Note  03/25/2020 12:53 PM Maureen Randall  MRN:  [id number]  Chief Complaint:  Chief Complaint    Follow-up     HPI: Maureen Randall is a 54 year old Caucasian female who has a history of GAD, insomnia, chronic pain, primary osteoarthritis, lumbar radiculopathy, degenerative disc disease, spondylolisthesis, bilateral carpal tunnel syndrome, recent surgery of her foot, was evaluated by telemedicine today.  Patient reports she continues to struggle with pain.  She reports she continues to have back pain as well as recent right foot pain.  Her right foot is in braces right now.  She reports she is also starting physical therapy.  She reports she needs her husband's assistance with some of the chores around the house.  She is however able to take care of her ADLs.  She reports mood symptoms as stable.  She denies any significant mood lability or anxiety.  She started combining the trazodone 150 mg with melatonin 3 mg and that has been beneficial.  She is sleeping better.  She reports she had a colonoscopy and EGD done  recently and she currently has a diagnosis of hiatal hernia.  She reports she is currently making use of some techniques like avoiding drinking from a straw as well as drinking slowly which has helped.  She also is currently on Protonix.  Patient denies any suicidality, homicidality or perceptual disturbances.  She is compliant on all her medications and denies side effects.  Visit Diagnosis:    ICD-10-CM   1. Generalized anxiety disorder  F41.1   2. Insomnia due to medical condition  G47.01     Past Psychiatric History: I have reviewed past psychiatric history from my progress note on 04/30/2018  Past Medical History:  Past Medical History:  Diagnosis Date  . Anxiety   . Arthritis    osteoarthritis-joints, feet, shoulder  . Asthma    previously diagnosed after a bout of bronchitis/ no problems since  . Chronic pain syndrome   . COPD (chronic obstructive pulmonary disease) ([place]) 2004   patient states probably bronchitis  . Depression   . DVT (deep venous thrombosis) ([place]) 2012   right leg. after surgery, while on BCP  . GERD (gastroesophageal reflux disease)   . Hyperlipidemia   . Neuropathy    post surgery  . PVD (peripheral vascular disease) ([place])   . Spondylolisthesis     Past Surgical History:  Procedure Laterality Date  . ABLATION     veins in lower legs  . BACK SURGERY  07/2014   lumbar spine/ fusion 1 level  . BREAST BIOPSY Left 2011   neg  .  CARPAL TUNNEL RELEASE Right 12/2015   [facility]  . CARPAL TUNNEL RELEASE Left 12/2016  . COLONOSCOPY WITH PROPOFOL N/A 02/29/2020   Procedure: COLONOSCOPY WITH PROPOFOL;  Surgeon: [name], MD;  Location: [facility];  Service: Endoscopy;  Laterality: N/A;  . ESOPHAGOGASTRODUODENOSCOPY (EGD) WITH PROPOFOL N/A 02/29/2020   Procedure: ESOPHAGOGASTRODUODENOSCOPY (EGD) WITH PROPOFOL;  Surgeon: [name], MD;  Location: [facility];  Service: Endoscopy;  Laterality: N/A;  . FOOT SURGERY Right 2015    broken navicular bone  . HIP ARTHROSCOPY Right 05/2011  . HIP SURGERY     left hip surgery 05/2018  . JOINT REPLACEMENT Bilateral 12/2017 & 05/2018   hip  . MASS EXCISION Left 12/17/2019   Procedure: EXCISION TUMOR FOOT DEEP LEFT;  Surgeon: Albertine Patricia, DPM;  Location: [facility];  Service: Podiatry;  Laterality: Left;  LMA W/LOCAL  . NASAL TURBINATE REDUCTION Bilateral 06/12/2019   Procedure: TURBINATE REDUCTION/SUBMUCOSAL RESECTION;  Surgeon: [name], MD;  Location: [facility];  Service: ENT;  Laterality: Bilateral;  . 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 ROTATOR CUFF REPAIR     Dr. [name] in 04/2019   . SCLEROTHERAPY     veins in lower legs  . SEPTOPLASTY Bilateral 06/12/2019   Procedure: SEPTOPLASTY;  Surgeon: [name], MD;  Location: [place] [facility];  Service: ENT;  Laterality: Bilateral;  . SHOULDER SURGERY Right 12/2015   [facility] torn rotator cuff  . SPINAL FUSION  2016   [facility] lumbar    Family Psychiatric History: I have reviewed family psychiatric history from my progress note on 04/30/2018  Family History:  Family History  Problem Relation Age of Onset  . Breast cancer Maternal Aunt   . Breast cancer Paternal Aunt   . Dementia Paternal Aunt   . COPD Mother        quit smoking after 60 years  . Arthritis Mother   . Osteoarthritis Mother   . Dementia Father        symptoms started in his 40s  . Alzheimer's disease Father   . Rheum arthritis Father   . Rheum arthritis Other        FH  . Dementia Other        "all my dad's side of the family"  . Other Maternal Grandmother        got dementia as she got older   . Dementia Paternal Grandmother     Social History: I have reviewed social history from my progress note on 04/30/2018 Social History   Socioeconomic History  . Marital status: Married    Spouse name: fredrick  . Number of children: 1  . Years of  education: Not on file  . Highest education level: High school graduate  Occupational History    Comment: full time  Tobacco Use  . Smoking status: Never Smoker  . Smokeless tobacco: Never Used  Vaping Use  . Vaping Use: Never used  Substance and Sexual Activity  . Alcohol use: Yes    Alcohol/week: 1.0 standard drink    Types: 1 Cans of beer per week    Comment: may have drink 1x/month  . Drug use: Never  . Sexual activity: Yes  Other Topics Concern  . 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 Difficulty of Paying Living Expenses: Not on file  Food Insecurity:   . Worried About Charity fundraiser in the Last Year: Not on file  . Ran Out of Food in the Last Year: Not on file  Transportation Needs:   . Lack of Transportation (Medical): Not on file  . Lack of Transportation (Non-Medical): Not on file  Physical Activity:   . Days of Exercise per Week: Not on file  . Minutes of Exercise per Session: Not on file  Stress:   . Feeling of Stress : Not on file  Social Connections:   . Frequency of Communication with Friends and Family: Not on file  . Frequency of Social Gatherings with Friends and Family: Not on file  . Attends Religious Services: Not on file  . Active Member of Clubs or Organizations: Not on file  . Attends Archivist Meetings: Not on file  . Marital Status: Not on file    Allergies:  Allergies  Allergen Reactions  . Amoxicillin Hives  . Septra [Sulfamethoxazole-Trimethoprim] Hives       . Zanaflex [Tizanidine]     Weak muscles   . Cephalexin Rash         Metabolic Disorder Labs: Lab Results  Component Value Date   HGBA1C 5.5 09/25/2019   No results found for: PROLACTIN Lab Results  Component Value Date   CHOL 217  (H) 09/25/2019   TRIG 81.0 09/25/2019   HDL 69.10 09/25/2019   CHOLHDL 3 09/25/2019   VLDL 16.2 09/25/2019   LDLCALC 132 (H) 09/25/2019   LDLCALC 156 (H) 01/05/2019   Lab Results  Component Value Date   TSH 3.78 01/05/2019    Therapeutic Level Labs: No results found for: LITHIUM No results found for: VALPROATE No components found for:  CBMZ  Current Medications: Current Outpatient Medications  Medication Sig Dispense Refill  . baclofen (LIORESAL) 10 MG tablet Take 10 mg by mouth 3 (three) times daily as needed for muscle spasms.    . busPIRone (BUSPAR) 10 MG tablet Take 10 mg by mouth 2 (two) times daily.    . celecoxib (CELEBREX) 100 MG capsule Take 1 capsule (100 mg total) by mouth daily. (Patient not taking: Reported on 02/22/2020) 90 capsule 3  . diclofenac Sodium (VOLTAREN) 1 % GEL Apply 2-4 g topically 4 (four) times daily. 2 gram upper body qid prn and 4 gram lower body qid prn 100 g 11  . DULoxetine (CYMBALTA) 60 MG capsule TAKE 1 CAPSULE BY MOUTH EVERY DAY 90 capsule 1  . Elastic Bandages & Supports (RELIEF KNEE) MISC Apply in the morning and remove at night.    . estradiol (ESTRACE) 0.1 MG/GM vaginal cream INSERT PEA SIZED AMOUNT (0.5G) INTO VAGINA NIGHTLY EVERY OTHER NIGHT FOR 2 WEEKS, THEN TWICE WEEKLY    . fexofenadine (ALLEGRA) 180 MG tablet Take 180 mg by mouth daily. (Patient not taking: Reported on 02/22/2020)    . fluticasone (FLONASE) 50 MCG/ACT nasal spray Place 1-2 sprays into both nostrils daily as needed. Max dose 2 sprays each nostril 16 g 12  . HORIZANT 600 MG TBCR Take 1 tablet by mouth 2 (two) times daily.    [name] ibuprofen (ADVIL,MOTRIN) 800 MG tablet TK 1 T PO Q 8 H WF PRN P  2  . linaclotide (LINZESS) 145 MCG CAPS capsule Take 1 capsule (145  mcg total) by mouth daily before breakfast. 30 capsule 0  . lubiprostone (AMITIZA) 24 MCG capsule Take 1 capsule (24 mcg total) by mouth 2 (two) times daily with a meal. 60 capsule 3  . Melatonin 3 MG CAPS Take by mouth.     . meloxicam (MOBIC) 15 MG tablet Take 15 mg by mouth daily.    . methocarbamol (ROBAXIN) 500 MG tablet Take 2 tablets (1,000 mg total) by mouth 2 (two) times daily as needed for muscle spasms. (Patient taking differently: Take 1,000 mg by mouth every 6 (six) hours as needed for muscle spasms. ) 60 tablet 2  . Multiple Vitamin (MULTIVITAMIN) tablet Take 1 tablet by mouth daily.     . nitrofurantoin (MACRODANTIN) 50 MG capsule Take 1 capsule (50 mg total) by mouth as needed. UTI dose should be 100 mg bid x 5 days UTI  or 50-100 mg qhs prevention 120 capsule 3  . omeprazole (PRILOSEC) 40 MG capsule Take 1 capsule (40 mg total) by mouth daily. 30 min before food (Patient not taking: Reported on 02/22/2020) 90 capsule 3  . oxyCODONE-acetaminophen (PERCOCET) 10-325 MG tablet Take 1 tablet by mouth every 6 (six) hours as needed.    . pantoprazole (PROTONIX) 40 MG tablet Take 1 tablet (40 mg total) by mouth daily. 30 tablet 6  . phentermine (ADIPEX-P) 37.5 MG tablet Take 1 tablet (37.5 mg total) by mouth daily before breakfast. 60 tablet 0  . Sodium Sulfate-Mag Sulfate-KCl (SUTAB) [phone] MG TABS Take 376 mg by mouth as directed. 24 tablet 0  . traZODone (DESYREL) 100 MG tablet TAKE 2 TABLETS (200 MG TOTAL) BY MOUTH AT BEDTIME AS NEEDED FOR SLEEP. 180 tablet 1  . valACYclovir (VALTREX) 1000 MG tablet as needed. Oral blister     No current facility-administered medications for this visit.     Musculoskeletal: Strength & Muscle Tone: UTA Gait & Station: normal currently has leg brace- right side Patient leans: N/A  Psychiatric Specialty Exam: Review of Systems  Musculoskeletal: Positive for arthralgias and back pain.       Rt. Sided foot pain  Psychiatric/Behavioral: Negative for agitation, behavioral problems, confusion, decreased concentration, dysphoric mood, hallucinations, self-injury, sleep disturbance and suicidal ideas. The patient is not nervous/anxious and is not hyperactive.   All  other systems reviewed and are negative.   There were no vitals taken for this visit.There is no height or weight on file to calculate BMI.  General Appearance: Casual  Eye Contact:  Fair  Speech:  Clear and Coherent  Volume:  Normal  Mood:  Euthymic  Affect:  Congruent  Thought Process:  Goal Directed and Descriptions of Associations: Intact  Orientation:  Full (Time, Place, and Person)  Thought Content: Logical   Suicidal Thoughts:  No  Homicidal Thoughts:  No  Memory:  Immediate;   Fair Recent;   Fair Remote;   Fair  Judgement:  Fair  Insight:  Fair  Psychomotor Activity:  Normal  Concentration:  Concentration: Fair and Attention Span: Fair  Recall:  [employer] of Knowledge: Fair  Language: Fair  Akathisia:  No  Handed:  Right  AIMS (if indicated): uta  Assets:  Communication Skills Desire for Improvement Housing Social Support  ADL's:  Intact  Cognition: WNL  Sleep:  Fair   Screenings: GAD-7     Video Visit from 10/14/2019 in [facility] [address]  Total GAD-7 Score 1    Mini-Mental     Office Visit from 09/23/2019 in [facility]  Total Score (max 30 points ) 27    PHQ2-9     Video Visit from 10/14/2019 in [facility] Office Visit from 08/14/2019 in [facility] Office Visit from 07/07/2019 in [facility] Office Visit from 03/13/2019 in [place] from 02/11/2018 in [place]  PHQ-2 Total Score 0 0 0 0 0  PHQ-9 Total Score -- 0 -- 0 --       Assessment and Plan: JOLLEEN SEMAN is a 54 year old Caucasian female, married, employed, currently on SSD, lives in [place], has a history of GAD, insomnia, chronic pain, osteoarthritis, degenerative disc disease, lumbar radiculopathy, bilateral carpal tunnel syndrome, multiple other surgeries was evaluated by telemedicine today.  Patient continues to have  psychosocial stressors of chronic pain.  She however is currently making progress with regards to her mood.  Plan as noted below.  Plan GAD-stable Cymbalta 60 mg p.o. daily  BuSpar 10 mg p.o. twice daily Hydroxyzine 25 to 50 mg p.o. daily as needed for severe anxiety attacks Continue psychotherapy sessions- Ms.Hussami  Insomnia-stable Trazodone 150 mg p.o. nightly as needed Melatonin 3 mg p.o. nightly  Follow-up in clinic in 2 to 3 months or sooner if needed.  I have spent atleast 20 minutes face to face with patient today. More than 50 % of the time was spent for preparing to see the patient ( e.g., review of test, records ),  ordering medications and test ,psychoeducation and supportive psychotherapy and care coordination,as well as documenting clinical information in electronic health record. This note was generated in part or whole with voice recognition software. Voice recognition is usually quite accurate but there are transcription errors that can and very often do occur. I apologize for any typographical errors that were not detected and corrected.     Ursula Alert, MD 03/25/2020, 12:53 PM

## 2020-03-28 ENCOUNTER — Other Ambulatory Visit: Payer: Self-pay | Admitting: Orthopaedic Surgery

## 2020-03-28 DIAGNOSIS — M4716 Other spondylosis with myelopathy, lumbar region: Secondary | ICD-10-CM

## 2020-03-28 DIAGNOSIS — M4326 Fusion of spine, lumbar region: Secondary | ICD-10-CM

## 2020-03-29 ENCOUNTER — Encounter: Payer: Self-pay | Admitting: Internal Medicine

## 2020-03-29 ENCOUNTER — Other Ambulatory Visit: Payer: Self-pay

## 2020-03-29 MED ORDER — PANTOPRAZOLE SODIUM 40 MG PO TBEC
40.0000 mg | DELAYED_RELEASE_TABLET | Freq: Every day | ORAL | 3 refills | Status: DC
Start: 2020-03-29 — End: 2020-06-29

## 2020-04-08 ENCOUNTER — Ambulatory Visit
Admission: RE | Admit: 2020-04-08 | Discharge: 2020-04-08 | Disposition: A | Discharge status: Home / Self Care | Payer: 59 | Source: Ambulatory Visit | Attending: Orthopaedic Surgery | Admitting: Orthopaedic Surgery

## 2020-04-08 ENCOUNTER — Other Ambulatory Visit: Payer: Self-pay

## 2020-04-08 DIAGNOSIS — M4326 Fusion of spine, lumbar region: Secondary | ICD-10-CM | POA: Diagnosis not present

## 2020-04-08 DIAGNOSIS — M4716 Other spondylosis with myelopathy, lumbar region: Secondary | ICD-10-CM | POA: Diagnosis present

## 2020-04-08 IMAGING — CT CT L SPINE W/O CM
3 of 4 series · 11 of 33 positions shown, 13 images · non-contrast
Comparison: [REDACTED] [HOSPITAL] [HOSPITAL]
Lumbar radiographs [DATE]. Intraoperative radiographs
[DATE].

CLINICAL DATA: 54-year-old female with severe back pain radiating
down the right leg, involving the sacrum. Prior surgery.

EXAM:
CT LUMBAR SPINE WITHOUT CONTRAST
TECHNIQUE: Multidetector CT imaging of the lumbar spine was performed without
intravenous contrast administration. Multiplanar CT image
reconstructions were also generated.

[Series 4: (person_name) 2.00 · axial · 0.32mm/px · z∈[-1373,-1197]mm · 3 of 133 slices shown, 4 images]
[im 23/133  soft-tissue]
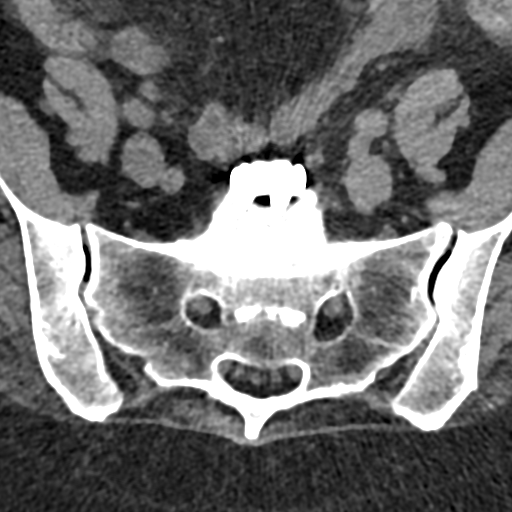
[im 23/133  bone]
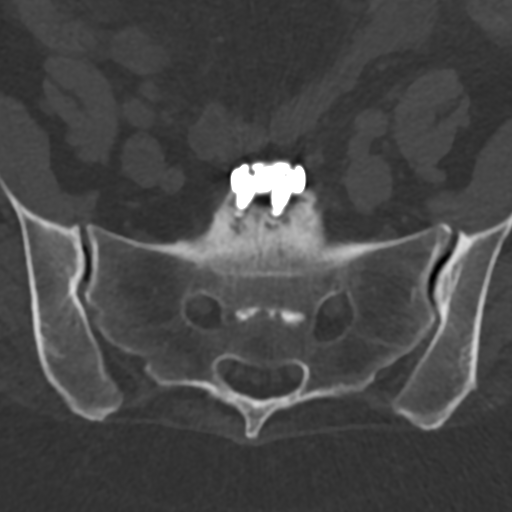
[im 67/133  bone]
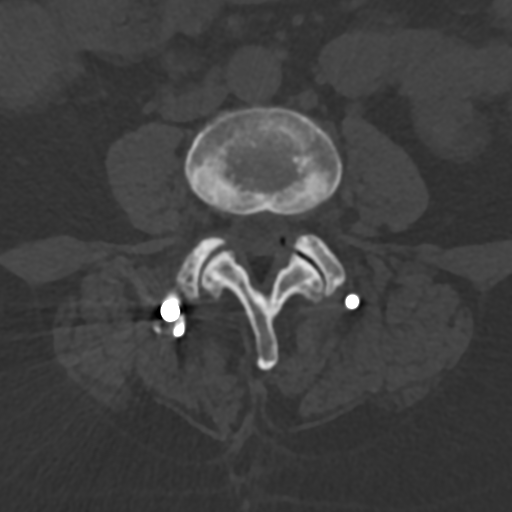
[im 111/133  bone]
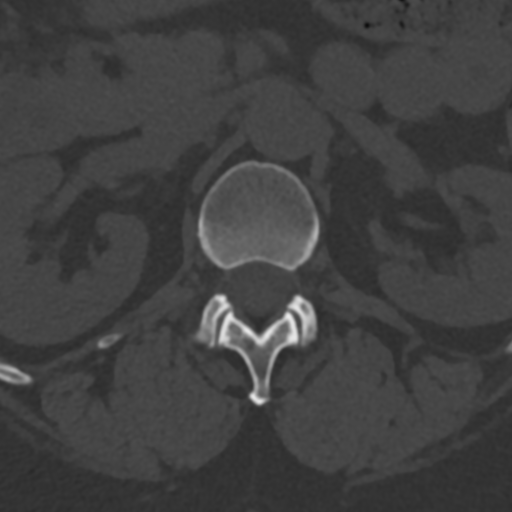

[Series 6: sag bone l-spine 2.00 sag · sagittal · 0.32mm/px · 5 of 78 slices shown, 6 images]
[im 26/78  bone]
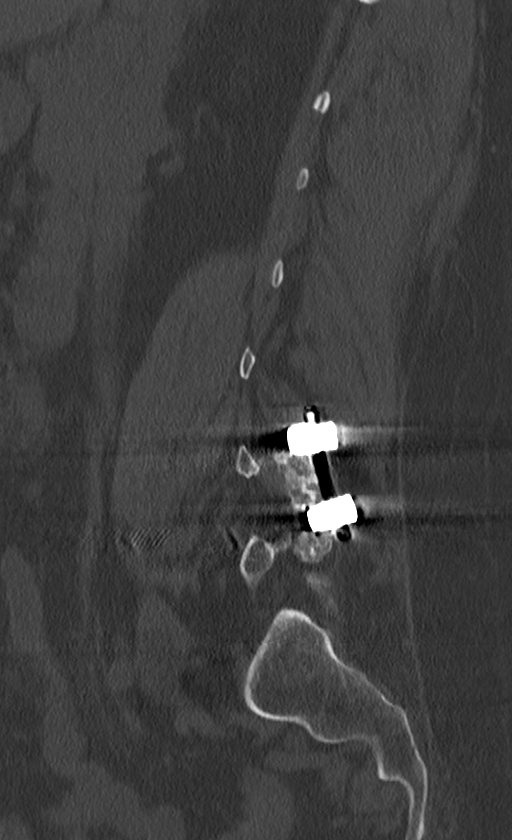
[im 33/78  bone]
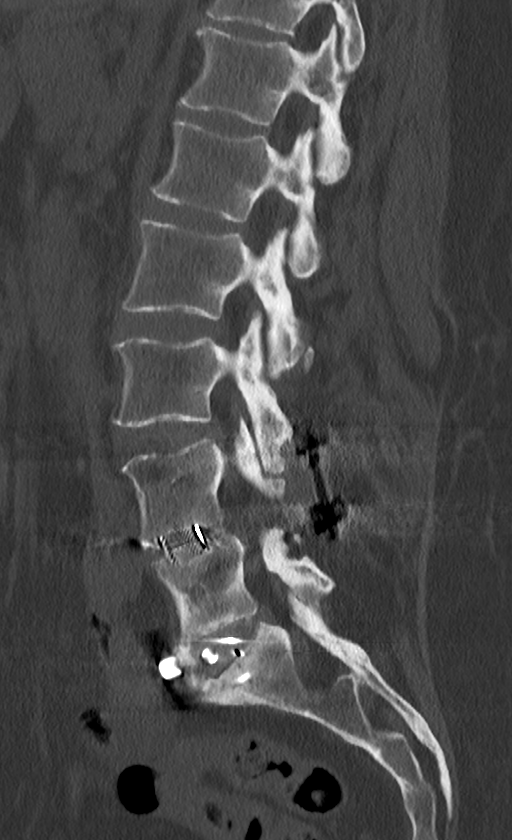
[im 39/78  soft-tissue]
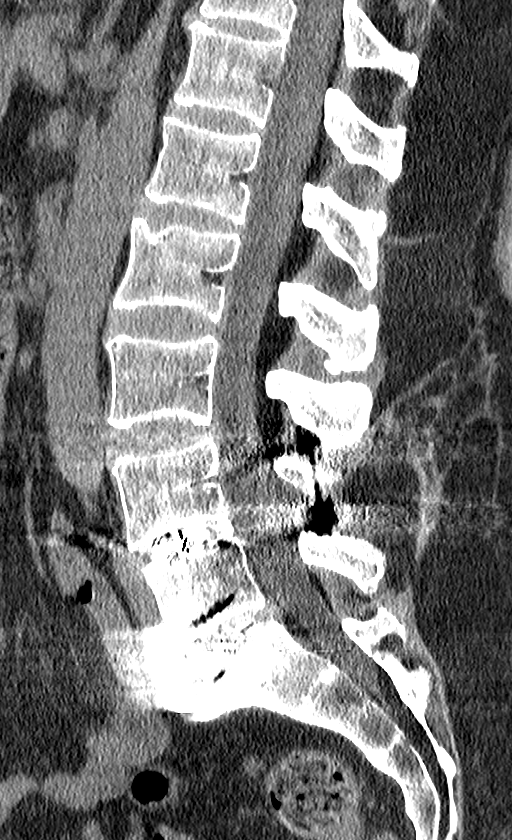
[im 39/78  bone]
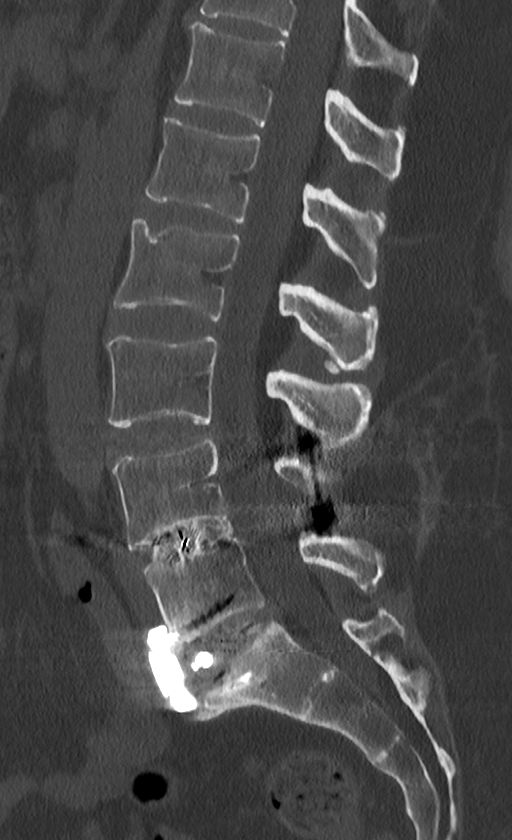
[im 45/78  bone]
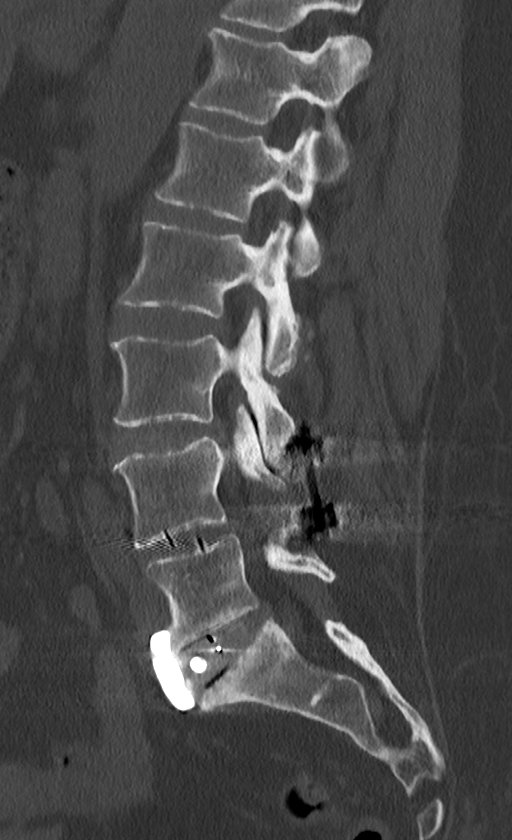
[im 52/78  bone]
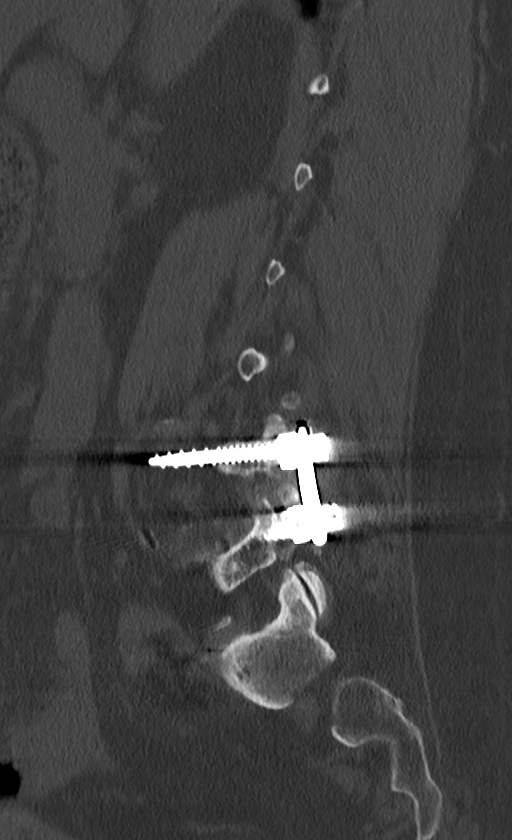

[Series 8: cor bone l-spine 2.00 cor · coronal · 0.32mm/px · 3 of 81 slices shown]
[im 17/81  bone]
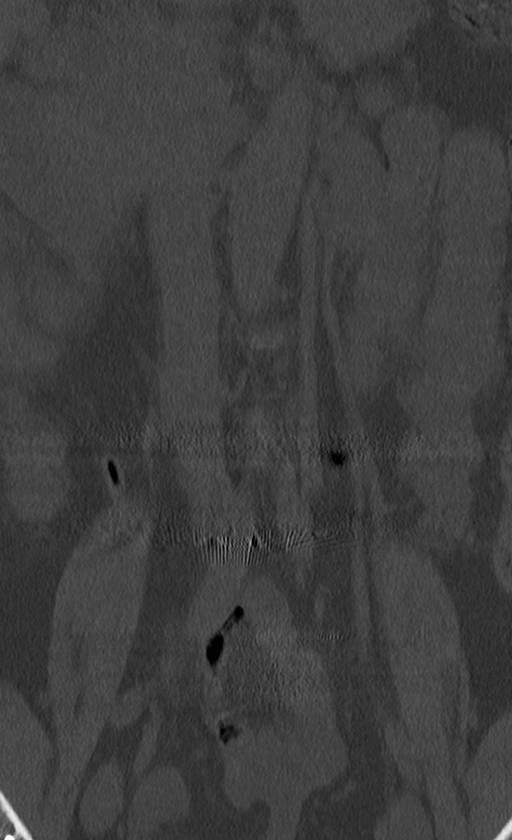
[im 33/81  bone]
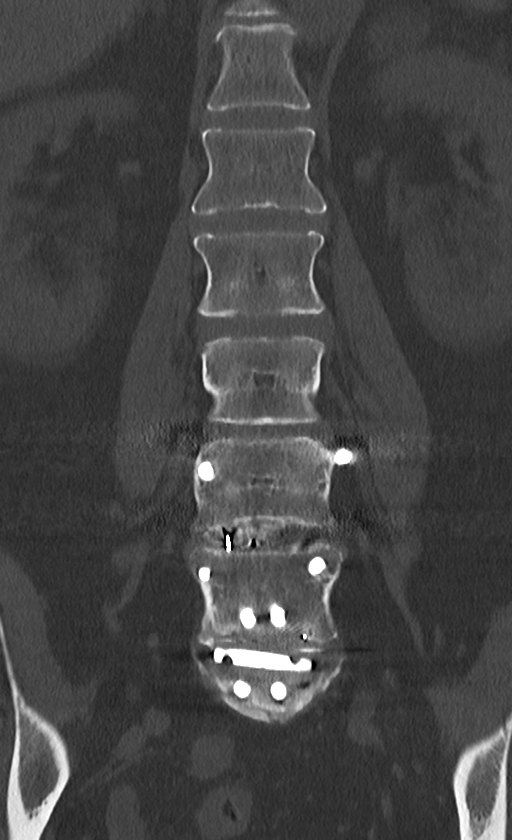
[im 49/81  bone]
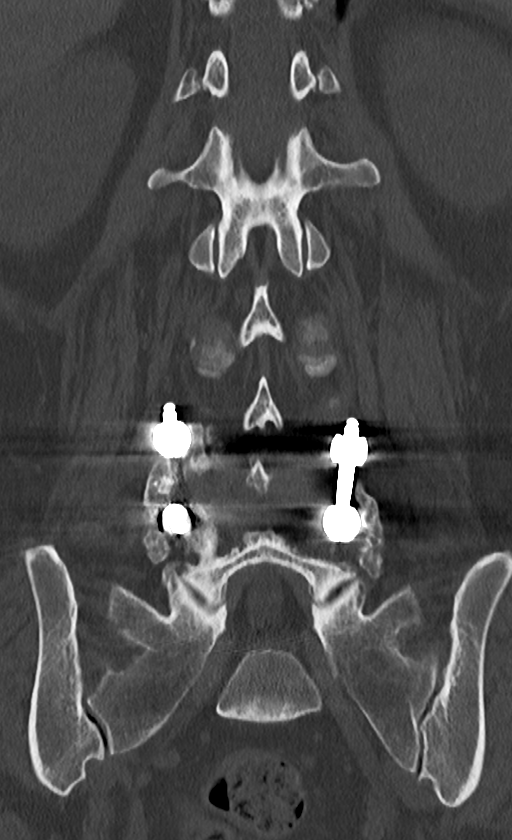

[11 of 33 positions shown; findings below may reference images not displayed]

FINDINGS: Segmentation: Normal.

Alignment: Preserved lumbar lordosis.  No spondylolisthesis.

Vertebrae: Postoperative details are below. No acute osseous
abnormality identified. Visible sacrum and SI joints appear intact.

Paraspinal and other soft tissues: Low-density 2.5 cm left adrenal
nodule consistent with benign adenoma (series 4, image 6). Negative
visible other noncontrast abdominal and pelvic viscera.
Postoperative changes to the posterior paraspinal soft tissues with
no adverse features.

Disc levels: T11-T12: Mild facet hypertrophy on the right.

T12-L1:  Negative.

L1-L2:  Negative.

L2-L3: Minor disc bulging. Mild to moderate facet hypertrophy
greater on the left. Mild vacuum facet on the left. No significant
stenosis.

L3-L4: Circumferential disc bulge. Mild to moderate posterior
element hypertrophy. Small focus of gas along the left
posterolateral thecal sac, probably a small synovial cyst on that
side (series 4, image 67). But no convincing right side neural
impingement.

L4-L5: Sequelae of decompression and fusion. Pedicle screws and
interbody implant appear intact. Somewhat lateral position of the
left L4 screw (series 5, image 73). Solid appearing right side
posterior element arthrodesis. Scant interbody arthrodesis. No
evidence of stenosis.

L5-S1: Anterior interbody fusion hardware. Mild lucency along the S1
screws (series 5, images 108 and 109). But there is evidence of
developing interbody calcification. Mild to moderate facet
hypertrophy greater on the right. Mild epidural lipomatosis. No
stenosis.
IMPRESSION: 1. Sequelae of decompression and fusion at L4-L5 with solid
appearing right side posterior element arthrodesis. No evidence of
recurrent stenosis.

2. Sequelae of anterior interbody fusion at L5-S1 with questionable
loosening of the S1 screws. But there is interbody calcification
although no convincing solid arthrodesis at this time.

3. No acute osseous abnormality in the lumbar spine.

4. Adjacent segment disease at L3-L4 with facet arthropathy but no
convincing spinal stenosis or right side neural impingement.

5. Benign left adrenal adenoma, does not require follow-up.

## 2020-05-24 DIAGNOSIS — G5732 Lesion of lateral popliteal nerve, left lower limb: Secondary | ICD-10-CM | POA: Insufficient documentation

## 2020-05-24 HISTORY — DX: Lesion of lateral popliteal nerve, left lower limb: G57.32

## 2020-06-08 ENCOUNTER — Other Ambulatory Visit: Payer: Self-pay | Admitting: Psychiatry

## 2020-06-08 DIAGNOSIS — F411 Generalized anxiety disorder: Secondary | ICD-10-CM

## 2020-06-08 DIAGNOSIS — G4701 Insomnia due to medical condition: Secondary | ICD-10-CM

## 2020-06-10 ENCOUNTER — Encounter: Payer: Self-pay | Admitting: Psychiatry

## 2020-06-10 ENCOUNTER — Other Ambulatory Visit: Payer: Self-pay

## 2020-06-10 ENCOUNTER — Telehealth (INDEPENDENT_AMBULATORY_CARE_PROVIDER_SITE_OTHER): Payer: 59 | Admitting: Psychiatry

## 2020-06-10 DIAGNOSIS — G4701 Insomnia due to medical condition: Secondary | ICD-10-CM

## 2020-06-10 DIAGNOSIS — F411 Generalized anxiety disorder: Secondary | ICD-10-CM

## 2020-06-10 MED ORDER — TRAZODONE HCL 100 MG PO TABS: 200.0000 mg | ORAL_TABLET | Freq: Every evening | ORAL | 1 refills | Status: DC | PRN

## 2020-06-10 NOTE — Progress Notes (Signed)
Virtual Visit via Video Note  I connected with Maureen Randall on 06/10/20 at 10:00 AM EST by a video enabled telemedicine application and verified that I am speaking with the correct person using two identifiers.  Location Provider Location : [facility] Patient Location : Home  Participants: Patient , Provider   I discussed the limitations of evaluation and management by telemedicine and the availability of in person appointments. The patient expressed understanding and agreed to proceed.    I discussed the assessment and treatment plan with the patient. The patient was provided an opportunity to ask questions and all were answered. The patient agreed with the plan and demonstrated an understanding of the instructions.   The patient was advised to call back or seek an in-person evaluation if the symptoms worsen or if the condition fails to improve as anticipated.   [facility] MD OP Progress Note  06/10/2020 10:29 AM Maureen Randall  MRN:  [id number]  Chief Complaint:  Chief Complaint    Follow-up     HPI: Maureen Randall is a 54 year old Caucasian female who has a history of GAD, insomnia, chronic pain, primary osteoarthritis, lumbar radiculopathy, degenerative disc disease, spondylolisthesis, bilateral carpal tunnel syndrome, recent surgery of her foot, was evaluated by telemedicine today.  Patient reports she had another surgery of her left foot this Monday.  She reports she was struggling with a lot of pain.  She reports she currently wears a boot which can be painful due to the swelling.  She is currently trying to get rest and stay home.  Per review of medical records from Dr. Kevan Rosebush 06/06/2020-patient presented to neurosurgery with chief complaint of left foot pain.  Patient will benefit from deep peroneal sensory neuroma resection with allograft repair versus neurectomy.'  Patient reports she is having some sleep problems on the gabapentin.  The gabapentin is an extended release and ever  since taking it she has been staying up at night.  She wants to talk to her pain provider about taking her off of it.  She reports otherwise trazodone as beneficial.  She continues to be compliant on Cymbalta.  Denies side effects.  She reports she stopped taking the BuSpar since she did not feel it was beneficial.  Patient denies any significant anxiety or depressive symptoms.  She reports she got approved for Social Security disability and that is a relief.  Patient denies any suicidality, homicidality or perceptual disturbances.  Patient denies any other concerns today.  Visit Diagnosis:    ICD-10-CM   1. Generalized anxiety disorder  F41.1   2. Insomnia due to medical condition  G47.01 traZODone (DESYREL) 100 MG tablet   pain,medication side effect    Past Psychiatric History: I have reviewed past psychiatric history from my progress note on 04/30/2018  Past Medical History:  Past Medical History:  Diagnosis Date  . Anxiety   . Arthritis    osteoarthritis-joints, feet, shoulder  . Asthma    previously diagnosed after a bout of bronchitis/ no problems since  . Chronic pain syndrome   . COPD (chronic obstructive pulmonary disease) ([place]) 2004   patient states probably bronchitis  . Depression   . DVT (deep venous thrombosis) ([place]) 2012   right leg. after surgery, while on BCP  . GERD (gastroesophageal reflux disease)   . Hyperlipidemia   . Neuropathy    post surgery  . PVD (peripheral vascular disease) ([place])   . Spondylolisthesis     Past Surgical History:  Procedure Laterality  Date  . ABLATION     veins in lower legs  . BACK SURGERY  07/2014   lumbar spine/ fusion 1 level  . BREAST BIOPSY Left 2011   neg  . CARPAL TUNNEL RELEASE Right 12/2015   [facility]  . CARPAL TUNNEL RELEASE Left 12/2016  . COLONOSCOPY WITH PROPOFOL N/A 02/29/2020   Procedure: COLONOSCOPY WITH PROPOFOL;  Surgeon: [name], MD;  Location: [facility];  Service: Endoscopy;   Laterality: N/A;  . ESOPHAGOGASTRODUODENOSCOPY (EGD) WITH PROPOFOL N/A 02/29/2020   Procedure: ESOPHAGOGASTRODUODENOSCOPY (EGD) WITH PROPOFOL;  Surgeon: [name], MD;  Location: [facility];  Service: Endoscopy;  Laterality: N/A;  . FOOT SURGERY Right 2015   broken navicular bone  . HIP ARTHROSCOPY Right 05/2011  . HIP SURGERY     left hip surgery 05/2018  . JOINT REPLACEMENT Bilateral 12/2017 & 05/2018   hip  . MASS EXCISION Left 12/17/2019   Procedure: EXCISION TUMOR FOOT DEEP LEFT;  Surgeon: Albertine Patricia, DPM;  Location: [facility];  Service: Podiatry;  Laterality: Left;  LMA W/LOCAL  . NASAL TURBINATE REDUCTION Bilateral 06/12/2019   Procedure: TURBINATE REDUCTION/SUBMUCOSAL RESECTION;  Surgeon: [name], MD;  Location: [facility];  Service: ENT;  Laterality: Bilateral;  . 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 ROTATOR CUFF REPAIR     Dr. [name] in 04/2019   . SCLEROTHERAPY     veins in lower legs  . SEPTOPLASTY Bilateral 06/12/2019   Procedure: SEPTOPLASTY;  Surgeon: [name], MD;  Location: [facility];  Service: ENT;  Laterality: Bilateral;  . SHOULDER SURGERY Right 12/2015   [facility] torn rotator cuff  . SPINAL FUSION  2016   [facility] lumbar    Family Psychiatric History: I have reviewed family psychiatric history from my progress note on 04/30/2018  Family History:  Family History  Problem Relation Age of Onset  . Breast cancer Maternal Aunt   . Breast cancer Paternal Aunt   . Dementia Paternal Aunt   . COPD Mother        quit smoking after 60 years  . Arthritis Mother   . Osteoarthritis Mother   . Dementia Father        symptoms started in his 40s  . Alzheimer's disease Father   . Rheum arthritis Father   . Rheum arthritis Other        FH  . Dementia Other        "all my dad's side of the family"  . Other Maternal Grandmother        got dementia as she got  older   . Dementia Paternal Grandmother     Social History: Reviewed social history from my progress note on 04/30/2018 Social History   Socioeconomic History  . Marital status: Married    Spouse name: fredrick  . Number of children: 1  . Years of education: Not on file  . Highest education level: High school graduate  Occupational History    Comment: full time  Tobacco Use  . Smoking status: Never Smoker  . Smokeless tobacco: Never Used  Vaping Use  . Vaping Use: Never used  Substance and Sexual Activity  . Alcohol use: Yes    Alcohol/week: 1.0 standard drink    Types: 1 Cans of beer per week    Comment: may have drink 1x/month  . Drug use: Never  . Sexual activity: Yes  Other Topics Concern  .  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 Difficulty of Paying Living Expenses: Not on file  Food Insecurity:   . Worried About Charity fundraiser in the Last Year: Not on file  . Ran Out of Food in the Last Year: Not on file  Transportation Needs:   . Lack of Transportation (Medical): Not on file  . Lack of Transportation (Non-Medical): Not on file  Physical Activity:   . Days of Exercise per Week: Not on file  . Minutes of Exercise per Session: Not on file  Stress:   . Feeling of Stress : Not on file  Social Connections:   . Frequency of Communication with Friends and Family: Not on file  . Frequency of Social Gatherings with Friends and Family: Not on file  . Attends Religious Services: Not on file  . Active Member of Clubs or Organizations: Not on file  . Attends Archivist Meetings: Not on file  . Marital Status: Not on file    Allergies:  Allergies  Allergen Reactions  . Amoxicillin Hives  . Septra [Sulfamethoxazole-Trimethoprim]  Hives       . Zanaflex [Tizanidine]     Weak muscles   . Cephalexin Rash         Metabolic Disorder Labs: Lab Results  Component Value Date   HGBA1C 5.5 09/25/2019   No results found for: PROLACTIN Lab Results  Component Value Date   CHOL 217 (H) 09/25/2019   TRIG 81.0 09/25/2019   HDL 69.10 09/25/2019   CHOLHDL 3 09/25/2019   VLDL 16.2 09/25/2019   LDLCALC 132 (H) 09/25/2019   LDLCALC 156 (H) 01/05/2019   Lab Results  Component Value Date   TSH 3.78 01/05/2019    Therapeutic Level Labs: No results found for: LITHIUM No results found for: VALPROATE No components found for:  CBMZ  Current Medications: Current Outpatient Medications  Medication Sig Dispense Refill  . baclofen (LIORESAL) 10 MG tablet Take 10 mg by mouth 3 (three) times daily as needed for muscle spasms.    . celecoxib (CELEBREX) 100 MG capsule Take 1 capsule (100 mg total) by mouth daily. (Patient not taking: Reported on 02/22/2020) 90 capsule 3  . diclofenac Sodium (VOLTAREN) 1 % GEL Apply 2-4 g topically 4 (four) times daily. 2 gram upper body qid prn and 4 gram lower body qid prn 100 g 11  . doxycycline (VIBRAMYCIN) 100 MG capsule Take 100 mg by mouth 2 (two) times daily.    . DULoxetine (CYMBALTA) 60 MG capsule TAKE 1 CAPSULE BY MOUTH EVERY DAY 90 capsule 1  . Elastic Bandages & Supports (RELIEF KNEE) MISC Apply in the morning and remove at night.    . estradiol (ESTRACE) 0.1 MG/GM vaginal cream INSERT PEA SIZED AMOUNT (0.5G) INTO VAGINA NIGHTLY EVERY OTHER NIGHT FOR 2 WEEKS, THEN TWICE WEEKLY    . fexofenadine (ALLEGRA) 180 MG tablet Take 180 mg by mouth daily. (Patient not taking: Reported on 02/22/2020)    . fluticasone (FLONASE) 50 MCG/ACT nasal spray Place 1-2 sprays into both nostrils daily as needed. Max dose 2 sprays each nostril 16 g 12  . HORIZANT 600 MG TBCR Take 1 tablet by mouth 2 (two) times daily.    [name]  ibuprofen (ADVIL,MOTRIN) 800 MG tablet TK 1 T PO Q 8 H WF PRN P  2  . linaclotide  (LINZESS) 145 MCG CAPS capsule Take 1 capsule (145 mcg total) by mouth daily before breakfast. 30 capsule 0  . lubiprostone (AMITIZA) 24 MCG capsule Take 1 capsule (24 mcg total) by mouth 2 (two) times daily with a meal. 60 capsule 3  . Melatonin 3 MG CAPS Take by mouth.    . meloxicam (MOBIC) 15 MG tablet Take 15 mg by mouth daily.    . methocarbamol (ROBAXIN) 500 MG tablet Take 2 tablets (1,000 mg total) by mouth 2 (two) times daily as needed for muscle spasms. (Patient taking differently: Take 1,000 mg by mouth every 6 (six) hours as needed for muscle spasms. ) 60 tablet 2  . Multiple Vitamin (MULTIVITAMIN) tablet Take 1 tablet by mouth daily.     . nitrofurantoin (MACRODANTIN) 50 MG capsule Take 1 capsule (50 mg total) by mouth as needed. UTI dose should be 100 mg bid x 5 days UTI  or 50-100 mg qhs prevention 120 capsule 3  . nitrofurantoin (MACRODANTIN) 50 MG capsule Take by mouth.    [name] omeprazole (PRILOSEC) 40 MG capsule Take 1 capsule (40 mg total) by mouth daily. 30 min before food (Patient not taking: Reported on 02/22/2020) 90 capsule 3  . oxyCODONE-acetaminophen (PERCOCET) 10-325 MG tablet Take 1 tablet by mouth every 6 (six) hours as needed.    . pantoprazole (PROTONIX) 40 MG tablet Take 1 tablet (40 mg total) by mouth daily. 90 tablet 3  . phentermine (ADIPEX-P) 37.5 MG tablet Take 1 tablet (37.5 mg total) by mouth daily before breakfast. 60 tablet 0  . Sodium Sulfate-Mag Sulfate-KCl (SUTAB) 9124751927 MG TABS Take 376 mg by mouth as directed. 24 tablet 0  . traZODone (DESYREL) 100 MG tablet Take 2 tablets (200 mg total) by mouth at bedtime as needed for sleep. 180 tablet 1  . valACYclovir (VALTREX) 1000 MG tablet as needed. Oral blister     No current facility-administered medications for this visit.     Musculoskeletal: Strength & Muscle Tone: UTA Gait & Station: UTA Patient leans: N/A  Psychiatric Specialty Exam: Review of Systems  Musculoskeletal:       Left foot pain -  S/P surgery  Psychiatric/Behavioral: Positive for sleep disturbance.  All other systems reviewed and are negative.   There were no vitals taken for this visit.There is no height or weight on file to calculate BMI.  General Appearance: Casual  Eye Contact:  Fair  Speech:  Clear and Coherent  Volume:  Normal  Mood:  Euthymic  Affect:  Congruent  Thought Process:  Goal Directed and Descriptions of Associations: Intact  Orientation:  Full (Time, Place, and Person)  Thought Content: Logical   Suicidal Thoughts:  No  Homicidal Thoughts:  No  Memory:  Immediate;   Fair Recent;   Fair Remote;   Fair  Judgement:  Fair  Insight:  Fair  Psychomotor Activity:  Normal  Concentration:  Concentration: Fair and Attention Span: Fair  Recall:  [employer] of Knowledge: Fair  Language: Fair  Akathisia:  No  Handed:  Right  AIMS (if indicated): UTA  Assets:  Communication Skills Desire for Improvement Housing Intimacy Social Support  ADL's:  Intact  Cognition: WNL  Sleep:  Restless   Screenings: GAD-7     Video Visit from 10/14/2019 in [facility]  Total GAD-7 Score 1    Mini-Mental  Office Visit from 09/23/2019 in [facility]  Total Score (max 30 points ) 27    PHQ2-9     Video Visit from 10/14/2019 in [facility] Office Visit from 08/14/2019 in [facility] Office Visit from 07/07/2019 in [facility] Office Visit from 03/13/2019 in [facility] from 02/11/2018 in [facility]  PHQ-2 Total Score 0 0 0 0 0  PHQ-9 Total Score -- 0 -- 0 --       Assessment and Plan: NKENGE SONNTAG is a 54 year old Caucasian female, married, currently on SSD, lives in [place], has a history of GAD, insomnia, chronic pain, osteoarthritis, degenerative disc disease, lumbar radiculopathy, bilateral carpal tunnel syndrome, multiple  surgery including recent left foot surgery few days ago was evaluated by telemedicine today.  Patient continues to be in pain although improving.  Patient also likely with adverse side effects to gabapentin.  Plan as noted below.  Plan GAD-stable Cymbalta 60 mg p.o. daily Discontinue BuSpar for noncompliance Hydroxyzine 25 to 50 mg p.o. daily as needed for severe anxiety attacks Continue CBT as needed  Insomnia-restless Discussed with patient to take the gabapentin early to see if that will help with sleep at night. She will also talk to her pain provider about stopping gabapentin. Continue trazodone 150 to 200 mg at bedtime as needed Melatonin 3 mg p.o. nightly   I have reviewed medical records from Dr. Tamala Julian as noted above.  Follow-up in clinic in 4 weeks or sooner if needed.  I have spent atleast 20 minutes  face to face by video with patient today. More than 50 % of the time was spent for preparing to see the patient ( e.g., review of test, records ), obtaining and to review and separately obtained history , ordering medications and test ,psychoeducation and supportive psychotherapy and care coordination,as well as documenting clinical information in electronic health record. This note was generated in part or whole with voice recognition software. Voice recognition is usually quite accurate but there are transcription errors that can and very often do occur. I apologize for any typographical errors that were not detected and corrected.      Ursula Alert, MD 06/10/2020, 10:29 AM

## 2020-06-16 ENCOUNTER — Telehealth: Payer: Self-pay | Admitting: Gastroenterology

## 2020-06-16 NOTE — Telephone Encounter (Signed)
I am sorry. These symptoms are a bit to vague to tell her what they are. If she can be seen would be better. Cant tell from the description if it is her skin, muscle, hernia or muscular causing the burning.

## 2020-06-16 NOTE — Telephone Encounter (Signed)
Patient states she is having a burning feeling in a spot around her naval. This has now been going on for 2 weeks and she wants to know if Dr. Allen Norris has any suggestions. Pt states when you touch the area it burns, but she has been taking her linzess so she knows it is not acid reflux. Pt last seen 8.2021. Please advise.

## 2020-06-16 NOTE — Telephone Encounter (Signed)
MyChart message with Dr. Dorothey Baseman response.

## 2020-06-19 ENCOUNTER — Encounter: Payer: Self-pay | Admitting: Emergency Medicine

## 2020-06-19 ENCOUNTER — Other Ambulatory Visit: Payer: Self-pay

## 2020-06-19 ENCOUNTER — Emergency Department
Admission: EM | Admit: 2020-06-19 | Discharge: 2020-06-19 | Disposition: A | Discharge status: Home / Self Care | Payer: 59 | Attending: Emergency Medicine | Admitting: Emergency Medicine

## 2020-06-19 ENCOUNTER — Emergency Department: Payer: 59

## 2020-06-19 DIAGNOSIS — K2900 Acute gastritis without bleeding: Secondary | ICD-10-CM | POA: Insufficient documentation

## 2020-06-19 DIAGNOSIS — Z96643 Presence of artificial hip joint, bilateral: Secondary | ICD-10-CM | POA: Insufficient documentation

## 2020-06-19 DIAGNOSIS — J449 Chronic obstructive pulmonary disease, unspecified: Secondary | ICD-10-CM | POA: Insufficient documentation

## 2020-06-19 DIAGNOSIS — J45909 Unspecified asthma, uncomplicated: Secondary | ICD-10-CM | POA: Diagnosis not present

## 2020-06-19 DIAGNOSIS — R1033 Periumbilical pain: Secondary | ICD-10-CM | POA: Diagnosis present

## 2020-06-19 LAB — COMPREHENSIVE METABOLIC PANEL
ALT: 19 U/L (ref 0–44)
AST: 20 U/L (ref 15–41)
Albumin: 4.1 g/dL (ref 3.5–5.0)
Alkaline Phosphatase: 68 U/L (ref 38–126)
Anion gap: 11 (ref 5–15)
BUN: 24 mg/dL — ABNORMAL HIGH (ref 6–20)
CO2: 21 mmol/L — ABNORMAL LOW (ref 22–32)
Calcium: 9.1 mg/dL (ref 8.9–10.3)
Chloride: 104 mmol/L (ref 98–111)
Creatinine, Ser: 0.69 mg/dL (ref 0.44–1.00)
GFR, Estimated: >60 mL/min (ref >60)
Glucose, Bld: 111 mg/dL — ABNORMAL HIGH (ref 70–99)
Potassium: 3.7 mmol/L (ref 3.5–5.1)
Sodium: 136 mmol/L (ref 135–145)
Total Bilirubin: 0.5 mg/dL (ref 0.3–1.2)
Total Protein: 7.3 g/dL (ref 6.5–8.1)

## 2020-06-19 LAB — URINALYSIS, COMPLETE (UACMP) WITH MICROSCOPIC
Bilirubin Urine: NEGATIVE
Glucose, UA: NEGATIVE mg/dL
Hgb urine dipstick: NEGATIVE
Ketones, ur: NEGATIVE mg/dL
Leukocytes,Ua: NEGATIVE
Nitrite: NEGATIVE
Protein, ur: NEGATIVE mg/dL
Specific Gravity, Urine: 1.024 (ref 1.005–1.030)
pH: 5 (ref 5.0–8.0)

## 2020-06-19 LAB — CBC
HCT: 40.4 % (ref 36.0–46.0)
Hemoglobin: 13.2 g/dL (ref 12.0–15.0)
MCH: 31.2 pg (ref 26.0–34.0)
MCHC: 32.7 g/dL (ref 30.0–36.0)
MCV: 95.5 fL (ref 80.0–100.0)
Platelets: 343 10*3/uL (ref 150–400)
RBC: 4.23 MIL/uL (ref 3.87–5.11)
RDW: 12.5 % (ref 11.5–15.5)
WBC: 7.8 10*3/uL (ref 4.0–10.5)
nRBC: 0 % (ref 0.0–0.2)

## 2020-06-19 LAB — LIPASE, BLOOD: Lipase: 39 U/L (ref 11–51)

## 2020-06-19 IMAGING — CT CT ABD-PELV W/ CM
2 of 5 series · 15 of 46 positions shown, 17 images · IV contrast (APPLIED)
Comparison: Lumbar CT [DATE]

CLINICAL DATA: Nonlocalized abdominal pain for 2 weeks

EXAM:
CT ABDOMEN AND PELVIS WITH CONTRAST
TECHNIQUE: Multidetector CT imaging of the abdomen and pelvis was performed
using the standard protocol following bolus administration of
intravenous contrast.
CONTRAST:  100mL OMNIPAQUE IOHEXOL 300 MG/ML  SOLN

[Series 2: axial (person_name) (person_name) · axial · 0.81mm/px · z∈[-835,-380]mm · 12 of 103 slices shown, 14 images]
[im 6/103  soft-tissue]
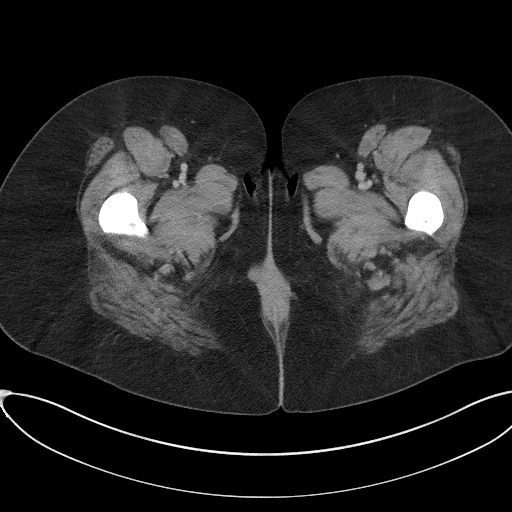
[im 6/103  bone]
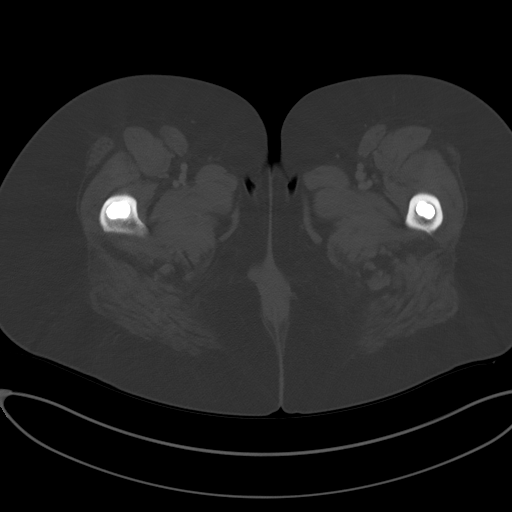
[im 18/103  soft-tissue]
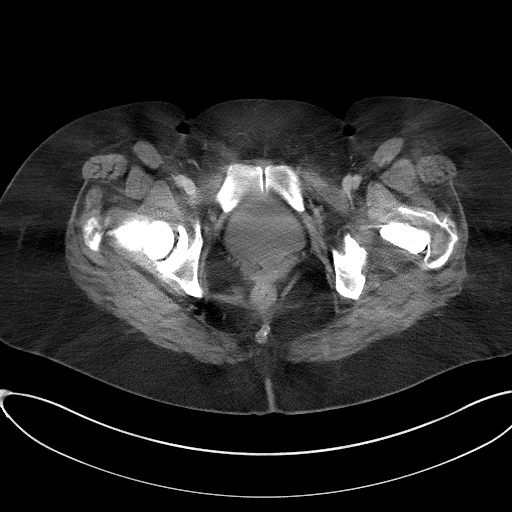
[im 23/103  soft-tissue]
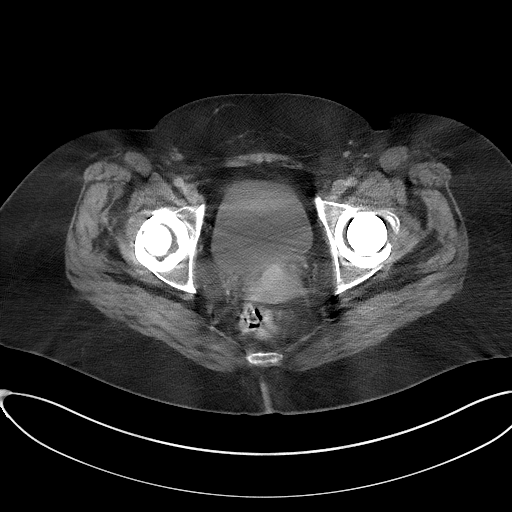
[im 29/103  soft-tissue]
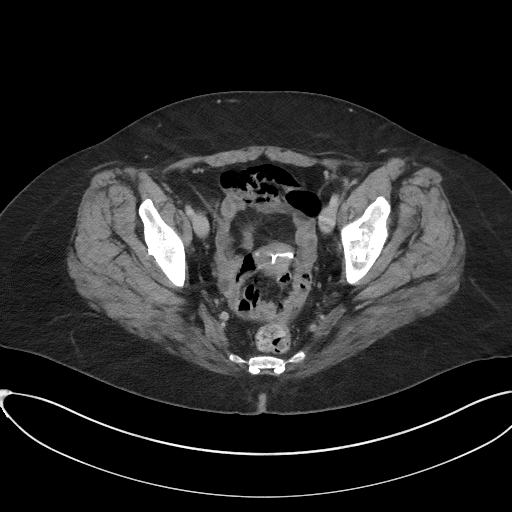
[im 40/103  soft-tissue]
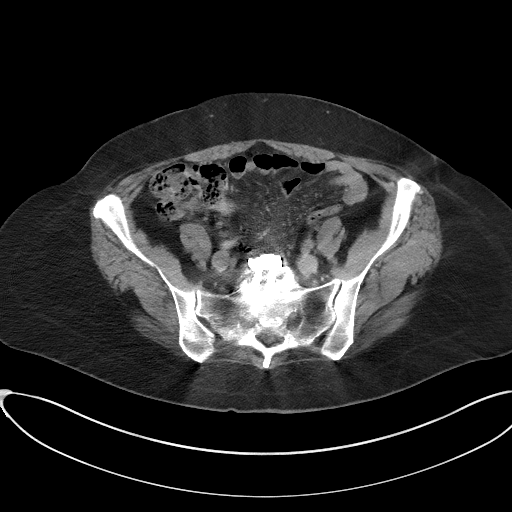
[im 46/103  soft-tissue]
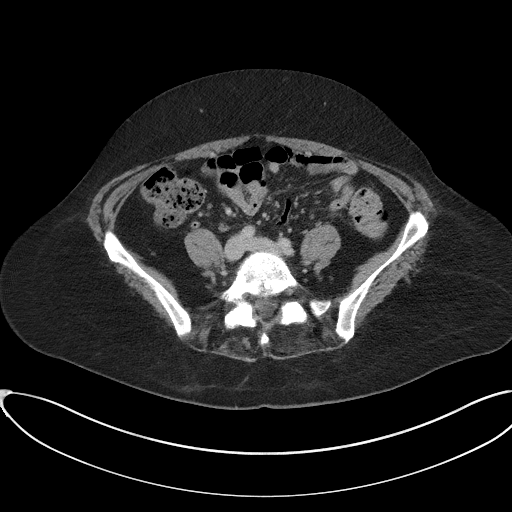
[im 57/103  soft-tissue]
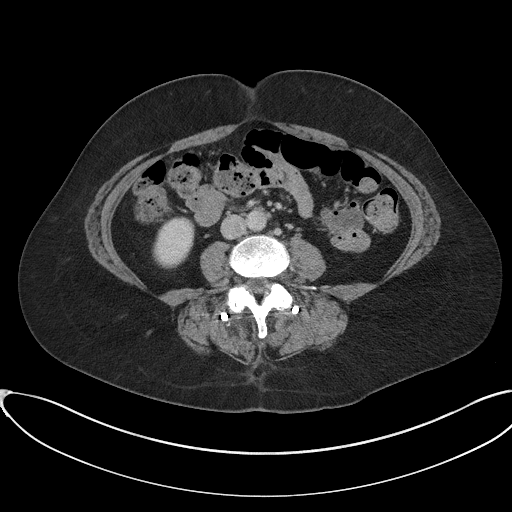
[im 63/103  soft-tissue]
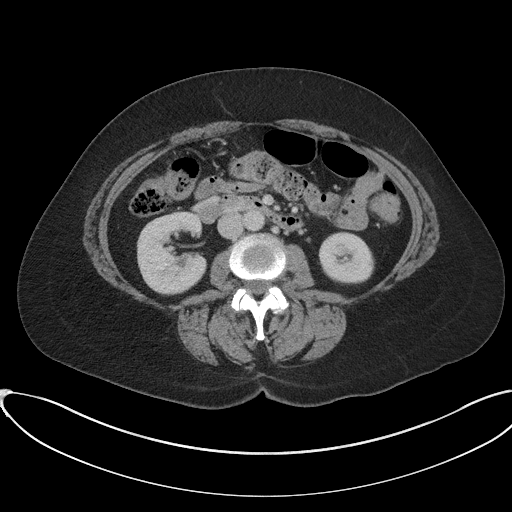
[im 74/103  soft-tissue]
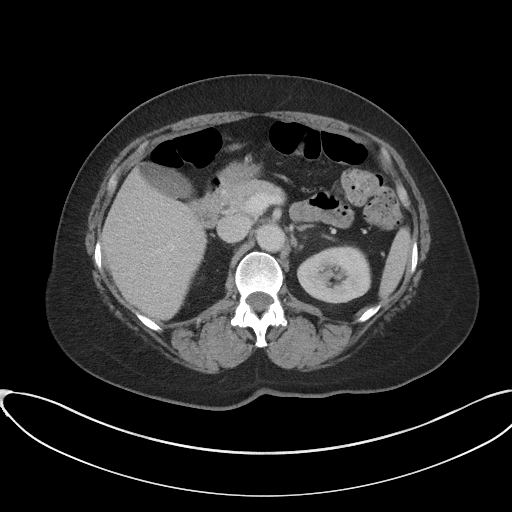
[im 74/103  bone]
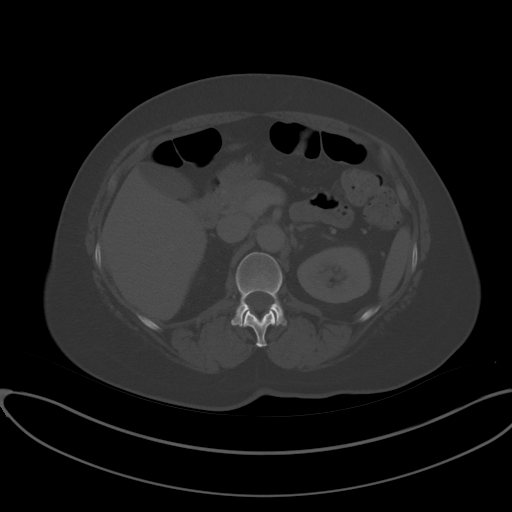
[im 80/103  soft-tissue]
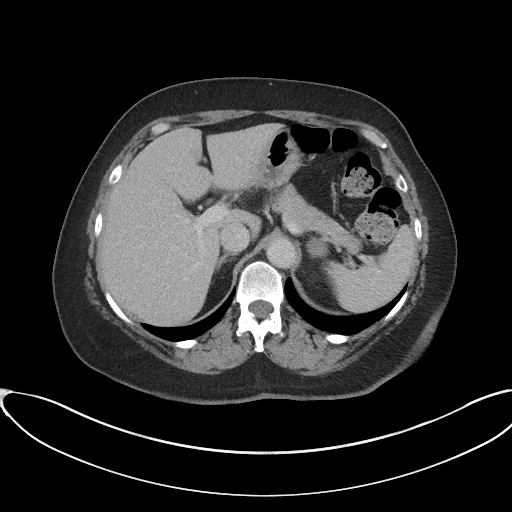
[im 86/103  soft-tissue]
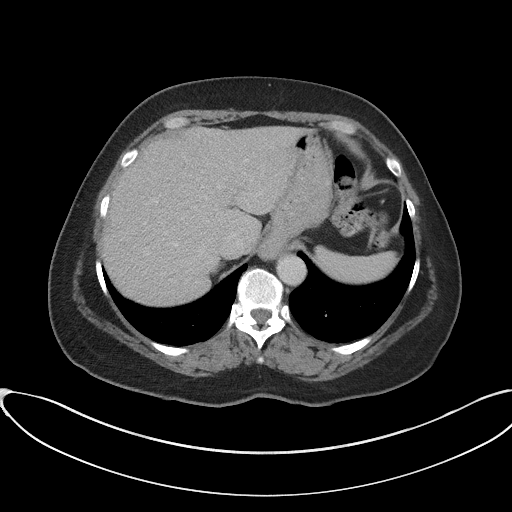
[im 97/103  soft-tissue]
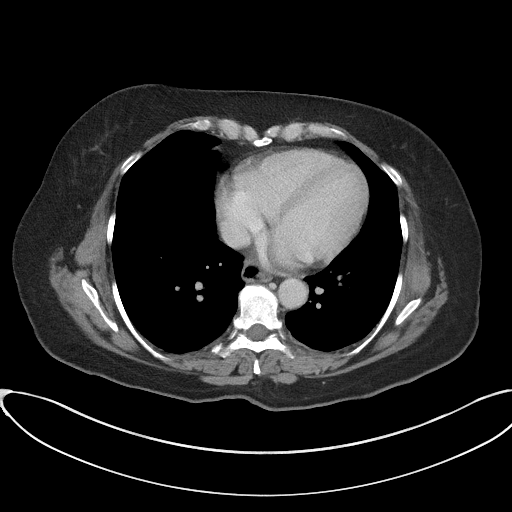

[Series 5: coronal st · coronal · 0.80mm/px · 3 of 99 slices shown]
[im 33/99  soft-tissue]
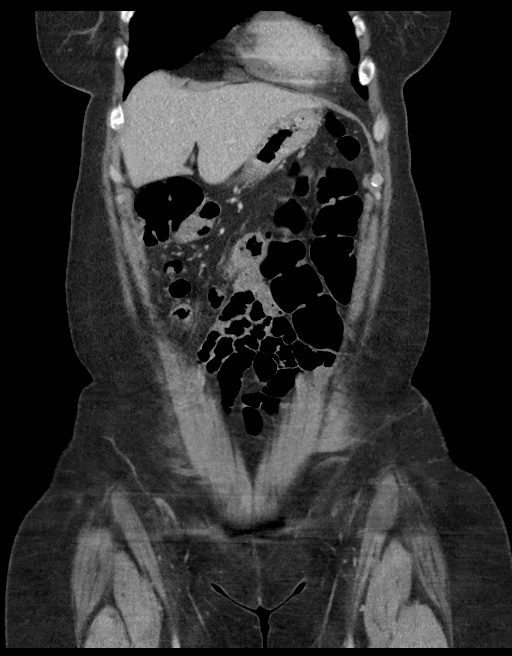
[im 44/99  soft-tissue]
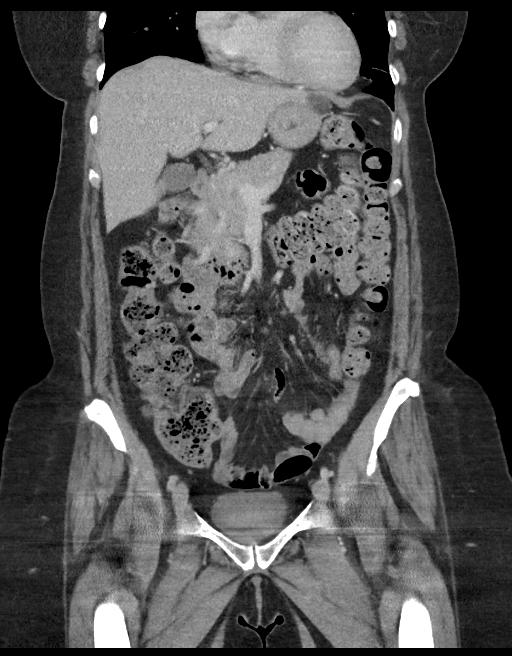
[im 55/99  soft-tissue]
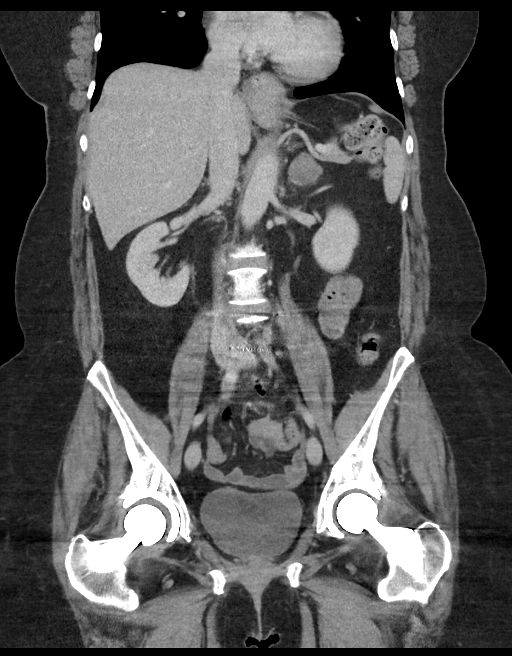

[15 of 46 positions shown; findings below may reference images not displayed]

FINDINGS: Lower chest: Hiatal hernia, detailed below. Lung bases are clear.
Normal heart size. No pericardial effusion.

Hepatobiliary: Fluid attenuation cyst seen in the left lobe liver
measuring up to 1.3 cm in size ([DATE]). No concerning focal liver
lesion. Smooth liver surface contour. Normal liver attenuation.
Normal gallbladder and biliary tree.

Pancreas: No pancreatic ductal dilatation or surrounding
inflammatory changes.

Spleen: Normal in size. No concerning splenic lesions.

Adrenals/Urinary Tract: 2.6 cm intermediate attenuation nodule in
the left adrenal gland ([DATE]). Concerning right adrenal lesions.
Kidneys are normally located with symmetric enhancement and
excretion. No suspicious renal lesion, urolithiasis or
hydronephrosis. Urinary bladder is largely decompressed at the time
of exam and therefore poorly evaluated by CT imaging.

Stomach/Bowel: Sliding-type hiatal hernia containing portion of the
proximal stomach. Distal stomach and duodenum are unremarkable. No
small bowel thickening or dilatation. A normal appendix is
visualized. No colonic dilatation or wall thickening. No evidence of
bowel obstruction.

Vascular/Lymphatic: No significant vascular findings are present. No
enlarged abdominal or pelvic lymph nodes.

Reproductive: Anteverted uterus. IUD appears positioned within the
endometrial canal though the lateral limbs of the intrauterine
device closely approximate the serosal surface of the uterus and may
demonstrate some intramuscular extension, could be better evaluated
with ultrasound. No concerning adnexal lesion.

Other: No abdominopelvic free fluid or free gas. No bowel containing
hernias.

Musculoskeletal: Bilateral total hip arthroplasties, intact and
aligned. Anterior interbody fusion L5-S1 with possible loosening of
the S1 screws. Posterior spinal fusion and interbody spacer
placement L4-5, without acute complication. No acute osseous
abnormality or suspicious osseous lesion.
IMPRESSION: 1. Sliding-type hiatal hernia containing portion of the proximal
stomach.
2. IUD appears positioned within the endometrial canal though the
lateral limbs of the intrauterine device closely approximate the
serosal surface of the uterus and may demonstrate some intramuscular
extension, could be better evaluated with ultrasound.
3. No other acute or suspicious abdominal findings.
4. Indeterminate 2.6 cm intermediate attenuation nodule in the left
adrenal gland. Could be better evaluated with nonemergent outpatient
adrenal protocol MRI or CT exam. This recommendation follows ACR
consensus guidelines: Management of Incidental Adrenal Masses: A
White Paper of the ACR Incidental Findings Committee. [HOSPITAL] [QN];14:[PHONE_NUMBER].

## 2020-06-19 MED ORDER — ACETAMINOPHEN 325 MG PO TABS
650.0000 mg | ORAL_TABLET | Freq: Once | ORAL | Status: AC
  Administered 2020-06-19: 17:00:00 650 mg via ORAL
  Filled 2020-06-19: qty 2

## 2020-06-19 MED ORDER — ONDANSETRON HCL 4 MG/2ML IJ SOLN
4.0000 mg | Freq: Once | INTRAMUSCULAR | Status: AC
  Administered 2020-06-19: 17:00:00 4 mg via INTRAVENOUS
  Filled 2020-06-19: qty 2

## 2020-06-19 MED ORDER — IOHEXOL 300 MG/ML  SOLN
100.0000 mL | Freq: Once | INTRAMUSCULAR | Status: AC | PRN
  Administered 2020-06-19: 19:00:00 100 mL via INTRAVENOUS
  Filled 2020-06-19: qty 100

## 2020-06-19 MED ORDER — LIDOCAINE VISCOUS HCL 2 % MT SOLN: 10.0000 mL | OROMUCOSAL | 0 refills | Status: DC | PRN

## 2020-06-19 MED ORDER — ALUM & MAG HYDROXIDE-SIMETH 200-200-20 MG/5ML PO SUSP
30.0000 mL | Freq: Once | ORAL | Status: AC
  Administered 2020-06-19: 20:00:00 30 mL via ORAL
  Filled 2020-06-19: qty 30

## 2020-06-19 MED ORDER — SODIUM CHLORIDE 0.9 % IV BOLUS
1000.0000 mL | Freq: Once | INTRAVENOUS | Status: AC
Start: 2020-06-19 — End: 2020-06-19
  Administered 2020-06-19: 17:00:00 1000 mL via INTRAVENOUS

## 2020-06-19 MED ORDER — LIDOCAINE VISCOUS HCL 2 % MT SOLN
15.0000 mL | Freq: Once | OROMUCOSAL | Status: AC
  Administered 2020-06-19: 20:00:00 15 mL via ORAL
  Filled 2020-06-19: qty 15

## 2020-06-19 MED ORDER — FAMOTIDINE 20 MG PO TABS: 20.0000 mg | ORAL_TABLET | Freq: Every day | ORAL | 0 refills | Status: DC

## 2020-06-19 NOTE — ED Notes (Signed)
NAD noted at time of D/C. Pt denies questions or concerns. Pt ambulatory to the lobby at this time. Verbal Consent for discharge obtained at this time.

## 2020-06-19 NOTE — ED Triage Notes (Signed)
Pt to ED via POV c/o abdominal pain x 2 weeks that has been getting worse, pt states that yesterday she started vomiting and was not able to keep anything down until last night. Pt states that she was vomiting stomach acid yesterday. Pt is in NAD.

## 2020-06-19 NOTE — ED Notes (Signed)
See this RN's triage note. Pt with c/o abdominal pain x 2 week.s A&O x4, ambulatory to room without difficulty.

## 2020-06-19 NOTE — ED Provider Notes (Signed)
[facility] Emergency Department Provider Note  ____________________________________________  Time seen: Approximately 4:44 PM  I have reviewed the triage vital signs and the nursing notes.   HISTORY  Chief Complaint Abdominal Pain    HPI Maureen Randall is a 54 y.o. female who presents the emergency department complaining of periumbilical pain.  She describes it as a burning sensation and mostly located in the superior aspect around the umbilicus.  This is been ongoing times several weeks.  She does have a history of GERD but states that the symptoms are different than her typical gastroesophageal reflux disease.  Patient has had some nausea but no emesis.  No diarrhea or constipation.  No fevers or chills.  Patient states that she takes omeprazole for her GERD symptoms.  History of asthma, GERD, COPD, DVT, peripheral vascular disease.         Past Medical History:  Diagnosis Date  . Anxiety   . Arthritis    osteoarthritis-joints, feet, shoulder  . Asthma    previously diagnosed after a bout of bronchitis/ no problems since  . Chronic pain syndrome   . COPD (chronic obstructive pulmonary disease) ([place]) 2004   patient states probably bronchitis  . Depression   . DVT (deep venous thrombosis) ([place]) 2012   right leg. after surgery, while on BCP  . GERD (gastroesophageal reflux disease)   . Hyperlipidemia   . Neuropathy    post surgery  . PVD (peripheral vascular disease) ([place])   . Spondylolisthesis     Patient Active Problem List   Diagnosis Date Noted  . Special screening for malignant neoplasms, colon   . Chronic bilateral low back pain 12/23/2019  . Varicose veins of both lower extremities 12/23/2019  . H/O nasal septoplasty 09/10/2019  . Biceps tendinitis 09/10/2019  . Preoperative clearance 09/10/2019  . 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 08/17/2019  . Encounter for orthopedic follow-up care 08/03/2019  . Muscle  spasm 07/09/2019  . Primary osteoarthritis involving multiple joints 07/07/2019  . Disorder of left rotator cuff 04/02/2019  . Asthma 04/02/2019  . Prediabetes 04/01/2019  . Pain of left shoulder joint on movement 02/26/2019  . Generalized anxiety disorder 02/18/2019  . Insomnia due to medical condition 02/18/2019  . Gastroesophageal reflux disease without esophagitis 12/25/2018  . Recurrent UTI 12/25/2018  . Obesity (BMI 30-39.9) 12/25/2018  . Chronic deep vein thrombosis (DVT) of calf muscle vein of right lower extremity ([place]) 10/23/2018  . Venous insufficiency of both lower extremities 10/20/2018  . IUD threads lost 04/30/2018  . 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 History of lumbar fusion 01/29/2018  . Primary osteoarthritis of one hip, right 12/20/2017  . Closed fracture of metatarsal bone 08/06/2017  . Foot pain 08/06/2017  . Sprain of ankle 08/06/2017  . Strain of foot 01/17/2017  . Long term current use of opiate analgesic 08/31/2015  . Chronic pain syndrome 08/31/2015  . Bilateral carpal tunnel syndrome 08/31/2015  . Arthralgia of multiple joints 08/31/2015  . Increased band cell count 08/31/2015  . Bilateral lumbar radiculopathy 07/27/2014  . Degenerative spondylolisthesis 07/27/2014  . Facet arthropathy, lumbosacral 07/27/2014  . 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 Anxiety state 10/21/2009    Past Surgical History:  Procedure Laterality Date  . ABLATION     veins in lower legs  . BACK SURGERY  07/2014   lumbar spine/ fusion 1 level  . BREAST BIOPSY Left 2011  neg  . CARPAL TUNNEL RELEASE Right 12/2015   [facility]  . CARPAL TUNNEL RELEASE Left 12/2016  . COLONOSCOPY WITH PROPOFOL N/A 02/29/2020   Procedure: COLONOSCOPY WITH PROPOFOL;  Surgeon: [name], MD;  Location: [place];  Service: Endoscopy;  Laterality: N/A;  . ESOPHAGOGASTRODUODENOSCOPY (EGD) WITH PROPOFOL N/A 02/29/2020   Procedure:  ESOPHAGOGASTRODUODENOSCOPY (EGD) WITH PROPOFOL;  Surgeon: [name], MD;  Location: [place];  Service: Endoscopy;  Laterality: N/A;  . FOOT SURGERY Right 2015   broken navicular bone  . HIP ARTHROSCOPY Right 05/2011  . HIP SURGERY     left hip surgery 05/2018  . JOINT REPLACEMENT Bilateral 12/2017 & 05/2018   hip  . MASS EXCISION Left 12/17/2019   Procedure: EXCISION TUMOR FOOT DEEP LEFT;  Surgeon: Albertine Patricia, DPM;  Location: [place];  Service: Podiatry;  Laterality: Left;  LMA W/LOCAL  . NASAL TURBINATE REDUCTION Bilateral 06/12/2019   Procedure: TURBINATE REDUCTION/SUBMUCOSAL RESECTION;  Surgeon: [name], MD;  Location: [place];  Service: ENT;  Laterality: Bilateral;  . 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 ROTATOR CUFF REPAIR     Dr. [name] in 04/2019   . SCLEROTHERAPY     veins in lower legs  . SEPTOPLASTY Bilateral 06/12/2019   Procedure: SEPTOPLASTY;  Surgeon: [name], MD;  Location: [place];  Service: ENT;  Laterality: Bilateral;  . SHOULDER SURGERY Right 12/2015   [facility] torn rotator cuff  . SPINAL FUSION  2016   [facility] lumbar    Prior to Admission medications   Medication Sig Start Date End Date Taking? Authorizing Provider  baclofen (LIORESAL) 10 MG tablet Take 10 mg by mouth 3 (three) times daily as needed for muscle spasms.    [provider]  celecoxib (CELEBREX) 100 MG capsule Take 1 capsule (100 mg total) by mouth daily. Patient not taking: Reported on 02/22/2020 08/14/19   McLean-Scocuzza, Nino Glow, MD  diclofenac Sodium (VOLTAREN) 1 % GEL Apply 2-4 g topically 4 (four) times daily. 2 gram upper body qid prn and 4 gram lower body qid prn 07/15/19   McLean-Scocuzza, Nino Glow, MD  doxycycline (VIBRAMYCIN) 100 MG capsule Take 100 mg by mouth 2 (two) times daily. 05/04/20   [provider]  DULoxetine (CYMBALTA) 60 MG capsule TAKE 1  CAPSULE BY MOUTH EVERY DAY 06/08/20   Ursula Alert, MD  Elastic Bandages & Supports (RELIEF KNEE) MISC Apply in the morning and remove at night. 07/15/13   [provider]  estradiol (ESTRACE) 0.1 MG/GM vaginal cream INSERT PEA SIZED AMOUNT (0.5G) INTO VAGINA NIGHTLY EVERY OTHER NIGHT FOR 2 WEEKS, THEN TWICE WEEKLY 02/05/17   [provider]  famotidine (PEPCID) 20 MG tablet Take 1 tablet (20 mg total) by mouth daily for 28 days. 06/19/20 07/17/20  Lillyauna Jenkinson, Charline Bills, PA-C  fexofenadine (ALLEGRA) 180 MG tablet Take 180 mg by mouth daily. Patient not taking: Reported on 02/22/2020    [provider]  fluticasone (FLONASE) 50 MCG/ACT nasal spray Place 1-2 sprays into both nostrils daily as needed. Max dose 2 sprays each nostril 03/14/20   McLean-Scocuzza, Nino Glow, MD  HORIZANT 600 MG TBCR Take 1 tablet by mouth 2 (two) times daily. 02/24/20   [provider]  ibuprofen (ADVIL,MOTRIN) 800 MG tablet TK 1 T PO Q 8 H WF PRN P 05/06/18   [provider]  lidocaine (XYLOCAINE) 2 % solution Use as directed  10 mLs in the mouth or throat every 4 (four) hours as needed (abodminal pain). 06/19/20   Kamrynn Melott, Charline Bills, PA-C  linaclotide (LINZESS) 145 MCG CAPS capsule Take 1 capsule (145 mcg total) by mouth daily before breakfast. 03/16/20   [name], MD  lubiprostone (AMITIZA) 24 MCG capsule Take 1 capsule (24 mcg total) by mouth 2 (two) times daily with a meal. 02/29/20   [name], MD  Melatonin 3 MG CAPS Take by mouth.    [provider]  meloxicam (MOBIC) 15 MG tablet Take 15 mg by mouth daily. 03/16/20   [provider]  methocarbamol (ROBAXIN) 500 MG tablet Take 2 tablets (1,000 mg total) by mouth 2 (two) times daily as needed for muscle spasms. Patient taking differently: Take 1,000 mg by mouth every 6 (six) hours as needed for muscle spasms.  07/07/19   McLean-Scocuzza, Nino Glow, MD  Multiple Vitamin (MULTIVITAMIN) tablet Take 1 tablet by mouth  daily.     [provider]  nitrofurantoin (MACRODANTIN) 50 MG capsule Take 1 capsule (50 mg total) by mouth as needed. UTI dose should be 100 mg bid x 5 days UTI  or 50-100 mg qhs prevention 04/01/19   McLean-Scocuzza, Nino Glow, MD  nitrofurantoin (MACRODANTIN) 50 MG capsule Take by mouth.    [provider]  omeprazole (PRILOSEC) 40 MG capsule Take 1 capsule (40 mg total) by mouth daily. 30 min before food Patient not taking: Reported on 02/22/2020 10/11/19   McLean-Scocuzza, Nino Glow, MD  oxyCODONE-acetaminophen (PERCOCET) 10-325 MG tablet Take 1 tablet by mouth every 6 (six) hours as needed. 06/16/19   [provider]  pantoprazole (PROTONIX) 40 MG tablet Take 1 tablet (40 mg total) by mouth daily. 03/29/20   [name], MD  phentermine (ADIPEX-P) 37.5 MG tablet Take 1 tablet (37.5 mg total) by mouth daily before breakfast. 12/22/19   McLean-Scocuzza, Nino Glow, MD  Sodium Sulfate-Mag Sulfate-KCl (SUTAB) [phone] MG TABS Take 376 mg by mouth as directed. 02/22/20   [name], MD  traZODone (DESYREL) 100 MG tablet Take 2 tablets (200 mg total) by mouth at bedtime as needed for sleep. 06/10/20   Ursula Alert, MD  valACYclovir (VALTREX) 1000 MG tablet as needed. Oral blister 07/04/18   [provider]    Allergies Amoxicillin, Septra [sulfamethoxazole-trimethoprim], Zanaflex [tizanidine], and Cephalexin  Family History  Problem Relation Age of Onset  . Breast cancer Maternal Aunt   . Breast cancer Paternal Aunt   . Dementia Paternal Aunt   . COPD Mother        quit smoking after 60 years  . Arthritis Mother   . Osteoarthritis Mother   . Dementia Father        symptoms started in his 41s  . Alzheimer's disease Father   . Rheum arthritis Father   . Rheum arthritis Other        FH  . Dementia Other        "all my dad's side of the family"  . Other Maternal Grandmother        got dementia as she got older   . Dementia Paternal Grandmother      Social History Social History   Tobacco Use  . Smoking status: Never Smoker  . Smokeless tobacco: Never Used  Vaping Use  . Vaping Use: Never used  Substance Use Topics  . Alcohol use: Yes    Alcohol/week: 1.0 standard drink    Types: 1 Cans of beer per week  Comment: may have drink 1x/month  . Drug use: Never     Review of Systems  Constitutional: No fever/chills Eyes: No visual changes. No discharge ENT: No upper respiratory complaints. Cardiovascular: no chest pain. Respiratory: no cough. No SOB. Gastrointestinal: Periumbilical abdominal pain.  Positive nausea, no vomiting.  No diarrhea.  No constipation. Genitourinary: Negative for dysuria. No hematuria Musculoskeletal: Negative for musculoskeletal pain. Skin: Negative for rash, abrasions, lacerations, ecchymosis. Neurological: Negative for headaches, focal weakness or numbness.  10 System ROS otherwise negative.  ____________________________________________   PHYSICAL EXAM:  VITAL SIGNS: ED Triage Vitals  Enc Vitals Group     BP 06/19/20 1136 (!) 134/95     Pulse Rate 06/19/20 1136 83     Resp 06/19/20 1136 16     Temp 06/19/20 1136 98.2 F (36.8 C)     Temp Source 06/19/20 1136 Oral     SpO2 06/19/20 1136 97 %     Weight 06/19/20 1133 212 lb (96.2 kg)     Height 06/19/20 1133 5\' 7"  (1.702 m)     Head Circumference --      Peak Flow --      Pain Score 06/19/20 1133 4     Pain Loc --      Pain Edu? --      Excl. in [place]? --      Constitutional: Alert and oriented. Well appearing and in no acute distress. Eyes: Conjunctivae are normal. PERRL. EOMI. Head: Atraumatic. ENT:      Ears:       Nose: No congestion/rhinnorhea.      Mouth/Throat: Mucous membranes are moist.  Neck: No stridor.   Hematological/Lymphatic/Immunilogical: No cervical lymphadenopathy. Cardiovascular: Normal rate, regular rhythm. Normal S1 and S2.  Good peripheral circulation. Respiratory: Normal respiratory effort without  tachypnea or retractions. Lungs CTAB. Good air entry to the bases with no decreased or absent breath sounds. Gastrointestinal: Bowel sounds 4 quadrants.  Soft to palpation.  Patient is mildly tender to palpation in the superior aspect of the umbilicus.  No other acute tenderness to palpation.  No palpable abnormality.. No guarding or rigidity. No palpable masses. No distention. No CVA tenderness. Musculoskeletal: Full range of motion to all extremities. No gross deformities appreciated. Neurologic:  Normal speech and language. No gross focal neurologic deficits are appreciated.  Skin:  Skin is warm, dry and intact. No rash noted. Psychiatric: Mood and affect are normal. Speech and behavior are normal. Patient exhibits appropriate insight and judgement.   ____________________________________________   LABS (all labs ordered are listed, but only abnormal results are displayed)  Labs Reviewed  COMPREHENSIVE METABOLIC PANEL - Abnormal; Notable for the following components:      Result Value   CO2 21 (*)    Glucose, Bld 111 (*)    BUN 24 (*)    All other components within normal limits  URINALYSIS, COMPLETE (UACMP) WITH MICROSCOPIC - Abnormal; Notable for the following components:   Color, Urine YELLOW (*)    APPearance CLOUDY (*)    Bacteria, UA RARE (*)    All other components within normal limits  LIPASE, BLOOD  CBC   ____________________________________________  EKG   ____________________________________________  RADIOLOGY I personally viewed and evaluated these images as part of my medical decision making, as well as reviewing the written report by the radiologist.  ED Provider Interpretation: I have a hernia, possible IUD fixation along the uterus wall, nonspecific nodule on the left adrenal gland.  Otherwise no acute findings.  CT ABDOMEN PELVIS W CONTRAST  Result Date: 06/19/2020 CLINICAL DATA:  Nonlocalized abdominal pain for 2 weeks EXAM: CT ABDOMEN AND PELVIS WITH  CONTRAST TECHNIQUE: Multidetector CT imaging of the abdomen and pelvis was performed using the standard protocol following bolus administration of intravenous contrast. CONTRAST:  144mL OMNIPAQUE IOHEXOL 300 MG/ML  SOLN COMPARISON:  Lumbar CT 04/08/2020 FINDINGS: Lower chest: Hiatal hernia, detailed below. Lung bases are clear. Normal heart size. No pericardial effusion. Hepatobiliary: Fluid attenuation cyst seen in the left lobe liver measuring up to 1.3 cm in size (2/15). No concerning focal liver lesion. Smooth liver surface contour. Normal liver attenuation. Normal gallbladder and biliary tree. Pancreas: No pancreatic ductal dilatation or surrounding inflammatory changes. Spleen: Normal in size. No concerning splenic lesions. Adrenals/Urinary Tract: 2.6 cm intermediate attenuation nodule in the left adrenal gland (2/27). Concerning right adrenal lesions. Kidneys are normally located with symmetric enhancement and excretion. No suspicious renal lesion, urolithiasis or hydronephrosis. Urinary bladder is largely decompressed at the time of exam and therefore poorly evaluated by CT imaging. Stomach/Bowel: Sliding-type hiatal hernia containing portion of the proximal stomach. Distal stomach and duodenum are unremarkable. No small bowel thickening or dilatation. A normal appendix is visualized. No colonic dilatation or wall thickening. No evidence of bowel obstruction. Vascular/Lymphatic: No significant vascular findings are present. No enlarged abdominal or pelvic lymph nodes. Reproductive: Anteverted uterus. IUD appears positioned within the endometrial canal though the lateral limbs of the intrauterine device closely approximate the serosal surface of the uterus and may demonstrate some intramuscular extension, could be better evaluated with ultrasound. No concerning adnexal lesion. Other: No abdominopelvic free fluid or free gas. No bowel containing hernias. Musculoskeletal: Bilateral total hip arthroplasties,  intact and aligned. Anterior interbody fusion L5-S1 with possible loosening of the S1 screws. Posterior spinal fusion and interbody spacer placement L4-5, without acute complication. No acute osseous abnormality or suspicious osseous lesion. IMPRESSION: 1. Sliding-type hiatal hernia containing portion of the proximal stomach. 2. IUD appears positioned within the endometrial canal though the lateral limbs of the intrauterine device closely approximate the serosal surface of the uterus and may demonstrate some intramuscular extension, could be better evaluated with ultrasound. 3. No other acute or suspicious abdominal findings. 4. Indeterminate 2.6 cm intermediate attenuation nodule in the left adrenal gland. Could be better evaluated with nonemergent outpatient adrenal protocol MRI or CT exam. This recommendation follows ACR consensus guidelines: Management of Incidental Adrenal Masses: A White Paper of the ACR Incidental Findings Committee. J Am Coll Radiol 2017;14:1038-1044. Electronically Signed   By: Lovena Le M.D.   On: 06/19/2020 19:18    ____________________________________________    PROCEDURES  Procedure(s) performed:    Procedures    Medications  alum & mag hydroxide-simeth (MAALOX/MYLANTA) 200-200-20 MG/5ML suspension 30 mL (has no administration in time range)    And  lidocaine (XYLOCAINE) 2 % viscous mouth solution 15 mL (has no administration in time range)  sodium chloride 0.9 % bolus 1,000 mL (1,000 mLs Intravenous New Bag/Given 06/19/20 1713)  ondansetron (ZOFRAN) injection 4 mg (4 mg Intravenous Given 06/19/20 1709)  acetaminophen (TYLENOL) tablet 650 mg (650 mg Oral Given 06/19/20 1706)  iohexol (OMNIPAQUE) 300 MG/ML solution 100 mL (100 mLs Intravenous Contrast Given 06/19/20 1832)     ____________________________________________   INITIAL IMPRESSION / ASSESSMENT AND PLAN / ED COURSE  Pertinent labs & imaging results that were available during my care of the  patient were reviewed by me and considered in my medical decision making (see chart for  details).  Review of the [place] CSRS was performed in accordance of the [place] prior to dispensing any controlled drugs.           Patient's diagnosis is consistent with gastritis.  Patient presented to emergency department with burning sensation in the superior umbilical region.  There is no other tenderness to palpation.  Patient was slightly nauseated but no emesis, diarrhea or constipation.  No urinary symptoms.  At this time labs are stable.  Imaging reveals multiple incidental findings not attributed to patient's complaint.  She does have a sliding hiatal hernia, possible IUD fixation on the endometrial wall and indeterminate adrenal gland nodule.  Patient will follow up with GI for her hiatal hernia, OB/GYN for her IUD and primary care for adrenal gland nodule.  Patient is given GI cocktail here in the emergency department.. Patient will be discharged home with prescriptions for famotidine, Maalox, viscous lidocaine. Patient is to follow up with GI, OB/GYN and primary care as described above.   Patient is given ED precautions to return to the ED for any worsening or new symptoms.     ____________________________________________  FINAL CLINICAL IMPRESSION(S) / ED DIAGNOSES  Final diagnoses:  Acute gastritis without hemorrhage, unspecified gastritis type      NEW MEDICATIONS STARTED DURING THIS VISIT:  ED Discharge Orders         Ordered    famotidine (PEPCID) 20 MG tablet  Daily        06/19/20 2003    lidocaine (XYLOCAINE) 2 % solution  Every 4 hours PRN        06/19/20 2003              This chart was dictated using voice recognition software/Dragon. Despite best efforts to proofread, errors can occur which can change the meaning. Any change was purely unintentional.    Darletta Moll, PA-C 06/19/20 2004    Lucrezia Starch, MD 06/19/20 2227

## 2020-06-20 ENCOUNTER — Telehealth: Payer: Self-pay | Admitting: Gastroenterology

## 2020-06-20 ENCOUNTER — Other Ambulatory Visit: Payer: Self-pay

## 2020-06-20 ENCOUNTER — Telehealth: Payer: Self-pay | Admitting: Internal Medicine

## 2020-06-20 NOTE — Telephone Encounter (Signed)
Please review and ok for me to send.

## 2020-06-20 NOTE — Telephone Encounter (Signed)
Yes you can give her Zofran she is requesting prior to her appointment with me.

## 2020-06-20 NOTE — Telephone Encounter (Signed)
Please advise   Ct abdomen done in the hospital yesterday. Results in epic imaging

## 2020-06-20 NOTE — Telephone Encounter (Signed)
Patient called in stated that she needs  MRI  She went to the ED the weekend and had a CT scan because of abdominal pain they found something else not related to the abdominal pain

## 2020-06-20 NOTE — Telephone Encounter (Signed)
Ok please sch appt to disc results and I will order MRI then can be telephone or virtual   Thanks

## 2020-06-20 NOTE — Telephone Encounter (Signed)
Patient left voicemail stating she was seen at the ED yesterday 12.12.21 and was dx with acute gastritis. Pt wanting to know if Dr. Allen Norris can prescribe her medication zofran at least until appt with him on 1.27.21.

## 2020-06-21 ENCOUNTER — Other Ambulatory Visit: Payer: Self-pay

## 2020-06-21 MED ORDER — ONDANSETRON HCL 4 MG PO TABS: 4.0000 mg | ORAL_TABLET | Freq: Four times a day (QID) | ORAL | 1 refills | Status: DC | PRN

## 2020-06-21 NOTE — Telephone Encounter (Signed)
Patient scheduled for 12/22 at 10:00 am for a telephone call

## 2020-06-21 NOTE — Telephone Encounter (Signed)
Pt advised Zofran has been sent to her pharmacy.

## 2020-06-28 ENCOUNTER — Telehealth: Payer: Self-pay | Admitting: Gastroenterology

## 2020-06-28 ENCOUNTER — Other Ambulatory Visit: Payer: Self-pay | Admitting: Obstetrics and Gynecology

## 2020-06-28 DIAGNOSIS — Z1231 Encounter for screening mammogram for malignant neoplasm of breast: Secondary | ICD-10-CM

## 2020-06-28 LAB — HM PAP SMEAR: HM Pap smear: NORMAL

## 2020-06-28 NOTE — Telephone Encounter (Signed)
Let the patient know that her upper Endoscopy showed a normal stomach and no gastritis. Gastritis is not typically cause by a bacteria and antibiotics are not used to treat it . if she had gastritis which again was not seen on the most resent endoscopy then is it usually caused by irritants like NSAIDs and other Medication and not bacteria.

## 2020-06-28 NOTE — Telephone Encounter (Signed)
Patient states her symptoms from gastritis are still not improving and wants to know if Dr. Allen Norris could call in an antibiotic for her.

## 2020-06-29 ENCOUNTER — Other Ambulatory Visit: Payer: Self-pay

## 2020-06-29 ENCOUNTER — Encounter: Payer: Self-pay | Admitting: Internal Medicine

## 2020-06-29 ENCOUNTER — Ambulatory Visit
Admission: RE | Admit: 2020-06-29 | Discharge: 2020-06-29 | Disposition: A | Discharge status: Home / Self Care | Payer: 59 | Source: Ambulatory Visit | Attending: Internal Medicine | Admitting: Internal Medicine

## 2020-06-29 ENCOUNTER — Telehealth (INDEPENDENT_AMBULATORY_CARE_PROVIDER_SITE_OTHER): Payer: 59 | Admitting: Internal Medicine

## 2020-06-29 VITALS — Ht 67.0 in | Wt 203.0 lb

## 2020-06-29 DIAGNOSIS — K449 Diaphragmatic hernia without obstruction or gangrene: Secondary | ICD-10-CM | POA: Diagnosis not present

## 2020-06-29 DIAGNOSIS — E278 Other specified disorders of adrenal gland: Secondary | ICD-10-CM | POA: Insufficient documentation

## 2020-06-29 DIAGNOSIS — R112 Nausea with vomiting, unspecified: Secondary | ICD-10-CM | POA: Diagnosis not present

## 2020-06-29 DIAGNOSIS — K219 Gastro-esophageal reflux disease without esophagitis: Secondary | ICD-10-CM

## 2020-06-29 IMAGING — MR MR ABDOMEN WO/W CM
17 series · 48 of 48 positions shown · IV contrast (gadavist)
Comparison: CT on [DATE]

CLINICAL DATA: Indeterminate left adrenal mass on recent CT.

EXAM:
MRI ABDOMEN WITHOUT AND WITH CONTRAST
TECHNIQUE: Multiplanar multisequence MR imaging of the abdomen was performed
both before and after the administration of intravenous contrast.
CONTRAST:  9mL GADAVIST GADOBUTROL 1 MMOL/ML IV SOLN

[Series 2: cor haste · coronal · 6.0mm · 1.19mm/px · 1 of 30 slices shown]
[im 1/30]
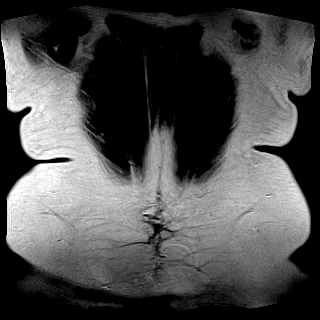

[Series 5: ax haste · axial · 6.0mm · 1.19mm/px · 1 of 32 slices shown]
[im 1/32]
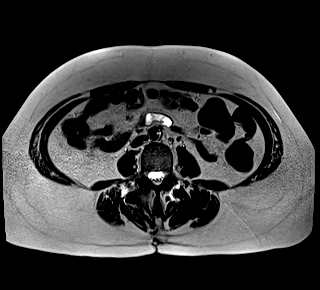

[Series 7: T2 fat-sat · axial · 6.0mm · 1.19mm/px · 1 of 32 slices shown]
[im 1/32]
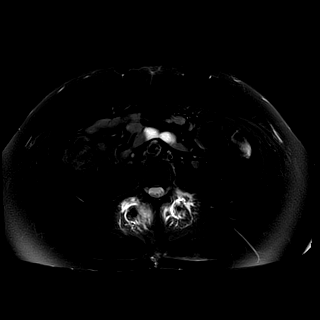

[Series 9: DWI · axial · 6.0mm · 1.42mm/px · z∈[-87,+136]mm · 3 of 96 slices shown (1 of 2)]
[im 1/96]
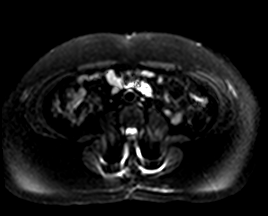
[im 48/96]
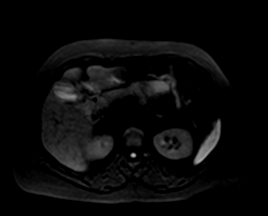
[im 96/96]
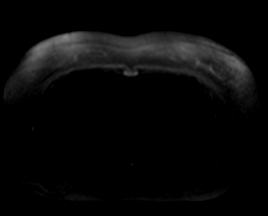

[Series 10: DWI · axial · 6.0mm · 1.42mm/px · 1 of 32 slices shown (2 of 2)]
[im 1/32]
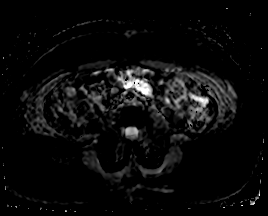

[Series 11: axial in-opp · axial · 3.0mm · 1.19mm/px · z∈[-94,+143]mm · 6 of 160 slices shown]
[im 1/160]
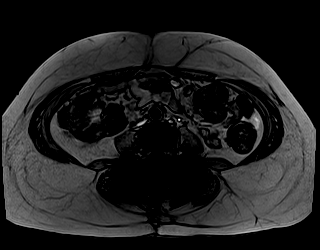
[im 32/160]
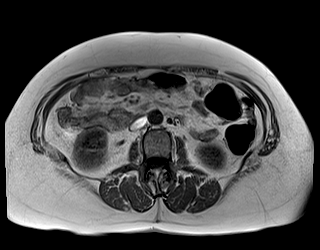
[im 64/160]
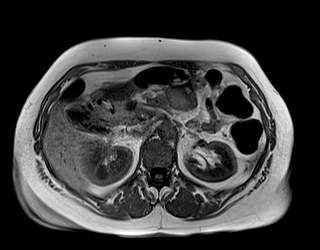
[im 96/160]
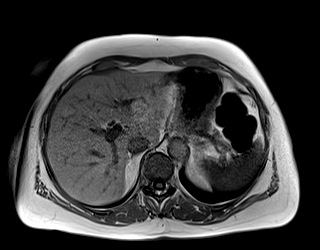
[im 128/160]
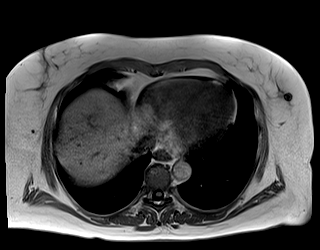
[im 160/160]
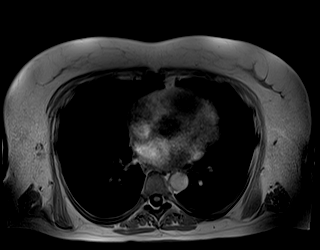

[Series 12: cor in-opp · coronal · 3.0mm · 1.19mm/px · 5 of 144 slices shown]
[im 1/144]
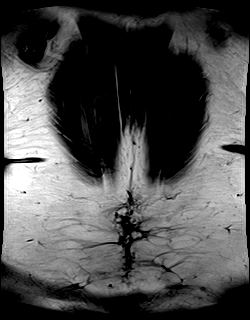
[im 36/144]
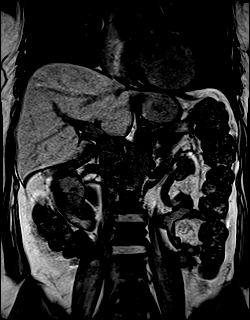
[im 72/144]
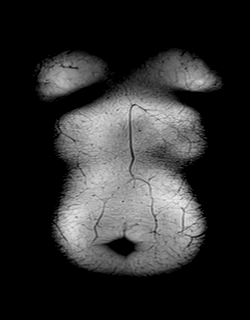
[im 108/144]
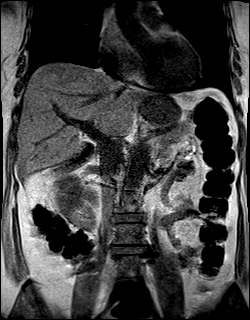
[im 144/144]
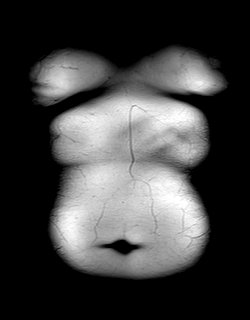

[Series 13: T1 dynamic fat-sat · axial · non-contrast · 3.0mm · 1.19mm/px · z∈[-94,+143]mm · 3 of 80 slices shown (1 of 5)]
[im 1/80]
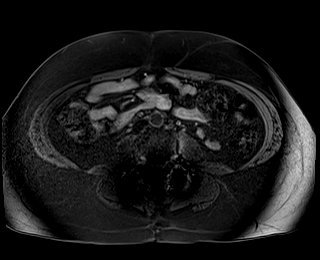
[im 40/80]
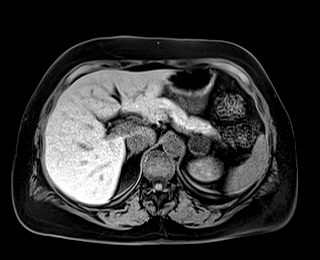
[im 80/80]
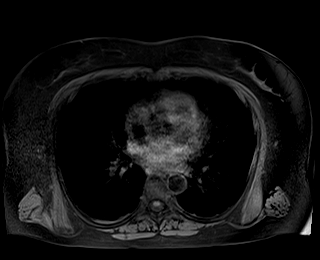

[Series 14: T1 dynamic fat-sat post-contrast · axial · 3.0mm · 1.19mm/px · z∈[-94,+143]mm · 3 of 80 slices shown (1 of 4)]
[im 1/80]
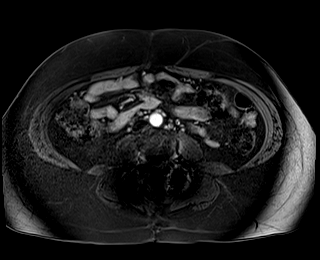
[im 40/80]
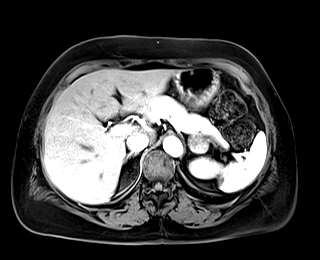
[im 80/80]
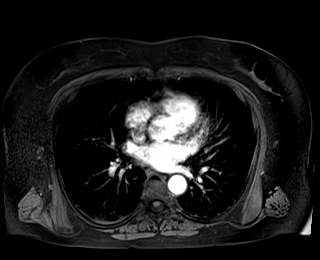

[Series 15: T1 dynamic fat-sat · axial · 3.0mm · 1.19mm/px · z∈[-94,+143]mm · 3 of 80 slices shown (2 of 5)]
[im 1/80]
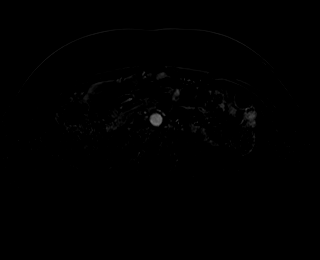
[im 40/80]
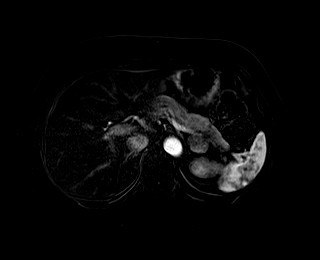
[im 80/80]
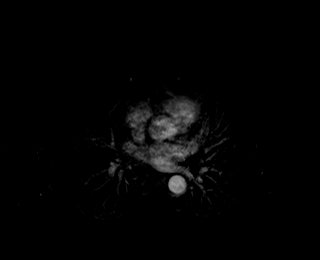

[Series 16: T1 dynamic fat-sat post-contrast · axial · 3.0mm · 1.19mm/px · z∈[-94,+143]mm · 3 of 80 slices shown (2 of 4)]
[im 1/80]
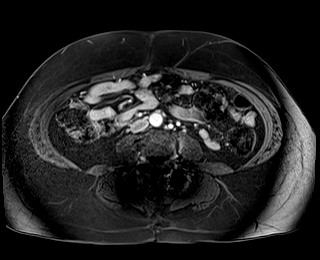
[im 40/80]
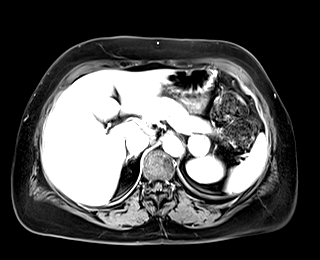
[im 80/80]
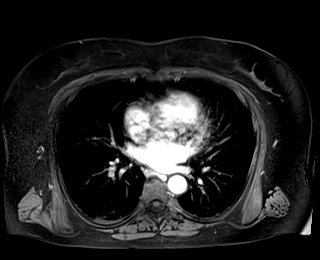

[Series 17: T1 dynamic fat-sat · axial · 3.0mm · 1.19mm/px · z∈[-94,+143]mm · 3 of 80 slices shown (3 of 5)]
[im 1/80]
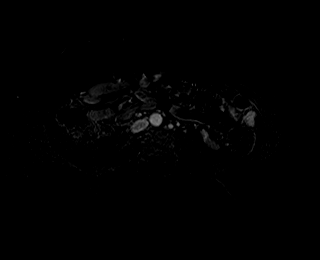
[im 40/80]
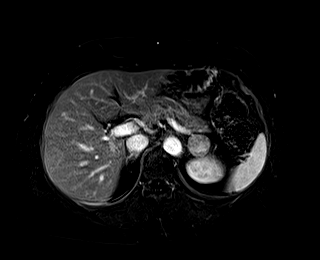
[im 80/80]
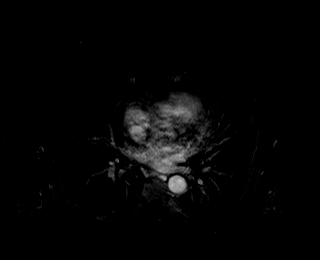

[Series 18: T1 dynamic fat-sat post-contrast · axial · 3.0mm · 1.19mm/px · z∈[-94,+143]mm · 3 of 80 slices shown (3 of 4)]
[im 1/80]
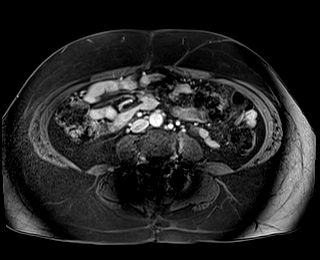
[im 40/80]
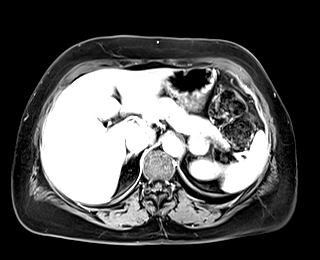
[im 80/80]
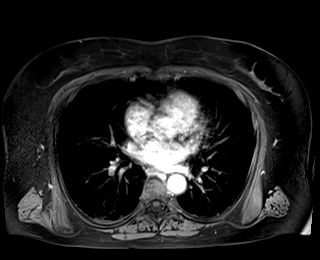

[Series 19: T1 dynamic fat-sat · axial · 3.0mm · 1.19mm/px · z∈[-94,+143]mm · 3 of 80 slices shown (4 of 5)]
[im 1/80]
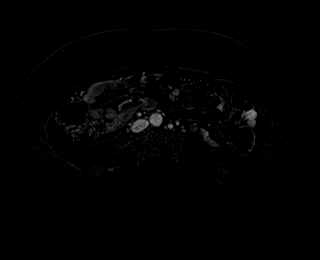
[im 40/80]
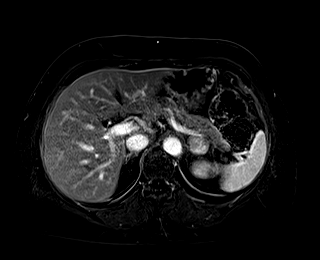
[im 80/80]
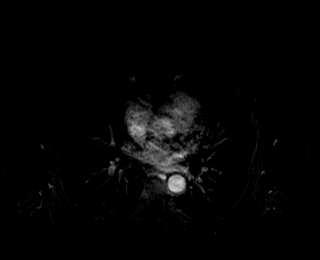

[Series 20: T1 dynamic post-contrast · coronal · 3.0mm · 1.31mm/px · 3 of 72 slices shown]
[im 1/72]
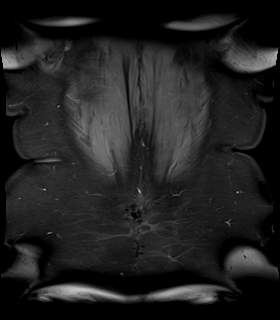
[im 36/72]
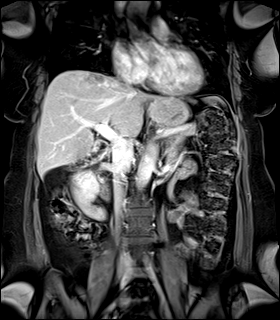
[im 72/72]
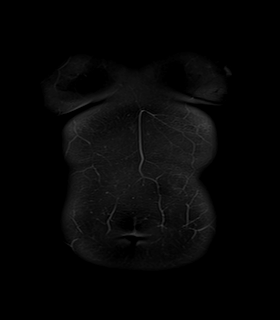

[Series 21: T1 dynamic fat-sat post-contrast · axial · 3.0mm · 1.19mm/px · z∈[-94,+143]mm · 3 of 80 slices shown (4 of 4)]
[im 1/80]
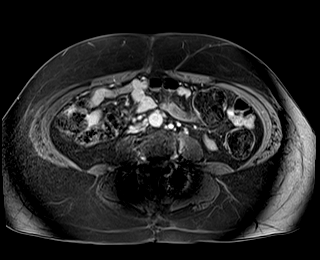
[im 40/80]
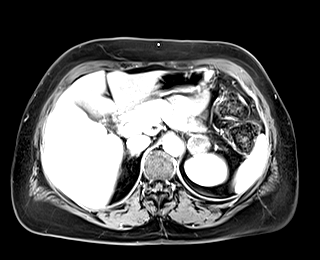
[im 80/80]
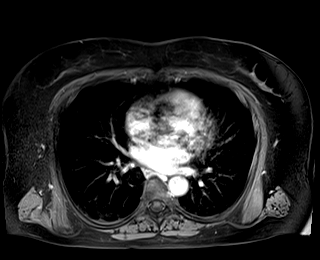

[Series 22: T1 dynamic fat-sat · axial · 3.0mm · 1.19mm/px · z∈[-94,+143]mm · 3 of 80 slices shown (5 of 5)]
[im 1/80]
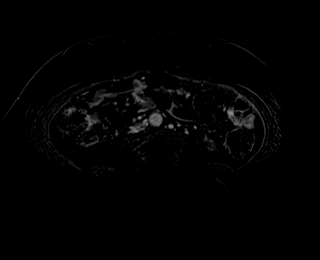
[im 40/80]
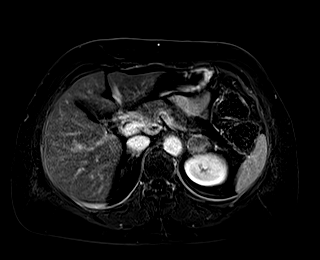
[im 80/80]
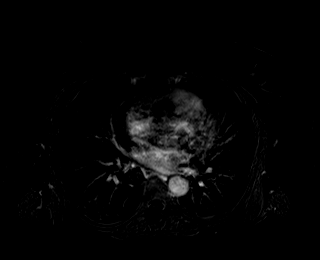

[48 of 48 positions shown; findings below may reference images not displayed]

FINDINGS: Lower chest: No acute findings.

Hepatobiliary: No hepatic masses identified. Two small cysts seen in
the lateral segment of the left hepatic lobe. Gallbladder is
unremarkable. No evidence of biliary ductal dilatation.

Pancreas:  No mass or inflammatory changes.

Spleen:  Within normal limits in size and appearance.

Adrenals/Urinary Tract: A 2.6 cm homogeneous left adrenal mass shows
prominent signal dropout on chemical shift imaging, consistent with
a benign adenoma. The right adrenal gland and both kidneys are
unremarkable in appearance. No renal masses identified. No evidence
of hydronephrosis.

Stomach/Bowel: Tiny hiatal hernia noted.  Otherwise unremarkable.

Vascular/Lymphatic: No pathologically enlarged lymph nodes
identified. No abdominal aortic aneurysm.

Other:  None.

Musculoskeletal:  No suspicious bone lesions identified.
IMPRESSION: 2.6 cm benign left adrenal adenoma.

## 2020-06-29 MED ORDER — ONDANSETRON 8 MG PO TBDP: 8.0000 mg | ORAL_TABLET | Freq: Three times a day (TID) | ORAL | 0 refills | Status: DC | PRN

## 2020-06-29 MED ORDER — OMEPRAZOLE 40 MG PO CPDR: 40.0000 mg | DELAYED_RELEASE_CAPSULE | Freq: Two times a day (BID) | ORAL | 3 refills | Status: DC

## 2020-06-29 MED ORDER — GADOBUTROL 1 MMOL/ML IV SOLN
9.0000 mL | Freq: Once | INTRAVENOUS | Status: AC | PRN
  Administered 2020-06-29: 9 mL via INTRAVENOUS

## 2020-06-29 NOTE — Progress Notes (Signed)
Virtual Visit via Video Note  I connected with Maureen Randall  on 06/29/20 at 10:20 AM EST by a video enabled telemedicine application and verified that I am speaking with the correct person using two identifiers.  Location patient: home, [place] Location provider:work or home office Persons participating in the virtual visit: patient, provider  I discussed the limitations of evaluation and management by telemedicine and the availability of in person appointments. The patient expressed understanding and agreed to proceed.   HPI: 1. [name] visit [facility] 12/12 for n/v periumbilical ab pain with abnormal MRI, gastritis and MRI with sliding HH, b/l adrenal lesions left 2.6 cm and right multiple rec MRI to better characterize. She is having daily n/v tried zofran 4 mg not helping tried zofran 8 mg odt and help n/v intractable Gastritis not controlled on prilosec 40 mg qd and IUD on imaging not in correct location saw Dr. Leafy Ro 06/28/20 and [place] normal per pt   ROS: See pertinent positives and negatives per HPI.  Past Medical History:  Diagnosis Date  . Anxiety   . Arthritis    osteoarthritis-joints, feet, shoulder  . Asthma    previously diagnosed after a bout of bronchitis/ no problems since  . Chronic pain syndrome   . COPD (chronic obstructive pulmonary disease) ([place]) 2004   patient states probably bronchitis  . Depression   . DVT (deep venous thrombosis) ([place]) 2012   right leg. after surgery, while on BCP  . GERD (gastroesophageal reflux disease)   . Hyperlipidemia   . Neuropathy    post surgery  . PVD (peripheral vascular disease) ([place])   . Spondylolisthesis     Past Surgical History:  Procedure Laterality Date  . ABLATION     veins in lower legs  . BACK SURGERY  07/2014   lumbar spine/ fusion 1 level  . BREAST BIOPSY Left 2011   neg  . CARPAL TUNNEL RELEASE Right 12/2015   [facility]  . CARPAL TUNNEL RELEASE Left 12/2016  . COLONOSCOPY WITH PROPOFOL N/A 02/29/2020   Procedure:  COLONOSCOPY WITH PROPOFOL;  Surgeon: [name], MD;  Location: [facility];  Service: Endoscopy;  Laterality: N/A;  . ESOPHAGOGASTRODUODENOSCOPY (EGD) WITH PROPOFOL N/A 02/29/2020   Procedure: ESOPHAGOGASTRODUODENOSCOPY (EGD) WITH PROPOFOL;  Surgeon: [name], MD;  Location: [place];  Service: Endoscopy;  Laterality: N/A;  . FOOT SURGERY Right 2015   broken navicular bone  . HIP ARTHROSCOPY Right 05/2011  . HIP SURGERY     left hip surgery 05/2018  . JOINT REPLACEMENT Bilateral 12/2017 & 05/2018   hip  . MASS EXCISION Left 12/17/2019   Procedure: EXCISION TUMOR FOOT DEEP LEFT;  Surgeon: Albertine Patricia, DPM;  Location: [facility];  Service: Podiatry;  Laterality: Left;  LMA W/LOCAL  . NASAL TURBINATE REDUCTION Bilateral 06/12/2019   Procedure: TURBINATE REDUCTION/SUBMUCOSAL RESECTION;  Surgeon: [name], MD;  Location: [place];  Service: ENT;  Laterality: Bilateral;  . 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 ROTATOR CUFF REPAIR     Dr. [name] in 04/2019   . SCLEROTHERAPY     veins in lower legs  . SEPTOPLASTY Bilateral 06/12/2019   Procedure: SEPTOPLASTY;  Surgeon: [name], MD;  Location: [place];  Service: ENT;  Laterality: Bilateral;  . SHOULDER SURGERY Right 12/2015   [facility] torn rotator cuff  . SPINAL FUSION  2016   [facility] lumbar     Current Outpatient  Medications:  .  baclofen (LIORESAL) 10 MG tablet, Take 10 mg by mouth 3 (three) times daily as needed for muscle spasms., Disp: , Rfl:  .  diclofenac Sodium (VOLTAREN) 1 % GEL, Apply 2-4 g topically 4 (four) times daily. 2 gram upper body qid prn and 4 gram lower body qid prn, Disp: 100 g, Rfl: 11 .  DULoxetine (CYMBALTA) 60 MG capsule, TAKE 1 CAPSULE BY MOUTH EVERY DAY, Disp: 90 capsule, Rfl: 1 .  Elastic Bandages & Supports (RELIEF KNEE) MISC, Apply in the morning and remove at night., Disp: , Rfl:  .   estradiol (ESTRACE) 0.1 MG/GM vaginal cream, INSERT PEA SIZED AMOUNT (0.5G) INTO VAGINA NIGHTLY EVERY OTHER NIGHT FOR 2 WEEKS, THEN TWICE WEEKLY, Disp: , Rfl:  .  famotidine (PEPCID) 20 MG tablet, Take 1 tablet (20 mg total) by mouth daily for 28 days., Disp: 28 tablet, Rfl: 0 .  fexofenadine (ALLEGRA) 180 MG tablet, Take 180 mg by mouth daily., Disp: , Rfl:  .  fluticasone (FLONASE) 50 MCG/ACT nasal spray, Place 1-2 sprays into both nostrils daily as needed. Max dose 2 sprays each nostril, Disp: 16 g, Rfl: 12 .  ibuprofen (ADVIL,MOTRIN) 800 MG tablet, TK 1 T PO Q 8 H WF PRN P, Disp: , Rfl: 2 .  lidocaine (XYLOCAINE) 2 % solution, Use as directed 10 mLs in the mouth or throat every 4 (four) hours as needed (abodminal pain)., Disp: 200 mL, Rfl: 0 .  linaclotide (LINZESS) 145 MCG CAPS capsule, Take 1 capsule (145 mcg total) by mouth daily before breakfast., Disp: 30 capsule, Rfl: 0 .  Melatonin 3 MG CAPS, Take by mouth., Disp: , Rfl:  .  meloxicam (MOBIC) 15 MG tablet, Take 15 mg by mouth daily., Disp: , Rfl:  .  methocarbamol (ROBAXIN) 500 MG tablet, Take 2 tablets (1,000 mg total) by mouth 2 (two) times daily as needed for muscle spasms. (Patient taking differently: Take 1,000 mg by mouth every 6 (six) hours as needed for muscle spasms.), Disp: 60 tablet, Rfl: 2 .  nitrofurantoin (MACRODANTIN) 50 MG capsule, Take 1 capsule (50 mg total) by mouth as needed. UTI dose should be 100 mg bid x 5 days UTI  or 50-100 mg qhs prevention, Disp: 120 capsule, Rfl: 3 .  oxyCODONE-acetaminophen (PERCOCET) 10-325 MG tablet, Take 1 tablet by mouth every 6 (six) hours as needed., Disp: , Rfl:  .  phentermine (ADIPEX-P) 37.5 MG tablet, Take 1 tablet (37.5 mg total) by mouth daily before breakfast., Disp: 60 tablet, Rfl: 0 .  traZODone (DESYREL) 100 MG tablet, Take 2 tablets (200 mg total) by mouth at bedtime as needed for sleep., Disp: 180 tablet, Rfl: 1 .  valACYclovir (VALTREX) 1000 MG tablet, as needed. Oral blister,  Disp: , Rfl:  .  omeprazole (PRILOSEC) 40 MG capsule, Take 1 capsule (40 mg total) by mouth 2 (two) times daily with a meal. 30 min before food, Disp: 90 capsule, Rfl: 3 .  ondansetron (ZOFRAN ODT) 8 MG disintegrating tablet, Take 1 tablet (8 mg total) by mouth every 8 (eight) hours as needed for nausea or vomiting., Disp: 40 tablet, Rfl: 0  EXAM:  VITALS per patient if applicable:  GENERAL: alert, oriented, appears well and in no acute distress  HEENT: atraumatic, conjunttiva clear, no obvious abnormalities on inspection of external nose and ears  NECK: normal movements of the head and neck  LUNGS: on inspection no signs of respiratory distress, breathing rate appears normal, no obvious gross SOB,  gasping or wheezing  CV: no obvious cyanosis  MS: moves all visible extremities without noticeable abnormality  PSYCH/NEURO: pleasant and cooperative, no obvious depression or anxiety, speech and thought processing grossly intact  ASSESSMENT AND PLAN:  Discussed the following assessment and plan:  Mass of both adrenal glands ([place]) - Plan: MR ABDOMEN W CONTRAST stat r/o benign vs other   Nausea and vomiting, intractability of vomiting not specified, unspecified vomiting type - Plan: ondansetron (ZOFRAN ODT) 8 MG disintegrating tablet tid prn Failed 4 mg tid helped by 8 mg odt   Gastroesophageal reflux disease without esophagitis - Plan: omeprazole (PRILOSEC) 40 MG capsule increase to bid f/u  Dr. Allen Norris end of 07/2020 call sooner appt prn Hiatal hernia - Plan: omeprazole (PRILOSEC) 40 MG capsule  HM Declines flu shot  Tdap utd  Pfizer 2/2 Disc shingrixprev.and today will call back and reschedule when ready for this Consider hep B vaccine in future NR 08/31/15  -disc new Hep B vaccine in the future   HCV neg 08/31/15  covid 19 2/2   Pap neg 09/11/16 neg neg HPV Will f/u Dr. Leafy Ro has IUD checked 06/28/20 normal per pt unlike MRI 06/19/20 states  Mammogram 08/12/19 ob/gyn  ordered Dr. Horace Porteous dexa 01/04/20 normal   Colonoscopy never had discussed declines for now  Disc cologuard as well  Pt will let me know  Dermatology was due 09/2018 f/u usu. [facility] Barn[facility] Per pt seen in 2021 and negative no bxs or ln2   Never smoker   Other providers Former PCP [facility] Dr. Garen Grams then [place] [facility] requested all imaging Xrays, MRIS, CT Pain Dr. Alvira Monday in [place] specialist Dr. Levada Schilling spine in [place] ENT-Dr. Tami Ribas stated pt did not have allergies of note had testing Psychiatry and therapy-following  PT  Of note meds pt gets from PCP  Valtrex  adipex  Macrobid  Robaxin  voltaren gel  04/14/19 ENT Dr. Tami Ribas sinusitis deviated nasal septum right nasal endoscopy and left nasacort 55 nasal hoarse voice small hemorrhage right cord 2/2 coughing and inhalers f/u in 6 weeks after shoulder surgery   -we discussed possible serious and likely etiologies, options for evaluation and workup, limitations of telemedicine visit vs in person visit, treatment, treatment risks and precautions.     I discussed the assessment and treatment plan with the patient. The patient was provided an opportunity to ask questions and all were answered. The patient agreed with the plan and demonstrated an understanding of the instructions.    Time spent 20 min Delorise Jackson, MD

## 2020-06-29 NOTE — Telephone Encounter (Signed)
Patient verbalized understanding she states that is what the ER doctor told her she had. She said she does not think the ER doctor was doing his job and new what he was doing. She states she is going to talk to her PCP today and will call us if she is not better next week

## 2020-06-29 NOTE — Addendum Note (Signed)
Addended by: Orland Mustard on: 06/29/2020 12:35 PM   Modules accepted: Orders

## 2020-07-20 ENCOUNTER — Telehealth: Payer: 59 | Admitting: Psychiatry

## 2020-07-26 ENCOUNTER — Telehealth (INDEPENDENT_AMBULATORY_CARE_PROVIDER_SITE_OTHER): Payer: 59 | Admitting: Internal Medicine

## 2020-07-26 ENCOUNTER — Other Ambulatory Visit: Payer: Self-pay

## 2020-07-26 ENCOUNTER — Encounter: Payer: Self-pay | Admitting: Internal Medicine

## 2020-07-26 ENCOUNTER — Ambulatory Visit: Payer: 59 | Admitting: Internal Medicine

## 2020-07-26 VITALS — BP 128/85 | Ht 67.0 in | Wt 202.0 lb

## 2020-07-26 DIAGNOSIS — R030 Elevated blood-pressure reading, without diagnosis of hypertension: Secondary | ICD-10-CM

## 2020-07-26 DIAGNOSIS — E669 Obesity, unspecified: Secondary | ICD-10-CM | POA: Diagnosis not present

## 2020-07-26 DIAGNOSIS — R112 Nausea with vomiting, unspecified: Secondary | ICD-10-CM

## 2020-07-26 DIAGNOSIS — Z1389 Encounter for screening for other disorder: Secondary | ICD-10-CM

## 2020-07-26 DIAGNOSIS — K219 Gastro-esophageal reflux disease without esophagitis: Secondary | ICD-10-CM

## 2020-07-26 DIAGNOSIS — D3502 Benign neoplasm of left adrenal gland: Secondary | ICD-10-CM

## 2020-07-26 DIAGNOSIS — M1990 Unspecified osteoarthritis, unspecified site: Secondary | ICD-10-CM

## 2020-07-26 DIAGNOSIS — K449 Diaphragmatic hernia without obstruction or gangrene: Secondary | ICD-10-CM

## 2020-07-26 DIAGNOSIS — R296 Repeated falls: Secondary | ICD-10-CM

## 2020-07-26 DIAGNOSIS — E785 Hyperlipidemia, unspecified: Secondary | ICD-10-CM

## 2020-07-26 DIAGNOSIS — Z1329 Encounter for screening for other suspected endocrine disorder: Secondary | ICD-10-CM

## 2020-07-26 DIAGNOSIS — G5732 Lesion of lateral popliteal nerve, left lower limb: Secondary | ICD-10-CM

## 2020-07-26 HISTORY — DX: Benign neoplasm of left adrenal gland: D35.02

## 2020-07-26 MED ORDER — PHENTERMINE HCL 37.5 MG PO TABS: 37.5000 mg | ORAL_TABLET | Freq: Every day | ORAL | 0 refills | Status: DC

## 2020-07-26 MED ORDER — DICLOFENAC SODIUM 1 % EX GEL
2.0000 g | Freq: Four times a day (QID) | CUTANEOUS | 11 refills | Status: DC
Start: 2020-07-26 — End: 2021-08-08

## 2020-07-26 MED ORDER — ONDANSETRON 8 MG PO TBDP: 8.0000 mg | ORAL_TABLET | Freq: Three times a day (TID) | ORAL | 0 refills | Status: DC | PRN

## 2020-07-26 MED ORDER — FAMOTIDINE 20 MG PO TABS: 20.0000 mg | ORAL_TABLET | Freq: Every day | ORAL | 0 refills | Status: DC

## 2020-07-26 NOTE — Progress Notes (Signed)
Virtual Visit via Video Note  I connected with Maureen Randall  on 07/26/20 at 11:45 AM EST by a video enabled telemedicine application and verified that I am speaking with the correct person using two identifiers.  Location patient: home, [place] Location provider:work or home office Persons participating in the virtual visit: patient, provider  I discussed the limitations of evaluation and management by telemedicine and the availability of in person appointments. The patient expressed understanding and agreed to proceed.   HPI: 1. Recurrent falls x 2 1 as stepping on step stool on bed and fell off stool and landed on right shoulder h/o torn b/l rotator cuffs and seeing Dr. [name] [facility] MRI sch 08/08/20 right shoulder  Fall happened when leg left gave out and she is trying to determine if this was due to back issues prior back surgery or hip. She has tried radiofreq ablation in the past has not helped with neuropathy but has appt next week for L2/3 or L3/4 to do Radiofreq ablation again  PT has not helped in the past esp after back surgery 09/2019 and dry needling did not help H/o peroneal neuropathy we disc PT for walking and strength or if specialists think she needs operative decompressive therapy she denies left foot drop NS Dr. Tyler Pita at [facility] disc consider hip/low back EMG to determine if leg weakness coming from hip of back   2. MRI 06/29/20 benign 2.6 cm left adrenal adenoma  3.nausea, small hiatal hernia uncontrolled GERD on pepcid 20 mg qhs and omeprazole 40 mg qam f/u Dr. Allen Norris sch 08/05/20  Also ask Dr. Allen Norris and NSAIDS use including duexis S/p EGD and colonoscopy 02/2020  4. Obesity wants to get back on adipex rec monitor BP on this med BP 125/85 last week from prior appt per pt  5. Left ganglion cyst removal surgery 12/2019 left with scar tissue in her foot and then saw [facility] Dr. Tyler Pita  ROS: See pertinent positives and negatives per HPI.  Past Medical History:   Diagnosis Date  . Anxiety   . Arthritis    osteoarthritis-joints, feet, shoulder  . Asthma    previously diagnosed after a bout of bronchitis/ no problems since  . Chronic pain syndrome   . COPD (chronic obstructive pulmonary disease) ([place]) 2004   patient states probably bronchitis  . Depression   . DVT (deep venous thrombosis) ([place]) 2012   right leg. after surgery, while on BCP  . GERD (gastroesophageal reflux disease)   . Hyperlipidemia   . Neuropathy    post surgery  . PVD (peripheral vascular disease) ([place])   . Spondylolisthesis     Past Surgical History:  Procedure Laterality Date  . ABLATION     veins in lower legs  . BACK SURGERY  07/2014   lumbar spine/ fusion 1 level  . BREAST BIOPSY Left 2011   neg  . CARPAL TUNNEL RELEASE Right 12/2015   [facility]  . CARPAL TUNNEL RELEASE Left 12/2016  . COLONOSCOPY WITH PROPOFOL N/A 02/29/2020   Procedure: COLONOSCOPY WITH PROPOFOL;  Surgeon: [name], MD;  Location: [facility];  Service: Endoscopy;  Laterality: N/A;  . ESOPHAGOGASTRODUODENOSCOPY (EGD) WITH PROPOFOL N/A 02/29/2020   Procedure: ESOPHAGOGASTRODUODENOSCOPY (EGD) WITH PROPOFOL;  Surgeon: [name], MD;  Location: [facility];  Service: Endoscopy;  Laterality: N/A;  . FOOT SURGERY Right 2015   broken navicular bone  . HIP ARTHROSCOPY Right 05/2011  . HIP SURGERY     left  hip surgery 05/2018  . JOINT REPLACEMENT Bilateral 12/2017 & 05/2018   hip  . L5-S1 OLIF Dr. Rennis Harding back surgery 09/2019  09/28/2019  . left ganglion cyst removal     12/23/19 Dr. Elvina Mattes  . MASS EXCISION Left 12/17/2019   Procedure: EXCISION TUMOR FOOT DEEP LEFT;  Surgeon: Albertine Patricia, DPM;  Location: [facility];  Service: Podiatry;  Laterality: Left;  LMA W/LOCAL  . NASAL TURBINATE REDUCTION Bilateral 06/12/2019   Procedure: TURBINATE REDUCTION/SUBMUCOSAL RESECTION;  Surgeon: [name], MD;  Location: [place];  Service: ENT;   Laterality: Bilateral;  . 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 ROTATOR CUFF REPAIR     Dr. [name] in 04/2019   . SCLEROTHERAPY     veins in lower legs  . SEPTOPLASTY Bilateral 06/12/2019   Procedure: SEPTOPLASTY;  Surgeon: [name], MD;  Location: [place];  Service: ENT;  Laterality: Bilateral;  . SHOULDER SURGERY Right 12/2015   [facility] torn rotator cuff  . SPINAL FUSION  2016   [facility] lumbar     Current Outpatient Medications:  .  baclofen (LIORESAL) 10 MG tablet, Take 10 mg by mouth 3 (three) times daily as needed for muscle spasms., Disp: , Rfl:  .  Elastic Bandages & Supports (RELIEF KNEE) MISC, Apply in the morning and remove at night., Disp: , Rfl:  .  estradiol (ESTRACE) 0.1 MG/GM vaginal cream, INSERT PEA SIZED AMOUNT (0.5G) INTO VAGINA NIGHTLY EVERY OTHER NIGHT FOR 2 WEEKS, THEN TWICE WEEKLY, Disp: , Rfl:  .  fluticasone (FLONASE) 50 MCG/ACT nasal spray, Place 1-2 sprays into both nostrils daily as needed. Max dose 2 sprays each nostril, Disp: 16 g, Rfl: 12 .  linaclotide (LINZESS) 145 MCG CAPS capsule, Take 1 capsule (145 mcg total) by mouth daily before breakfast., Disp: 30 capsule, Rfl: 0 .  Melatonin 3 MG CAPS, Take by mouth., Disp: , Rfl:  .  omeprazole (PRILOSEC) 40 MG capsule, Take 1 capsule (40 mg total) by mouth 2 (two) times daily with a meal. 30 min before food, Disp: 90 capsule, Rfl: 3 .  oxyCODONE-acetaminophen (PERCOCET) 10-325 MG tablet, Take 1 tablet by mouth every 6 (six) hours as needed., Disp: , Rfl:  .  phentermine (ADIPEX-P) 37.5 MG tablet, Take 1 tablet (37.5 mg total) by mouth daily before breakfast. rx 2/2, Disp: 60 tablet, Rfl: 0 .  traZODone (DESYREL) 100 MG tablet, Take 2 tablets (200 mg total) by mouth at bedtime as needed for sleep., Disp: 180 tablet, Rfl: 1 .  valACYclovir (VALTREX) 1000 MG tablet, as needed. Oral blister, Disp: , Rfl:  .  diclofenac Sodium (VOLTAREN) 1 % GEL,  Apply 2-4 g topically 4 (four) times daily. 2 gram upper body qid prn and 4 gram lower body qid prn, Disp: 100 g, Rfl: 11 .  DULoxetine (CYMBALTA) 60 MG capsule, TAKE 1 CAPSULE BY MOUTH EVERY DAY (Patient not taking: Reported on 07/26/2020), Disp: 90 capsule, Rfl: 1 .  famotidine (PEPCID) 20 MG tablet, Take 1 tablet (20 mg total) by mouth at bedtime., Disp: 90 tablet, Rfl: 0 .  fexofenadine (ALLEGRA) 180 MG tablet, Take 180 mg by mouth daily. (Patient not taking: Reported on 07/26/2020), Disp: , Rfl:  .  ibuprofen (ADVIL,MOTRIN) 800 MG tablet, TK 1 T PO Q 8 H WF PRN P, Disp: , Rfl: 2 .  meloxicam (MOBIC) 15 MG tablet, Take 15 mg by mouth daily. (Patient not taking:  Reported on 07/26/2020), Disp: , Rfl:  .  methocarbamol (ROBAXIN) 500 MG tablet, Take 2 tablets (1,000 mg total) by mouth 2 (two) times daily as needed for muscle spasms. (Patient not taking: Reported on 07/26/2020), Disp: 60 tablet, Rfl: 2 .  ondansetron (ZOFRAN ODT) 8 MG disintegrating tablet, Take 1 tablet (8 mg total) by mouth every 8 (eight) hours as needed for nausea or vomiting., Disp: 40 tablet, Rfl: 0 .  phentermine (ADIPEX-P) 37.5 MG tablet, Take 1 tablet (37.5 mg total) by mouth daily before breakfast. rx 1/2, Disp: 60 tablet, Rfl: 0  EXAM:  VITALS per patient if applicable:  GENERAL: alert, oriented, appears well and in no acute distress  HEENT: atraumatic, conjunttiva clear, no obvious abnormalities on inspection of external nose and ears  NECK: normal movements of the head and neck  LUNGS: on inspection no signs of respiratory distress, breathing rate appears normal, no obvious gross SOB, gasping or wheezing  CV: no obvious cyanosis  MS: moves all visible extremities without noticeable abnormality  PSYCH/NEURO: pleasant and cooperative, no obvious depression or anxiety, speech and thought processing grossly intact  ASSESSMENT AND PLAN:  Discussed the following assessment and plan:  Recurrent falls Consider PT  again I.e walking and strengthening in the future   Obesity (BMI 30-39.9) - Plan: phentermine (ADIPEX-P) 37.5 MG tablet, phentermine (ADIPEX-P) 37.5 MG tablet Monitor BP   Nausea and vomiting, intractability of vomiting not specified, unspecified vomiting type - Plan: ondansetron (ZOFRAN ODT) 8 MG disintegrating tablet Gastroesophageal reflux disease without esophagitis - Plan: famotidine (PEPCID) 20 MG tablet Hiatal hernia F/u Dr. Allen Norris 08/05/20  Arthritis - Plan: diclofenac Sodium (VOLTAREN) 1 % GEL  Peroneal neuropathy, left -f/u NS and ortho in the future may do EMG for lower back and hip per NS per pr  Consider PT   Adenoma of left adrenal gland 2.6 cm left benign no need for f/u  Hyperlipidemia, unspecified hyperlipidemia type  HM Fasting labs 10/2020 Declines flu shot  Tdap utd  Pfizer 3/3 Disc shingrixprev.and today will call back and reschedulewhen ready for this Consider hep B vaccine in future NR 08/31/15 -disc new Hep B vaccine in the futurex 2 doses   HCV neg 08/31/15  Pap neg 09/11/16 neg neg HPV Will f/u Dr. Leafy Ro has IUD checked 06/28/20 and pap neg neg HPV  Mammogram 08/12/19 ob/gyn ordered Dr. Esaw Grandchild 08/16/20  dexa 01/04/20 normal   Colonoscopy utd had 02/2020 f/u in 10 years with EGD Dr. Allen Norris   Dermatology was due 09/2018 f/u usu. [facility] Per pt seen in 2021 and negative no bxs or ln2  Never smoker   Other providers Former PCP [facility] Dr. Garen Grams then [place] [facility] requested all imaging Xrays, MRIS, CT Pain Dr. Alvira Monday in [place] specialist Dr. Levada Schilling spine in [place] ENT-Dr. Tami Ribas stated pt did not have allergies of note had testing Psychiatry and therapy-following  PT [facility] Dr. Tyler Pita [facility]  Ortho spine Dr. Rennis Harding  Ortho Dr .[name] [facility] in [place]   Of note meds pt gets from PCP  Valtrex  adipex  Macrobid  Robaxin   voltaren gel  04/14/19 ENT Dr. Tami Ribas sinusitis deviated nasal septum right nasal endoscopy and left nasacort 55 nasal hoarse voice small hemorrhage right cord 2/2 coughing and inhalers f/u in 6 weeks after shoulder surgery -we discussed possible serious and likely etiologies, options for evaluation and workup, limitations of telemedicine  visit vs in person visit, treatment, treatment risks and precautions.    I discussed the assessment and treatment plan with the patient. The patient was provided an opportunity to ask questions and all were answered. The patient agreed with the plan and demonstrated an understanding of the instructions.    Time spent 20 min Delorise Jackson, MD

## 2020-08-03 DIAGNOSIS — M19019 Primary osteoarthritis, unspecified shoulder: Secondary | ICD-10-CM | POA: Insufficient documentation

## 2020-08-04 ENCOUNTER — Ambulatory Visit (INDEPENDENT_AMBULATORY_CARE_PROVIDER_SITE_OTHER): Payer: 59 | Admitting: Gastroenterology

## 2020-08-04 ENCOUNTER — Encounter: Payer: Self-pay | Admitting: Gastroenterology

## 2020-08-04 ENCOUNTER — Encounter: Payer: Self-pay | Admitting: Internal Medicine

## 2020-08-04 ENCOUNTER — Other Ambulatory Visit: Payer: Self-pay

## 2020-08-04 VITALS — BP 113/76 | HR 79 | Ht 67.0 in | Wt 202.4 lb

## 2020-08-04 DIAGNOSIS — K219 Gastro-esophageal reflux disease without esophagitis: Secondary | ICD-10-CM | POA: Diagnosis not present

## 2020-08-04 DIAGNOSIS — T8332XA Displacement of intrauterine contraceptive device, initial encounter: Secondary | ICD-10-CM | POA: Insufficient documentation

## 2020-08-04 NOTE — Progress Notes (Signed)
Primary Care Physician: McLean-Scocuzza, Nino Glow, MD  Primary Gastroenterologist:  Dr. [name]  Chief Complaint  Patient presents with  . Abdominal Pain    HPI: Maureen Randall is a 55 y.o. female here for follow-up of her GERD.  The patient was started on Protonix from what she states when she was last seen by me and states that it did not work for her. The patient is now on 40 mg of omeprazole twice a day and she states that she takes Pepcid in the evening.  She continues to have acid coming up her esophagus and into her mouth.  She states she has burning quite frequently.  She also reports that she went to the ER because her symptoms are so bad and she was given a GI cocktail and that is when the Pepcid was added.  There is no report of any dysphagia associate with this.  Past Medical History:  Diagnosis Date  . Anxiety   . Arthritis    osteoarthritis-joints, feet, shoulder  . Asthma    previously diagnosed after a bout of bronchitis/ no problems since  . Chronic pain syndrome   . COPD (chronic obstructive pulmonary disease) ([place]) 2004   patient states probably bronchitis  . Depression   . DVT (deep venous thrombosis) ([place]) 2012   right leg. after surgery, while on BCP  . GERD (gastroesophageal reflux disease)   . Hyperlipidemia   . Neuropathy    post surgery  . PVD (peripheral vascular disease) ([place])   . Spondylolisthesis     Current Outpatient Medications  Medication Sig Dispense Refill  . baclofen (LIORESAL) 10 MG tablet Take 10 mg by mouth 3 (three) times daily as needed for muscle spasms.    . diclofenac Sodium (VOLTAREN) 1 % GEL Apply 2-4 g topically 4 (four) times daily. 2 gram upper body qid prn and 4 gram lower body qid prn 100 g 11  . DULoxetine (CYMBALTA) 60 MG capsule TAKE 1 CAPSULE BY MOUTH EVERY DAY (Patient not taking: No sig reported) 90 capsule 1  . Elastic Bandages & Supports (RELIEF KNEE) MISC Apply in the morning and remove at night.    .  estradiol (ESTRACE) 0.1 MG/GM vaginal cream INSERT PEA SIZED AMOUNT (0.5G) INTO VAGINA NIGHTLY EVERY OTHER NIGHT FOR 2 WEEKS, THEN TWICE WEEKLY    . famotidine (PEPCID) 20 MG tablet Take 1 tablet (20 mg total) by mouth at bedtime. 90 tablet 0  . fluticasone (FLONASE) 50 MCG/ACT nasal spray Place 1-2 sprays into both nostrils daily as needed. Max dose 2 sprays each nostril 16 g 12  . linaclotide (LINZESS) 145 MCG CAPS capsule Take 1 capsule (145 mcg total) by mouth daily before breakfast. 30 capsule 0  . Melatonin 3 MG CAPS Take by mouth.    . methocarbamol (ROBAXIN) 500 MG tablet Take 2 tablets (1,000 mg total) by mouth 2 (two) times daily as needed for muscle spasms. (Patient not taking: No sig reported) 60 tablet 2  . omeprazole (PRILOSEC) 40 MG capsule Take 1 capsule (40 mg total) by mouth 2 (two) times daily with a meal. 30 min before food 90 capsule 3  . ondansetron (ZOFRAN ODT) 8 MG disintegrating tablet Take 1 tablet (8 mg total) by mouth every 8 (eight) hours as needed for nausea or vomiting. 40 tablet 0  . oxyCODONE-acetaminophen (PERCOCET) 10-325 MG tablet Take 1 tablet by mouth every 6 (six) hours as needed.    . phentermine (ADIPEX-P) 37.5 MG  tablet Take 1 tablet (37.5 mg total) by mouth daily before breakfast. rx 1/2 60 tablet 0  . pregabalin (LYRICA) 50 MG capsule TAKE 1 TO 2 CAPSULES BY MOUTH EVERY 12 HOURS AS NEEDED    . traZODone (DESYREL) 100 MG tablet Take 2 tablets (200 mg total) by mouth at bedtime as needed for sleep. 180 tablet 1  . valACYclovir (VALTREX) 1000 MG tablet as needed. Oral blister     No current facility-administered medications for this visit.    Allergies as of 08/04/2020 - Review Complete 08/04/2020  Allergen Reaction Noted  . Amoxicillin Hives 06/14/2018  . Septra [sulfamethoxazole-trimethoprim] Hives 01/29/2018  . Cephalexin Rash 01/29/2018    ROS:  General: Negative for anorexia, weight loss, fever, chills, fatigue, weakness. ENT: Negative for  hoarseness, difficulty swallowing , nasal congestion. CV: Negative for chest pain, angina, palpitations, dyspnea on exertion, peripheral edema.  Respiratory: Negative for dyspnea at rest, dyspnea on exertion, cough, sputum, wheezing.  GI: See history of present illness. GU:  Negative for dysuria, hematuria, urinary incontinence, urinary frequency, nocturnal urination.  Endo: Negative for unusual weight change.    Physical Examination:   BP 113/76   Pulse 79   Ht 5\' 7"  (1.702 m)   Wt 202 lb 6.4 oz (91.8 kg)   BMI 31.70 kg/m   General: Well-nourished, well-developed in no acute distress.  Eyes: No icterus. Conjunctivae pink. Lungs: Clear to auscultation bilaterally. Non-labored. Heart: Regular rate and rhythm, no murmurs rubs or gallops.  Abdomen: Bowel sounds are normal, nontender, nondistended, no hepatosplenomegaly or masses, no abdominal bruits or hernia , no rebound or guarding.   Extremities: No lower extremity edema. No clubbing or deformities. Neuro: Alert and oriented x 3.  Grossly intact. Skin: Warm and dry, no jaundice.   Psych: Alert and cooperative, normal mood and affect.  Labs:    Imaging Studies: No results found.  Assessment and Plan:   Maureen Randall is a 55 y.o. y/o female who comes in today with a history of acid reflux with an acid taste in her mouth while on a dose of 40 mg of omeprazole twice a day in addition to Pepcid in the evening. The patient has been told that she will be switched to [place] that she has been given samples of this and to take it with the Pepcid.  She has also been told that if her acid symptoms do not improve on this medication that she may need to be set up for a pH study to see if she is actually having acid breakthrough or non-acid reflux.  The patient has been explained the plan and agrees with it.     [name], MD. Marval Regal    Note: This dictation was prepared with Dragon dictation along with smaller phrase technology. Any  transcriptional errors that result from this process are unintentional.

## 2020-08-08 ENCOUNTER — Telehealth: Payer: Self-pay

## 2020-08-08 NOTE — Telephone Encounter (Signed)
Pt called and scheduled appointment for Shingrix for 08/17/20. I just want to make sure this was ok with Dr. Olivia Mackie for patient to schedule?

## 2020-08-08 NOTE — Telephone Encounter (Signed)
This is fine shingrix vaccine sch 08/17/20

## 2020-08-08 NOTE — Telephone Encounter (Signed)
Please advise   Okay for vaccine?

## 2020-08-08 NOTE — Telephone Encounter (Signed)
Noted  

## 2020-08-10 ENCOUNTER — Other Ambulatory Visit: Payer: Self-pay

## 2020-08-10 ENCOUNTER — Telehealth (INDEPENDENT_AMBULATORY_CARE_PROVIDER_SITE_OTHER): Payer: 59 | Admitting: Psychiatry

## 2020-08-10 ENCOUNTER — Encounter: Payer: Self-pay | Admitting: Psychiatry

## 2020-08-10 DIAGNOSIS — F411 Generalized anxiety disorder: Secondary | ICD-10-CM

## 2020-08-10 DIAGNOSIS — G4701 Insomnia due to medical condition: Secondary | ICD-10-CM

## 2020-08-10 NOTE — Progress Notes (Signed)
Virtual Visit via Video Note  I connected with Maureen Randall on 08/10/20 at  3:40 PM EST by a video enabled telemedicine application and verified that I am speaking with the correct person using two identifiers.  Location Provider Location : [facility] Patient Location : Home  Participants: Patient , Provider   I discussed the limitations of evaluation and management by telemedicine and the availability of in person appointments. The patient expressed understanding and agreed to proceed.  I discussed the assessment and treatment plan with the patient. The patient was provided an opportunity to ask questions and all were answered. The patient agreed with the plan and demonstrated an understanding of the instructions.   The patient was advised to call back or seek an in-person evaluation if the symptoms worsen or if the condition fails to improve as anticipated.  [facility] MD OP Progress Note  08/10/2020 5:23 PM Maureen Randall  MRN:  [id number]  Chief Complaint:  Chief Complaint    Follow-up     HPI: Maureen Randall is a 55 year old Caucasian female who has a history of GAD, insomnia, chronic pain, primary osteoarthritis, lumbar radiculopathy, degenerative disc disease, spondylolisthesis, bilateral carpal tunnel syndrome, was evaluated by telemedicine today.  Patient today reports she is currently doing better with regards to her foot pain.  Patient reports however she continues to struggle with back pain.  She is currently on Lyrica and oxycodone.  That does help to some extent.  She recently had an ablation of her back done.  She however reports that likely made her pain worse.  She however reports she was advised to wait for a few days before making more changes.  Patient reports mood wise she is doing okay.  Denies any significant anxiety symptoms.  Reports she is compliant on the Cymbalta which helps.  Patient reports sleep is good on the trazodone and melatonin combination.  Patient denies any  suicidality, homicidality or perceptual disturbances.  Patient denies any other concerns today.  Visit Diagnosis:    ICD-10-CM   1. Generalized anxiety disorder  F41.1   2. Insomnia due to medical condition  G47.01    anxiety, pain    Past Psychiatric History: I have reviewed past psychiatric history from my progress note on 04/30/2018.  Past Medical History:  Past Medical History:  Diagnosis Date   Anxiety    Arthritis    osteoarthritis-joints, feet, shoulder   Asthma    previously diagnosed after a bout of bronchitis/ no problems since   Chronic pain syndrome    COPD (chronic obstructive pulmonary disease) ([place]) 2004   patient states probably bronchitis   Depression    DVT (deep venous thrombosis) ([place] [facility]) 2012   right leg. after surgery, while on BCP   GERD (gastroesophageal reflux disease)    Hyperlipidemia    Neuropathy    post surgery   PVD (peripheral vascular disease) ([facility])    Spondylolisthesis     Past Surgical History:  Procedure Laterality Date   ABLATION     veins in lower legs   BACK SURGERY  07/2014   lumbar spine/ fusion 1 level   BREAST BIOPSY Left 2011   neg   CARPAL TUNNEL RELEASE Right 12/2015   [facility]   CARPAL TUNNEL RELEASE Left 12/2016   COLONOSCOPY WITH PROPOFOL N/A 02/29/2020   Procedure: COLONOSCOPY WITH PROPOFOL;  Surgeon: [name], MD;  Location: [facility];  Service: Endoscopy;  Laterality: N/A;   ESOPHAGOGASTRODUODENOSCOPY (EGD) WITH PROPOFOL N/A  02/29/2020   Procedure: ESOPHAGOGASTRODUODENOSCOPY (EGD) WITH PROPOFOL;  Surgeon: [name], MD;  Location: [facility];  Service: Endoscopy;  Laterality: N/A;   FOOT SURGERY Right 2015   broken navicular bone   HIP ARTHROSCOPY Right 05/2011   HIP SURGERY     left hip surgery 05/2018   JOINT REPLACEMENT Bilateral 12/2017 & 05/2018   hip   L5-S1 OLIF Dr. Rennis Harding back surgery 09/2019  09/28/2019   left ganglion cyst removal     12/23/19 Dr.  Elvina Mattes   MASS EXCISION Left 12/17/2019   Procedure: EXCISION TUMOR FOOT DEEP LEFT;  Surgeon: Albertine Patricia, DPM;  Location: [facility];  Service: Podiatry;  Laterality: Left;  LMA W/LOCAL   NASAL TURBINATE REDUCTION Bilateral 06/12/2019   Procedure: TURBINATE REDUCTION/SUBMUCOSAL RESECTION;  Surgeon: [name], MD;  Location: [facility];  Service: ENT;  Laterality: Bilateral;   REPLACEMENT TOTAL HIP W/  RESURFACING IMPLANTS Right 12/2017   [facility]   ROTATOR CUFF REPAIR     Dr. [name] in 04/2019    SCLEROTHERAPY     veins in lower legs   SEPTOPLASTY Bilateral 06/12/2019   Procedure: SEPTOPLASTY;  Surgeon: [name], MD;  Location: [facility];  Service: ENT;  Laterality: Bilateral;   SHOULDER SURGERY Right 12/2015   [facility] torn rotator cuff   SPINAL FUSION  2016   [facility] lumbar    Family Psychiatric History: I have reviewed family psychiatric history from my progress note on 04/30/2018.  Family History:  Family History  Problem Relation Age of Onset   Breast cancer Maternal Aunt    Breast cancer Paternal Aunt    Dementia Paternal Aunt    COPD Mother        quit smoking after 93 years   Arthritis Mother    Osteoarthritis Mother    Dementia Father        symptoms started in his 36s   Alzheimer's disease Father    Rheum arthritis Father    Rheum arthritis Other        FH   Dementia Other        "all my dad's side of the family"   Other Maternal Grandmother        got dementia as she got older    Dementia Paternal Grandmother     Social History: Reviewed social history from my progress note on 04/30/2018. Social History   Socioeconomic History   Marital status: Married    Spouse name: fredrick   Number of children: 1   Years of education: Not on file   Highest education level: High school graduate  Occupational History    Comment: full time  Tobacco Use   Smoking status: Never  Smoker   Smokeless tobacco: Never Used  Vaping Use   Vaping Use: Never used  Substance and Sexual Activity   Alcohol use: Yes    Alcohol/week: 1.0 standard drink    Types: 1 Cans of beer per week    Comment: may have drink 1x/month   Drug use: Never   Sexual activity: Yes  Other Topics Concern   Not on file  Social History Narrative   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    Married 1 child    Lives at home with her husband   Right handed   Caffeine: maybe 1 cup/day (pepsi)   Social Determinants of Health   Financial Resource Strain: Not on file  Food Insecurity: Not on  file  Transportation Needs: Not on file  Physical Activity: Not on file  Stress: Not on file  Social Connections: Not on file    Allergies:  Allergies  Allergen Reactions   Amoxicillin Hives   Septra [Sulfamethoxazole-Trimethoprim] Hives        Cephalexin Rash         Metabolic Disorder Labs: Lab Results  Component Value Date   HGBA1C 5.5 09/25/2019   No results found for: PROLACTIN Lab Results  Component Value Date   CHOL 217 (H) 09/25/2019   TRIG 81.0 09/25/2019   HDL 69.10 09/25/2019   CHOLHDL 3 09/25/2019   VLDL 16.2 09/25/2019   LDLCALC 132 (H) 09/25/2019   LDLCALC 156 (H) 01/05/2019   Lab Results  Component Value Date   TSH 3.78 01/05/2019    Therapeutic Level Labs: No results found for: LITHIUM No results found for: VALPROATE No components found for:  CBMZ  Current Medications: Current Outpatient Medications  Medication Sig Dispense Refill   dexlansoprazole (DEXILANT) 60 MG capsule Take 60 mg by mouth daily.     baclofen (LIORESAL) 10 MG tablet Take 10 mg by mouth 3 (three) times daily as needed for muscle spasms.     diclofenac Sodium (VOLTAREN) 1 % GEL Apply 2-4 g topically 4 (four) times daily. 2 gram upper body qid prn and 4 gram lower body qid prn 100 g 11   DULoxetine (CYMBALTA) 60 MG capsule TAKE 1 CAPSULE BY MOUTH EVERY DAY (Patient not  taking: No sig reported) 90 capsule 1   Elastic Bandages & Supports (RELIEF KNEE) MISC Apply in the morning and remove at night.     estradiol (ESTRACE) 0.1 MG/GM vaginal cream INSERT PEA SIZED AMOUNT (0.5G) INTO VAGINA NIGHTLY EVERY OTHER NIGHT FOR 2 WEEKS, THEN TWICE WEEKLY     famotidine (PEPCID) 20 MG tablet Take 1 tablet (20 mg total) by mouth at bedtime. 90 tablet 0   fluticasone (FLONASE) 50 MCG/ACT nasal spray Place 1-2 sprays into both nostrils daily as needed. Max dose 2 sprays each nostril 16 g 12   linaclotide (LINZESS) 145 MCG CAPS capsule Take 1 capsule (145 mcg total) by mouth daily before breakfast. 30 capsule 0   Melatonin 3 MG CAPS Take by mouth.     methocarbamol (ROBAXIN) 500 MG tablet Take 2 tablets (1,000 mg total) by mouth 2 (two) times daily as needed for muscle spasms. (Patient not taking: No sig reported) 60 tablet 2   ondansetron (ZOFRAN ODT) 8 MG disintegrating tablet Take 1 tablet (8 mg total) by mouth every 8 (eight) hours as needed for nausea or vomiting. 40 tablet 0   oxyCODONE-acetaminophen (PERCOCET) 10-325 MG tablet Take 1 tablet by mouth every 6 (six) hours as needed.     phentermine (ADIPEX-P) 37.5 MG tablet Take 1 tablet (37.5 mg total) by mouth daily before breakfast. rx 1/2 60 tablet 0   pregabalin (LYRICA) 50 MG capsule TAKE 1 TO 2 CAPSULES BY MOUTH EVERY 12 HOURS AS NEEDED     traZODone (DESYREL) 100 MG tablet Take 2 tablets (200 mg total) by mouth at bedtime as needed for sleep. 180 tablet 1   valACYclovir (VALTREX) 1000 MG tablet as needed. Oral blister     No current facility-administered medications for this visit.     Musculoskeletal: Strength & Muscle Tone: UTA Gait & Station: UTA Patient leans: N/A  Psychiatric Specialty Exam: Review of Systems  Psychiatric/Behavioral: The patient is nervous/anxious.   All other systems reviewed and are  negative.   There were no vitals taken for this visit.There is no height or weight on file  to calculate BMI.  General Appearance: Casual  Eye Contact:  Fair  Speech:  Clear and Coherent  Volume:  Normal  Mood:  AnxiousCoping well  Affect:  Congruent  Thought Process:  Goal Directed and Descriptions of Associations: Intact  Orientation:  Full (Time, Place, and Person)  Thought Content: Logical   Suicidal Thoughts:  No  Homicidal Thoughts:  No  Memory:  Immediate;   Fair Recent;   Fair Remote;   Fair  Judgement:  Fair  Insight:  Fair  Psychomotor Activity:  Normal  Concentration:  Concentration: Fair and Attention Span: Fair  Recall:  [employer] of Knowledge: Fair  Language: Fair  Akathisia:  No  Handed:  Right  AIMS (if indicated): UTA  Assets:  Communication Skills Desire for Improvement Housing Social Support  ADL's:  Intact  Cognition: WNL  Sleep:  Fair   Screenings: GAD-7   Flowsheet Row Video Visit from 10/14/2019 in [facility]  Total GAD-7 Score 1    [place] Visit from 09/23/2019 in [facility]  Total Score (max 30 points ) 27    PHQ2-9   Flowsheet Row Video Visit from 10/14/2019 in [facility] Office Visit from 08/14/2019 in [facility] Office Visit from 07/07/2019 in [facility] Office Visit from 03/13/2019 in [facility] from 02/11/2018 in [facility]  PHQ-2 Total Score 0 0 0 0 0  PHQ-9 Total Score -- 0 -- 0 --       Assessment and Plan: Maureen Randall is a 55 year old Caucasian female, married, currently on SSD, lives in [place] has a history of GAD, insomnia, chronic pain, osteoarthritis, degenerative disc disease, lumbar radiculopathy, bilateral carpal tunnel syndrome, multiple surgery including recent left foot surgery was evaluated by telemedicine today.  Patient is currently making progress on the current medication regimen although she continues  to struggle with pain.  Plan as noted below.  Plan GAD-stable Cymbalta 60 mg p.o. daily Hydroxyzine 25-50 mg p.o. daily as needed for severe anxiety attacks Continue CBT as needed  Insomnia-restless Trazodone 150 to 200 mg p.o. nightly as needed Melatonin 3 mg p.o. nightly  Patient will continue to follow-up with her pain provider for pain management.  Follow-up in clinic in 6 weeks or sooner if needed.  I have spent atleast 20 minutes face to face by video with patient today. More than 50 % of the time was spent for preparing to see the patient ( e.g., review of test, records ),  ordering medications and test ,psychoeducation and supportive psychotherapy and care coordination,as well as documenting clinical information in electronic health record. This note was generated in part or whole with voice recognition software. Voice recognition is usually quite accurate but there are transcription errors that can and very often do occur. I apologize for any typographical errors that were not detected and corrected.        Ursula Alert, MD 08/11/2020, 8:44 AM

## 2020-08-12 ENCOUNTER — Other Ambulatory Visit: Payer: Self-pay

## 2020-08-12 MED ORDER — DEXLANSOPRAZOLE 60 MG PO CPDR: 60.0000 mg | DELAYED_RELEASE_CAPSULE | Freq: Every day | ORAL | 6 refills | Status: DC

## 2020-08-16 ENCOUNTER — Ambulatory Visit
Admission: RE | Admit: 2020-08-16 | Discharge: 2020-08-16 | Disposition: A | Discharge status: Home / Self Care | Payer: 59 | Source: Ambulatory Visit | Attending: Obstetrics and Gynecology | Admitting: Obstetrics and Gynecology

## 2020-08-16 ENCOUNTER — Other Ambulatory Visit: Payer: Self-pay

## 2020-08-16 DIAGNOSIS — Z1231 Encounter for screening mammogram for malignant neoplasm of breast: Secondary | ICD-10-CM | POA: Diagnosis not present

## 2020-08-16 IMAGING — MG MM DIGITAL SCREENING BILAT W/ TOMO AND CAD
8 series · 8 of 24 positions shown · non-contrast
Comparison: Previous exam(s).

CLINICAL DATA: Screening.

EXAM:
DIGITAL SCREENING BILATERAL MAMMOGRAM WITH TOMOSYNTHESIS AND CAD
TECHNIQUE: Bilateral screening digital craniocaudal and mediolateral oblique
mammograms were obtained. Bilateral screening digital breast
tomosynthesis was performed. The images were evaluated with
computer-aided detection.

[L CC synth-2D]
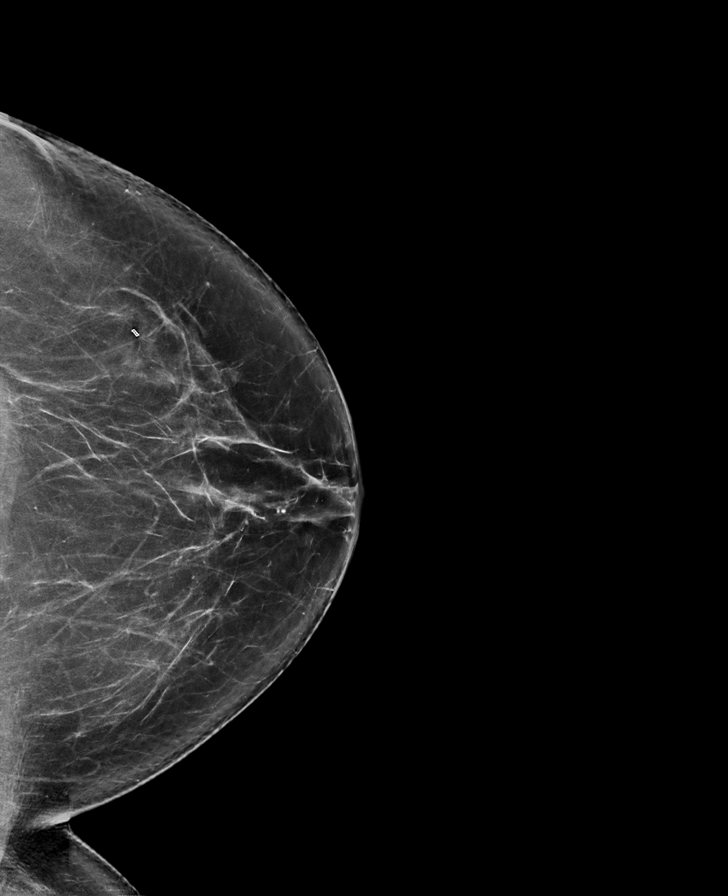

[R CC synth-2D]
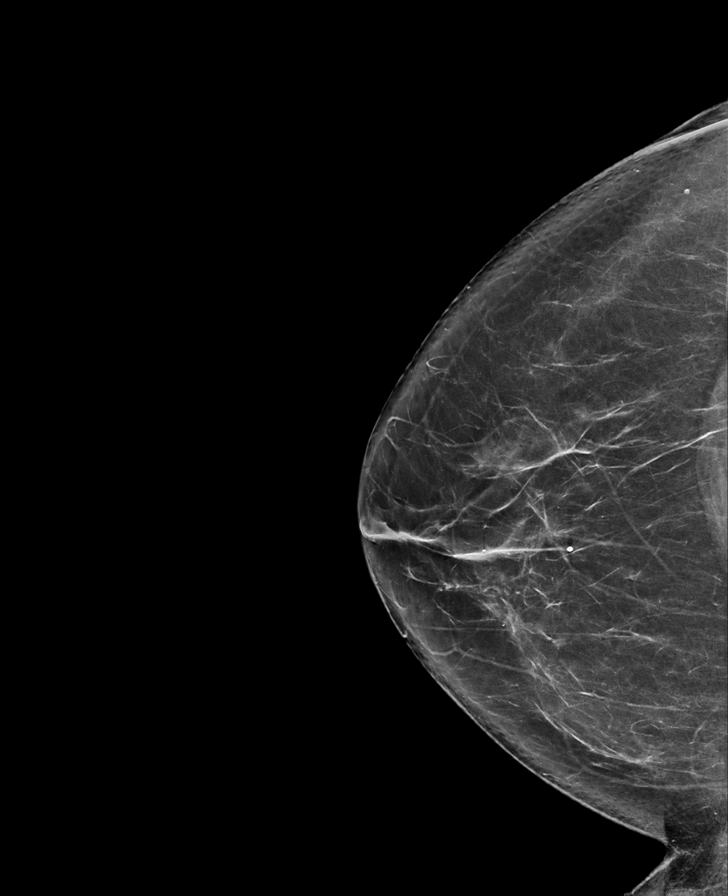

[R MLO synth-2D]
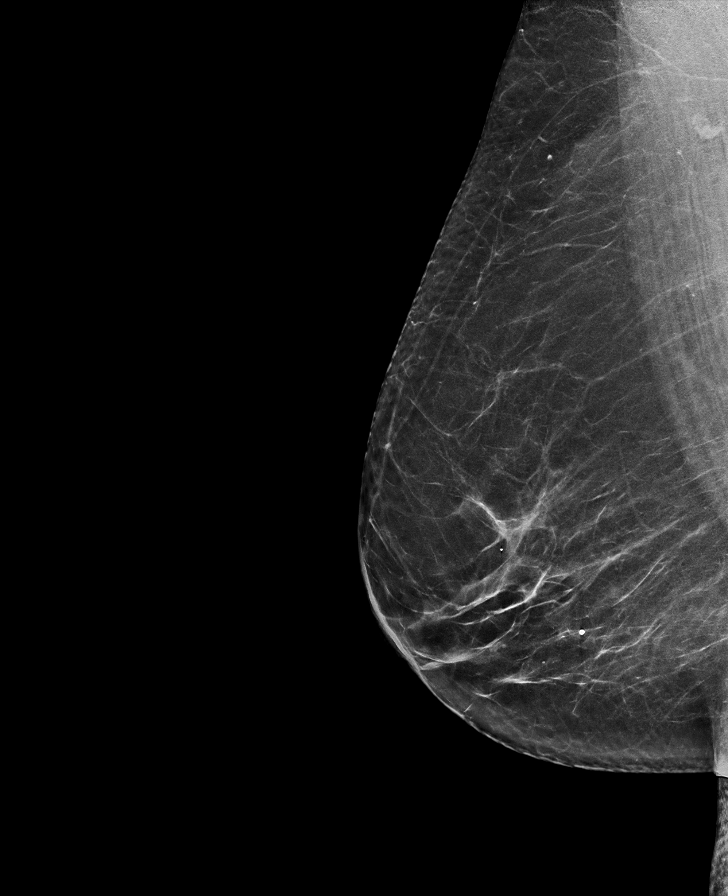

[L MLO synth-2D]
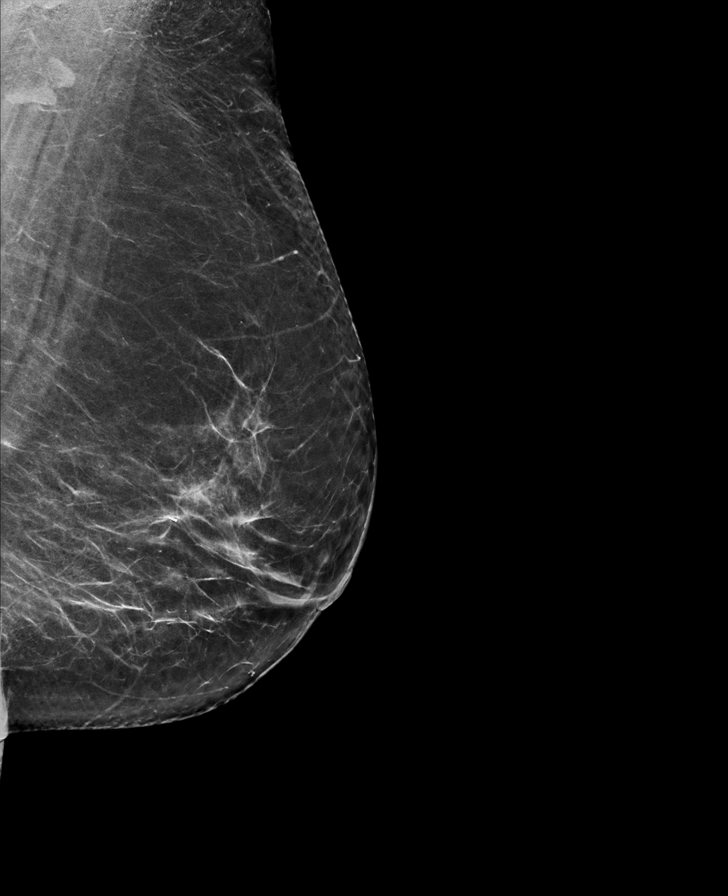

[L MLO tomo · tomo slice 37/74.0]
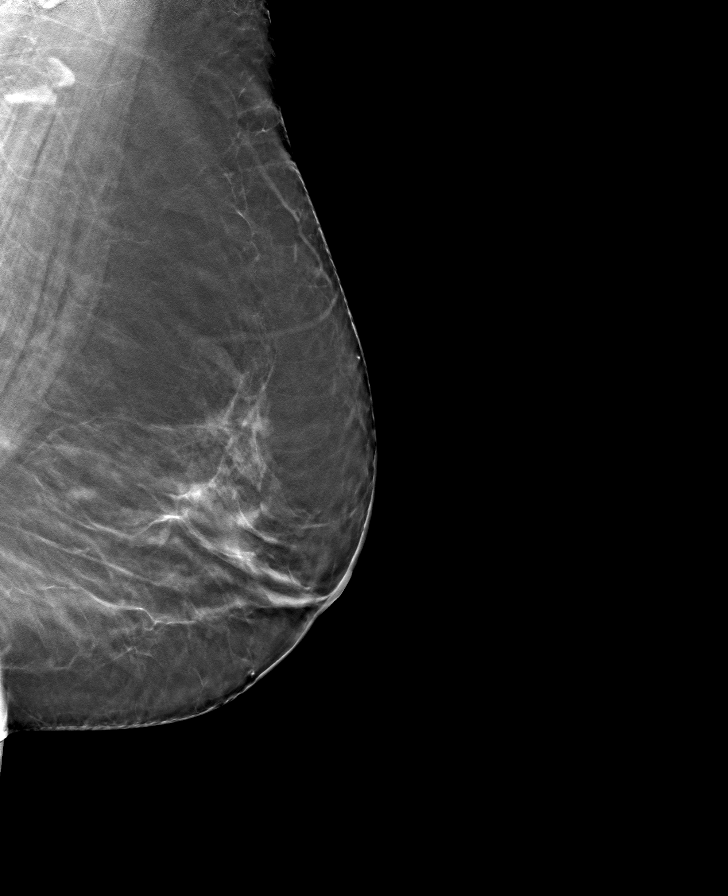

[R CC tomo · tomo slice 39/78.0]
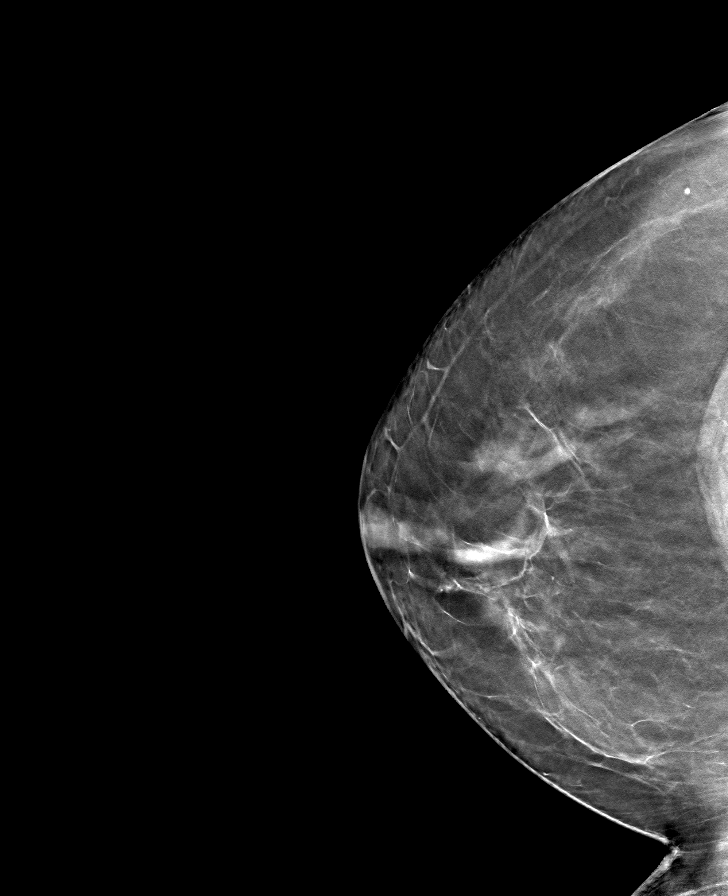

[R MLO tomo · tomo slice 36/71.0]
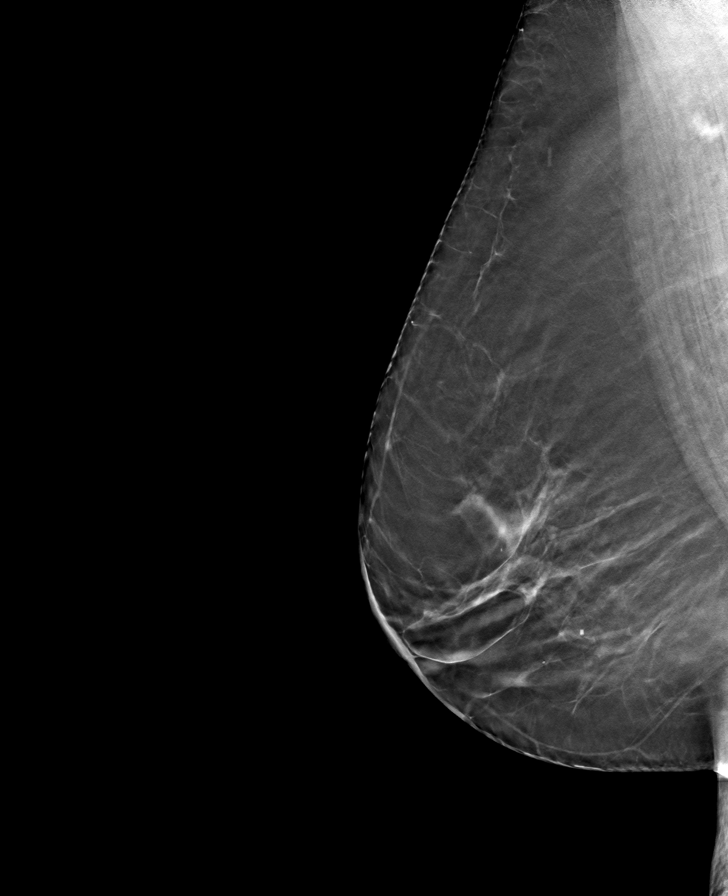

[L CC tomo · tomo slice 39/78.0]
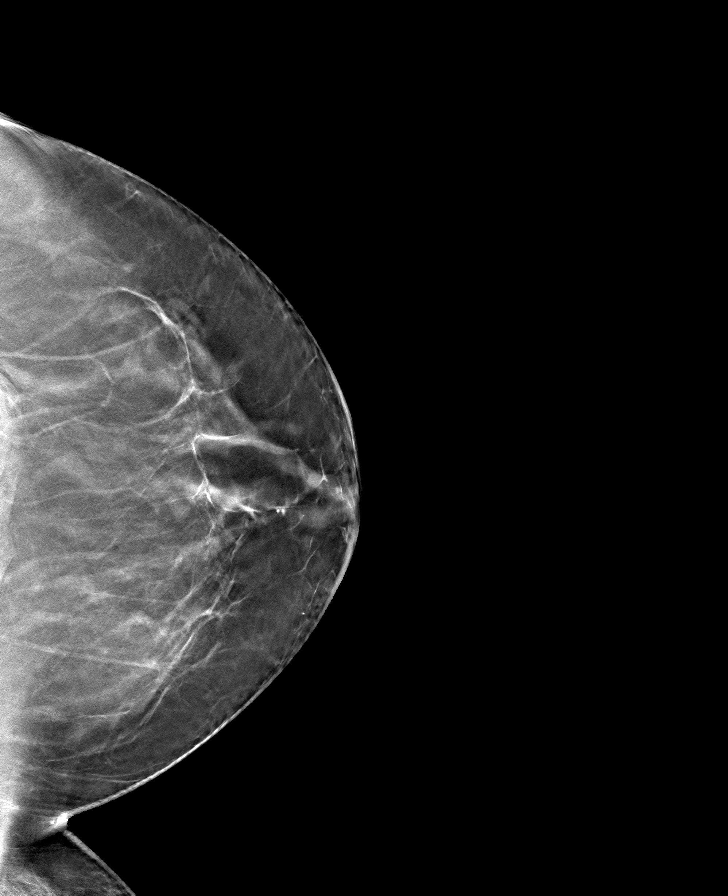

[8 of 24 positions shown; findings below may reference images not displayed]

ACR Breast Density Category b: There are scattered areas of
fibroglandular density.
FINDINGS: There are no findings suspicious for malignancy.
IMPRESSION: No mammographic evidence of malignancy. A result letter of this
screening mammogram will be mailed directly to the patient.

RECOMMENDATION:
Screening mammogram in one year. (Code:[BY])

BI-RADS CATEGORY  1: Negative.

## 2020-08-17 ENCOUNTER — Ambulatory Visit: Payer: 59

## 2020-08-17 ENCOUNTER — Ambulatory Visit (INDEPENDENT_AMBULATORY_CARE_PROVIDER_SITE_OTHER): Payer: 59 | Admitting: Internal Medicine

## 2020-08-17 ENCOUNTER — Encounter: Payer: Self-pay | Admitting: Internal Medicine

## 2020-08-17 VITALS — BP 122/78 | HR 83 | Temp 98.2°F | Ht 67.0 in | Wt 201.4 lb

## 2020-08-17 DIAGNOSIS — F419 Anxiety disorder, unspecified: Secondary | ICD-10-CM

## 2020-08-17 DIAGNOSIS — R200 Anesthesia of skin: Secondary | ICD-10-CM | POA: Diagnosis not present

## 2020-08-17 DIAGNOSIS — M7071 Other bursitis of hip, right hip: Secondary | ICD-10-CM

## 2020-08-17 DIAGNOSIS — R29898 Other symptoms and signs involving the musculoskeletal system: Secondary | ICD-10-CM | POA: Diagnosis not present

## 2020-08-17 DIAGNOSIS — Z23 Encounter for immunization: Secondary | ICD-10-CM | POA: Diagnosis not present

## 2020-08-17 DIAGNOSIS — R202 Paresthesia of skin: Secondary | ICD-10-CM

## 2020-08-17 DIAGNOSIS — M5416 Radiculopathy, lumbar region: Secondary | ICD-10-CM | POA: Diagnosis not present

## 2020-08-17 DIAGNOSIS — M7072 Other bursitis of hip, left hip: Secondary | ICD-10-CM

## 2020-08-17 DIAGNOSIS — R296 Repeated falls: Secondary | ICD-10-CM

## 2020-08-17 NOTE — Progress Notes (Signed)
Chief Complaint  Patient presents with  . Referral   F/u  1. Chronic pain in hips R>L and low back s/p 2 back surgeries 1 at least with Dr. Patrice Paradise who dismissed the patient and states there is no further w/u but she is in pain daily b/l hips R>L and lower back 6/10. Prior PT, radiofrequency ablation did not provide help and steroid injections in the past temporarily helped but not long term. Pain worse since 08/01/20 and she is having L>R leg weakness, numbness and tingling b/l legs and falls and b/l groin pain. H/o b/l bursitis. Left leg weakness and gives out she thought was after exertion but also it happens in the am. She is on long term disability with her job which ends end of 08/2020. Sitting 30-40 minutes makes pain worse. External rotation and flexion cause pain, cramping and muscle spasm appt with Dr. Karie Schwalbe Gottried [facility] 09/08/20 [facility] and   -->we discussed consider EMG/NCS with NS in the future Right hip:  This is evidence of a small lucency behind the acetbular component,  otherwise the components look well positioned without evidence of  Loosening   Hip Imaging (tj): AP PELVIS, AP HIP, LAT HIP, GROIN LAT  Right hip:  This is evidence of a small lucency behind the acetbular component,  otherwise the components look well positioned without evidence of  loosening  Exam End: -  Specimen Collected: 02/13/19 9:08 AM Last Resulted: 02/17/19 3:25 PM  Received From: [facility]  CT lumbar 04/08/20 CLINICAL DATA:  55 year old female with severe back pain radiating down the right leg, involving the sacrum. Prior surgery.  EXAM: CT LUMBAR SPINE WITHOUT CONTRAST  TECHNIQUE: Multidetector CT imaging of the lumbar spine was performed without intravenous contrast administration. Multiplanar CT image reconstructions were also generated.  COMPARISON:  [facility] Lumbar radiographs 09/29/2019. Intraoperative  radiographs 09/28/2019.  FINDINGS: Segmentation: Normal.  Alignment: Preserved lumbar lordosis.  No spondylolisthesis.  Vertebrae: Postoperative details are below. No acute osseous abnormality identified. Visible sacrum and SI joints appear intact.  Paraspinal and other soft tissues: Low-density 2.5 cm left adrenal nodule consistent with benign adenoma (series 4, image 6). Negative visible other noncontrast abdominal and pelvic viscera. Postoperative changes to the posterior paraspinal soft tissues with no adverse features.  Disc levels: T11-T12: Mild facet hypertrophy on the right.  T12-L1:  Negative.  L1-L2:  Negative.  L2-L3: Minor disc bulging. Mild to moderate facet hypertrophy greater on the left. Mild vacuum facet on the left. No significant stenosis.  L3-L4: Circumferential disc bulge. Mild to moderate posterior element hypertrophy. Small focus of gas along the left posterolateral thecal sac, probably a small synovial cyst on that side (series 4, image 67). But no convincing right side neural impingement.  L4-L5: Sequelae of decompression and fusion. Pedicle screws and interbody implant appear intact. Somewhat lateral position of the left L4 screw (series 5, image 73). Solid appearing right side posterior element arthrodesis. Scant interbody arthrodesis. No evidence of stenosis.  L5-S1: Anterior interbody fusion hardware. Mild lucency along the S1 screws (series 5, images 108 and 109). But there is evidence of developing interbody calcification. Mild to moderate facet hypertrophy greater on the right. Mild epidural lipomatosis. No stenosis.  IMPRESSION: 1. Sequelae of decompression and fusion at L4-L5 with solid appearing right side posterior element arthrodesis. No evidence of recurrent stenosis.  2. Sequelae of anterior interbody fusion at L5-S1 with questionable loosening of the S1 screws. But there is  interbody calcification although no  convincing solid arthrodesis at this time.  3. No acute osseous abnormality in the lumbar spine.  4. Adjacent segment disease at L3-L4 with facet arthropathy but no convincing spinal stenosis or right side neural impingement.  5. Benign left adrenal adenoma, does not require follow-up.   Electronically Signed   By: Genevie Ann M.D.   On: 04/08/2020 20:21   Review of Systems  Musculoskeletal: Positive for back pain, falls and joint pain.  Neurological: Positive for sensory change and weakness.   Past Medical History:  Diagnosis Date  . Anxiety   . Arthritis    osteoarthritis-joints, feet, shoulder  . Asthma    previously diagnosed after a bout of bronchitis/ no problems since  . Chronic pain syndrome   . COPD (chronic obstructive pulmonary disease) ([place]) 2004   patient states probably bronchitis  . Depression   . DVT (deep venous thrombosis) ([place]) 2012   right leg. after surgery, while on BCP  . GERD (gastroesophageal reflux disease)   . Hyperlipidemia   . Neuropathy    post surgery  . PVD (peripheral vascular disease) ([place])   . Spondylolisthesis    Past Surgical History:  Procedure Laterality Date  . ABLATION     veins in lower legs  . BACK SURGERY  07/2014   lumbar spine/ fusion 1 level  . BREAST BIOPSY Left 2011   neg  . CARPAL TUNNEL RELEASE Right 12/2015   [facility]  . CARPAL TUNNEL RELEASE Left 12/2016  . COLONOSCOPY WITH PROPOFOL N/A 02/29/2020   Procedure: COLONOSCOPY WITH PROPOFOL;  Surgeon: [name], MD;  Location: [facility];  Service: Endoscopy;  Laterality: N/A;  . ESOPHAGOGASTRODUODENOSCOPY (EGD) WITH PROPOFOL N/A 02/29/2020   Procedure: ESOPHAGOGASTRODUODENOSCOPY (EGD) WITH PROPOFOL;  Surgeon: [name], MD;  Location: [facility];  Service: Endoscopy;  Laterality: N/A;  . FOOT SURGERY Right 2015   broken navicular bone  . HIP ARTHROSCOPY Right 05/2011  . HIP SURGERY     left hip surgery 05/2018  . JOINT REPLACEMENT  Bilateral 12/2017 & 05/2018   hip  . L5-S1 OLIF Dr. Rennis Harding back surgery 09/2019  09/28/2019  . left ganglion cyst removal     12/23/19 Dr. Elvina Mattes  . MASS EXCISION Left 12/17/2019   Procedure: EXCISION TUMOR FOOT DEEP LEFT;  Surgeon: Albertine Patricia, DPM;  Location: [facility];  Service: Podiatry;  Laterality: Left;  LMA W/LOCAL  . NASAL TURBINATE REDUCTION Bilateral 06/12/2019   Procedure: TURBINATE REDUCTION/SUBMUCOSAL RESECTION;  Surgeon: [name], MD;  Location: [facility];  Service: ENT;  Laterality: Bilateral;  . 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 ROTATOR CUFF REPAIR     Dr. [name] in 04/2019   . SCLEROTHERAPY     veins in lower legs  . SEPTOPLASTY Bilateral 06/12/2019   Procedure: SEPTOPLASTY;  Surgeon: [name], MD;  Location: [place];  Service: ENT;  Laterality: Bilateral;  . SHOULDER SURGERY Right 12/2015   [facility] torn rotator cuff  . SPINAL FUSION  2016   [facility] lumbar   Family History  Problem Relation Age of Onset  . Breast cancer Maternal Aunt   . Breast cancer Paternal Aunt   . Dementia Paternal Aunt   . COPD Mother        quit smoking after 60 years  . Arthritis Mother   . Osteoarthritis Mother   . Dementia Father  symptoms started in his 105s  . Alzheimer's disease Father   . Rheum arthritis Father   . Rheum arthritis Other        FH  . Dementia Other        "all my dad's side of the family"  . Other Maternal Grandmother        got dementia as she got older   . Dementia Paternal Grandmother    Social History   Socioeconomic History  . Marital status: Married    Spouse name: fredrick  . Number of children: 1  . Years of education: Not on file  . Highest education level: High school graduate  Occupational History    Comment: full time  Tobacco Use  . Smoking status: Never Smoker  . Smokeless tobacco: Never Used  Vaping Use  . Vaping Use:  Never used  Substance and Sexual Activity  . Alcohol use: Yes    Alcohol/week: 1.0 standard drink    Types: 1 Cans of beer per week    Comment: may have drink 1x/month  . Drug use: Never  . Sexual activity: Yes  Other Topics Concern  . 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 baclofen (LIORESAL) 10 MG tablet Take 10 mg by mouth 3 (three) times daily as needed for muscle spasms.  [name] dexlansoprazole (DEXILANT) 60 MG capsule Take 1 capsule (60 mg total) by mouth daily.  . diclofenac Sodium (VOLTAREN) 1 % GEL Apply 2-4 g topically 4 (four) times daily. 2 gram upper body qid prn and 4 gram lower body qid prn  . DULoxetine (CYMBALTA) 60 MG capsule TAKE 1 CAPSULE BY MOUTH EVERY DAY  . Elastic Bandages & Supports (RELIEF KNEE) MISC Apply in the morning and remove at night.  . estradiol (ESTRACE) 0.1 MG/GM vaginal cream INSERT PEA SIZED AMOUNT (0.5G) INTO VAGINA NIGHTLY EVERY OTHER NIGHT FOR 2 WEEKS, THEN TWICE WEEKLY  . famotidine (PEPCID) 20 MG tablet Take 1 tablet (20 mg total) by mouth at bedtime.  . fluticasone (FLONASE) 50 MCG/ACT nasal spray Place 1-2 sprays into both nostrils daily as needed. Max dose 2 sprays each nostril  . linaclotide (LINZESS) 145 MCG CAPS capsule Take 1 capsule (145 mcg total) by mouth daily before breakfast.  . Melatonin 3 MG CAPS Take by mouth.  . methocarbamol (ROBAXIN) 500 MG tablet Take 2 tablets (1,000 mg total) by mouth 2 (two) times daily as needed for muscle spasms.  . ondansetron (ZOFRAN ODT) 8 MG  disintegrating tablet Take 1 tablet (8 mg total) by mouth every 8 (eight) hours as needed for nausea or vomiting.  [name] oxyCODONE-acetaminophen (PERCOCET) 10-325 MG tablet Take 1 tablet by mouth every 6 (six) hours as needed.  . phentermine (ADIPEX-P) 37.5 MG tablet Take 1 tablet (37.5 mg total) by mouth daily before breakfast. rx 1/2  . pregabalin (LYRICA) 50 MG capsule TAKE 1 TO 2 CAPSULES BY MOUTH EVERY 12 HOURS AS NEEDED  . traZODone (  DESYREL) 100 MG tablet Take 2 tablets (200 mg total) by mouth at bedtime as needed for sleep.  . valACYclovir (VALTREX) 1000 MG tablet as needed. Oral blister   Allergies  Allergen Reactions  . Amoxicillin Hives  . Septra [Sulfamethoxazole-Trimethoprim] Hives       . Cephalexin Rash        Recent Results (from the past 2160 hour(s))  Lipase, blood     Status: None   Collection Time: 06/19/20 11:35 AM  Result Value Ref Range   Lipase 39 11 - 51 U/L    Comment: Performed at [facility], [address] [place], [place] [address]  Comprehensive metabolic panel     Status: Abnormal   Collection Time: 06/19/20 11:35 AM  Result Value Ref Range   Sodium 136 135 - 145 mmol/L   Potassium 3.7 3.5 - 5.1 mmol/L   Chloride 104 98 - 111 mmol/L   CO2 21 (L) 22 - 32 mmol/L   Glucose, Bld 111 (H) 70 - 99 mg/dL    Comment: Glucose reference range applies only to samples taken after fasting for at least 8 hours.   BUN 24 (H) 6 - 20 mg/dL   Creatinine, Ser 0.69 0.44 - 1.00 mg/dL   Calcium 9.1 8.9 - 10.3 mg/dL   Total Protein 7.3 6.5 - 8.1 g/dL   Albumin 4.1 3.5 - 5.0 g/dL   AST 20 15 - 41 U/L   ALT 19 0 - 44 U/L   Alkaline Phosphatase 68 38 - 126 U/L   Total Bilirubin 0.5 0.3 - 1.2 mg/dL   GFR, Estimated >60 >60 mL/min    Comment: (NOTE) Calculated using the CKD-EPI Creatinine Equation (2021)    Anion gap 11 5 - 15    Comment: Performed at [facility], [address] [place], [place] [address]  CBC     Status: None   Collection Time:  06/19/20 11:35 AM  Result Value Ref Range   WBC 7.8 4.0 - 10.5 K/uL   RBC 4.23 3.87 - 5.11 MIL/uL   Hemoglobin 13.2 12.0 - 15.0 g/dL   HCT 40.4 36.0 - 46.0 %   MCV 95.5 80.0 - 100.0 fL   MCH 31.2 26.0 - 34.0 pg   MCHC 32.7 30.0 - 36.0 g/dL   RDW 12.5 11.5 - 15.5 %   Platelets 343 150 - 400 K/uL   nRBC 0.0 0.0 - 0.2 %    Comment: Performed at [facility] [address] [place], [place] [address]  Urinalysis, Complete w Microscopic Urine, Clean Catch     Status: Abnormal   Collection Time: 06/19/20 11:35 AM  Result Value Ref Range   Color, Urine YELLOW (A) YELLOW   APPearance CLOUDY (A) CLEAR   Specific Gravity, Urine 1.024 1.005 - 1.030   pH 5.0 5.0 - 8.0   Glucose, UA NEGATIVE NEGATIVE mg/dL   Hgb urine dipstick NEGATIVE NEGATIVE   Bilirubin Urine NEGATIVE NEGATIVE   Ketones, ur NEGATIVE NEGATIVE mg/dL   Protein, ur NEGATIVE NEGATIVE mg/dL   Nitrite NEGATIVE NEGATIVE   Leukocytes,Ua NEGATIVE NEGATIVE   RBC / HPF 0-5 0 - 5 RBC/hpf   WBC, UA 0-5 0 - 5 WBC/hpf   Bacteria, UA RARE (A) NONE SEEN   Squamous Epithelial / LPF 21-50 0 - 5   Mucus PRESENT     Comment: Performed at [facility], [address] [place], [place] [address]  HM PAP SMEAR     Status:  None   Collection Time: 06/28/20 12:00 AM  Result Value Ref Range   HM Pap smear normal     Comment: 06/28/20 Dr. Leafy Ro   Objective  Body mass index is 31.54 kg/m. Wt Readings from Last 3 Encounters:  08/17/20 201 lb 6.4 oz (91.4 kg)  08/04/20 202 lb 6.4 oz (91.8 kg)  07/26/20 202 lb (91.6 kg)   Temp Readings from Last 3 Encounters:  08/17/20 98.2 F (36.8 C) (Oral)  06/19/20 98.1 F (36.7 C) (Oral)  02/29/20 (!) 97.5 F (36.4 C)   BP Readings from Last 3 Encounters:  08/17/20 122/78  08/04/20 113/76  07/26/20 128/85   Pulse Readings from Last 3 Encounters:  08/17/20 83  08/04/20 79  06/19/20 77    Physical Exam Vitals and nursing note reviewed.  Constitutional:       Appearance: Normal appearance. She is well-developed and well-groomed. She is obese.  HENT:     Head: Normocephalic and atraumatic.  Eyes:     Conjunctiva/sclera: Conjunctivae normal.     Pupils: Pupils are equal, round, and reactive to light.  Cardiovascular:     Rate and Rhythm: Normal rate and regular rhythm.     Heart sounds: Normal heart sounds. No murmur heard.   Pulmonary:     Effort: Pulmonary effort is normal.     Breath sounds: Normal breath sounds.  Musculoskeletal:     Lumbar back: Tenderness present. Positive right straight leg raise test and positive left straight leg raise test.     Right hip: Tenderness and bony tenderness present. Decreased range of motion.     Left hip: Tenderness and bony tenderness present. Decreased range of motion.  Skin:    General: Skin is warm and dry.  Neurological:     General: No focal deficit present.     Mental Status: She is alert and oriented to person, place, and time. Mental status is at baseline.     Gait: Gait normal.  Psychiatric:        Attention and Perception: Attention and perception normal.        Mood and Affect: Mood and affect normal.        Speech: Speech normal.        Behavior: Behavior normal. Behavior is cooperative.        Thought Content: Thought content normal.        Cognition and Memory: Cognition and memory normal.        Judgment: Judgment normal.     Assessment  Plan  Lumbar radiculopathy and b/l hip pain  Low back pain, prior surgery, new symptoms; chronic back pain s/p back surgery x 2 fusion L4/L5-L5/S1;lumbar radiculopathy, groin pain, b/l leg weakness L>R, right hip pain, falls numbness/tingling  Dx: Lumbar radiculopathy [M54.16 (ICD-10-CM)]; Weakness of both lower extremities [R29.898 (ICD-10-CM)]; Bursitis of right hip, unspecified bursa [M70.71 (ICD-10-CM)]; Numbness and tingling [R20.0, R20.2 (ICD-10-CM)]; Bursitis of left hip, unspecified bursa [M70.72 (ICD-10-CM)]; Recurrent falls [R29.6  (ICD-10-CM)]    Plan: MR Lumbar Spine Wo Contrast Weakness of both lower extremities - Plan: MR Lumbar Spine Wo Contrast  Bursitis of right hip, unspecified bursa - Plan: MR HIP RIGHT WO CONTRAST, MR Lumbar Spine Wo Contrast  Numbness and tingling - Plan: MR Lumbar Spine Wo Contrast -consider NCS/EMG b/l legs   Bursitis of left hip, unspecified bursa - Plan: MR HIP LEFT WO CONTRAST, MR Lumbar Spine Wo Contrast  Recurrent falls - Plan: MR HIP LEFT WO CONTRAST, MR HIP RIGHT  WO CONTRAST, MR Lumbar Spine Wo Contrast  -->appt Dr. Karie Schwalbe Gottried 09/08/20 [facility] for 2nd opinion  -we disc consider b/l NCS/EMG  HM Declines flu shot  Rapids 3/3 shingrix 1/2 given today 2nd dose at f/u  Consider hep B vaccine in future NR 08/31/15 -disc new Hep B vaccine in the futurex 2 doses   HCV neg 08/31/15  Pap neg 09/11/16 neg neg HPV Will f/u Dr. Leafy Ro has IUDchecked 06/28/20 and pap neg neg HPV  Mammogram 08/12/19 ob/gyn ordered Dr. Esaw Grandchild 08/16/20 and negative   dexa6/28/21 normal  Colonoscopy utd had 02/2020 f/u in 10 years with EGD Dr. Allen Norris   Dermatology was due 09/2018 f/u usu. Yearly [facility] Barnetta Chapel Per pt seen in 2021 and negative no bxs or ln2  Never smoker   Other providers Former PCP [facility] Dr. Garen Grams then [facility] requested all imaging Xrays, MRIS, CT Pain Dr. Alvira Monday in [place] specialist Dr. Levada Schilling spine in [place] [facility]-Dr. Tami Ribas stated pt did not have allergies of note had testing Psychiatry and therapy-following  PT [facility] Dr. [place] Pita, Dr. Lonia Blood [facility]  Ortho spine Dr. Rennis Harding  Ortho Dr .[name] [facility] in [place]   Of note meds pt gets from PCP  Valtrex  adipex  Macrobid  Robaxin  voltaren gel  04/14/19 ENT Dr. Tami Ribas sinusitis deviated nasal septum right nasal endoscopy and left nasacort 55 nasal hoarse voice small hemorrhage right  cord 2/2 coughing and inhalers f/u in 6 weeks after shoulder surgery   Provider: Dr. Olivia Mackie McLean-Scocuzza-Internal Medicine

## 2020-08-17 NOTE — Patient Instructions (Addendum)
Consider nerve conduction study/EMG of both lower legs (leg weakness, numbness/tingling)     [employer] Disability Physical [phone]

## 2020-08-21 ENCOUNTER — Encounter: Payer: Self-pay | Admitting: Internal Medicine

## 2020-08-22 MED ORDER — DIAZEPAM 5 MG PO TABS: 5.0000 mg | ORAL_TABLET | ORAL | 0 refills | Status: DC | PRN

## 2020-08-22 NOTE — Addendum Note (Signed)
Addended by: Orland Mustard on: 08/22/2020 11:18 AM   Modules accepted: Orders

## 2020-08-29 ENCOUNTER — Ambulatory Visit: Payer: 59

## 2020-08-29 ENCOUNTER — Other Ambulatory Visit: Payer: 59

## 2020-09-05 ENCOUNTER — Other Ambulatory Visit: Payer: Self-pay

## 2020-09-05 ENCOUNTER — Encounter: Payer: Self-pay | Admitting: Internal Medicine

## 2020-09-05 ENCOUNTER — Ambulatory Visit
Admission: RE | Admit: 2020-09-05 | Discharge: 2020-09-05 | Disposition: A | Discharge status: Home / Self Care | Payer: 59 | Source: Ambulatory Visit | Attending: Internal Medicine | Admitting: Internal Medicine

## 2020-09-05 ENCOUNTER — Ambulatory Visit: Payer: 59

## 2020-09-05 ENCOUNTER — Other Ambulatory Visit: Payer: 59

## 2020-09-05 DIAGNOSIS — M7071 Other bursitis of hip, right hip: Secondary | ICD-10-CM | POA: Diagnosis present

## 2020-09-05 DIAGNOSIS — M5416 Radiculopathy, lumbar region: Secondary | ICD-10-CM | POA: Diagnosis present

## 2020-09-05 DIAGNOSIS — R29898 Other symptoms and signs involving the musculoskeletal system: Secondary | ICD-10-CM | POA: Diagnosis present

## 2020-09-05 DIAGNOSIS — K219 Gastro-esophageal reflux disease without esophagitis: Secondary | ICD-10-CM

## 2020-09-05 DIAGNOSIS — R296 Repeated falls: Secondary | ICD-10-CM

## 2020-09-05 DIAGNOSIS — M7072 Other bursitis of hip, left hip: Secondary | ICD-10-CM | POA: Diagnosis present

## 2020-09-05 DIAGNOSIS — R202 Paresthesia of skin: Secondary | ICD-10-CM | POA: Insufficient documentation

## 2020-09-05 DIAGNOSIS — R200 Anesthesia of skin: Secondary | ICD-10-CM | POA: Diagnosis present

## 2020-09-05 IMAGING — MR MR HIP*R* W/O CM
7 series · 40 of 40 positions shown · non-contrast
Comparison: CT abdomen and pelvis [DATE].

CLINICAL DATA: Low back pain radiating into both hips. History of
bilateral hip replacement.

EXAM:
MR OF THE BILATERAL HIPS WITHOUT CONTRAST
TECHNIQUE: Multiplanar, multisequence MR imaging was performed. No intravenous
contrast was administered.

[Series 4: T1 · coronal · B · 4.0mm · 1.19mm/px · 5 of 40 slices shown]
[im 1/40]
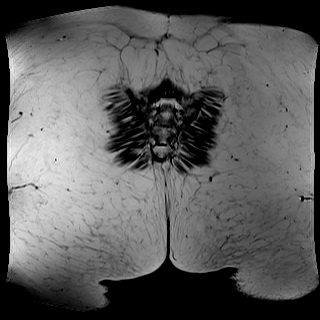
[im 10/40]
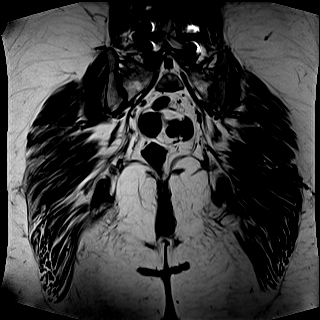
[im 20/40]
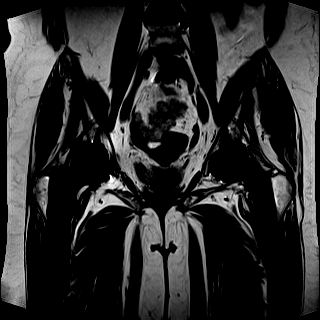
[im 30/40]
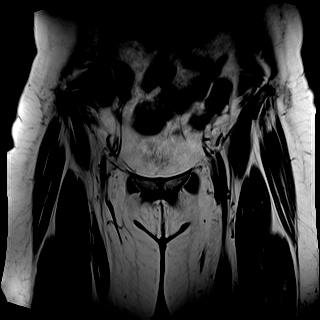
[im 40/40]
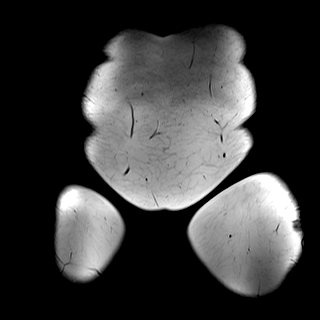

[Series 5: STIR · coronal · B · 4.0mm · 1.56mm/px · 6 of 40 slices shown (1 of 3)]
[im 1/40]
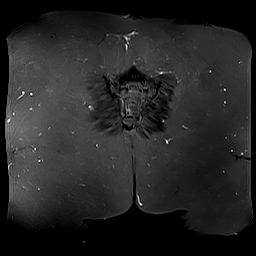
[im 8/40]
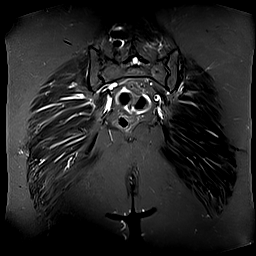
[im 16/40]
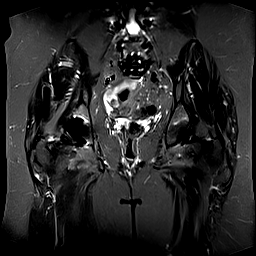
[im 24/40]
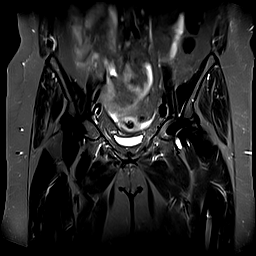
[im 32/40]
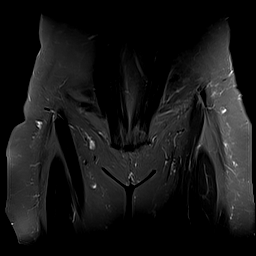
[im 40/40]
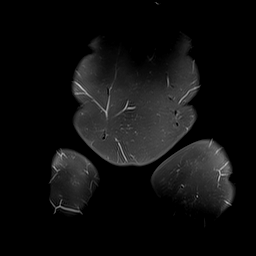

[Series 6: STIR · axial · B · 4.0mm · 1.17mm/px · z∈[-130,+70]mm · 7 of 41 slices shown (2 of 3)]
[im 1/41]
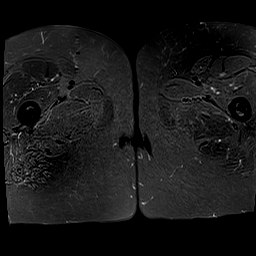
[im 7/41]
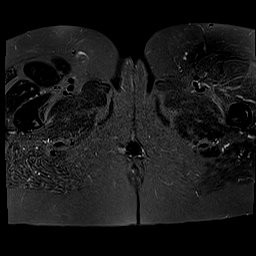
[im 14/41]
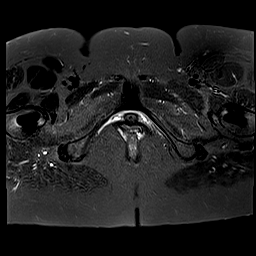
[im 21/41]
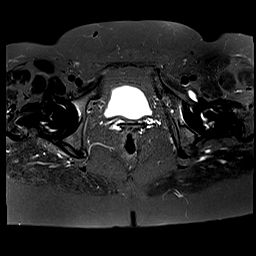
[im 27/41]
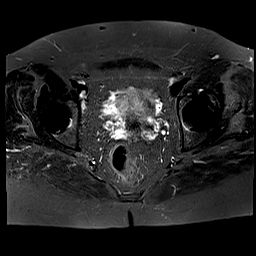
[im 34/41]
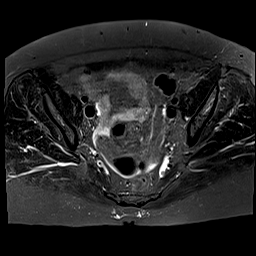
[im 41/41]
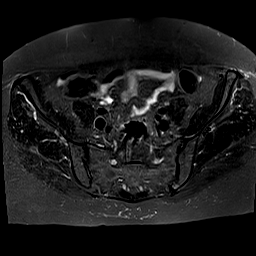

[Series 7: PD fat-sat · sagittal · B · 4.0mm · 0.76mm/px · 5 of 29 slices shown (1 of 3)]
[im 1/29]
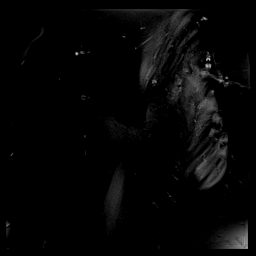
[im 8/29]
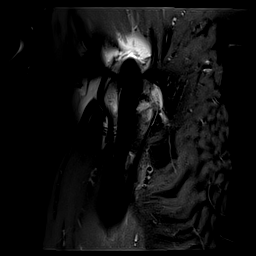
[im 15/29]
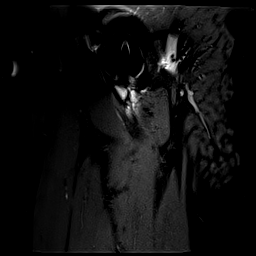
[im 22/29]
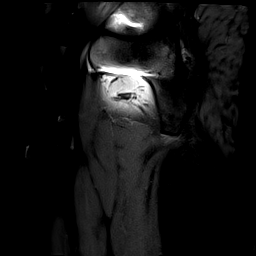
[im 29/29]
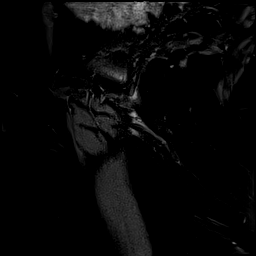

[Series 8: PD fat-sat · coronal · B · 4.0mm · 0.78mm/px · 5 of 31 slices shown (2 of 3)]
[im 1/31]
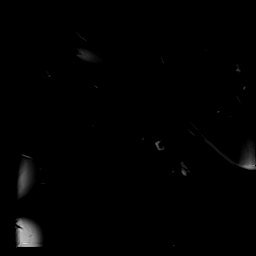
[im 8/31]
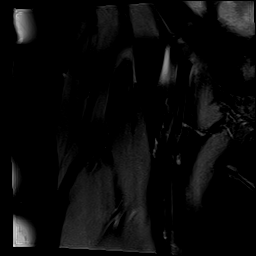
[im 16/31]
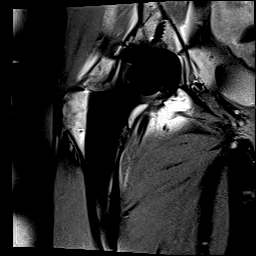
[im 23/31]
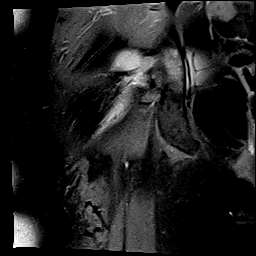
[im 31/31]
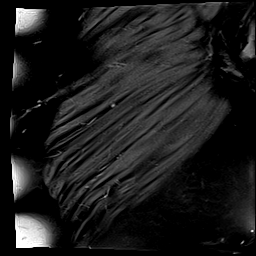

[Series 9: PD fat-sat · sagittal · B · 4.0mm · 0.86mm/px · 5 of 29 slices shown (3 of 3)]
[im 1/29]
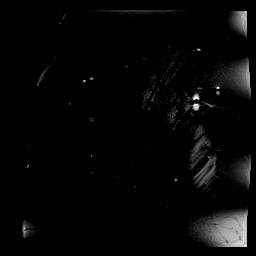
[im 8/29]
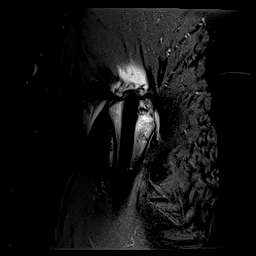
[im 15/29]
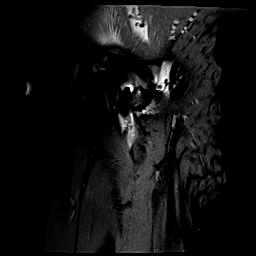
[im 22/29]
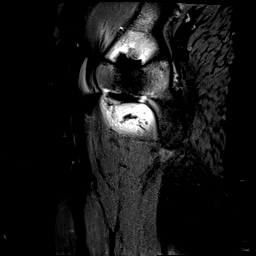
[im 29/29]
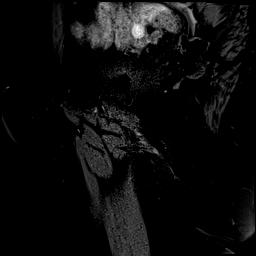

[Series 10: STIR · axial · B · 4.0mm · 1.37mm/px · z∈[-129,+71]mm · 7 of 41 slices shown (3 of 3)]
[im 1/41]
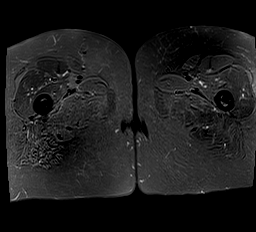
[im 7/41]
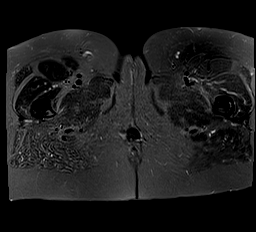
[im 14/41]
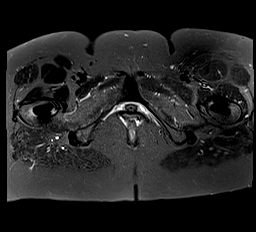
[im 21/41]
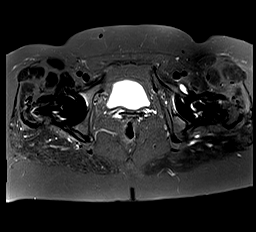
[im 27/41]
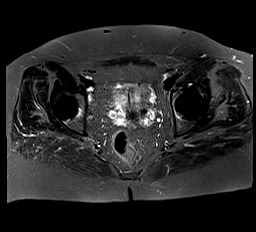
[im 34/41]
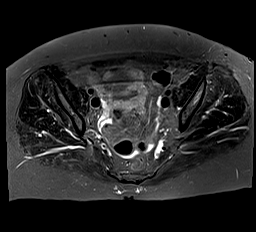
[im 41/41]
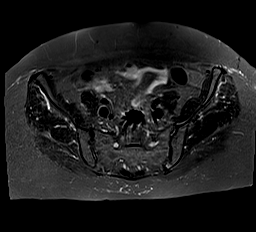

[40 of 40 positions shown; findings below may reference images not displayed]

FINDINGS: Bones/Joint/Cartilage

Artifact reduction techniques were used in this patient with
bilateral total hip arthroplasties. No fracture, stress change or
worrisome lesion is identified. There is no evidence of loosening of
the patient's hip replacements. No bony destructive change or
erosion is seen about either hip. Artifact from lower lumbar fusion
hardware is noted.

Ligaments

Negative.

Muscles and Tendons

There is a very small amount of fluid about the iliopsoas tendons
bilaterally which is greater on the left and compatible with
iliopsoas bursitis. No tendon tear or strain is identified. No
atrophy.

Soft tissues

Imaged intrapelvic contents demonstrate no acute or focal
abnormality.
IMPRESSION: Status post bilateral total hip arthroplasty. No acute abnormality
or complicating feature is identified.

Small volume of fluid about the iliopsoas tendons compatible with
iliopsoas bursitis, greater on the left.

## 2020-09-05 IMAGING — MR MR LUMBAR SPINE W/O CM
5 series · 31 of 48 positions shown · non-contrast
Comparison: [DATE] and prior.

CLINICAL DATA: Low back pain, prior surgery, new symptoms chronic
back pain s/p back surgery x 2 fusion L4/L5-L5/S1;lumbar
radiculopathy, groin pain, b/l leg weakness L>R, right hip pain,
falls numbness/tingling

EXAM:
MRI LUMBAR SPINE WITHOUT CONTRAST
TECHNIQUE: Multiplanar, multisequence MR imaging of the lumbar spine was
performed. No intravenous contrast was administered.

[Series 5: T2 · sagittal · 4.0mm · 0.81mm/px · 7 of 17 slices shown (1 of 2)]
[im 1/17]
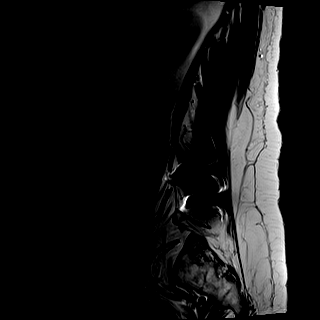
[im 3/17]
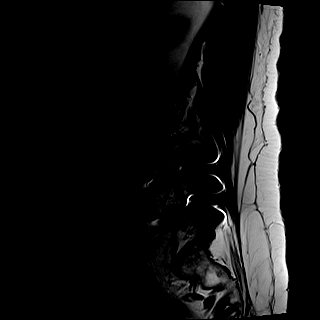
[im 6/17]
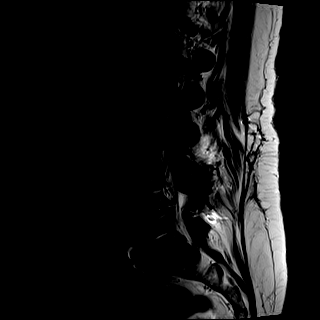
[im 9/17]
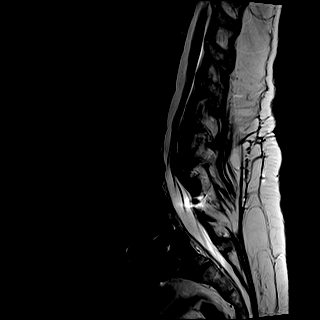
[im 11/17]
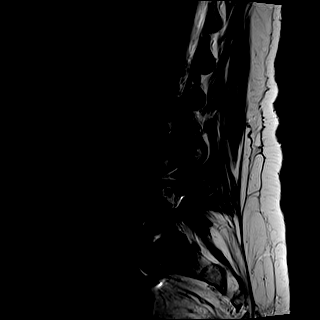
[im 14/17]
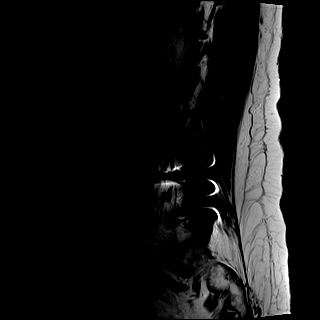
[im 17/17]
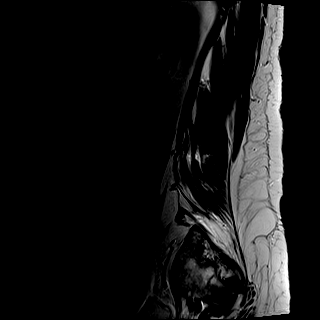

[Series 6: T1 · sagittal · 4.0mm · 0.81mm/px · 7 of 17 slices shown (1 of 2)]
[im 1/17]
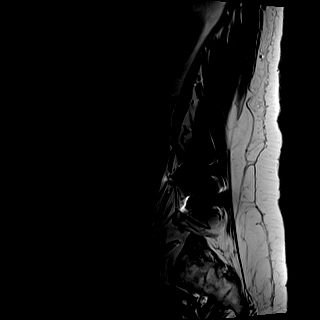
[im 3/17]
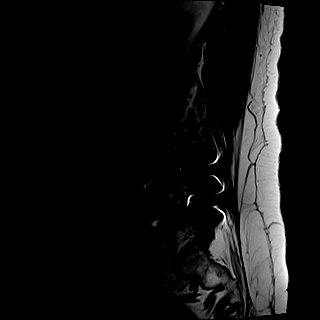
[im 6/17]
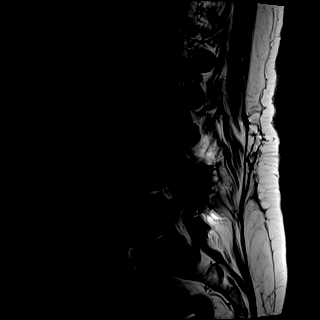
[im 9/17]
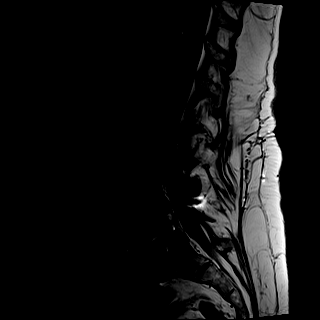
[im 11/17]
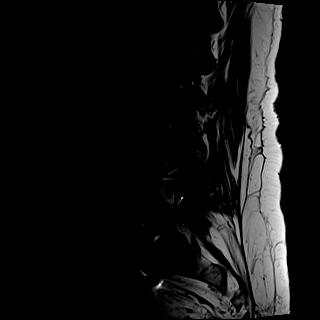
[im 14/17]
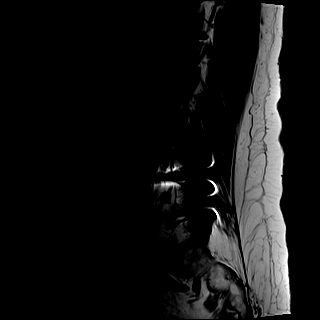
[im 17/17]
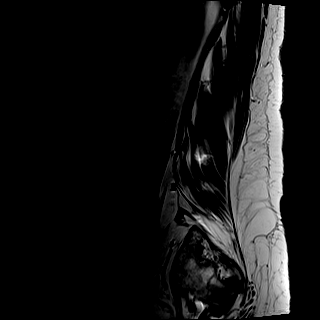

[Series 7: STIR · sagittal · 4.0mm · 0.41mm/px · 1 of 17 slices shown]
[im 1/17]
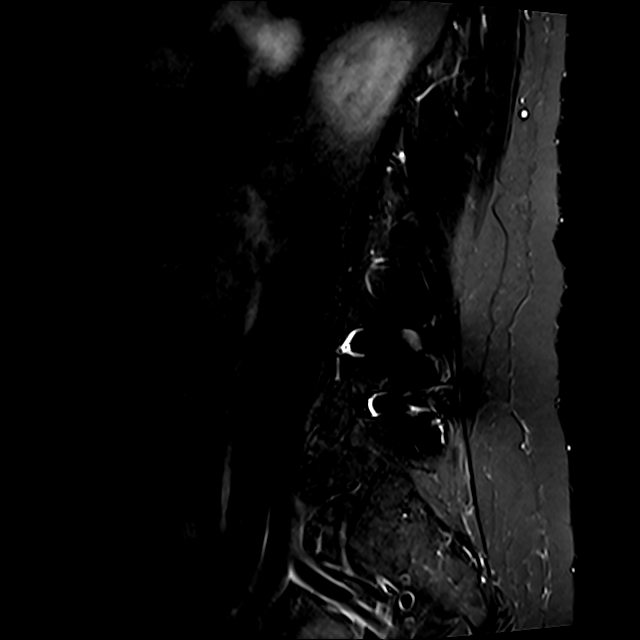

[Series 8: T2 · axial · 4.0mm · 0.78mm/px · z∈[-181,+39]mm · 8 of 38 slices shown (2 of 2)]
[im 1/38]
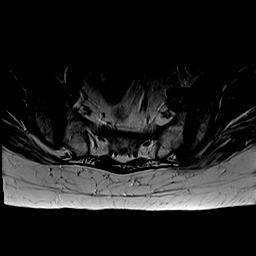
[im 6/38]
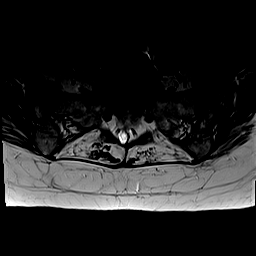
[im 12/38]
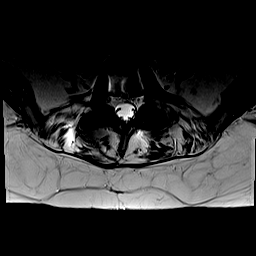
[im 18/38]
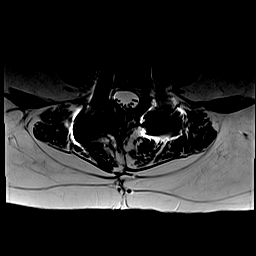
[im 20/38]
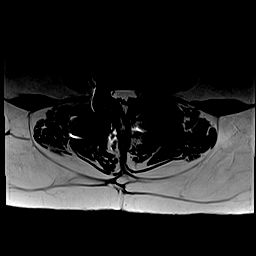
[im 26/38]
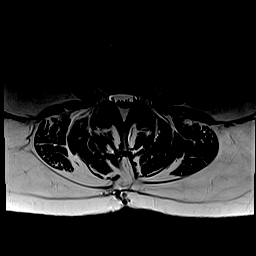
[im 32/38]
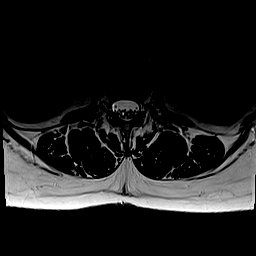
[im 38/38]
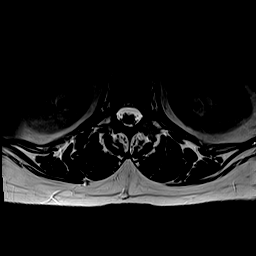

[Series 9: T1 · axial · 4.0mm · 0.39mm/px · z∈[-181,+39]mm · 8 of 38 slices shown (2 of 2)]
[im 1/38]
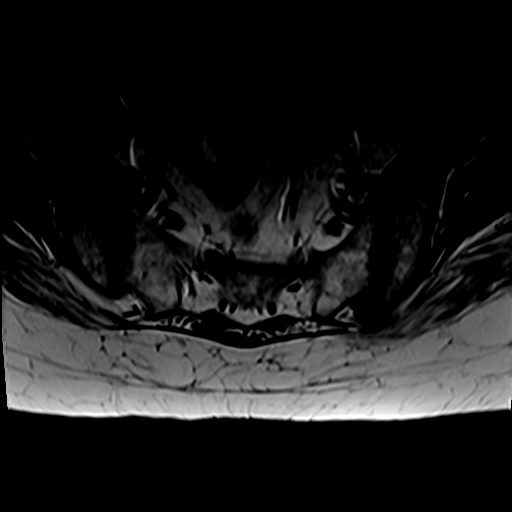
[im 6/38]
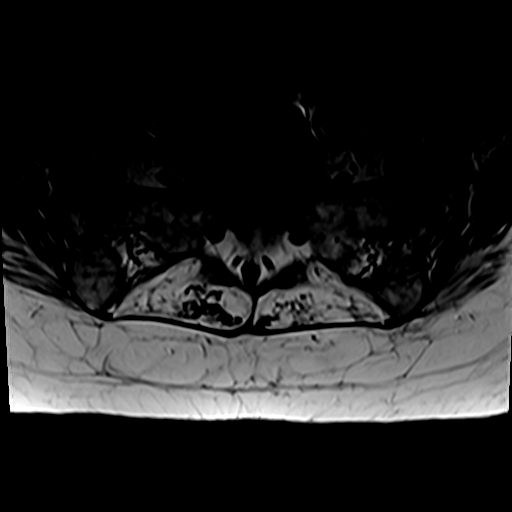
[im 12/38]
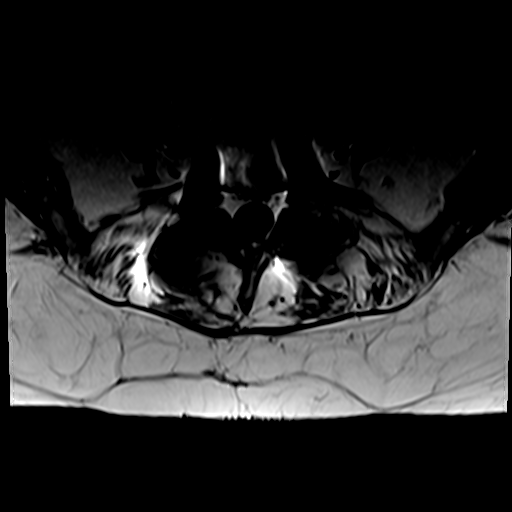
[im 18/38]
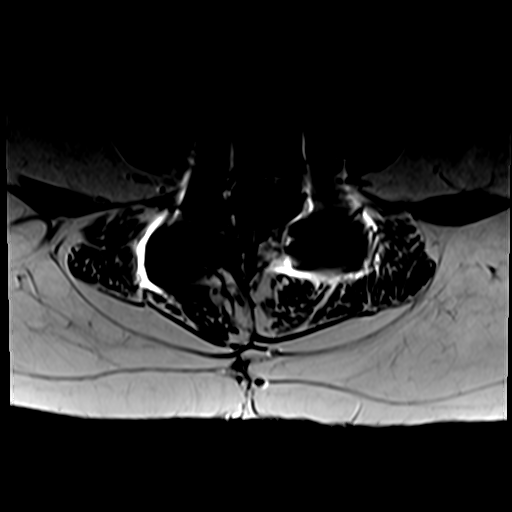
[im 20/38]
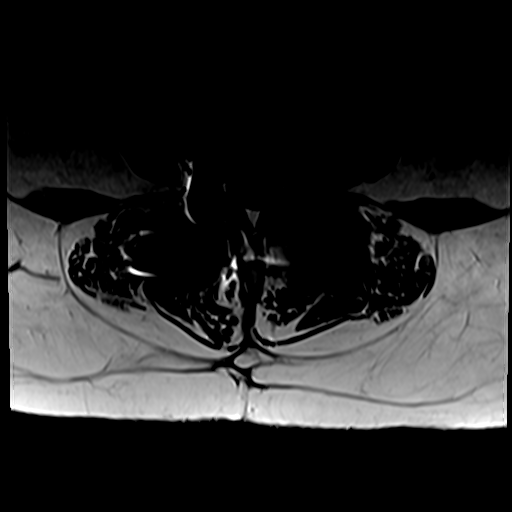
[im 26/38]
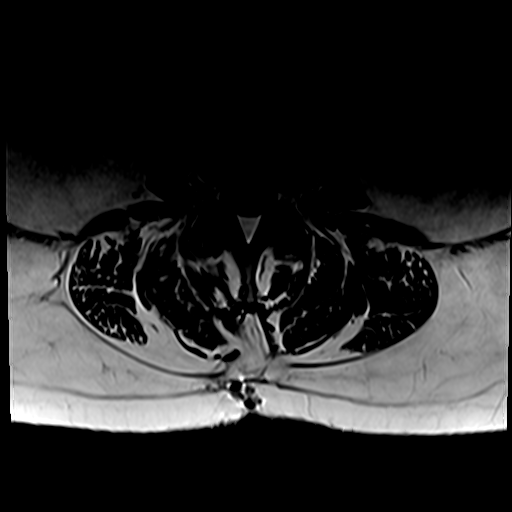
[im 32/38]
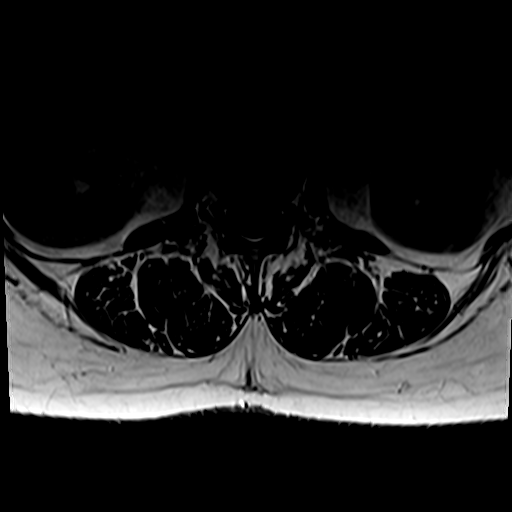
[im 38/38]
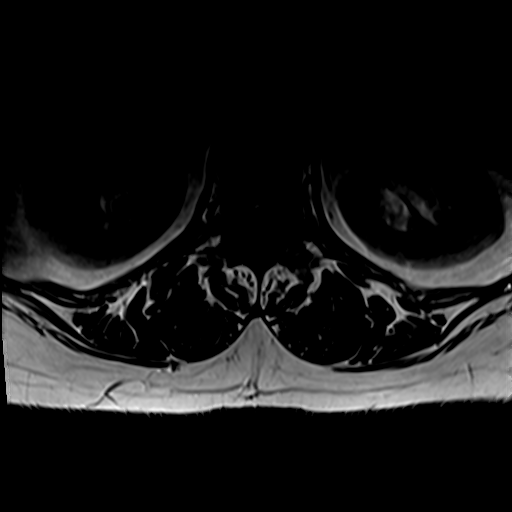

[31 of 48 positions shown; findings below may reference images not displayed]

FINDINGS: Segmentation:  Standard.

Alignment:  Normal.

Vertebrae: Vertebral body heights are preserved. L4-5 Modic type 2
endplate degenerative changes and sequela of posterior spinal
fusion. L5-S1 interbody fusion. Associated susceptibility artifact
limits evaluation. No aggressive osseous lesion.

Conus medullaris and cauda equina: Conus extends to the L1 level.
Conus and cauda equina appear normal.

Disc levels: Multilevel desiccation. Prominent dorsal epidural fat
at the L2-L4 levels.

L1-2: No significant disc bulge. Patent spinal canal and neural
foramen.

L2-3: Minimal disc bulge with left foraminal protrusion. Facet
degenerative spurring. Patent right neural foramen. Mild spinal
canal and left neural foraminal narrowing.

L3-4: Mild disc bulge with shallow left foraminal protrusion. Facet
degenerative spurring. Mild spinal canal, moderate left and mild
right neural foraminal narrowing.

L4-5: Sequela of fusion. No residual disc bulge. Patent spinal canal
and neural foramen.

L5-S1: Sequela of fusion. Mild disc bulge and bilateral facet
hypertrophy. Patent spinal canal and neural foramen.

Paraspinal and other soft tissues: Postsurgical appearance of the
paraspinal soft tissues.
IMPRESSION: Moderate left L3-4 neural foraminal narrowing.

Mild L2-4 spinal canal narrowing. Mild left L2-3, right L3-4 neural
foraminal narrowing.

## 2020-09-05 IMAGING — MR MR HIP*L* W/O CM
6 series · 40 of 40 positions shown · non-contrast
Comparison: CT abdomen and pelvis [DATE].

CLINICAL DATA: Low back pain radiating into both hips. History of
bilateral hip replacement.

EXAM:
MR OF THE BILATERAL HIPS WITHOUT CONTRAST
TECHNIQUE: Multiplanar, multisequence MR imaging was performed. No intravenous
contrast was administered.

[Series 4: T1 · coronal · B · 4.0mm · 1.19mm/px · 8 of 40 slices shown]
[im 1/40]
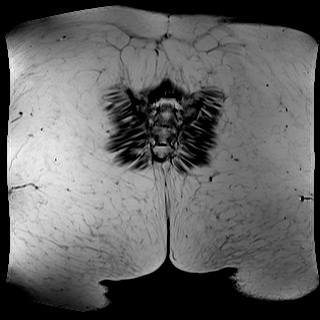
[im 6/40]
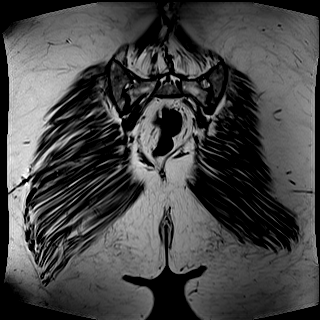
[im 12/40]
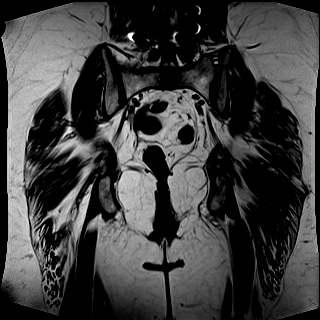
[im 17/40]
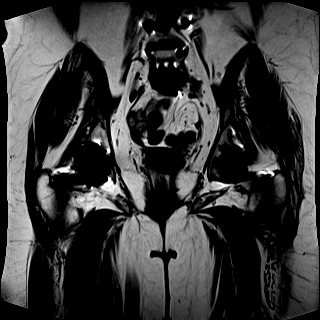
[im 23/40]
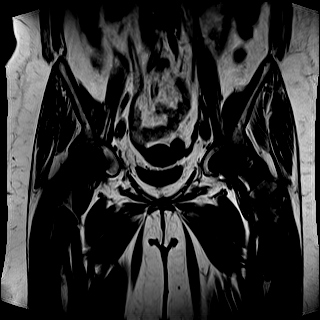
[im 28/40]
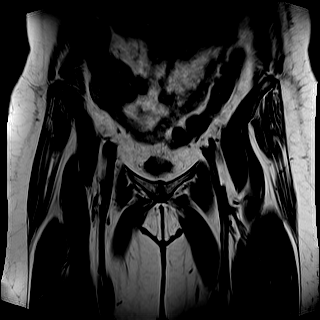
[im 34/40]
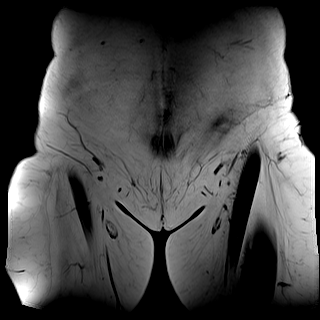
[im 40/40]
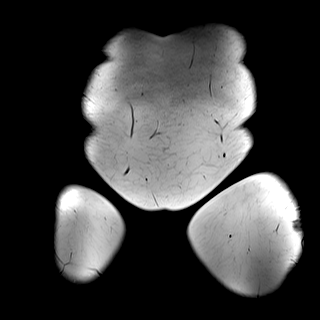

[Series 5: STIR · coronal · B · 4.0mm · 1.56mm/px · 8 of 40 slices shown (1 of 3)]
[im 1/40]
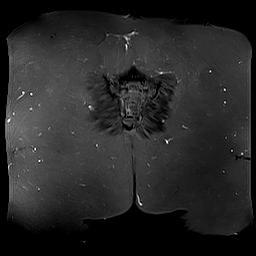
[im 6/40]
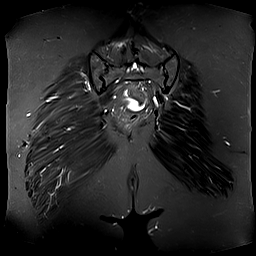
[im 12/40]
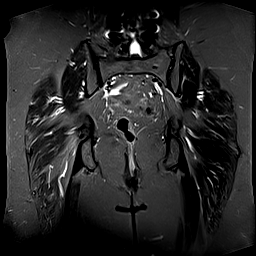
[im 17/40]
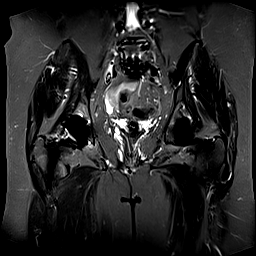
[im 23/40]
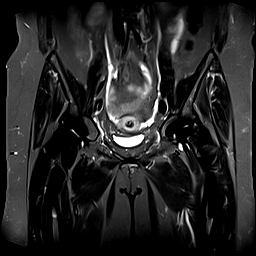
[im 28/40]
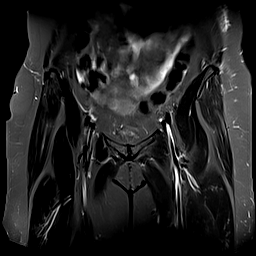
[im 34/40]
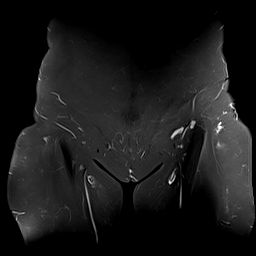
[im 40/40]
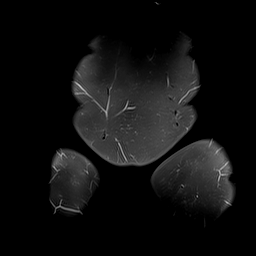

[Series 6: STIR · axial · B · 4.0mm · 1.17mm/px · z∈[-130,+70]mm · 7 of 41 slices shown (2 of 3)]
[im 1/41]
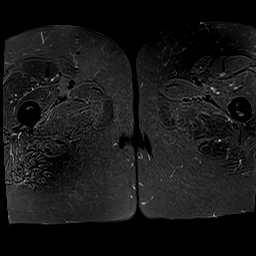
[im 7/41]
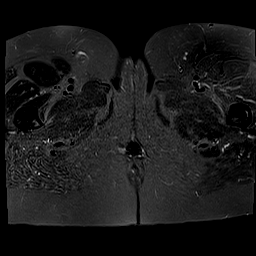
[im 14/41]
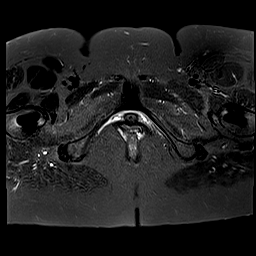
[im 21/41]
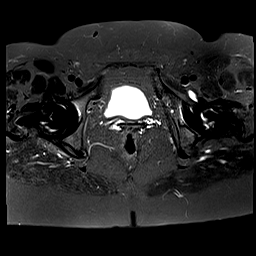
[im 27/41]
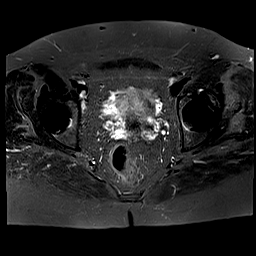
[im 34/41]
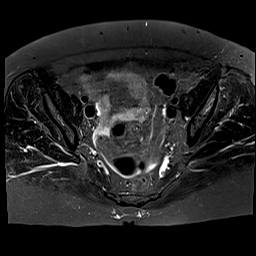
[im 41/41]
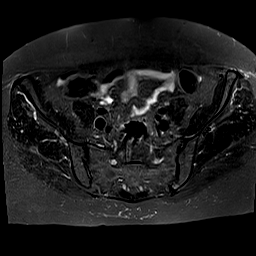

[Series 10: STIR · axial · B · 4.0mm · 1.37mm/px · z∈[-129,+71]mm · 7 of 41 slices shown (3 of 3)]
[im 1/41]
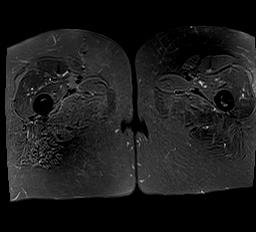
[im 7/41]
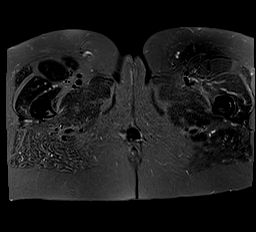
[im 14/41]
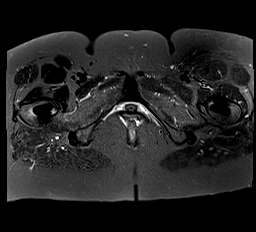
[im 21/41]
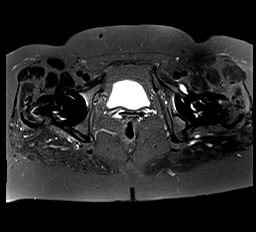
[im 27/41]
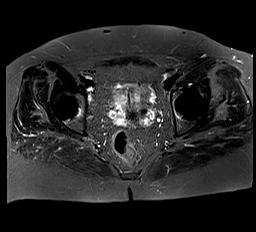
[im 34/41]
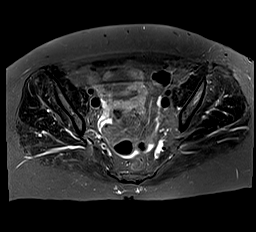
[im 41/41]
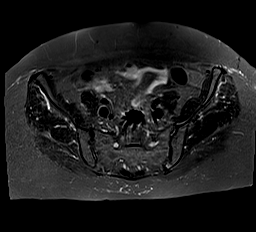

[Series 11: PD fat-sat · sagittal · B · 4.0mm · 0.86mm/px · 5 of 29 slices shown (1 of 2)]
[im 1/29]
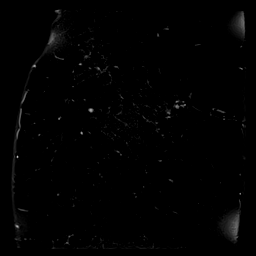
[im 8/29]
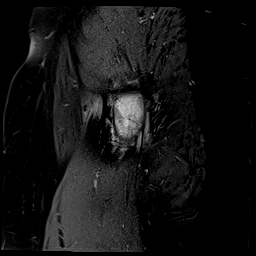
[im 15/29]
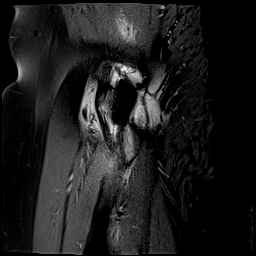
[im 22/29]
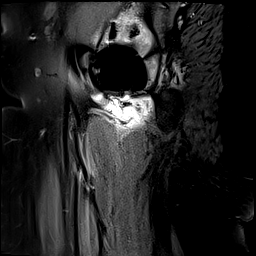
[im 29/29]
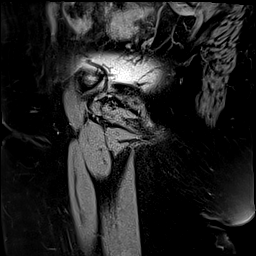

[Series 12: PD fat-sat · coronal · B · 4.0mm · 0.78mm/px · 5 of 29 slices shown (2 of 2)]
[im 1/29]
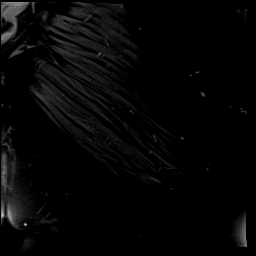
[im 8/29]
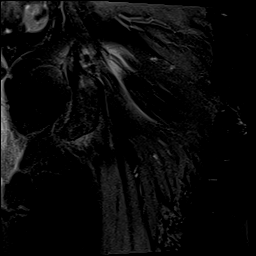
[im 15/29]
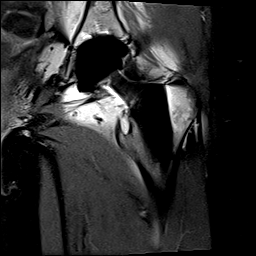
[im 22/29]
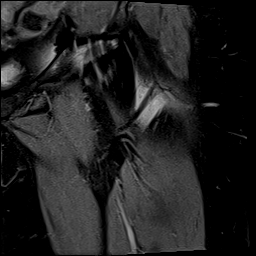
[im 29/29]
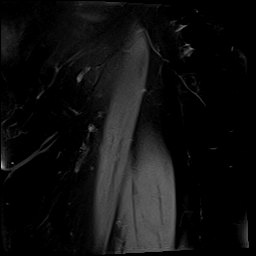

[40 of 40 positions shown; findings below may reference images not displayed]

FINDINGS: Bones/Joint/Cartilage

Artifact reduction techniques were used in this patient with
bilateral total hip arthroplasties. No fracture, stress change or
worrisome lesion is identified. There is no evidence of loosening of
the patient's hip replacements. No bony destructive change or
erosion is seen about either hip. Artifact from lower lumbar fusion
hardware is noted.

Ligaments

Negative.

Muscles and Tendons

There is a very small amount of fluid about the iliopsoas tendons
bilaterally which is greater on the left and compatible with
iliopsoas bursitis. No tendon tear or strain is identified. No
atrophy.

Soft tissues

Imaged intrapelvic contents demonstrate no acute or focal
abnormality.
IMPRESSION: Status post bilateral total hip arthroplasty. No acute abnormality
or complicating feature is identified.

Small volume of fluid about the iliopsoas tendons compatible with
iliopsoas bursitis, greater on the left.

## 2020-09-06 ENCOUNTER — Encounter: Payer: Self-pay | Admitting: Internal Medicine

## 2020-09-06 MED ORDER — FAMOTIDINE 20 MG PO TABS: 20.0000 mg | ORAL_TABLET | Freq: Every day | ORAL | 0 refills | Status: DC

## 2020-09-07 NOTE — Telephone Encounter (Signed)
-----   Message from Delorise Jackson, MD sent at 09/06/2020  8:05 AM EST ----- Mri low back with disc bulging  Surgical changes after prior back surgeries  Moderate narrowing left L3/4 and mild L2-4, right L3-4 F/u [facility]surgery get copy of disc

## 2020-09-07 NOTE — Telephone Encounter (Signed)
-----   Message from Delorise Jackson, MD sent at 09/06/2020 12:41 PM EST ----- Iliopsoas bursitis in hip needs to f/u with ortho left >right side present  Who is ortho does want referral?

## 2020-09-21 ENCOUNTER — Telehealth (INDEPENDENT_AMBULATORY_CARE_PROVIDER_SITE_OTHER): Payer: 59 | Admitting: Psychiatry

## 2020-09-21 ENCOUNTER — Other Ambulatory Visit: Payer: Self-pay

## 2020-09-21 ENCOUNTER — Encounter: Payer: Self-pay | Admitting: Psychiatry

## 2020-09-21 DIAGNOSIS — G4701 Insomnia due to medical condition: Secondary | ICD-10-CM

## 2020-09-21 DIAGNOSIS — F411 Generalized anxiety disorder: Secondary | ICD-10-CM | POA: Diagnosis not present

## 2020-09-21 MED ORDER — HYDROXYZINE HCL 10 MG PO TABS: 10.0000 mg | ORAL_TABLET | Freq: Every day | ORAL | 1 refills | Status: DC | PRN

## 2020-09-21 NOTE — Patient Instructions (Signed)
Hydroxyzine capsules or tablets What is this medicine? HYDROXYZINE (hye [place] i zeen) is an antihistamine. This medicine is used to treat allergy symptoms. It is also used to treat anxiety and tension. This medicine can be used with other medicines to induce sleep before surgery. This medicine may be used for other purposes; ask your health care provider or pharmacist if you have questions. COMMON BRAND NAME(S): ANX, Atarax, Rezine, Vistaril What should I tell my health care provider before I take this medicine? They need to know if you have any of these conditions:  glaucoma  heart disease  history of irregular heartbeat  kidney disease  liver disease  lung or breathing disease, like asthma  stomach or intestine problems  thyroid disease  trouble passing urine  an unusual or allergic reaction to hydroxyzine, cetirizine, other medicines, foods, dyes or preservatives  pregnant or trying to get pregnant  breast-feeding How should I use this medicine? Take this medicine by mouth with a full glass of water. Follow the directions on the prescription label. You may take this medicine with food or on an empty stomach. Take your medicine at regular intervals. Do not take your medicine more often than directed. Talk to your pediatrician regarding the use of this medicine in children. Special care may be needed. While this drug may be prescribed for children as young as 55 years of age for selected conditions, precautions do apply. Patients over 55 years old may have a stronger reaction and need a smaller dose. Overdosage: If you think you have taken too much of this medicine contact a poison control center or emergency room at once. NOTE: This medicine is only for you. Do not share this medicine with others. What if I miss a dose? If you miss a dose, take it as soon as you can. If it is almost time for your next dose, take only that dose. Do not take double or extra doses. What may  interact with this medicine? Do not take this medicine with any of the following medications:  cisapride  dronedarone  pimozide  thioridazine This medicine may also interact with the following medications:  alcohol  antihistamines for allergy, cough, and cold  atropine  barbiturate medicines for sleep or seizures, like phenobarbital  certain antibiotics like erythromycin or clarithromycin  certain medicines for anxiety or sleep  certain medicines for bladder problems like oxybutynin, tolterodine  certain medicines for depression or psychotic disturbances  certain medicines for irregular heart beat  certain medicines for Parkinson's disease like benztropine, trihexyphenidyl  certain medicines for seizures like phenobarbital, primidone  certain medicines for stomach problems like dicyclomine, hyoscyamine  certain medicines for travel sickness like scopolamine  ipratropium  narcotic medicines for pain  other medicines that prolong the QT interval (an abnormal heart rhythm) like dofetilide This list may not describe all possible interactions. Give your health care provider a list of all the medicines, herbs, non-prescription drugs, or dietary supplements you use. Also tell them if you smoke, drink alcohol, or use illegal drugs. Some items may interact with your medicine. What should I watch for while using this medicine? Tell your doctor or health care professional if your symptoms do not improve. You may get drowsy or dizzy. Do not drive, use machinery, or do anything that needs mental alertness until you know how this medicine affects you. Do not stand or sit up quickly, especially if you are an older patient. This reduces the risk of dizzy or fainting spells. Alcohol may  interfere with the effect of this medicine. Avoid alcoholic drinks. Your mouth may get dry. Chewing sugarless gum or sucking hard candy, and drinking plenty of water may help. Contact your doctor if the  problem does not go away or is severe. This medicine may cause dry eyes and blurred vision. If you wear contact lenses you may feel some discomfort. Lubricating drops may help. See your eye doctor if the problem does not go away or is severe. If you are receiving skin tests for allergies, tell your doctor you are using this medicine. What side effects may I notice from receiving this medicine? Side effects that you should report to your doctor or health care professional as soon as possible:  allergic reactions like skin rash, itching or hives, swelling of the face, lips, or tongue  changes in vision  confusion  fast, irregular heartbeat  seizures  tremor  trouble passing urine or change in the amount of urine Side effects that usually do not require medical attention (report to your doctor or health care professional if they continue or are bothersome):  constipation  drowsiness  dry mouth  headache  tiredness This list may not describe all possible side effects. Call your doctor for medical advice about side effects. You may report side effects to FDA at 1-800-FDA-1088. Where should I keep my medicine? Keep out of the reach of children. Store at room temperature between 15 and 30 degrees C (59 and 86 degrees F). Keep container tightly closed. Throw away any unused medicine after the expiration date. NOTE: This sheet is a summary. It may not cover all possible information. If you have questions about this medicine, talk to your doctor, pharmacist, or health care provider.  2021 Elsevier/Gold Standard (2018-06-16 13:19:55)  

## 2020-09-21 NOTE — Progress Notes (Signed)
Virtual Visit via Video Note  I connected with Maureen Randall on 09/21/20 at  2:20 PM EDT by a video enabled telemedicine application and verified that I am speaking with the correct person using two identifiers.  Location Provider Location : [facility] Patient Location : Home  Participants: Patient , Provider   I discussed the limitations of evaluation and management by telemedicine and the availability of in person appointments. The patient expressed understanding and agreed to proceed.   I discussed the assessment and treatment plan with the patient. The patient was provided an opportunity to ask questions and all were answered. The patient agreed with the plan and demonstrated an understanding of the instructions.   The patient was advised to call back or seek an in-person evaluation if the symptoms worsen or if the condition fails to improve as anticipated.   [place] MD OP Progress Note  09/21/2020 2:44 PM Maureen Randall  MRN:  [id number]  Chief Complaint:  Chief Complaint    Follow-up; Anxiety     HPI: Maureen Randall is a 55 year old Caucasian Randall who has a history of GAD, insomnia, chronic pain, primary osteoarthritis, lumbar radiculopathy, degenerative disc disease, spondylolisthesis, bilateral carpal tunnel syndrome, was evaluated by telemedicine today.  Patient today reports she is currently struggling with worry, anxiety symptoms on and off.  She reports she has a lot going on.  She reports she is going to lose her long-term disability and switch to Social Security disability soon.  She reports she hence will lose her health insurance plan and will have to find a new one.  That does worry her.  She reports she has these anxiety and racing thoughts on and off.  When that happens she feels very overwhelmed.  She wonders whether she can be started on a medication to help her to get through those times.  She is interested in getting back in psychotherapy sessions if possible.  She agrees  to call the clinic to schedule an appointment with therapist.  She reports sleep overall is okay on the trazodone.  The trazodone and melatonin combination works most nights.  Patient denies any depressive symptoms.  Patient denies any suicidality or homicidality.  She denies any perceptual disturbances.  Patient denies any other concerns today.  Visit Diagnosis:    ICD-10-CM   1. Generalized anxiety disorder  F41.1 hydrOXYzine (ATARAX/VISTARIL) 10 MG tablet  2. Insomnia due to medical condition  G47.01    anxiety,pain    Past Psychiatric History: I have reviewed past psychiatric history from my progress note on 04/30/2018  Past Medical History:  Past Medical History:  Diagnosis Date  . Anxiety   . Arthritis    osteoarthritis-joints, feet, shoulder  . Asthma    previously diagnosed after a bout of bronchitis/ no problems since  . Bursitis    b/l hips  . Chronic pain syndrome   . COPD (chronic obstructive pulmonary disease) ([place]) 2004   patient states probably bronchitis  . Depression   . DVT (deep venous thrombosis) ([place]) 2012   right leg. after surgery, while on BCP  . GERD (gastroesophageal reflux disease)   . Hyperlipidemia   . Neuropathy    post surgery  . PVD (peripheral vascular disease) ([place])   . Spondylolisthesis     Past Surgical History:  Procedure Laterality Date  . ABLATION     veins in lower legs  . BACK SURGERY  07/2014   lumbar spine/ fusion 1 level  . BREAST BIOPSY  Left 2011   neg  . CARPAL TUNNEL RELEASE Right 12/2015   [facility]  . CARPAL TUNNEL RELEASE Left 12/2016  . COLONOSCOPY WITH PROPOFOL N/A 02/29/2020   Procedure: COLONOSCOPY WITH PROPOFOL;  Surgeon: [name], MD;  Location: [place];  Service: Endoscopy;  Laterality: N/A;  . ESOPHAGOGASTRODUODENOSCOPY (EGD) WITH PROPOFOL N/A 02/29/2020   Procedure: ESOPHAGOGASTRODUODENOSCOPY (EGD) WITH PROPOFOL;  Surgeon: [name], MD;  Location: [facility];  Service:  Endoscopy;  Laterality: N/A;  . FOOT SURGERY Right 2015   broken navicular bone  . HIP ARTHROSCOPY Right 05/2011  . HIP SURGERY     left hip surgery 05/2018  . JOINT REPLACEMENT Bilateral 12/2017 & 05/2018   hip  . L5-S1 OLIF Dr. Rennis Harding back surgery 09/2019  09/28/2019  . left ganglion cyst removal     12/23/19 Dr. Elvina Mattes  . MASS EXCISION Left 12/17/2019   Procedure: EXCISION TUMOR FOOT DEEP LEFT;  Surgeon: Albertine Patricia, DPM;  Location: [facility];  Service: Podiatry;  Laterality: Left;  LMA W/LOCAL  . NASAL TURBINATE REDUCTION Bilateral 06/12/2019   Procedure: TURBINATE REDUCTION/SUBMUCOSAL RESECTION;  Surgeon: [name], MD;  Location: [facility];  Service: ENT;  Laterality: Bilateral;  . 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 ROTATOR CUFF REPAIR     Dr. [name] in 04/2019   . SCLEROTHERAPY     veins in lower legs  . SEPTOPLASTY Bilateral 06/12/2019   Procedure: SEPTOPLASTY;  Surgeon: [name], MD;  Location: [facility];  Service: ENT;  Laterality: Bilateral;  . SHOULDER SURGERY Right 12/2015   [facility] torn rotator cuff  . SPINAL FUSION  2016   [facility] lumbar    Family Psychiatric History: I have reviewed family psychiatric history from my progress note on 04/30/2018  Family History:  Family History  Problem Relation Age of Onset  . Breast cancer Maternal Aunt   . Breast cancer Paternal Aunt   . Dementia Paternal Aunt   . COPD Mother        quit smoking after 60 years  . Arthritis Mother   . Osteoarthritis Mother   . Dementia Father        symptoms started in his 63s  . Alzheimer's disease Father   . Rheum arthritis Father   . Rheum arthritis Other        FH  . Dementia Other        "all my dad's side of the family"  . Other Maternal Grandmother        got dementia as she got older   . Dementia Paternal Grandmother     Social History: I have reviewed social history  from my progress note on 04/30/2018 Social History   Socioeconomic History  . Marital status: Married    Spouse name: fredrick  . Number of children: 1  . Years of education: Not on file  . Highest education level: High school graduate  Occupational History    Comment: full time  Tobacco Use  . Smoking status: Never Smoker  . Smokeless tobacco: Never Used  Vaping Use  . Vaping Use: Never used  Substance and Sexual Activity  . Alcohol use: Yes    Alcohol/week: 1.0 standard drink    Types: 1 Cans of beer per week    Comment: may have drink 1x/month  . Drug use: Never  . Sexual activity: Yes  Other Topics Concern  .  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 Amoxicillin Hives  . Septra [Sulfamethoxazole-Trimethoprim] Hives       . Cephalexin Rash         Metabolic Disorder Labs: Lab Results  Component Value Date   HGBA1C 5.5 09/25/2019   No results found for: PROLACTIN Lab Results  Component Value Date   CHOL 217 (H) 09/25/2019   TRIG 81.0 09/25/2019   HDL 69.10 09/25/2019   CHOLHDL 3 09/25/2019   VLDL 16.2 09/25/2019   LDLCALC 132 (H) 09/25/2019   LDLCALC 156 (H) 01/05/2019   Lab Results  Component Value Date   TSH 3.78 01/05/2019    Therapeutic Level Labs: No results found for: LITHIUM No results found for: VALPROATE No components found for:  CBMZ  Current Medications: Current Outpatient Medications  Medication Sig Dispense Refill  . hydrOXYzine (ATARAX/VISTARIL) 10 MG tablet Take 1-2 tablets (10-20 mg total) by mouth daily  as needed. For anxiety 45 tablet 1  . baclofen (LIORESAL) 10 MG tablet Take 10 mg by mouth 3 (three) times daily as needed for muscle spasms.    [name] dexlansoprazole (DEXILANT) 60 MG capsule Take 1 capsule (60 mg total) by mouth daily. 30 capsule 6  . diazepam (VALIUM) 5 MG tablet Take 1 tablet (5 mg total) by mouth as needed for anxiety. 15 min before MRI and if needed another before MRI 2 tablet 0  . diclofenac Sodium (VOLTAREN) 1 % GEL Apply 2-4 g topically 4 (four) times daily. 2 gram upper body qid prn and 4 gram lower body qid prn 100 g 11  . DULoxetine (CYMBALTA) 60 MG capsule TAKE 1 CAPSULE BY MOUTH EVERY DAY 90 capsule 1  . Elastic Bandages & Supports (RELIEF KNEE) MISC Apply in the morning and remove at night.    . estradiol (ESTRACE) 0.1 MG/GM vaginal cream INSERT PEA SIZED AMOUNT (0.5G) INTO VAGINA NIGHTLY EVERY OTHER NIGHT FOR 2 WEEKS, THEN TWICE WEEKLY    . famotidine (PEPCID) 20 MG tablet Take 1 tablet (20 mg total) by mouth at bedtime. 90 tablet 0  . fluticasone (FLONASE) 50 MCG/ACT nasal spray Place 1-2 sprays into both nostrils daily as needed. Max dose 2 sprays each nostril 16 g 12  . linaclotide (LINZESS) 145 MCG CAPS capsule Take 1 capsule (145 mcg total) by mouth daily before breakfast. 30 capsule 0  . Melatonin 3 MG CAPS Take by mouth.    . methocarbamol (ROBAXIN) 500 MG tablet Take 2 tablets (1,000 mg total) by mouth 2 (two) times daily as needed for muscle spasms. 60 tablet 2  . ondansetron (ZOFRAN ODT) 8 MG disintegrating tablet Take 1 tablet (8 mg total) by mouth every 8 (eight) hours as needed for nausea or vomiting. 40 tablet 0  . oxyCODONE-acetaminophen (PERCOCET) 10-325 MG tablet Take 1 tablet by mouth every 6 (six) hours as needed.    . phentermine (ADIPEX-P) 37.5 MG tablet Take 1 tablet (37.5 mg total) by mouth daily before breakfast. rx 1/2  60 tablet 0  . pregabalin (LYRICA) 50 MG capsule TAKE 1 TO 2 CAPSULES BY MOUTH EVERY 12 HOURS AS NEEDED    . traZODone (DESYREL)  100 MG tablet Take 2 tablets (200 mg total) by mouth at bedtime as needed for sleep. 180 tablet 1  . valACYclovir (VALTREX) 1000 MG tablet as needed. Oral blister     No current facility-administered medications for this visit.     Musculoskeletal: Strength & Muscle Tone: UTA Gait & Station: UTA Patient leans: N/A  Psychiatric Specialty Exam: Review of Systems  Musculoskeletal: Positive for back pain.  Psychiatric/Behavioral: The patient is nervous/anxious.   All other systems reviewed and are negative.   There were no vitals taken for this visit.There is no height or weight on file to calculate BMI.  General Appearance: Casual  Eye Contact:  Fair  Speech:  Clear and Coherent  Volume:  Normal  Mood:  Anxious  Affect:  Congruent  Thought Process:  Goal Directed and Descriptions of Associations: Intact  Orientation:  Full (Time, Place, and Person)  Thought Content: Logical   Suicidal Thoughts:  No  Homicidal Thoughts:  No  Memory:  Immediate;   Fair Recent;   Fair Remote;   Fair  Judgement:  Fair  Insight:  Fair  Psychomotor Activity:  Normal  Concentration:  Concentration: Fair and Attention Span: Fair  Recall:  [employer] of Knowledge: Fair  Language: Fair  Akathisia:  No  Handed:  Right  AIMS (if indicated):UTA  Assets:  Communication Skills Desire for Improvement Housing Social Support  ADL's:  Intact  Cognition: WNL  Sleep:  Improving   Screenings: GAD-7   Flowsheet Row Video Visit from 09/21/2020 in [facility] Video Visit from 10/14/2019 in [facility]  Total GAD-7 Score 4 [place] Office Visit from 09/23/2019 in [facility]  Total Score (max 30 points ) 27    PHQ2-9   Flowsheet Row Video Visit from 09/21/2020 in [facility] Video Visit from 10/14/2019 in [facility] Office Visit from 08/14/2019 in [facility] Office Visit from 07/07/2019 in [facility] Office Visit from 03/13/2019 in [facility]  PHQ-2 Total Score 1 0 0 0 0  PHQ-9 Total Score -- -- 0 -- 0    Flowsheet Row Video Visit from 09/21/2020 in [facility] [place] CATEGORY No Risk       Assessment and Plan: Maureen Randall is a 55 year old Caucasian Randall, married, currently on SSD, lives in [place], has a history of GAD, insomnia, chronic pain, osteoarthritis, degenerative disc disease, lumbar radiculopathy, bilateral carpal tunnel syndrome, multiple surgery including recent left foot surgery was evaluated by telemedicine today.  Patient is currently struggling with anxiety symptoms and will benefit from the following plan.  Plan GAD-unstable Cymbalta 60 mg p.o. daily Patient reports intermittent anxiety symptoms due to situational stressors. Start hydroxyzine 10 to 20 mg p.o. daily as needed for severe anxiety attacks only.  Patient advised to limit use.  Discussed drug to drug interaction with her medications like Lyrica, Robaxin and oxycodone. We will consider increasing the Cymbalta dosage or changing to another antidepressant-SSRI/SNRI in the future. Patient advised to restart psychotherapy sessions.  She agrees to give the clinic a call.   Insomnia-improving Trazodone 150-200 mg p.o. nightly as needed Melatonin 3 mg p.o. nightly  Patient to continue to follow-up  with pain provider for pain management.  Follow-up in clinic in 3 weeks or sooner if needed.  This note was generated in part or whole with voice recognition software. Voice recognition is usually quite accurate but there are transcription errors that can and very often do occur. I apologize for any typographical errors that were not detected and corrected.        Ursula Alert, MD 09/22/2020, 10:53 AM

## 2020-09-22 ENCOUNTER — Other Ambulatory Visit (INDEPENDENT_AMBULATORY_CARE_PROVIDER_SITE_OTHER): Payer: 59

## 2020-09-22 DIAGNOSIS — Z1329 Encounter for screening for other suspected endocrine disorder: Secondary | ICD-10-CM

## 2020-09-22 DIAGNOSIS — R030 Elevated blood-pressure reading, without diagnosis of hypertension: Secondary | ICD-10-CM | POA: Diagnosis not present

## 2020-09-22 DIAGNOSIS — E785 Hyperlipidemia, unspecified: Secondary | ICD-10-CM

## 2020-09-22 DIAGNOSIS — Z1389 Encounter for screening for other disorder: Secondary | ICD-10-CM

## 2020-09-22 LAB — LIPID PANEL
Cholesterol: 231 mg/dL — ABNORMAL HIGH (ref 0–200)
HDL: 61.9 mg/dL (ref >39.00)
LDL Cholesterol: 151 mg/dL — ABNORMAL HIGH (ref 0–99)
NonHDL: 168.74
Total CHOL/HDL Ratio: 4
Triglycerides: 90 mg/dL (ref 0.0–149.0)
VLDL: 18 mg/dL (ref 0.0–40.0)

## 2020-09-22 LAB — CBC WITH DIFFERENTIAL/PLATELET
Basophils Absolute: 0 10*3/uL (ref 0.0–0.1)
Basophils Relative: 0.2 % (ref 0.0–3.0)
Eosinophils Absolute: 0 10*3/uL (ref 0.0–0.7)
Eosinophils Relative: 0 % (ref 0.0–5.0)
HCT: 40.2 % (ref 36.0–46.0)
Hemoglobin: 13.8 g/dL (ref 12.0–15.0)
Lymphocytes Relative: 29.7 % (ref 12.0–46.0)
Lymphs Abs: 1.3 10*3/uL (ref 0.7–4.0)
MCHC: 34.3 g/dL (ref 30.0–36.0)
MCV: 92.5 fl (ref 78.0–100.0)
Monocytes Absolute: 0.9 10*3/uL (ref 0.1–1.0)
Monocytes Relative: 20.9 % — ABNORMAL HIGH (ref 3.0–12.0)
Neutro Abs: 2.1 10*3/uL (ref 1.4–7.7)
Neutrophils Relative %: 49.2 % (ref 43.0–77.0)
Platelets: 391 10*3/uL (ref 150.0–400.0)
RBC: 4.35 Mil/uL (ref 3.87–5.11)
RDW: 13.5 % (ref 11.5–15.5)
WBC: 4.3 10*3/uL (ref 4.0–10.5)

## 2020-09-22 LAB — COMPREHENSIVE METABOLIC PANEL
ALT: 28 U/L (ref 0–35)
AST: 15 U/L (ref 0–37)
Albumin: 4.2 g/dL (ref 3.5–5.2)
Alkaline Phosphatase: 75 U/L (ref 39–117)
BUN: 22 mg/dL (ref 6–23)
CO2: 26 mEq/L (ref 19–32)
Calcium: 9.6 mg/dL (ref 8.4–10.5)
Chloride: 107 mEq/L (ref 96–112)
Creatinine, Ser: 0.85 mg/dL (ref 0.40–1.20)
GFR: 77.28 mL/min (ref >60.00)
Glucose, Bld: 94 mg/dL (ref 70–99)
Potassium: 4 mEq/L (ref 3.5–5.1)
Sodium: 139 mEq/L (ref 135–145)
Total Bilirubin: 0.5 mg/dL (ref 0.2–1.2)
Total Protein: 7.2 g/dL (ref 6.0–8.3)

## 2020-09-22 LAB — TSH: TSH: 1.49 u[IU]/mL (ref 0.35–4.50)

## 2020-09-23 ENCOUNTER — Other Ambulatory Visit: Payer: Self-pay | Admitting: Internal Medicine

## 2020-09-23 DIAGNOSIS — K219 Gastro-esophageal reflux disease without esophagitis: Secondary | ICD-10-CM

## 2020-09-23 LAB — URINALYSIS, ROUTINE W REFLEX MICROSCOPIC
Bilirubin, UA: NEGATIVE
Glucose, UA: NEGATIVE
Ketones, UA: NEGATIVE
Leukocytes,UA: NEGATIVE
Nitrite, UA: NEGATIVE
RBC, UA: NEGATIVE
Specific Gravity, UA: 1.026 (ref 1.005–1.030)
Urobilinogen, Ur: 1 mg/dL (ref 0.2–1.0)
pH, UA: 5.5 (ref 5.0–7.5)

## 2020-09-27 ENCOUNTER — Other Ambulatory Visit: Payer: Self-pay

## 2020-09-27 ENCOUNTER — Encounter: Payer: Self-pay | Admitting: Internal Medicine

## 2020-09-27 ENCOUNTER — Ambulatory Visit (INDEPENDENT_AMBULATORY_CARE_PROVIDER_SITE_OTHER): Payer: 59 | Admitting: Internal Medicine

## 2020-09-27 VITALS — BP 116/72 | HR 72 | Temp 98.3°F | Ht 67.0 in | Wt 197.4 lb

## 2020-09-27 DIAGNOSIS — D72821 Monocytosis (symptomatic): Secondary | ICD-10-CM

## 2020-09-27 DIAGNOSIS — M7072 Other bursitis of hip, left hip: Secondary | ICD-10-CM

## 2020-09-27 DIAGNOSIS — M7071 Other bursitis of hip, right hip: Secondary | ICD-10-CM

## 2020-09-27 DIAGNOSIS — E785 Hyperlipidemia, unspecified: Secondary | ICD-10-CM

## 2020-09-27 NOTE — Progress Notes (Signed)
Chief Complaint  Patient presents with   Follow-up   F/u  1. Iliopsoas bursitis c/o b/l hip pain right worse than left though injections helped on the left L3/4 had b/l steroids and had injections by NS still having low back pain 5/10  2. HLD and monocytosis will repeat labs upcoming    Review of Systems  Constitutional: Negative for weight loss.  HENT: Negative for hearing loss.   Eyes: Negative for blurred vision.  Respiratory: Negative for shortness of breath.   Cardiovascular: Negative for chest pain.  Gastrointestinal: Negative for abdominal pain.  Musculoskeletal: Positive for back pain and joint pain.  Skin: Negative for rash.  Psychiatric/Behavioral:       +stress    Past Medical History:  Diagnosis Date   Anxiety    Arthritis    osteoarthritis-joints, feet, shoulder   Asthma    previously diagnosed after a bout of bronchitis/ no problems since   Bursitis    b/l hips   Chronic pain syndrome    COPD (chronic obstructive pulmonary disease) ([place]) 2004   patient states probably bronchitis   Depression    DVT (deep venous thrombosis) ([place]) 2012   right leg. after surgery, while on BCP   GERD (gastroesophageal reflux disease)    Hyperlipidemia    Neuropathy    post surgery   PVD (peripheral vascular disease) ([facility])    Spondylolisthesis    Past Surgical History:  Procedure Laterality Date   ABLATION     veins in lower legs   BACK SURGERY  07/2014   lumbar spine/ fusion 1 level   BREAST BIOPSY Left 2011   neg   CARPAL TUNNEL RELEASE Right 12/2015   [facility]   CARPAL TUNNEL RELEASE Left 12/2016   COLONOSCOPY WITH PROPOFOL N/A 02/29/2020   Procedure: COLONOSCOPY WITH PROPOFOL;  Surgeon: [name], MD;  Location: [facility];  Service: Endoscopy;  Laterality: N/A;   ESOPHAGOGASTRODUODENOSCOPY (EGD) WITH PROPOFOL N/A 02/29/2020   Procedure: ESOPHAGOGASTRODUODENOSCOPY (EGD) WITH PROPOFOL;  Surgeon: [name], MD;  Location:  [facility];  Service: Endoscopy;  Laterality: N/A;   FOOT SURGERY Right 2015   broken navicular bone   HIP ARTHROSCOPY Right 05/2011   HIP SURGERY     left hip surgery 05/2018   JOINT REPLACEMENT Bilateral 12/2017 & 05/2018   hip   L5-S1 OLIF Dr. Rennis Harding back surgery 09/2019  09/28/2019   left ganglion cyst removal     12/23/19 Dr. Elvina Mattes   MASS EXCISION Left 12/17/2019   Procedure: EXCISION TUMOR FOOT DEEP LEFT;  Surgeon: Albertine Patricia, DPM;  Location: [facility];  Service: Podiatry;  Laterality: Left;  LMA W/LOCAL   NASAL TURBINATE REDUCTION Bilateral 06/12/2019   Procedure: TURBINATE REDUCTION/SUBMUCOSAL RESECTION;  Surgeon: [name], MD;  Location: [facility];  Service: ENT;  Laterality: Bilateral;   REPLACEMENT TOTAL HIP W/  RESURFACING IMPLANTS Right 12/2017   [facility]   ROTATOR CUFF REPAIR     Dr. [name] in 04/2019    SCLEROTHERAPY     veins in lower legs   SEPTOPLASTY Bilateral 06/12/2019   Procedure: SEPTOPLASTY;  Surgeon: [name], MD;  Location: [facility];  Service: ENT;  Laterality: Bilateral;   SHOULDER SURGERY Right 12/2015   [facility] torn rotator cuff   SPINAL FUSION  2016   [facility] lumbar   Family History  Problem Relation Age of Onset   Breast cancer Maternal Aunt    Breast cancer Paternal  Aunt    Dementia Paternal Aunt    COPD Mother        quit smoking after 15 years   Arthritis Mother    Osteoarthritis Mother    Dementia Father        symptoms started in his 43s   Alzheimer's disease Father    Rheum arthritis Father    Rheum arthritis Other        FH   Dementia Other        "all my dad's side of the family"   Other Maternal Grandmother        got dementia as she got older    Dementia Paternal Grandmother    Social History   Socioeconomic History   Marital status: Married    Spouse name: fredrick   Number of children: 1   Years of education: Not  on file   Highest education level: High school graduate  Occupational History    Comment: full time  Tobacco Use   Smoking status: Never Smoker   Smokeless tobacco: Never Used  Vaping Use   Vaping Use: Never used  Substance and Sexual Activity   Alcohol use: Yes    Alcohol/week: 1.0 standard drink    Types: 1 Cans of beer per week    Comment: may have drink 1x/month   Drug use: Never   Sexual activity: Yes  Other Topics Concern   Not on file  Social History Narrative   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   dexlansoprazole (DEXILANT) 60 MG capsule Take 1 capsule (60 mg total) by mouth daily.   diclofenac Sodium (VOLTAREN) 1 % GEL Apply 2-4 g topically 4 (four) times daily. 2 gram upper body qid prn and 4 gram lower body qid prn   DULoxetine (CYMBALTA) 60 MG capsule TAKE 1 CAPSULE BY MOUTH EVERY DAY   Elastic Bandages & Supports (RELIEF KNEE) MISC Apply in the morning and remove at night.   estradiol (ESTRACE) 0.1 MG/GM vaginal cream INSERT PEA SIZED AMOUNT (0.5G) INTO VAGINA NIGHTLY EVERY OTHER NIGHT FOR 2 WEEKS, THEN TWICE WEEKLY   famotidine (PEPCID) 20 MG tablet Take 1 tablet (20 mg total) by mouth at bedtime.   fluticasone (FLONASE) 50 MCG/ACT nasal spray Place 1-2 sprays into both nostrils daily as needed. Max dose 2 sprays each nostril   hydrOXYzine (ATARAX/VISTARIL) 10 MG tablet Take 1-2 tablets (10-20 mg total) by mouth daily as needed. For  anxiety   linaclotide (LINZESS) 145 MCG CAPS capsule Take 1 capsule (145 mcg total) by mouth daily before breakfast.   Melatonin 3 MG CAPS Take by mouth.   methocarbamol (ROBAXIN) 500 MG tablet Take 2 tablets (1,000 mg total) by mouth 2 (two) times daily as needed for muscle spasms.   ondansetron (ZOFRAN ODT) 8 MG disintegrating tablet Take 1 tablet (8 mg total) by mouth every 8 (eight) hours as  needed for nausea or vomiting.   oxyCODONE-acetaminophen (PERCOCET) 10-325 MG tablet Take 1 tablet by mouth every 6 (six) hours as needed.   phentermine (ADIPEX-P) 37.5 MG tablet Take 1 tablet (37.5 mg total) by mouth daily before breakfast. rx 1/2   pregabalin (LYRICA) 50 MG capsule TAKE 1 TO 2 CAPSULES BY MOUTH EVERY 12 HOURS AS NEEDED   traZODone (DESYREL) 100 MG tablet Take 2 tablets (200 mg total) by mouth at bedtime as needed for sleep.   valACYclovir (VALTREX) 1000 MG tablet as needed. Oral blister   Allergies  Allergen Reactions   Amoxicillin Hives   Septra [Sulfamethoxazole-Trimethoprim] Hives        Cephalexin Rash        Recent Results (from the past 2160 hour(s))  CBC w/Diff     Status: Abnormal   Collection Time: 09/22/20 10:26 AM  Result Value Ref Range   WBC 4.3 4.0 - 10.5 K/uL   RBC 4.35 3.87 - 5.11 Mil/uL   Hemoglobin 13.8 12.0 - 15.0 g/dL   HCT 40.2 36.0 - 46.0 %   MCV 92.5 78.0 - 100.0 fl   MCHC 34.3 30.0 - 36.0 g/dL   RDW 13.5 11.5 - 15.5 %   Platelets 391.0 150.0 - 400.0 K/uL   Neutrophils Relative % 49.2 43.0 - 77.0 %   Lymphocytes Relative 29.7 12.0 - 46.0 %   Monocytes Relative 20.9 (H) 3.0 - 12.0 %    Comment: Rechecked and verified result.   Eosinophils Relative 0.0 0.0 - 5.0 %   Basophils Relative 0.2 0.0 - 3.0 %   Neutro Abs 2.1 1.4 - 7.7 K/uL   Lymphs Abs 1.3 0.7 - 4.0 K/uL   Monocytes Absolute 0.9 0.1 - 1.0 K/uL   Eosinophils Absolute 0.0 0.0 - 0.7 K/uL   Basophils Absolute 0.0 0.0 - 0.1 K/uL  Urinalysis, Routine w reflex microscopic      Status: None   Collection Time: 09/22/20 10:26 AM  Result Value Ref Range   Specific Gravity, UA 1.026 1.005 - 1.030   pH, UA 5.5 5.0 - 7.5   Color, UA Yellow Yellow   Appearance Ur Clear Clear   Leukocytes,UA Negative Negative   Protein,UA Trace Negative/Trace   Glucose, UA Negative Negative   Ketones, UA Negative Negative   RBC, UA Negative Negative   Bilirubin, UA Negative Negative   Urobilinogen, Ur 1.0 0.2 - 1.0 mg/dL   Nitrite, UA Negative Negative   Microscopic Examination Comment     Comment: Microscopic not indicated and not performed.  TSH     Status: None   Collection Time: 09/22/20 10:26 AM  Result Value Ref Range   TSH 1.49 0.35 - 4.50 uIU/mL  Lipid panel     Status: Abnormal   Collection Time: 09/22/20 10:26 AM  Result Value Ref Range   Cholesterol 231 (H) 0 - 200 mg/dL    Comment: ATP III Classification       Desirable:  < 200 mg/dL               Borderline High:  200 - 239 mg/dL          High:  > = 240 mg/dL   Triglycerides 90.0 0.0 - 149.0 mg/dL    Comment: Normal:  <150 mg/dLBorderline High:  150 - 199 mg/dL   HDL 61.90 >39.00 mg/dL   VLDL 18.0 0.0 - 40.0 mg/dL   LDL Cholesterol 151 (H) 0 - 99 mg/dL   Total CHOL/HDL Ratio 4  Comment:                Men          Women1/2 Average Risk     3.4          3.3Average Risk          5.0          4.42X Average Risk          9.6          7.13X Average Risk          15.0          11.0                       NonHDL 168.74     Comment: NOTE:  Non-HDL goal should be 30 mg/dL higher than patient's LDL goal (i.e. LDL goal of < 70 mg/dL, would have non-HDL goal of < 100 mg/dL)  Comprehensive metabolic panel     Status: None   Collection Time: 09/22/20 10:26 AM  Result Value Ref Range   Sodium 139 135 - 145 mEq/L   Potassium 4.0 3.5 - 5.1 mEq/L   Chloride 107 96 - 112 mEq/L   CO2 26 19 - 32 mEq/L   Glucose, Bld 94 70 - 99 mg/dL   BUN 22 6 - 23 mg/dL   Creatinine, Ser 0.85 0.40 - 1.20 mg/dL   Total Bilirubin 0.5 0.2 - 1.2  mg/dL   Alkaline Phosphatase 75 39 - 117 U/L   AST 15 0 - 37 U/L   ALT 28 0 - 35 U/L   Total Protein 7.2 6.0 - 8.3 g/dL   Albumin 4.2 3.5 - 5.2 g/dL   GFR 77.28 >60.00 mL/min    Comment: Calculated using the CKD-EPI Creatinine Equation (2021)   Calcium 9.6 8.4 - 10.5 mg/dL   Objective  Body mass index is 30.92 kg/m. Wt Readings from Last 3 Encounters:  09/27/20 197 lb 6.4 oz (89.5 kg)  08/17/20 201 lb 6.4 oz (91.4 kg)  08/04/20 202 lb 6.4 oz (91.8 kg)   Temp Readings from Last 3 Encounters:  09/27/20 98.3 F (36.8 C) (Oral)  08/17/20 98.2 F (36.8 C) (Oral)  06/19/20 98.1 F (36.7 C) (Oral)   BP Readings from Last 3 Encounters:  09/27/20 116/72  08/17/20 122/78  08/04/20 113/76   Pulse Readings from Last 3 Encounters:  09/27/20 72  08/17/20 83  08/04/20 79    Physical Exam Vitals and nursing note reviewed.  Constitutional:      Appearance: Normal appearance. She is well-developed and well-groomed. She is obese.  HENT:     Head: Normocephalic and atraumatic.  Eyes:     Conjunctiva/sclera: Conjunctivae normal.     Pupils: Pupils are equal, round, and reactive to light.  Cardiovascular:     Rate and Rhythm: Normal rate and regular rhythm.     Heart sounds: Normal heart sounds. No murmur heard.   Pulmonary:     Effort: Pulmonary effort is normal.     Breath sounds: Normal breath sounds.  Musculoskeletal:     Right hip: Bony tenderness present.     Left hip: Bony tenderness present.  Skin:    General: Skin is warm and dry.  Neurological:     General: No focal deficit present.     Mental Status: She is alert and oriented to person, place, and time. Mental status is at baseline.  Gait: Gait normal.  Psychiatric:        Attention and Perception: Attention and perception normal.        Mood and Affect: Mood and affect normal.        Speech: Speech normal.        Behavior: Behavior normal. Behavior is cooperative.        Thought Content: Thought content  normal.        Cognition and Memory: Cognition and memory normal.        Judgment: Judgment normal.     Assessment  Plan  Iliopsoas bursitis of both hips [facility] Dr. Alvan Dame in [facility]  Dr. Lyla Glassing  [facility] Dr Amado Coe  [facility] chronic back pain   Monocytosis - Plan: CBC with Differential/Platelet  Hyperlipidemia, unspecified hyperlipidemia type - Plan: Lipid panel  Declines meds for now but disc consider statin   HM Declines flu shot  Tdaputd  Pfizer3/3 shingrix 1/2 given today 2nd dose at f/u 10/26/20 or health department Consider hep B vaccine in future NR 08/31/15 -disc new Hep B vaccine in the futurex 2 doses  HCV neg 08/31/15  Pap neg 09/11/16 neg neg HPV Will f/u Dr. Leafy Ro has IUDchecked 12/21/21and pap neg neg HPV  Mammogram 08/12/19 ob/gyn ordered Dr. Esaw Grandchild 2/8/22and negative   dexa6/28/21 normal  Colonoscopyutd had 02/2020 f/u in 10 years with EGD Dr. Allen Norris  Dermatology was due 09/2018 f/u usu. Yearly [facility] Barnetta [facility] Per pt seen in 2021 and negative no bxs or ln2  Never smoker   Other providers Former PCP [facility] Dr. Garen Grams then [place] [facility] requested all imaging Xrays, MRIS, CT Pain Dr. Alvira Monday in [place] specialist Dr. Levada Schilling spine in [place] [facility]-Dr. Tami Ribas stated pt did not have allergies of note had testing Psychiatry and therapy-following  PT [facility] Dr. Tyler Pita, Dr. Lonia Blood [facility]  Ortho spine Dr. Rennis Harding  Ortho Dr .[name] [facility] in [place]  Of note meds pt gets from PCP  Valtrex  adipex  Macrobid  Robaxin  voltaren gel  04/14/19 ENT Dr. Tami Ribas sinusitis deviated nasal septum right nasal endoscopy and left nasacort 55 nasal hoarse voice small hemorrhage right cord 2/2 coughing and inhalers f/u in 6 weeks after shoulder surgery   Provider: Dr. Olivia Mackie McLean-Scocuzza-Internal Medicine

## 2020-09-27 NOTE — Patient Instructions (Addendum)
shingrix due by 02/14/21 check with [facility] [phone]   [facility] Dr. Alvan Dame in [facility]  Dr. Lyla Glassing    [facility]  [place] [id number] [phone]   [facility]  [address]  [place] [address]  [phone]   High Cholesterol  High cholesterol is a condition in which the blood has high levels of a white, waxy substance similar to fat (cholesterol). The liver makes all the cholesterol that the body needs. The human body needs small amounts of cholesterol to help build cells. A person gets extra or excess cholesterol from the food that he or she eats. The blood carries cholesterol from the liver to the rest of the body. If you have high cholesterol, deposits (plaques) may build up on the walls of your arteries. Arteries are the blood vessels that carry blood away from your heart. These plaques make the arteries narrow and stiff. Cholesterol plaques increase your risk for heart attack and stroke. Work with your health care provider to keep your cholesterol levels in a healthy range. What increases the risk? The following factors may make you more likely to develop this condition:  Eating foods that are high in animal fat (saturated fat) or cholesterol.  Being overweight.  Not getting enough exercise.  A family history of high cholesterol (familial hypercholesterolemia).  Use of tobacco products.  Having diabetes. What are the signs or symptoms? There are no symptoms of this condition. How is this diagnosed? This condition may be diagnosed based on the results of a blood test.  If you are older than 55 years of age, your health care provider may check your cholesterol levels every 4-6 years.  You may be checked more often if you have high cholesterol or other risk factors for heart disease. The  blood test for cholesterol measures:  "Bad" cholesterol, or LDL cholesterol. This is the main type of cholesterol that causes heart disease. The desired level is less than 100 mg/dL.  "Good" cholesterol, or HDL cholesterol. HDL helps protect against heart disease by cleaning the arteries and carrying the LDL to the liver for processing. The desired level for HDL is 60 mg/dL or higher.  Triglycerides. These are fats that your body can store or burn for energy. The desired level is less than 150 mg/dL.  Total cholesterol. This measures the total amount of cholesterol in your blood and includes LDL, HDL, and triglycerides. The desired level is less than 200 mg/dL. How is this treated? This condition may be treated with:  Diet changes. You may be asked to eat foods that have more fiber and less saturated fats or added sugar.  Lifestyle changes. These may include regular exercise, maintaining a healthy weight, and quitting use of tobacco products.  Medicines. These are given when diet and lifestyle changes have not worked. You may be prescribed a statin medicine to help lower your cholesterol levels. Follow these instructions at home: Eating and drinking  Eat a healthy, balanced diet. This diet includes: ? Daily servings of a variety of fresh, frozen, or canned fruits and vegetables. ? Daily servings of whole grain foods that are rich in fiber. ? Foods that are low in saturated fats and trans fats. These include poultry and fish without skin, lean cuts of meat, and low-fat dairy products. ? A variety of fish, especially oily  fish that contain omega-3 fatty acids. Aim to eat fish at least 2 times a week.  Avoid foods and drinks that have added sugar.  Use healthy cooking methods, such as roasting, grilling, broiling, baking, poaching, steaming, and stir-frying. Do not fry your food except for stir-frying.   Lifestyle  Get regular exercise. Aim to exercise for a total of 150 minutes a week.  Increase your activity level by doing activities such as gardening, walking, and taking the stairs.  Do not use any products that contain nicotine or tobacco, such as cigarettes, e-cigarettes, and chewing tobacco. If you need help quitting, ask your health care provider.   General instructions  Take over-the-counter and prescription medicines only as told by your health care provider.  Keep all follow-up visits as told by your health care provider. This is important. Where to find more information  American Heart Association: www.heart.org  National Heart, Lung, and Blood Institute: https://wilson-eaton.com/ Contact a health care provider if:  You have trouble achieving or maintaining a healthy diet or weight.  You are starting an exercise program.  You are unable to stop smoking. Get help right away if:  You have chest pain.  You have trouble breathing.  You have any symptoms of a stroke. "BE FAST" is an easy way to remember the main warning signs of a stroke: ? B - Balance. Signs are dizziness, sudden trouble walking, or loss of balance. ? E - Eyes. Signs are trouble seeing or a sudden change in vision. ? F - Face. Signs are sudden weakness or numbness of the face, or the face or eyelid drooping on one side. ? A - Arms. Signs are weakness or numbness in an arm. This happens suddenly and usually on one side of the body. ? S - Speech. Signs are sudden trouble speaking, slurred speech, or trouble understanding what people say. ? T - Time. Time to call emergency services. Write down what time symptoms started.  You have other signs of a stroke, such as: ? A sudden, severe headache with no known cause. ? Nausea or vomiting. ? Seizure. These symptoms may represent a serious problem that is an emergency. Do not wait to see if the symptoms will go away. Get medical help right away. Call your local emergency services (911 in the [place] Do not drive yourself to the  hospital. Summary  Cholesterol plaques increase your risk for heart attack and stroke. Work with your health care provider to keep your cholesterol levels in a healthy range.  Eat a healthy, balanced diet, get regular exercise, and maintain a healthy weight.  Do not use any products that contain nicotine or tobacco, such as cigarettes, e-cigarettes, and chewing tobacco.  Get help right away if you have any symptoms of a stroke. This information is not intended to replace advice given to you by your health care provider. Make sure you discuss any questions you have with your health care provider. Document Revised: 05/25/2019 Document Reviewed: 05/25/2019 Elsevier Patient Education  2021 [place].  High Cholesterol  High cholesterol is a condition in which the blood has high levels of a white, waxy substance similar to fat (cholesterol). The liver makes all the cholesterol that the body needs. The human body needs small amounts of cholesterol to help build cells. A person gets extra or excess cholesterol from the food that he or she eats. The blood carries cholesterol from the liver to the rest of the body. If you have high cholesterol, deposits (  plaques) may build up on the walls of your arteries. Arteries are the blood vessels that carry blood away from your heart. These plaques make the arteries narrow and stiff. Cholesterol plaques increase your risk for heart attack and stroke. Work with your health care provider to keep your cholesterol levels in a healthy range. What increases the risk? The following factors may make you more likely to develop this condition:  Eating foods that are high in animal fat (saturated fat) or cholesterol.  Being overweight.  Not getting enough exercise.  A family history of high cholesterol (familial hypercholesterolemia).  Use of tobacco products.  Having diabetes. What are the signs or symptoms? There are no symptoms of this condition. How is  this diagnosed? This condition may be diagnosed based on the results of a blood test.  If you are older than 55 years of age, your health care provider may check your cholesterol levels every 4-6 years.  You may be checked more often if you have high cholesterol or other risk factors for heart disease. The blood test for cholesterol measures:  "Bad" cholesterol, or LDL cholesterol. This is the main type of cholesterol that causes heart disease. The desired level is less than 100 mg/dL.  "Good" cholesterol, or HDL cholesterol. HDL helps protect against heart disease by cleaning the arteries and carrying the LDL to the liver for processing. The desired level for HDL is 60 mg/dL or higher.  Triglycerides. These are fats that your body can store or burn for energy. The desired level is less than 150 mg/dL.  Total cholesterol. This measures the total amount of cholesterol in your blood and includes LDL, HDL, and triglycerides. The desired level is less than 200 mg/dL. How is this treated? This condition may be treated with:  Diet changes. You may be asked to eat foods that have more fiber and less saturated fats or added sugar.  Lifestyle changes. These may include regular exercise, maintaining a healthy weight, and quitting use of tobacco products.  Medicines. These are given when diet and lifestyle changes have not worked. You may be prescribed a statin medicine to help lower your cholesterol levels. Follow these instructions at home: Eating and drinking  Eat a healthy, balanced diet. This diet includes: ? Daily servings of a variety of fresh, frozen, or canned fruits and vegetables. ? Daily servings of whole grain foods that are rich in fiber. ? Foods that are low in saturated fats and trans fats. These include poultry and fish without skin, lean cuts of meat, and low-fat dairy products. ? A variety of fish, especially oily fish that contain omega-3 fatty acids. Aim to eat fish at least 2  times a week.  Avoid foods and drinks that have added sugar.  Use healthy cooking methods, such as roasting, grilling, broiling, baking, poaching, steaming, and stir-frying. Do not fry your food except for stir-frying.   Lifestyle  Get regular exercise. Aim to exercise for a total of 150 minutes a week. Increase your activity level by doing activities such as gardening, walking, and taking the stairs.  Do not use any products that contain nicotine or tobacco, such as cigarettes, e-cigarettes, and chewing tobacco. If you need help quitting, ask your health care provider.   General instructions  Take over-the-counter and prescription medicines only as told by your health care provider.  Keep all follow-up visits as told by your health care provider. This is important. Where to find more information  American Heart Association: www.heart.org  National Heart, Lung, and Blood Institute: https://wilson-eaton.com/ Contact a health care provider if:  You have trouble achieving or maintaining a healthy diet or weight.  You are starting an exercise program.  You are unable to stop smoking. Get help right away if:  You have chest pain.  You have trouble breathing.  You have any symptoms of a stroke. "BE FAST" is an easy way to remember the main warning signs of a stroke: ? B - Balance. Signs are dizziness, sudden trouble walking, or loss of balance. ? E - Eyes. Signs are trouble seeing or a sudden change in vision. ? F - Face. Signs are sudden weakness or numbness of the face, or the face or eyelid drooping on one side. ? A - Arms. Signs are weakness or numbness in an arm. This happens suddenly and usually on one side of the body. ? S - Speech. Signs are sudden trouble speaking, slurred speech, or trouble understanding what people say. ? T - Time. Time to call emergency services. Write down what time symptoms started.  You have other signs of a stroke, such as: ? A sudden, severe headache with  no known cause. ? Nausea or vomiting. ? Seizure. These symptoms may represent a serious problem that is an emergency. Do not wait to see if the symptoms will go away. Get medical help right away. Call your local emergency services (911 in the [place] Do not drive yourself to the hospital. Summary  Cholesterol plaques increase your risk for heart attack and stroke. Work with your health care provider to keep your cholesterol levels in a healthy range.  Eat a healthy, balanced diet, get regular exercise, and maintain a healthy weight.  Do not use any products that contain nicotine or tobacco, such as cigarettes, e-cigarettes, and chewing tobacco.  Get help right away if you have any symptoms of a stroke. This information is not intended to replace advice given to you by your health care provider. Make sure you discuss any questions you have with your health care provider. Document Revised: 05/25/2019 Document Reviewed: 05/25/2019 Elsevier Patient Education  2021 [employer].

## 2020-10-03 ENCOUNTER — Telehealth: Payer: Self-pay | Admitting: Internal Medicine

## 2020-10-03 NOTE — Telephone Encounter (Signed)
Patient informed and verbalized understanding.   States she had a steroid injections in the back before labs. Patient states she is having slight sinus symptoms. Pressure behind the eyes and cheekbones, headaches. States she has been taking OTC sinus medication but this did not help. Patient states she blows he nose and noting comes out.   Patient already scheduled for follow up labs

## 2020-10-03 NOTE — Telephone Encounter (Signed)
Patient returned office phone call for lab results. 

## 2020-10-03 NOTE — Telephone Encounter (Signed)
Nino Glow McLean-Scocuzza, MD  09/23/2020  5:07 PM EDT      Urine normal Thyroid lab normal  Cholesterol worse  -mail cholesterol handout see ref range for cholesterol goals total <200 goal and LDL 100-130  Liver kidney snormal  Blood cts monocytes elevated  -is she feeling well any URI sx's? UTI   -Charda Janis order and sch repeat CBC with diff in 1 month  -if does not improve consider blood specialist

## 2020-10-04 ENCOUNTER — Other Ambulatory Visit: Payer: Self-pay | Admitting: Internal Medicine

## 2020-10-04 DIAGNOSIS — J321 Chronic frontal sinusitis: Secondary | ICD-10-CM

## 2020-10-04 MED ORDER — AZITHROMYCIN 250 MG PO TABS: ORAL_TABLET | ORAL | 0 refills | Status: DC

## 2020-10-04 NOTE — Telephone Encounter (Signed)
She can try nasal saline, flonase over the counter  Mucinex or robitussin dm  Otc allergy medication zyrtec, allegra, claritin or xyzal  I sent in zpack  Tylenol for headaches as needed

## 2020-10-06 NOTE — Telephone Encounter (Signed)
Spoke with pt., she verbalized understanding.

## 2020-10-06 NOTE — Telephone Encounter (Signed)
Please call.

## 2020-10-07 ENCOUNTER — Other Ambulatory Visit: Payer: 59

## 2020-10-11 ENCOUNTER — Ambulatory Visit: Payer: 59 | Admitting: Internal Medicine

## 2020-10-17 ENCOUNTER — Encounter: Payer: Self-pay | Admitting: Internal Medicine

## 2020-10-19 ENCOUNTER — Telehealth (INDEPENDENT_AMBULATORY_CARE_PROVIDER_SITE_OTHER): Payer: 59 | Admitting: Psychiatry

## 2020-10-19 ENCOUNTER — Other Ambulatory Visit: Payer: Self-pay

## 2020-10-19 ENCOUNTER — Encounter: Payer: Self-pay | Admitting: Psychiatry

## 2020-10-19 DIAGNOSIS — F411 Generalized anxiety disorder: Secondary | ICD-10-CM

## 2020-10-19 DIAGNOSIS — G4701 Insomnia due to medical condition: Secondary | ICD-10-CM | POA: Diagnosis not present

## 2020-10-19 MED ORDER — HYDROXYZINE HCL 25 MG PO TABS: 25.0000 mg | ORAL_TABLET | Freq: Every day | ORAL | 1 refills | Status: DC | PRN

## 2020-10-19 NOTE — Progress Notes (Signed)
Virtual Visit via Video Note  I connected with Maureen Randall on 10/19/20 at  1:20 PM EDT by a video enabled telemedicine application and verified that I am speaking with the correct person using two identifiers.  Location Provider Location : [facility] Patient Location : Home  Participants: Patient , Provider    I discussed the limitations of evaluation and management by telemedicine and the availability of in person appointments. The patient expressed understanding and agreed to proceed.   I discussed the assessment and treatment plan with the patient. The patient was provided Maureen opportunity to ask questions and all were answered. The patient agreed with the plan and demonstrated Maureen understanding of the instructions.   The patient was advised to call back or seek Maureen in-person evaluation if the symptoms worsen or if the condition fails to improve as anticipated.  [place] MD OP Progress Note  10/19/2020 3:31 PM Maureen Randall  MRN:  [id number]  Chief Complaint:  Chief Complaint    Follow-up; Anxiety     HPI: Maureen Randall is a 55 year old Caucasian female who has a history of GAD, insomnia, chronic pain, primary osteoarthritis, lumbar radiculopathy, degenerative disc disease, spondylolisthesis, bilateral carpal tunnel syndrome was evaluated by telemedicine today.  Patient reports she continues to be in a lot of pain.  She reports she is trying to get relief from that.  She is currently working with her providers on the same.  She had multiple steroid injection and that has not helped.  She continues to be on multiple medications including Lyrica, Robaxin.  Patient reports the hydroxyzine 10 mg did not help with her anxiety attacks.  She does not have a lot of anxiety all the time however she does have episodes when she feels she has these anxiety attacks when she feels extremely nervous and restless and goes into a panic.  Patient continues to be compliant on the Cymbalta.  Denies side  effects.  Reports she is able to sleep 4 hours which is good for her on the combination of trazodone and melatonin.  She however reports she does have back pain which wakes her up.  Patient denies any suicidality, homicidality or perceptual disturbances.  Patient denies any other concerns today.  Visit Diagnosis:    ICD-10-CM   1. Generalized anxiety disorder  F41.1 hydrOXYzine (ATARAX/VISTARIL) 25 MG tablet  2. Insomnia due to medical condition  G47.01    anxiety,pain    Past Psychiatric History: I have reviewed past psychiatric history from my progress note on 04/30/2018  Past Medical History:  Past Medical History:  Diagnosis Date  . Anxiety   . Arthritis    osteoarthritis-joints, feet, shoulder  . Asthma    previously diagnosed after a bout of bronchitis/ no problems since  . Bursitis    b/l hips  . Chronic pain syndrome   . COPD (chronic obstructive pulmonary disease) ([place]) 2004   patient states probably bronchitis  . Depression   . DVT (deep venous thrombosis) ([place]) 2012   right leg. after surgery, while on BCP  . GERD (gastroesophageal reflux disease)   . Hyperlipidemia   . Neuropathy    post surgery  . PVD (peripheral vascular disease) ([place])   . Spondylolisthesis     Past Surgical History:  Procedure Laterality Date  . ABLATION     veins in lower legs  . BACK SURGERY  07/2014   lumbar spine/ fusion 1 level  . BREAST BIOPSY Left 2011   neg  .  CARPAL TUNNEL RELEASE Right 12/2015   [facility]  . CARPAL TUNNEL RELEASE Left 12/2016  . COLONOSCOPY WITH PROPOFOL N/A 02/29/2020   Procedure: COLONOSCOPY WITH PROPOFOL;  Surgeon: [name], MD;  Location: [facility];  Service: Endoscopy;  Laterality: N/A;  . ESOPHAGOGASTRODUODENOSCOPY (EGD) WITH PROPOFOL N/A 02/29/2020   Procedure: ESOPHAGOGASTRODUODENOSCOPY (EGD) WITH PROPOFOL;  Surgeon: [name], MD;  Location: [facility];  Service: Endoscopy;  Laterality: N/A;  . FOOT SURGERY Right 2015    broken navicular bone  . HIP ARTHROSCOPY Right 05/2011  . HIP SURGERY     left hip surgery 05/2018  . JOINT REPLACEMENT Bilateral 12/2017 & 05/2018   hip  . L5-S1 OLIF Dr. Rennis Harding back surgery 09/2019  09/28/2019  . left ganglion cyst removal     12/23/19 Dr. Elvina Mattes  . MASS EXCISION Left 12/17/2019   Procedure: EXCISION TUMOR FOOT DEEP LEFT;  Surgeon: Albertine Patricia, DPM;  Location: [facility];  Service: Podiatry;  Laterality: Left;  LMA W/LOCAL  . NASAL TURBINATE REDUCTION Bilateral 06/12/2019   Procedure: TURBINATE REDUCTION/SUBMUCOSAL RESECTION;  Surgeon: [name], MD;  Location: [facility];  Service: ENT;  Laterality: Bilateral;  . 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 ROTATOR CUFF REPAIR     Dr. [name] in 04/2019   . SCLEROTHERAPY     veins in lower legs  . SEPTOPLASTY Bilateral 06/12/2019   Procedure: SEPTOPLASTY;  Surgeon: [name], MD;  Location: [facility];  Service: ENT;  Laterality: Bilateral;  . SHOULDER SURGERY Right 12/2015   [facility] torn rotator cuff  . SPINAL FUSION  2016   [facility] lumbar    Family Psychiatric History: Reviewed family psychiatric history from my progress note on 04/30/2018  Family History:  Family History  Problem Relation Age of Onset  . Breast cancer Maternal Aunt   . Breast cancer Paternal Aunt   . Dementia Paternal Aunt   . COPD Mother        quit smoking after 60 years  . Arthritis Mother   . Osteoarthritis Mother   . Dementia Father        symptoms started in his 67s  . Alzheimer's disease Father   . Rheum arthritis Father   . Rheum arthritis Other        FH  . Dementia Other        "all my dad's side of the family"  . Other Maternal Grandmother        got dementia as she got older   . Dementia Paternal Grandmother     Social History: Reviewed social history from my progress note on 04/30/2018 Social History   Socioeconomic  History  . Marital status: Married    Spouse name: fredrick  . Number of children: 1  . Years of education: Not on file  . Highest education level: High school graduate  Occupational History    Comment: full time  Tobacco Use  . Smoking status: Never Smoker  . Smokeless tobacco: Never Used  Vaping Use  . Vaping Use: Never used  Substance and Sexual Activity  . Alcohol use: Yes    Alcohol/week: 1.0 standard drink    Types: 1 Cans of beer per week    Comment: may have drink 1x/month  . Drug use: Never  . Sexual activity: Yes  Other Topics Concern  . 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 Amoxicillin Hives  . Septra [Sulfamethoxazole-Trimethoprim] Hives       . Cephalexin Rash         Metabolic Disorder Labs: Lab Results  Component Value Date   HGBA1C 5.5 09/25/2019   No results found for: PROLACTIN Lab Results  Component Value Date   CHOL 231 (H) 09/22/2020   TRIG 90.0 09/22/2020   HDL 61.90 09/22/2020   CHOLHDL 4 09/22/2020   VLDL 18.0 09/22/2020   LDLCALC 151 (H) 09/22/2020   LDLCALC 132 (H) 09/25/2019   Lab Results  Component Value Date   TSH 1.49 09/22/2020   TSH 3.78 01/05/2019    Therapeutic Level Labs: No results found for: LITHIUM No results found for: VALPROATE No components found for:  CBMZ  Current Medications: Current Outpatient Medications  Medication Sig Dispense Refill  . hydrOXYzine (ATARAX/VISTARIL) 25 MG tablet Take 1-2 tablets (25-50 mg total) by mouth daily as needed for anxiety. 60 tablet 1  .  azithromycin (ZITHROMAX) 250 MG tablet 2 pills day 1 and 1 pill day 2-5 with food 6 tablet 0  . baclofen (LIORESAL) 10 MG tablet Take 10 mg by mouth 3 (three) times daily as needed for muscle spasms.    [name] dexlansoprazole (DEXILANT) 60 MG capsule Take 1 capsule (60 mg total) by mouth daily. 30 capsule 6  . diclofenac Sodium (VOLTAREN) 1 % GEL Apply 2-4 g topically 4 (four) times daily. 2 gram upper body qid prn and 4 gram lower body qid prn 100 g 11  . DULoxetine (CYMBALTA) 60 MG capsule TAKE 1 CAPSULE BY MOUTH EVERY DAY 90 capsule 1  . Elastic Bandages & Supports (RELIEF KNEE) MISC Apply in the morning and remove at night.    . estradiol (ESTRACE) 0.1 MG/GM vaginal cream INSERT PEA SIZED AMOUNT (0.5G) INTO VAGINA NIGHTLY EVERY OTHER NIGHT FOR 2 WEEKS, THEN TWICE WEEKLY    . famotidine (PEPCID) 20 MG tablet Take 1 tablet (20 mg total) by mouth at bedtime. 90 tablet 0  . fluticasone (FLONASE) 50 MCG/ACT nasal spray Place 1-2 sprays into both nostrils daily as needed. Max dose 2 sprays each nostril 16 g 12  . linaclotide (LINZESS) 145 MCG CAPS capsule Take 1 capsule (145 mcg total) by mouth daily before breakfast. 30 capsule 0  . Melatonin 3 MG CAPS Take by mouth.    . methocarbamol (ROBAXIN) 500 MG tablet Take 2 tablets (1,000 mg total) by mouth 2 (two) times daily as needed for muscle spasms. 60 tablet 2  . ondansetron (ZOFRAN ODT) 8 MG disintegrating tablet Take 1 tablet (8 mg total) by mouth every 8 (eight) hours as needed for nausea or vomiting. 40 tablet 0  . oxyCODONE-acetaminophen (PERCOCET) 10-325 MG tablet Take 1 tablet by mouth every 6 (six) hours as needed.    . phentermine (ADIPEX-P) 37.5 MG tablet Take 1 tablet (37.5 mg total) by mouth daily before breakfast. rx 1/2 60 tablet 0  . pregabalin (LYRICA) 50 MG capsule TAKE 1 TO 2 CAPSULES BY MOUTH  EVERY 12 HOURS AS NEEDED    . traZODone (DESYREL) 100 MG tablet Take 2 tablets (200 mg total) by mouth at bedtime as needed for sleep. 180 tablet 1   . valACYclovir (VALTREX) 1000 MG tablet as needed. Oral blister     No current facility-administered medications for this visit.     Musculoskeletal: Strength & Muscle Tone: UTA Gait & Station: UTA Patient leans: N/A  Psychiatric Specialty Exam: Review of Systems  Psychiatric/Behavioral: The patient is nervous/anxious.   All other systems reviewed and are negative.   There were no vitals taken for this visit.There is no height or weight on file to calculate BMI.  General Appearance: Casual  Eye Contact:  Fair  Speech:  Clear and Coherent  Volume:  Normal  Mood:  Anxious  Affect:  Appropriate  Thought Process:  Goal Directed and Descriptions of Associations: Intact  Orientation:  Full (Time, Place, and Person)  Thought Content: Logical   Suicidal Thoughts:  No  Homicidal Thoughts:  No  Memory:  Immediate;   Fair Recent;   Fair Remote;   Fair  Judgement:  Fair  Insight:  Fair  Psychomotor Activity:  Normal  Concentration:  Concentration: Fair and Attention Span: Fair  Recall:  [employer] of Knowledge: Fair  Language: Fair  Akathisia:  No  Handed:  Right  AIMS (if indicated): UTA  Assets:  Communication Skills Desire for Improvement Housing Social Support  ADL's:  Intact  Cognition: WNL  Sleep:  Improving   Screenings: GAD-7   Flowsheet Row Video Visit from 10/19/2020 in [facility] Video Visit from 09/21/2020 in [facility] Video Visit from 10/14/2019 in [facility]  Total GAD-7 Score 2 4 1     [place] Visit from 09/23/2019 in [facility]  Total Score (max 30 points ) 27    PHQ2-9   Flowsheet Row Video Visit from 09/21/2020 in [facility] Video Visit from 10/14/2019 in [facility] Office Visit from 08/14/2019 in [place] Office Visit from 07/07/2019 in [facility] Office Visit from 03/13/2019 in [facility]  PHQ-2 Total Score 1 0 0 0 0  PHQ-9 Total Score -- -- 0 -- 0    Flowsheet Row Video Visit from 09/21/2020 in [facility] CATEGORY No Risk       Assessment and Plan: Maureen Randall is a 55 year old Caucasian female, married, currently on SSD, lives in [place], has a history of GAD, insomnia, chronic pain osteoarthritis, degenerative disc disease, lumbar radiculopathy, bilateral carpal tunnel syndrome, multiple surgery including recent left foot surgery was evaluated by telemedicine today.  Patient continues to report anxiety attacks and reports hydroxyzine at this dosage is not beneficial.  Plan as noted below.  Plan GAD-improving GAD 7 today equals 2 Continue Cymbalta 60 mg p.o. daily However will increase hydroxyzine to 25-50 mg p.o. daily as needed for severe anxiety attacks Patient advised about drug to drug interaction with medications like Lyrica, Robaxin and oxycodone.  Insomnia-improving Sleep problems likely due to pain.  She will benefit from sufficient pain management.  She will continue to follow up with her pain provider. Trazodone 150-200 mg p.o. nightly as needed Melatonin 3 mg p.o. nightly  Follow-up in clinic in 6 to 8 weeks or sooner if needed.  This note was generated in part or whole with voice recognition software. Voice  recognition is usually quite accurate but there are transcription errors that can and very often do occur. I apologize for any typographical errors that were not detected and corrected.        Ursula Alert, MD 10/19/2020, 3:31 PM

## 2020-10-25 ENCOUNTER — Other Ambulatory Visit: Payer: Self-pay

## 2020-10-25 ENCOUNTER — Ambulatory Visit (INDEPENDENT_AMBULATORY_CARE_PROVIDER_SITE_OTHER): Payer: 59

## 2020-10-25 DIAGNOSIS — Z23 Encounter for immunization: Secondary | ICD-10-CM

## 2020-10-25 MED ORDER — CYANOCOBALAMIN 1000 MCG/ML IJ SOLN: 1000.0000 ug | Freq: Once | INTRAMUSCULAR | Status: DC

## 2020-10-25 NOTE — Progress Notes (Signed)
Patient presented for Shingrix injection to left deltoid, patient voiced no concerns nor showed any signs of distress during injection.

## 2020-10-26 ENCOUNTER — Ambulatory Visit: Payer: 59 | Admitting: Internal Medicine

## 2020-11-03 DIAGNOSIS — S43431A Superior glenoid labrum lesion of right shoulder, initial encounter: Secondary | ICD-10-CM | POA: Insufficient documentation

## 2020-11-03 HISTORY — DX: Superior glenoid labrum lesion of right shoulder, initial encounter: S43.431A

## 2020-11-05 ENCOUNTER — Encounter: Payer: Self-pay | Admitting: Internal Medicine

## 2020-11-05 ENCOUNTER — Other Ambulatory Visit: Payer: Self-pay | Admitting: Internal Medicine

## 2020-11-05 DIAGNOSIS — R112 Nausea with vomiting, unspecified: Secondary | ICD-10-CM

## 2020-11-07 ENCOUNTER — Other Ambulatory Visit: Payer: Self-pay

## 2020-11-07 DIAGNOSIS — R112 Nausea with vomiting, unspecified: Secondary | ICD-10-CM

## 2020-11-07 MED ORDER — ONDANSETRON 8 MG PO TBDP: 8.0000 mg | ORAL_TABLET | Freq: Three times a day (TID) | ORAL | 0 refills | Status: DC | PRN

## 2020-11-09 ENCOUNTER — Other Ambulatory Visit: Payer: Self-pay | Admitting: Internal Medicine

## 2020-11-09 DIAGNOSIS — R112 Nausea with vomiting, unspecified: Secondary | ICD-10-CM

## 2020-11-10 ENCOUNTER — Encounter: Payer: Self-pay | Admitting: Internal Medicine

## 2020-11-10 ENCOUNTER — Other Ambulatory Visit: Payer: Self-pay | Admitting: Internal Medicine

## 2020-11-10 DIAGNOSIS — R112 Nausea with vomiting, unspecified: Secondary | ICD-10-CM

## 2020-11-15 ENCOUNTER — Other Ambulatory Visit: Payer: Self-pay | Admitting: Internal Medicine

## 2020-11-15 DIAGNOSIS — R112 Nausea with vomiting, unspecified: Secondary | ICD-10-CM

## 2020-11-16 ENCOUNTER — Other Ambulatory Visit: Payer: Self-pay | Admitting: Internal Medicine

## 2020-11-16 DIAGNOSIS — R112 Nausea with vomiting, unspecified: Secondary | ICD-10-CM

## 2020-11-29 ENCOUNTER — Telehealth: Payer: Self-pay

## 2020-11-29 NOTE — Telephone Encounter (Signed)
I called patien to let her know that Zofran dissolving tablets not approved by insurance. She stated that she just had surgery Monday & insurance approved 14 day supply at [facility]. She asked if you would send regular Zofran tabs 90 day supply to [facility] so insurance would pay. She should be ale to fill after 14 day supply she has now.

## 2020-11-30 ENCOUNTER — Other Ambulatory Visit: Payer: Self-pay

## 2020-11-30 ENCOUNTER — Encounter: Payer: Self-pay | Admitting: Psychiatry

## 2020-11-30 ENCOUNTER — Telehealth (INDEPENDENT_AMBULATORY_CARE_PROVIDER_SITE_OTHER): Payer: 59 | Admitting: Psychiatry

## 2020-11-30 DIAGNOSIS — F411 Generalized anxiety disorder: Secondary | ICD-10-CM | POA: Diagnosis not present

## 2020-11-30 DIAGNOSIS — N951 Menopausal and female climacteric states: Secondary | ICD-10-CM | POA: Insufficient documentation

## 2020-11-30 DIAGNOSIS — G4701 Insomnia due to medical condition: Secondary | ICD-10-CM

## 2020-11-30 HISTORY — DX: Menopausal and female climacteric states: N95.1

## 2020-11-30 MED ORDER — TRAZODONE HCL 100 MG PO TABS: 200.0000 mg | ORAL_TABLET | Freq: Every evening | ORAL | 1 refills | Status: DC | PRN

## 2020-11-30 MED ORDER — DULOXETINE HCL 60 MG PO CPEP
ORAL_CAPSULE | ORAL | 1 refills | Status: DC
Start: 2020-11-30 — End: 2021-05-16

## 2020-11-30 NOTE — Progress Notes (Signed)
Virtual Visit via Video Note  I connected with [name] on 11/30/20 at 10:20 AM EDT by a video enabled telemedicine application and verified that I am speaking with the correct person using two identifiers. Location Provider Location : [facility] Patient Location : Home  Participants: Patient , Provider    I discussed the limitations of evaluation and management by telemedicine and the availability of in person appointments. The patient expressed understanding and agreed to proceed.    I discussed the assessment and treatment plan with the patient. The patient was provided an opportunity to ask questions and all were answered. The patient agreed with the plan and demonstrated an understanding of the instructions.   The patient was advised to call back or seek an in-person evaluation if the symptoms worsen or if the condition fails to improve as anticipated.   Elizabeth MD OP Progress Note  11/30/2020 10:42 AM [name]  MRN:  [id number]  Chief Complaint:  Chief Complaint    Follow-up; Anxiety; Depression; Insomnia     HPI: Maureen Randall is a 55 year old Caucasian female who has a history of GAD, insomnia, chronic pain, primary osteoarthritis, lumbar radiculopathy, degenerative disc disease, spondylolisthesis, bilateral carpal tunnel syndrome was evaluated by telemedicine today.  Patient today reports she had her right shoulder surgery on Monday.  She reports once the nerve block wore off she started having severe pain.  She reports she currently rates her pain at an 8 out of 10, 10 being the worst.  She is currently on pain medications.  She will start physical therapy next week.  The pain does have an impact on her mood and sleep.  She is trying to sleep on a recliner and also wears a sling all the time.  She reports she does have anxiety about the current physical problems and her pain.  She did take the hydroxyzine as needed a few times after her last visit.  It takes the edge off  however it makes her drowsy.  She is compliant on Cymbalta.  Patient denies any suicidality, homicidality or perceptual disturbances.  Patient denies any other concerns today.  Visit Diagnosis:    ICD-10-CM   1. GAD (generalized anxiety disorder)  F41.1 traZODone (DESYREL) 100 MG tablet  2. Insomnia due to medical condition  G47.01 DULoxetine (CYMBALTA) 60 MG capsule   pain    Past Psychiatric History: I have reviewed past psychiatric history from progress note on 04/30/2018  Past Medical History:  Past Medical History:  Diagnosis Date  . Anxiety   . Arthritis    osteoarthritis-joints, feet, shoulder  . Asthma    previously diagnosed after a bout of bronchitis/ no problems since  . Bursitis    b/l hips  . Chronic pain syndrome   . COPD (chronic obstructive pulmonary disease) ([place]) 2004   patient states probably bronchitis  . Depression   . DVT (deep venous thrombosis) ([place]) 2012   right leg. after surgery, while on BCP  . GERD (gastroesophageal reflux disease)   . Hyperlipidemia   . Neuropathy    post surgery  . PVD (peripheral vascular disease) ([place])   . Spondylolisthesis     Past Surgical History:  Procedure Laterality Date  . ABLATION     veins in lower legs  . BACK SURGERY  07/2014   lumbar spine/ fusion 1 level  . BREAST BIOPSY Left 2011   neg  . CARPAL TUNNEL RELEASE Right 12/2015   [facility]  . CARPAL  TUNNEL RELEASE Left 12/2016  . COLONOSCOPY WITH PROPOFOL N/A 02/29/2020   Procedure: COLONOSCOPY WITH PROPOFOL;  Surgeon: [name], MD;  Location: [facility];  Service: Endoscopy;  Laterality: N/A;  . ESOPHAGOGASTRODUODENOSCOPY (EGD) WITH PROPOFOL N/A 02/29/2020   Procedure: ESOPHAGOGASTRODUODENOSCOPY (EGD) WITH PROPOFOL;  Surgeon: [name], MD;  Location: [facility];  Service: Endoscopy;  Laterality: N/A;  . FOOT SURGERY Right 2015   broken navicular bone  . HIP ARTHROSCOPY Right 05/2011  . HIP SURGERY     left hip surgery  05/2018  . JOINT REPLACEMENT Bilateral 12/2017 & 05/2018   hip  . L5-S1 OLIF Dr. Rennis Harding back surgery 09/2019  09/28/2019  . left ganglion cyst removal     12/23/19 Dr. Elvina Mattes  . MASS EXCISION Left 12/17/2019   Procedure: EXCISION TUMOR FOOT DEEP LEFT;  Surgeon: Albertine Patricia, DPM;  Location: [facility];  Service: Podiatry;  Laterality: Left;  LMA W/LOCAL  . NASAL TURBINATE REDUCTION Bilateral 06/12/2019   Procedure: TURBINATE REDUCTION/SUBMUCOSAL RESECTION;  Surgeon: [name], MD;  Location: [facility];  Service: ENT;  Laterality: Bilateral;  . 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 ROTATOR CUFF REPAIR     Dr. [name] in 04/2019   . SCLEROTHERAPY     veins in lower legs  . SEPTOPLASTY Bilateral 06/12/2019   Procedure: SEPTOPLASTY;  Surgeon: [name], MD;  Location: [facility];  Service: ENT;  Laterality: Bilateral;  . SHOULDER SURGERY Right 12/2015   [facility] torn rotator cuff  . SPINAL FUSION  2016   [facility] lumbar    Family Psychiatric History: I have reviewed family psychiatric history from progress note on 04/30/2018  Family History:  Family History  Problem Relation Age of Onset  . Breast cancer Maternal Aunt   . Breast cancer Paternal Aunt   . Dementia Paternal Aunt   . COPD Mother        quit smoking after 60 years  . Arthritis Mother   . Osteoarthritis Mother   . Dementia Father        symptoms started in his 19s  . Alzheimer's disease Father   . Rheum arthritis Father   . Rheum arthritis Other        FH  . Dementia Other        "all my dad's side of the family"  . Other Maternal Grandmother        got dementia as she got older   . Dementia Paternal Grandmother     Social History: Reviewed social history from progress note on 04/30/2018 Social History   Socioeconomic History  . Marital status: Married    Spouse name: fredrick  . Number of children: 1  . Years  of education: Not on file  . Highest education level: High school graduate  Occupational History    Comment: full time  Tobacco Use  . Smoking status: Never Smoker  . Smokeless tobacco: Never Used  Vaping Use  . Vaping Use: Never used  Substance and Sexual Activity  . Alcohol use: Yes    Alcohol/week: 1.0 standard drink    Types: 1 Cans of beer per week    Comment: may have drink 1x/month  . Drug use: Never  . Sexual activity: Yes  Other Topics Concern  . 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 Amoxicillin Hives  . Septra [Sulfamethoxazole-Trimethoprim] Hives       . Cephalexin Rash         Metabolic Disorder Labs: Lab Results  Component Value Date   HGBA1C 5.5 09/25/2019   No results found for: PROLACTIN Lab Results  Component Value Date   CHOL 231 (H) 09/22/2020   TRIG 90.0 09/22/2020   HDL 61.90 09/22/2020   CHOLHDL 4 09/22/2020   VLDL 18.0 09/22/2020   LDLCALC 151 (H) 09/22/2020   LDLCALC 132 (H) 09/25/2019   Lab Results  Component Value Date   TSH 1.49 09/22/2020   TSH 3.78 01/05/2019    Therapeutic Level Labs: No results found for: LITHIUM No results found for: VALPROATE No components found for:  CBMZ  Current Medications: Current Outpatient Medications  Medication Sig Dispense Refill  . azithromycin (ZITHROMAX) 250 MG tablet 2 pills day 1 and 1 pill day 2-5 with food (Patient not taking: Reported on 11/30/2020) 6 tablet 0  . baclofen (LIORESAL) 10 MG tablet Take 10 mg by mouth 3 (three) times daily as needed for muscle  spasms.    [name] dexlansoprazole (DEXILANT) 60 MG capsule Take 1 capsule (60 mg total) by mouth daily. 30 capsule 6  . diclofenac Sodium (VOLTAREN) 1 % GEL Apply 2-4 g topically 4 (four) times daily. 2 gram upper body qid prn and 4 gram lower body qid prn 100 g 11  . DULoxetine (CYMBALTA) 60 MG capsule TAKE 1 CAPSULE BY MOUTH EVERY DAY 90 capsule 1  . Elastic Bandages & Supports (RELIEF KNEE) MISC Apply in the morning and remove at night.    . estradiol (ESTRACE) 0.1 MG/GM vaginal cream INSERT PEA SIZED AMOUNT (0.5G) INTO VAGINA NIGHTLY EVERY OTHER NIGHT FOR 2 WEEKS, THEN TWICE WEEKLY    . famotidine (PEPCID) 20 MG tablet Take 1 tablet (20 mg total) by mouth at bedtime. 90 tablet 0  . fluticasone (FLONASE) 50 MCG/ACT nasal spray Place 1-2 sprays into both nostrils daily as needed. Max dose 2 sprays each nostril 16 g 12  . hydrOXYzine (ATARAX/VISTARIL) 25 MG tablet Take 1-2 tablets (25-50 mg total) by mouth daily as needed for anxiety. 60 tablet 1  . linaclotide (LINZESS) 145 MCG CAPS capsule Take 1 capsule (145 mcg total) by mouth daily before breakfast. 30 capsule 0  . Melatonin 3 MG CAPS Take by mouth.    . meloxicam (MOBIC) 15 MG tablet meloxicam 15 mg tablet    . methocarbamol (ROBAXIN) 500 MG tablet Take 2 tablets (1,000 mg total) by mouth 2 (two) times daily as needed for muscle spasms. 60 tablet 2  . ondansetron (ZOFRAN-ODT) 8 MG disintegrating tablet TAKE 1 TABLET BY MOUTH EVERY 8 HOURS AS NEEDED FOR NAUSEA AND VOMITING**PA PENDING 270 tablet 0  . oxyCODONE-acetaminophen (PERCOCET) 10-325 MG tablet Take 1 tablet by mouth every 6 (six) hours as needed.    . phentermine (ADIPEX-P) 37.5 MG tablet Take 1 tablet (37.5 mg total) by mouth daily before breakfast. rx 1/2 60 tablet 0  . pregabalin (LYRICA) 50 MG capsule TAKE 1 TO 2 CAPSULES BY  MOUTH EVERY 12 HOURS AS NEEDED    . traZODone (DESYREL) 100 MG tablet Take 2 tablets (200 mg total) by mouth at bedtime as needed for sleep. 180 tablet 1  .  valACYclovir (VALTREX) 1000 MG tablet as needed. Oral blister     No current facility-administered medications for this visit.     Musculoskeletal: Strength & Muscle Tone: UTA Gait & Station: UTA Patient leans: N/A  Psychiatric Specialty Exam: Review of Systems  Musculoskeletal:       Rt.sided shoulder pain - S/P surgery  Psychiatric/Behavioral: Positive for sleep disturbance. The patient is nervous/anxious.   All other systems reviewed and are negative.   There were no vitals taken for this visit.There is no height or weight on file to calculate BMI.  General Appearance: Casual  Eye Contact:  Fair  Speech:  Clear and Coherent  Volume:  Normal  Mood:  Anxious  Affect:  Congruent  Thought Process:  Goal Directed and Descriptions of Associations: Intact  Orientation:  Full (Time, Place, and Person)  Thought Content: Logical   Suicidal Thoughts:  No  Homicidal Thoughts:  No  Memory:  Immediate;   Fair Recent;   Fair Remote;   Fair  Judgement:  Fair  Insight:  Fair  Psychomotor Activity:  Normal  Concentration:  Concentration: Fair and Attention Span: Fair  Recall:  [employer] of Knowledge: Fair  Language: Fair  Akathisia:  No  Handed:  Right  AIMS (if indicated): UTA  Assets:  Communication Skills Desire for Improvement Housing Intimacy Social Support  ADL's:  Intact  Cognition: WNL  Sleep:  Poor   Screenings: GAD-7   Flowsheet Row Video Visit from 10/19/2020 in [facility] Video Visit from 09/21/2020 in [facility] Video Visit from 10/14/2019 in [facility]  Total GAD-7 Score 2 4 1     [place] Visit from 09/23/2019 in [facility]  Total Score (max 30 points ) 27    PHQ2-9   Flowsheet Row Video Visit from 09/21/2020 in [facility] Video Visit from 10/14/2019 in [facility] Office Visit from  08/14/2019 in [facility] Office Visit from 07/07/2019 in [facility] Office Visit from 03/13/2019 in [facility]  PHQ-2 Total Score 1 0 0 0 0  PHQ-9 Total Score -- -- 0 -- 0    Flowsheet Row Video Visit from 09/21/2020 in [facility] CATEGORY No Risk       Assessment and Plan: SANVI EHLER is a 55 year old Caucasian female, married, currently on SSD, lives in [place], has a history of GAD, insomnia, chronic pain, osteoarthritis, degenerative disc disease, lumbar radiculopathy, recent shoulder surgery, multiple surgeries in the past.  She is currently struggling with pain status post shoulder surgery on Monday.  Patient will benefit from the following plan.  Plan GAD- improving She will continue to benefit from psychotherapy sessions Anxiety currently due to pain.  She will benefit from pain management.  She will continue to follow-up with her providers. Continue Cymbalta 60 mg p.o. daily Hydroxyzine 25-50 mg p.o. daily as needed for severe anxiety.  Insomnia-unstable due to pain, status post surgery She will benefit from sufficient pain management. Trazodone 150-200 mg p.o. nightly as needed Melatonin 3 mg p.o. nightly  Follow-up in clinic in 2 months or sooner if needed.  This note was generated in part or whole with  voice recognition software. Voice recognition is usually quite accurate but there are transcription errors that can and very often do occur. I apologize for any typographical errors that were not detected and corrected.        Ursula Alert, MD 11/30/2020, 10:42 AM

## 2020-12-03 ENCOUNTER — Other Ambulatory Visit: Payer: Self-pay | Admitting: Internal Medicine

## 2020-12-03 DIAGNOSIS — K219 Gastro-esophageal reflux disease without esophagitis: Secondary | ICD-10-CM

## 2020-12-07 ENCOUNTER — Other Ambulatory Visit: Payer: Self-pay | Admitting: Physician Assistant

## 2020-12-07 ENCOUNTER — Other Ambulatory Visit: Payer: Self-pay

## 2020-12-07 ENCOUNTER — Ambulatory Visit
Admission: RE | Admit: 2020-12-07 | Discharge: 2020-12-07 | Disposition: A | Discharge status: Home / Self Care | Payer: 59 | Source: Ambulatory Visit | Attending: Physician Assistant | Admitting: Physician Assistant

## 2020-12-07 ENCOUNTER — Emergency Department
Admission: EM | Admit: 2020-12-07 | Discharge: 2020-12-07 | Disposition: A | Discharge status: Home / Self Care | Payer: 59 | Attending: Emergency Medicine | Admitting: Emergency Medicine

## 2020-12-07 DIAGNOSIS — I82412 Acute embolism and thrombosis of left femoral vein: Secondary | ICD-10-CM | POA: Diagnosis not present

## 2020-12-07 DIAGNOSIS — J45909 Unspecified asthma, uncomplicated: Secondary | ICD-10-CM | POA: Diagnosis not present

## 2020-12-07 DIAGNOSIS — Z7952 Long term (current) use of systemic steroids: Secondary | ICD-10-CM | POA: Diagnosis not present

## 2020-12-07 DIAGNOSIS — M79662 Pain in left lower leg: Secondary | ICD-10-CM

## 2020-12-07 DIAGNOSIS — J449 Chronic obstructive pulmonary disease, unspecified: Secondary | ICD-10-CM | POA: Insufficient documentation

## 2020-12-07 DIAGNOSIS — Z96643 Presence of artificial hip joint, bilateral: Secondary | ICD-10-CM | POA: Insufficient documentation

## 2020-12-07 DIAGNOSIS — Z7901 Long term (current) use of anticoagulants: Secondary | ICD-10-CM | POA: Diagnosis not present

## 2020-12-07 IMAGING — US US EXTREM LOW VENOUS*L*
1 series · 13 of 24 positions shown · non-contrast
Comparison: None.

CLINICAL DATA: Left lower extremity pain and edema for the past 10
days. Evaluate for DVT.



[Series 1: us venous img lower uni left (dvt) · portal-venous · 13 of 35 slices shown]
[im 1/35]
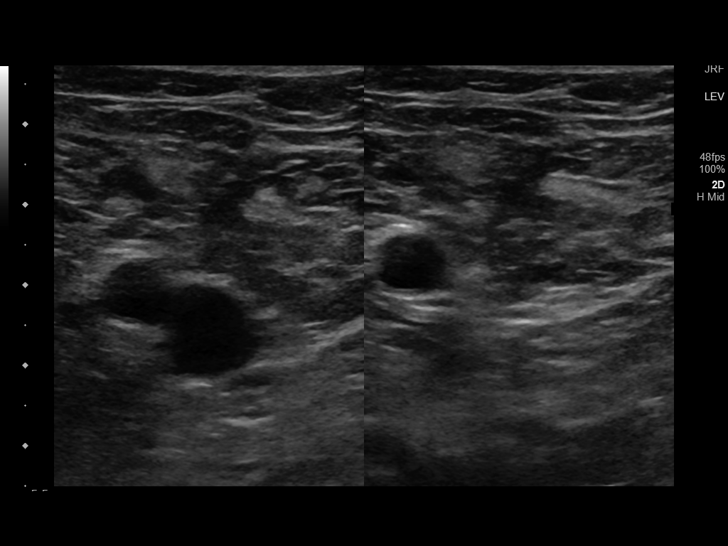
[im 3/35]
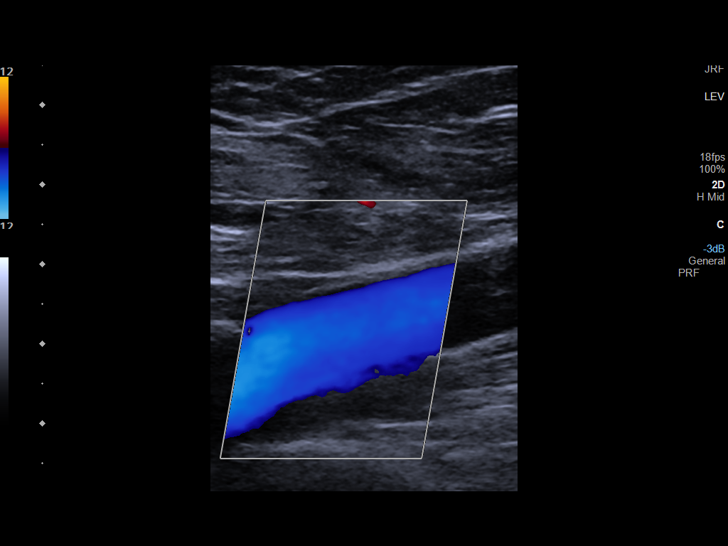
[im 6/35]
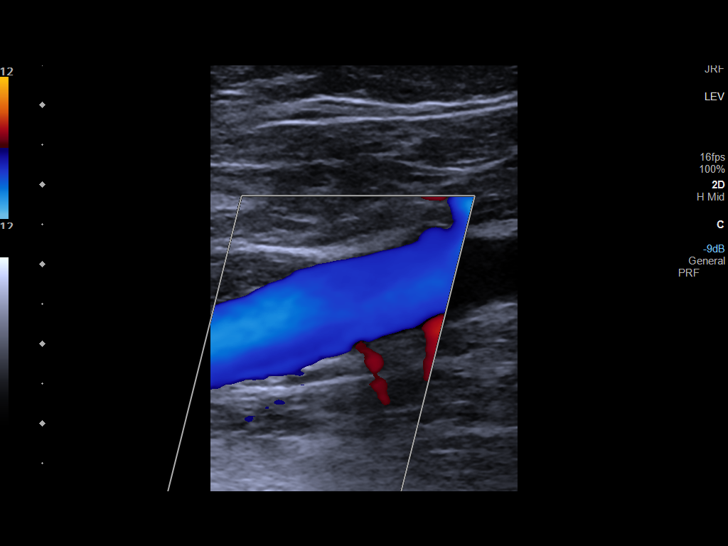
[im 9/35]
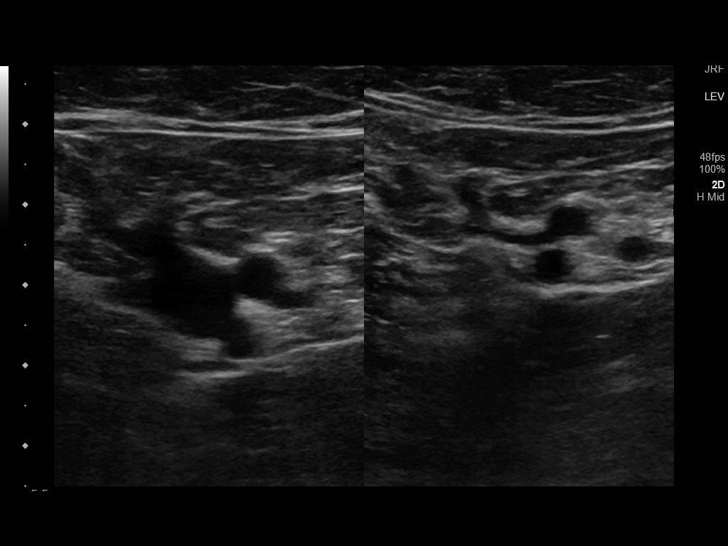
[im 12/35]
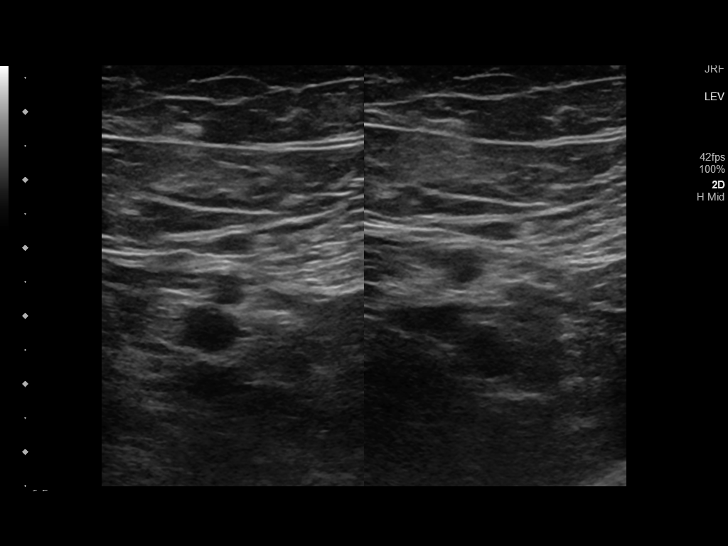
[im 15/35]
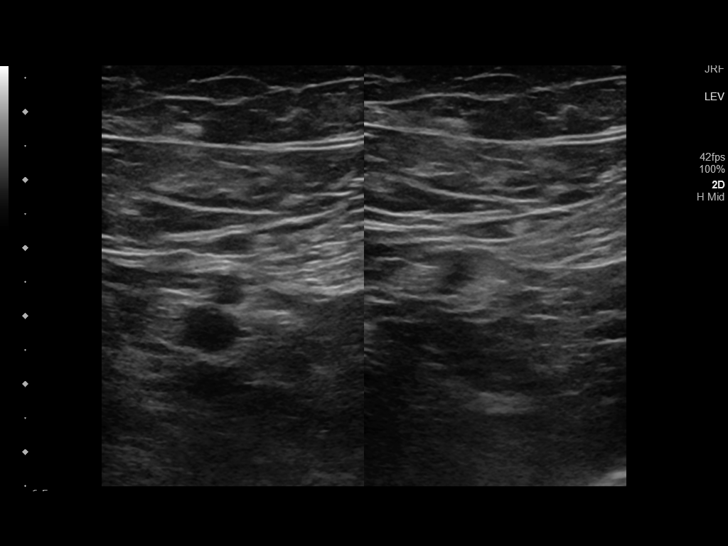
[im 18/35]
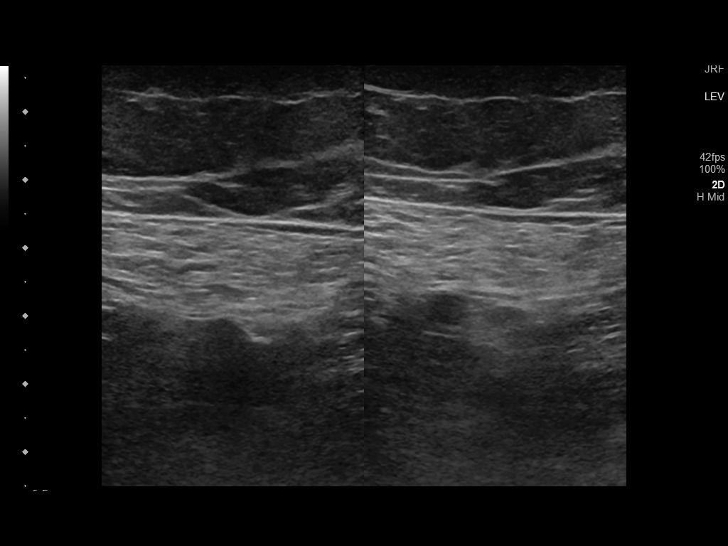
[im 20/35]
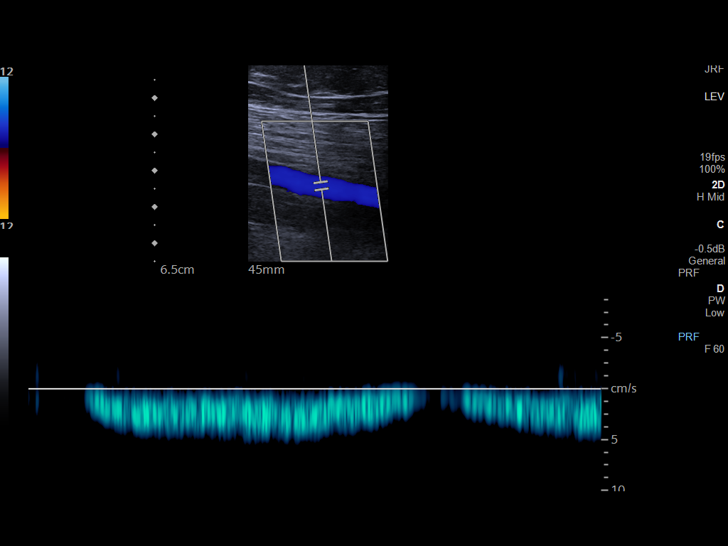
[im 23/35]
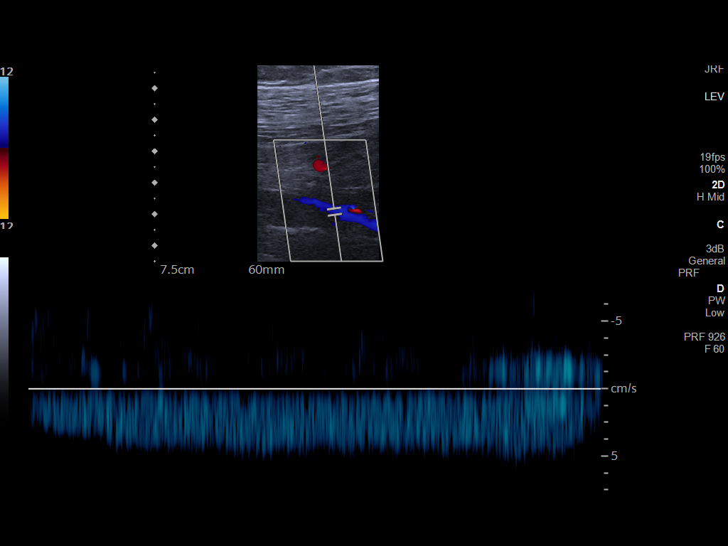
[im 26/35]
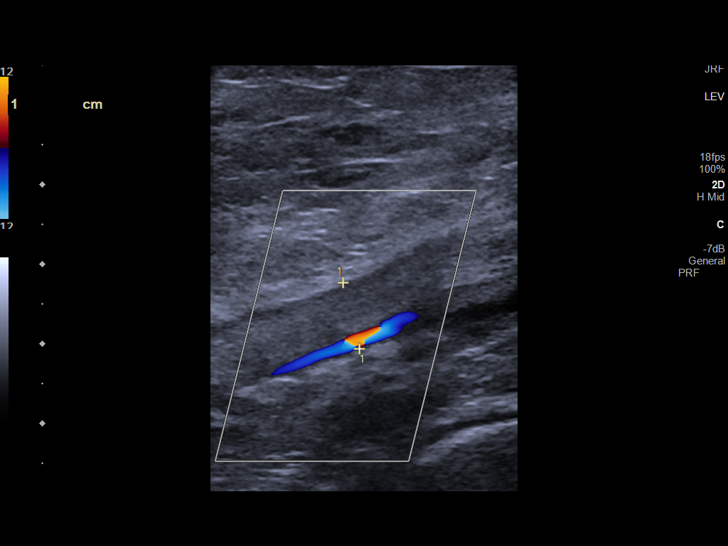
[im 29/35]
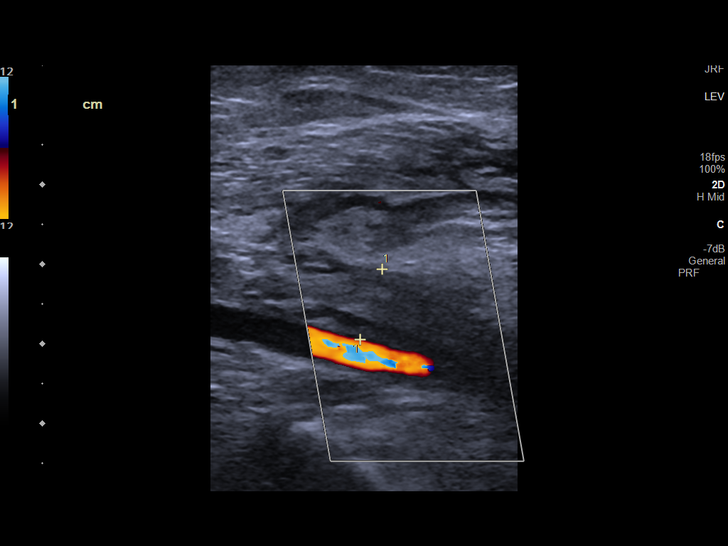
[im 32/35]
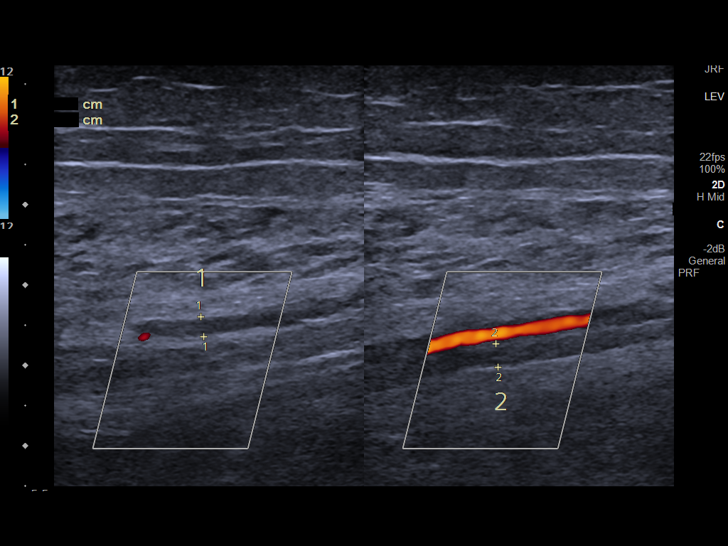
[im 35/35]
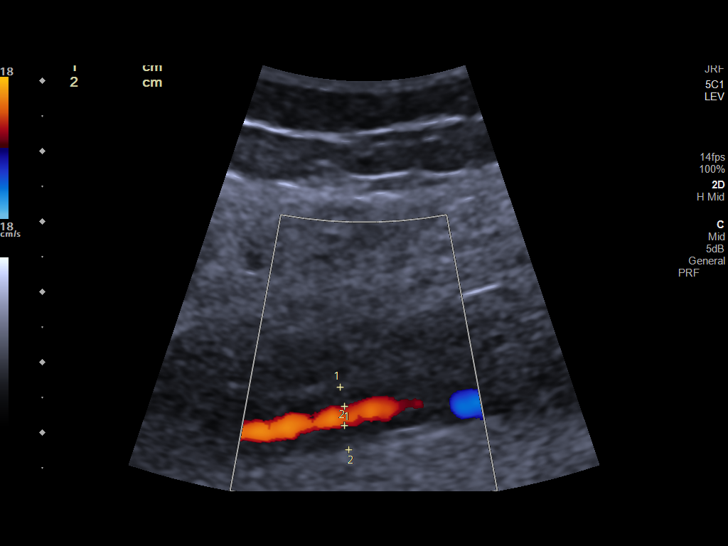

[13 of 24 positions shown; findings below may reference images not displayed]

FINDINGS: Contralateral Common Femoral Vein: Respiratory phasicity is normal
and symmetric with the symptomatic side. No evidence of thrombus.
Normal compressibility.

Common Femoral Vein: No evidence of thrombus. Normal
compressibility, respiratory phasicity and response to augmentation.

Saphenofemoral Junction: No evidence of thrombus. Normal
compressibility and flow on color Doppler imaging.

Profunda Femoral Vein: No evidence of thrombus. Normal
compressibility and flow on color Doppler imaging.

Femoral Vein: While the proximal (image 18) and mid (image 21)
aspects of the femoral vein are widely patent, there is hypoechoic
near occlusive thrombus involving the distal aspect the left femoral
vein (images 22 and 23).

Popliteal Vein: There is hypoechoic occlusive thrombus involving the
proximal (images 25 and 26 and distal (images 29 and 30) aspects of
the left popliteal vein.

Calf Veins: There is hypoechoic occlusive thrombus involving both
paired left posterior tibial veins (images 32 and 33) as well as
both paired left peroneal veins (images 34 and 35).

Superficial Great Saphenous Vein: No evidence of thrombus. Normal
compressibility.

Other Findings:  None.
IMPRESSION: Examination is positive for predominantly occlusive DVT extending
from the distal aspect of the left femoral vein through the
popliteal vein to involve the imaged tibial veins.

## 2020-12-07 MED ORDER — RIVAROXABAN (XARELTO) VTE STARTER PACK (15 & 20 MG): ORAL_TABLET | ORAL | 0 refills | Status: DC

## 2020-12-07 MED ORDER — OXYCODONE HCL 5 MG PO TABS
15.0000 mg | ORAL_TABLET | Freq: Once | ORAL | Status: AC
  Administered 2020-12-07: 15 mg via ORAL
  Filled 2020-12-07: qty 3

## 2020-12-07 MED ORDER — RIVAROXABAN 15 MG PO TABS
15.0000 mg | ORAL_TABLET | Freq: Once | ORAL | Status: AC
  Administered 2020-12-07: 15 mg via ORAL
  Filled 2020-12-07: qty 1

## 2020-12-07 MED ORDER — RIVAROXABAN 20 MG PO TABS: 20.0000 mg | ORAL_TABLET | Freq: Every day | ORAL | 0 refills | Status: DC

## 2020-12-07 MED ORDER — OXYCODONE HCL 5 MG PO TABS: 5.0000 mg | ORAL_TABLET | Freq: Once | ORAL | Status: DC

## 2020-12-07 MED ORDER — ACETAMINOPHEN 500 MG PO TABS
1000.0000 mg | ORAL_TABLET | Freq: Once | ORAL | Status: AC
  Administered 2020-12-07: 1000 mg via ORAL
  Filled 2020-12-07: qty 2

## 2020-12-07 NOTE — Discharge Instructions (Addendum)
As we discussed, you do have another blood clot in your left leg.   Reviewed discharge to prescription for Xarelto blood thinners to take for the next 6 months to treat this.  This was likely precipitated by her shoulder surgery who just recently had.  As directed on the packaging, for Xarelto, you take the 15 mg tablets twice daily for the next 3 weeks/the first 21 days. After these first 3 weeks, he will transition to the 20 mg tablets and take just once daily to finish a 49-month course.   Please follow-up with your primary care physician in the next 1-2 weeks to discuss this.  We will give you enough blood thinners for 6 months, you may only need to take them for 3 months total considering this may have been precipitated by a surgery.  If you develop any episodes of passing out, severe chest pains, difficulty breathing, please return to the ED.

## 2020-12-07 NOTE — ED Triage Notes (Signed)
Pt comes with c/o left leg pain. Pt states this started few days back. Pt states she has confirmed dx of DVT.

## 2020-12-07 NOTE — ED Provider Notes (Signed)
[facility] Emergency Department Provider Note ____________________________________________   Event Date/Time   First MD Initiated Contact with Patient 12/07/20 1647     (approximate)  I have reviewed the triage vital signs and the nursing notes.  HISTORY  Chief Complaint Leg Pain   HPI Maureen Randall is a 55 y.o. femalewho presents to the ED for evaluation of left leg pain.  Chart review indicates history of anxiety, postop DVT of the right leg remotely.  Chronic distal left leg pain s/p left deep peroneal nerve neurectomy with neurosurgery 05/2020  Patient reports an elective right shoulder surgery that occurred a couple weeks ago, and developing left leg pain and swelling over the past 5 days reminiscent of her previous DVT.  She denies any falls, trauma or injuries to this left leg.  She denies shortness of breath, syncopal episodes, hemoptysis, cough, falls or injury.  Does report "a twinge" of chest pain that occurred earlier today while she was laying flat.  Patient had an outpatient DVT ultrasound performed of her left leg earlier today, ordered by her PCP, which I reviewed and demonstrates acute DVT throughout the left leg extending from distal femoral vein through popliteal and tibial veins.     Past Medical History:  Diagnosis Date  . Anxiety   . Arthritis    osteoarthritis-joints, feet, shoulder  . Asthma    previously diagnosed after a bout of bronchitis/ no problems since  . Bursitis    b/l hips  . Chronic pain syndrome   . COPD (chronic obstructive pulmonary disease) ([place]) 2004   patient states probably bronchitis  . Depression   . DVT (deep venous thrombosis) ([place]) 2012   right leg. after surgery, while on BCP  . GERD (gastroesophageal reflux disease)   . Hyperlipidemia   . Neuropathy    post surgery  . PVD (peripheral vascular disease) ([place])   . Spondylolisthesis     Patient Active Problem List   Diagnosis Date Noted  .  Menopausal flushing 11/30/2020  . Anterior to posterior tear of superior glenoid labrum of right shoulder 11/03/2020  . Malpositioned IUD, initial encounter 08/04/2020  . Recurrent falls 07/26/2020  . Peroneal neuropathy, left 07/26/2020  . Adenoma of left adrenal gland 07/26/2020  . Hyperlipidemia 07/26/2020  . Hiatal hernia 06/29/2020  . Mass of both adrenal glands ([place]) 06/29/2020  . 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 Special screening for malignant neoplasms, colon   . Chronic bilateral low back pain 12/23/2019  . Varicose veins of both lower extremities 12/23/2019  . H/O nasal septoplasty 09/10/2019  . Biceps tendinitis 09/10/2019  . Preoperative clearance 09/10/2019  . 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 08/17/2019  . Muscle spasm 07/09/2019  . Primary osteoarthritis involving multiple joints 07/07/2019  . Disorder of left rotator cuff 04/02/2019  . Asthma 04/02/2019  . Prediabetes 04/01/2019  . Generalized anxiety disorder 02/18/2019  . Insomnia due to medical condition 02/18/2019  . Gastroesophageal reflux disease without esophagitis 12/25/2018  . Recurrent UTI 12/25/2018  . Obesity (BMI 30-39.9) 12/25/2018  . 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 Venous insufficiency of both lower extremities 10/20/2018  . IUD threads lost 04/30/2018  . 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 History of lumbar fusion 01/29/2018  . Primary osteoarthritis of one hip, right 12/20/2017  . History of total right hip replacement 12/20/2017  .  Closed fracture of metatarsal bone 08/06/2017  . Sprain of ankle 08/06/2017  . Strain of foot 01/17/2017  . Long term current use of opiate analgesic 08/31/2015  . Chronic pain syndrome 08/31/2015  . Bilateral carpal tunnel syndrome 08/31/2015  . Arthralgia of multiple joints 08/31/2015  . Increased band cell count  08/31/2015  . Bilateral lumbar radiculopathy 07/27/2014  . Degenerative spondylolisthesis 07/27/2014  . Facet arthropathy, lumbosacral 07/27/2014  . 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 Anxiety state 10/21/2009    Past Surgical History:  Procedure Laterality Date  . ABLATION     veins in lower legs  . BACK SURGERY  07/2014   lumbar spine/ fusion 1 level  . BREAST BIOPSY Left 2011   neg  . CARPAL TUNNEL RELEASE Right 12/2015   [facility]  . CARPAL TUNNEL RELEASE Left 12/2016  . COLONOSCOPY WITH PROPOFOL N/A 02/29/2020   Procedure: COLONOSCOPY WITH PROPOFOL;  Surgeon: [name], MD;  Location: [place];  Service: Endoscopy;  Laterality: N/A;  . ESOPHAGOGASTRODUODENOSCOPY (EGD) WITH PROPOFOL N/A 02/29/2020   Procedure: ESOPHAGOGASTRODUODENOSCOPY (EGD) WITH PROPOFOL;  Surgeon: [name], MD;  Location: [place];  Service: Endoscopy;  Laterality: N/A;  . FOOT SURGERY Right 2015   broken navicular bone  . HIP ARTHROSCOPY Right 05/2011  . HIP SURGERY     left hip surgery 05/2018  . JOINT REPLACEMENT Bilateral 12/2017 & 05/2018   hip  . L5-S1 OLIF Dr. Rennis Harding back surgery 09/2019  09/28/2019  . left ganglion cyst removal     12/23/19 Dr. Elvina Mattes  . MASS EXCISION Left 12/17/2019   Procedure: EXCISION TUMOR FOOT DEEP LEFT;  Surgeon: Albertine Patricia, DPM;  Location: [place];  Service: Podiatry;  Laterality: Left;  LMA W/LOCAL  . NASAL TURBINATE REDUCTION Bilateral 06/12/2019   Procedure: TURBINATE REDUCTION/SUBMUCOSAL RESECTION;  Surgeon: [name], MD;  Location: [place];  Service: ENT;  Laterality: Bilateral;  . 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 ROTATOR CUFF REPAIR     Dr. [name] in 04/2019   . SCLEROTHERAPY     veins in lower legs  . SEPTOPLASTY Bilateral 06/12/2019   Procedure: SEPTOPLASTY;  Surgeon: [name], MD;  Location: [place];   Service: ENT;  Laterality: Bilateral;  . SHOULDER SURGERY Right 12/2015   [facility] torn rotator cuff  . SPINAL FUSION  2016   [facility] lumbar    Prior to Admission medications   Medication Sig Start Date End Date Taking? Authorizing Provider  rivaroxaban (XARELTO) 20 MG TABS tablet Take 1 tablet (20 mg total) by mouth daily. 12/07/20 03/07/21 Yes Vladimir Crofts, MD  RIVAROXABAN Alveda Reasons) VTE STARTER PACK (15 & 20 MG) Follow package directions: Take one 15mg  tablet by mouth twice a day. On day 22, switch to one 20mg  tablet once a day. Take with food. 12/07/20  Yes Vladimir Crofts, MD  azithromycin (ZITHROMAX) 250 MG tablet 2 pills day 1 and 1 pill day 2-5 with food Patient not taking: Reported on 11/30/2020 10/04/20   McLean-Scocuzza, Nino Glow, MD  baclofen (LIORESAL) 10 MG tablet Take 10 mg by mouth 3 (three) times daily as needed for muscle spasms.    [provider]  dexlansoprazole (DEXILANT) 60 MG capsule Take 1 capsule (60 mg total) by mouth daily. 08/12/20   [name], MD  diclofenac Sodium (VOLTAREN) 1 % GEL Apply 2-4 g topically 4 (four) times daily.  2 gram upper body qid prn and 4 gram lower body qid prn 07/26/20   McLean-Scocuzza, Nino Glow, MD  DULoxetine (CYMBALTA) 60 MG capsule TAKE 1 CAPSULE BY MOUTH EVERY DAY 11/30/20   Ursula Alert, MD  Elastic Bandages & Supports (RELIEF KNEE) MISC Apply in the morning and remove at night. 07/15/13   [provider]  estradiol (ESTRACE) 0.1 MG/GM vaginal cream INSERT PEA SIZED AMOUNT (0.5G) INTO VAGINA NIGHTLY EVERY OTHER NIGHT FOR 2 WEEKS, THEN TWICE WEEKLY 02/05/17   [provider]  famotidine (PEPCID) 20 MG tablet TAKE 1 TABLET BY MOUTH EVERYDAY AT BEDTIME 12/06/20   McLean-Scocuzza, Nino Glow, MD  fluticasone (FLONASE) 50 MCG/ACT nasal spray Place 1-2 sprays into both nostrils daily as needed. Max dose 2 sprays each nostril 03/14/20   McLean-Scocuzza, Nino Glow, MD  hydrOXYzine (ATARAX/VISTARIL) 25 MG tablet Take 1-2 tablets  (25-50 mg total) by mouth daily as needed for anxiety. 10/19/20   Ursula Alert, MD  linaclotide Rolan Lipa) 145 MCG CAPS capsule Take 1 capsule (145 mcg total) by mouth daily before breakfast. 03/16/20   [name], MD  Melatonin 3 MG CAPS Take by mouth.    [provider]  meloxicam (MOBIC) 15 MG tablet meloxicam 15 mg tablet    [provider]  methocarbamol (ROBAXIN) 500 MG tablet Take 2 tablets (1,000 mg total) by mouth 2 (two) times daily as needed for muscle spasms. 07/07/19   McLean-Scocuzza, Nino Glow, MD  ondansetron (ZOFRAN-ODT) 8 MG disintegrating tablet TAKE 1 TABLET BY MOUTH EVERY 8 HOURS AS NEEDED FOR NAUSEA AND VOMITING**PA PENDING 11/16/20   McLean-Scocuzza, Nino Glow, MD  oxyCODONE-acetaminophen (PERCOCET) 10-325 MG tablet Take 1 tablet by mouth every 6 (six) hours as needed. 06/16/19   [provider]  phentermine (ADIPEX-P) 37.5 MG tablet Take 1 tablet (37.5 mg total) by mouth daily before breakfast. rx 1/2 07/26/20   McLean-Scocuzza, Nino Glow, MD  pregabalin (LYRICA) 50 MG capsule TAKE 1 TO 2 CAPSULES BY MOUTH EVERY 12 HOURS AS NEEDED 08/01/20   [provider]  traZODone (DESYREL) 100 MG tablet Take 2 tablets (200 mg total) by mouth at bedtime as needed for sleep. 11/30/20   Ursula Alert, MD  valACYclovir (VALTREX) 1000 MG tablet as needed. Oral blister 07/04/18   [provider]    Allergies Amoxicillin, Septra [sulfamethoxazole-trimethoprim], and Cephalexin  Family History  Problem Relation Age of Onset  . Breast cancer Maternal Aunt   . Breast cancer Paternal Aunt   . Dementia Paternal Aunt   . COPD Mother        quit smoking after 60 years  . Arthritis Mother   . Osteoarthritis Mother   . Dementia Father        symptoms started in his 52s  . Alzheimer's disease Father   . Rheum arthritis Father   . Rheum arthritis Other        FH  . Dementia Other        "all my dad's side of the family"  . Other Maternal Grandmother         got dementia as she got older   . Dementia Paternal Grandmother     Social History Social History   Tobacco Use  . Smoking status: Never Smoker  . Smokeless tobacco: Never Used  Vaping Use  . Vaping Use: Never used  Substance Use Topics  . Alcohol use: Yes    Alcohol/week: 1.0 standard drink    Types: 1 Cans of beer  per week    Comment: may have drink 1x/month  . Drug use: Never    Review of Systems  Constitutional: No fever/chills Eyes: No visual changes. ENT: No sore throat. Cardiovascular: Positive for chest pain. Respiratory: Denies shortness of breath. Gastrointestinal: No abdominal pain.  No nausea, no vomiting.  No diarrhea.  No constipation. Genitourinary: Negative for dysuria. Musculoskeletal: Negative for back pain.  Positive for atraumatic left leg pain and swelling Skin: Negative for rash. Neurological: Negative for headaches, focal weakness or numbness.  ____________________________________________   PHYSICAL EXAM:  VITAL SIGNS: Vitals:   12/07/20 1603  BP: (!) 136/100  Pulse: 71  Resp: 18  Temp: 98 F (36.7 C)  SpO2: 100%     Constitutional: Alert and oriented. Well appearing and in no acute distress. Eyes: Conjunctivae are normal. PERRL. EOMI. Head: Atraumatic. Nose: No congestion/rhinnorhea. Mouth/Throat: Mucous membranes are moist.  Oropharynx non-erythematous. Neck: No stridor. No cervical spine tenderness to palpation. Cardiovascular: Normal rate, regular rhythm. Grossly normal heart sounds.  Good peripheral circulation. Respiratory: Normal respiratory effort.  No retractions. Lungs CTAB. Gastrointestinal: Soft , nondistended, nontender to palpation. No CVA tenderness. Musculoskeletal:  No joint effusions. No signs of acute trauma. Left ankle and calf is diffusely swollen and edematous compared to the right.  No overlying signs of trauma.  Mild tenderness throughout the left calf.  Left foot is distally neurovascularly  intact. Neurologic:  Normal speech and language. No gross focal neurologic deficits are appreciated.  Cranial nerves II through XII intact 5/5 strength and sensation in all 4 extremities Skin:  Skin is warm, dry and intact. No rash noted. Psychiatric: Mood and affect are normal. Speech and behavior are normal.  ____________________________________________   LABS (all labs ordered are listed, but only abnormal results are displayed)  Labs Reviewed - No data to display ____________________________________________  12 Lead EKG Sinus rhythm, rate of 65 bpm.  Normal axis and intervals.  No evidence of acute ischemia.  ____________________________________________  RADIOLOGY  ED MD interpretation:    Official radiology report(s): US Venous Img Lower Unilateral Left (DVT)  Result Date: 12/07/2020 CLINICAL DATA:  Left lower extremity pain and edema for the past 10 days. Evaluate for DVT. EXAM: LEFT LOWER EXTREMITY VENOUS DOPPLER ULTRASOUND TECHNIQUE: Gray-scale sonography with graded compression, as well as color Doppler and duplex ultrasound were performed to evaluate the lower extremity deep venous systems from the level of the common femoral vein and including the common femoral, femoral, profunda femoral, popliteal and calf veins including the posterior tibial, peroneal and gastrocnemius veins when visible. The superficial great saphenous vein was also interrogated. Spectral Doppler was utilized to evaluate flow at rest and with distal augmentation maneuvers in the common femoral, femoral and popliteal veins. COMPARISON:  None. FINDINGS: Contralateral Common Femoral Vein: Respiratory phasicity is normal and symmetric with the symptomatic side. No evidence of thrombus. Normal compressibility. Common Femoral Vein: No evidence of thrombus. Normal compressibility, respiratory phasicity and response to augmentation. Saphenofemoral Junction: No evidence of thrombus. Normal compressibility and flow on  color Doppler imaging. Profunda Femoral Vein: No evidence of thrombus. Normal compressibility and flow on color Doppler imaging. Femoral Vein: While the proximal (image 18) and mid (image 21) aspects of the femoral vein are widely patent, there is hypoechoic near occlusive thrombus involving the distal aspect the left femoral vein (images 22 and 23). Popliteal Vein: There is hypoechoic occlusive thrombus involving the proximal (images 25 and 26 and distal (images 29 and 30) aspects of the left popliteal  vein. Calf Veins: There is hypoechoic occlusive thrombus involving both paired left posterior tibial veins (images 32 and 33) as well as both paired left peroneal veins (images 34 and 35). Superficial Great Saphenous Vein: No evidence of thrombus. Normal compressibility. Other Findings:  None. IMPRESSION: Examination is positive for predominantly occlusive DVT extending from the distal aspect of the left femoral vein through the popliteal vein to involve the imaged tibial veins. Electronically Signed   By: Sandi Mariscal M.D.   On: 12/07/2020 15:11    ____________________________________________   PROCEDURES and INTERVENTIONS  Procedure(s) performed (including Critical Care):  Procedures  Medications  Rivaroxaban (XARELTO) tablet 15 mg (15 mg Oral Given 12/07/20 1746)  acetaminophen (TYLENOL) tablet 1,000 mg (1,000 mg Oral Given 12/07/20 1747)  oxyCODONE (Oxy IR/ROXICODONE) immediate release tablet 15 mg (15 mg Oral Given 12/07/20 1747)    ____________________________________________   MDM / ED COURSE   55 year old woman with history of the same presents to the ED with acute DVT of the left leg amenable to outpatient management.  Normal vitals on room air.  Exam is reassuring without distress, shortness of breath or phlegmasia cerulea dolens.  She recalls being treated with Xarelto 10 years ago, and I see no reason not to treat with the same this time.  This DVT was likely precipitated by her recent  shoulder surgery and associated lesser mobilization.  EKG is nonischemic and reassuring.  We initiate Xarelto here in the ED and I provided prescription for the same at home.  We discussed following up with her PCP to discuss duration of this regimen.  We discussed he may only need to be for 3 months due to this possibly be precipitated by her surgery, but possibly up to 6 months.  We discussed return precautions for the ED and patient stable for outpatient management.   Clinical Course as of 12/07/20 1809  Wed Dec 07, 2020  1805 I discussed with the patient her clinical stability and no need for CTA chest to evaluate for acute PE because her treatment is the same.  We discussed that she looks well, conversational without respiratory symptoms.  Not tachycardic, not hypoxic on treatment with likelihood to have significant PE that would require instrumentation.  We discussed initiating another course of Xarelto, anticoagulation that she had 10 years ago.  She is agreeable without proceeding with CTA chest. [DS]    Clinical Course User Index [DS] Vladimir Crofts, MD    ____________________________________________   FINAL CLINICAL IMPRESSION(S) / ED DIAGNOSES  Final diagnoses:  Acute deep vein thrombosis (DVT) of femoral vein of left lower extremity [facility])     ED Discharge Orders         Ordered    RIVAROXABAN (XARELTO) VTE STARTER PACK (15 & 20 MG)        12/07/20 1735    rivaroxaban (XARELTO) 20 MG TABS tablet  Daily        12/07/20 1735           Eadie Repetto Tamala Julian   Note:  This document was prepared using Dragon voice recognition software and may include unintentional dictation errors.   Vladimir Crofts, MD 12/07/20 [phone]

## 2020-12-09 ENCOUNTER — Other Ambulatory Visit: Payer: Self-pay | Admitting: Internal Medicine

## 2020-12-09 DIAGNOSIS — R110 Nausea: Secondary | ICD-10-CM

## 2020-12-09 MED ORDER — ONDANSETRON HCL 8 MG PO TABS: 8.0000 mg | ORAL_TABLET | Freq: Three times a day (TID) | ORAL | 2 refills | Status: DC | PRN

## 2020-12-12 ENCOUNTER — Encounter: Payer: Self-pay | Admitting: Internal Medicine

## 2020-12-14 ENCOUNTER — Telehealth (INDEPENDENT_AMBULATORY_CARE_PROVIDER_SITE_OTHER): Payer: 59 | Admitting: Internal Medicine

## 2020-12-14 ENCOUNTER — Encounter: Payer: Self-pay | Admitting: Internal Medicine

## 2020-12-14 ENCOUNTER — Other Ambulatory Visit: Payer: Self-pay

## 2020-12-14 VITALS — Ht 67.0 in | Wt 199.0 lb

## 2020-12-14 DIAGNOSIS — I82412 Acute embolism and thrombosis of left femoral vein: Secondary | ICD-10-CM

## 2020-12-14 DIAGNOSIS — J0110 Acute frontal sinusitis, unspecified: Secondary | ICD-10-CM | POA: Diagnosis not present

## 2020-12-14 DIAGNOSIS — R0789 Other chest pain: Secondary | ICD-10-CM

## 2020-12-14 DIAGNOSIS — I82409 Acute embolism and thrombosis of unspecified deep veins of unspecified lower extremity: Secondary | ICD-10-CM | POA: Diagnosis not present

## 2020-12-14 NOTE — Progress Notes (Signed)
Telephone Note  I connected with Maureen Randall on 12/14/20 at  411 PM EDT by telephone and verified that I am speaking with the correct person using two identifiers.  Location patient: home, [place] Location provider:work or home office Persons participating in the virtual visit: patient, provider  I discussed the limitations of evaluation and management by telemedicine and the availability of in person appointments. The patient expressed understanding and agreed to proceed.   HPI:  Acute telemedicine visit for : 1.  Nasal congestion going to chest since since 11/30/20 started doxycycline. Tessalon 12/09/20 after urgent care visit 12/07/20 having cough, sob, chest tightness, dizziness  resolved sx's better has nasal saline, zyrtec  She is feeling better  2. 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 [name]  Now on xarelto 15 mg bid x 3 weeks then will take 20 mg qd   -COVID-19 vaccine status: 4/4 covid pfizer  ROS: See pertinent positives and negatives per HPI.  Past Medical History:  Diagnosis Date  . Anxiety   . Arthritis    osteoarthritis-joints, feet, shoulder  . Asthma    previously diagnosed after a bout of bronchitis/ no problems since  . Bursitis    b/l hips  . Chronic pain syndrome   . COPD (chronic obstructive pulmonary disease) ([place]) 2004   patient states probably bronchitis  . Depression   . DVT (deep venous thrombosis) ([place]) 2012   right leg. after surgery, while on BCP  . GERD (gastroesophageal reflux disease)   . Hyperlipidemia   . Left leg DVT ([place])    fem/pop 12/07/20 after right shoulder surgery 11/30/20  . Neuropathy    post surgery  . PVD (peripheral vascular disease) ([place])   . Spondylolisthesis     Past Surgical History:  Procedure Laterality Date  . ABLATION     veins in lower legs  . BACK SURGERY  07/2014   lumbar spine/ fusion 1 level  . BREAST BIOPSY Left 2011   neg  . CARPAL TUNNEL RELEASE  Right 12/2015   [facility]  . CARPAL TUNNEL RELEASE Left 12/2016  . COLONOSCOPY WITH PROPOFOL N/A 02/29/2020   Procedure: COLONOSCOPY WITH PROPOFOL;  Surgeon: [name], MD;  Location: [facility];  Service: Endoscopy;  Laterality: N/A;  . ESOPHAGOGASTRODUODENOSCOPY (EGD) WITH PROPOFOL N/A 02/29/2020   Procedure: ESOPHAGOGASTRODUODENOSCOPY (EGD) WITH PROPOFOL;  Surgeon: [name], MD;  Location: [facility];  Service: Endoscopy;  Laterality: N/A;  . FOOT SURGERY Right 2015   broken navicular bone  . HIP ARTHROSCOPY Right 05/2011  . HIP SURGERY     left hip surgery 05/2018  . JOINT REPLACEMENT Bilateral 12/2017 & 05/2018   hip  . L5-S1 OLIF Dr. Rennis Harding back surgery 09/2019  09/28/2019  . left ganglion cyst removal     12/23/19 Dr. Elvina Mattes  . MASS EXCISION Left 12/17/2019   Procedure: EXCISION TUMOR FOOT DEEP LEFT;  Surgeon: Albertine Patricia, DPM;  Location: [facility];  Service: Podiatry;  Laterality: Left;  LMA W/LOCAL  . NASAL TURBINATE REDUCTION Bilateral 06/12/2019   Procedure: TURBINATE REDUCTION/SUBMUCOSAL RESECTION;  Surgeon: [name], MD;  Location: [facility];  Service: ENT;  Laterality: Bilateral;  . REPLACEMENT TOTAL HIP W/  RESURFACING IMPLANTS Right 12/2017   [facility]  . right shoulder surgery     11/28/20 for bone spur, torn labrum, re attached biceps tendon, tear  . ROTATOR CUFF REPAIR  Dr. [name] in 04/2019   . SCLEROTHERAPY     veins in lower legs  . SEPTOPLASTY Bilateral 06/12/2019   Procedure: SEPTOPLASTY;  Surgeon: [name], MD;  Location: [facility];  Service: ENT;  Laterality: Bilateral;  . SHOULDER SURGERY Right 12/2015   [facility] torn rotator cuff  . SPINAL FUSION  2016   [facility] lumbar     Current Outpatient Medications:  .  baclofen (LIORESAL) 10 MG tablet, Take 10 mg by mouth 3 (three) times daily as needed for muscle spasms., Disp: , Rfl:  .  cetirizine (ZYRTEC) 10 MG  tablet, Take 10 mg by mouth daily., Disp: , Rfl:  .  dexlansoprazole (DEXILANT) 60 MG capsule, Take 1 capsule (60 mg total) by mouth daily., Disp: 30 capsule, Rfl: 6 .  diclofenac Sodium (VOLTAREN) 1 % GEL, Apply 2-4 g topically 4 (four) times daily. 2 gram upper body qid prn and 4 gram lower body qid prn, Disp: 100 g, Rfl: 11 .  doxycycline (VIBRAMYCIN) 100 MG capsule, Take 100 mg by mouth 2 (two) times daily., Disp: , Rfl:  .  DULoxetine (CYMBALTA) 60 MG capsule, TAKE 1 CAPSULE BY MOUTH EVERY DAY, Disp: 90 capsule, Rfl: 1 .  estradiol (ESTRACE) 0.1 MG/GM vaginal cream, every 14 (fourteen) days., Disp: , Rfl:  .  famotidine (PEPCID) 20 MG tablet, TAKE 1 TABLET BY MOUTH EVERYDAY AT BEDTIME, Disp: 90 tablet, Rfl: 0 .  fluconazole (DIFLUCAN) 150 MG tablet, Take by mouth., Disp: , Rfl:  .  fluticasone (FLONASE) 50 MCG/ACT nasal spray, Place 1-2 sprays into both nostrils daily as needed. Max dose 2 sprays each nostril, Disp: 16 g, Rfl: 12 .  hydrOXYzine (ATARAX/VISTARIL) 25 MG tablet, Take 1-2 tablets (25-50 mg total) by mouth daily as needed for anxiety., Disp: 60 tablet, Rfl: 1 .  Melatonin 3 MG CAPS, Take by mouth., Disp: , Rfl:  .  methocarbamol (ROBAXIN) 500 MG tablet, Take 2 tablets (1,000 mg total) by mouth 2 (two) times daily as needed for muscle spasms., Disp: 60 tablet, Rfl: 2 .  ondansetron (ZOFRAN) 8 MG tablet, Take 1 tablet (8 mg total) by mouth every 8 (eight) hours as needed for nausea or vomiting., Disp: 40 tablet, Rfl: 2 .  oxyCODONE-acetaminophen (PERCOCET) 10-325 MG tablet, Take 1 tablet by mouth every 6 (six) hours as needed., Disp: , Rfl:  .  pregabalin (LYRICA) 50 MG capsule, TAKE 1 TO 2 CAPSULES BY MOUTH EVERY 12 HOURS AS NEEDED, Disp: , Rfl:  .  RIVAROXABAN (XARELTO) VTE STARTER PACK (15 & 20 MG), Follow package directions: Take one 15mg  tablet by mouth twice a day. On day 22, switch to one 20mg  tablet once a day. Take with food., Disp: 51 each, Rfl: 0 .  traZODone (DESYREL) 100  MG tablet, Take 2 tablets (200 mg total) by mouth at bedtime as needed for sleep., Disp: 180 tablet, Rfl: 1 .  valACYclovir (VALTREX) 1000 MG tablet, as needed. Oral blister, Disp: , Rfl:  .  Elastic Bandages & Supports (RELIEF KNEE) MISC, Apply in the morning and remove at night. (Patient not taking: Reported on 12/14/2020), Disp: , Rfl:  .  linaclotide (LINZESS) 145 MCG CAPS capsule, Take 1 capsule (145 mcg total) by mouth daily before breakfast. (Patient not taking: Reported on 12/14/2020), Disp: 30 capsule, Rfl: 0 .  phentermine (ADIPEX-P) 37.5 MG tablet, Take 1 tablet (37.5 mg total) by mouth daily before breakfast. rx 1/2 (Patient not taking: Reported on 12/14/2020), Disp: 60 tablet,  Rfl: 0 .  rivaroxaban (XARELTO) 20 MG TABS tablet, Take 1 tablet (20 mg total) by mouth daily. (Patient not taking: Reported on 12/14/2020), Disp: 90 tablet, Rfl: 0  EXAM:  VITALS per patient if applicable:  GENERAL: alert, oriented, appears well and in no acute distress   PSYCH/NEURO: pleasant and cooperative, no obvious depression or anxiety, speech and thought processing grossly intact  ASSESSMENT AND PLAN:  Discussed the following assessment and plan:  Acute deep vein thrombosis (DVT) of femoral vein of left lower extremity ([place]) with hx right leg dvt in 2012 after surgery - Plan: Ambulatory referral to Hematology / Oncology Dr. Kerin Salen   For hypercoagulable w/u  xarelto 15 mg bid x 3 weeks then 20 mg qd   Acute frontal sinusitis,completed Zpack 09/29/20 and now on doxycycline 100 mg bid since 12/09/20  Feeling better On ns, zyrtec  Chest tightness ? Related resp illness vs ? PE but no CT chest was done on anticoagulation and sx's better   HM Declines flu shot  Tdaputd  Pfizer3/3 shingrix 1/2 given today 2nd dose at f/u4/20/22 or health department Consider hep B vaccine in future NR 08/31/15 -disc new Hep B vaccine in the futurex 2 doses  HCV neg 08/31/15  Pap neg 09/11/16 neg neg HPV Will  f/u Dr. Leafy Ro has IUDchecked 12/21/21and pap neg neg HPV  Mammogram 08/12/19 ob/gyn ordered Dr. Esaw Grandchild 2/8/22and negative  dexa6/28/21 normal  Colonoscopyutd had 02/2020 f/u in 10 years with EGD Dr. Allen Norris  Dermatology was due 09/2018 f/u usu. Year[facility] Per pt seen in 2021 and negative no bxs or ln2  Never smoker   Other providers Former PCP [facility] Dr. Garen Grams then [place] [facility] requested all imaging Xrays, MRIS, CT Pain Dr. Alvira Monday in [place] specialist Dr. Levada Schilling spine in [place] ENT-Dr. Tami Ribas stated pt did not have allergies of note had testing Psychiatry and therapy-following  PT [facility] Dr. Tyler Pita, Dr. Karie Schwalbe [facility]  Ortho spine Dr. Rennis Harding  Ortho Dr .[name] [facility] in [place]  Of note meds pt gets from PCP  Valtrex  adipex  Macrobid  Robaxin  voltaren gel  04/14/19 ENT Dr. Tami Ribas sinusitis deviated nasal septum right nasal endoscopy and left nasacort 55 nasal hoarse voice small hemorrhage right cord 2/2 coughing and inhalers f/u in 6 weeks after shoulder surgery     -we discussed possible serious and likely etiologies, options for evaluation and workup, limitations of telemedicine visit vs in person visit, treatment, treatment risks and precautions.   I discussed the assessment and treatment plan with the patient. The patient was provided an opportunity to ask questions and all were answered. The patient agreed with the plan and demonstrated an understanding of the instructions.    Time spent 20 minutes  Delorise Jackson, MD

## 2020-12-14 NOTE — Progress Notes (Signed)
Patient went to the ED 12/07/20. Had shoulder surgery in May. Had history of DVT and was having leg pain. Patient was diagnosed with DVT with ultrasound. From the groin down the leg.  Has followed up with surgery and vascular.  Surgeon suggested follow up with PCP to check blood work and work up for cause of blood clot.

## 2020-12-21 ENCOUNTER — Inpatient Hospital Stay: Payer: 59 | Admitting: Oncology

## 2020-12-28 ENCOUNTER — Inpatient Hospital Stay: Payer: 59

## 2020-12-28 ENCOUNTER — Encounter: Payer: Self-pay | Admitting: Oncology

## 2020-12-28 ENCOUNTER — Inpatient Hospital Stay: Payer: 59 | Attending: Oncology | Admitting: Oncology

## 2020-12-28 ENCOUNTER — Telehealth: Payer: Self-pay | Admitting: Gastroenterology

## 2020-12-28 VITALS — BP 107/75 | HR 73 | Temp 97.2°F | Resp 16 | Wt 205.5 lb

## 2020-12-28 DIAGNOSIS — F32A Depression, unspecified: Secondary | ICD-10-CM | POA: Diagnosis not present

## 2020-12-28 DIAGNOSIS — D75839 Thrombocytosis, unspecified: Secondary | ICD-10-CM | POA: Diagnosis not present

## 2020-12-28 DIAGNOSIS — Z79899 Other long term (current) drug therapy: Secondary | ICD-10-CM | POA: Diagnosis not present

## 2020-12-28 DIAGNOSIS — Z7901 Long term (current) use of anticoagulants: Secondary | ICD-10-CM | POA: Diagnosis not present

## 2020-12-28 DIAGNOSIS — I739 Peripheral vascular disease, unspecified: Secondary | ICD-10-CM | POA: Insufficient documentation

## 2020-12-28 DIAGNOSIS — E785 Hyperlipidemia, unspecified: Secondary | ICD-10-CM | POA: Diagnosis not present

## 2020-12-28 DIAGNOSIS — Z86718 Personal history of other venous thrombosis and embolism: Secondary | ICD-10-CM | POA: Diagnosis not present

## 2020-12-28 DIAGNOSIS — K219 Gastro-esophageal reflux disease without esophagitis: Secondary | ICD-10-CM | POA: Diagnosis not present

## 2020-12-28 DIAGNOSIS — F419 Anxiety disorder, unspecified: Secondary | ICD-10-CM | POA: Insufficient documentation

## 2020-12-28 DIAGNOSIS — I82412 Acute embolism and thrombosis of left femoral vein: Secondary | ICD-10-CM | POA: Insufficient documentation

## 2020-12-28 DIAGNOSIS — D72821 Monocytosis (symptomatic): Secondary | ICD-10-CM

## 2020-12-28 DIAGNOSIS — J449 Chronic obstructive pulmonary disease, unspecified: Secondary | ICD-10-CM | POA: Diagnosis not present

## 2020-12-28 DIAGNOSIS — M1990 Unspecified osteoarthritis, unspecified site: Secondary | ICD-10-CM | POA: Diagnosis not present

## 2020-12-28 LAB — BASIC METABOLIC PANEL
Anion gap: 7 (ref 5–15)
BUN: 18 mg/dL (ref 6–20)
CO2: 27 mmol/L (ref 22–32)
Calcium: 8.9 mg/dL (ref 8.9–10.3)
Chloride: 105 mmol/L (ref 98–111)
Creatinine, Ser: 0.65 mg/dL (ref 0.44–1.00)
GFR, Estimated: >60 mL/min (ref >60)
Glucose, Bld: 98 mg/dL (ref 70–99)
Potassium: 3.9 mmol/L (ref 3.5–5.1)
Sodium: 139 mmol/L (ref 135–145)

## 2020-12-28 NOTE — Progress Notes (Signed)
Hematology/Oncology Consult note [facility] [phone] [phone]   Patient Care Team: McLean-Scocuzza, Nino Glow, MD as PCP - General (Internal Medicine)  REFERRING PROVIDER: McLean-Scocuzza, Olivia Mackie *  CHIEF COMPLAINTS/REASON FOR VISIT:  Reestablish care for lower extremity DVT  PERTINENT HEMATOLOGY HISTORY  Patient was previously seen for thrombocytosis.  Reviewed patient's previous labs. Thrombocytosis onset is acute onset.  Patient has labs done at outside facility [facility].  Labs are reviewed via care everywhere. 05/21/2018, hemoglobin 11, hematocrit 32.9, platelet count 304, leukocytosis with a white count of 18.8, absolute neutrophils 16.2, absolute monocyte 1.5 05/15/2018, hemoglobin 13.5, platelet 412, white count 9.  Normal differential. 01/02/2018, hemoglobin 12, platelet 600, neutrophil was 17.2, WBC 10.4, absolute monocyte 1.4.  She also had history of arthritis.  Recently had CT lumbar spine without contrast done at [facility] on 01/13/2019 Sequelae of bilateral laminectomies with posterior instrumented fusion at L4-L5. The left-sided L4 pedicle screw tip is located outside of the vertebral body. No other evidence for hardware complication or failure. Osseous fusion of the right L4 and L5 articular processes. 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   07/30/2018 screening bilateral mammogram shows no mammographic evidence of malignancy.  No aggravating or elevated factors. Associated symptoms or signs:  Denies weight loss, fever, chills, fatigue, night sweats.  Context:  Smoking history: Denies any smoking history. Family history of polycythemia.  Denies History of iron deficiency anemia; denies History of DVT; remote provoked DVT of right lower extremity after right total hip replacement.  On aspirin 81 mg and compression therapy. Reports that she takes "injections "for about a few weeks. 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 Patient follows up with vascular surgery Dr. Bayard Males for vein insufficiency.  INTERVAL HISTORY Maureen Randall is a 55 y.o. female who has above history reviewed by me today presents for reestablish care for left lower extremity DVT. Problems and complaints are listed below: Patient has a remote history of provoked right lower extremity DVT secondary to right lower extremity procedure in 2022.  At that time she was on oral contraceptives.  Patient was treated with Xarelto. She also has a history of chronic left low extremity pain status post left deep peroneal neurectomy/neurolysis with neurosurgery Dr. Tyler Pita in November 2021.  11/09/2020, patient received a left lower extremity injection with 40 mg Kenalog and 4% lidocaine. She recently had elective right shoulder rotator cuff repair  procedure.  A few days after the procedure, patient developed left lower leg calf tenderness and swelling with no known trauma or injury.  [facility] and had ultrasound done for evaluation.  12/07/2020, venous ultrasound of the left lower extremity showed occlusive DVT extending from the distal aspect of the left femoral vein through the popliteal vein to involve the imaged tibial veins. Patient went to emergency room and was started on Xarelto starting daily.  She is about to finish 15 mg twice daily and going on 20 mg daily.  CT chest was not obtained due to [place] of respiratory symptoms, tachycardia and hypoxia. Patient reports tolerating the anticoagulation very well with no bleeding events.  Left lower extremity pain and tenderness have improved.  However she continues to feel left lower extremity pressure with prolonged standing.  She frequently elevate her legs. 12/14/2020 patient was seen via telemedicine by primary care provider for nasal congestion  going to  chest since 11/30/2020 and was treated with a course of doxycycline, Tessalon after urgent care visit on 12/07/2020.  She was not tested for COVID-19.  She has had COVID-19 vaccinations.  Patient was diagnosed with frontal sinusitis and treated with antibiotics.  She also has some chest tightness which has gotten better. Patient was referred to establish care with me for evaluation of lower extremity DVT.   Review of Systems  Constitutional:  Negative for appetite change, chills, fatigue and fever.  HENT:   Negative for hearing loss and voice change.   Eyes:  Negative for eye problems.  Respiratory:  Negative for chest tightness and cough.   Cardiovascular:  Negative for chest pain.  Gastrointestinal:  Negative for abdominal distention, abdominal pain and blood in stool.  Endocrine: Negative for hot flashes.  Genitourinary:  Negative for difficulty urinating and frequency.   Musculoskeletal:  Negative for arthralgias.       Left low extremity pressure  Skin:  Negative for itching and rash.  Neurological:  Negative for extremity weakness.  Hematological:  Negative for adenopathy.  Psychiatric/Behavioral:  Negative for confusion.    MEDICAL HISTORY:  Past Medical History:  Diagnosis Date   Anxiety    Arthritis    osteoarthritis-joints, feet, shoulder   Asthma    previously diagnosed after a bout of bronchitis/ no problems since   Bursitis    b/l hips   Chronic pain syndrome    COPD (chronic obstructive pulmonary disease) ([place]) 2004   patient states probably bronchitis   Depression    DVT (deep venous thrombosis) ([place]) 2012   right leg. after surgery, while on BCP   GERD (gastroesophageal reflux disease)    Hyperlipidemia    Left leg DVT ([place])    fem/pop 12/07/20 after right shoulder surgery 11/30/20   Neuropathy    post surgery   PVD (peripheral vascular disease) ([place])    Spondylolisthesis     SURGICAL HISTORY: Past Surgical History:  Procedure Laterality Date   ABLATION      veins in lower legs   BACK SURGERY  07/2014   lumbar spine/ fusion 1 level   BREAST BIOPSY Left 2011   neg   CARPAL TUNNEL RELEASE Right 12/2015   [facility]   CARPAL TUNNEL RELEASE Left 12/2016   COLONOSCOPY WITH PROPOFOL N/A 02/29/2020   Procedure: COLONOSCOPY WITH PROPOFOL;  Surgeon: [name], MD;  Location: [place];  Service: Endoscopy;  Laterality: N/A;   ESOPHAGOGASTRODUODENOSCOPY (EGD) WITH PROPOFOL N/A 02/29/2020   Procedure: ESOPHAGOGASTRODUODENOSCOPY (EGD) WITH PROPOFOL;  Surgeon: [name], MD;  Location: [place];  Service: Endoscopy;  Laterality: N/A;   FOOT SURGERY Right 2015   broken navicular bone   HIP ARTHROSCOPY Right 05/2011   HIP SURGERY     left hip surgery 05/2018   JOINT REPLACEMENT Bilateral 12/2017 & 05/2018   hip   L5-S1 OLIF Dr. Rennis Harding back surgery 09/2019  09/28/2019   left ganglion cyst removal     12/23/19 Dr. Elvina Mattes   MASS EXCISION Left 12/17/2019   Procedure: EXCISION TUMOR FOOT DEEP LEFT;  Surgeon: Albertine Patricia, DPM;  Location: [place];  Service: Podiatry;  Laterality: Left;  LMA W/LOCAL   NASAL TURBINATE REDUCTION Bilateral 06/12/2019   Procedure: TURBINATE REDUCTION/SUBMUCOSAL RESECTION;  Surgeon: [name], MD;  Location: [place];  Service: ENT;  Laterality: Bilateral;   REPLACEMENT TOTAL HIP W/  RESURFACING IMPLANTS Right 12/2017   [facility]  right shoulder surgery     11/28/20 for bone spur, torn labrum, re attached biceps tendon, tear Dr. [name]   ROTATOR CUFF REPAIR     Dr. [name] in 04/2019    SCLEROTHERAPY     veins in lower legs   SEPTOPLASTY Bilateral 06/12/2019   Procedure: SEPTOPLASTY;  Surgeon: [name], MD;  Location: [place];  Service: ENT;  Laterality: Bilateral;   SHOULDER SURGERY Right 12/2015   [facility] torn rotator cuff   SPINAL FUSION  2016   [facility] lumbar    SOCIAL HISTORY: Social History   Socioeconomic History    Marital status: Married    Spouse name: fredrick   Number of children: 1   Years of education: Not on file   Highest education level: High school graduate  Occupational History    Comment: full time  Tobacco Use   Smoking status: Never   Smokeless tobacco: Never  Vaping Use   Vaping Use: Never used  Substance and Sexual Activity   Alcohol use: Yes    Alcohol/week: 1.0 standard drink    Types: 1 Cans of beer per week    Comment: may have drink 1x/month   Drug use: Never   Sexual activity: Yes  Other Topics Concern   Not on file  Social History Narrative   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    Married 1 child    Lives at home with her husband   Right handed   Caffeine: maybe 1 cup/day (pepsi)   Social Determinants of Health   Financial Resource Strain: Not on file  Food Insecurity: Not on file  Transportation Needs: Not on file  Physical Activity: Not on file  Stress: Not on file  Social Connections: Not on file  Intimate Partner Violence: Not on file    FAMILY HISTORY: Family History  Problem Relation Age of Onset   Breast cancer Maternal Aunt    Breast cancer Paternal Aunt    Dementia Paternal Aunt    COPD Mother        quit smoking after 16 years   Arthritis Mother    Osteoarthritis Mother    Dementia Father        symptoms started in his 29s   Alzheimer's disease Father    Rheum arthritis Father    Rheum arthritis Other        FH   Dementia Other        "all my dad's side of the family"   Other Maternal Grandmother        got dementia as she got older    Dementia Paternal Grandmother     ALLERGIES:  is allergic to amoxicillin, septra [sulfamethoxazole-trimethoprim], and cephalexin.  MEDICATIONS:  Current Outpatient Medications  Medication Sig Dispense Refill   baclofen (LIORESAL) 10 MG tablet Take 10 mg by mouth 3 (three) times daily as needed for muscle spasms.     cetirizine (ZYRTEC) 10 MG tablet Take 10 mg by mouth daily.      dexlansoprazole (DEXILANT) 60 MG capsule Take 1 capsule (60 mg total) by mouth daily. 30 capsule 6   diclofenac Sodium (VOLTAREN) 1 % GEL Apply 2-4 g topically 4 (four) times daily. 2 gram upper body qid prn and 4 gram lower body qid prn 100 g 11   DULoxetine (CYMBALTA) 60 MG capsule TAKE 1 CAPSULE BY MOUTH EVERY DAY 90 capsule 1   estradiol (ESTRACE) 0.1 MG/GM vaginal cream every 14 (fourteen) days.  famotidine (PEPCID) 20 MG tablet TAKE 1 TABLET BY MOUTH EVERYDAY AT BEDTIME 90 tablet 0   fluticasone (FLONASE) 50 MCG/ACT nasal spray Place 1-2 sprays into both nostrils daily as needed. Max dose 2 sprays each nostril 16 g 12   hydrOXYzine (ATARAX/VISTARIL) 25 MG tablet Take 1-2 tablets (25-50 mg total) by mouth daily as needed for anxiety. 60 tablet 1   Melatonin 3 MG CAPS Take by mouth.     methocarbamol (ROBAXIN) 500 MG tablet Take 2 tablets (1,000 mg total) by mouth 2 (two) times daily as needed for muscle spasms. 60 tablet 2   ondansetron (ZOFRAN) 8 MG tablet Take 1 tablet (8 mg total) by mouth every 8 (eight) hours as needed for nausea or vomiting. 40 tablet 2   oxyCODONE-acetaminophen (PERCOCET) 10-325 MG tablet Take 1 tablet by mouth every 6 (six) hours as needed.     pregabalin (LYRICA) 50 MG capsule TAKE 1 TO 2 CAPSULES BY MOUTH EVERY 12 HOURS AS NEEDED     RIVAROXABAN (XARELTO) VTE STARTER PACK (15 & 20 MG) Follow package directions: Take one 15mg  tablet by mouth twice a day. On day 22, switch to one 20mg  tablet once a day. Take with food. 51 each 0   traZODone (DESYREL) 100 MG tablet Take 2 tablets (200 mg total) by mouth at bedtime as needed for sleep. 180 tablet 1   valACYclovir (VALTREX) 1000 MG tablet as needed. Oral blister     doxycycline (VIBRAMYCIN) 100 MG capsule Take 100 mg by mouth 2 (two) times daily.     Elastic Bandages & Supports (RELIEF KNEE) MISC Apply in the morning and remove at night. (Patient not taking: No sig reported)     fluconazole (DIFLUCAN) 150 MG tablet Take  by mouth. (Patient not taking: Reported on 12/28/2020)     linaclotide (LINZESS) 145 MCG CAPS capsule Take 1 capsule (145 mcg total) by mouth daily before breakfast. (Patient not taking: Reported on 12/14/2020) 30 capsule 0   phentermine (ADIPEX-P) 37.5 MG tablet Take 1 tablet (37.5 mg total) by mouth daily before breakfast. rx 1/2 (Patient not taking: No sig reported) 60 tablet 0   rivaroxaban (XARELTO) 20 MG TABS tablet Take 1 tablet (20 mg total) by mouth daily. (Patient not taking: No sig reported) 90 tablet 0   No current facility-administered medications for this visit.     PHYSICAL EXAMINATION: ECOG PERFORMANCE STATUS: 0 - Asymptomatic Vitals:   12/28/20 1147  BP: 107/75  Pulse: 73  Resp: 16  Temp: (!) 97.2 F (36.2 C)   Filed Weights   12/28/20 1147  Weight: 205 lb 8 oz (93.2 kg)    Physical Exam Constitutional:      General: She is not in acute distress. HENT:     Head: Normocephalic and atraumatic.  Eyes:     General: No scleral icterus.    Pupils: Pupils are equal, round, and reactive to light.  Cardiovascular:     Rate and Rhythm: Normal rate and regular rhythm.     Heart sounds: Normal heart sounds.  Pulmonary:     Effort: Pulmonary effort is normal. No respiratory distress.     Breath sounds: No wheezing.  Abdominal:     General: Bowel sounds are normal. There is no distension.     Palpations: Abdomen is soft. There is no mass.     Tenderness: There is no abdominal tenderness.  Musculoskeletal:        General: No deformity. Normal range of motion.  Cervical back: Normal range of motion and neck supple.  Skin:    General: Skin is warm and dry.     Findings: No erythema or rash.  Neurological:     Mental Status: She is alert and oriented to person, place, and time.     Cranial Nerves: No cranial nerve deficit.     Coordination: Coordination normal.  Psychiatric:        Behavior: Behavior normal.        Thought Content: Thought content normal.      LABORATORY DATA:  I have reviewed the data as listed Lab Results  Component Value Date   WBC 4.3 09/22/2020   HGB 13.8 09/22/2020   HCT 40.2 09/22/2020   MCV 92.5 09/22/2020   PLT 391.0 09/22/2020   Recent Labs    06/19/20 1135 09/22/20 1026 12/28/20 1217  NA 136 139 139  K 3.7 4.0 3.9  CL 104 107 105  CO2 21* 26 27  GLUCOSE 111* 94 98  BUN 24* 22 18  CREATININE 0.69 0.85 0.65  CALCIUM 9.1 9.6 8.9  GFRNONAA >60  --  >60  PROT 7.3 7.2  --   ALBUMIN 4.1 4.2  --   AST 20 15  --   ALT 19 28  --   ALKPHOS 68 75  --   BILITOT 0.5 0.5  --     Iron/TIBC/Ferritin/ %Sat    Component Value Date/Time   IRON 77 09/25/2019 0950   TIBC 336 09/25/2019 0950   FERRITIN 37 09/25/2019 0950   IRONPCTSAT 23 09/25/2019 0950      ASSESSMENT & PLAN:  1. Acute deep vein thrombosis (DVT) of femoral vein of left lower extremity (HCC)    Probably provoked events secondary to recent right shoulder procedure and decreased mobility, although patient reports that right shoulder procedure did not take long, and she does not feel that mobility was much decreased due to the procedure.Other immobility factors were determined. I agree with anticoagulation with Xarelto.  Recommend 3 months of anticoagulation and will repeat left extremity venous ultrasound.-Per patient her vascular surgeon plans to order.  Her vascular surgery recommend patient to stay on Xarelto low-dose maintenance eventually given that this is a recurrent DVT.  I recommend to check BMP, factor V Leiden mutation and prothrombin gene mutation. After she finishes 3 months of anticoagulation, she may take 1 to 2 days of and have antiphospholipid syndrome panel  Recommend age-appropriate cancer screening - patient reports that she is up-to-date for her mammogram and colonoscopy.  she agrees with the plan.  Orders Placed This Encounter  Procedures   Factor 5 leiden    Standing Status:   Future    Number of Occurrences:   1     Standing Expiration Date:   12/28/2021   Prothrombin gene mutation    Standing Status:   Future    Number of Occurrences:   1    Standing Expiration Date:   5/97/4163   Basic metabolic panel    Standing Status:   Future    Number of Occurrences:   1    Standing Expiration Date:   12/28/2021   CBC with Differential/Platelet    Standing Status:   Future    Standing Expiration Date:   12/28/2021   Comprehensive metabolic panel    Standing Status:   Future    Standing Expiration Date:   12/28/2021   ANTIPHOSPHOLIPID SYNDROME PROF    Standing Status:   Future  Standing Expiration Date:   12/28/2021    All questions were answered. The patient knows to call the clinic with any problems questions or concerns.  Cc McLean-Scocuzza, Olivia Mackie *  Return of visit: Labs MD in 13 to 14 weeks Thank you for this kind referral and the opportunity to participate in the care of this patient. A copy of today's note is routed to referring provider    Earlie Server, MD, PhD 12/28/2020

## 2020-12-28 NOTE — Progress Notes (Signed)
Patient here for DVT evaluation after recent DVT.

## 2020-12-28 NOTE — Telephone Encounter (Signed)
dexlansoprazole (DEXILANT) 60 MG capsule  Per insurance company needs prior auth. # [id number] (T) [employer]  [facility] handles her Rx

## 2021-01-03 LAB — PROTHROMBIN GENE MUTATION

## 2021-01-03 LAB — FACTOR 5 LEIDEN

## 2021-01-04 ENCOUNTER — Encounter: Payer: Self-pay | Admitting: Internal Medicine

## 2021-01-18 ENCOUNTER — Telehealth: Payer: 59 | Admitting: Psychiatry

## 2021-01-20 ENCOUNTER — Telehealth: Payer: Self-pay | Admitting: *Deleted

## 2021-01-20 NOTE — Telephone Encounter (Signed)
Please place future orders for lab appt.  

## 2021-01-23 ENCOUNTER — Other Ambulatory Visit (INDEPENDENT_AMBULATORY_CARE_PROVIDER_SITE_OTHER): Payer: 59

## 2021-01-23 ENCOUNTER — Other Ambulatory Visit: Payer: Self-pay

## 2021-01-23 DIAGNOSIS — E785 Hyperlipidemia, unspecified: Secondary | ICD-10-CM

## 2021-01-23 DIAGNOSIS — D72821 Monocytosis (symptomatic): Secondary | ICD-10-CM

## 2021-01-23 LAB — CBC WITH DIFFERENTIAL/PLATELET
Basophils Absolute: 0.1 K/uL (ref 0.0–0.1)
Basophils Relative: 1.2 % (ref 0.0–3.0)
Eosinophils Absolute: 0.1 K/uL (ref 0.0–0.7)
Eosinophils Relative: 2.3 % (ref 0.0–5.0)
HCT: 38.2 % (ref 36.0–46.0)
Hemoglobin: 12.7 g/dL (ref 12.0–15.0)
Lymphocytes Relative: 38 % (ref 12.0–46.0)
Lymphs Abs: 1.7 K/uL (ref 0.7–4.0)
MCHC: 33.4 g/dL (ref 30.0–36.0)
MCV: 95.9 fl (ref 78.0–100.0)
Monocytes Absolute: 0.4 K/uL (ref 0.1–1.0)
Monocytes Relative: 8 % (ref 3.0–12.0)
Neutro Abs: 2.3 K/uL (ref 1.4–7.7)
Neutrophils Relative %: 50.5 % (ref 43.0–77.0)
Platelets: 330 K/uL (ref 150.0–400.0)
RBC: 3.98 Mil/uL (ref 3.87–5.11)
RDW: 14 % (ref 11.5–15.5)
WBC: 4.6 K/uL (ref 4.0–10.5)

## 2021-01-23 LAB — LIPID PANEL
Cholesterol: 226 mg/dL — ABNORMAL HIGH (ref 0–200)
HDL: 68.1 mg/dL (ref >39.00)
LDL Cholesterol: 135 mg/dL — ABNORMAL HIGH (ref 0–99)
NonHDL: 157.76
Total CHOL/HDL Ratio: 3
Triglycerides: 113 mg/dL (ref 0.0–149.0)
VLDL: 22.6 mg/dL (ref 0.0–40.0)

## 2021-01-23 NOTE — Telephone Encounter (Signed)
Thank you for the orders could not log on until ~10 am from home though been up since 5:30 am

## 2021-02-01 ENCOUNTER — Encounter: Payer: Self-pay | Admitting: Internal Medicine

## 2021-02-02 ENCOUNTER — Telehealth: Payer: Self-pay | Admitting: Gastroenterology

## 2021-02-02 NOTE — Telephone Encounter (Signed)
dexlansoprazole (DEXILANT) 60 MG capsule  Needs prior auth w/ [facility]  90 days  1 day turn around using this number # [id number], she is almost out of this medication.

## 2021-02-02 NOTE — Telephone Encounter (Signed)
Please advise, did not see a referral in the system to this provider

## 2021-02-02 NOTE — Addendum Note (Signed)
Addended by: Orland Mustard on: 02/02/2021 07:34 AM   Modules accepted: Orders

## 2021-02-03 NOTE — Telephone Encounter (Signed)
Please advise 

## 2021-02-23 ENCOUNTER — Other Ambulatory Visit: Payer: Self-pay

## 2021-02-23 ENCOUNTER — Other Ambulatory Visit: Payer: Self-pay | Admitting: Internal Medicine

## 2021-02-23 DIAGNOSIS — K219 Gastro-esophageal reflux disease without esophagitis: Secondary | ICD-10-CM

## 2021-02-23 DIAGNOSIS — E669 Obesity, unspecified: Secondary | ICD-10-CM

## 2021-02-23 NOTE — Telephone Encounter (Signed)
Received a refill request for Phentermine.  Pt last seen on 01/04/2021 Phentermine last refilled on 07/26/2020

## 2021-02-28 ENCOUNTER — Other Ambulatory Visit: Payer: Self-pay

## 2021-02-28 DIAGNOSIS — J309 Allergic rhinitis, unspecified: Secondary | ICD-10-CM

## 2021-02-28 MED ORDER — FLUTICASONE PROPIONATE 50 MCG/ACT NA SUSP: 1.0000 | Freq: Every day | NASAL | 12 refills | Status: DC | PRN

## 2021-03-01 ENCOUNTER — Telehealth (INDEPENDENT_AMBULATORY_CARE_PROVIDER_SITE_OTHER): Payer: 59 | Admitting: Psychiatry

## 2021-03-01 ENCOUNTER — Other Ambulatory Visit: Payer: Self-pay

## 2021-03-01 ENCOUNTER — Encounter: Payer: Self-pay | Admitting: Psychiatry

## 2021-03-01 DIAGNOSIS — F411 Generalized anxiety disorder: Secondary | ICD-10-CM | POA: Diagnosis not present

## 2021-03-01 DIAGNOSIS — G4701 Insomnia due to medical condition: Secondary | ICD-10-CM | POA: Diagnosis not present

## 2021-03-01 NOTE — Progress Notes (Signed)
Virtual Visit via Video Note  I connected with [name] on 03/01/21 at 11:00 AM EDT by a video enabled telemedicine application and verified that I am speaking with the correct person using two identifiers.  Location Provider Location : [facility] Patient Location : Home  Participants: Patient , Provider   I discussed the limitations of evaluation and management by telemedicine and the availability of in person appointments. The patient expressed understanding and agreed to proceed.    I discussed the assessment and treatment plan with the patient. The patient was provided an opportunity to ask questions and all were answered. The patient agreed with the plan and demonstrated an understanding of the instructions.   The patient was advised to call back or seek an in-person evaluation if the symptoms worsen or if the condition fails to improve as anticipated.   [facility] MD OP Progress Note  03/01/2021 4:38 PM LUTRICIA HICKSON  MRN:  [id number]  Chief Complaint:  Chief Complaint   Follow-up; Depression; Anxiety    HPI: NIKYRA BAIRD is a 55 year old Caucasian female who has a history of GAD, insomnia, chronic pain, primarily osteoarthritis, lumbar radiculopathy, degenerative disc disease, spondylolisthesis, bilateral carpal tunnel syndrome was evaluated by telemedicine today.  Patient evaluated in the emergency department in June, for acute DVT, currently on Xarelto.  Patient reports she continues to be in severe pain especially of her back.  She also has foot pain and has upcoming appointment with neurosurgeon for evaluation.  She currently rates her pain at a 5 out of 10.  She was recently started on Belbuca.  She continues to be on Lyrica dosage increased to 200 mg daily.  In spite of taking all these medications she continues to have pain which is not controlled.  This does affect her ability to function.  Patient reports overall mood is okay.  She is compliant on Cymbalta.  Denies side  effects.  Patient denies suicidality, homicidality or perceptual disturbances.  Patient denies any other concerns today.     Visit Diagnosis:    ICD-10-CM   1. GAD (generalized anxiety disorder)  F41.1     2. Insomnia due to medical condition  G47.01    pain      Past Psychiatric History: Reviewed past psychiatric history from progress note on 04/30/2018.  Past Medical History:  Past Medical History:  Diagnosis Date   Anxiety    Arthritis    osteoarthritis-joints, feet, shoulder   Asthma    previously diagnosed after a bout of bronchitis/ no problems since   Bursitis    b/l hips   Chronic pain syndrome    COPD (chronic obstructive pulmonary disease) ([place]) 2004   patient states probably bronchitis   Depression    DVT (deep venous thrombosis) ([place]) 2012   right leg. after surgery, while on BCP   GERD (gastroesophageal reflux disease)    Hyperlipidemia    Left leg DVT ([place])    fem/pop 12/07/20 after right shoulder surgery 11/30/20   Neuropathy    post surgery   PVD (peripheral vascular disease) ([place])    Spondylolisthesis     Past Surgical History:  Procedure Laterality Date   ABLATION     veins in lower legs   BACK SURGERY  07/2014   lumbar spine/ fusion 1 level   BREAST BIOPSY Left 2011   neg   CARPAL TUNNEL RELEASE Right 12/2015   [facility]   CARPAL TUNNEL RELEASE Left 12/2016   COLONOSCOPY WITH PROPOFOL  N/A 02/29/2020   Procedure: COLONOSCOPY WITH PROPOFOL;  Surgeon: [name], MD;  Location: [facility];  Service: Endoscopy;  Laterality: N/A;   ESOPHAGOGASTRODUODENOSCOPY (EGD) WITH PROPOFOL N/A 02/29/2020   Procedure: ESOPHAGOGASTRODUODENOSCOPY (EGD) WITH PROPOFOL;  Surgeon: [name], MD;  Location: [facility];  Service: Endoscopy;  Laterality: N/A;   FOOT SURGERY Right 2015   broken navicular bone   HIP ARTHROSCOPY Right 05/2011   HIP SURGERY     left hip surgery 05/2018   JOINT REPLACEMENT Bilateral 12/2017 & 05/2018   hip    L5-S1 OLIF Dr. Rennis Harding back surgery 09/2019  09/28/2019   left ganglion cyst removal     12/23/19 Dr. Elvina Mattes   MASS EXCISION Left 12/17/2019   Procedure: EXCISION TUMOR FOOT DEEP LEFT;  Surgeon: Albertine Patricia, DPM;  Location: [facility];  Service: Podiatry;  Laterality: Left;  LMA W/LOCAL   NASAL TURBINATE REDUCTION Bilateral 06/12/2019   Procedure: TURBINATE REDUCTION/SUBMUCOSAL RESECTION;  Surgeon: [name], MD;  Location: [facility];  Service: ENT;  Laterality: Bilateral;   REPLACEMENT TOTAL HIP W/  RESURFACING IMPLANTS Right 12/2017   [facility]   right shoulder surgery     11/28/20 for bone spur, torn labrum, re attached biceps tendon, tear Dr. [name]   ROTATOR CUFF REPAIR     Dr. [name] in 04/2019    SCLEROTHERAPY     veins in lower legs   SEPTOPLASTY Bilateral 06/12/2019   Procedure: SEPTOPLASTY;  Surgeon: [name], MD;  Location: [facility];  Service: ENT;  Laterality: Bilateral;   SHOULDER SURGERY Right 12/2015   [facility] torn rotator cuff   SPINAL FUSION  2016   [facility] lumbar    Family Psychiatric History: Reviewed family psychiatric history from progress note on 04/30/2018.  Family History:  Family History  Problem Relation Age of Onset   Breast cancer Maternal Aunt    Breast cancer Paternal Aunt    Dementia Paternal Aunt    COPD Mother        quit smoking after 17 years   Arthritis Mother    Osteoarthritis Mother    Dementia Father        symptoms started in his 98s   Alzheimer's disease Father    Rheum arthritis Father    Rheum arthritis Other        FH   Dementia Other        "all my dad's side of the family"   Other Maternal Grandmother        got dementia as she got older    Dementia Paternal Grandmother     Social History: Reviewed social history from progress note on 04/30/2018. Social History   Socioeconomic History   Marital status: Married    Spouse name: fredrick   Number of children: 1    Years of education: Not on file   Highest education level: High school graduate  Occupational History    Comment: full time  Tobacco Use   Smoking status: Never   Smokeless tobacco: Never  Vaping Use   Vaping Use: Never used  Substance and Sexual Activity   Alcohol use: Yes    Alcohol/week: 1.0 standard drink    Types: 1 Cans of beer per week    Comment: may have drink 1x/month   Drug use: Never   Sexual activity: Yes  Other Topics Concern   Not on file  Social History Narrative   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  and [name]    Married 1 child    Lives at home with her husband   Right handed   Caffeine: maybe 1 cup/day (pepsi)   Social Determinants of Health   Financial Resource Strain: Not on file  Food Insecurity: Not on file  Transportation Needs: Not on file  Physical Activity: Not on file  Stress: Not on file  Social Connections: Not on file    Allergies:  Allergies  Allergen Reactions   Amoxicillin Hives   Septra [Sulfamethoxazole-Trimethoprim] Hives        Cephalexin Rash         Metabolic Disorder Labs: Lab Results  Component Value Date   HGBA1C 5.5 09/25/2019   No results found for: PROLACTIN Lab Results  Component Value Date   CHOL 226 (H) 01/23/2021   TRIG 113.0 01/23/2021   HDL 68.10 01/23/2021   CHOLHDL 3 01/23/2021   VLDL 22.6 01/23/2021   LDLCALC 135 (H) 01/23/2021   LDLCALC 151 (H) 09/22/2020   Lab Results  Component Value Date   TSH 1.49 09/22/2020   TSH 3.78 01/05/2019    Therapeutic Level Labs: No results found for: LITHIUM No results found for: VALPROATE No components found for:  CBMZ  Current Medications: Current Outpatient Medications  Medication Sig Dispense Refill   neomycin-polymyxin-dexameth (MAXITROL) 0.1 % OINT neomycin 3.5 mg/g-polymyxin B 10,000 unit/g-dexameth 0.1 % eye oint  APPLY TO AFFECTED AREA TWICE DAILY FOR 10 DAYS     baclofen (LIORESAL) 10 MG tablet Take 10 mg by mouth 3 (three) times daily as  needed for muscle spasms.     BELBUCA 450 MCG FILM SMARTSIG:1 Strip(s) By Mouth Every 12 Hours PRN     benzonatate (TESSALON) 200 MG capsule benzonatate 200 mg capsule  TK 1 C PO TID PRF COUGH     cetirizine (ZYRTEC) 10 MG tablet Take 10 mg by mouth daily.     dexlansoprazole (DEXILANT) 60 MG capsule Take 1 capsule (60 mg total) by mouth daily. 30 capsule 6   diclofenac Sodium (VOLTAREN) 1 % GEL Apply 2-4 g topically 4 (four) times daily. 2 gram upper body qid prn and 4 gram lower body qid prn 100 g 11   doxycycline (VIBRAMYCIN) 100 MG capsule Take 100 mg by mouth 2 (two) times daily.     DULoxetine (CYMBALTA) 60 MG capsule TAKE 1 CAPSULE BY MOUTH EVERY DAY 90 capsule 1   Elastic Bandages & Supports (RELIEF KNEE) MISC Apply in the morning and remove at night. (Patient not taking: No sig reported)     estradiol (ESTRACE) 0.1 MG/GM vaginal cream every 14 (fourteen) days.     famotidine (PEPCID) 20 MG tablet TAKE 1 TABLET BY MOUTH EVERYDAY AT BEDTIME 90 tablet 0   fluconazole (DIFLUCAN) 150 MG tablet Take by mouth. (Patient not taking: Reported on 12/28/2020)     fluticasone (FLONASE) 50 MCG/ACT nasal spray Place 1-2 sprays into both nostrils daily as needed. Max dose 2 sprays each nostril 16 g 12   hydrOXYzine (ATARAX/VISTARIL) 25 MG tablet Take 1-2 tablets (25-50 mg total) by mouth daily as needed for anxiety. 60 tablet 1   linaclotide (LINZESS) 145 MCG CAPS capsule Take 1 capsule (145 mcg total) by mouth daily before breakfast. (Patient not taking: Reported on 12/14/2020) 30 capsule 0   Melatonin 3 MG CAPS Take by mouth.     methocarbamol (ROBAXIN) 500 MG tablet Take 2 tablets (1,000 mg total) by mouth 2 (two) times daily as needed for muscle spasms.  60 tablet 2   ondansetron (ZOFRAN) 8 MG tablet Take 1 tablet (8 mg total) by mouth every 8 (eight) hours as needed for nausea or vomiting. 40 tablet 2   oxyCODONE-acetaminophen (PERCOCET) 10-325 MG tablet Take 1 tablet by mouth every 6 (six) hours as  needed.     phentermine (ADIPEX-P) 37.5 MG tablet Take 1 tablet (37.5 mg total) by mouth daily before breakfast. rx 1/2 (Patient not taking: No sig reported) 60 tablet 0   pregabalin (LYRICA) 50 MG capsule 200 mg.     rivaroxaban (XARELTO) 20 MG TABS tablet Take 1 tablet (20 mg total) by mouth daily. (Patient not taking: No sig reported) 90 tablet 0   RIVAROXABAN (XARELTO) VTE STARTER PACK (15 & 20 MG) Follow package directions: Take one '15mg'$  tablet by mouth twice a day. On day 22, switch to one '20mg'$  tablet once a day. Take with food. 51 each 0   traZODone (DESYREL) 100 MG tablet Take 2 tablets (200 mg total) by mouth at bedtime as needed for sleep. 180 tablet 1   valACYclovir (VALTREX) 1000 MG tablet as needed. Oral blister     No current facility-administered medications for this visit.     Musculoskeletal: Strength & Muscle Tone:  UTA Gait & Station:  UTA Patient leans: N/A  Psychiatric Specialty Exam: Review of Systems  Musculoskeletal:  Positive for back pain.       Left foot pain  Psychiatric/Behavioral:  The patient is nervous/anxious.   All other systems reviewed and are negative.  There were no vitals taken for this visit.There is no height or weight on file to calculate BMI.  General Appearance: Casual  Eye Contact:  Good  Speech:  Clear and Coherent  Volume:  Normal  Mood:  Anxious coping well  Affect:  Congruent  Thought Process:  Goal Directed and Descriptions of Associations: Intact  Orientation:  Full (Time, Place, and Person)  Thought Content: Logical   Suicidal Thoughts:  No  Homicidal Thoughts:  No  Memory:  Immediate;   Fair Recent;   Fair Remote;   Fair  Judgement:  Fair  Insight:  Fair  Psychomotor Activity:  Normal  Concentration:  Concentration: Good and Attention Span: Good  Recall:  Good  Fund of Knowledge: Good  Language: Fair  Akathisia:  No  Handed:  Right  AIMS (if indicated): not done  Assets:  Communication Skills Desire for  [place] Talents/Skills Transportation  ADL's:  Intact  Cognition: WNL  Sleep:  Fair   Screenings: GAD-7    Flowsheet Row Video Visit from 03/01/2021 in [facility] Video Visit from 10/19/2020 in [facility] Video Visit from 09/21/2020 in [facility] Video Visit from 10/14/2019 in [facility]  Total GAD-7 Score '3 2 4 1      '$ [place] Visit from 09/23/2019 in [facility]  Total Score (max 30 points ) 27      PHQ2-9    Flowsheet Row Video Visit from 03/01/2021 in [facility] Video Visit from 09/21/2020 in [facility] Video Visit from 10/14/2019 in [facility] Office Visit from 08/14/2019 in [facility] Office Visit from 07/07/2019 in [facility]  PHQ-2 Total Score 0 1 0 0 0  PHQ-9 Total Score -- -- -- 0 --      [facility] from 12/07/2020 in [place]  [place] Video Visit from 09/21/2020 in [facility] CATEGORY No Risk No Risk        Assessment and Plan: CHERISE SKINNER is a 55 year old Caucasian female, married, on SSD, lives in [place], has a history of GAD, insomnia, chronic pain, osteoarthritis, degenerative disc disease, lumbar radiculopathy, recent shoulder surgery, multiple surgeries in the past.  Patient continues to struggle with pain, was recently diagnosed with DVT, currently on Xarelto.  Patient will continue to need pain management.  Continue plan as noted below.  Plan GAD-stable Cymbalta 60 mg p.o. daily Hydroxyzine 25-50 mg p.o. daily as needed for severe anxiety. Provided information for pain psychologist.  Insomnia-improving Trazodone 150-200 mg p.o. nightly as needed Melatonin 3 mg p.o.  nightly  Follow-up in clinic in 3 months or sooner if needed.  This note was generated in part or whole with voice recognition software. Voice recognition is usually quite accurate but there are transcription errors that can and very often do occur. I apologize for any typographical errors that were not detected and corrected.       Ursula Alert, MD 03/01/2021, 4:38 PM

## 2021-03-14 ENCOUNTER — Telehealth: Payer: Self-pay | Admitting: Gastroenterology

## 2021-03-14 NOTE — Telephone Encounter (Signed)
dexlansoprazole (DEXILANT) 60 MG capsule  [facility] on [address]  90 day refill

## 2021-03-15 ENCOUNTER — Other Ambulatory Visit: Payer: Self-pay

## 2021-03-15 ENCOUNTER — Telehealth: Payer: Self-pay | Admitting: Gastroenterology

## 2021-03-15 DIAGNOSIS — R4586 Emotional lability: Secondary | ICD-10-CM | POA: Insufficient documentation

## 2021-03-15 DIAGNOSIS — N898 Other specified noninflammatory disorders of vagina: Secondary | ICD-10-CM | POA: Insufficient documentation

## 2021-03-15 HISTORY — DX: Emotional lability: R45.86

## 2021-03-15 MED ORDER — DEXLANSOPRAZOLE 60 MG PO CPDR: 60.0000 mg | DELAYED_RELEASE_CAPSULE | Freq: Every day | ORAL | 1 refills | Status: DC

## 2021-03-15 NOTE — Telephone Encounter (Signed)
Dexilant refill has been sent to [facility] [address] [place] per pt request. Tried contacting pt but had to leave a voicemail.

## 2021-03-15 NOTE — Telephone Encounter (Signed)
Returned pt's call regarding her refill on the [facility]. Pt requesting the 90 day supply to go to [facility] [place].

## 2021-03-15 NOTE — Telephone Encounter (Signed)
Pt. Requesting a phone call about a change of pharmacies. She said she needs a refill for dexlansoprazole (DEXILANT) 60 MG capsule , 90 day. She said this is urgent and she is requesting a call back

## 2021-03-15 NOTE — Telephone Encounter (Signed)
Pt. Returning call says she was leaving another Dr. Hilaria Ota. Requesting call back

## 2021-03-29 ENCOUNTER — Inpatient Hospital Stay (HOSPITAL_BASED_OUTPATIENT_CLINIC_OR_DEPARTMENT_OTHER): Payer: 59 | Admitting: Oncology

## 2021-03-29 ENCOUNTER — Encounter: Payer: Self-pay | Admitting: Oncology

## 2021-03-29 ENCOUNTER — Inpatient Hospital Stay: Payer: 59 | Attending: Oncology

## 2021-03-29 VITALS — BP 120/78 | HR 64 | Temp 96.6°F | Resp 17 | Wt 206.0 lb

## 2021-03-29 DIAGNOSIS — I82412 Acute embolism and thrombosis of left femoral vein: Secondary | ICD-10-CM

## 2021-03-29 DIAGNOSIS — Z86718 Personal history of other venous thrombosis and embolism: Secondary | ICD-10-CM | POA: Diagnosis not present

## 2021-03-29 DIAGNOSIS — D6851 Activated protein C resistance: Secondary | ICD-10-CM | POA: Insufficient documentation

## 2021-03-29 DIAGNOSIS — Z7901 Long term (current) use of anticoagulants: Secondary | ICD-10-CM | POA: Insufficient documentation

## 2021-03-29 LAB — COMPREHENSIVE METABOLIC PANEL
ALT: 18 U/L (ref 0–44)
AST: 19 U/L (ref 15–41)
Albumin: 4 g/dL (ref 3.5–5.0)
Alkaline Phosphatase: 67 U/L (ref 38–126)
Anion gap: 5 (ref 5–15)
BUN: 21 mg/dL — ABNORMAL HIGH (ref 6–20)
CO2: 30 mmol/L (ref 22–32)
Calcium: 8.9 mg/dL (ref 8.9–10.3)
Chloride: 105 mmol/L (ref 98–111)
Creatinine, Ser: 0.93 mg/dL (ref 0.44–1.00)
GFR, Estimated: >60 mL/min (ref >60)
Glucose, Bld: 83 mg/dL (ref 70–99)
Potassium: 4 mmol/L (ref 3.5–5.1)
Sodium: 140 mmol/L (ref 135–145)
Total Bilirubin: 0.6 mg/dL (ref 0.3–1.2)
Total Protein: 7.2 g/dL (ref 6.5–8.1)

## 2021-03-29 LAB — CBC WITH DIFFERENTIAL/PLATELET
Abs Immature Granulocytes: 0.01 10*3/uL (ref 0.00–0.07)
Basophils Absolute: 0 10*3/uL (ref 0.0–0.1)
Basophils Relative: 1 %
Eosinophils Absolute: 0.1 10*3/uL (ref 0.0–0.5)
Eosinophils Relative: 1 %
HCT: 41.1 % (ref 36.0–46.0)
Hemoglobin: 13.2 g/dL (ref 12.0–15.0)
Immature Granulocytes: 0 %
Lymphocytes Relative: 32 %
Lymphs Abs: 2.3 10*3/uL (ref 0.7–4.0)
MCH: 31.1 pg (ref 26.0–34.0)
MCHC: 32.1 g/dL (ref 30.0–36.0)
MCV: 96.7 fL (ref 80.0–100.0)
Monocytes Absolute: 0.5 10*3/uL (ref 0.1–1.0)
Monocytes Relative: 7 %
Neutro Abs: 4.1 10*3/uL (ref 1.7–7.7)
Neutrophils Relative %: 59 %
Platelets: 308 10*3/uL (ref 150–400)
RBC: 4.25 MIL/uL (ref 3.87–5.11)
RDW: 13 % (ref 11.5–15.5)
WBC: 7 10*3/uL (ref 4.0–10.5)
nRBC: 0 % (ref 0.0–0.2)

## 2021-03-29 NOTE — Progress Notes (Signed)
Patient here for oncology follow-up appointment, concerns of leg weakness left leg swelling

## 2021-03-29 NOTE — Progress Notes (Signed)
Hematology/Oncology note [facility] Telephone:([phone] [phone]   Patient Care Team: McLean-Scocuzza, Nino Glow, MD as PCP - General (Internal Medicine)  REFERRING PROVIDER: McLean-Scocuzza, Olivia Mackie *  CHIEF COMPLAINTS/REASON FOR VISIT:  lower extremity DVT  PERTINENT HEMATOLOGY HISTORY  Patient was previously seen for thrombocytosis.  Reviewed patient's previous labs. Thrombocytosis onset is acute onset.  Patient has labs done at outside facility [facility] healthcare system.  Labs are reviewed via care everywhere. 05/21/2018, hemoglobin 11, hematocrit 32.9, platelet count 304, leukocytosis with a white count of 18.8, absolute neutrophils 16.2, absolute monocyte 1.5 05/15/2018, hemoglobin 13.5, platelet 412, white count 9.  Normal differential. 01/02/2018, hemoglobin 12, platelet 600, neutrophil was 17.2, WBC 10.4, absolute monocyte 1.4.  She also had history of arthritis.  Recently had CT lumbar spine without contrast done at [facility] on 01/13/2019 [place] of bilateral laminectomies with posterior instrumented fusion at L4-L5. The left-sided L4 pedicle screw tip is located outside of the vertebral body. No other evidence for hardware complication or failure. Osseous fusion of the right L4 and L5 articular processes. 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   07/30/2018 screening bilateral mammogram shows no mammographic evidence of malignancy.  No aggravating or elevated factors. Associated symptoms or signs:  Denies weight loss, fever, chills, fatigue, night sweats.  Context:  Smoking history: Denies any smoking history. Family history of polycythemia.  Denies History of iron deficiency anemia; denies History of DVT; remote provoked DVT of right lower extremity after right total hip replacement.  On aspirin 81 mg and compression therapy. Reports that she takes "injections "for about a few weeks. 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 Patient follows up with vascular surgery Dr. Bayard Males for vein insufficiency.   # Patient has a remote history of provoked right lower extremity DVT secondary to right lower extremity procedure in 2022.  At that time she was on oral contraceptives.  Patient was treated with Xarelto. She also has a history of chronic left low extremity pain status post left deep peroneal neurectomy/neurolysis with neurosurgery Dr. Tyler Pita in November 2021.  11/09/2020, patient received a left lower extremity injection with 40 mg Kenalog and 4% lidocaine. She recently had elective right shoulder rotator cuff repair  procedure.  A few days after the procedure, patient developed left lower leg calf tenderness and swelling with no known trauma or injury.  [facility] and had ultrasound done for evaluation.  12/07/2020, venous ultrasound of the left lower extremity showed occlusive DVT extending from the distal aspect of the left femoral vein through the popliteal vein to involve the imaged tibial veins. Patient went to emergency room and was started on Xarelto starting daily.  She is about to finish 15 mg twice daily and going on 20 mg daily.  CT chest was not obtained due to [place] of respiratory symptoms, tachycardia and hypoxia. Patient reports tolerating the anticoagulation very well with no bleeding events.  Left lower extremity pain and tenderness have improved.  However she continues to feel left lower extremity pressure with prolonged standing.  She frequently elevate her legs. 12/14/2020 patient was seen via telemedicine by primary care provider for nasal congestion going to chest since 11/30/2020 and was treated with a course of doxycycline, Tessalon after urgent care visit on 12/07/2020.  She was not tested for COVID-19.  She has had COVID-19 vaccinations.  Patient  was diagnosed with frontal  sinusitis and treated with antibiotics.  She also has some chest tightness which has gotten better.  INTERVAL HISTORY Maureen Randall is a 55 y.o. female who has above history reviewed by me today presents for reestablish care for left lower extremity DVT. She is finished 3 months of Xarelto 20mg  daily. She tolerates well.  Her appt with vascular surgeon is delayed to next months. Repeat US will be done during her visit.   Review of Systems  Constitutional:  Negative for appetite change, chills, fatigue and fever.  HENT:   Negative for hearing loss and voice change.   Eyes:  Negative for eye problems.  Respiratory:  Negative for chest tightness and cough.   Cardiovascular:  Negative for chest pain.  Gastrointestinal:  Negative for abdominal distention, abdominal pain and blood in stool.  Endocrine: Negative for hot flashes.  Genitourinary:  Negative for difficulty urinating and frequency.   Musculoskeletal:  Negative for arthralgias.       Left low extremity pressure  Skin:  Negative for itching and rash.  Neurological:  Negative for extremity weakness.  Hematological:  Negative for adenopathy.  Psychiatric/Behavioral:  Negative for confusion.    MEDICAL HISTORY:  Past Medical History:  Diagnosis Date   Anxiety    Arthritis    osteoarthritis-joints, feet, shoulder   Asthma    previously diagnosed after a bout of bronchitis/ no problems since   Bursitis    b/l hips   Chronic pain syndrome    COPD (chronic obstructive pulmonary disease) ([place]) 2004   patient states probably bronchitis   Depression    DVT (deep venous thrombosis) ([place]) 2012   right leg. after surgery, while on BCP   GERD (gastroesophageal reflux disease)    Hyperlipidemia    Left leg DVT ([place])    fem/pop 12/07/20 after right shoulder surgery 11/30/20   Neuropathy    post surgery   PVD (peripheral vascular disease) ([place])    Spondylolisthesis     SURGICAL HISTORY: Past Surgical History:  Procedure Laterality  Date   ABLATION     veins in lower legs   BACK SURGERY  07/2014   lumbar spine/ fusion 1 level   BREAST BIOPSY Left 2011   neg   CARPAL TUNNEL RELEASE Right 12/2015   [facility]   CARPAL TUNNEL RELEASE Left 12/2016   COLONOSCOPY WITH PROPOFOL N/A 02/29/2020   Procedure: COLONOSCOPY WITH PROPOFOL;  Surgeon: [name], MD;  Location: [facility];  Service: Endoscopy;  Laterality: N/A;   ESOPHAGOGASTRODUODENOSCOPY (EGD) WITH PROPOFOL N/A 02/29/2020   Procedure: ESOPHAGOGASTRODUODENOSCOPY (EGD) WITH PROPOFOL;  Surgeon: [name], MD;  Location: [place];  Service: Endoscopy;  Laterality: N/A;   FOOT SURGERY Right 2015   broken navicular bone   HIP ARTHROSCOPY Right 05/2011   HIP SURGERY     left hip surgery 05/2018   JOINT REPLACEMENT Bilateral 12/2017 & 05/2018   hip   L5-S1 OLIF Dr. Rennis Harding back surgery 09/2019  09/28/2019   left ganglion cyst removal     12/23/19 Dr. Elvina Mattes   MASS EXCISION Left 12/17/2019   Procedure: EXCISION TUMOR FOOT DEEP LEFT;  Surgeon: Albertine Patricia, DPM;  Location: [place];  Service: Podiatry;  Laterality: Left;  LMA W/LOCAL   NASAL TURBINATE REDUCTION Bilateral 06/12/2019   Procedure: TURBINATE REDUCTION/SUBMUCOSAL RESECTION;  Surgeon: [name], MD;  Location: [place];  Service: ENT;  Laterality: Bilateral;   REPLACEMENT TOTAL HIP W/  RESURFACING IMPLANTS Right 12/2017   [facility]   right shoulder surgery     11/28/20 for bone spur, torn labrum, re attached biceps tendon, tear Dr. [name]   ROTATOR CUFF REPAIR     Dr. [name] in 04/2019    SCLEROTHERAPY     veins in lower legs   SEPTOPLASTY Bilateral 06/12/2019   Procedure: SEPTOPLASTY;  Surgeon: [name], MD;  Location: [place];  Service: ENT;  Laterality: Bilateral;   SHOULDER SURGERY Right 12/2015   [facility] torn rotator cuff   SPINAL FUSION  2016   [facility] lumbar    SOCIAL HISTORY: Social History    Socioeconomic History   Marital status: Married    Spouse name: fredrick   Number of children: 1   Years of education: Not on file   Highest education level: High school graduate  Occupational History    Comment: full time  Tobacco Use   Smoking status: Never   Smokeless tobacco: Never  Vaping Use   Vaping Use: Never used  Substance and Sexual Activity   Alcohol use: Yes    Alcohol/week: 1.0 standard drink    Types: 1 Cans of beer per week    Comment: may have drink 1x/month   Drug use: Never   Sexual activity: Yes  Other Topics Concern   Not on file  Social History Narrative   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    Married 1 child    Lives at home with her husband   Right handed   Caffeine: maybe 1 cup/day (pepsi)   Social Determinants of Health   Financial Resource Strain: Not on file  Food Insecurity: Not on file  Transportation Needs: Not on file  Physical Activity: Not on file  Stress: Not on file  Social Connections: Not on file  Intimate Partner Violence: Not on file    FAMILY HISTORY: Family History  Problem Relation Age of Onset   Breast cancer Maternal Aunt    Breast cancer Paternal Aunt    Dementia Paternal Aunt    COPD Mother        quit smoking after 84 years   Arthritis Mother    Osteoarthritis Mother    Dementia Father        symptoms started in his 79s   Alzheimer's disease Father    Rheum arthritis Father    Rheum arthritis Other        FH   Dementia Other        "all my dad's side of the family"   Other Maternal Grandmother        got dementia as she got older    Dementia Paternal Grandmother     ALLERGIES:  is allergic to amoxicillin, septra [sulfamethoxazole-trimethoprim], and cephalexin.  MEDICATIONS:  Current Outpatient Medications  Medication Sig Dispense Refill   baclofen (LIORESAL) 10 MG tablet Take 10 mg by mouth 3 (three) times daily as needed for muscle spasms.     BELBUCA 450 MCG FILM SMARTSIG:1 Strip(s)  By Mouth Every 12 Hours PRN     cetirizine (ZYRTEC) 10 MG tablet Take 10 mg by mouth daily.     dexlansoprazole (DEXILANT) 60 MG capsule Take 1 capsule (60 mg total) by mouth daily. 90 capsule 1   diclofenac Sodium (VOLTAREN) 1 % GEL Apply 2-4 g topically 4 (four) times daily. 2 gram upper body qid prn and 4 gram lower body qid prn 100 g 11   DULoxetine (CYMBALTA)  60 MG capsule TAKE 1 CAPSULE BY MOUTH EVERY DAY 90 capsule 1   estradiol (ESTRACE) 0.1 MG/GM vaginal cream every 14 (fourteen) days.     famotidine (PEPCID) 20 MG tablet TAKE 1 TABLET BY MOUTH EVERYDAY AT BEDTIME 90 tablet 0   fluticasone (FLONASE) 50 MCG/ACT nasal spray Place 1-2 sprays into both nostrils daily as needed. Max dose 2 sprays each nostril 16 g 12   hydrOXYzine (ATARAX/VISTARIL) 25 MG tablet Take 1-2 tablets (25-50 mg total) by mouth daily as needed for anxiety. 60 tablet 1   Melatonin 3 MG CAPS Take by mouth.     methocarbamol (ROBAXIN) 500 MG tablet Take 2 tablets (1,000 mg total) by mouth 2 (two) times daily as needed for muscle spasms. 60 tablet 2   ondansetron (ZOFRAN) 8 MG tablet Take 1 tablet (8 mg total) by mouth every 8 (eight) hours as needed for nausea or vomiting. 40 tablet 2   oxyCODONE-acetaminophen (PERCOCET) 10-325 MG tablet Take 1 tablet by mouth every 6 (six) hours as needed.     pregabalin (LYRICA) 50 MG capsule 200 mg.     RIVAROXABAN (XARELTO) VTE STARTER PACK (15 & 20 MG) Follow package directions: Take one 15mg  tablet by mouth twice a day. On day 22, switch to one 20mg  tablet once a day. Take with food. 51 each 0   traZODone (DESYREL) 100 MG tablet Take 2 tablets (200 mg total) by mouth at bedtime as needed for sleep. 180 tablet 1   valACYclovir (VALTREX) 1000 MG tablet as needed. Oral blister     benzonatate (TESSALON) 200 MG capsule benzonatate 200 mg capsule  TK 1 C PO TID PRF COUGH     doxycycline (VIBRAMYCIN) 100 MG capsule Take 100 mg by mouth 2 (two) times daily.     Elastic Bandages &  Supports (RELIEF KNEE) MISC Apply in the morning and remove at night. (Patient not taking: No sig reported)     fluconazole (DIFLUCAN) 150 MG tablet Take by mouth. (Patient not taking: No sig reported)     linaclotide (LINZESS) 145 MCG CAPS capsule Take 1 capsule (145 mcg total) by mouth daily before breakfast. (Patient not taking: No sig reported) 30 capsule 0   neomycin-polymyxin-dexameth (MAXITROL) 0.1 % OINT neomycin 3.5 mg/g-polymyxin B 10,000 unit/g-dexameth 0.1 % eye oint  APPLY TO AFFECTED AREA TWICE DAILY FOR 10 DAYS     phentermine (ADIPEX-P) 37.5 MG tablet Take 1 tablet (37.5 mg total) by mouth daily before breakfast. rx 1/2 (Patient not taking: No sig reported) 60 tablet 0   rivaroxaban (XARELTO) 20 MG TABS tablet Take 1 tablet (20 mg total) by mouth daily. (Patient not taking: No sig reported) 90 tablet 0   No current facility-administered medications for this visit.     PHYSICAL EXAMINATION: ECOG PERFORMANCE STATUS: 0 - Asymptomatic Vitals:   03/29/21 1408  BP: 120/78  Pulse: 64  Resp: 17  Temp: (!) 96.6 F (35.9 C)  SpO2: 98%   Filed Weights   03/29/21 1408  Weight: 206 lb (93.4 kg)    Physical Exam Constitutional:      General: She is not in acute distress. HENT:     Head: Normocephalic and atraumatic.  Eyes:     General: No scleral icterus.    Pupils: Pupils are equal, round, and reactive to light.  Cardiovascular:     Rate and Rhythm: Normal rate and regular rhythm.     Heart sounds: Normal heart sounds.  Pulmonary:  Effort: Pulmonary effort is normal. No respiratory distress.     Breath sounds: No wheezing.  Abdominal:     General: Bowel sounds are normal. There is no distension.     Palpations: Abdomen is soft. There is no mass.     Tenderness: There is no abdominal tenderness.  Musculoskeletal:        General: No deformity. Normal range of motion.     Cervical back: Normal range of motion and neck supple.  Skin:    General: Skin is warm and  dry.     Findings: No erythema or rash.  Neurological:     Mental Status: She is alert and oriented to person, place, and time.     Cranial Nerves: No cranial nerve deficit.     Coordination: Coordination normal.  Psychiatric:        Behavior: Behavior normal.        Thought Content: Thought content normal.     LABORATORY DATA:  I have reviewed the data as listed Lab Results  Component Value Date   WBC 7.0 03/29/2021   HGB 13.2 03/29/2021   HCT 41.1 03/29/2021   MCV 96.7 03/29/2021   PLT 308 03/29/2021   Recent Labs    06/19/20 1135 09/22/20 1026 12/28/20 1217 03/29/21 1347  NA 136 139 139 140  K 3.7 4.0 3.9 4.0  CL 104 107 105 105  CO2 21* 26 27 30   GLUCOSE 111* 94 98 83  BUN 24* 22 18 21*  CREATININE 0.69 0.85 0.65 0.93  CALCIUM 9.1 9.6 8.9 8.9  GFRNONAA >60  --  >60 >60  PROT 7.3 7.2  --  7.2  ALBUMIN 4.1 4.2  --  4.0  AST 20 15  --  19  ALT 19 28  --  18  ALKPHOS 68 75  --  67  BILITOT 0.5 0.5  --  0.6    Iron/TIBC/Ferritin/ %Sat    Component Value Date/Time   IRON 77 09/25/2019 0950   TIBC 336 09/25/2019 0950   FERRITIN 37 09/25/2019 0950   IRONPCTSAT 23 09/25/2019 0950      ASSESSMENT & PLAN:  1. History of DVT (deep vein thrombosis)   2. Heterozygous factor V Leiden mutation ([place])    provoked events secondary to recent right shoulder procedure and decreased mobility Factor V leiden mutation heterozygous state. Recommend first degree relative to consider screening.   Discussed with patient that Factor V leiden single mutation does not change the decision of anticoagulation duration.  Duration is mainly determined by provoke vs unprovoked event nature. Her VTE was provoked by surgery.  Assuming her repeat US is negative, antiphospholipid panel is negative [ pending results],  I recommend patient to finish 3 months anticoagulation and stop. Recommend Aspirin 81mg  daily afterwards.  In the future, if any immobilization events, ie, surgery, travel,  etc, she needs short term prophylactic anticoagulation.    All questions were answered. The patient knows to call the clinic with any problems questions or concerns.  Cc McLean-Scocuzza, Olivia Mackie *  Return of visit: follow up PRN Thank you for this kind referral and the opportunity to participate in the care of this patient. A copy of today's note is routed to referring provider    Earlie Server, MD, PhD 03/29/2021

## 2021-03-31 LAB — ANTIPHOSPHOLIPID SYNDROME PROF
Anticardiolipin IgG: <9 GPL U/mL (ref 0–14)
Anticardiolipin IgM: 9 MPL U/mL (ref 0–12)
DRVVT: 64.6 s — ABNORMAL HIGH (ref 0.0–47.0)
PTT Lupus Anticoagulant: 40.7 s (ref 0.0–51.9)

## 2021-03-31 LAB — DRVVT CONFIRM: dRVVT Confirm: 1.2 ratio (ref 0.8–1.2)

## 2021-03-31 LAB — DRVVT MIX: dRVVT Mix: 54.2 s — ABNORMAL HIGH (ref 0.0–40.4)

## 2021-04-03 ENCOUNTER — Other Ambulatory Visit: Payer: Self-pay

## 2021-04-03 ENCOUNTER — Ambulatory Visit (INDEPENDENT_AMBULATORY_CARE_PROVIDER_SITE_OTHER): Payer: 59 | Admitting: Family

## 2021-04-03 ENCOUNTER — Encounter: Payer: Self-pay | Admitting: Family

## 2021-04-03 VITALS — BP 110/62 | HR 67 | Temp 98.5°F | Ht 67.0 in | Wt 199.4 lb

## 2021-04-03 DIAGNOSIS — G894 Chronic pain syndrome: Secondary | ICD-10-CM

## 2021-04-03 DIAGNOSIS — R110 Nausea: Secondary | ICD-10-CM | POA: Diagnosis not present

## 2021-04-03 DIAGNOSIS — J329 Chronic sinusitis, unspecified: Secondary | ICD-10-CM | POA: Diagnosis not present

## 2021-04-03 DIAGNOSIS — E669 Obesity, unspecified: Secondary | ICD-10-CM

## 2021-04-03 MED ORDER — ONDANSETRON HCL 8 MG PO TABS: 8.0000 mg | ORAL_TABLET | Freq: Three times a day (TID) | ORAL | 1 refills | Status: DC | PRN

## 2021-04-03 MED ORDER — PHENTERMINE HCL 37.5 MG PO TABS: 37.5000 mg | ORAL_TABLET | Freq: Every day | ORAL | 0 refills | Status: DC

## 2021-04-03 MED ORDER — DOXYCYCLINE HYCLATE 100 MG PO TABS
100.0000 mg | ORAL_TABLET | Freq: Two times a day (BID) | ORAL | 0 refills | Status: DC
Start: 2021-04-03 — End: 2021-07-04

## 2021-04-03 MED ORDER — FLUCONAZOLE 150 MG PO TABS: 150.0000 mg | ORAL_TABLET | Freq: Once | ORAL | 0 refills | Status: AC

## 2021-04-03 NOTE — Patient Instructions (Signed)
Please let Dr Debara Pickett know tomorrow that I have prescribed phentermine.  If you develop palpitations, increased anxiety, trouble sleeping, please stop phentermine immediately.  Start doxycycline for suspected bacterial infection.  Start probiotics as discussed.  Due to recurrent nature of sinus infections, it is very important that you call [facility] to schedule follow-up

## 2021-04-03 NOTE — Assessment & Plan Note (Addendum)
Acute. Duration 10 days. Start doxycline with probiotics.  She had an earlier sinusitis 3 months ago.  She is established with Dr Tami Ribas, [facility],and advised her to call his office and schedule follow-up based on recurrent nature of sinus disease after sinus surgery .

## 2021-04-03 NOTE — Assessment & Plan Note (Addendum)
Uncontrolled. She had taken phentermine from January to March earlier this year.  She reported weight loss on medication.  Today she states anxiety, insomnia are much better controlled since she has started longt-term disability and is no longer working.  No history of arrhythmia.  She is seeing cardiology tomorrow in the setting of hyperlipidemia.  I have advised her to discuss and inform Dr. Debara Pickett that I have started phentermine.  Counseled patient on side effects including palpitations, increased anxiety, insomnia in which she would need to call us.  I provided 1 month supply and she may receive further refills from her PCP.  I looked up patient on [place] Controlled Substances Reporting System PMP AWARE and saw no activity that raised concern of inappropriate use.

## 2021-04-03 NOTE — Progress Notes (Signed)
Subjective:    Patient ID: Maureen Randall, female    DOB: Mar 11, 1966, 55 y.o.   MRN: [id number]  CC: Maureen Randall is a 55 y.o. female who presents today for follow up.   HPI: She complains of sinus pressure x 10 days, unchanged.  Endorses HA between eyes. No  pain in face, or worsening HA when leaning forward.  She had sinus infection 3-4 months and treated with doxycycline 12/07/20 with resolution.  Compliant with zrytec, flonase.   No nasal congestion, cough, wheezing, fever, vision changes, teeth pain.   H/o sinus surgery 06/2019 Dr Tami Ribas  She would like to refill of zofran and phentermine ( l ast prescribed 07/2020, 08/2020 and 09/2020). Opioids make her nauseated, and she using zofran most every day. No abdominal pain, vomiting.   Phentermine was very helpful. No increased anxiety or insomnia. She is not working now and stress decreased. Walking limited by chronic low back pain. Walks 30 minutes daily.  No h/o heart disease, arrhthymias.   H/o prediabetes  She is seeing Dr Debara Pickett tomorrow for HLD.    She follows with Dr Shea Evans for depression, GAD and prescribed Cymbalta 60 mg daily, hydroxyzine as needed.  Trazodone as needed.  She follows with pain management for lyrica, percocet, belbuca Dr Clement Sayres in [place], [place]. She is using less baclofen. H/o back surgery and hip surgery.  She was seen by Dr Marcos Eke 09/2020.She has scheduled an appointment with him.  She request a second opinion with neurosurgery due to ongoing back pain.     History of DVT, factor V Leiden mutation.  She is followed by Dr. Tasia Catchings, last seen 03/29/21. HISTORY:  Past Medical History:  Diagnosis Date  . Anxiety   . Arthritis    osteoarthritis-joints, feet, shoulder  . Asthma    previously diagnosed after a bout of bronchitis/ no problems since  . Bursitis    b/l hips  . Chronic pain syndrome   . COPD (chronic obstructive pulmonary disease) ([place]) 2004   patient states probably bronchitis  .  Depression   . DVT (deep venous thrombosis) ([place]) 2012   right leg. after surgery, while on BCP  . GERD (gastroesophageal reflux disease)   . Hyperlipidemia   . Left leg DVT ([place])    fem/pop 12/07/20 after right shoulder surgery 11/30/20  . Neuropathy    post surgery  . PVD (peripheral vascular disease) ([place])   . Spondylolisthesis    Past Surgical History:  Procedure Laterality Date  . ABLATION     veins in lower legs  . BACK SURGERY  07/2014   lumbar spine/ fusion 1 level  . BREAST BIOPSY Left 2011   neg  . CARPAL TUNNEL RELEASE Right 12/2015   [facility]  . CARPAL TUNNEL RELEASE Left 12/2016  . COLONOSCOPY WITH PROPOFOL N/A 02/29/2020   Procedure: COLONOSCOPY WITH PROPOFOL;  Surgeon: [name], MD;  Location: [facility];  Service: Endoscopy;  Laterality: N/A;  . ESOPHAGOGASTRODUODENOSCOPY (EGD) WITH PROPOFOL N/A 02/29/2020   Procedure: ESOPHAGOGASTRODUODENOSCOPY (EGD) WITH PROPOFOL;  Surgeon: [name], MD;  Location: [facility];  Service: Endoscopy;  Laterality: N/A;  . FOOT SURGERY Right 2015   broken navicular bone  . HIP ARTHROSCOPY Right 05/2011  . HIP SURGERY     left hip surgery 05/2018  . JOINT REPLACEMENT Bilateral 12/2017 & 05/2018   hip  . L5-S1 OLIF Dr. Rennis Harding back surgery 09/2019  09/28/2019  . left ganglion cyst removal  12/23/19 Dr. Elvina Mattes  . MASS EXCISION Left 12/17/2019   Procedure: EXCISION TUMOR FOOT DEEP LEFT;  Surgeon: Albertine Patricia, DPM;  Location: [facility];  Service: Podiatry;  Laterality: Left;  LMA W/LOCAL  . NASAL TURBINATE REDUCTION Bilateral 06/12/2019   Procedure: TURBINATE REDUCTION/SUBMUCOSAL RESECTION;  Surgeon: [name], MD;  Location: [facility];  Service: ENT;  Laterality: Bilateral;  . REPLACEMENT TOTAL HIP W/  RESURFACING IMPLANTS Right 12/2017   [facility]  . right shoulder surgery     11/28/20 for bone spur, torn labrum, re attached biceps tendon, tear Dr. [name]  . ROTATOR  CUFF REPAIR     Dr. [name] in 04/2019   . SCLEROTHERAPY     veins in lower legs  . SEPTOPLASTY Bilateral 06/12/2019   Procedure: SEPTOPLASTY;  Surgeon: [name], MD;  Location: [facility];  Service: ENT;  Laterality: Bilateral;  . SHOULDER SURGERY Right 12/2015   [facility] torn rotator cuff  . SPINAL FUSION  2016   [facility] lumbar   Family History  Problem Relation Age of Onset  . Breast cancer Maternal Aunt   . Breast cancer Paternal Aunt   . Dementia Paternal Aunt   . COPD Mother        quit smoking after 60 years  . Arthritis Mother   . Osteoarthritis Mother   . Dementia Father        symptoms started in his 24s  . Alzheimer's disease Father   . Rheum arthritis Father   . Rheum arthritis Other        FH  . Dementia Other        "all my dad's side of the family"  . Other Maternal Grandmother        got dementia as she got older   . Dementia Paternal Grandmother     Allergies: Amoxicillin, Septra [sulfamethoxazole-trimethoprim], and Cephalexin Current Outpatient Medications on File Prior to Visit  Medication Sig Dispense Refill  . baclofen (LIORESAL) 10 MG tablet Take 10 mg by mouth 3 (three) times daily as needed for muscle spasms.    . BELBUCA 450 MCG FILM SMARTSIG:1 Strip(s) By Mouth Every 12 Hours PRN    . cetirizine (ZYRTEC) 10 MG tablet Take 10 mg by mouth daily.    [name] dexlansoprazole (DEXILANT) 60 MG capsule Take 1 capsule (60 mg total) by mouth daily. 90 capsule 1  . diclofenac Sodium (VOLTAREN) 1 % GEL Apply 2-4 g topically 4 (four) times daily. 2 gram upper body qid prn and 4 gram lower body qid prn 100 g 11  . DULoxetine (CYMBALTA) 60 MG capsule TAKE 1 CAPSULE BY MOUTH EVERY DAY 90 capsule 1  . Elastic Bandages & Supports (RELIEF KNEE) MISC Apply in the morning and remove at night.    . estradiol (ESTRACE) 0.1 MG/GM vaginal cream every 14 (fourteen) days.    . famotidine (PEPCID) 20 MG tablet TAKE 1 TABLET BY MOUTH EVERYDAY AT BEDTIME 90  tablet 0  . fluticasone (FLONASE) 50 MCG/ACT nasal spray Place 1-2 sprays into both nostrils daily as needed. Max dose 2 sprays each nostril 16 g 12  . hydrOXYzine (ATARAX/VISTARIL) 25 MG tablet Take 1-2 tablets (25-50 mg total) by mouth daily as needed for anxiety. 60 tablet 1  . linaclotide (LINZESS) 145 MCG CAPS capsule Take 1 capsule (145 mcg total) by mouth daily before breakfast. 30 capsule 0  . Melatonin 3 MG CAPS Take by mouth.    . methocarbamol (ROBAXIN) 500  MG tablet Take 2 tablets (1,000 mg total) by mouth 2 (two) times daily as needed for muscle spasms. 60 tablet 2  . oxyCODONE-acetaminophen (PERCOCET) 10-325 MG tablet Take 1 tablet by mouth every 6 (six) hours as needed.    . pregabalin (LYRICA) 50 MG capsule 200 mg.    . rivaroxaban (XARELTO) 20 MG TABS tablet Take 1 tablet by mouth daily.    [name] RIVAROXABAN (XARELTO) VTE STARTER PACK (15 & 20 MG) Follow package directions: Take one 15mg  tablet by mouth twice a day. On day 22, switch to one 20mg  tablet once a day. Take with food. 51 each 0  . traZODone (DESYREL) 100 MG tablet Take 2 tablets (200 mg total) by mouth at bedtime as needed for sleep. 180 tablet 1  . valACYclovir (VALTREX) 1000 MG tablet as needed. Oral blister     No current facility-administered medications on file prior to visit.    Social History   Tobacco Use  . Smoking status: Never  . Smokeless tobacco: Never  Vaping Use  . Vaping Use: Never used  Substance Use Topics  . Alcohol use: Yes    Alcohol/week: 1.0 standard drink    Types: 1 Cans of beer per week    Comment: may have drink 1x/month  . Drug use: Never    Review of Systems  Constitutional:  Negative for chills and fever.  HENT:  Positive for sinus pain. Negative for congestion and hearing loss.   Eyes:  Negative for visual disturbance.  Respiratory:  Negative for cough and shortness of breath.   Cardiovascular:  Negative for chest pain and palpitations.  Gastrointestinal:  Negative for  nausea and vomiting.  Neurological:  Positive for headaches. Negative for dizziness.  Psychiatric/Behavioral:  Negative for sleep disturbance. The patient is not nervous/anxious.      Objective:    BP 110/62 (BP Location: Right Arm, Patient Position: Sitting, Cuff Size: Large)   Pulse 67   Temp 98.5 F (36.9 C) (Oral)   Ht 5\' 7"  (1.702 m)   Wt 199 lb 6.4 oz (90.4 kg)   SpO2 97%   BMI 31.23 kg/m  BP Readings from Last 3 Encounters:  04/03/21 110/62  03/29/21 120/78  12/28/20 107/75   Wt Readings from Last 3 Encounters:  04/03/21 199 lb 6.4 oz (90.4 kg)  03/29/21 206 lb (93.4 kg)  12/28/20 205 lb 8 oz (93.2 kg)    Physical Exam Vitals reviewed.  Constitutional:      Appearance: She is well-developed.  HENT:     Head: Normocephalic and atraumatic.     Right Ear: Hearing, tympanic membrane, ear canal and external ear normal. No decreased hearing noted. No drainage, swelling or tenderness. No middle ear effusion. No foreign body. Tympanic membrane is not erythematous or bulging.     Left Ear: Hearing, tympanic membrane, ear canal and external ear normal. No decreased hearing noted. No drainage, swelling or tenderness.  No middle ear effusion. No foreign body. Tympanic membrane is not erythematous or bulging.     Nose: No rhinorrhea.     Right Sinus: Maxillary sinus tenderness present. No frontal sinus tenderness.     Left Sinus: Maxillary sinus tenderness present. No frontal sinus tenderness.     Mouth/Throat:     Pharynx: Uvula midline. No oropharyngeal exudate or posterior oropharyngeal erythema.     Tonsils: No tonsillar abscesses.  Eyes:     Conjunctiva/sclera: Conjunctivae normal.  Cardiovascular:     Rate and Rhythm: Regular rhythm.  Pulses: Normal pulses.     Heart sounds: Normal heart sounds.  Pulmonary:     Effort: Pulmonary effort is normal.     Breath sounds: Normal breath sounds. No wheezing, rhonchi or rales.  Lymphadenopathy:     Head:     Right side of  head: No submental, submandibular, tonsillar, preauricular, posterior auricular or occipital adenopathy.     Left side of head: No submental, submandibular, tonsillar, preauricular, posterior auricular or occipital adenopathy.     Cervical: No cervical adenopathy.  Skin:    General: Skin is warm and dry.  Neurological:     Mental Status: She is alert.  Psychiatric:        Speech: Speech normal.        Behavior: Behavior normal.        Thought Content: Thought content normal.       Assessment & Plan:   Problem List Items Addressed This Visit       Respiratory   Sinusitis    Acute. Duration 10 days. Start doxycline with probiotics.  She had an earlier sinusitis 3 months ago.  She is established with Dr Tami Ribas, [facility],and advised her to call his office and schedule follow-up based on recurrent nature of sinus disease after sinus surgery .      Relevant Medications   doxycycline (VIBRA-TABS) 100 MG tablet   fluconazole (DIFLUCAN) 150 MG tablet     Other   Chronic pain syndrome - Primary   Relevant Orders   Ambulatory referral to Neurosurgery   Obesity (BMI 30-39.9)    Uncontrolled. She had taken phentermine from January to March earlier this year.  She reported weight loss on medication.  Today she states anxiety, insomnia are much better controlled since she has started longt-term disability and is no longer working.  No history of arrhythmia.  She is seeing cardiology tomorrow in the setting of hyperlipidemia.  I have advised her to discuss and inform Dr. Debara Pickett that I have started phentermine.  Counseled patient on side effects including palpitations, increased anxiety, insomnia in which she would need to call us.  I provided 1 month supply and she may receive further refills from her PCP.  I looked up patient on [place] Controlled Substances Reporting System PMP AWARE and saw no activity that raised concern of inappropriate use.        Relevant Medications   phentermine  (ADIPEX-P) 37.5 MG tablet   Other Visit Diagnoses     Chronic nausea            I have discontinued Claiborne Billings A. Levit's ondansetron, doxycycline, fluconazole, benzonatate, and neomycin-polymyxin-dexameth. I have also changed her phentermine. Additionally, I am having her start on doxycycline and fluconazole. Lastly, I am having her maintain her estradiol, Relief Knee, valACYclovir, baclofen, oxyCODONE-acetaminophen, methocarbamol, Melatonin, linaclotide, diclofenac Sodium, pregabalin, hydrOXYzine, traZODone, DULoxetine, Rivaroxaban Stater Pack (15 mg and 20 mg), cetirizine, famotidine, fluticasone, Belbuca, dexlansoprazole, and rivaroxaban.   Meds ordered this encounter  Medications  . doxycycline (VIBRA-TABS) 100 MG tablet    Sig: Take 1 tablet (100 mg total) by mouth 2 (two) times daily.    Dispense:  10 tablet    Refill:  0    Order Specific Question:   Supervising Provider    Answer:   Deborra Medina L [[id number]]  . phentermine (ADIPEX-P) 37.5 MG tablet    Sig: Take 1 tablet (37.5 mg total) by mouth daily before breakfast.    Dispense:  60 tablet  Refill:  0    Order Specific Question:   Supervising Provider    Answer:   Deborra Medina L [[id number]]  . DISCONTD: ondansetron (ZOFRAN) 8 MG tablet    Sig: Take 1 tablet (8 mg total) by mouth every 8 (eight) hours as needed for nausea or vomiting.    Dispense:  40 tablet    Refill:  1    Order Specific Question:   Supervising Provider    Answer:   Deborra Medina L [[id number]]  . fluconazole (DIFLUCAN) 150 MG tablet    Sig: Take 1 tablet (150 mg total) by mouth once for 1 dose. Take one tablet PO once. If sxs persist, may take one tablet PO 3 days later.    Dispense:  2 tablet    Refill:  0    Order Specific Question:   Supervising Provider    Answer:   Crecencio Mc [[id number]]     Return precautions given.   Risks, benefits, and alternatives of the medications and treatment plan prescribed today were discussed, and patient expressed  understanding.   Education regarding symptom management and diagnosis given to patient on AVS.  Continue to follow with McLean-Scocuzza, Nino Glow, MD for routine health maintenance.   Maureen Randall and I agreed with plan.   Mable Paris, FNP

## 2021-04-04 ENCOUNTER — Encounter (HOSPITAL_BASED_OUTPATIENT_CLINIC_OR_DEPARTMENT_OTHER): Payer: Self-pay | Admitting: Internal Medicine

## 2021-04-04 ENCOUNTER — Telehealth: Payer: Self-pay | Admitting: Internal Medicine

## 2021-04-04 ENCOUNTER — Ambulatory Visit (INDEPENDENT_AMBULATORY_CARE_PROVIDER_SITE_OTHER): Payer: 59 | Admitting: Internal Medicine

## 2021-04-04 VITALS — BP 114/70 | HR 78 | Ht 67.0 in | Wt 207.4 lb

## 2021-04-04 DIAGNOSIS — E785 Hyperlipidemia, unspecified: Secondary | ICD-10-CM | POA: Diagnosis not present

## 2021-04-04 DIAGNOSIS — Z789 Other specified health status: Secondary | ICD-10-CM

## 2021-04-04 NOTE — Telephone Encounter (Signed)
Rejection Reason - Unapproved Service - Dr Lacinda Axon reviewed. She has already seen Dr Patrice Paradise in [place] and Dr Tamala Julian and Dr Marcos Eke with [facility]. Therefore, he is declining this referral. He sent her PCP a message about this." Peggyann Shoals said on Apr 04, 2021 11:08 AM  Msg from [facility] neurosurgery

## 2021-04-04 NOTE — Progress Notes (Signed)
LIPID CLINIC CONSULT NOTE  Chief Complaint:  Manage dyslipidemia  Primary Care Physician: McLean-Scocuzza, Nino Glow, MD  Primary Cardiologist:  None  HPI:  Maureen Randall is a 55 y.o. female who is being seen today for the evaluation of dyslipidemia at the request of McLean-Scocuzza, Olivia Mackie *.  This is a pleasant 55 year old female who was kindly referred for evaluation and management of dyslipidemia.  She has few cardiovascular risk factors, especially that she is not diabetic and denies any hypertension or family history of early onset heart disease.  Most recent lipids showed total cholesterol 226, triglycerides 113, HDL 68 and LDL 135.  Her PCP had felt that her targets for cholesterol may need to be lower, particularly total cholesterol below 200 or LDL below 100.  I suspect her cholesterol is partially higher because of a high HDL cholesterol which may be cardioprotective for her.  Although she has a number of other medical problems including a history of recurrent DVT on Xarelto, she does not seem to have a lot of cardiovascular risk.  She does report rather a less than ideal diet.  She says she eats a lot of cheese burgers, cheese and other saturated fat sources which could be contributing to her higher cholesterol.  Her weight is also over ideal for her height.  PMHx:  Past Medical History:  Diagnosis Date   Anxiety    Arthritis    osteoarthritis-joints, feet, shoulder   Asthma    previously diagnosed after a bout of bronchitis/ no problems since   Bursitis    b/l hips   Chronic pain syndrome    COPD (chronic obstructive pulmonary disease) ([place]) 2004   patient states probably bronchitis   Depression    DVT (deep venous thrombosis) ([place]) 2012   right leg. after surgery, while on BCP   GERD (gastroesophageal reflux disease)    Hyperlipidemia    Left leg DVT ([place])    fem/pop 12/07/20 after right shoulder surgery 11/30/20   Neuropathy    post surgery   PVD (peripheral  vascular disease) ([place])    Spondylolisthesis     Past Surgical History:  Procedure Laterality Date   ABLATION     veins in lower legs   BACK SURGERY  07/2014   lumbar spine/ fusion 1 level   BREAST BIOPSY Left 2011   neg   CARPAL TUNNEL RELEASE Right 12/2015   [facility]   CARPAL TUNNEL RELEASE Left 12/2016   COLONOSCOPY WITH PROPOFOL N/A 02/29/2020   Procedure: COLONOSCOPY WITH PROPOFOL;  Surgeon: [name], MD;  Location: [facility];  Service: Endoscopy;  Laterality: N/A;   ESOPHAGOGASTRODUODENOSCOPY (EGD) WITH PROPOFOL N/A 02/29/2020   Procedure: ESOPHAGOGASTRODUODENOSCOPY (EGD) WITH PROPOFOL;  Surgeon: [name], MD;  Location: [facility];  Service: Endoscopy;  Laterality: N/A;   FOOT SURGERY Right 2015   broken navicular bone   HIP ARTHROSCOPY Right 05/2011   HIP SURGERY     left hip surgery 05/2018   JOINT REPLACEMENT Bilateral 12/2017 & 05/2018   hip   L5-S1 OLIF Dr. Rennis Harding back surgery 09/2019  09/28/2019   left ganglion cyst removal     12/23/19 Dr. Elvina Mattes   MASS EXCISION Left 12/17/2019   Procedure: EXCISION TUMOR FOOT DEEP LEFT;  Surgeon: Albertine Patricia, DPM;  Location: [facility];  Service: Podiatry;  Laterality: Left;  LMA W/LOCAL   NASAL TURBINATE REDUCTION Bilateral 06/12/2019   Procedure: TURBINATE REDUCTION/SUBMUCOSAL RESECTION;  Surgeon: [name], MD;  Location: [facility];  Service: ENT;  Laterality: Bilateral;   REPLACEMENT TOTAL HIP W/  RESURFACING IMPLANTS Right 12/2017   [facility]   right shoulder surgery     11/28/20 for bone spur, torn labrum, re attached biceps tendon, tear Dr. [name]   ROTATOR CUFF REPAIR     Dr. [name] in 04/2019    SCLEROTHERAPY     veins in lower legs   SEPTOPLASTY Bilateral 06/12/2019   Procedure: SEPTOPLASTY;  Surgeon: [name], MD;  Location: [facility];  Service: ENT;  Laterality: Bilateral;   SHOULDER SURGERY Right 12/2015   [facility] torn rotator  cuff   SPINAL FUSION  2016   [facility] lumbar    FAMHx:  Family History  Problem Relation Age of Onset   Breast cancer Maternal Aunt    Breast cancer Paternal Aunt    Dementia Paternal Aunt    COPD Mother        quit smoking after 89 years   Arthritis Mother    Osteoarthritis Mother    Dementia Father        symptoms started in his 61s   Alzheimer's disease Father    Rheum arthritis Father    Rheum arthritis Other        FH   Dementia Other        "all my dad's side of the family"   Other Maternal Grandmother        got dementia as she got older    Dementia Paternal Grandmother     SOCHx:   reports that she has never smoked. She has never used smokeless tobacco. She reports current alcohol use of about 1.0 standard drink per week. She reports that she does not use drugs.  ALLERGIES:  Allergies  Allergen Reactions   Amoxicillin Hives   Septra [Sulfamethoxazole-Trimethoprim] Hives        Cephalexin Rash         ROS: Pertinent items noted in HPI and remainder of comprehensive ROS otherwise negative.  HOME MEDS: Current Outpatient Medications on File Prior to Visit  Medication Sig Dispense Refill   baclofen (LIORESAL) 10 MG tablet Take 10 mg by mouth 3 (three) times daily as needed for muscle spasms.     BELBUCA 450 MCG FILM SMARTSIG:1 Strip(s) By Mouth Every 12 Hours PRN     cetirizine (ZYRTEC) 10 MG tablet Take 10 mg by mouth daily.     dexlansoprazole (DEXILANT) 60 MG capsule Take 1 capsule (60 mg total) by mouth daily. 90 capsule 1   diclofenac Sodium (VOLTAREN) 1 % GEL Apply 2-4 g topically 4 (four) times daily. 2 gram upper body qid prn and 4 gram lower body qid prn 100 g 11   doxycycline (VIBRA-TABS) 100 MG tablet Take 1 tablet (100 mg total) by mouth 2 (two) times daily. 10 tablet 0   DULoxetine (CYMBALTA) 60 MG capsule TAKE 1 CAPSULE BY MOUTH EVERY DAY 90 capsule 1   Elastic Bandages & Supports (RELIEF KNEE) MISC Apply in the morning and remove at night.      estradiol (ESTRACE) 0.1 MG/GM vaginal cream every 14 (fourteen) days.     famotidine (PEPCID) 20 MG tablet TAKE 1 TABLET BY MOUTH EVERYDAY AT BEDTIME 90 tablet 0   fluticasone (FLONASE) 50 MCG/ACT nasal spray Place 1-2 sprays into both nostrils daily as needed. Max dose 2 sprays each nostril 16 g 12   hydrOXYzine (ATARAX/VISTARIL) 25 MG tablet Take 1-2 tablets (25-50 mg total)  by mouth daily as needed for anxiety. 60 tablet 1   linaclotide (LINZESS) 145 MCG CAPS capsule Take 1 capsule (145 mcg total) by mouth daily before breakfast. 30 capsule 0   Melatonin 3 MG CAPS Take by mouth.     methocarbamol (ROBAXIN) 500 MG tablet Take 2 tablets (1,000 mg total) by mouth 2 (two) times daily as needed for muscle spasms. 60 tablet 2   ondansetron (ZOFRAN) 8 MG tablet Take 1 tablet (8 mg total) by mouth every 8 (eight) hours as needed for nausea or vomiting. 40 tablet 1   oxyCODONE-acetaminophen (PERCOCET) 10-325 MG tablet Take 1 tablet by mouth every 6 (six) hours as needed.     phentermine (ADIPEX-P) 37.5 MG tablet Take 1 tablet (37.5 mg total) by mouth daily before breakfast. 60 tablet 0   pregabalin (LYRICA) 50 MG capsule 200 mg.     rivaroxaban (XARELTO) 20 MG TABS tablet Take 1 tablet by mouth daily.     traZODone (DESYREL) 100 MG tablet Take 2 tablets (200 mg total) by mouth at bedtime as needed for sleep. 180 tablet 1   valACYclovir (VALTREX) 1000 MG tablet as needed. Oral blister     No current facility-administered medications on file prior to visit.    LABS/IMAGING: No results found for this or any previous visit (from the past 48 hour(s)). No results found.  LIPID PANEL:    Component Value Date/Time   CHOL 226 (H) 01/23/2021 1007   TRIG 113.0 01/23/2021 1007   HDL 68.10 01/23/2021 1007   CHOLHDL 3 01/23/2021 1007   VLDL 22.6 01/23/2021 1007   LDLCALC 135 (H) 01/23/2021 1007    WEIGHTS: Wt Readings from Last 3 Encounters:  04/04/21 207 lb 6.4 oz (94.1 kg)  04/03/21 199 lb 6.4  oz (90.4 kg)  03/29/21 206 lb (93.4 kg)    VITALS: BP 114/70   Pulse 78   Ht 5\' 7"  (1.702 m)   Wt 207 lb 6.4 oz (94.1 kg)   SpO2 98%   BMI 32.48 kg/m   EXAM: General appearance: alert, no distress, and mildly obese Neck: no carotid bruit, no JVD, and thyroid not enlarged, symmetric, no tenderness/mass/nodules Lungs: clear to auscultation bilaterally Heart: regular rate and rhythm, S1, S2 normal, no murmur, click, rub or gallop Abdomen: soft, non-tender; bowel sounds normal; no masses,  no organomegaly Extremities: extremities normal, atraumatic, no cyanosis or edema Pulses: 2+ and symmetric Skin: Skin color, texture, turgor normal. No rashes or lesions Neurologic: Grossly normal Psych: Pleasant  *Examination chaperoned by Sheral Apley, RN  EKG: Deferred  ASSESSMENT: Mixed dyslipidemia Atherogenic diet  PLAN: 1.   Ms. Pech has a mixed dyslipidemia with primarily an atherogenic diet.  We provided her with dietary recommendations today and I think that primarily we should look at dietary modification and weight loss as a way to treat her cholesterol.  She does have a high HDL cholesterol which could be cardioprotective.  I recommended a calcium score to help further risk stratify her.  If her calcium score is 0 or quite low, then we may just focus on dietary modification.  If her calcium score is elevated, then I would recommend likely statin therapy in addition to dietary modifications to reach her targets.  Thanks again for the kind referral.  Plan follow-up with me and repeat lipids in a few months.  Pixie Casino, MD, [facility], [place] Director of the [facility] Lipid Disorders &  Cardiovascular  Risk Reduction Clinic Diplomate of the American Board of Clinical Lipidology Attending Cardiologist  Direct Dial: [phone]  Fax: [phone]  Website:  [email or web address] 04/04/2021, 4:33 PM

## 2021-04-04 NOTE — Patient Instructions (Signed)
Medication Instructions:  Your physician recommends that you continue on your current medications as directed. Please refer to the Current Medication list given to you today.  *If you need a refill on your cardiac medications before your next appointment, please call your pharmacy*   Lab Work: FASTING lipid panel before your next appointment with Dr. Debara Pickett  If you have labs (blood work) drawn today and your tests are completely normal, you will receive your results only by: [facility] (if you have MyChart) OR A paper copy in the mail If you have any lab test that is abnormal or we need to change your treatment, we will call you to review the results.   Testing/Procedures: Dr. Debara Pickett has ordered a CT coronary calcium score. This test is done at [facility]. This is $99 out of pocket.   Coronary CalciumScan A coronary calcium scan is an imaging test used to look for deposits of calcium and other fatty materials (plaques) in the inner lining of the blood vessels of the heart (coronary arteries). These deposits of calcium and plaques can partly clog and narrow the coronary arteries without producing any symptoms or warning signs. This puts a person at risk for a heart attack. This test can detect these deposits before symptoms develop. Tell a health care provider about: Any allergies you have. All medicines you are taking, including vitamins, herbs, eye drops, creams, and over-the-counter medicines. Any problems you or family members have had with anesthetic medicines. Any blood disorders you have. Any surgeries you have had. Any medical conditions you have. Whether you are pregnant or may be pregnant. What are the risks? Generally, this is a safe procedure. However, problems may occur, including: Harm to a pregnant woman and her unborn baby. This test involves the use of radiation. Radiation exposure can be dangerous to a pregnant woman and her unborn baby. If you are pregnant,  you generally should not have this procedure done. Slight increase in the risk of cancer. This is because of the radiation involved in the test. What happens before the procedure? No preparation is needed for this procedure. What happens during the procedure? You will undress and remove any jewelry around your neck or chest. You will put on a hospital gown. Sticky electrodes will be placed on your chest. The electrodes will be connected to an electrocardiogram (ECG) machine to record a tracing of the electrical activity of your heart. A CT scanner will take pictures of your heart. During this time, you will be asked to lie still and hold your breath for 2-3 seconds while a picture of your heart is being taken. The procedure may vary among health care providers and hospitals. What happens after the procedure? You can get dressed. You can return to your normal activities. It is up to you to get the results of your test. Ask your health care provider, or the department that is doing the test, when your results will be ready. Summary A coronary calcium scan is an imaging test used to look for deposits of calcium and other fatty materials (plaques) in the inner lining of the blood vessels of the heart (coronary arteries). Generally, this is a safe procedure. Tell your health care provider if you are pregnant or may be pregnant. No preparation is needed for this procedure. A CT scanner will take pictures of your heart. You can return to your normal activities after the scan is done. This information is not intended to replace advice given to you  by your health care provider. Make sure you discuss any questions you have with your health care provider. Document Released: 12/22/2007 Document Revised: 05/14/2016 Document Reviewed: 05/14/2016 Elsevier Interactive Patient Education  2017 [place]: At [facility], you and your health needs are our priority.  As part of our  continuing mission to provide you with exceptional heart care, we have created designated Provider Care Teams.  These Care Teams include your primary Cardiologist (physician) and Advanced Practice Providers (APPs -  Physician Assistants and Nurse Practitioners) who all work together to provide you with the care you need, when you need it.  We recommend signing up for the patient portal called "MyChart".  Sign up information is provided on this After Visit Summary.  MyChart is used to connect with patients for Virtual Visits (Telemedicine).  Patients are able to view lab/test results, encounter notes, upcoming appointments, etc.  Non-urgent messages can be sent to your provider as well.   To learn more about what you can do with MyChart, go to [email or web address].    Your next appointment:   4 month(s) - lipid clinic  The format for your next appointment:   In Person  Provider:   K. Mali Hilty, MD   Other Instructions

## 2021-04-06 ENCOUNTER — Telehealth: Payer: Self-pay | Admitting: Family

## 2021-04-06 NOTE — Telephone Encounter (Signed)
Call pt  Dr Lacinda Axon from neurosurgery reviewed my referral and advised that instead of seeing another provider in his practice, he advised patient to speak with Dr Tamala Julian who handles spine care regarding whom he would advise for second opinion.   Or she may want an opinion outside of [facility] practice. If that is case, please ask pt where she would like to go.

## 2021-04-06 NOTE — Telephone Encounter (Signed)
Patient would like to be referred to [facility] in [place]. She would prefer an opinion outside of the [facility]. She said that she Dr. Tamala Julian for a ganglion cyst of her foot & did want to see him for her spine as well.

## 2021-04-06 NOTE — Telephone Encounter (Signed)
-----   Message from Deetta Perla, MD sent at 04/04/2021  8:27 AM EDT ----- Ms Maureen Randall,  I received a referral for this patient for evaluation of her spine.  She has already been evaluated for spine from one of our partners, Dr. Marcos Eke but she also sees another one of our partners, Dr. Tamala Julian who also handles spine care.  She just saw him last month after her prior nerve surgery.  My recommendation is she discuss with Dr. Tamala Julian if she would like a second opinion for her spine.  I do not feel an opinion from a third person in our practice is the best option.  If she would like an opinion outside of the [facility] practice, that may be another option.  Thanks, Cathleen Fears

## 2021-04-07 ENCOUNTER — Other Ambulatory Visit: Payer: Self-pay | Admitting: Family

## 2021-04-07 DIAGNOSIS — M545 Low back pain, unspecified: Secondary | ICD-10-CM

## 2021-04-07 DIAGNOSIS — G8929 Other chronic pain: Secondary | ICD-10-CM

## 2021-04-07 NOTE — Telephone Encounter (Signed)
LM that referral was placed & asked her to call back if she does not hear within the next 7-10 das in regards to an appointment.

## 2021-04-07 NOTE — Telephone Encounter (Signed)
Call pt Referral placed Let us know if you dont hear back within a week in regards to an appointment being scheduled.

## 2021-04-24 DIAGNOSIS — R030 Elevated blood-pressure reading, without diagnosis of hypertension: Secondary | ICD-10-CM | POA: Insufficient documentation

## 2021-04-24 HISTORY — DX: Elevated blood-pressure reading, without diagnosis of hypertension: R03.0

## 2021-04-25 ENCOUNTER — Telehealth: Payer: Self-pay | Admitting: *Deleted

## 2021-04-25 NOTE — Telephone Encounter (Signed)
   Name: Maureen Randall  DOB: Aug 17, 1965  MRN: [id number]   Primary Cardiologist: Pixie Casino, MD  Chart reviewed as part of pre-operative protocol coverage. Patient was contacted 04/25/2021 in reference to pre-operative risk assessment for pending surgery as outlined below.  Maureen Randall was last seen on 04/04/2021 by Dr. Debara Pickett.  Since that day, Maureen Randall has done well without chest pain or worsening dyspnea.  Therefore, based on ACC/AHA guidelines, the patient would be at acceptable risk for the planned procedure without further cardiovascular testing.   Patient is only seeing cardiology service for management of hyperlipidemia.   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 Xarelto is not managed by cardiology service, instead, it is managed by [facility] surgery Tacey Ruiz FNP) whom she saw today and had a repeat venous doppler or PCP. Patient was also seen by Dr. Earlie Server of Hematology and Oncology service on 03/29/2021 who felt that: "Assuming her repeat US is negative, antiphospholipid panel is negative [ pending results], I recommend patient to finish 3 months anticoagulation and stop. 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 One is patent and fully compressible. The other is non-compressible with less than 50% patentcy.There is deep venous reflux lasting 3.50 seconds in one of the left popliteal veins.  Ultimately, I have instructed the patient to contact vascular surgery to figure out whether it is ok to hold Xarelto for myelogram and how long to hold it.   The patient  was advised that if she develops new symptoms prior to surgery to contact our office to arrange for a follow-up visit, and she verbalized understanding.  I will route this recommendation to the requesting party via Epic fax function and remove from pre-op pool. Please call with questions.  [place], [place] 04/25/2021, 1:34 PM

## 2021-04-25 NOTE — Telephone Encounter (Signed)
   Pre-operative Risk Assessment    Patient Name: Maureen Randall  DOB: 1965-10-07 MRN: [id number]      Request for Surgical Clearance   Procedure:   LUMBAR MYELOGRAM/CT  Date of Surgery: Clearance TBD                                 Surgeon:  DR. KYLE CABBELL Surgeon's Group or Practice Name:  [facility] Phone number:  [phone] Fax number:  [phone] ATTN: JESSICA   Type of Clearance Requested: - Medical  - Pharmacy:  Hold Rivaroxaban (Xarelto)     Type of Anesthesia:   VALIUM 10 MG   Additional requests/questions:   Jiles Prows   04/25/2021, 12:31 PM

## 2021-04-27 ENCOUNTER — Other Ambulatory Visit: Payer: Self-pay

## 2021-04-27 ENCOUNTER — Ambulatory Visit
Admission: RE | Admit: 2021-04-27 | Discharge: 2021-04-27 | Disposition: A | Discharge status: Home / Self Care | Payer: 59 | Source: Ambulatory Visit | Attending: Internal Medicine | Admitting: Internal Medicine

## 2021-04-27 DIAGNOSIS — E785 Hyperlipidemia, unspecified: Secondary | ICD-10-CM | POA: Insufficient documentation

## 2021-04-27 IMAGING — CT CT CARDIAC CORONARY ARTERY CALCIUM SCORE
2 series · 15 of 20 positions shown, 18 images · non-contrast
Comparison: None.
COMPARISON: None.

Addendum:
EXAM:
OVER-READ INTERPRETATION  CT CHEST

The following report is an over-read performed by radiologist Dr.
over-read does not include interpretation of cardiac or coronary
anatomy or pathology. The calcium score interpretation by the
cardiologist is attached.
CLINICAL DATA: Risk stratification
Coronary Calcium Score
TECHNIQUE: The patient was scanned on a Siemens Somatom go.Top Scanner. Axial
non-contrast 3 mm slices were carried out through the heart. The
data set was analyzed on a dedicated work station and scored using
the Agatson method.

[Series 3: casc 3.0 i36f 2 bestdiast 71 % · axial · 0.37mm/px · z∈[+1334,+1450]mm · 10 of 72 slices shown, 13 images]
[im 7/72  vessel]
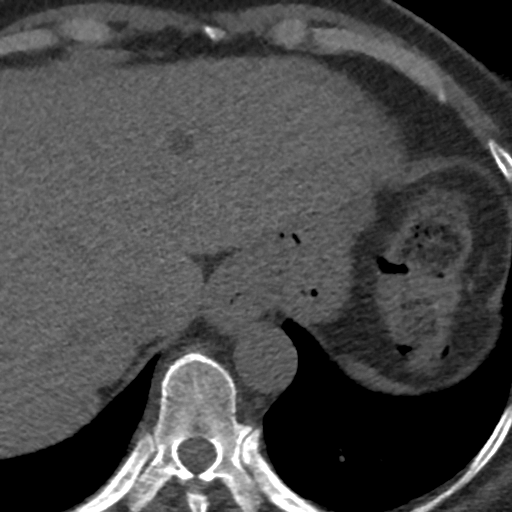
[im 7/72  lung]
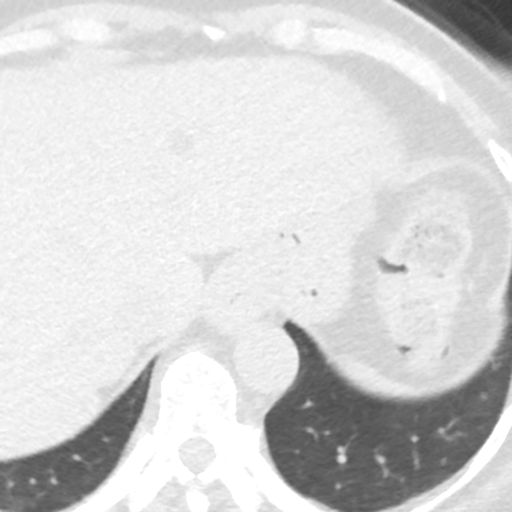
[im 13/72  vessel]
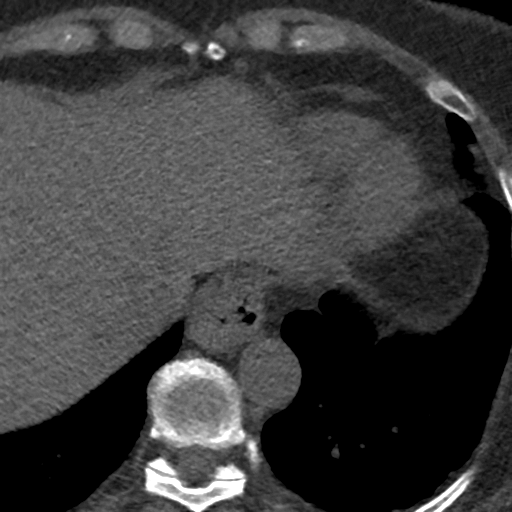
[im 20/72  vessel]
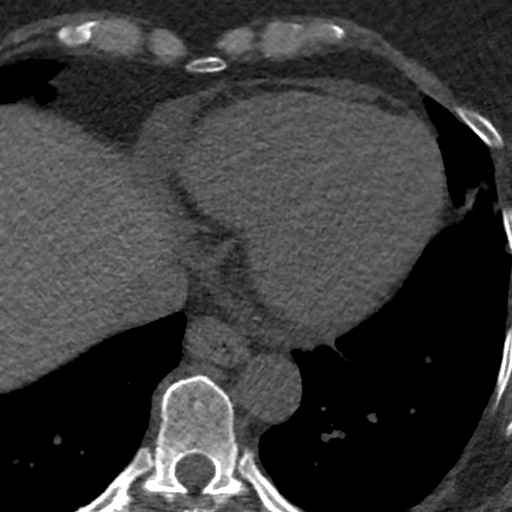
[im 26/72  vessel]
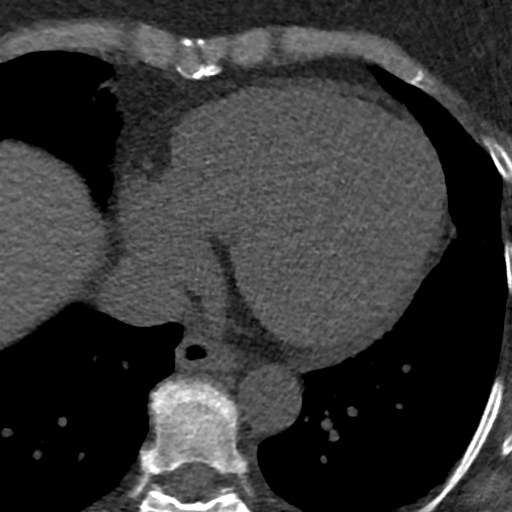
[im 33/72  vessel]
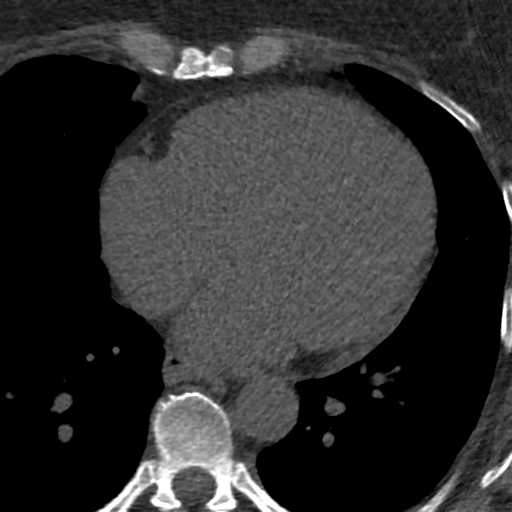
[im 33/72  lung]
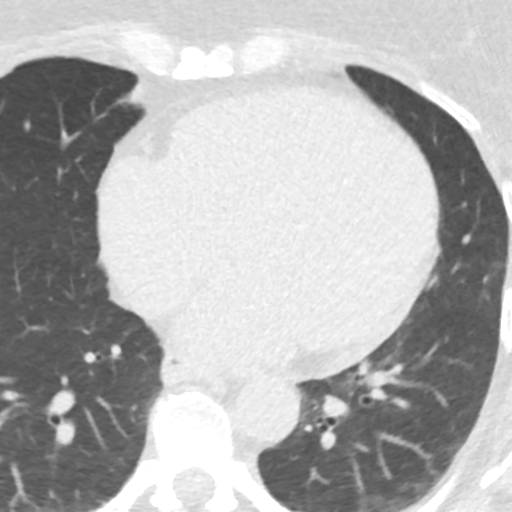
[im 39/72  vessel]
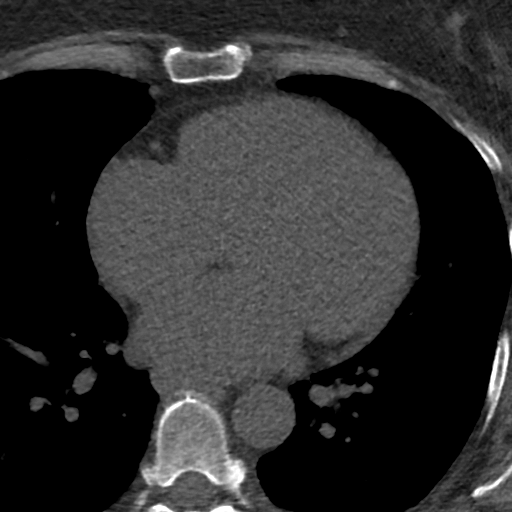
[im 46/72  vessel]
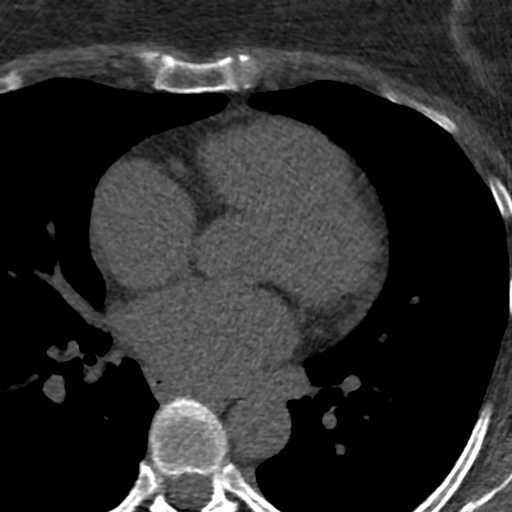
[im 52/72  vessel]
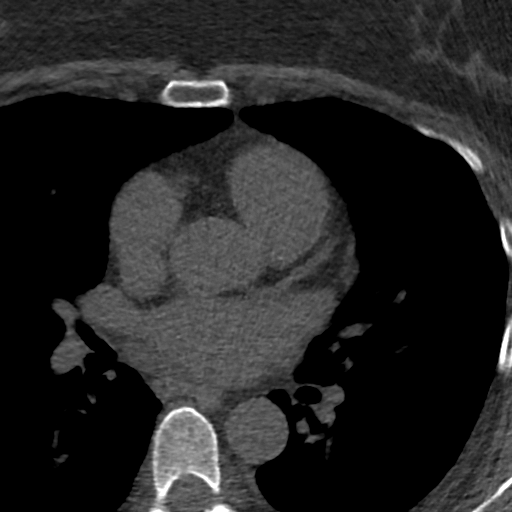
[im 59/72  vessel]
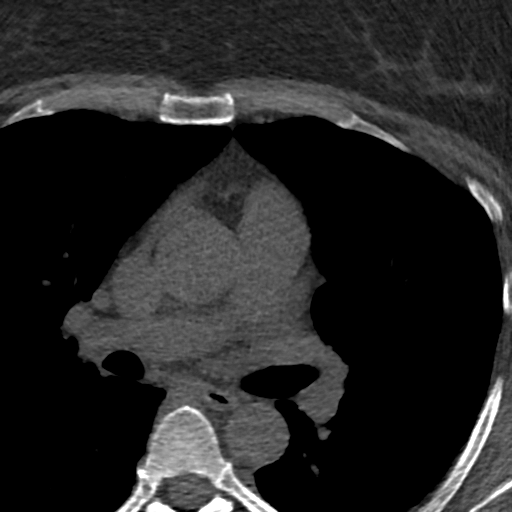
[im 59/72  lung]
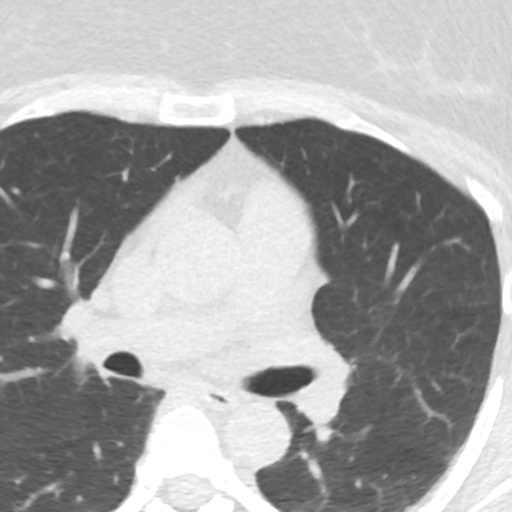
[im 65/72  vessel]
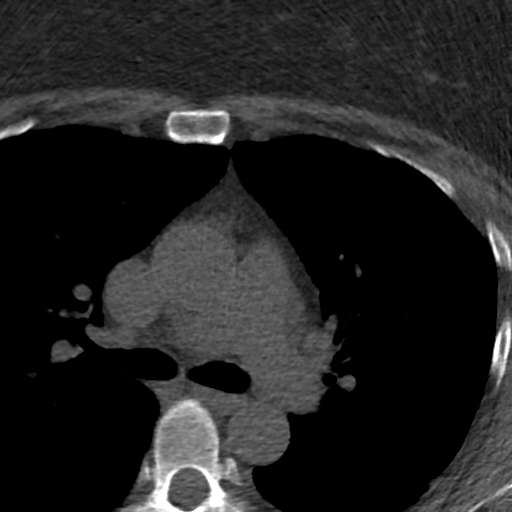

[Series 5: lung st 71 % · axial · 0.61mm/px · z∈[+1341,+1422]mm · 5 of 48 slices shown]
[im 7/48  lung]
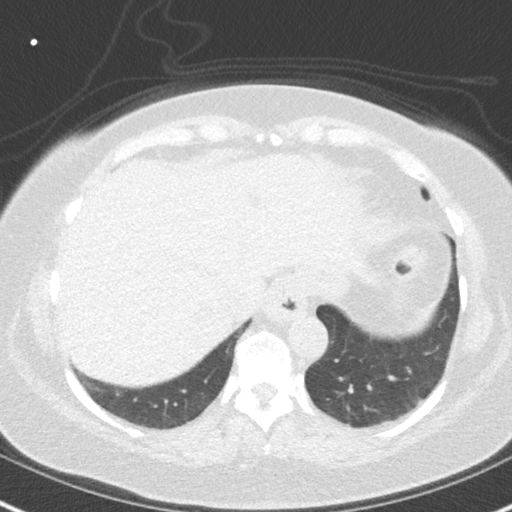
[im 14/48  lung]
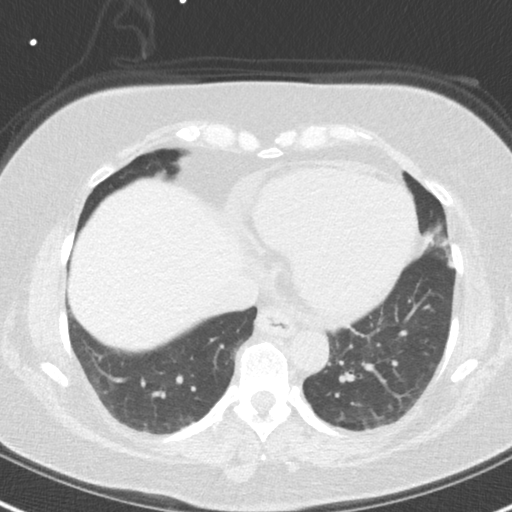
[im 21/48  lung]
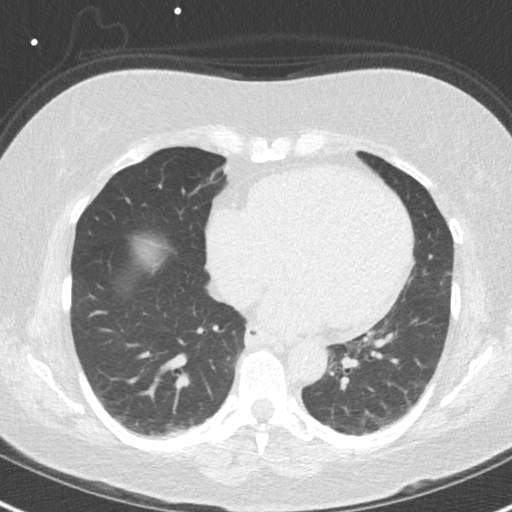
[im 27/48  lung]
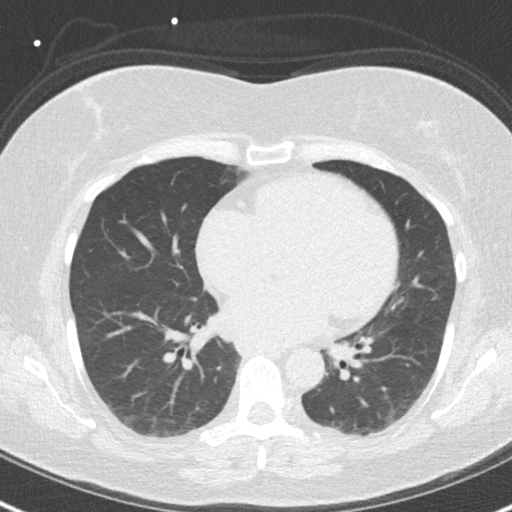
[im 34/48  lung]
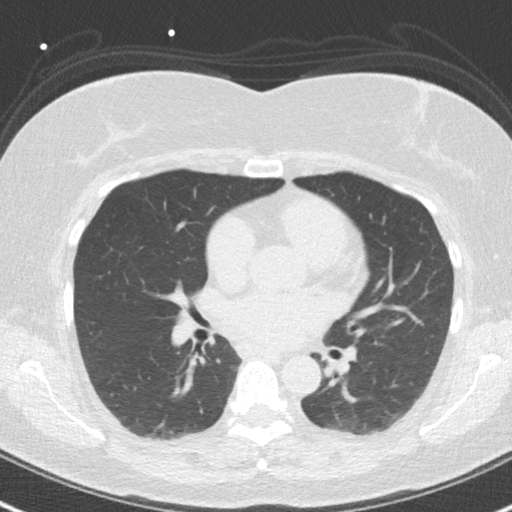

[15 of 20 positions shown; findings below may reference images not displayed]

FINDINGS: Vascular: Normal aortic caliber.

Mediastinum/Nodes: No imaged thoracic adenopathy. Small hiatal
hernia.

Lungs/Pleura: No pleural fluid. Lingular scarring. Minimal dependent
subsegmental atelectasis in both lower lobes.

Upper Abdomen: Segment 2 1 cm hepatic cyst. Normal imaged portions
of the spleen.

Musculoskeletal: No acute osseous abnormality.
IMPRESSION: 1.  No acute findings in the imaged extracardiac chest.
2. Small hiatal hernia.
FINDINGS: Non-cardiac: See separate report from [REDACTED].

Ascending Aorta: Normal size

Pericardium: Normal

Coronary arteries: Normal origin of left and right coronary
arteries. Distribution of arterial calcifications if present, as
noted below;

LM 0

LAD 0

LCx 0

RCA 0

Total 0

IMPRESSION AND RECOMMENDATION:
1. Normal coronary calcium score of 0. Patient is low risk for
coronary events.

2. CAC 0, SATOSHI0.

3. Continue heart healthy lifestyle and risk factor modification.

SATOSHI

*** End of Addendum ***
EXAM:
OVER-READ INTERPRETATION  CT CHEST

The following report is an over-read performed by radiologist Dr.
over-read does not include interpretation of cardiac or coronary
anatomy or pathology. The calcium score interpretation by the
cardiologist is attached.
FINDINGS: Vascular: Normal aortic caliber.

Mediastinum/Nodes: No imaged thoracic adenopathy. Small hiatal
hernia.

Lungs/Pleura: No pleural fluid. Lingular scarring. Minimal dependent
subsegmental atelectasis in both lower lobes.

Upper Abdomen: Segment 2 1 cm hepatic cyst. Normal imaged portions
of the spleen.

Musculoskeletal: No acute osseous abnormality.
IMPRESSION: 1.  No acute findings in the imaged extracardiac chest.
2. Small hiatal hernia.

## 2021-05-02 ENCOUNTER — Other Ambulatory Visit: Payer: Self-pay | Admitting: Neurosurgery

## 2021-05-02 ENCOUNTER — Other Ambulatory Visit (HOSPITAL_COMMUNITY): Payer: Self-pay | Admitting: Neurosurgery

## 2021-05-02 ENCOUNTER — Other Ambulatory Visit (HOSPITAL_BASED_OUTPATIENT_CLINIC_OR_DEPARTMENT_OTHER): Payer: Self-pay | Admitting: Neurosurgery

## 2021-05-02 DIAGNOSIS — M5440 Lumbago with sciatica, unspecified side: Secondary | ICD-10-CM

## 2021-05-03 ENCOUNTER — Ambulatory Visit: Payer: 59 | Admitting: Internal Medicine

## 2021-05-09 ENCOUNTER — Other Ambulatory Visit: Payer: Self-pay | Admitting: Internal Medicine

## 2021-05-09 DIAGNOSIS — R110 Nausea: Secondary | ICD-10-CM

## 2021-05-09 DIAGNOSIS — K219 Gastro-esophageal reflux disease without esophagitis: Secondary | ICD-10-CM

## 2021-05-10 ENCOUNTER — Encounter: Payer: Self-pay | Admitting: Internal Medicine

## 2021-05-11 ENCOUNTER — Other Ambulatory Visit: Payer: Self-pay

## 2021-05-11 ENCOUNTER — Ambulatory Visit (HOSPITAL_COMMUNITY)
Admission: RE | Admit: 2021-05-11 | Discharge: 2021-05-11 | Disposition: A | Discharge status: Home / Self Care | Payer: 59 | Source: Ambulatory Visit | Attending: Neurosurgery | Admitting: Neurosurgery

## 2021-05-11 DIAGNOSIS — M5440 Lumbago with sciatica, unspecified side: Secondary | ICD-10-CM | POA: Diagnosis present

## 2021-05-11 IMAGING — RF DG MYELOGRAM LUMBAR
8 series · 8 of 8 positions shown · non-contrast
Comparison: CT lumbar spine without contrast [DATE]. lumbar MRI
[DATE].

CLINICAL DATA: 55-year-old female with prior lumbar surgery. Low
back pain radiating to both legs.

EXAM:
LUMBAR MYELOGRAM
FLUOROSCOPY TIME:  0 minutes 36 seconds
PROCEDURE:
Lumbar puncture and intrathecal contrast administration were
performed by Dr. TIGER who will separately report for the
portion of the procedure. I personally supervised acquisition of the
myelogram images.
TECHNIQUE: Contiguous axial images were obtained through the Lumbar spine after
the intrathecal infusion of infusion. Coronal and sagittal
reconstructions were obtained of the axial image sets.

[Series 1: cp_standard · 0.18mm/px · 1 of 1 slices shown]
[im 1/1]
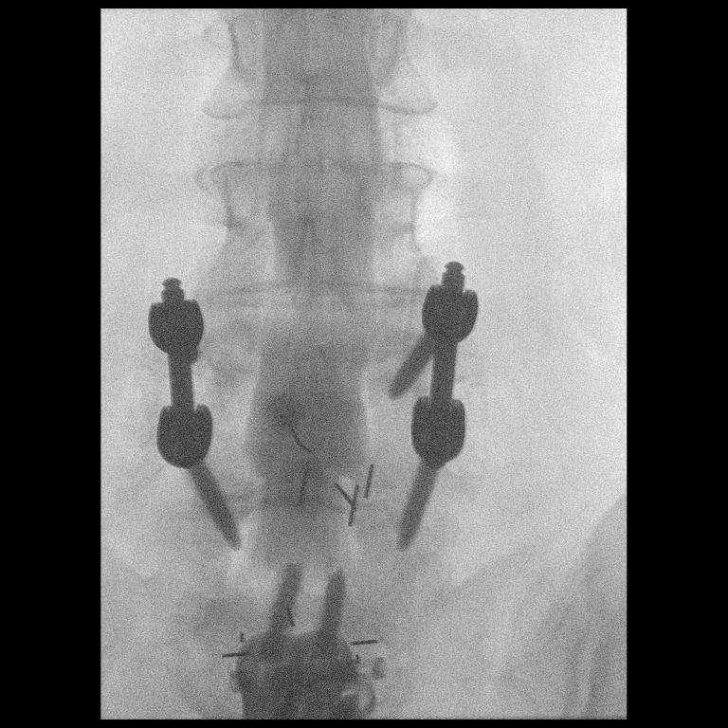

[Series 2: fluoro_myelogram_singleshot_bw · 0.20mm/px · 1 of 1 slices shown (1 of 7)]
[im 1/1]
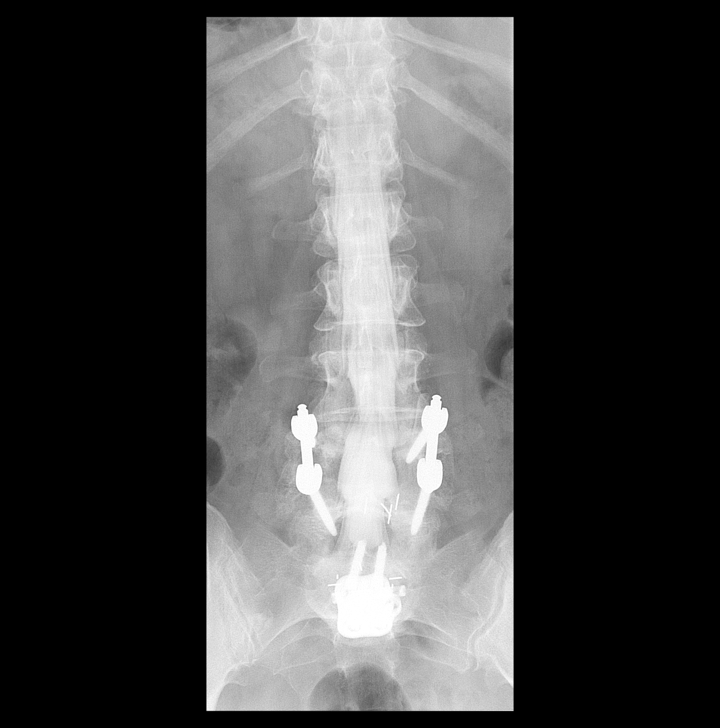

[Series 3: fluoro_myelogram_singleshot_bw · 0.20mm/px · 1 of 1 slices shown (2 of 7)]
[im 1/1]
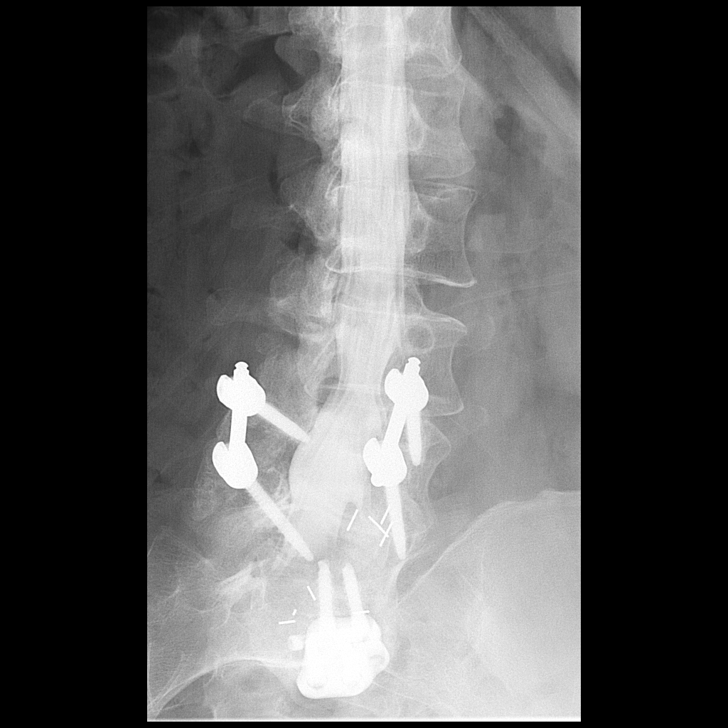

[Series 4: fluoro_myelogram_singleshot_bw · 0.20mm/px · 1 of 1 slices shown (3 of 7)]
[im 1/1]
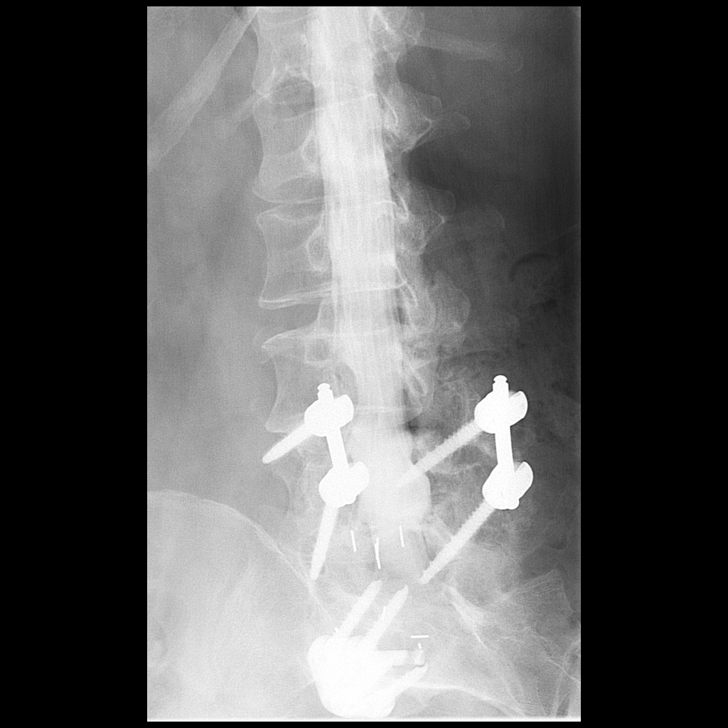

[Series 5: fluoro_myelogram_singleshot_bw · 0.20mm/px · 1 of 1 slices shown (4 of 7)]
[im 1/1]
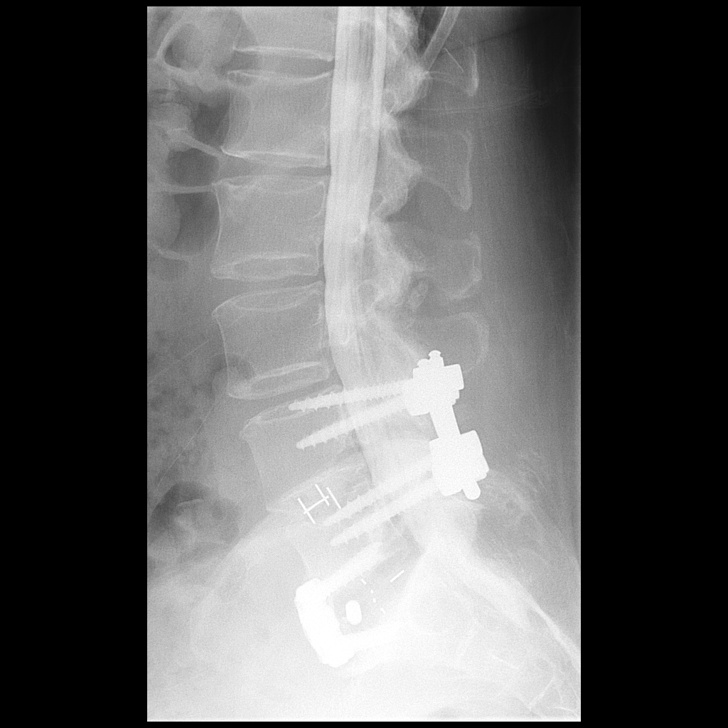

[Series 6: fluoro_myelogram_singleshot_bw · 0.18mm/px · 1 of 1 slices shown (5 of 7)]
[im 1/1]
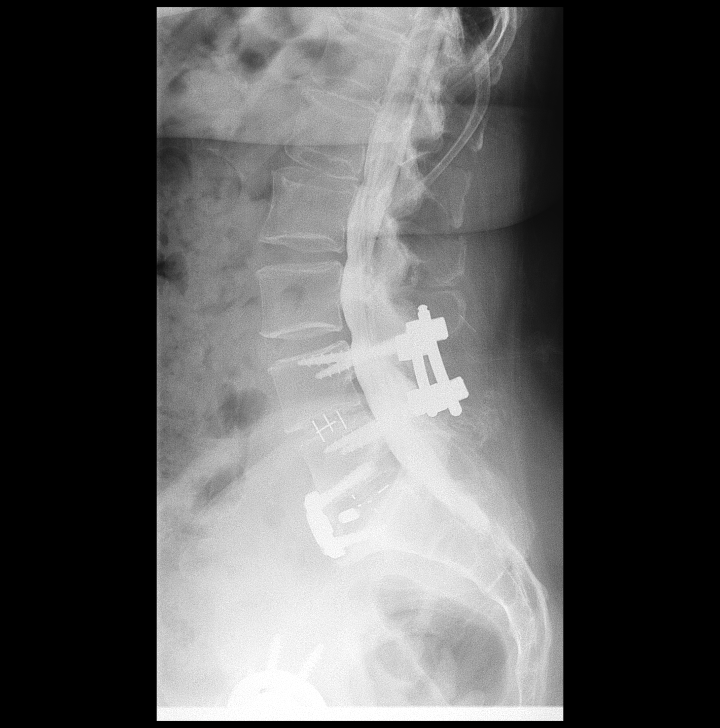

[Series 7: fluoro_myelogram_singleshot_bw · 0.17mm/px · 1 of 1 slices shown (6 of 7)]
[im 1/1]
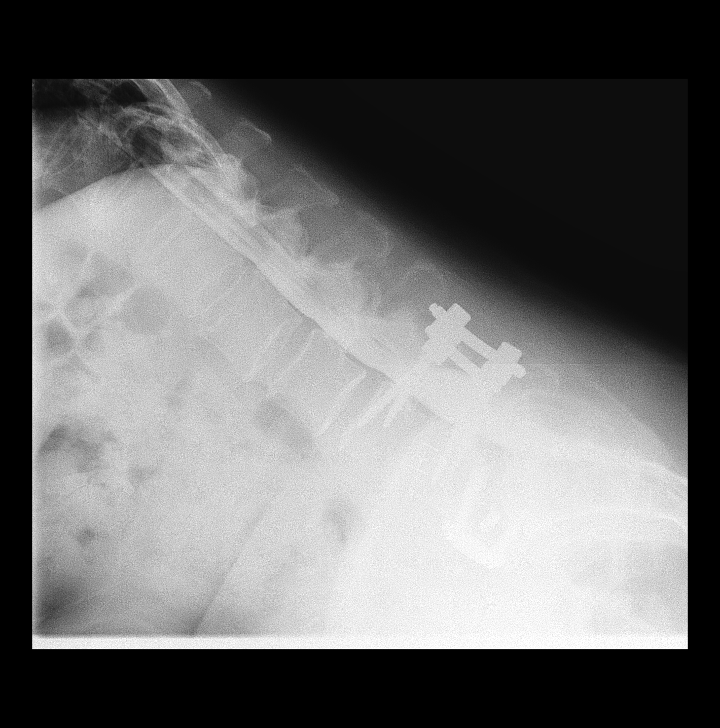

[Series 8: fluoro_myelogram_singleshot_bw · 0.17mm/px · 1 of 1 slices shown (7 of 7)]
[im 1/1]
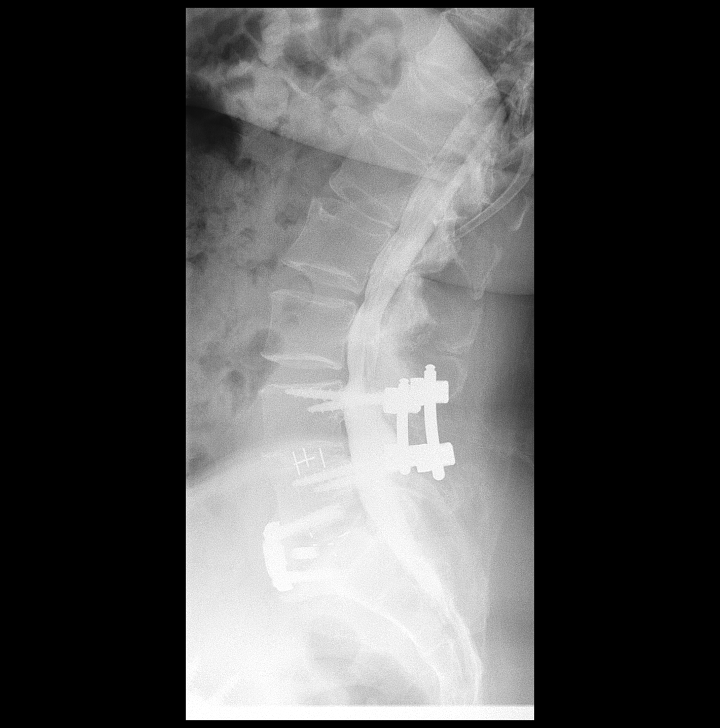

[8 of 8 positions shown; findings below may reference images not displayed]

FINDINGS: LUMBAR MYELOGRAM FINDINGS:

Stable lumbar lordosis. Chronic L4-L5 and L5-S1 hardware appears
intact, see below. Good intrathecal contrast opacification from the
lower thoracic through the lumbar spine. There is mild mostly
lateral mass effect on the thecal sac at L3-L4. And mild ventral
defect there on the thecal sac with weight-bearing. Smaller ventral
defect at L2-L3.

Upright lateral views in neutral, flexion, and extension. No
abnormal motion.

CT LUMBAR MYELOGRAM FINDINGS:

Segmentation:  Normal, same numbering system used before.

Alignment:  Stable lumbar lordosis.  No spondylolisthesis.

Vertebrae: Postoperative details are below. Intact visible sacrum
and SI joints. No acute osseous abnormality identified.

Paraspinal and other soft tissues: Small volume of soft tissue gas
tracking toward the L5 spinous process, lumbar puncture related.
Otherwise stable postoperative changes to the posterior paraspinal
soft tissues. Negative visible noncontrast abdominal viscera.

Disc levels:

Good intrathecal contrast opacification. Normal myelographic
appearance of the lower thoracic spinal canal through the tip of the
conus medullaris which is at L1.

L1-L2:  Negative.

L2-L3: Subtle disc bulging. Mild to moderate facet hypertrophy. No
stenosis.

L3-L4: Mild circumferential disc bulge. Mild ligament flavum and
moderate facet hypertrophy. Mild lateral mass effect on the thecal
sac appears increased from the TIGER MRI (series 4, image 72),
although no convincing spinal stenosis. And furthermore, there is
suggestion of developing facet ankylosis on the left series 7 image
52, new from last year. Up to mild left L3 foraminal stenosis
appears stable.

L4-L5: Sequelae of decompression and fusion. Stable bilateral
pedicle screws and interbody implant. Progressed interbody and solid
appearing right posterior element arthrodesis since last year. No
stenosis.

L5-S1: Sequelae of anterior interbody fusion. There remains some
lucency at the S1 cortical screws (series 4, image 112). But no
other adverse hardware features. Increased interbody calcification
or ossification since last year. Early interbody arthrodesis is
possible, and there also appears to be developing right side facet
fusion on series 7, image 36. No stenosis.
IMPRESSION: 1. Prior decompression and fusion at L4-L5 and L5-S1. No abnormal
motion in flexion/extension.
- L4-L5: Stable to improved appearance since last year, with solid
right posterior element and increasing interbody arthrodesis.
- L5-S1: Subtle lucency along the anterior S1 cortical screws
persists, but there is evidence of developing interbody and right
side posterior element arthrodesis now.

2. Relatively mild adjacent segment disease at L3-L4 - predominantly
facet hypertrophy - and with possible new developing left side facet
ankylosis since last year. Mild new lateral mass effect on the
thecal sac compared to TIGER MRI, but no significant spinal
stenosis. Stable mild left L3 foraminal stenosis.

3. Other lumbar levels are stable with minor disc bulging, and
moderate facet hypertrophy at L2-L3.

## 2021-05-11 MED ORDER — OXYCODONE HCL 5 MG PO TABS
5.0000 mg | ORAL_TABLET | ORAL | Status: DC | PRN
  Administered 2021-05-11: 10 mg via ORAL

## 2021-05-11 MED ORDER — IOHEXOL 180 MG/ML  SOLN
20.0000 mL | Freq: Once | INTRAMUSCULAR | Status: AC | PRN
  Administered 2021-05-11: 20 mL via INTRATHECAL

## 2021-05-11 MED ORDER — DIAZEPAM 5 MG PO TABS
ORAL_TABLET | ORAL | Status: AC
  Filled 2021-05-11: qty 2

## 2021-05-11 MED ORDER — DIAZEPAM 5 MG PO TABS
10.0000 mg | ORAL_TABLET | Freq: Once | ORAL | Status: AC
  Administered 2021-05-11: 10 mg via ORAL

## 2021-05-11 MED ORDER — OXYCODONE HCL 5 MG PO TABS
ORAL_TABLET | ORAL | Status: AC
  Filled 2021-05-11: qty 2

## 2021-05-11 MED ORDER — LIDOCAINE HCL (PF) 1 % IJ SOLN
5.0000 mL | Freq: Once | INTRAMUSCULAR | Status: AC
  Administered 2021-05-11: 5 mL via INTRADERMAL

## 2021-05-11 MED ORDER — ONDANSETRON HCL 4 MG/2ML IJ SOLN: 4.0000 mg | Freq: Four times a day (QID) | INTRAMUSCULAR | Status: DC | PRN

## 2021-05-11 NOTE — Op Note (Signed)
*   No surgery found * Lumbar Myelogram  PATIENT:  Maureen Randall is a 55 y.o. female With low back pain, staus post lumbar arthrodesis PRE-OPERATIVE DIAGNOSIS:  lumbago  POST-OPERATIVE DIAGNOSIS:  lumbago  PROCEDURE:  Lumbar Myelogram  SURGEON:  Blinda Turek  ANESTHESIA:   local LOCAL MEDICATIONS USED:  LIDOCAINE  Procedure Note: Maureen Randall is a 55 y.o. female Was taken to the fluoroscopy suite and  positioned prone on the fluoroscopy table. Her back was prepared and draped in a sterile manner. I infiltrated 6 cc into the lumbar region. I then introduced a spinal needle into the thecal sac at the L4/5 interlaminar space. I infiltrated 20cc of Isovue 180 into the thecal sac. Fluoroscopy showed the needle and contrast in the thecal sac. Maureen Randall tolerated the procedure well. she Will be taken to CT for evaluation.     PATIENT DISPOSITION:  Short Stay

## 2021-05-16 ENCOUNTER — Other Ambulatory Visit: Payer: Self-pay

## 2021-05-16 ENCOUNTER — Telehealth (INDEPENDENT_AMBULATORY_CARE_PROVIDER_SITE_OTHER): Payer: 59 | Admitting: Psychiatry

## 2021-05-16 ENCOUNTER — Encounter: Payer: Self-pay | Admitting: Psychiatry

## 2021-05-16 DIAGNOSIS — G4701 Insomnia due to medical condition: Secondary | ICD-10-CM | POA: Diagnosis not present

## 2021-05-16 DIAGNOSIS — F411 Generalized anxiety disorder: Secondary | ICD-10-CM

## 2021-05-16 MED ORDER — TRAZODONE HCL 100 MG PO TABS: 200.0000 mg | ORAL_TABLET | Freq: Every evening | ORAL | 1 refills | Status: DC | PRN

## 2021-05-16 MED ORDER — DULOXETINE HCL 60 MG PO CPEP: ORAL_CAPSULE | ORAL | 1 refills | Status: DC

## 2021-05-16 MED ORDER — HYDROXYZINE HCL 10 MG PO TABS: 10.0000 mg | ORAL_TABLET | Freq: Every day | ORAL | 1 refills | Status: DC | PRN

## 2021-05-16 NOTE — Progress Notes (Signed)
Virtual Visit via Video Note  I connected with [name] on 05/16/21 at  2:20 PM EST by a video enabled telemedicine application and verified that I am speaking with the correct person using two identifiers.  Location Provider Location : [facility] Patient Location : Home  Participants: Patient , Provider   I discussed the limitations of evaluation and management by telemedicine and the availability of in person appointments. The patient expressed understanding and agreed to proceed.    I discussed the assessment and treatment plan with the patient. The patient was provided an opportunity to ask questions and all were answered. The patient agreed with the plan and demonstrated an understanding of the instructions.   The patient was advised to call back or seek an in-person evaluation if the symptoms worsen or if the condition fails to improve as anticipated.  [place] MD OP Progress Note  05/16/2021 2:41 PM PEGAH SEGEL  MRN:  [id number]  Chief Complaint:  Chief Complaint   Follow-up; Anxiety    HPI: Maureen Randall is a 55 year old Caucasian female who has a history of GAD, insomnia, chronic pain, primary osteoarthritis, lumbar radiculopathy, degenerative disc disease, spondylolisthesis, bilateral carpal tunnel syndrome was evaluated by telemedicine today.  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  She reports it is still sore and she does struggle with surgical pain.  She does have upcoming appointment with a neurosurgeon for her pain.  She is currently on Belbuca.  She reports when she took the belbuca in the evening ,it did keep her up at night.  She hence has been trying to take it during the day.  She takes trazodone 200 mg at bedtime which helps with sleep.  She does have anxiety on and off and the hydroxyzine 25 mg is too strong.  She has been taking hydroxyzine 10 mg from a previous prescription and that has been  beneficial.  She is compliant on the Cymbalta.  Denies side effects.  Patient denies any suicidality, homicidality or perceptual disturbances.  Patient denies any other concerns today.   Visit Diagnosis:    ICD-10-CM   1. GAD (generalized anxiety disorder)  F41.1 hydrOXYzine (ATARAX/VISTARIL) 10 MG tablet    traZODone (DESYREL) 100 MG tablet    2. Insomnia due to medical condition  G47.01 hydrOXYzine (ATARAX/VISTARIL) 10 MG tablet    DULoxetine (CYMBALTA) 60 MG capsule   pain      Past Psychiatric History: Reviewed past psychiatric history from progress note on 04/30/2018  Past Medical History:  Past Medical History:  Diagnosis Date   Anxiety    Arthritis    osteoarthritis-joints, feet, shoulder   Asthma    previously diagnosed after a bout of bronchitis/ no problems since   Bursitis    b/l hips   Chronic pain syndrome    COPD (chronic obstructive pulmonary disease) ([place]) 2004   patient states probably bronchitis   Depression    DVT (deep venous thrombosis) ([place]) 2012   right leg. after surgery, while on BCP   GERD (gastroesophageal reflux disease)    Hyperlipidemia    Left leg DVT ([place])    fem/pop 12/07/20 after right shoulder surgery 11/30/20   Neuropathy    post surgery   PVD (peripheral vascular disease) ([place])    Spondylolisthesis     Past Surgical History:  Procedure Laterality Date   ABLATION     veins in lower legs   BACK SURGERY  07/2014  lumbar spine/ fusion 1 level   BREAST BIOPSY Left 2011   neg   CARPAL TUNNEL RELEASE Right 12/2015   [facility]   CARPAL TUNNEL RELEASE Left 12/2016   COLONOSCOPY WITH PROPOFOL N/A 02/29/2020   Procedure: COLONOSCOPY WITH PROPOFOL;  Surgeon: [name], MD;  Location: [facility];  Service: Endoscopy;  Laterality: N/A;   ESOPHAGOGASTRODUODENOSCOPY (EGD) WITH PROPOFOL N/A 02/29/2020   Procedure: ESOPHAGOGASTRODUODENOSCOPY (EGD) WITH PROPOFOL;  Surgeon: [name], MD;  Location: [facility];   Service: Endoscopy;  Laterality: N/A;   FOOT SURGERY Right 2015   broken navicular bone   HIP ARTHROSCOPY Right 05/2011   HIP SURGERY     left hip surgery 05/2018   JOINT REPLACEMENT Bilateral 12/2017 & 05/2018   hip   L5-S1 OLIF Dr. Rennis Harding back surgery 09/2019  09/28/2019   left ganglion cyst removal     12/23/19 Dr. Elvina Mattes   MASS EXCISION Left 12/17/2019   Procedure: EXCISION TUMOR FOOT DEEP LEFT;  Surgeon: Albertine Patricia, DPM;  Location: [facility];  Service: Podiatry;  Laterality: Left;  LMA W/LOCAL   NASAL TURBINATE REDUCTION Bilateral 06/12/2019   Procedure: TURBINATE REDUCTION/SUBMUCOSAL RESECTION;  Surgeon: [name], MD;  Location: [facility];  Service: ENT;  Laterality: Bilateral;   REPLACEMENT TOTAL HIP W/  RESURFACING IMPLANTS Right 12/2017   [facility]   right shoulder surgery     11/28/20 for bone spur, torn labrum, re attached biceps tendon, tear Dr. [name]   ROTATOR CUFF REPAIR     Dr. [name] in 04/2019    SCLEROTHERAPY     veins in lower legs   SEPTOPLASTY Bilateral 06/12/2019   Procedure: SEPTOPLASTY;  Surgeon: [name], MD;  Location: [facility];  Service: ENT;  Laterality: Bilateral;   SHOULDER SURGERY Right 12/2015   [facility] torn rotator cuff   SPINAL FUSION  2016   [facility] lumbar    Family Psychiatric History: Reviewed family psychiatric history from progress note on 04/30/2018  Family History:  Family History  Problem Relation Age of Onset   Breast cancer Maternal Aunt    Breast cancer Paternal Aunt    Dementia Paternal Aunt    COPD Mother        quit smoking after 53 years   Arthritis Mother    Osteoarthritis Mother    Dementia Father        symptoms started in his 24s   Alzheimer's disease Father    Rheum arthritis Father    Rheum arthritis Other        FH   Dementia Other        "all my dad's side of the family"   Other Maternal Grandmother        got dementia as she got older     Dementia Paternal Grandmother     Social History: Reviewed social history from progress note on 04/30/2018 Social History   Socioeconomic History   Marital status: Married    Spouse name: fredrick   Number of children: 1   Years of education: Not on file   Highest education level: High school graduate  Occupational History    Comment: full time  Tobacco Use   Smoking status: Never   Smokeless tobacco: Never  Vaping Use   Vaping Use: Never used  Substance and Sexual Activity   Alcohol use: Yes    Alcohol/week: 1.0 standard drink    Types: 1 Cans of beer per week  Comment: may have drink 1x/month   Drug use: Never   Sexual activity: Yes  Other Topics Concern   Not on file  Social History Narrative   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    Married 1 child    Lives at home with her husband   Right handed   Caffeine: maybe 1 cup/day (pepsi)   Social Determinants of Health   Financial Resource Strain: Not on file  Food Insecurity: Not on file  Transportation Needs: Not on file  Physical Activity: Not on file  Stress: Not on file  Social Connections: Not on file    Allergies:  Allergies  Allergen Reactions   Amoxicillin Hives   Septra [Sulfamethoxazole-Trimethoprim] Hives        Sulfamethoxazole    Trimethoprim    Cephalexin Rash         Metabolic Disorder Labs: Lab Results  Component Value Date   HGBA1C 5.5 09/25/2019   No results found for: PROLACTIN Lab Results  Component Value Date   CHOL 226 (H) 01/23/2021   TRIG 113.0 01/23/2021   HDL 68.10 01/23/2021   CHOLHDL 3 01/23/2021   VLDL 22.6 01/23/2021   LDLCALC 135 (H) 01/23/2021   LDLCALC 151 (H) 09/22/2020   Lab Results  Component Value Date   TSH 1.49 09/22/2020   TSH 3.78 01/05/2019    Therapeutic Level Labs: No results found for: LITHIUM No results found for: VALPROATE No components found for:  CBMZ  Current Medications: Current Outpatient Medications  Medication Sig  Dispense Refill   acetaminophen (TYLENOL) 650 MG CR tablet Take 1,300 mg by mouth every 8 (eight) hours as needed for pain.     azithromycin (ZITHROMAX) 500 MG tablet Take 500 mg by mouth once.     baclofen (LIORESAL) 10 MG tablet Take 10 mg by mouth 3 (three) times daily as needed for muscle spasms.     BELBUCA 450 MCG FILM Take 450 mcg by mouth every 12 (twelve) hours as needed (pain).     cetirizine (ZYRTEC) 10 MG tablet Take 10 mg by mouth daily as needed for allergies.     dexlansoprazole (DEXILANT) 60 MG capsule Take 1 capsule (60 mg total) by mouth daily. (Patient taking differently: Take 60 mg by mouth at bedtime.) 90 capsule 1   diclofenac Sodium (VOLTAREN) 1 % GEL Apply 2-4 g topically 4 (four) times daily. 2 gram upper body qid prn and 4 gram lower body qid prn (Patient taking differently: Apply 2-4 g topically 4 (four) times daily as needed (pain).) 100 g 11   doxycycline (VIBRA-TABS) 100 MG tablet Take 1 tablet (100 mg total) by mouth 2 (two) times daily. (Patient not taking: No sig reported) 10 tablet 0   DULoxetine (CYMBALTA) 60 MG capsule TAKE 1 CAPSULE BY MOUTH EVERY DAY 90 capsule 1   Elastic Bandages & Supports (RELIEF KNEE) MISC Apply in the morning and remove at night.     estradiol (ESTRACE) 0.1 MG/GM vaginal cream Place 1 Applicatorful vaginally every 14 (fourteen) days.     famotidine (PEPCID) 20 MG tablet TAKE 1 TABLET BY MOUTH EVERYDAY AT BEDTIME 90 tablet 0   fluticasone (FLONASE) 50 MCG/ACT nasal spray Place 1-2 sprays into both nostrils daily as needed. Max dose 2 sprays each nostril (Patient taking differently: Place 2 sprays into both nostrils daily as needed for allergies.) 16 g 12   hydrOXYzine (ATARAX/VISTARIL) 10 MG tablet Take 1 tablet (10 mg total) by mouth daily as needed for  anxiety. 90 tablet 1   linaclotide (LINZESS) 145 MCG CAPS capsule Take 1 capsule (145 mcg total) by mouth daily before breakfast. (Patient not taking: No sig reported) 30 capsule 0    melatonin 5 MG TABS Take 5 mg by mouth at bedtime.     nitrofurantoin (MACRODANTIN) 100 MG capsule Take 100 mg by mouth 2 (two) times daily as needed (uti symptoms).     ondansetron (ZOFRAN) 8 MG tablet TAKE 1 TABLET BY MOUTH EVERY 8 HOURS AS NEEDED FOR NAUSEA OR VOMITING. 18 tablet 6   oxyCODONE-acetaminophen (PERCOCET) 10-325 MG tablet Take 1 tablet by mouth every 6 (six) hours as needed for pain.     phentermine (ADIPEX-P) 37.5 MG tablet Take 1 tablet (37.5 mg total) by mouth daily before breakfast. 60 tablet 0   pregabalin (LYRICA) 50 MG capsule Take 100 mg by mouth 2 (two) times daily.     Red Yeast Rice Extract (RED YEAST RICE PO) Take 1,200 mg by mouth daily.     rivaroxaban (XARELTO) 20 MG TABS tablet Take 1 tablet by mouth daily.     traZODone (DESYREL) 100 MG tablet Take 2 tablets (200 mg total) by mouth at bedtime as needed for sleep. 180 tablet 1   valACYclovir (VALTREX) 500 MG tablet Take 500 mg by mouth daily as needed (outbreaks).     No current facility-administered medications for this visit.     Musculoskeletal: Strength & Muscle Tone:  UTA Gait & Station:  Seated Patient leans: N/A  Psychiatric Specialty Exam: Review of Systems  Musculoskeletal:        Left sided LE pain - s/p surgery - transection of peroneal nerve  Psychiatric/Behavioral:  The patient is nervous/anxious.   All other systems reviewed and are negative.  There were no vitals taken for this visit.There is no height or weight on file to calculate BMI.  General Appearance: Casual  Eye Contact:  Fair  Speech:  Clear and Coherent  Volume:  Normal  Mood:  Anxious  Affect:  Congruent  Thought Process:  Goal Directed and Descriptions of Associations: Intact  Orientation:  Full (Time, Place, and Person)  Thought Content: Logical   Suicidal Thoughts:  No  Homicidal Thoughts:  No  Memory:  Immediate;   Fair Recent;   Fair Remote;   Fair  Judgement:  Fair  Insight:  Fair  Psychomotor Activity:  Normal   Concentration:  Concentration: Fair and Attention Span: Fair  Recall:  [employer] of Knowledge: Fair  Language: Fair  Akathisia:  No  Handed:  Right  AIMS (if indicated): done, 0  Assets:  Communication Skills Desire for Improvement Housing Social Support  ADL's:  Intact  Cognition: WNL  Sleep:  Fair   Screenings: GAD-7    Flowsheet Row Video Visit from 03/01/2021 in [facility] Video Visit from 10/19/2020 in [facility] Video Visit from 09/21/2020 in [facility] Video Visit from 10/14/2019 in [facility]  Total GAD-7 Score 3 2 4 1       [place] Visit from 09/23/2019 in [facility]  Total Score (max 30 points ) 27      PHQ2-9    Flowsheet Row Video Visit from 05/16/2021 in [facility] Video Visit from 03/01/2021 in [facility] Video Visit from 09/21/2020 in [facility] Video Visit from 10/14/2019 in [facility] Office Visit from 08/14/2019  in [facility]  PHQ-2 Total Score 0 0 1 0 0  PHQ-9 Total Score -- -- -- -- 0      Flowsheet Row DG MYELOGRAM from 05/11/2021 in [facility] from 12/07/2020 in [facility] Video Visit from 09/21/2020 in [facility]  C-SSRS RISK CATEGORY No Risk No Risk No Risk        Assessment and Plan: Maureen Randall is a 55 year old Caucasian female, married, on SSD, lives in [place], has a history of GAD, insomnia, chronic pain, osteoarthritis, degenerative disc disease, multiple surgeries, was evaluated by telemedicine today.  Patient is currently stable on current medication regimen although she continues to struggle with pain.  Plan as noted  below.  Plan GAD-stable Cymbalta 60 mg p.o. daily Change hydroxyzine to 10 mg p.o. daily as needed for severe anxiety.  Insomnia-stable Trazodone 200 mg p.o. nightly as needed Melatonin 3 mg p.o. nightly  Patient to continue to follow up with neurosurgeon, pain management.  Follow-up in clinic in 3 months or sooner in person.  This note was generated in part or whole with voice recognition software. Voice recognition is usually quite accurate but there are transcription errors that can and very often do occur. I apologize for any typographical errors that were not detected and corrected.     Ursula Alert, MD 05/16/2021, 2:41 PM

## 2021-05-24 ENCOUNTER — Other Ambulatory Visit: Payer: Self-pay | Admitting: Neurosurgery

## 2021-05-24 DIAGNOSIS — G971 Other reaction to spinal and lumbar puncture: Secondary | ICD-10-CM

## 2021-05-25 ENCOUNTER — Other Ambulatory Visit: Payer: Self-pay

## 2021-05-25 ENCOUNTER — Ambulatory Visit
Admission: RE | Admit: 2021-05-25 | Discharge: 2021-05-25 | Disposition: A | Discharge status: Home / Self Care | Payer: 59 | Source: Ambulatory Visit | Attending: Neurosurgery | Admitting: Neurosurgery

## 2021-05-25 DIAGNOSIS — G971 Other reaction to spinal and lumbar puncture: Secondary | ICD-10-CM

## 2021-05-25 IMAGING — XA Imaging study
2 series · 2 of 2 positions shown · non-contrast
Comparison: none

CLINICAL DATA: Spinal headache after lumbar puncture for myelogram
2 weeks ago.

[Series 1: ortho standard · 1 of 1 slices shown (1 of 2)]
[im 1/1]
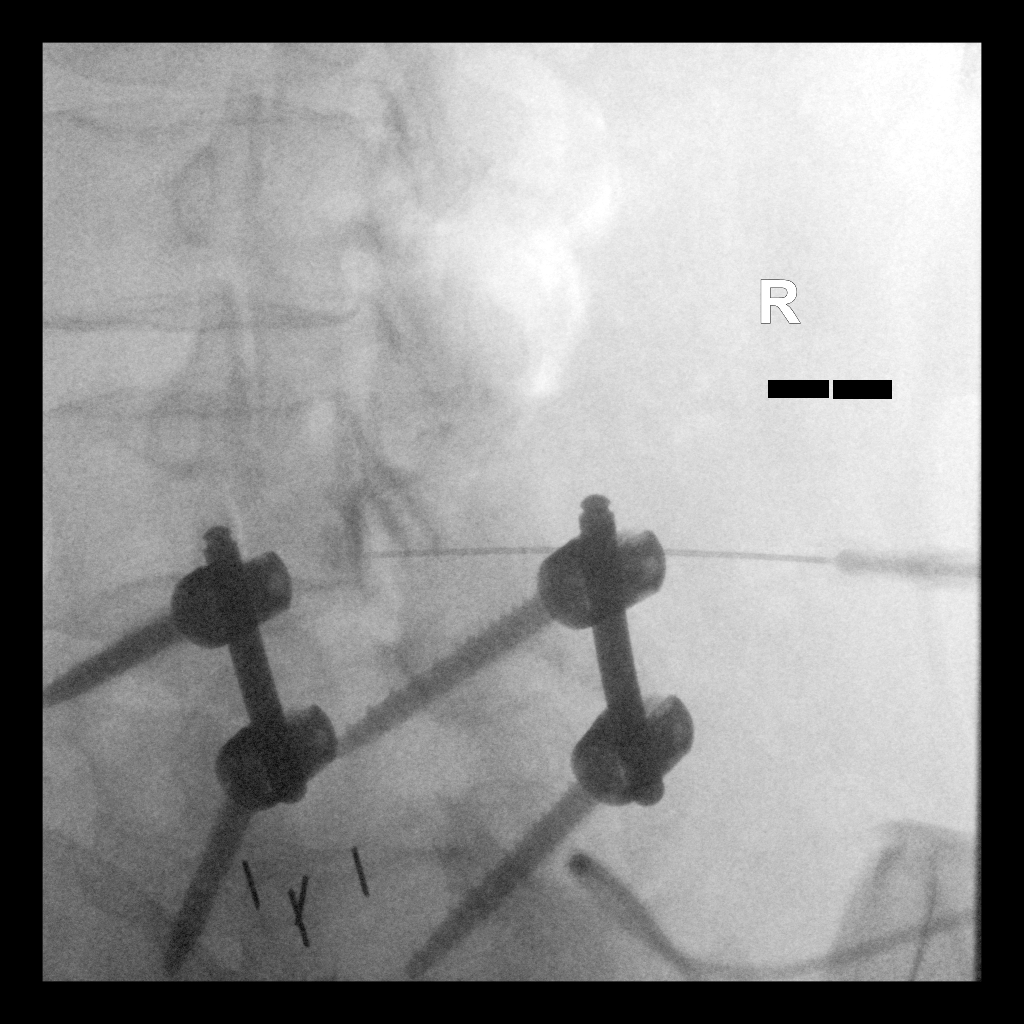

[Series 2: ortho standard · 1 of 1 slices shown (2 of 2)]
[im 1/1]
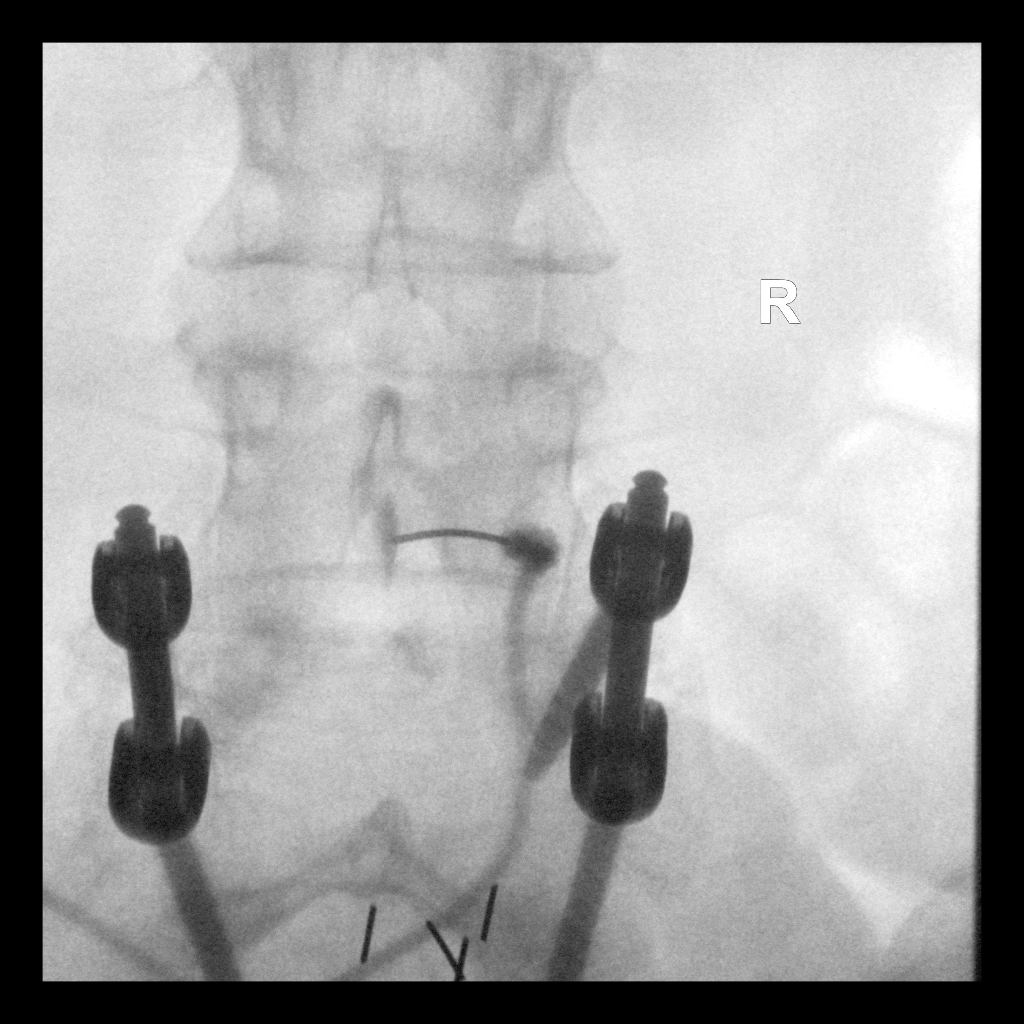

[2 of 2 positions shown; findings below may reference images not displayed]

FLUOROSCOPY TIME:  Radiation Exposure Index (as provided by the
fluoroscopic device): 2.6 mGy

Fluoroscopy Time:  17 seconds

Number of Acquired Images:  0

PROCEDURE:
LUMBAR EPIDURAL BLOOD PATCH INJECTION

After a thorough discussion of risks and benefits of the procedure,
written and verbal consent was obtained.

Prior to the procedure, 20 ml of the patient's blood was harvested
using stringent sterile technique.

An interlaminar approach was performed at L3-L4 on the right. Under
stringent sterile technique, overlying skin was cleansed with
betadine soap and anesthetized with 1% lidocaine without
epinephrine. A 3.5 inch 20 gauge needle was advanced using
loss-of-resistance technique.

DIAGNOSTIC EPIDURAL INJECTION

Injection of Isovue-M 200 shows a good epidural pattern with spread
above and below the level of needle placement, primarily on the side
of needle placement. No vascular or subarachnoid opacification is
seen.

THERAPEUTIC EPIDURAL INJECTION

15 ml of the patient's blood was injected into the epidural space
just above the site of prior lumbar puncture.
IMPRESSION: Technically successful lumbar blood patch at L3-L4.

## 2021-05-25 MED ORDER — IOPAMIDOL (ISOVUE-M 200) INJECTION 41%
1.0000 mL | Freq: Once | INTRAMUSCULAR | Status: AC
  Administered 2021-05-25: 09:00:00 1 mL via EPIDURAL

## 2021-05-25 NOTE — Progress Notes (Signed)
Pt at our office for a blood patch. Pt reports she takes Xarelto once daily but stopped in on her own without a cardiology clearance. Pt reports her last dose of Xarelto was Monday 05/22/21. Dr. Nelia Shi is aware.

## 2021-05-25 NOTE — Progress Notes (Signed)
20cc of blood drawn from pts LAC for blood patch. 1 successful attempt, pt tolerated well. Gauze and tape applied after.

## 2021-05-25 NOTE — Discharge Instructions (Addendum)
Blood Patch Discharge Instructions  Go home and rest quietly for the next 24 hours.  It is important to lie flat for the next 24 hours.  Get up only to go to the restroom.  You may lie in the bed or on a couch on your back, your stomach, your left side or your right side.  You may have one pillow under your head.  You may have pillows between your knees while you are on your side or under your knees while you are on your back.  DO NOT drive today.  Recline the seat as far back as it will go, while still wearing your seat belt, on the way home.  You may get up to go to the bathroom as needed.  You may sit up for 10 minutes to eat.  You may resume your normal diet and medications unless otherwise indicated.  Drink lots of extra fluids today and tomorrow..   You may resume normal activities after your 24 hours of bed rest is over; however, do not exert yourself strongly or do any heavy lifting tomorrow.  Call your physician for a follow-up appointment.   If you have any questions  after you arrive home, please call [phone].  Discharge instructions have been explained to the patient.  The patient, or the person responsible for the patient, fully understands these instructions.    YOU MAY RESUME Maureen Randall TOMORROW MORNING 05/26/21

## 2021-06-06 DIAGNOSIS — T8189XA Other complications of procedures, not elsewhere classified, initial encounter: Secondary | ICD-10-CM

## 2021-06-06 HISTORY — DX: Other complications of procedures, not elsewhere classified, initial encounter: T81.89XA

## 2021-06-07 DIAGNOSIS — T8141XA Infection following a procedure, superficial incisional surgical site, initial encounter: Secondary | ICD-10-CM

## 2021-06-07 DIAGNOSIS — S81802A Unspecified open wound, left lower leg, initial encounter: Secondary | ICD-10-CM

## 2021-06-07 DIAGNOSIS — I82503 Chronic embolism and thrombosis of unspecified deep veins of lower extremity, bilateral: Secondary | ICD-10-CM | POA: Insufficient documentation

## 2021-06-07 HISTORY — DX: Infection following a procedure, superficial incisional surgical site, initial encounter: T81.41XA

## 2021-06-07 HISTORY — DX: Unspecified open wound, left lower leg, initial encounter: S81.802A

## 2021-06-09 ENCOUNTER — Ambulatory Visit: Payer: 59 | Admitting: Internal Medicine

## 2021-06-22 ENCOUNTER — Ambulatory Visit (HOSPITAL_BASED_OUTPATIENT_CLINIC_OR_DEPARTMENT_OTHER): Payer: 59 | Admitting: Internal Medicine

## 2021-07-04 ENCOUNTER — Other Ambulatory Visit: Payer: Self-pay

## 2021-07-04 ENCOUNTER — Encounter: Payer: Self-pay | Admitting: Internal Medicine

## 2021-07-04 ENCOUNTER — Ambulatory Visit (INDEPENDENT_AMBULATORY_CARE_PROVIDER_SITE_OTHER): Payer: 59 | Admitting: Internal Medicine

## 2021-07-04 VITALS — BP 112/74 | HR 84 | Temp 97.1°F | Ht 67.0 in | Wt 207.0 lb

## 2021-07-04 DIAGNOSIS — Z1231 Encounter for screening mammogram for malignant neoplasm of breast: Secondary | ICD-10-CM | POA: Diagnosis not present

## 2021-07-04 DIAGNOSIS — D6851 Activated protein C resistance: Secondary | ICD-10-CM | POA: Insufficient documentation

## 2021-07-04 DIAGNOSIS — M50123 Cervical disc disorder at C6-C7 level with radiculopathy: Secondary | ICD-10-CM

## 2021-07-04 DIAGNOSIS — R296 Repeated falls: Secondary | ICD-10-CM

## 2021-07-04 DIAGNOSIS — K219 Gastro-esophageal reflux disease without esophagitis: Secondary | ICD-10-CM

## 2021-07-04 DIAGNOSIS — Z0000 Encounter for general adult medical examination without abnormal findings: Secondary | ICD-10-CM | POA: Diagnosis not present

## 2021-07-04 DIAGNOSIS — I82561 Chronic embolism and thrombosis of right calf muscular vein: Secondary | ICD-10-CM

## 2021-07-04 DIAGNOSIS — T148XXA Other injury of unspecified body region, initial encounter: Secondary | ICD-10-CM

## 2021-07-04 HISTORY — DX: Other injury of unspecified body region, initial encounter: T14.8XXA

## 2021-07-04 MED ORDER — FAMOTIDINE 20 MG PO TABS: ORAL_TABLET | ORAL | 3 refills | Status: DC

## 2021-07-04 NOTE — Patient Instructions (Addendum)
Call me back ready for PT/OT Consider MRI leg with wound clinic if not totally healing    Hyperbaric Oxygen Therapy Hyperbaric oxygen therapy (HBOT) delivers increased oxygen to the body and tissues. This is usually done inside an enclosed chamber or medical device. HBOT increases concentrations of oxygen in the body and tissues because of the increased air pressure inside the chamber and because the person breathes in a higher percentage of oxygen. By delivering increased oxygen levels to vital organs, the bloodstream, and tissues, increased oxygen concentrations can improve healing and treat various injuries and diseases, such as: Environmental illnesses. These include: Severe carbon monoxide poisoning caused by inhaling carbon monoxide gas. Decompression sickness. This is a group of symptoms that can happen when the air pressure around you changes too quickly and gas bubbles form in your body. Air embolism. This is when air bubbles form in the body and block a blood vessel (arterial air embolism). Wounds or cellular and tissue damage. These include: Severe bacterial infections of skin, bone, or other tissues. Burns. Crush injury to one or more parts of the body. Long-lasting (chronic) wounds that do not heal on their own, such as diabetic ulcers. Wounds related to radiation treatments for cancer. Problems that have not responded well to other treatment methods, such as: Severe anemia. This is when you do not have enough red blood cells, which limits the amount of oxygen that the blood can deliver to different parts of your body. Sudden and specific forms of hearing loss. What are the risks? Risks of HBOT may include: Damage to your ears. Problems with your nose and sinuses. Anxiety caused by being in the small space of the hyperbaric chamber (claustrophobia). Changes in your vision, such as the development of nearsightedness. Oxygen poisoning. Heart and lung problems. This is  rare. Seizures. This is rare. What happens before the procedure? Your medical team will assess whether you would benefit from this treatment. Your medical history will be taken and a physical exam will be done. You may have blood tests or imaging tests. What happens during the procedure? During HBOT, you may lie or sit inside an enclosed space, or chamber. Oxygen will be pumped into the chamber so that you breathe 100% oxygen. Normally, the air that you breathe has 21% oxygen. Inside the chamber, air pressure is usually 2-3 times higher than normal (hyperbaric). The higher pressures increase the delivery of oxygen to your vital organs and tissues. Your health care provider will use the lowest amount of air pressure that is needed. HBOT is often delivered over more than one session. Each session may last from a few minutes to several hours. This therapy is usually done at a hyperbaric center or hospital. The procedure may vary among health care providers and hospitals. What can I expect after procedure? During the procedure, you may feel pressure in your ears or sinuses. This can sometimes be relieved by yawning and should improve after HBOT treatment is finished. Your medical team will reassess your condition. This may include a brief physical exam and rechecking your vital signs. Contact a health care provider if you: Develop hearing or vision problems. Feel continued sinus pressure or congestion. Have trouble breathing or chest pain. Feel like you might be having a seizure. Tell someone if possible. Summary HBOT is a type of medical therapy that delivers increased oxygen to your vital organs, bloodstream, and tissues. This is done to treat a number of conditions including environmental illnesses, anemia, and several types of  severe infections. During HBOT, you will lie inside an enclosed space, or chamber. This therapy is often done at a hyperbaric center or hospital. Increasing the oxygen  concentration and air pressure inside the chamber will increase oxygen delivery to your body. HBOT is often delivered over more than one session. Each session may last from a few minutes to several hours. This information is not intended to replace advice given to you by your health care provider. Make sure you discuss any questions you have with your health care provider. Document Revised: 10/28/2020 Document Reviewed: 10/20/2020 Elsevier Patient Education  [place].

## 2021-07-04 NOTE — Progress Notes (Signed)
Chief Complaint  Patient presents with   Annual Exam   Annual  1. Frequent falls given handicap sticker walks with cane  2. Had myelogram 05/11/21 and h/a x 2 weeks after  3. Left lower leg wound had repair left foot ganglion cyst and then removal of scar tissue then Dr Tyler Pita [facility] severed peroneal nerve and has wound > 1 month open hole seeing wound [facility] tomorrow ro help heal    Review of Systems  Constitutional:  Negative for weight loss.  HENT:  Negative for hearing loss.   Eyes:  Negative for blurred vision.  Respiratory:  Negative for shortness of breath.   Cardiovascular:  Negative for chest pain.  Gastrointestinal:  Negative for abdominal pain and blood in stool.  Genitourinary:  Negative for dysuria.  Musculoskeletal:  Positive for falls. Negative for joint pain.  Skin:  Negative for rash.       +open wound  Neurological:  Negative for headaches.  Psychiatric/Behavioral:  Negative for depression.   Past Medical History:  Diagnosis Date   Anxiety    Arthritis    osteoarthritis-joints, feet, shoulder   Asthma    previously diagnosed after a bout of bronchitis/ no problems since   Bursitis    b/l hips   Chronic pain syndrome    COPD (chronic obstructive pulmonary disease) ([place]) 2004   patient states probably bronchitis   Depression    DVT (deep venous thrombosis) ([place]) 2012   right leg. after surgery, while on BCP   GERD (gastroesophageal reflux disease)    Hyperlipidemia    Left leg DVT ([place])    fem/pop 12/07/20 after right shoulder surgery 11/30/20   Neuropathy    post surgery   PVD (peripheral vascular disease) ([place])    Spondylolisthesis    Past Surgical History:  Procedure Laterality Date   ABLATION     veins in lower legs   BACK SURGERY  07/2014   lumbar spine/ fusion 1 level   BREAST BIOPSY Left 2011   neg   CARPAL TUNNEL RELEASE Right 12/2015   [facility]   CARPAL TUNNEL RELEASE Left 12/2016   COLONOSCOPY WITH PROPOFOL N/A  02/29/2020   Procedure: COLONOSCOPY WITH PROPOFOL;  Surgeon: [name], MD;  Location: [place] [facility];  Service: Endoscopy;  Laterality: N/A;   ESOPHAGOGASTRODUODENOSCOPY (EGD) WITH PROPOFOL N/A 02/29/2020   Procedure: ESOPHAGOGASTRODUODENOSCOPY (EGD) WITH PROPOFOL;  Surgeon: [name], MD;  Location: [place] [facility];  Service: Endoscopy;  Laterality: N/A;   FOOT SURGERY Right 2015   broken navicular bone   HIP ARTHROSCOPY Right 05/2011   HIP SURGERY     left hip surgery 05/2018   JOINT REPLACEMENT Bilateral 12/2017 & 05/2018   hip   L5-S1 OLIF Dr. Rennis Harding back surgery 09/2019  09/28/2019   left ganglion cyst removal     12/23/19 Dr. Elvina Mattes   MASS EXCISION Left 12/17/2019   Procedure: EXCISION TUMOR FOOT DEEP LEFT;  Surgeon: Albertine Patricia, DPM;  Location: [facility];  Service: Podiatry;  Laterality: Left;  LMA W/LOCAL   NASAL TURBINATE REDUCTION Bilateral 06/12/2019   Procedure: TURBINATE REDUCTION/SUBMUCOSAL RESECTION;  Surgeon: [name], MD;  Location: [place];  Service: ENT;  Laterality: Bilateral;   REPLACEMENT TOTAL HIP W/  RESURFACING IMPLANTS Right 12/2017   [facility]   right shoulder surgery     11/28/20 for bone spur, torn labrum, re attached biceps tendon, tear Dr. Malva Cogan CUFF REPAIR  Dr. [name] in 04/2019    SCLEROTHERAPY     veins in lower legs   SEPTOPLASTY Bilateral 06/12/2019   Procedure: SEPTOPLASTY;  Surgeon: [name], MD;  Location: [place];  Service: ENT;  Laterality: Bilateral;   SHOULDER SURGERY Right 12/2015   [facility] torn rotator cuff   SPINAL FUSION  2016   [facility] lumbar   Family History  Problem Relation Age of Onset   Breast cancer Maternal Aunt    Breast cancer Paternal Aunt    Dementia Paternal Aunt    COPD Mother        quit smoking after 4 years   Arthritis Mother    Osteoarthritis Mother    Dementia Father        symptoms started in his 47s    Alzheimer's disease Father    Rheum arthritis Father    Rheum arthritis Other        FH   Dementia Other        "all my dad's side of the family"   Other Maternal Grandmother        got dementia as she got older    Dementia Paternal Grandmother    Social History   Socioeconomic History   Marital status: Married    Spouse name: fredrick   Number of children: 1   Years of education: Not on file   Highest education level: High school graduate  Occupational History    Comment: full time  Tobacco Use   Smoking status: Never   Smokeless tobacco: Never  Vaping Use   Vaping Use: Never used  Substance and Sexual Activity   Alcohol use: Yes    Alcohol/week: 1.0 standard drink    Types: 1 Cans of beer per week    Comment: may have drink 1x/month   Drug use: Never   Sexual activity: Yes  Other Topics Concern   Not on file  Social History Narrative   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   baclofen (LIORESAL) 10 MG tablet Take 10 mg by mouth 3 (three) times daily as needed for muscle spasms.   BELBUCA 450 MCG FILM Take 450 mcg by mouth every 12 (twelve) hours as needed (pain).   Buprenorphine HCl (BELBUCA) 450 MCG FILM Place inside cheek 2 (two) times daily as needed.   cetirizine (ZYRTEC) 10 MG tablet Take 10 mg by mouth daily as needed for allergies.   dexlansoprazole (DEXILANT) 60 MG capsule Take 1 capsule (60 mg total) by mouth daily. (Patient taking  differently: Take 60 mg by mouth at bedtime.)   diclofenac Sodium (VOLTAREN) 1 % GEL Apply 2-4 g topically 4 (four) times daily. 2 gram upper body qid prn and 4 gram lower body qid prn (Patient taking differently: Apply 2-4 g topically 4 (four) times daily as needed (pain).)   DULoxetine (CYMBALTA) 60 MG capsule TAKE  1 CAPSULE BY MOUTH EVERY DAY   Elastic Bandages & Supports (RELIEF KNEE) MISC Apply in the morning and remove at night.   estradiol (ESTRACE) 0.1 MG/GM vaginal cream Place 1 Applicatorful vaginally every 14 (fourteen) days.   fluticasone (FLONASE) 50 MCG/ACT nasal spray Place 1-2 sprays into both nostrils daily as needed. Max dose 2 sprays each nostril (Patient taking differently: Place 2 sprays into both nostrils daily as needed for allergies.)   hydrOXYzine (ATARAX/VISTARIL) 10 MG tablet Take 1 tablet (10 mg total) by mouth daily as needed for anxiety.   linaclotide (LINZESS) 145 MCG CAPS capsule Take 1 capsule (145 mcg total) by mouth daily before breakfast.   ondansetron (ZOFRAN) 8 MG tablet TAKE 1 TABLET BY MOUTH EVERY 8 HOURS AS NEEDED FOR NAUSEA OR VOMITING.   oxyCODONE-acetaminophen (PERCOCET) 10-325 MG tablet Take 1 tablet by mouth every 6 (six) hours as needed for pain.   phentermine (ADIPEX-P) 37.5 MG tablet Take 1 tablet (37.5 mg total) by mouth daily before breakfast.   pregabalin (LYRICA) 50 MG capsule Take 100 mg by mouth 2 (two) times daily.   Red Yeast Rice Extract (RED YEAST RICE PO) Take 1,200 mg by mouth daily.   rivaroxaban (XARELTO) 20 MG TABS tablet Take 1 tablet by mouth daily.   traZODone (DESYREL) 100 MG tablet Take 2 tablets (200 mg total) by mouth at bedtime as needed for sleep.   valACYclovir (VALTREX) 500 MG tablet Take 500 mg by mouth daily as needed (outbreaks).   [DISCONTINUED] famotidine (PEPCID) 20 MG tablet TAKE 1 TABLET BY MOUTH EVERYDAY AT BEDTIME   Allergies  Allergen Reactions   Amoxicillin Hives   Septra [Sulfamethoxazole-Trimethoprim] Hives         Sulfamethoxazole    Trimethoprim    Cephalexin Rash        No results found for this or any previous visit (from the past 2160 hour(s)). Objective  Body mass index is 32.42 kg/m. Wt Readings from Last 3 Encounters:  07/04/21 207 lb (93.9 kg)  05/11/21 205 lb (93 kg)  04/04/21 207 lb 6.4 oz (94.1 kg)   Temp Readings from Last 3 Encounters:  07/04/21 (!) 97.1 F (36.2 C) (Temporal)  05/11/21 97.8 F (36.6 C) (Oral)  04/03/21 98.5 F (36.9 C) (Oral)   BP Readings from Last 3 Encounters:  07/04/21 112/74  05/25/21 116/62  05/11/21 (!) 99/59   Pulse Readings from Last 3 Encounters:  07/04/21 84  05/25/21 64  05/11/21 72    Physical Exam Vitals and nursing note reviewed.  Constitutional:      Appearance: Normal appearance. She is well-developed and well-groomed.  HENT:     Head: Normocephalic and atraumatic.  Eyes:     Conjunctiva/sclera: Conjunctivae normal.     Pupils: Pupils are equal, round, and reactive to light.  Cardiovascular:     Rate and Rhythm: Normal rate and regular rhythm.     Heart sounds: Normal heart sounds. No murmur heard. Pulmonary:     Effort: Pulmonary effort is normal.     Breath sounds: Normal breath sounds.  Abdominal:     General: Abdomen is flat. Bowel sounds are normal.     Tenderness: There is no abdominal tenderness.  Musculoskeletal:        General: No tenderness.  Skin:    General: Skin is warm and dry.  Neurological:     General: No focal deficit present.     Mental Status: She is alert and oriented to person, place,  and time. Mental status is at baseline.     Cranial Nerves: Cranial nerves 2-12 are intact.     Gait: Gait is intact.  Psychiatric:        Attention and Perception: Attention and perception normal.        Mood and Affect: Mood and affect normal.        Speech: Speech normal.        Behavior: Behavior normal. Behavior is cooperative.        Thought Content: Thought content normal.        Cognition and  Memory: Cognition and memory normal.        Judgment: Judgment normal.    Assessment  Plan  Annual physical exam See below   Chronic deep vein thrombosis (DVT) of calf muscle vein of right lower extremity (HCC) Heterozygous factor V Leiden mutation (HCC) On xarelto 20 mg qd  Est heme/onc  Frequent falls Let me know when ready pt/Ot  Had rollator at home walking with cane  Handicap sticker today   Open wound left lower leg  F[facility]u [facility]   Gastroesophageal reflux disease without esophagitis - Plan: famotidine (PEPCID) 20 MG tablet  Dexilant   HM/annual Declines flu shot  Tdap utd  Pfizer 4/4 shingrix 2/2  Consider hep B vaccine in future NR 08/31/15  -disc new Hep B vaccine in the future x 2 doses    HCV neg 08/31/15     Pap neg 09/11/16 neg neg HPV  Will f/u Dr. Leafy Ro has IUD checked 06/28/20 and pap neg neg HPV   Mammogram 08/12/19 ob/gyn ordered Dr. Leafy Ro negative sch 08/16/20 and negative    dexa 01/04/20 normal    Colonoscopy utd had 02/2020 f/u in 10 years with EGD Dr. Allen Norris    Dermatology. [facility] due to see 09/28/21   Never smoker   Rec healthy diet and exercise   Other providers Former PCP [facility] Dr. Garen Grams then [facility] requested all imaging Xrays, MRIS, CT Pain Dr. Alvira Monday in [place] specialist Dr. Rennis Harding ortho spine in [place] [facility]-Dr. Tami Ribas stated pt did not have allergies of note had testing Psychiatry and therapy-following  PT [facility] Dr. Tyler Pita, Dr. Lonia Blood [facility]  Ortho spine Dr. Rennis Harding  Ortho Dr .[name] [facility] [place] [facility]   Of note meds pt gets from PCP  Valtrex  adipex  Macrobid  Robaxin  voltaren gel    04/14/19 ENT Dr. Tami Ribas sinusitis deviated nasal septum right nasal endoscopy and left nasacort 55 nasal hoarse voice small hemorrhage right cord 2/2 coughing and inhalers f/u in 6 weeks after shoulder  surgery      Provider: Dr. Olivia Mackie McLean-Scocuzza-Internal Medicine

## 2021-07-19 ENCOUNTER — Encounter: Payer: Self-pay | Admitting: Internal Medicine

## 2021-07-20 NOTE — Telephone Encounter (Signed)
For your information  

## 2021-07-20 NOTE — Telephone Encounter (Signed)
Please advise 

## 2021-07-25 ENCOUNTER — Encounter: Payer: Self-pay | Admitting: Internal Medicine

## 2021-07-25 NOTE — Telephone Encounter (Signed)
Please advise 

## 2021-07-26 ENCOUNTER — Telehealth: Payer: Self-pay

## 2021-07-26 NOTE — Telephone Encounter (Signed)
Pt has upcoming appt 08/01/21 with you...   Pt would like to know if there is anything she can do in the meantime  Pt reports ongoing N/V--- watery, green mucus, 1 episode of yellow colored vomit.... Pt denies diarrhea but does report constipation.... Pt has tried Dexilant, Omeprazole, Pepcid with no relief, and tried Pepto bismol with some relief.   Please advise

## 2021-07-27 NOTE — Telephone Encounter (Signed)
I spoke to pt and she is aware as instructed and expressed understanding... Pt reports some minimal improvement in Sx since we last spoke.... ED precautions given

## 2021-08-01 ENCOUNTER — Ambulatory Visit (INDEPENDENT_AMBULATORY_CARE_PROVIDER_SITE_OTHER): Payer: 59 | Admitting: Gastroenterology

## 2021-08-01 ENCOUNTER — Encounter: Payer: Self-pay | Admitting: Gastroenterology

## 2021-08-01 VITALS — BP 134/83 | HR 68 | Temp 98.6°F | Wt 206.0 lb

## 2021-08-01 DIAGNOSIS — K219 Gastro-esophageal reflux disease without esophagitis: Secondary | ICD-10-CM

## 2021-08-01 DIAGNOSIS — R112 Nausea with vomiting, unspecified: Secondary | ICD-10-CM

## 2021-08-01 MED ORDER — ONDANSETRON 8 MG PO TBDP: 8.0000 mg | ORAL_TABLET | Freq: Three times a day (TID) | ORAL | 2 refills | Status: DC | PRN

## 2021-08-01 NOTE — Progress Notes (Signed)
Primary Care Physician: McLean-Scocuzza, Nino Glow, MD  Primary Gastroenterologist:  Dr. [name]  Chief Complaint  Patient presents with   Nausea    Ongoing and has vomited... Pt has been taking Zofran up to 4 times weekly and Pepto with some relief... Pt reports some improvement since last week    HPI: Maureen Randall is a 56 y.o. female here who comes in today with a history of nausea with vomiting.  The patient reports that her vomiting is better but she continues to have nausea. She also reports that she has been taking Zofran 8 mg for her symptoms.  She also has been taking Pepto-Bismol and states that she took an entire bottle.  She was taking the Linzess 145 g per day but states that it gave her diarrhea but the 72 g did not work. She does endorse that her life has become more stressful with her mother and her daughter.  She denies any lack stools or bloody stools and denies any hematemesis.  Past Medical History:  Diagnosis Date   Anxiety    Arthritis    osteoarthritis-joints, feet, shoulder   Asthma    previously diagnosed after a bout of bronchitis/ no problems since   Bursitis    b/l hips   Chronic pain syndrome    COPD (chronic obstructive pulmonary disease) ([place]) 2004   patient states probably bronchitis   Depression    DVT (deep venous thrombosis) ([place]) 2012   right leg. after surgery, while on BCP   GERD (gastroesophageal reflux disease)    Hyperlipidemia    Left leg DVT ([place])    fem/pop 12/07/20 after right shoulder surgery 11/30/20   Neuropathy    post surgery   PVD (peripheral vascular disease) (HCC)    Spondylolisthesis     Current Outpatient Medications  Medication Sig Dispense Refill   acetaminophen (TYLENOL) 650 MG CR tablet Take 1,300 mg by mouth every 8 (eight) hours as needed for pain.     baclofen (LIORESAL) 10 MG tablet Take 10 mg by mouth 3 (three) times daily as needed for muscle spasms.     BELBUCA 450 MCG FILM Take 450 mcg by mouth every  12 (twelve) hours as needed (pain).     Buprenorphine HCl (BELBUCA) 450 MCG FILM Place inside cheek 2 (two) times daily as needed.     cetirizine (ZYRTEC) 10 MG tablet Take 10 mg by mouth daily as needed for allergies.     dexlansoprazole (DEXILANT) 60 MG capsule Take 1 capsule (60 mg total) by mouth daily. (Patient taking differently: Take 60 mg by mouth at bedtime.) 90 capsule 1   diclofenac Sodium (VOLTAREN) 1 % GEL Apply 2-4 g topically 4 (four) times daily. 2 gram upper body qid prn and 4 gram lower body qid prn (Patient taking differently: Apply 2-4 g topically 4 (four) times daily as needed (pain).) 100 g 11   DULoxetine (CYMBALTA) 60 MG capsule TAKE 1 CAPSULE BY MOUTH EVERY DAY 90 capsule 1   Elastic Bandages & Supports (RELIEF KNEE) MISC Apply in the morning and remove at night.     estradiol (ESTRACE) 0.1 MG/GM vaginal cream Place 1 Applicatorful vaginally every 14 (fourteen) days.     famotidine (PEPCID) 20 MG tablet TAKE 1 TABLET BY MOUTH EVERYDAY AT BEDTIME 90 tablet 3   hydrOXYzine (ATARAX/VISTARIL) 10 MG tablet Take 1 tablet (10 mg total) by mouth daily as needed for anxiety. 90 tablet 1   linaclotide (LINZESS) 145  MCG CAPS capsule Take 1 capsule (145 mcg total) by mouth daily before breakfast. 30 capsule 0   ondansetron (ZOFRAN) 8 MG tablet TAKE 1 TABLET BY MOUTH EVERY 8 HOURS AS NEEDED FOR NAUSEA OR VOMITING. 18 tablet 6   oxyCODONE-acetaminophen (PERCOCET) 10-325 MG tablet Take 1 tablet by mouth every 6 (six) hours as needed for pain.     phentermine (ADIPEX-P) 37.5 MG tablet Take 1 tablet (37.5 mg total) by mouth daily before breakfast. 60 tablet 0   pregabalin (LYRICA) 50 MG capsule Take 100 mg by mouth 2 (two) times daily.     Red Yeast Rice Extract (RED YEAST RICE PO) Take 1,200 mg by mouth daily.     rivaroxaban (XARELTO) 20 MG TABS tablet Take 1 tablet by mouth daily.     traZODone (DESYREL) 100 MG tablet Take 2 tablets (200 mg total) by mouth at bedtime as needed for sleep.  180 tablet 1   valACYclovir (VALTREX) 500 MG tablet Take 500 mg by mouth daily as needed (outbreaks).     No current facility-administered medications for this visit.    Allergies as of 08/01/2021 - Review Complete 08/01/2021  Allergen Reaction Noted   Amoxicillin Hives 06/14/2018   Septra [sulfamethoxazole-trimethoprim] Hives 01/29/2018   Sulfamethoxazole  04/24/2021   Trimethoprim  04/24/2021   Cephalexin Rash 01/29/2018    ROS:  General: Negative for anorexia, weight loss, fever, chills, fatigue, weakness. ENT: Negative for hoarseness, difficulty swallowing , nasal congestion. CV: Negative for chest pain, angina, palpitations, dyspnea on exertion, peripheral edema.  Respiratory: Negative for dyspnea at rest, dyspnea on exertion, cough, sputum, wheezing.  GI: See history of present illness. GU:  Negative for dysuria, hematuria, urinary incontinence, urinary frequency, nocturnal urination.  Endo: Negative for unusual weight change.    Physical Examination:   BP 134/83    Pulse 68    Temp 98.6 F (37 C) (Oral)    Wt 206 lb (93.4 kg)    BMI 32.26 kg/m   General: Well-nourished, well-developed in no acute distress.  Eyes: No icterus. Conjunctivae pink. Lungs: Clear to auscultation bilaterally. Non-labored. Heart: Regular rate and rhythm, no murmurs rubs or gallops.  Abdomen: Bowel sounds are normal, nontender, nondistended, no hepatosplenomegaly or masses, no abdominal bruits or hernia , no rebound or guarding.   Extremities: No lower extremity edema. No clubbing or deformities. Neuro: Alert and oriented x 3.  Grossly intact. Skin: Warm and dry, no jaundice.   Psych: Alert and cooperative, normal mood and affect.  Labs:    Imaging Studies: No results found.  Assessment and Plan:   Maureen Randall is a 56 y.o. y/o female who comes in today with a history of nausea with vomiting that has improved and has slowly gotten better.  The patient states she is been under more  stress.  She is not having any further reflux symptoms and states that she is taking her Dexilant approximate 5:00 at night and supplemented with a H2 blocker before she goes to sleep.  She has been told to raise the head of her bed and to not eat for 4 hours before she goes to sleep.  The patient has also been given a prescription of Zofran to be taken sublingually since she reports that has worked better for her. Her symptoms may be related to the increased stress.  She has also been told that a probiotic may help with her bowel movements.  She has been given samples of Trulance 3 mg because  of the Linzess not working well for her. If this works for her then she will contact my office for a prescription.  The patient has been explained the plan and agrees with it.     [name], MD. Marval Regal    Note: This dictation was prepared with Dragon dictation along with smaller phrase technology. Any transcriptional errors that result from this process are unintentional.

## 2021-08-02 ENCOUNTER — Telehealth: Payer: Self-pay

## 2021-08-02 ENCOUNTER — Encounter: Payer: Self-pay | Admitting: Gastroenterology

## 2021-08-02 DIAGNOSIS — M5030 Other cervical disc degeneration, unspecified cervical region: Secondary | ICD-10-CM | POA: Insufficient documentation

## 2021-08-02 NOTE — Telephone Encounter (Signed)
PA has been submitted to [facility] via [email or web address]... waiting on response

## 2021-08-03 NOTE — Telephone Encounter (Signed)
The request was denied because you do not have Hyperemesis Gravidarum (A severe type of nausea and vomiting during pregnancy), are not receiving radiation therapy, or are not receiving moderate to highly emetogenic chemotherapy.

## 2021-08-03 NOTE — Addendum Note (Signed)
Addended by: Orland Mustard on: 08/03/2021 08:48 AM   Modules accepted: Orders

## 2021-08-04 ENCOUNTER — Other Ambulatory Visit: Payer: Self-pay

## 2021-08-04 ENCOUNTER — Encounter: Payer: 59 | Attending: Physician Assistant | Admitting: Physician Assistant

## 2021-08-04 DIAGNOSIS — Z7901 Long term (current) use of anticoagulants: Secondary | ICD-10-CM | POA: Insufficient documentation

## 2021-08-04 DIAGNOSIS — Z86718 Personal history of other venous thrombosis and embolism: Secondary | ICD-10-CM | POA: Diagnosis not present

## 2021-08-04 DIAGNOSIS — G8929 Other chronic pain: Secondary | ICD-10-CM | POA: Diagnosis not present

## 2021-08-04 DIAGNOSIS — L97822 Non-pressure chronic ulcer of other part of left lower leg with fat layer exposed: Secondary | ICD-10-CM | POA: Insufficient documentation

## 2021-08-04 NOTE — Progress Notes (Signed)
Maureen Randall ([id number]) Visit Report for 08/04/2021 Chief Complaint Document Details Patient Name: Maureen Randall Date of Service: 08/04/2021 8:45 AM Medical Record Number: [id number] Patient Account Number: [id number] Date of Birth/Sex: 16-Sep-1965 (56 y.o. F) Treating RN: Carlene Coria Primary Care Provider: McLean-Scocuzza, Olivia Mackie Other Clinician: Referring Provider: McLean-Scocuzza, Olivia Mackie Treating Provider/Extender: Skipper Cliche in Treatment: 0 Information Obtained from: Patient Chief Complaint Left lateral LE Surgical Ulcer Electronic Signature(s) Signed: 08/04/2021 9:40:57 AM By: Worthy Keeler PA-C Entered By: Worthy Keeler on 08/04/2021 09:40:57 Maureen Randall ([id number]) -------------------------------------------------------------------------------- Debridement Details Patient Name: Maureen Randall Date of Service: 08/04/2021 8:45 AM Medical Record Number: [id number] Patient Account Number: [id number] Date of Birth/Sex: Jul 01, 1966 (56 y.o. F) Treating RN: Carlene Coria Primary Care Provider: McLean-Scocuzza, Olivia Mackie Other Clinician: Referring Provider: McLean-Scocuzza, Olivia Mackie Treating Provider/Extender: Skipper Cliche in Treatment: 0 Debridement Performed for Wound #1 Left,Lateral Lower Leg Assessment: Performed By: Physician Tommie Sams., PA-C Debridement Type: Chemical/Enzymatic/Mechanical Agent Used: Saline and gauze Level of Consciousness (Pre- Awake and Alert procedure): Pre-procedure Verification/Time Out No Taken: Start Time: 09:46 Instrument: Other : saline and gauze Bleeding: Minimum Hemostasis Achieved: Pressure End Time: 09:47 Procedural Pain: 0 Post Procedural Pain: 0 Response to Treatment: Procedure was tolerated well Level of Consciousness (Post- Awake and Alert procedure): Post Debridement Measurements of Total Wound Length: (cm) 2.7 Width: (cm) 0.7 Depth: (cm) 0.1 Volume: (cm) 0.148 Character of Wound/Ulcer Post  Debridement: Improved Post Procedure Diagnosis Same as Pre-procedure Electronic Signature(s) Signed: 08/04/2021 2:31:10 PM By: Carlene Coria RN Signed: 08/04/2021 4:30:05 PM By: Worthy Keeler PA-C Entered By: Carlene Coria on 08/04/2021 09:48:01 Maureen Randall ([id number]) -------------------------------------------------------------------------------- HPI Details Patient Name: Maureen Randall Date of Service: 08/04/2021 8:45 AM Medical Record Number: [id number] Patient Account Number: [id number] Date of Birth/Sex: 19-Jan-1966 (56 y.o. F) Treating RN: Carlene Coria Primary Care Provider: McLean-Scocuzza, Olivia Mackie Other Clinician: Referring Provider: McLean-Scocuzza, Olivia Mackie Treating Provider/Extender: Skipper Cliche in Treatment: 0 History of Present Illness HPI Description: 08/04/2021 patient presents today for initial inspection here in our clinic concerning a DC hist surgical wound over the left lower leg close to the ankle region which actually originally occurred on 08 May 2021. This was a surgery in regard to the peroneal nerve in order to diminish pain in the ankle and foot region from what I am understanding. Nonetheless the patient had a unfortunate course following the surgery I went through the pictures she actually had a very good chronology and pictures for me to review which did actually enlightened a lot of what was going on during the time. It appears that she initially appeared to be healing and then had an area of necrotic tissue that developed and then slowly this began to soften it became more slough as opposed to eschar and then actually had a fairly deep hole in the center part. My suspicion is that she either had a seroma or else a hematoma that formed underneath causing some necrotic damage to the underlying structure. Nonetheless following the pictures it does appear that this actually healed pretty well and started to fill in and overall was pretty good even up to a  week ago. However then she did put some DuoDERM over top of it at their suggestion she has been going to the [facility] and subsequently this caused it to break open as it can get rolled up and bunched up causing some damage here. Otherwise the patient's wound does not appear to be showing  any signs of infection and today it actually looks pretty good. She does question why there is such a dark discoloration where the scar areas and I did give her some information on that as well as far as what happens sometimes when scars form. Nonetheless overall I really feel like that she is not doing too poorly and I am hopeful this is pretty much about healed for her. She does have a history of long-term use of anticoagulants due to a deep vein thrombosis which has actually resolved currently that was actually just reevaluated this week and was negative finally. Hopefully she will be off of the blood thinner soon. Otherwise she has no other major medical problems other than some chronic pain issues. Electronic Signature(s) Signed: 08/04/2021 3:30:07 PM By: Worthy Keeler PA-C Entered By: Worthy Keeler on 08/04/2021 15:30:06 Maureen Randall ([id number]) -------------------------------------------------------------------------------- Physical Exam Details Patient Name: Maureen Randall Date of Service: 08/04/2021 8:45 AM Medical Record Number: [id number] Patient Account Number: [id number] Date of Birth/Sex: [birth year] (56 y.o. F) Treating RN: Carlene Coria Primary Care Provider: McLean-Scocuzza, Olivia Mackie Other Clinician: Referring Provider: McLean-Scocuzza, Olivia Mackie Treating Provider/Extender: Skipper Cliche in Treatment: 0 Constitutional sitting or standing blood pressure is within target range for patient.. pulse regular and within target range for patient.[name] respirations regular, non- labored and within target range for patient.[name] temperature within target range for patient.. Well-nourished and  well-hydrated in no acute distress. Eyes conjunctiva clear no eyelid edema noted. pupils equal round and reactive to light and accommodation. Ears, Nose, Mouth, and Throat no gross abnormality of ear auricles or external auditory canals. normal hearing noted during conversation. mucus membranes moist. Respiratory normal breathing without difficulty. Cardiovascular 2+ dorsalis pedis/posterior tibialis pulses. no clubbing, cyanosis, significant edema, <3 sec cap refill. Musculoskeletal normal gait and posture. no significant deformity or arthritic changes, no loss or range of motion, no clubbing. Psychiatric this patient is able to make decisions and demonstrates good insight into disease process. Alert and Oriented x 3. pleasant and cooperative. Notes Upon inspection patient's wound bed actually showed signs of actually pretty good granulation and epithelization at this point. I do not see any signs of active infection locally nor systemically and overall I am actually very pleased with where we stand. I think this is very close to resolution and I do believe something like a Xeroform gauze dressing would do quite well for her at this point. No debridement was even necessary. Electronic Signature(s) Signed: 08/04/2021 3:30:32 PM By: Worthy Keeler PA-C Entered By: Worthy Keeler on 08/04/2021 15:30:32 Maureen Randall ([id number]) -------------------------------------------------------------------------------- Physician Orders Details Patient Name: Maureen Randall Date of Service: 08/04/2021 8:45 AM Medical Record Number: [id number] Patient Account Number: [id number] Date of Birth/Sex: 1965-12-29 (56 y.o. F) Treating RN: Carlene Coria Primary Care Provider: McLean-Scocuzza, Olivia Mackie Other Clinician: Referring Provider: McLean-Scocuzza, Olivia Mackie Treating Provider/Extender: Skipper Cliche in Treatment: 0 Verbal / Phone Orders: No Diagnosis Coding ICD-10 Coding Code Description T81.31XA  Disruption of external operation (surgical) wound, not elsewhere classified, initial encounter L97.822 Non-pressure chronic ulcer of other part of left lower leg with fat layer exposed Z86.718 Personal history of other venous thrombosis and embolism Z79.01 Long term (current) use of anticoagulants Follow-up Appointments o Return Appointment in 1 week. Bathing/ Shower/ Hygiene o May shower; gently cleanse wound with antibacterial soap, rinse and pat dry prior to dressing wounds o No tub bath. Edema Control - Lymphedema / Segmental Compressive Device / Other o Elevate, Exercise Daily and Avoid  Standing for Long Periods of Time. o Elevate legs to the level of the heart and pump ankles as often as possible o Elevate leg(s) parallel to the floor when sitting. Wound Treatment Wound #1 - Lower Leg Wound Laterality: Left, Lateral Primary Dressing: Xeroform 4x4-HBD (in/in) (DME) (Generic) 1 x Per Day/30 Days Discharge Instructions: Apply Xeroform 4x4-HBD (in/in) as directed Secondary Dressing: Zetuvit Plus Silicone Border Dressing 4x4 (in/in) (DME) (Generic) 1 x Per Day/30 Days Electronic Signature(s) Signed: 08/04/2021 2:31:10 PM By: Carlene Coria RN Signed: 08/04/2021 4:30:05 PM By: Worthy Keeler PA-C Entered By: Carlene Coria on 08/04/2021 09:51:34 Maureen Randall ([id number]) -------------------------------------------------------------------------------- Problem List Details Patient Name: Maureen Randall Date of Service: 08/04/2021 8:45 AM Medical Record Number: [id number] Patient Account Number: [id number] Date of Birth/Sex: [birth year] (56 y.o. F) Treating RN: Carlene Coria Primary Care Provider: McLean-Scocuzza, Olivia Mackie Other Clinician: Referring Provider: McLean-Scocuzza, Olivia Mackie Treating Provider/Extender: Skipper Cliche in Treatment: 0 Active Problems ICD-10 Encounter Code Description Active Date MDM Diagnosis T81.31XA Disruption of external operation (surgical) wound,  not elsewhere 08/04/2021 No Yes classified, initial encounter L97.822 Non-pressure chronic ulcer of other part of left lower leg with fat layer 08/04/2021 No Yes exposed Z86.718 Personal history of other venous thrombosis and embolism 08/04/2021 No Yes Z79.01 Long term (current) use of anticoagulants 08/04/2021 No Yes Inactive Problems Resolved Problems Electronic Signature(s) Signed: 08/04/2021 9:40:32 AM By: Worthy Keeler PA-C Entered By: Worthy Keeler on 08/04/2021 09:40:32 Maureen Randall ([id number]) -------------------------------------------------------------------------------- Progress Note Details Patient Name: Maureen Randall Date of Service: 08/04/2021 8:45 AM Medical Record Number: [id number] Patient Account Number: [id number] Date of Birth/Sex: 03/09/1966 (56 y.o. F) Treating RN: Carlene Coria Primary Care Provider: McLean-Scocuzza, Olivia Mackie Other Clinician: Referring Provider: McLean-Scocuzza, Olivia Mackie Treating Provider/Extender: Skipper Cliche in Treatment: 0 Subjective Chief Complaint Information obtained from Patient Left lateral LE Surgical Ulcer History of Present Illness (HPI) 08/04/2021 patient presents today for initial inspection here in our clinic concerning a DC hist surgical wound over the left lower leg close to the ankle region which actually originally occurred on 08 May 2021. This was a surgery in regard to the peroneal nerve in order to diminish pain in the ankle and foot region from what I am understanding. Nonetheless the patient had a unfortunate course following the surgery I went through the pictures she actually had a very good chronology and pictures for me to review which did actually enlightened a lot of what was going on during the time. It appears that she initially appeared to be healing and then had an area of necrotic tissue that developed and then slowly this began to soften it became more slough as opposed to eschar and then actually had a  fairly deep hole in the center part. My suspicion is that she either had a seroma or else a hematoma that formed underneath causing some necrotic damage to the underlying structure. Nonetheless following the pictures it does appear that this actually healed pretty well and started to fill in and overall was pretty good even up to a week ago. However then she did put some DuoDERM over top of it at their suggestion she has been going to the [facility] and subsequently this caused it to break open as it can get rolled up and bunched up causing some damage here. Otherwise the patient's wound does not appear to be showing any signs of infection and today it actually looks pretty good. She does question why there is such  a dark discoloration where the scar areas and I did give her some information on that as well as far as what happens sometimes when scars form. Nonetheless overall I really feel like that she is not doing too poorly and I am hopeful this is pretty much about healed for her. She does have a history of long-term use of anticoagulants due to a deep vein thrombosis which has actually resolved currently that was actually just reevaluated this week and was negative finally. Hopefully she will be off of the blood thinner soon. Otherwise she has no other major medical problems other than some chronic pain issues. Patient History Allergies amoxicillin, Sulfa (Sulfonamide Antibiotics), cephalexin Social History Never smoker, Marital Status - Married, Alcohol Use - Never, Drug Use - No History, Caffeine Use - Moderate. Objective Constitutional sitting or standing blood pressure is within target range for patient.. pulse regular and within target range for patient.[name] respirations regular, non- labored and within target range for patient.[name] temperature within target range for patient.. Well-nourished and well-hydrated in no acute distress. Vitals Time Taken: 9:07 AM, Height: 67 in,  Source: Stated, Weight: 206 lbs, Source: Stated, BMI: 32.3, Temperature: 98.4 F, Pulse: 82 bpm, Respiratory Rate: 18 breaths/min, Blood Pressure: 115/78 mmHg. Eyes conjunctiva clear no eyelid edema noted. pupils equal round and reactive to light and accommodation. Ears, Nose, Mouth, and Throat no gross abnormality of ear auricles or external auditory canals. normal hearing noted during conversation. mucus membranes moist. Respiratory normal breathing without difficulty. Cardiovascular 2+ dorsalis pedis/posterior tibialis pulses. no clubbing, cyanosis, significant edema, Keenum, Abygale A. ([id number]) Musculoskeletal normal gait and posture. no significant deformity or arthritic changes, no loss or range of motion, no clubbing. Psychiatric this patient is able to make decisions and demonstrates good insight into disease process. Alert and Oriented x 3. pleasant and cooperative. General Notes: Upon inspection patient's wound bed actually showed signs of actually pretty good granulation and epithelization at this point. I do not see any signs of active infection locally nor systemically and overall I am actually very pleased with where we stand. I think this is very close to resolution and I do believe something like a Xeroform gauze dressing would do quite well for her at this point. No debridement was even necessary. Integumentary (Hair, Skin) Wound #1 status is Open. Original cause of wound was Surgical Injury. The date acquired was: 05/08/2021. The wound is located on the Left,Lateral Lower Leg. The wound measures 2.7cm length x 0.7cm width x 0.1cm depth; 1.484cm^2 area and 0.148cm^3 volume. There is Fat Layer (Subcutaneous Tissue) exposed. There is no tunneling or undermining noted. There is a medium amount of serosanguineous drainage noted. There is large (67-100%) red granulation within the wound bed. There is no necrotic tissue within the wound bed. Assessment Active  Problems ICD-10 Disruption of external operation (surgical) wound, not elsewhere classified, initial encounter Non-pressure chronic ulcer of other part of left lower leg with fat layer exposed Personal history of other venous thrombosis and embolism Long term (current) use of anticoagulants Procedures Wound #1 Pre-procedure diagnosis of Wound #1 is a Dehisced Wound located on the Left,Lateral Lower Leg . There was a Chemical/Enzymatic/Mechanical debridement performed by Tommie Sams., PA-C. With the following instrument(s): saline and gauze. Other agent used was Saline and gauze. A Minimum amount of bleeding was controlled with Pressure. The procedure was tolerated well with a pain level of 0 throughout and a pain level of 0 following the procedure. Post Debridement Measurements: 2.7cm length x  0.7cm width x 0.1cm depth; 0.148cm^3 volume. Character of Wound/Ulcer Post Debridement is improved. Post procedure Diagnosis Wound #1: Same as Pre-Procedure Plan Follow-up Appointments: Return Appointment in 1 week. Bathing/ Shower/ Hygiene: May shower; gently cleanse wound with antibacterial soap, rinse and pat dry prior to dressing wounds No tub bath. Edema Control - Lymphedema / Segmental Compressive Device / Other: Elevate, Exercise Daily and Avoid Standing for Long Periods of Time. Elevate legs to the level of the heart and pump ankles as often as possible Elevate leg(s) parallel to the floor when sitting. WOUND #1: - Lower Leg Wound Laterality: Left, Lateral Primary Dressing: Xeroform 4x4-HBD (in/in) (DME) (Generic) 1 x Per Day/30 Days Discharge Instructions: Apply Xeroform 4x4-HBD (in/in) as directed Secondary Dressing: Zetuvit Plus Silicone Border Dressing 4x4 (in/in) (DME) (Generic) 1 x Per Day/30 Days 1. We will continue to monitor and make sure that nothing worsens as far as anything getting deeper or worsening overall yet again. Nonetheless I feel like she is probably out of the  worst part of this and I am seeing her at a time where things are doing quite well. 2. I am good recommend that we use Xeroform gauze as the dressing of choice currently and we will see how this does. 3. We use a Zetuvit border foam dressing to cover. NOLLIE, SHIFLETT A. ([id number]) 3. I would also recommend that she elevate her legs if she notices any swelling that right now I do not see any obvious swelling signs at this point. We will see patient back for reevaluation in 2 weeks here in the clinic. If anything worsens or changes patient will contact our office for additional recommendations. Electronic Signature(s) Signed: 08/04/2021 3:31:17 PM By: Worthy Keeler PA-C Entered By: Worthy Keeler on 08/04/2021 15:31:17 Maureen Randall ([id number]) -------------------------------------------------------------------------------- ROS/PFSH Details Patient Name: Maureen Randall Date of Service: 08/04/2021 8:45 AM Medical Record Number: [id number] Patient Account Number: [id number] Date of Birth/Sex: Jul 25, 1965 (56 y.o. F) Treating RN: Carlene Coria Primary Care Provider: McLean-Scocuzza, Olivia Mackie Other Clinician: Referring Provider: McLean-Scocuzza, Olivia Mackie Treating Provider/Extender: Skipper Cliche in Treatment: 0 Immunizations Pneumococcal Vaccine: Received Pneumococcal Vaccination: No Implantable Devices No devices added Family and Social History Never smoker; Marital Status - Married; Alcohol Use: Never; Drug Use: No History; Caffeine Use: Moderate; Financial Concerns: No; Food, Clothing or Shelter Needs: No; Support System Lacking: No; Transportation Concerns: No Electronic Signature(s) Signed: 08/04/2021 2:31:10 PM By: Carlene Coria RN Signed: 08/04/2021 4:30:05 PM By: Worthy Keeler PA-C Entered By: Carlene Coria on 08/04/2021 09:09:54 Maureen Randall ([id number]) -------------------------------------------------------------------------------- SuperBill Details Patient Name: Maureen Randall Date of Service: 08/04/2021 Medical Record Number: [id number] Patient Account Number: [id number] Date of Birth/Sex: 06-06-1966 (56 y.o. F) Treating RN: Carlene Coria Primary Care Provider: McLean-Scocuzza, Olivia Mackie Other Clinician: Referring Provider: McLean-Scocuzza, Olivia Mackie Treating Provider/Extender: Skipper Cliche in Treatment: 0 Diagnosis Coding ICD-10 Codes Code Description T81.31XA Disruption of external operation (surgical) wound, not elsewhere classified, initial encounter L97.822 Non-pressure chronic ulcer of other part of left lower leg with fat layer exposed Z86.718 Personal history of other venous thrombosis and embolism Z79.01 Long term (current) use of anticoagulants Facility Procedures CPT4 Code: [id number] Description: 99213 - WOUND CARE VISIT-LEV 3 EST PT Modifier: Quantity: 1 Physician Procedures CPT4 Code: 9242683 Description: 99204 - WC PHYS LEVEL 4 - NEW PT Modifier: Quantity: 1 CPT4 Code: Description: ICD-10 Diagnosis Description T81.31XA Disruption of external operation (surgical) wound, not elsewhere classifi M19.622 Non-pressure chronic ulcer of other part of left lower  leg with fat layer Z86.718 Personal history of other venous  thrombosis and embolism Z79.01 Long term (current) use of anticoagulants Modifier: ed, initial encounter exposed Quantity: Electronic Signature(s) Signed: 08/04/2021 3:31:37 PM By: Worthy Keeler PA-C Previous Signature: 08/04/2021 2:31:10 PM Version By: Carlene Coria RN Entered By: Worthy Keeler on 08/04/2021 15:31:37

## 2021-08-04 NOTE — Progress Notes (Signed)
Maureen Randall, Maureen Randall ([id number]) Visit Report for 08/04/2021 Allergy List Details Patient Name: Maureen Randall, Maureen Randall Date of Service: 08/04/2021 8:45 AM Medical Record Number: [id number] Patient Account Number: [id number] Date of Birth/Sex: 11-11-1965 (56 y.o. F) Treating RN: Carlene Coria Primary Care Marti Mclane: McLean-Scocuzza, Olivia Mackie Other Clinician: Referring Saidee Geremia: McLean-Scocuzza, Olivia Mackie Treating Macari Zalesky/Extender: Skipper Cliche in Treatment: 0 Allergies Active Allergies amoxicillin Sulfa (Sulfonamide Antibiotics) cephalexin Allergy Notes Electronic Signature(s) Signed: 08/04/2021 2:31:10 PM By: Carlene Coria RN Entered By: Carlene Coria on 08/04/2021 78:46:96 Maureen Randall ([id number]) -------------------------------------------------------------------------------- Arrival Information Details Patient Name: Maureen Randall Date of Service: 08/04/2021 8:45 AM Medical Record Number: [id number] Patient Account Number: [id number] Date of Birth/Sex: Oct 04, 1965 (56 y.o. F) Treating RN: Carlene Coria Primary Care Florabelle Cardin: McLean-Scocuzza, Olivia Mackie Other Clinician: Referring Adrina Armijo: McLean-Scocuzza, Olivia Mackie Treating Josilynn Losh/Extender: Skipper Cliche in Treatment: 0 Visit Information Patient Arrived: Ambulatory Arrival Time: 09:02 Accompanied By: self Transfer Assistance: None Patient Identification Verified: Yes Secondary Verification Process Completed: Yes Patient Requires Transmission-Based No Precautions: Patient Has Alerts: Yes Patient Alerts: Patient on Blood Thinner Electronic Signature(s) Signed: 08/04/2021 2:31:10 PM By: Carlene Coria RN Entered By: Carlene Coria on 08/04/2021 09:03:58 Maureen Randall ([id number]) -------------------------------------------------------------------------------- Clinic Level of Care Assessment Details Patient Name: Maureen Randall Date of Service: 08/04/2021 8:45 AM Medical Record Number: [id number] Patient Account Number: [id number] Date of  Birth/Sex: 09/09/1965 (56 y.o. F) Treating RN: Carlene Coria Primary Care Kenzey Birkland: McLean-Scocuzza, Olivia Mackie Other Clinician: Referring Laquanda Bick: McLean-Scocuzza, Olivia Mackie Treating Reigan Tolliver/Extender: Skipper Cliche in Treatment: 0 Clinic Level of Care Assessment Items TOOL 2 Quantity Score X - Use when only an EandM is performed on the INITIAL visit 1 0 ASSESSMENTS - Nursing Assessment / Reassessment X - General Physical Exam (combine w/ comprehensive assessment (listed just below) when performed on new 1 20 pt. evals) X- 1 25 Comprehensive Assessment (HX, ROS, Risk Assessments, Wounds Hx, etc.) ASSESSMENTS - Wound and Skin Assessment / Reassessment X - Simple Wound Assessment / Reassessment - one wound 1 5 []  - 0 Complex Wound Assessment / Reassessment - multiple wounds []  - 0 Dermatologic / Skin Assessment (not related to wound area) ASSESSMENTS - Ostomy and/or Continence Assessment and Care []  - Incontinence Assessment and Management 0 []  - 0 Ostomy Care Assessment and Management (repouching, etc.) PROCESS - Coordination of Care X - Simple Patient / Family Education for ongoing care 1 15 []  - 0 Complex (extensive) Patient / Family Education for ongoing care []  - 0 Staff obtains Programmer, systems, Records, Test Results / Process Orders []  - 0 Staff telephones HHA, Nursing Homes / Clarify orders / etc []  - 0 Routine Transfer to another Facility (non-emergent condition) []  - 0 Routine Hospital Admission (non-emergent condition) []  - 0 New Admissions / Biomedical engineer / Ordering NPWT, Apligraf, etc. []  - 0 Emergency Hospital Admission (emergent condition) X- 1 10 Simple Discharge Coordination []  - 0 Complex (extensive) Discharge Coordination PROCESS - Special Needs []  - Pediatric / Minor Patient Management 0 []  - 0 Isolation Patient Management []  - 0 Hearing / Language / Visual special needs []  - 0 Assessment of Community assistance (transportation, D/C planning,  etc.) []  - 0 Additional assistance / Altered mentation []  - 0 Support Surface(s) Assessment (bed, cushion, seat, etc.) INTERVENTIONS - Wound Cleansing / Measurement X - Wound Imaging (photographs - any number of wounds) 1 5 []  - 0 Wound Tracing (instead of photographs) X- 1 5 Simple Wound Measurement - one wound []  - 0 Complex Wound Measurement - multiple wounds Colclough,  Elna A. ([id number]) X- 1 5 Simple Wound Cleansing - one wound []  - 0 Complex Wound Cleansing - multiple wounds INTERVENTIONS - Wound Dressings X - Small Wound Dressing one or multiple wounds 1 10 []  - 0 Medium Wound Dressing one or multiple wounds []  - 0 Large Wound Dressing one or multiple wounds []  - 0 Application of Medications - injection INTERVENTIONS - Miscellaneous []  - External ear exam 0 []  - 0 Specimen Collection (cultures, biopsies, blood, body fluids, etc.) []  - 0 Specimen(s) / Culture(s) sent or taken to Lab for analysis []  - 0 Patient Transfer (multiple staff / Civil Service fast streamer / Similar devices) []  - 0 Simple Staple / Suture removal (25 or less) []  - 0 Complex Staple / Suture removal (26 or more) []  - 0 Hypo / Hyperglycemic Management (close monitor of Blood Glucose) X- 1 15 Ankle / Brachial Index (ABI) - do not check if billed separately Has the patient been seen at the hospital within the last three years: Yes Total Score: 115 Level Of Care: New/Established - Level 3 Electronic Signature(s) Signed: 08/04/2021 2:31:10 PM By: Carlene Coria RN Entered By: Carlene Coria on 08/04/2021 09:51:42 Maureen Randall ([id number]) -------------------------------------------------------------------------------- Encounter Discharge Information Details Patient Name: Maureen Randall Date of Service: 08/04/2021 8:45 AM Medical Record Number: [id number] Patient Account Number: [id number] Date of Birth/Sex: 11-03-1965 (56 y.o. F) Treating RN: Carlene Coria Primary Care Caylon Saine: McLean-Scocuzza, Olivia Mackie Other  Clinician: Referring Jourdyn Ferrin: McLean-Scocuzza, Olivia Mackie Treating Aedyn Mckeon/Extender: Skipper Cliche in Treatment: 0 Encounter Discharge Information Items Post Procedure Vitals Discharge Condition: Stable Temperature (F): 98.4 Ambulatory Status: Ambulatory Pulse (bpm): 82 Discharge Destination: Home Respiratory Rate (breaths/min): 18 Transportation: Private Auto Blood Pressure (mmHg): 115/78 Accompanied By: self Schedule Follow-up Appointment: Yes Clinical Summary of Care: Patient Declined Electronic Signature(s) Signed: 08/04/2021 2:31:10 PM By: Carlene Coria RN Entered By: Carlene Coria on 08/04/2021 09:56:09 Maureen Randall ([id number]) -------------------------------------------------------------------------------- Lower Extremity Assessment Details Patient Name: Maureen Randall Date of Service: 08/04/2021 8:45 AM Medical Record Number: [id number] Patient Account Number: [id number] Date of Birth/Sex: [birth year] (56 y.o. F) Treating RN: Carlene Coria Primary Care Lucetta Baehr: McLean-Scocuzza, Olivia Mackie Other Clinician: Referring Demecia Northway: McLean-Scocuzza, Olivia Mackie Treating Shondrea Steinert/Extender: Skipper Cliche in Treatment: 0 Edema Assessment Assessed: [Left: No] [Right: No] Edema: [Left: N] [Right: o] Calf Left: Right: Point of Measurement: 32 cm From Medial Instep 39 cm Ankle Left: Right: Point of Measurement: 10 cm From Medial Instep 21 cm Knee To Floor Left: Right: From Medial Instep 45 cm Vascular Assessment Pulses: Dorsalis Pedis Palpable: [Left:Yes] Doppler Audible: [Left:Yes] Blood Pressure: Brachial: [Left:115] Ankle: [Left:Dorsalis Pedis: 110 0.96] Electronic Signature(s) Signed: 08/04/2021 2:31:10 PM By: Carlene Coria RN Entered By: Carlene Coria on 08/04/2021 09:20:54 Maureen Randall ([id number]) -------------------------------------------------------------------------------- Multi Wound Chart Details Patient Name: Maureen Randall Date of Service: 08/04/2021 8:45  AM Medical Record Number: [id number] Patient Account Number: [id number] Date of Birth/Sex: 05/20/1966 (56 y.o. F) Treating RN: Carlene Coria Primary Care Yazmine Sorey: McLean-Scocuzza, Olivia Mackie Other Clinician: Referring Jakeim Sedore: McLean-Scocuzza, Olivia Mackie Treating Staphany Ditton/Extender: Skipper Cliche in Treatment: 0 Vital Signs Height(in): 67 Pulse(bpm): 68 Weight(lbs): 206 Blood Pressure(mmHg): 115/78 Body Mass Index(BMI): 32.3 Temperature(F): 98.4 Respiratory Rate(breaths/min): 18 Photos: [N/A:N/A] Wound Location: Left, Lateral Lower Leg N/A N/A Wounding Event: Surgical Injury N/A N/A Primary Etiology: Dehisced Wound N/A N/A Date Acquired: 05/08/2021 N/A N/A Weeks of Treatment: 0 N/A N/A Wound Status: Open N/A N/A Wound Recurrence: No N/A N/A Measurements L x W x D (cm) 2.7x0.7x0.1 N/A N/A Area (cm) : 1.484  N/A N/A Volume (cm) : 0.148 N/A N/A Classification: Full Thickness Without Exposed N/A N/A Support Structures Exudate Amount: Medium N/A N/A Exudate Type: Serosanguineous N/A N/A Exudate Color: red, brown N/A N/A Granulation Amount: Large (67-100%) N/A N/A Granulation Quality: Red N/A N/A Necrotic Amount: None Present (0%) N/A N/A Exposed Structures: Fat Layer (Subcutaneous Tissue): N/A N/A Yes Fascia: No Tendon: No Muscle: No Joint: No Bone: No Epithelialization: None N/A N/A Treatment Notes Electronic Signature(s) Signed: 08/04/2021 2:31:10 PM By: Carlene Coria RN Entered By: Carlene Coria on 08/04/2021 09:43:27 Maureen Randall ([id number]) -------------------------------------------------------------------------------- [place] Details Patient Name: Maureen Randall Date of Service: 08/04/2021 8:45 AM Medical Record Number: [id number] Patient Account Number: [id number] Date of Birth/Sex: 12-02-1965 (56 y.o. F) Treating RN: Carlene Coria Primary Care Kasha Howeth: McLean-Scocuzza, Olivia Mackie Other Clinician: Referring Rafaela Dinius: McLean-Scocuzza,  Olivia Mackie Treating Mordecai Tindol/Extender: Skipper Cliche in Treatment: 0 Active Inactive Wound/Skin Impairment Nursing Diagnoses: Knowledge deficit related to ulceration/compromised skin integrity Goals: Patient/caregiver will verbalize understanding of skin care regimen Date Initiated: 08/04/2021 Target Resolution Date: 09/04/2021 Goal Status: Active Ulcer/skin breakdown will have a volume reduction of 30% by week 4 Date Initiated: 08/04/2021 Target Resolution Date: 10/02/2021 Goal Status: Active Ulcer/skin breakdown will have a volume reduction of 50% by week 8 Date Initiated: 08/04/2021 Target Resolution Date: 11/02/2021 Goal Status: Active Ulcer/skin breakdown will have a volume reduction of 80% by week 12 Date Initiated: 08/04/2021 Target Resolution Date: 12/02/2021 Goal Status: Active Ulcer/skin breakdown will heal within 14 weeks Date Initiated: 08/04/2021 Target Resolution Date: 01/02/2022 Goal Status: Active Interventions: Assess patient/caregiver ability to obtain necessary supplies Assess patient/caregiver ability to perform ulcer/skin care regimen upon admission and as needed Assess ulceration(s) every visit Notes: Electronic Signature(s) Signed: 08/04/2021 2:31:10 PM By: Carlene Coria RN Entered By: Carlene Coria on 08/04/2021 09:43:05 Maureen Randall ([id number]) -------------------------------------------------------------------------------- Pain Assessment Details Patient Name: Maureen Randall Date of Service: 08/04/2021 8:45 AM Medical Record Number: [id number] Patient Account Number: [id number] Date of Birth/Sex: Oct 14, 1965 (56 y.o. F) Treating RN: Carlene Coria Primary Care Reizel Calzada: McLean-Scocuzza, Olivia Mackie Other Clinician: Referring Eugen Jeansonne: McLean-Scocuzza, Olivia Mackie Treating Geraldo Haris/Extender: Skipper Cliche in Treatment: 0 Active Problems Location of Pain Severity and Description of Pain Patient Has Paino Yes Site Locations With Dressing Change: Yes Duration of  the Pain. Constant / Intermittento Intermittent How Long Does it Lasto Hours: Minutes: 15 Rate the pain. Current Pain Level: 2 Worst Pain Level: 4 Least Pain Level: 0 Tolerable Pain Level: 0 Character of Pain Describe the Pain: Aching, Burning Pain Management and Medication Current Pain Management: Medication: Yes Cold Application: No Rest: Yes Massage: No Activity: No T.E.N.S.: No Heat Application: No Leg drop or elevation: No Is the Current Pain Management Adequate: Inadequate How does your wound impact your activities of daily livingo Sleep: No Bathing: No Appetite: No Relationship With Others: No Bladder Continence: No Emotions: No Bowel Continence: No Work: No Toileting: No Drive: No Dressing: No Hobbies: No Electronic Signature(s) Signed: 08/04/2021 2:31:10 PM By: Carlene Coria RN Entered By: Carlene Coria on 08/04/2021 09:07:18 Maureen Randall ([id number]) -------------------------------------------------------------------------------- Patient/Caregiver Education Details Patient Name: Maureen Randall Date of Service: 08/04/2021 8:45 AM Medical Record Number: [id number] Patient Account Number: [id number] Date of Birth/Gender: 1965/11/04 (56 y.o. F) Treating RN: Carlene Coria Primary Care Physician: McLean-Scocuzza, Olivia Mackie Other Clinician: Referring Physician: McLean-Scocuzza, Olivia Mackie Treating Physician/Extender: Skipper Cliche in Treatment: 0 Education Assessment Education Provided To: Patient Education Topics Provided Wound/Skin Impairment: Methods: Explain/Verbal Responses: State content correctly Electronic Signature(s) Signed: 08/04/2021 2:31:10  PM By: Carlene Coria RN Entered By: Carlene Coria on 08/04/2021 09:52:04 Maureen Randall ([id number]) -------------------------------------------------------------------------------- Wound Assessment Details Patient Name: Maureen Randall Date of Service: 08/04/2021 8:45 AM Medical Record Number:  [id number] Patient Account Number: [id number] Date of Birth/Sex: [birth year] (56 y.o. F) Treating RN: Carlene Coria Primary Care Gavyn Ybarra: McLean-Scocuzza, Olivia Mackie Other Clinician: Referring Annakate Soulier: McLean-Scocuzza, Olivia Mackie Treating Adylynn Hertenstein/Extender: Skipper Cliche in Treatment: 0 Wound Status Wound Number: 1 Primary Etiology: Dehisced Wound Wound Location: Left, Lateral Lower Leg Wound Status: Open Wounding Event: Surgical Injury Date Acquired: 05/08/2021 Weeks Of Treatment: 0 Clustered Wound: No Photos Wound Measurements Length: (cm) 2.7 Width: (cm) 0.7 Depth: (cm) 0.1 Area: (cm) 1.484 Volume: (cm) 0.148 % Reduction in Area: % Reduction in Volume: Epithelialization: None Tunneling: No Undermining: No Wound Description Classification: Full Thickness Without Exposed Support Structu Exudate Amount: Medium Exudate Type: Serosanguineous Exudate Color: red, brown res Foul Odor After Cleansing: No Slough/Fibrino No Wound Bed Granulation Amount: Large (67-100%) Exposed Structure Granulation Quality: Red Fascia Exposed: No Necrotic Amount: None Present (0%) Fat Layer (Subcutaneous Tissue) Exposed: Yes Tendon Exposed: No Muscle Exposed: No Joint Exposed: No Bone Exposed: No Treatment Notes Wound #1 (Lower Leg) Wound Laterality: Left, Lateral Cleanser Peri-Wound Care Topical Primary Dressing Marotto, Trini A. ([id number]) Xeroform 4x4-HBD (in/in) Discharge Instruction: Apply Xeroform 4x4-HBD (in/in) as directed Secondary Dressing Zetuvit Plus Silicone Border Dressing 4x4 (in/in) Secured With Compression Wrap Compression Stockings Add-Ons Electronic Signature(s) Signed: 08/04/2021 2:31:10 PM By: Carlene Coria RN Entered By: Carlene Coria on 08/04/2021 09:20:05 Maureen Randall ([id number]) -------------------------------------------------------------------------------- Vitals Details Patient Name: Maureen Randall Date of Service: 08/04/2021 8:45 AM Medical Record  Number: [id number] Patient Account Number: [id number] Date of Birth/Sex: [birth year] (56 y.o. F) Treating RN: Carlene Coria Primary Care Manish Ruggiero: McLean-Scocuzza, Olivia Mackie Other Clinician: Referring Libi Corso: McLean-Scocuzza, Olivia Mackie Treating Roshun Klingensmith/Extender: Skipper Cliche in Treatment: 0 Vital Signs Time Taken: 09:07 Temperature (F): 98.4 Height (in): 67 Pulse (bpm): 82 Source: Stated Respiratory Rate (breaths/min): 18 Weight (lbs): 206 Blood Pressure (mmHg): 115/78 Source: Stated Reference Range: 80 - 120 mg / dl Body Mass Index (BMI): 32.3 Electronic Signature(s) Signed: 08/04/2021 2:31:10 PM By: Carlene Coria RN Entered By: Carlene Coria on 08/04/2021 09:07:43

## 2021-08-04 NOTE — Progress Notes (Signed)
MIOSOTIS, WETSEL ([id number]) Visit Report for 08/04/2021 Abuse Risk Screen Details Patient Name: Maureen Randall, Maureen Randall Date of Service: 08/04/2021 8:45 AM Medical Record Number: [id number] Patient Account Number: [id number] Date of Birth/Sex: 12-12-1965 (56 y.o. F) Treating RN: Carlene Coria Primary Care Lot Medford: McLean-Scocuzza, Olivia Mackie Other Clinician: Referring Melis Trochez: McLean-Scocuzza, Olivia Mackie Treating Destaney Sarkis/Extender: Skipper Cliche in Treatment: 0 Abuse Risk Screen Items Answer ABUSE RISK SCREEN: Has anyone close to you tried to hurt or harm you recentlyo No Do you feel uncomfortable with anyone in your familyo No Has anyone forced you do things that you didnot want to doo No Electronic Signature(s) Signed: 08/04/2021 2:31:10 PM By: Carlene Coria RN Entered By: Carlene Coria on 08/04/2021 [place] Maureen Randall ([id number]) -------------------------------------------------------------------------------- Activities of Daily Living Details Patient Name: Maureen Randall Date of Service: 08/04/2021 8:45 AM Medical Record Number: [id number] Patient Account Number: [id number] Date of Birth/Sex: [birth year] (56 y.o. F) Treating RN: Carlene Coria Primary Care Damaree Sargent: McLean-Scocuzza, Olivia Mackie Other Clinician: Referring Aowyn Rozeboom: McLean-Scocuzza, Olivia Mackie Treating Jakie Debow/Extender: Skipper Cliche in Treatment: 0 Activities of Daily Living Items Answer Activities of Daily Living (Please select one for each item) Drive Automobile Completely Able Take Medications Completely Able Use Telephone Completely Able Care for Appearance Completely Able Use Toilet Completely Able Bath / Shower Completely Able Dress Self Completely Able Feed Self Completely Able Walk Completely Able Get In / Out Bed Completely Able Housework Completely Able Prepare Meals Completely [place] for Self Completely Able Electronic Signature(s) Signed: 08/04/2021 2:31:10 PM By: Carlene Coria RN Entered By: Carlene Coria on 08/04/2021 09:10:26 Maureen Randall ([id number]) -------------------------------------------------------------------------------- Education Screening Details Patient Name: Maureen Randall Date of Service: 08/04/2021 8:45 AM Medical Record Number: [id number] Patient Account Number: [id number] Date of Birth/Sex: [birth year] (56 y.o. F) Treating RN: Carlene Coria Primary Care Jeylin Woodmansee: McLean-Scocuzza, Olivia Mackie Other Clinician: Referring Audie Stayer: McLean-Scocuzza, Olivia Mackie Treating Fredi Geiler/Extender: Skipper Cliche in Treatment: 0 Primary Learner Assessed: Patient Learning Preferences/Education Level/Primary Language Learning Preference: Explanation Highest Education Level: High School Preferred Language: English Cognitive Barrier Language Barrier: No Translator Needed: No Memory Deficit: No Emotional Barrier: No Cultural/Religious Beliefs Affecting Medical Care: No Physical Barrier Impaired Vision: Yes Glasses Impaired Hearing: No Decreased Hand dexterity: No Knowledge/Comprehension Knowledge Level: Medium Comprehension Level: High Ability to understand written instructions: High Ability to understand verbal instructions: High Motivation Anxiety Level: Anxious Cooperation: Cooperative Education Importance: Acknowledges Need Interest in Health Problems: Asks Questions Perception: Coherent Willingness to Engage in Self-Management High Activities: Readiness to Engage in Self-Management High Activities: Electronic Signature(s) Signed: 08/04/2021 2:31:10 PM By: Carlene Coria RN Entered By: Carlene Coria on 08/04/2021 09:10:54 Maureen Randall ([id number]) -------------------------------------------------------------------------------- Fall Risk Assessment Details Patient Name: Maureen Randall Date of Service: 08/04/2021 8:45 AM Medical Record Number: [id number] Patient Account Number: [id number] Date of Birth/Sex: July 29, 1965 (56 y.o.  F) Treating RN: Carlene Coria Primary Care Oberia Beaudoin: McLean-Scocuzza, Olivia Mackie Other Clinician: Referring Dusty Raczkowski: McLean-Scocuzza, Olivia Mackie Treating Kynlee Koenigsberg/Extender: Skipper Cliche in Treatment: 0 Fall Risk Assessment Items Have you had 2 or more falls in the last 12 monthso 0 Yes Have you had any fall that resulted in injury in the last 12 monthso 0 Yes FALLS RISK SCREEN History of falling - immediate or within 3 months 25 Yes Secondary diagnosis (Do you have 2 or more medical diagnoseso) 0 No Ambulatory aid None/bed rest/wheelchair/nurse 0 No Crutches/cane/walker 0 No Furniture 0 No Intravenous therapy Access/Saline/Heparin Lock 0 No Gait/Transferring Normal/ bed rest/ wheelchair 0 No Weak (short steps with  or without shuffle, stooped but able to lift head while walking, may 0 No seek support from furniture) Impaired (short steps with shuffle, may have difficulty arising from chair, head down, impaired 0 No balance) Mental Status Oriented to own ability 0 No Electronic Signature(s) Signed: 08/04/2021 2:31:10 PM By: Carlene Coria RN Entered By: Carlene Coria on 08/04/2021 09:11:20 Maureen Randall ([id number]) -------------------------------------------------------------------------------- Foot Assessment Details Patient Name: Maureen Randall Date of Service: 08/04/2021 8:45 AM Medical Record Number: [id number] Patient Account Number: [id number] Date of Birth/Sex: 01-May-1966 (56 y.o. F) Treating RN: Carlene Coria Primary Care Graciana Sessa: McLean-Scocuzza, Olivia Mackie Other Clinician: Referring Anjelique Makar: McLean-Scocuzza, Olivia Mackie Treating Sravya Grissom/Extender: Skipper Cliche in Treatment: 0 Foot Assessment Items Site Locations + = Sensation present, - = Sensation absent, C = Callus, U = Ulcer R = Redness, W = Warmth, M = Maceration, PU = Pre-ulcerative lesion F = Fissure, S = Swelling, D = Dryness Assessment Right: Left: Other Deformity: No No Prior Foot Ulcer: No No Prior Amputation:  No No Charcot Joint: No No Ambulatory Status: Ambulatory Without Help Gait: Steady Electronic Signature(s) Signed: 08/04/2021 2:31:10 PM By: Carlene Coria RN Entered By: Carlene Coria on 08/04/2021 09:12:37 Maureen Randall ([id number]) -------------------------------------------------------------------------------- Nutrition Risk Screening Details Patient Name: Maureen Randall Date of Service: 08/04/2021 8:45 AM Medical Record Number: [id number] Patient Account Number: [id number] Date of Birth/Sex: [birth year] (56 y.o. F) Treating RN: Carlene Coria Primary Care Chakita Mcgraw: McLean-Scocuzza, Olivia Mackie Other Clinician: Referring Taevion Sikora: McLean-Scocuzza, Olivia Mackie Treating Calayah Guadarrama/Extender: Skipper Cliche in Treatment: 0 Height (in): 67 Weight (lbs): 206 Body Mass Index (BMI): 32.3 Nutrition Risk Screening Items Score Screening NUTRITION RISK SCREEN: I have an illness or condition that made me change the kind and/or amount of food I eat 0 No I eat fewer than two meals per day 0 No I eat few fruits and vegetables, or milk products 0 No I have three or more drinks of beer, liquor or wine almost every day 0 No I have tooth or mouth problems that make it hard for me to eat 0 No I don't always have enough money to buy the food I need 0 No I eat alone most of the time 0 No I take three or more different prescribed or over-the-counter drugs a day 1 Yes Without wanting to, I have lost or gained 10 pounds in the last six months 0 No I am not always physically able to shop, cook and/or feed myself 0 No Nutrition Protocols Good Risk Protocol 0 No interventions needed Moderate Risk Protocol High Risk Proctocol Risk Level: Good Risk Score: 1 Electronic Signature(s) Signed: 08/04/2021 2:31:10 PM By: Carlene Coria RN Entered By: Carlene Coria on 08/04/2021 09:11:38

## 2021-08-06 ENCOUNTER — Other Ambulatory Visit: Payer: Self-pay | Admitting: Internal Medicine

## 2021-08-06 DIAGNOSIS — M1990 Unspecified osteoarthritis, unspecified site: Secondary | ICD-10-CM

## 2021-08-14 ENCOUNTER — Ambulatory Visit: Payer: 59 | Admitting: Psychiatry

## 2021-08-21 ENCOUNTER — Encounter: Payer: 59 | Attending: Physician Assistant | Admitting: Physician Assistant

## 2021-08-21 ENCOUNTER — Other Ambulatory Visit: Payer: Self-pay

## 2021-08-21 ENCOUNTER — Ambulatory Visit: Payer: 59 | Admitting: Gastroenterology

## 2021-08-21 DIAGNOSIS — Z09 Encounter for follow-up examination after completed treatment for conditions other than malignant neoplasm: Secondary | ICD-10-CM | POA: Diagnosis not present

## 2021-08-21 DIAGNOSIS — L97822 Non-pressure chronic ulcer of other part of left lower leg with fat layer exposed: Secondary | ICD-10-CM | POA: Diagnosis present

## 2021-08-21 DIAGNOSIS — Y839 Surgical procedure, unspecified as the cause of abnormal reaction of the patient, or of later complication, without mention of misadventure at the time of the procedure: Secondary | ICD-10-CM | POA: Insufficient documentation

## 2021-08-21 DIAGNOSIS — Z86718 Personal history of other venous thrombosis and embolism: Secondary | ICD-10-CM | POA: Diagnosis not present

## 2021-08-21 DIAGNOSIS — Z7901 Long term (current) use of anticoagulants: Secondary | ICD-10-CM | POA: Diagnosis not present

## 2021-08-21 DIAGNOSIS — T8131XA Disruption of external operation (surgical) wound, not elsewhere classified, initial encounter: Secondary | ICD-10-CM | POA: Insufficient documentation

## 2021-08-21 DIAGNOSIS — G8929 Other chronic pain: Secondary | ICD-10-CM | POA: Diagnosis not present

## 2021-08-21 NOTE — Progress Notes (Addendum)
BONNY, VANLEEUWEN ([id number]) Visit Report for 08/21/2021 Chief Complaint Document Details Patient Name: Maureen Randall, Maureen Randall Date of Service: 08/21/2021 9:00 AM Medical Record Number: [id number] Patient Account Number: [id number] Date of Birth/Sex: 04-Sep-1965 (56 y.o. F) Treating RN: Levora Dredge Primary Care Provider: McLean-Scocuzza, Olivia Mackie Other Clinician: Referring Provider: McLean-Scocuzza, Olivia Mackie Treating Provider/Extender: Skipper Cliche in Treatment: 2 Information Obtained from: Patient Chief Complaint Left lateral LE Surgical Ulcer Electronic Signature(s) Signed: 08/21/2021 9:15:56 AM By: Worthy Keeler PA-C Entered By: Worthy Keeler on 08/21/2021 09:15:56 Maureen Randall ([id number]) -------------------------------------------------------------------------------- HPI Details Patient Name: Maureen Randall Date of Service: 08/21/2021 9:00 AM Medical Record Number: [id number] Patient Account Number: [id number] Date of Birth/Sex: 04/21/1966 (56 y.o. F) Treating RN: Levora Dredge Primary Care Provider: McLean-Scocuzza, Olivia Mackie Other Clinician: Referring Provider: McLean-Scocuzza, Olivia Mackie Treating Provider/Extender: Skipper Cliche in Treatment: 2 History of Present Illness HPI Description: 08/04/2021 patient presents today for initial inspection here in our clinic concerning a DC hist surgical wound over the left lower leg close to the ankle region which actually originally occurred on 31 Maureen Randall 2022. This was a surgery in regard to the peroneal nerve in order to diminish pain in the ankle and foot region from what I am understanding. Nonetheless the patient had a unfortunate course following the surgery I went through the pictures she actually had a very good chronology and pictures for me to review which did actually enlightened a lot of what was going on during the time. It appears that she initially appeared to be healing and then had an area of necrotic tissue that developed  and then slowly this began to soften it became more slough as opposed to eschar and then actually had a fairly deep hole in the center part. My suspicion is that she either had a seroma or else a hematoma that formed underneath causing some necrotic damage to the underlying structure. Nonetheless following the pictures it does appear that this actually healed pretty well and started to fill in and overall was pretty good even up to a week ago. However then she did put some DuoDERM over top of it at their suggestion she has been going to the [facility] and subsequently this caused it to break open as it can get rolled up and bunched up causing some damage here. Otherwise the patient's wound does not appear to be showing any signs of infection and today it actually looks pretty good. She does question why there is such a dark discoloration where the scar areas and I did give her some information on that as well as far as what happens sometimes when scars form. Nonetheless overall I really feel like that she is not doing too poorly and I am hopeful this is pretty much about healed for her. She does have a history of long-term use of anticoagulants due to a deep vein thrombosis which has actually resolved currently that was actually just reevaluated this week and was negative finally. Hopefully she will be off of the blood thinner soon. Otherwise she has no other major medical problems other than some chronic pain issues. 08/21/2021 upon evaluation today patient appears to be doing well with regard to her wound. In fact this appears to be completely healed which is great news. Fortunately I do not see any signs of active infection locally or systemically at this time which is also great news. Electronic Signature(s) Signed: 08/21/2021 9:21:23 AM By: Worthy Keeler PA-C Entered By: Melburn Hake,  Mariany Mackintosh on 08/21/2021 09:21:22 Maureen Randall, Maureen Randall  ([id number]) -------------------------------------------------------------------------------- Physical Exam Details Patient Name: Maureen Randall, Maureen Randall Date of Service: 08/21/2021 9:00 AM Medical Record Number: [id number] Patient Account Number: [id number] Date of Birth/Sex: 1966/01/02 (56 y.o. F) Treating RN: Levora Dredge Primary Care Provider: McLean-Scocuzza, Olivia Mackie Other Clinician: Referring Provider: McLean-Scocuzza, Olivia Mackie Treating Provider/Extender: Skipper Cliche in Treatment: 2 Constitutional Well-nourished and well-hydrated in no acute distress. Respiratory normal breathing without difficulty. Psychiatric this patient is able to make decisions and demonstrates good insight into disease process. Alert and Oriented x 3. pleasant and cooperative. Notes Patient's wound bed showed signs of complete epithelization which is awesome. I do believe that we are on the right track which is great. I think that she just needs to continue to monitor the scarring making sure nothing tries to reopen and assuming it does not we should be good to go I do believe. Electronic Signature(s) Signed: 08/21/2021 9:21:45 AM By: Worthy Keeler PA-C Entered By: Worthy Keeler on 08/21/2021 09:21:45 Maureen Randall ([id number]) -------------------------------------------------------------------------------- Physician Orders Details Patient Name: Maureen Randall Date of Service: 08/21/2021 9:00 AM Medical Record Number: [id number] Patient Account Number: [id number] Date of Birth/Sex: [birth year] (56 y.o. F) Treating RN: Levora Dredge Primary Care Provider: McLean-Scocuzza, Olivia Mackie Other Clinician: Referring Provider: McLean-Scocuzza, Olivia Mackie Treating Provider/Extender: Skipper Cliche in Treatment: 2 Verbal / Phone Orders: No Diagnosis Coding ICD-10 Coding Code Description T81.31XA Disruption of external operation (surgical) wound, not elsewhere classified, initial encounter L97.822 Non-pressure  chronic ulcer of other part of left lower leg with fat layer exposed Z86.718 Personal history of other venous thrombosis and embolism Z79.01 Long term (current) use of anticoagulants Discharge From [facility] Services o Discharge from [place] Treatment Complete - Please call with any issues to healed wound o Elevate, Exercise Daily and Avoid Standing for Long Periods of Time. o DO YOUR BEST to sleep in the bed at night. DO NOT sleep in your recliner. Long hours of sitting in a recliner leads to swelling of the legs and/or potential wounds on your backside. Electronic Signature(s) Signed: 08/21/2021 9:31:21 AM By: Worthy Keeler PA-C Signed: 08/21/2021 4:20:39 PM By: Levora Dredge Entered By: Levora Dredge on 08/21/2021 09:20:18 Maureen Randall ([id number]) -------------------------------------------------------------------------------- Problem List Details Patient Name: Maureen Randall Date of Service: 08/21/2021 9:00 AM Medical Record Number: [id number] Patient Account Number: [id number] Date of Birth/Sex: 01/02/1966 (56 y.o. F) Treating RN: Levora Dredge Primary Care Provider: McLean-Scocuzza, Olivia Mackie Other Clinician: Referring Provider: McLean-Scocuzza, Olivia Mackie Treating Provider/Extender: Skipper Cliche in Treatment: 2 Active Problems ICD-10 Encounter Code Description Active Date MDM Diagnosis T81.31XA Disruption of external operation (surgical) wound, not elsewhere 08/04/2021 No Yes classified, initial encounter L97.822 Non-pressure chronic ulcer of other part of left lower leg with fat layer 08/04/2021 No Yes exposed Z86.718 Personal history of other venous thrombosis and embolism 08/04/2021 No Yes Z79.01 Long term (current) use of anticoagulants 08/04/2021 No Yes Inactive Problems Resolved Problems Electronic Signature(s) Signed: 08/21/2021 9:15:50 AM By: Worthy Keeler PA-C Entered By: Worthy Keeler on 08/21/2021 09:15:50 Maureen Randall  ([id number]) -------------------------------------------------------------------------------- Progress Note Details Patient Name: Maureen Randall Date of Service: 08/21/2021 9:00 AM Medical Record Number: [id number] Patient Account Number: [id number] Date of Birth/Sex: [birth year] (56 y.o. F) Treating RN: Levora Dredge Primary Care Provider: McLean-Scocuzza, Olivia Mackie Other Clinician: Referring Provider: McLean-Scocuzza, Olivia Mackie Treating Provider/Extender: Skipper Cliche in Treatment: 2 Subjective Chief Complaint Information obtained from Patient Left lateral LE Surgical Ulcer History of Present Illness (HPI)  08/04/2021 patient presents today for initial inspection here in our clinic concerning a DC hist surgical wound over the left lower leg close to the ankle region which actually originally occurred on 31 Maureen Randall 2022. This was a surgery in regard to the peroneal nerve in order to diminish pain in the ankle and foot region from what I am understanding. Nonetheless the patient had a unfortunate course following the surgery I went through the pictures she actually had a very good chronology and pictures for me to review which did actually enlightened a lot of what was going on during the time. It appears that she initially appeared to be healing and then had an area of necrotic tissue that developed and then slowly this began to soften it became more slough as opposed to eschar and then actually had a fairly deep hole in the center part. My suspicion is that she either had a seroma or else a hematoma that formed underneath causing some necrotic damage to the underlying structure. Nonetheless following the pictures it does appear that this actually healed pretty well and started to fill in and overall was pretty good even up to a week ago. However then she did put some DuoDERM over top of it at their suggestion she has been going to the [facility] and subsequently this caused it to  break open as it can get rolled up and bunched up causing some damage here. Otherwise the patient's wound does not appear to be showing any signs of infection and today it actually looks pretty good. She does question why there is such a dark discoloration where the scar areas and I did give her some information on that as well as far as what happens sometimes when scars form. Nonetheless overall I really feel like that she is not doing too poorly and I am hopeful this is pretty much about healed for her. She does have a history of long-term use of anticoagulants due to a deep vein thrombosis which has actually resolved currently that was actually just reevaluated this week and was negative finally. Hopefully she will be off of the blood thinner soon. Otherwise she has no other major medical problems other than some chronic pain issues. 08/21/2021 upon evaluation today patient appears to be doing well with regard to her wound. In fact this appears to be completely healed which is great news. Fortunately I do not see any signs of active infection locally or systemically at this time which is also great news. Objective Constitutional Well-nourished and well-hydrated in no acute distress. Vitals Time Taken: 9:02 AM, Height: 67 in, Weight: 206 lbs, BMI: 32.3, Temperature: 98.1 F, Pulse: 70 bpm, Respiratory Rate: 18 breaths/min, Blood Pressure: 108/75 mmHg. Respiratory normal breathing without difficulty. Psychiatric this patient is able to make decisions and demonstrates good insight into disease process. Alert and Oriented x 3. pleasant and cooperative. General Notes: Patient's wound bed showed signs of complete epithelization which is awesome. I do believe that we are on the right track which is great. I think that she just needs to continue to monitor the scarring making sure nothing tries to reopen and assuming it does not we should be good to go I do believe. Integumentary (Hair, Skin) Wound  #1 status is Healed - Epithelialized. Original cause of wound was Surgical Injury. The date acquired was: 05/08/2021. The wound has been in treatment 2 weeks. The wound is located on the Left,Lateral Lower Leg. The wound measures 0cm length x  0cm width x 0cm depth; 0cm^2 area and 0cm^3 volume. There is no tunneling or undermining noted. There is a none present amount of drainage noted. There is no granulation within the wound bed. There is no necrotic tissue within the wound bed. Maureen Randall, Maureen Randall ([id number]) Assessment Active Problems ICD-10 Disruption of external operation (surgical) wound, not elsewhere classified, initial encounter Non-pressure chronic ulcer of other part of left lower leg with fat layer exposed Personal history of other venous thrombosis and embolism Long term (current) use of anticoagulants Plan Discharge From [facility] Services: Discharge from [place] Treatment Complete - Please call with any issues to healed wound Elevate, Exercise Daily and Avoid Standing for Long Periods of Time. DO YOUR BEST to sleep in the bed at night. DO NOT sleep in your recliner. Long hours of sitting in a recliner leads to swelling of the legs and/or potential wounds on your backside. 1. Would recommend that she continue with the Moderna scar cream which I think is appropriate she is also using little scar strips which I think will work for her as well. 2. Also can recommend the patient continue to monitor for any signs of worsening or infection if anything changes she should let me know soon as possible. We will see patient back for reevaluation in 1 week here in the clinic. If anything worsens or changes patient will contact our office for additional recommendations. Electronic Signature(s) Signed: 08/21/2021 9:22:16 AM By: Worthy Keeler PA-C Entered By: Worthy Keeler on 08/21/2021 09:22:16 Maureen Randall  ([id number]) -------------------------------------------------------------------------------- SuperBill Details Patient Name: Maureen Randall Date of Service: 08/21/2021 Medical Record Number: [id number] Patient Account Number: [id number] Date of Birth/Sex: 19-Apr-1966 (56 y.o. F) Treating RN: Levora Dredge Primary Care Provider: McLean-Scocuzza, Olivia Mackie Other Clinician: Referring Provider: McLean-Scocuzza, Olivia Mackie Treating Provider/Extender: Skipper Cliche in Treatment: 2 Diagnosis Coding ICD-10 Codes Code Description T81.31XA Disruption of external operation (surgical) wound, not elsewhere classified, initial encounter L97.822 Non-pressure chronic ulcer of other part of left lower leg with fat layer exposed Z86.718 Personal history of other venous thrombosis and embolism Z79.01 Long term (current) use of anticoagulants Facility Procedures CPT4 Code: [id number] Description: [id number] - WOUND CARE VISIT-LEV 2 EST PT Modifier: Quantity: 1 Physician Procedures CPT4 Code: 2482500 Description: 99213 - WC PHYS LEVEL 3 - EST PT Modifier: Quantity: 1 CPT4 Code: Description: ICD-10 Diagnosis Description T81.31XA Disruption of external operation (surgical) wound, not elsewhere classifi L97.822 Non-pressure chronic ulcer of other part of left lower leg with fat layer Z86.718 Personal history of other venous  thrombosis and embolism Z79.01 Long term (current) use of anticoagulants Modifier: ed, initial encounter exposed Quantity: Electronic Signature(s) Signed: 08/21/2021 9:22:31 AM By: Worthy Keeler PA-C Entered By: Worthy Keeler on 08/21/2021 09:22:30

## 2021-08-21 NOTE — Progress Notes (Signed)
Maureen Randall ([id number]) Visit Report for 08/21/2021 Arrival Information Details Patient Name: Maureen Randall Date of Service: 08/21/2021 9:00 AM Medical Record Number: [id number] Patient Account Number: [id number] Date of Birth/Sex: December 12, 1965 (56 y.o. F) Treating RN: Levora Dredge Primary Care Marelly Wehrman: McLean-Scocuzza, Olivia Mackie Other Clinician: Referring Kima Malenfant: McLean-Scocuzza, Olivia Mackie Treating Remi Lopata/Extender: Skipper Cliche in Treatment: 2 Visit Information History Since Last Visit Added or deleted any medications: No Patient Arrived: Ambulatory Any new allergies or adverse reactions: No Arrival Time: 08:56 Had a fall or experienced change in No Accompanied By: self activities of daily living that may affect Transfer Assistance: None risk of falls: Patient Identification Verified: Yes Hospitalized since last visit: No Secondary Verification Process Completed: Yes Has Dressing in Place as Prescribed: Yes Patient Requires Transmission-Based No Pain Present Now: No Precautions: Patient Has Alerts: Yes Patient Alerts: Patient on Blood Thinner Electronic Signature(s) Signed: 08/21/2021 4:20:39 PM By: Levora Dredge Entered By: Levora Dredge on 08/21/2021 09:02:06 Maureen Randall ([id number]) -------------------------------------------------------------------------------- Clinic Level of Care Assessment Details Patient Name: Maureen Randall Date of Service: 08/21/2021 9:00 AM Medical Record Number: [id number] Patient Account Number: [id number] Date of Birth/Sex: 07/01/1966 (56 y.o. F) Treating RN: Levora Dredge Primary Care Sharnetta Gielow: McLean-Scocuzza, Olivia Mackie Other Clinician: Referring Adonay Scheier: McLean-Scocuzza, Olivia Mackie Treating Hartley Urton/Extender: Skipper Cliche in Treatment: 2 Clinic Level of Care Assessment Items TOOL 4 Quantity Score []  - Use when only an EandM is performed on FOLLOW-UP visit 0 ASSESSMENTS - Nursing Assessment / Reassessment []  -  Reassessment of Co-morbidities (includes updates in patient status) 0 []  - 0 Reassessment of Adherence to Treatment Plan ASSESSMENTS - Wound and Skin Assessment / Reassessment X - Simple Wound Assessment / Reassessment - one wound 1 5 []  - 0 Complex Wound Assessment / Reassessment - multiple wounds []  - 0 Dermatologic / Skin Assessment (not related to wound area) ASSESSMENTS - Focused Assessment []  - Circumferential Edema Measurements - multi extremities 0 []  - 0 Nutritional Assessment / Counseling / Intervention []  - 0 Lower Extremity Assessment (monofilament, tuning fork, pulses) []  - 0 Peripheral Arterial Disease Assessment (using hand held doppler) ASSESSMENTS - Ostomy and/or Continence Assessment and Care []  - Incontinence Assessment and Management 0 []  - 0 Ostomy Care Assessment and Management (repouching, etc.) PROCESS - Coordination of Care X - Simple Patient / Family Education for ongoing care 1 15 []  - 0 Complex (extensive) Patient / Family Education for ongoing care []  - 0 Staff obtains Programmer, systems, Records, Test Results / Process Orders []  - 0 Staff telephones HHA, Nursing Homes / Clarify orders / etc []  - 0 Routine Transfer to another Facility (non-emergent condition) []  - 0 Routine Hospital Admission (non-emergent condition) []  - 0 New Admissions / Biomedical engineer / Ordering NPWT, Apligraf, etc. []  - 0 Emergency Hospital Admission (emergent condition) X- 1 10 Simple Discharge Coordination []  - 0 Complex (extensive) Discharge Coordination PROCESS - Special Needs []  - Pediatric / Minor Patient Management 0 []  - 0 Isolation Patient Management []  - 0 Hearing / Language / Visual special needs []  - 0 Assessment of Community assistance (transportation, D/C planning, etc.) []  - 0 Additional assistance / Altered mentation []  - 0 Support Surface(s) Assessment (bed, cushion, seat, etc.) INTERVENTIONS - Wound Cleansing / Measurement Maureen Randall A.  ([id number]) X- 1 5 Simple Wound Cleansing - one wound []  - 0 Complex Wound Cleansing - multiple wounds X- 1 5 Wound Imaging (photographs - any number of wounds) []  - 0 Wound Tracing (instead of photographs) []  - 0  Simple Wound Measurement - one wound []  - 0 Complex Wound Measurement - multiple wounds INTERVENTIONS - Wound Dressings []  - Small Wound Dressing one or multiple wounds 0 []  - 0 Medium Wound Dressing one or multiple wounds []  - 0 Large Wound Dressing one or multiple wounds []  - 0 Application of Medications - topical []  - 0 Application of Medications - injection INTERVENTIONS - Miscellaneous []  - External ear exam 0 []  - 0 Specimen Collection (cultures, biopsies, blood, body fluids, etc.) []  - 0 Specimen(s) / Culture(s) sent or taken to Lab for analysis []  - 0 Patient Transfer (multiple staff / Civil Service fast streamer / Similar devices) []  - 0 Simple Staple / Suture removal (25 or less) []  - 0 Complex Staple / Suture removal (26 or more) []  - 0 Hypo / Hyperglycemic Management (close monitor of Blood Glucose) []  - 0 Ankle / Brachial Index (ABI) - do not check if billed separately X- 1 5 Vital Signs Has the patient been seen at the hospital within the last three years: Yes Total Score: 45 Level Of Care: New/Established - Level 2 Electronic Signature(s) Signed: 08/21/2021 4:20:39 PM By: Levora Dredge Entered By: Levora Dredge on 08/21/2021 09:19:13 Maureen Randall ([id number]) -------------------------------------------------------------------------------- Encounter Discharge Information Details Patient Name: Maureen Randall Date of Service: 08/21/2021 9:00 AM Medical Record Number: [id number] Patient Account Number: [id number] Date of Birth/Sex: 1965/10/04 (56 y.o. F) Treating RN: Levora Dredge Primary Care Emiliana Blaize: McLean-Scocuzza, Olivia Mackie Other Clinician: Referring Gerson Fauth: McLean-Scocuzza, Olivia Mackie Treating Mavrik Bynum/Extender: Skipper Cliche in Treatment:  2 Encounter Discharge Information Items Discharge Condition: Stable Ambulatory Status: Ambulatory Discharge Destination: Home Transportation: Private Auto Accompanied By: self Schedule Follow-up Appointment: No Clinical Summary of Care: Electronic Signature(s) Signed: 08/21/2021 4:20:39 PM By: Levora Dredge Entered By: Levora Dredge on 08/21/2021 09:22:30 Maureen Randall ([id number]) -------------------------------------------------------------------------------- Lower Extremity Assessment Details Patient Name: Maureen Randall Date of Service: 08/21/2021 9:00 AM Medical Record Number: [id number] Patient Account Number: [id number] Date of Birth/Sex: 1966-01-29 (56 y.o. F) Treating RN: Levora Dredge Primary Care Marliss Buttacavoli: McLean-Scocuzza, Olivia Mackie Other Clinician: Referring Merland Holness: McLean-Scocuzza, Olivia Mackie Treating Merrilyn Legler/Extender: Skipper Cliche in Treatment: 2 Edema Assessment Assessed: [Left: No] [Right: No] Edema: [Left: N] [Right: o] Calf Left: Right: Point of Measurement: 32 cm From Medial Instep 40 cm Ankle Left: Right: Point of Measurement: 10 cm From Medial Instep 21.5 cm Vascular Assessment Pulses: Dorsalis Pedis Palpable: [Left:Yes] Posterior Tibial Palpable: [Left:Yes] Electronic Signature(s) Signed: 08/21/2021 4:20:39 PM By: Levora Dredge Entered By: Levora Dredge on 08/21/2021 09:05:56 Maureen Randall ([id number]) -------------------------------------------------------------------------------- Multi Wound Chart Details Patient Name: Maureen Randall Date of Service: 08/21/2021 9:00 AM Medical Record Number: [id number] Patient Account Number: [id number] Date of Birth/Sex: 12-Apr-1966 (56 y.o. F) Treating RN: Levora Dredge Primary Care Amrita Radu: McLean-Scocuzza, Olivia Mackie Other Clinician: Referring Yazlynn Birkeland: McLean-Scocuzza, Olivia Mackie Treating Taym Twist/Extender: Skipper Cliche in Treatment: 2 Vital Signs Height(in): 67 Pulse(bpm): 70 Weight(lbs):  206 Blood Pressure(mmHg): 108/75 Body Mass Index(BMI): 32.3 Temperature(F): 98.1 Respiratory Rate(breaths/min): 18 Photos: [N/A:N/A] Wound Location: Left, Lateral Lower Leg N/A N/A Wounding Event: Surgical Injury N/A N/A Primary Etiology: Dehisced Wound N/A N/A Date Acquired: 05/08/2021 N/A N/A Weeks of Treatment: 2 N/A N/A Wound Status: Open N/A N/A Wound Recurrence: No N/A N/A Measurements L x W x D (cm) 0.1x0.1x0.1 N/A N/A Area (cm) : 0.008 N/A N/A Volume (cm) : 0.001 N/A N/A % Reduction in Area: 99.50% N/A N/A % Reduction in Volume: 99.30% N/A N/A Classification: Full Thickness Without Exposed N/A N/A Support Structures Exudate Amount: None  Present N/A N/A Granulation Amount: None Present (0%) N/A N/A Necrotic Amount: None Present (0%) N/A N/A Exposed Structures: Fascia: No N/A N/A Fat Layer (Subcutaneous Tissue): No Tendon: No Muscle: No Joint: No Bone: No Epithelialization: Large (67-100%) N/A N/A Treatment Notes Electronic Signature(s) Signed: 08/21/2021 4:20:39 PM By: Levora Dredge Entered By: Levora Dredge on 08/21/2021 09:17:10 Maureen Randall ([id number]) -------------------------------------------------------------------------------- Multi-Disciplinary Care Plan Details Patient Name: Maureen Randall Date of Service: 08/21/2021 9:00 AM Medical Record Number: [id number] Patient Account Number: [id number] Date of Birth/Sex: [birth year] (56 y.o. F) Treating RN: Levora Dredge Primary Care Alonnah Lampkins: McLean-Scocuzza, Olivia Mackie Other Clinician: Referring Burwell Bethel: McLean-Scocuzza, Olivia Mackie Treating Rickia Freeburg/Extender: Skipper Cliche in Treatment: 2 Active Inactive Electronic Signature(s) Signed: 08/21/2021 4:20:39 PM By: Levora Dredge Entered By: Levora Dredge on 08/21/2021 09:16:57 Maureen Randall ([id number]) -------------------------------------------------------------------------------- Pain Assessment Details Patient Name: Maureen Randall Date of  Service: 08/21/2021 9:00 AM Medical Record Number: [id number] Patient Account Number: [id number] Date of Birth/Sex: May 18, 1966 (56 y.o. F) Treating RN: Levora Dredge Primary Care Sumie Remsen: McLean-Scocuzza, Olivia Mackie Other Clinician: Referring Eddi Hymes: McLean-Scocuzza, Olivia Mackie Treating Maloree Uplinger/Extender: Skipper Cliche in Treatment: 2 Active Problems Location of Pain Severity and Description of Pain Patient Has Paino No Site Locations Rate the pain. Current Pain Level: 0 Pain Management and Medication Current Pain Management: Electronic Signature(s) Signed: 08/21/2021 4:20:39 PM By: Levora Dredge Entered By: Levora Dredge on 08/21/2021 09:03:54 Maureen Randall ([id number]) -------------------------------------------------------------------------------- Patient/Caregiver Education Details Patient Name: Maureen Randall Date of Service: 08/21/2021 9:00 AM Medical Record Number: [id number] Patient Account Number: [id number] Date of Birth/Gender: [birth year] (56 y.o. F) Treating RN: Levora Dredge Primary Care Physician: McLean-Scocuzza, Olivia Mackie Other Clinician: Referring Physician: McLean-Scocuzza, Olivia Mackie Treating Physician/Extender: Skipper Cliche in Treatment: 2 Education Assessment Education Provided To: Patient Education Topics Provided Wound/Skin Impairment: Handouts: Other: caring for healed wound Methods: Explain/Verbal Responses: State content correctly Electronic Signature(s) Signed: 08/21/2021 4:20:39 PM By: Levora Dredge Entered By: Levora Dredge on 08/21/2021 09:19:42 Maureen Randall ([id number]) -------------------------------------------------------------------------------- Wound Assessment Details Patient Name: Maureen Randall Date of Service: 08/21/2021 9:00 AM Medical Record Number: [id number] Patient Account Number: [id number] Date of Birth/Sex: October 20, 1965 (56 y.o. F) Treating RN: Levora Dredge Primary Care Freedom Lopezperez: McLean-Scocuzza, Olivia Mackie Other  Clinician: Referring Lenea Bywater: McLean-Scocuzza, Olivia Mackie Treating Lolah Coghlan/Extender: Skipper Cliche in Treatment: 2 Wound Status Wound Number: 1 Primary Etiology: Dehisced Wound Wound Location: Left, Lateral Lower Leg Wound Status: Healed - Epithelialized Wounding Event: Surgical Injury Date Acquired: 05/08/2021 Weeks Of Treatment: 2 Clustered Wound: No Photos Wound Measurements Length: (cm) 0 Width: (cm) 0 Depth: (cm) 0 Area: (cm) 0 Volume: (cm) 0 % Reduction in Area: 100% % Reduction in Volume: 100% Epithelialization: Large (67-100%) Tunneling: No Undermining: No Wound Description Classification: Full Thickness Without Exposed Support Structure Exudate Amount: None Present s Foul Odor After Cleansing: No Slough/Fibrino No Wound Bed Granulation Amount: None Present (0%) Exposed Structure Necrotic Amount: None Present (0%) Fascia Exposed: No Fat Layer (Subcutaneous Tissue) Exposed: No Tendon Exposed: No Muscle Exposed: No Joint Exposed: No Bone Exposed: No Treatment Notes Wound #1 (Lower Leg) Wound Laterality: Left, Lateral Cleanser Peri-Wound Care Topical Primary Dressing MOOREA, BOISSONNEAULT ([id number]) Secondary Dressing Secured With Compression Wrap Compression Stockings Add-Ons Electronic Signature(s) Signed: 08/21/2021 4:20:39 PM By: Levora Dredge Entered By: Levora Dredge on 08/21/2021 09:17:42 Maureen Randall ([id number]) -------------------------------------------------------------------------------- Vitals Details Patient Name: Maureen Randall Date of Service: 08/21/2021 9:00 AM Medical Record Number: [id number] Patient Account Number: [id number] Date of Birth/Sex: 04/17/1966 (56 y.o. F) Treating RN: Levora Dredge  Primary Care Janari Gagner: McLean-Scocuzza, Olivia Mackie Other Clinician: Referring Madeline Bebout: McLean-Scocuzza, Olivia Mackie Treating Devaughn Savant/Extender: Skipper Cliche in Treatment: 2 Vital Signs Time Taken: 09:02 Temperature (F):  98.1 Height (in): 67 Pulse (bpm): 70 Weight (lbs): 206 Respiratory Rate (breaths/min): 18 Body Mass Index (BMI): 32.3 Blood Pressure (mmHg): 108/75 Reference Range: 80 - 120 mg / dl Electronic Signature(s) Signed: 08/21/2021 4:20:39 PM By: Levora Dredge Entered By: Levora Dredge on 08/21/2021 09:03:42

## 2021-08-22 ENCOUNTER — Ambulatory Visit (HOSPITAL_BASED_OUTPATIENT_CLINIC_OR_DEPARTMENT_OTHER): Payer: 59 | Admitting: Internal Medicine

## 2021-09-07 ENCOUNTER — Other Ambulatory Visit: Payer: Self-pay | Admitting: Gastroenterology

## 2021-09-11 ENCOUNTER — Ambulatory Visit (INDEPENDENT_AMBULATORY_CARE_PROVIDER_SITE_OTHER): Payer: 59 | Admitting: Psychiatry

## 2021-09-11 ENCOUNTER — Other Ambulatory Visit: Payer: Self-pay

## 2021-09-11 ENCOUNTER — Encounter: Payer: Self-pay | Admitting: Psychiatry

## 2021-09-11 VITALS — BP 104/70 | HR 72 | Temp 97.9°F | Wt 210.8 lb

## 2021-09-11 DIAGNOSIS — G971 Other reaction to spinal and lumbar puncture: Secondary | ICD-10-CM

## 2021-09-11 DIAGNOSIS — F411 Generalized anxiety disorder: Secondary | ICD-10-CM

## 2021-09-11 DIAGNOSIS — M961 Postlaminectomy syndrome, not elsewhere classified: Secondary | ICD-10-CM

## 2021-09-11 DIAGNOSIS — G4701 Insomnia due to medical condition: Secondary | ICD-10-CM

## 2021-09-11 DIAGNOSIS — G8220 Paraplegia, unspecified: Secondary | ICD-10-CM | POA: Insufficient documentation

## 2021-09-11 HISTORY — DX: Postlaminectomy syndrome, not elsewhere classified: M96.1

## 2021-09-11 HISTORY — DX: Other reaction to spinal and lumbar puncture: G97.1

## 2021-09-11 NOTE — Progress Notes (Signed)
BH MD OP Progress Note  09/11/2021 2:18 PM Maureen Randall  MRN:  [id number]  Chief Complaint:  Chief Complaint  Patient presents with   Follow-up: 56 year old Caucasian female with history of GAD, insomnia, chronic pain, was evaluated in office for medication management.   HPI: Maureen Randall is a 56 year old Caucasian female married, lives in [place], on disability, who has a history of GAD, insomnia, chronic pain, primary osteoarthritis, radiculopathy, degenerative disc disease, spondylolisthesis, bilateral carpal tunnel syndrome was evaluated in office today.  Patient continues to be in pain, multiple locations, right shoulder, neck, lower extremities.  Continues to be under the care of her providers.  Currently on Belbuca.  Patient reports she continues to be anxious about her current pain problems, she was told she may need another surgery.  Patient reports she is worried about the same.  However overall managing her anxiety symptoms okay.  Currently continues to be compliant on the duloxetine.  Does not know if this is beneficial however since anxiety symptoms are currently manageable she wants to stay on the duloxetine for now.  Long-term plan is to taper her off.  Reports trazodone is beneficial for sleep.  Taking the Belbuca earlier in the morning also helps with sleep at night.  Patient denies any suicidality, homicidality or perceptual disturbances.  Patient denies any other concerns today.  Visit Diagnosis:    ICD-10-CM   1. GAD (generalized anxiety disorder)  F41.1     2. Insomnia due to medical condition  G47.01    pain      Past Psychiatric History: Reviewed past psychiatric history from progress note on 04/30/2018.  Past Medical History:  Past Medical History:  Diagnosis Date   Anxiety    Arthritis    osteoarthritis-joints, feet, shoulder   Asthma    previously diagnosed after a bout of bronchitis/ no problems since   Bursitis    b/l hips   Chronic pain  syndrome    COPD (chronic obstructive pulmonary disease) (HCC) 2004   patient states probably bronchitis   Depression    DVT (deep venous thrombosis) (HCC) 2012   right leg. after surgery, while on BCP   GERD (gastroesophageal reflux disease)    Headache, post-myelogram 09/11/2021   Hyperlipidemia    Left leg DVT (HCC)    fem/pop 12/07/20 after right shoulder surgery 11/30/20   Neuropathy    post surgery   PVD (peripheral vascular disease) (HCC)    Spondylolisthesis     Past Surgical History:  Procedure Laterality Date   ABLATION     veins in lower legs   BACK SURGERY  07/2014   lumbar spine/ fusion 1 level   BREAST BIOPSY Left 2011   neg   CARPAL TUNNEL RELEASE Right 12/2015   [facility]   CARPAL TUNNEL RELEASE Left 12/2016   COLONOSCOPY WITH PROPOFOL N/A 02/29/2020   Procedure: COLONOSCOPY WITH PROPOFOL;  Surgeon: Midge Minium, MD;  Location: [facility];  Service: Endoscopy;  Laterality: N/A;   ESOPHAGOGASTRODUODENOSCOPY (EGD) WITH PROPOFOL N/A 02/29/2020   Procedure: ESOPHAGOGASTRODUODENOSCOPY (EGD) WITH PROPOFOL;  Surgeon: Midge Minium, MD;  Location: [facility];  Service: Endoscopy;  Laterality: N/A;   FOOT SURGERY Right 2015   broken navicular bone   HIP ARTHROSCOPY Right 05/2011   HIP SURGERY     left hip surgery 05/2018   JOINT REPLACEMENT Bilateral 12/2017 & 05/2018   hip   L5-S1 OLIF Dr. Sharolyn Douglas back surgery 09/2019  09/28/2019   left  ganglion cyst removal     12/23/19 Dr. Orland Jarred   MASS EXCISION Left 12/17/2019   Procedure: EXCISION TUMOR FOOT DEEP LEFT;  Surgeon: Recardo Evangelist, DPM;  Location: [facility];  Service: Podiatry;  Laterality: Left;  LMA W/LOCAL   NASAL TURBINATE REDUCTION Bilateral 06/12/2019   Procedure: TURBINATE REDUCTION/SUBMUCOSAL RESECTION;  Surgeon: Linus Salmons, MD;  Location: [facility];  Service: ENT;  Laterality: Bilateral;   REPLACEMENT TOTAL HIP W/  RESURFACING IMPLANTS Right 12/2017   [facility]    right shoulder surgery     11/28/20 for bone spur, torn labrum, re attached biceps tendon, tear Dr. Ranell Patrick   ROTATOR CUFF REPAIR     Dr. Ranell Patrick in 04/2019    SCLEROTHERAPY     veins in lower legs   SEPTOPLASTY Bilateral 06/12/2019   Procedure: SEPTOPLASTY;  Surgeon: Linus Salmons, MD;  Location: [facility];  Service: ENT;  Laterality: Bilateral;   SHOULDER SURGERY Right 12/2015   [facility] torn rotator cuff   SPINAL FUSION  2016   [facility] lumbar    Family Psychiatric History: Reviewed family psychiatric history from progress note on 04/30/2018.  Family History:  Family History  Problem Relation Age of Onset   Breast cancer Maternal Aunt    Breast cancer Paternal Aunt    Dementia Paternal Aunt    COPD Mother        quit smoking after 60 years   Arthritis Mother    Osteoarthritis Mother    Dementia Father        symptoms started in his 87s   Alzheimer's disease Father    Rheum arthritis Father    Rheum arthritis Other        FH   Dementia Other        "all my dad's side of the family"   Other Maternal Grandmother        got dementia as she got older    Dementia Paternal Grandmother     Social History: Reviewed social history from progress note on 04/30/2018. Social History   Socioeconomic History   Marital status: Married    Spouse name: fredrick   Number of children: 1   Years of education: Not on file   Highest education level: High school graduate  Occupational History    Comment: full time  Tobacco Use   Smoking status: Never   Smokeless tobacco: Never  Vaping Use   Vaping Use: Never used  Substance and Sexual Activity   Alcohol use: Yes    Alcohol/week: 1.0 standard drink    Types: 1 Cans of beer per week    Comment: may have drink 1x/month   Drug use: Never   Sexual activity: Yes  Other Topics Concern   Not on file  Social History Narrative   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    Married 1 child    Lives at home  with her husband   Right handed   Caffeine: maybe 1 cup/day (pepsi)   Social Determinants of Health   Financial Resource Strain: Not on file  Food Insecurity: Not on file  Transportation Needs: Not on file  Physical Activity: Not on file  Stress: Not on file  Social Connections: Not on file    Allergies:  Allergies  Allergen Reactions   Amoxicillin Hives   Septra [Sulfamethoxazole-Trimethoprim] Hives        Sulfamethoxazole    Tizanidine Other (See Comments)    Weak muscles  Weak muscles   Trimethoprim    Cephalexin Rash         Metabolic Disorder Labs: Lab Results  Component Value Date   HGBA1C 5.5 09/25/2019   No results found for: PROLACTIN Lab Results  Component Value Date   CHOL 226 (H) 01/23/2021   TRIG 113.0 01/23/2021   HDL 68.10 01/23/2021   CHOLHDL 3 01/23/2021   VLDL 22.6 01/23/2021   LDLCALC 135 (H) 01/23/2021   LDLCALC 151 (H) 09/22/2020   Lab Results  Component Value Date   TSH 1.49 09/22/2020   TSH 3.78 01/05/2019    Therapeutic Level Labs: No results found for: LITHIUM No results found for: VALPROATE No components found for:  CBMZ  Current Medications: Current Outpatient Medications  Medication Sig Dispense Refill   acetaminophen (TYLENOL) 650 MG CR tablet Take 1,300 mg by mouth every 8 (eight) hours as needed for pain.     baclofen (LIORESAL) 10 MG tablet Take 10 mg by mouth 3 (three) times daily as needed for muscle spasms.     BELBUCA 450 MCG FILM Take 450 mcg by mouth every 12 (twelve) hours as needed (pain).     Buprenorphine HCl (BELBUCA) 450 MCG FILM Place inside cheek 2 (two) times daily as needed.     cetirizine (ZYRTEC) 10 MG tablet Take 10 mg by mouth daily as needed for allergies.     dexlansoprazole (DEXILANT) 60 MG capsule TAKE 1 CAPSULE BY MOUTH EVERY DAY 90 capsule 1   diclofenac Sodium (VOLTAREN) 1 % GEL APPLY 2 GRAMS TO UPPER BODY FOUR TIMES DAILY AS NEEDED AND 4 GRAMS TO LOWER BODY FOUR TIMES DAILY AS NEEDED 100 g  11   DULoxetine (CYMBALTA) 60 MG capsule TAKE 1 CAPSULE BY MOUTH EVERY DAY 90 capsule 1   Elastic Bandages & Supports (RELIEF KNEE) MISC Apply in the morning and remove at night.     estradiol (ESTRACE) 0.1 MG/GM vaginal cream Place 1 Applicatorful vaginally every 14 (fourteen) days.     famotidine (PEPCID) 20 MG tablet TAKE 1 TABLET BY MOUTH EVERYDAY AT BEDTIME 90 tablet 3   hydrOXYzine (ATARAX/VISTARIL) 10 MG tablet Take 1 tablet (10 mg total) by mouth daily as needed for anxiety. 90 tablet 1   linaclotide (LINZESS) 145 MCG CAPS capsule Take 1 capsule (145 mcg total) by mouth daily before breakfast. 30 capsule 0   ondansetron (ZOFRAN-ODT) 8 MG disintegrating tablet Take 1 tablet (8 mg total) by mouth every 8 (eight) hours as needed for nausea or vomiting. 30 tablet 2   oxyCODONE-acetaminophen (PERCOCET) 10-325 MG tablet Take 1 tablet by mouth every 6 (six) hours as needed for pain.     phentermine (ADIPEX-P) 37.5 MG tablet Take 1 tablet (37.5 mg total) by mouth daily before breakfast. 60 tablet 0   pregabalin (LYRICA) 50 MG capsule Take 100 mg by mouth 2 (two) times daily.     Red Yeast Rice Extract (RED YEAST RICE PO) Take 1,200 mg by mouth daily.     rivaroxaban (XARELTO) 20 MG TABS tablet Take 10 tablets by mouth daily.     traZODone (DESYREL) 100 MG tablet Take 2 tablets (200 mg total) by mouth at bedtime as needed for sleep. 180 tablet 1   triamcinolone ointment (KENALOG) 0.1 % Apply topically.     valACYclovir (VALTREX) 500 MG tablet Take 500 mg by mouth daily as needed (outbreaks).     fluticasone (FLONASE) 50 MCG/ACT nasal spray SMARTSIG:1-2 Spray(s) Both Nares Daily PRN  No current facility-administered medications for this visit.     Musculoskeletal: Strength & Muscle Tone: within normal limits Gait & Station: normal Patient leans: N/A  Psychiatric Specialty Exam: Review of Systems  Musculoskeletal:  Positive for back pain and neck pain.       Rt.shoulder pain   Psychiatric/Behavioral:  Positive for sleep disturbance. The patient is nervous/anxious.   All other systems reviewed and are negative.  Blood pressure 104/70, pulse 72, temperature 97.9 F (36.6 C), temperature source Temporal, weight 210 lb 12.8 oz (95.6 kg).Body mass index is 33.02 kg/m.  General Appearance: Casual  Eye Contact:  Fair  Speech:  Clear and Coherent  Volume:  Normal  Mood:  Anxious  Affect:  Appropriate  Thought Process:  Goal Directed and Descriptions of Associations: Intact  Orientation:  Full (Time, Place, and Person)  Thought Content: Logical   Suicidal Thoughts:  No  Homicidal Thoughts:  No  Memory:  Immediate;   Fair Recent;   Fair Remote;   Fair  Judgement:  Fair  Insight:  Fair  Psychomotor Activity:  Normal  Concentration:  Concentration: Fair and Attention Span: Fair  Recall:  [employer] of Knowledge: Fair  Language: Fair  Akathisia:  No  Handed:  Right  AIMS (if indicated): done, 0  Assets:  Communication Skills Desire for Improvement Housing Intimacy Social Support Talents/Skills Transportation  ADL's:  Intact  Cognition: WNL  Sleep:  Poor   Screenings: GAD-7    Flowsheet Row Office Visit from 09/11/2021 in [facility] Video Visit from 03/01/2021 in [facility] Video Visit from 10/19/2020 in [facility] Video Visit from 09/21/2020 in [facility] Video Visit from 10/14/2019 in [facility]  Total GAD-7 Score 3 3 2 4 1       Mini-Mental    Flowsheet Row Office Visit from 09/23/2019 in [facility]  Total Score (max 30 points ) 27      PHQ2-9    Flowsheet Row Office Visit from 09/11/2021 in [facility] Video Visit from 05/16/2021 in [facility] Video Visit from 03/01/2021 in [facility] Video Visit from  09/21/2020 in [facility] Video Visit from 10/14/2019 in [facility]  PHQ-2 Total Score 0 0 0 1 0      Flowsheet Row DG MYELOGRAM from 05/11/2021 in [facility] ED from 12/07/2020 in [facility] Video Visit from 09/21/2020 in [facility]  C-SSRS RISK CATEGORY No Risk No Risk No Risk        Assessment and Plan: Maureen Randall is a 56 year old Caucasian female, married, on SSD, lives in [place], has a history of GAD, insomnia, chronic pain, osteoarthritis, degenerative disc disease, multiple surgeries was evaluated in office today.  Patient is currently anxious about her current medical problems, chronic pain, otherwise doing fairly well, will benefit from the following plan.  Plan GAD-stable Cymbalta 60 mg p.o. daily Hydroxyzine 10 mg p.o. daily as needed for severe anxiety  Insomnia-stable Trazodone 200 mg p.o. nightly as needed Melatonin 3 mg p.o. nightly  Patient will continue to need sufficient pain management.  Follow-up in clinic in 3 months or sooner if needed.   Consent: Patient/Guardian gives verbal consent for treatment and assignment of benefits for services provided during this visit. Patient/Guardian expressed understanding and agreed to proceed.   This note was generated in  part or whole with voice recognition software. Voice recognition is usually quite accurate but there are transcription errors that can and very often do occur. I apologize for any typographical errors that were not detected and corrected.      Jomarie Longs, MD 09/11/2021, 2:18 PM

## 2021-09-12 ENCOUNTER — Ambulatory Visit
Admission: RE | Admit: 2021-09-12 | Discharge: 2021-09-12 | Disposition: A | Discharge status: Home / Self Care | Payer: 59 | Source: Ambulatory Visit | Attending: Internal Medicine | Admitting: Internal Medicine

## 2021-09-12 DIAGNOSIS — Z1231 Encounter for screening mammogram for malignant neoplasm of breast: Secondary | ICD-10-CM | POA: Insufficient documentation

## 2021-10-03 ENCOUNTER — Other Ambulatory Visit: Payer: Self-pay | Admitting: Family

## 2021-10-03 DIAGNOSIS — E669 Obesity, unspecified: Secondary | ICD-10-CM

## 2021-10-03 LAB — LIPID PANEL
Chol/HDL Ratio: 3.6 ratio (ref 0.0–4.4)
Cholesterol, Total: 235 mg/dL — ABNORMAL HIGH (ref 100–199)
HDL: 65 mg/dL (ref >39)
LDL Chol Calc (NIH): 150 mg/dL — ABNORMAL HIGH (ref 0–99)
Triglycerides: 116 mg/dL (ref 0–149)
VLDL Cholesterol Cal: 20 mg/dL (ref 5–40)

## 2021-10-09 ENCOUNTER — Encounter: Payer: Self-pay | Admitting: Internal Medicine

## 2021-10-09 NOTE — Telephone Encounter (Signed)
Okay for referral for second opinion?  ?

## 2021-10-13 ENCOUNTER — Encounter: Payer: Self-pay | Admitting: Internal Medicine

## 2021-10-13 ENCOUNTER — Ambulatory Visit (INDEPENDENT_AMBULATORY_CARE_PROVIDER_SITE_OTHER): Payer: 59 | Admitting: Internal Medicine

## 2021-10-13 VITALS — BP 112/62 | HR 71 | Ht 67.0 in | Wt 209.4 lb

## 2021-10-13 DIAGNOSIS — E785 Hyperlipidemia, unspecified: Secondary | ICD-10-CM

## 2021-10-13 MED ORDER — ROSUVASTATIN CALCIUM 5 MG PO TABS: 5.0000 mg | ORAL_TABLET | Freq: Every day | ORAL | 3 refills | Status: DC

## 2021-10-13 NOTE — Patient Instructions (Signed)
Medication Instructions:  ?START rosuvastatin '5mg'$  daily ?STOP red yeast rice ? ?*If you need a refill on your cardiac medications before your next appointment, please call your pharmacy* ? ? ?Lab Work: ?FASTING lipid panel in about 4 months ? ?If you have labs (blood work) drawn today and your tests are completely normal, you will receive your results only by: ?MyChart Message (if you have MyChart) OR ?A paper copy in the mail ?If you have any lab test that is abnormal or we need to change your treatment, we will call you to review the results. ? ? ?Testing/Procedures: ?NONE ? ? ?Follow-Up: ?At [facility], you and your health needs are our priority.  As part of our continuing mission to provide you with exceptional heart care, we have created designated Provider Care Teams.  These Care Teams include your primary Cardiologist (physician) and Advanced Practice Providers (APPs -  Physician Assistants and Nurse Practitioners) who all work together to provide you with the care you need, when you need it. ? ?We recommend signing up for the patient portal called "MyChart".  Sign up information is provided on this After Visit Summary.  MyChart is used to connect with patients for Virtual Visits (Telemedicine).  Patients are able to view lab/test results, encounter notes, upcoming appointments, etc.  Non-urgent messages can be sent to your provider as well.   ?To learn more about what you can do with MyChart, go to [email or web address].   ? ?Your next appointment:   ?4-6 months with Dr. Debara Pickett  ?-- in-person or video visit ? ?

## 2021-10-13 NOTE — Progress Notes (Signed)
? ? ? ?LIPID CLINIC CONSULT NOTE ? ?Chief Complaint:  ?Follow-up dyslipidemia ? ?Primary Care Physician: ?McLean-Scocuzza, Nino Glow, MD ? ?Primary Cardiologist:  ?Pixie Casino, MD ? ?HPI:  ?Maureen Randall is a 56 y.o. female who is being seen today for the evaluation of dyslipidemia at the request of McLean-Scocuzza, Olivia Mackie *.  This is a pleasant 56 year old female who was kindly referred for evaluation and management of dyslipidemia.  She has few cardiovascular risk factors, especially that she is not diabetic and denies any hypertension or family history of early onset heart disease.  Most recent lipids showed total cholesterol 226, triglycerides 113, HDL 68 and LDL 135.  Her PCP had felt that her targets for cholesterol may need to be lower, particularly total cholesterol below 200 or LDL below 100.  I suspect her cholesterol is partially higher because of a high HDL cholesterol which may be cardioprotective for her.  Although she has a number of other medical problems including a history of recurrent DVT on Xarelto, she does not seem to have a lot of cardiovascular risk.  She does report rather a less than ideal diet.  She says she eats a lot of cheese burgers, cheese and other saturated fat sources which could be contributing to her higher cholesterol.  Her weight is also over ideal for her height. ? ?10/13/2021 ? ?Maureen Randall returns today for follow-up.  Fortunately she had no coronary calcium by CT back in October 2022.  Despite that her cholesterol is gone up higher with LDL now 150.  She has been less active and is required some steroid injections.  She has been having issues with back and leg pain and shoulder pain which will require apparently a shoulder replacement.  She was taking red yeast rice for short period time but did not notice a significant reduction in her cholesterol and is discontinued it.  I advised her not to take that with the statin.  We talked about the pluses and minuses of statin  therapy.  Because she had no coronary calcium that is associated with low risk, however there is some family history of high cholesterol.  She is more in favor of treating her cholesterol since it is worsened and notes that she can be less active.  I think targeting her LDL to less than 100 would be reasonable.   ? ?PMHx:  ?Past Medical History:  ?Diagnosis Date  ? Anxiety   ? Arthritis   ? osteoarthritis-joints, feet, shoulder  ? Asthma   ? previously diagnosed after a bout of bronchitis/ no problems since  ? Bursitis   ? b/l hips  ? Chronic pain syndrome   ? COPD (chronic obstructive pulmonary disease) ([place]) 2004  ? patient states probably bronchitis  ? Depression   ? DVT (deep venous thrombosis) ([place]) 2012  ? right leg. after surgery, while on BCP  ? GERD (gastroesophageal reflux disease)   ? Headache, post-myelogram 09/11/2021  ? Hyperlipidemia   ? Left leg DVT ([place])   ? fem/pop 12/07/20 after right shoulder surgery 11/30/20  ? Neuropathy   ? post surgery  ? PVD (peripheral vascular disease) ([place])   ? Spondylolisthesis   ? ? ?Past Surgical History:  ?Procedure Laterality Date  ? ABLATION    ? veins in lower legs  ? BACK SURGERY  07/2014  ? lumbar spine/ fusion 1 level  ? BREAST BIOPSY Left 2011  ? neg  ? CARPAL TUNNEL RELEASE Right 12/2015  ? [facility]  ?  CARPAL TUNNEL RELEASE Left 12/2016  ? COLONOSCOPY WITH PROPOFOL N/A 02/29/2020  ? Procedure: COLONOSCOPY WITH PROPOFOL;  Surgeon: [name], MD;  Location: [place];  Service: Endoscopy;  Laterality: N/A;  ? ESOPHAGOGASTRODUODENOSCOPY (EGD) WITH PROPOFOL N/A 02/29/2020  ? Procedure: ESOPHAGOGASTRODUODENOSCOPY (EGD) WITH PROPOFOL;  Surgeon: [name], MD;  Location: [place];  Service: Endoscopy;  Laterality: N/A;  ? FOOT SURGERY Right 2015  ? broken navicular bone  ? HIP ARTHROSCOPY Right 05/2011  ? HIP SURGERY    ? left hip surgery 05/2018  ? JOINT REPLACEMENT Bilateral 12/2017 & 05/2018  ? hip  ? L5-S1 OLIF Dr. Rennis Harding back surgery  09/2019  09/28/2019  ? left ganglion cyst removal    ? 12/23/19 Dr. Elvina Mattes  ? MASS EXCISION Left 12/17/2019  ? Procedure: EXCISION TUMOR FOOT DEEP LEFT;  Surgeon: Albertine Patricia, DPM;  Location: [place];  Service: Podiatry;  Laterality: Left;  LMA W/LOCAL  ? NASAL TURBINATE REDUCTION Bilateral 06/12/2019  ? Procedure: TURBINATE REDUCTION/SUBMUCOSAL RESECTION;  Surgeon: [name], MD;  Location: [place];  Service: ENT;  Laterality: Bilateral;  ? REPLACEMENT TOTAL HIP W/  RESURFACING IMPLANTS Right 12/2017  ? [facility]  ? right shoulder surgery    ? 11/28/20 for bone spur, torn labrum, re attached biceps tendon, tear Dr. [name]  ? ROTATOR CUFF REPAIR    ? Dr. [name] in 04/2019   ? SCLEROTHERAPY    ? veins in lower legs  ? SEPTOPLASTY Bilateral 06/12/2019  ? Procedure: SEPTOPLASTY;  Surgeon: [name], MD;  Location: [place];  Service: ENT;  Laterality: Bilateral;  ? SHOULDER SURGERY Right 12/2015  ? [facility] torn rotator cuff  ? SPINAL FUSION  2016  ? [facility] lumbar  ? ? ?FAMHx:  ?Family History  ?Problem Relation Age of Onset  ? Breast cancer Maternal Aunt   ? Breast cancer Paternal Aunt   ? Dementia Paternal Aunt   ? COPD Mother   ?     quit smoking after 60 years  ? Arthritis Mother   ? Osteoarthritis Mother   ? Dementia Father   ?     symptoms started in his 40s  ? Alzheimer's disease Father   ? Rheum arthritis Father   ? Rheum arthritis Other   ?     FH  ? Dementia Other   ?     "all my dad's side of the family"  ? Other Maternal Grandmother   ?     got dementia as she got older   ? Dementia Paternal Grandmother   ? ? ?SOCHx:  ? reports that she has never smoked. She has never used smokeless tobacco. She reports current alcohol use of about 1.0 standard drink per week. She reports that she does not use drugs. ? ?ALLERGIES:  ?Allergies  ?Allergen Reactions  ? Amoxicillin Hives  ? Septra [Sulfamethoxazole-Trimethoprim] Hives  ?     ? Sulfamethoxazole   ?  Tizanidine Other (See Comments)  ?  Weak muscles ? ?Weak muscles  ? Trimethoprim   ? Cephalexin Rash  ?   ?  ? ? ?ROS: ?Pertinent items noted in HPI and remainder of comprehensive ROS otherwise negative. ? ?HOME MEDS: ?Current Outpatient Medications on File Prior to Visit  ?Medication Sig Dispense Refill  ? acetaminophen (TYLENOL) 650 MG CR tablet Take 1,300 mg by mouth every 8 (eight) hours as needed for pain.    ? baclofen (LIORESAL) 10 MG tablet  Take 10 mg by mouth 3 (three) times daily as needed for muscle spasms.    ? BELBUCA 450 MCG FILM Take 450 mcg by mouth every 12 (twelve) hours as needed (pain).    ? Buprenorphine HCl (BELBUCA) 450 MCG FILM Place inside cheek 2 (two) times daily as needed.    ? cetirizine (ZYRTEC) 10 MG tablet Take 10 mg by mouth daily as needed for allergies.    ? dexlansoprazole (DEXILANT) 60 MG capsule TAKE 1 CAPSULE BY MOUTH EVERY DAY 90 capsule 1  ? diclofenac Sodium (VOLTAREN) 1 % GEL APPLY 2 GRAMS TO UPPER BODY FOUR TIMES DAILY AS NEEDED AND 4 GRAMS TO LOWER BODY FOUR TIMES DAILY AS NEEDED 100 g 11  ? DULoxetine (CYMBALTA) 60 MG capsule TAKE 1 CAPSULE BY MOUTH EVERY DAY 90 capsule 1  ? Elastic Bandages & Supports (RELIEF KNEE) MISC Apply in the morning and remove at night.    ? estradiol (ESTRACE) 0.1 MG/GM vaginal cream Place 1 Applicatorful vaginally every 14 (fourteen) days.    ? famotidine (PEPCID) 20 MG tablet TAKE 1 TABLET BY MOUTH EVERYDAY AT BEDTIME 90 tablet 3  ? fluticasone (FLONASE) 50 MCG/ACT nasal spray SMARTSIG:1-2 Spray(s) Both Nares Daily PRN    ? hydrOXYzine (ATARAX/VISTARIL) 10 MG tablet Take 1 tablet (10 mg total) by mouth daily as needed for anxiety. 90 tablet 1  ? linaclotide (LINZESS) 145 MCG CAPS capsule Take 1 capsule (145 mcg total) by mouth daily before breakfast. 30 capsule 0  ? ondansetron (ZOFRAN-ODT) 8 MG disintegrating tablet Take 1 tablet (8 mg total) by mouth every 8 (eight) hours as needed for nausea or vomiting. 30 tablet 2  ?  oxyCODONE-acetaminophen (PERCOCET) 10-325 MG tablet Take 1 tablet by mouth every 6 (six) hours as needed for pain.    ? phentermine (ADIPEX-P) 37.5 MG tablet TAKE 1 TABLET(37.5 MG) BY MOUTH DAILY BEFORE BREAKFAST 60 tablet 0

## 2021-10-17 ENCOUNTER — Encounter: Payer: Self-pay | Admitting: Internal Medicine

## 2021-10-24 ENCOUNTER — Encounter: Payer: Self-pay | Admitting: Internal Medicine

## 2021-11-06 MED ORDER — EZETIMIBE 10 MG PO TABS: 10.0000 mg | ORAL_TABLET | Freq: Every day | ORAL | 3 refills | Status: DC

## 2021-11-12 ENCOUNTER — Other Ambulatory Visit: Payer: Self-pay | Admitting: Psychiatry

## 2021-11-12 DIAGNOSIS — F411 Generalized anxiety disorder: Secondary | ICD-10-CM

## 2021-11-12 DIAGNOSIS — G4701 Insomnia due to medical condition: Secondary | ICD-10-CM

## 2021-11-16 ENCOUNTER — Encounter: Payer: 59 | Attending: Physician Assistant | Admitting: Physician Assistant

## 2021-11-16 DIAGNOSIS — L97822 Non-pressure chronic ulcer of other part of left lower leg with fat layer exposed: Secondary | ICD-10-CM | POA: Insufficient documentation

## 2021-11-16 DIAGNOSIS — Z7901 Long term (current) use of anticoagulants: Secondary | ICD-10-CM | POA: Diagnosis not present

## 2021-11-16 DIAGNOSIS — Z86718 Personal history of other venous thrombosis and embolism: Secondary | ICD-10-CM | POA: Diagnosis not present

## 2021-11-16 DIAGNOSIS — Y838 Other surgical procedures as the cause of abnormal reaction of the patient, or of later complication, without mention of misadventure at the time of the procedure: Secondary | ICD-10-CM | POA: Diagnosis not present

## 2021-11-16 DIAGNOSIS — T8131XA Disruption of external operation (surgical) wound, not elsewhere classified, initial encounter: Secondary | ICD-10-CM | POA: Diagnosis not present

## 2021-11-21 NOTE — Progress Notes (Signed)
KRISINDA, GIOVANNI A. ([id number]) ?Visit Report for 11/16/2021 ?Allergy List Details ?Patient Name: Maureen Randall ?Date of Service: 11/16/2021 8:45 AM ?Medical Record Number: [id number] ?Patient Account Number: [id number] ?Date of Birth/Sex: [birth year] (56 y.o. F) ?Treating RN: Carlene Coria ?Primary Care Denario Bagot: McLean-Scocuzza, Olivia Mackie Other Clinician: ?Referring Ilah Boule: Referral, Self ?Treating Adrielle Polakowski/Extender: Jeri Cos ?Weeks in Treatment: 0 ?Allergies ?Active Allergies ?amoxicillin ?Sulfa (Sulfonamide Antibiotics) ?cephalexin ?Allergy Notes ?Electronic Signature(s) ?Signed: 11/17/2021 8:03:27 AM By: Worthy Keeler PA-C ?Entered By: Worthy Keeler on 11/16/2021 09:01:38 ?MYKALA, MCCREADY A. ([id number]) ?-------------------------------------------------------------------------------- ?Arrival Information Details ?Patient Name: Maureen Randall ?Date of Service: 11/16/2021 8:45 AM ?Medical Record Number: [id number] ?Patient Account Number: [id number] ?Date of Birth/Sex: 1966-01-25 (56 y.o. F) ?Treating RN: Carlene Coria ?Primary Care Leeta Grimme: McLean-Scocuzza, Olivia Mackie Other Clinician: ?Referring Lenetta Piche: Referral, Self ?Treating Adalie Mand/Extender: Jeri Cos ?Weeks in Treatment: 0 ?Visit Information ?Patient Arrived: Kasandra Knudsen ?Arrival Time: 08:55 ?Accompanied By: self ?Transfer Assistance: None ?Patient Identification Verified: Yes ?Secondary Verification Process Completed: Yes ?Patient Requires Transmission-Based Precautions: No ?Patient Has Alerts: No ?History Since Last Visit ?All ordered tests and consults were completed: No ?Added or deleted any medications: No ?Any new allergies or adverse reactions: No ?Had a fall or experienced change in activities of daily living that may affect risk of falls: No ?Signs or symptoms of abuse/neglect since last visito No ?Hospitalized since last visit: No ?Implantable device outside of the clinic excluding cellular tissue based products placed in the center since last visit: No ?Has  Dressing in Place as Prescribed: Yes ?Pain Present Now: No ?Electronic Signature(s) ?Signed: 11/17/2021 8:03:27 AM By: Worthy Keeler PA-C ?Entered By: Worthy Keeler on 11/16/2021 08:57:18 ?JAQUILLA, WOODROOF A. ([id number]) ?-------------------------------------------------------------------------------- ?Clinic Level of Care Assessment Details ?Patient Name: Maureen Randall ?Date of Service: 11/16/2021 8:45 AM ?Medical Record Number: [id number] ?Patient Account Number: [id number] ?Date of Birth/Sex: 14-May-1966 (56 y.o. F) ?Treating RN: Carlene Coria ?Primary Care Jazline Cumbee: McLean-Scocuzza, Olivia Mackie Other Clinician: ?Referring Keylee Shrestha: Referral, Self ?Treating Deone Leifheit/Extender: Jeri Cos ?Weeks in Treatment: 0 ?Clinic Level of Care Assessment Items ?TOOL 2 Quantity Score ?X - Use when only an EandM is performed on the INITIAL visit 1 0 ?ASSESSMENTS - Nursing Assessment / Reassessment ?X - General Physical Exam (combine w/ comprehensive assessment (listed just below) when performed on new ?1 20 ?pt. evals) ?X- 1 25 ?Comprehensive Assessment (HX, ROS, Risk Assessments, Wounds Hx, etc.) ?ASSESSMENTS - Wound and Skin Assessment / Reassessment ?'[]'$  - Simple Wound Assessment / Reassessment - one wound 0 ?'[]'$  - 0 ?Complex Wound Assessment / Reassessment - multiple wounds ?'[]'$  - 0 ?Dermatologic / Skin Assessment (not related to wound area) ?ASSESSMENTS - Ostomy and/or Continence Assessment and Care ?'[]'$  - Incontinence Assessment and Management 0 ?'[]'$  - 0 ?Ostomy Care Assessment and Management (repouching, etc.) ?PROCESS - Coordination of Care ?X - Simple Patient / Family Education for ongoing care 1 15 ?'[]'$  - 0 ?Complex (extensive) Patient / Family Education for ongoing care ?X- 1 10 ?Staff obtains Consents, Records, Test Results / Process Orders ?'[]'$  - 0 ?Staff telephones HHA, Nursing Homes / Clarify orders / etc ?'[]'$  - 0 ?Routine Transfer to another Facility (non-emergent condition) ?'[]'$  - 0 ?Routine Hospital Admission (non-emergent  condition) ?X- 1 15 ?New Admissions / Biomedical engineer / Ordering NPWT, Apligraf, etc. ?'[]'$  - 0 ?Emergency Hospital Admission (emergent condition) ?X- 1 10 ?Simple Discharge Coordination ?'[]'$  - 0 ?Complex (extensive) Discharge Coordination ?PROCESS - Special Needs ?'[]'$  - Pediatric / Minor Patient Management 0 ?'[]'$  - 0 ?Isolation Patient Management ?'[]'$  -  0 ?Hearing / Language / Visual special needs ?'[]'$  - 0 ?Assessment of Community assistance (transportation, D/C planning, etc.) ?'[]'$  - 0 ?Additional assistance / Altered mentation ?'[]'$  - 0 ?Support Surface(s) Assessment (bed, cushion, seat, etc.) ?INTERVENTIONS - Wound Cleansing / Measurement ?'[]'$  - Wound Imaging (photographs - any number of wounds) 0 ?'[]'$  - 0 ?Wound Tracing (instead of photographs) ?'[]'$  - 0 ?Simple Wound Measurement - one wound ?'[]'$  - 0 ?Complex Wound Measurement - multiple wounds ?EVANIE, BUCKLE A. ([id number]) ?'[]'$  - 0 ?Simple Wound Cleansing - one wound ?'[]'$  - 0 ?Complex Wound Cleansing - multiple wounds ?INTERVENTIONS - Wound Dressings ?'[]'$  - Small Wound Dressing one or multiple wounds 0 ?'[]'$  - 0 ?Medium Wound Dressing one or multiple wounds ?'[]'$  - 0 ?Large Wound Dressing one or multiple wounds ?'[]'$  - 0 ?Application of Medications - injection ?INTERVENTIONS - Miscellaneous ?'[]'$  - External ear exam 0 ?'[]'$  - 0 ?Specimen Collection (cultures, biopsies, blood, body fluids, etc.) ?'[]'$  - 0 ?Specimen(s) / Culture(s) sent or taken to Lab for analysis ?'[]'$  - 0 ?Patient Transfer (multiple staff / Civil Service fast streamer / Similar devices) ?'[]'$  - 0 ?Simple Staple / Suture removal (25 or less) ?'[]'$  - 0 ?Complex Staple / Suture removal (26 or more) ?'[]'$  - 0 ?Hypo / Hyperglycemic Management (close monitor of Blood Glucose) ?'[]'$  - 0 ?Ankle / Brachial Index (ABI) - do not check if billed separately ?Has the patient been seen at the hospital within the last three years: Yes ?Total Score: 95 ?Level Of Care: New/Established - Level ?3 ?Electronic Signature(s) ?Signed: 11/17/2021 8:03:27 AM By:  Worthy Keeler PA-C ?Entered By: Worthy Keeler on 11/16/2021 09:36:52 ?EDRIE, EHRICH A. ([id number]) ?-------------------------------------------------------------------------------- ?Encounter Discharge Information Details ?Patient Name: Maureen Randall ?Date of Service: 11/16/2021 8:45 AM ?Medical Record Number: [id number] ?Patient Account Number: [id number] ?Date of Birth/Sex: [birth year] (56 y.o. F) ?Treating RN: Carlene Coria ?Primary Care Kemp Gomes: McLean-Scocuzza, Olivia Mackie Other Clinician: ?Referring Jakerria Kingbird: Referral, Self ?Treating Zayana Salvador/Extender: Jeri Cos ?Weeks in Treatment: 0 ?Encounter Discharge Information Items ?Discharge Condition: Stable ?Ambulatory Status: Ambulatory ?Discharge Destination: Home ?Transportation: Private Auto ?Accompanied By: self ?Schedule Follow-up Appointment: Yes ?Clinical Summary of Care: Patient Declined ?Electronic Signature(s) ?Signed: 11/17/2021 8:03:27 AM By: Worthy Keeler PA-C ?Entered By: Worthy Keeler on 11/16/2021 09:37:43 ?KORISSA, HORSFORD A. ([id number]) ?-------------------------------------------------------------------------------- ?Multi Wound Chart Details ?Patient Name: Maureen Randall ?Date of Service: 11/16/2021 8:45 AM ?Medical Record Number: [id number] ?Patient Account Number: [id number] ?Date of Birth/Sex: 08-21-1965 (56 y.o. F) ?Treating RN: Carlene Coria ?Primary Care Bret Vanessen: McLean-Scocuzza, Olivia Mackie Other Clinician: ?Referring Brandilee Pies: Referral, Self ?Treating Cleto Claggett/Extender: Jeri Cos ?Weeks in Treatment: 0 ?Vital Signs ?Height(in): 67 ?Pulse(bpm): 68 ?Weight(lbs): 212 ?Blood Pressure(mmHg): 116/78 ?Body Mass Index(BMI): 33.2 ?Temperature(??F): 98.2 ?Respiratory Rate(breaths/min): 18 ?Wound Assessments ?Treatment Notes ?Electronic Signature(s) ?Signed: 11/17/2021 8:03:27 AM By: Worthy Keeler PA-C ?Entered By: Worthy Keeler on 11/16/2021 09:35:03 ?TASHAWNDA, BLEILER A.  ([id number]) ?-------------------------------------------------------------------------------- ?Multi-Disciplinary Care Plan Details ?Patient Name: Maureen Randall ?Date of Service: 11/16/2021 8:45 AM ?Medical Record Number: [id number] ?Patient Account Number: [id number] ?Date of Birth/Sex: [birth year]

## 2021-11-21 NOTE — Progress Notes (Signed)
TANAYA, DUNIGAN A. ([id number]) ?Visit Report for 11/16/2021 ?Abuse Risk Screen Details ?Patient Name: [name] ?Date of Service: 11/16/2021 8:45 AM ?Medical Record Number: [id number] ?Patient Account Number: [id number] ?Date of Birth/Sex: 1966-01-09 (56 y.o. F) ?Treating RN: Carlene Coria ?Primary Care Tykisha Areola: McLean-Scocuzza, Olivia Mackie Other Clinician: ?Referring Illianna Paschal: Referral, Self ?Treating Deuntae Kocsis/Extender: Jeri Cos ?Weeks in Treatment: 0 ?Abuse Risk Screen Items ?Answer ?ABUSE RISK SCREEN: ?Has anyone close to you tried to hurt or harm you recentlyo No ?Do you feel uncomfortable with anyone in your familyo No ?Has anyone forced you do things that you didnot want to doo No ?Electronic Signature(s) ?Signed: 11/17/2021 8:03:27 AM By: Worthy Keeler PA-C ?Signed: 11/21/2021 10:35:39 AM By: Carlene Coria RN ?Entered By: Worthy Keeler on 11/16/2021 09:02:07 ?ADALEA, HANDLER A. ([id number]) ?-------------------------------------------------------------------------------- ?Activities of Daily Living Details ?Patient Name: [name] ?Date of Service: 11/16/2021 8:45 AM ?Medical Record Number: [id number] ?Patient Account Number: [id number] ?Date of Birth/Sex: Dec 05, 1965 (56 y.o. F) ?Treating RN: Carlene Coria ?Primary Care Nikodem Leadbetter: McLean-Scocuzza, Olivia Mackie Other Clinician: ?Referring Burnard Enis: Referral, Self ?Treating Kamalei Roeder/Extender: Jeri Cos ?Weeks in Treatment: 0 ?Activities of Daily Living Items ?Answer ?Activities of Daily Living (Please select one for each item) ?[place] ?Take Medications Completely Able ?Use Telephone Completely Able ?Care for Appearance Completely Able ?Use Toilet Completely Able ?Bath / Shower Completely Able ?Dress Self Completely Able ?Feed Self Completely Able ?Walk Completely Able ?Get In / Out Bed Completely Able ?Housework Completely Able ?Prepare Meals Completely Able ?Handle Money Completely Able ?Shop for Self Completely Able ?Electronic  Signature(s) ?Signed: 11/17/2021 8:03:27 AM By: Worthy Keeler PA-C ?Signed: 11/21/2021 10:35:39 AM By: Carlene Coria RN ?Entered By: Worthy Keeler on 11/16/2021 09:02:35 ?RAMYAH, PANKOWSKI A. ([id number]) ?-------------------------------------------------------------------------------- ?Education Screening Details ?Patient Name: [name] ?Date of Service: 11/16/2021 8:45 AM ?Medical Record Number: [id number] ?Patient Account Number: [id number] ?Date of Birth/Sex: [birth year] (56 y.o. F) ?Treating RN: Carlene Coria ?Primary Care Bronnie Vasseur: McLean-Scocuzza, Olivia Mackie Other Clinician: ?Referring Kiora Hallberg: Referral, Self ?Treating Darick Fetters/Extender: Jeri Cos ?Weeks in Treatment: 0 ?Primary Learner Assessed: Patient ?Learning Preferences/Education Level/Primary Language ?Learning Preference: Explanation ?Highest Education Level: High School ?Preferred Language: English ?Cognitive Barrier ?Language Barrier: No ?Translator Needed: No ?Memory Deficit: No ?Emotional Barrier: No ?Cultural/Religious Beliefs Affecting Medical Care: No ?Physical Barrier ?Impaired Vision: Yes Glasses ?Impaired Hearing: No ?Decreased Hand dexterity: No ?Knowledge/Comprehension ?Knowledge Level: Medium ?Comprehension Level: High ?Ability to understand written instructions: High ?Ability to understand verbal instructions: High ?Motivation ?Anxiety Level: Anxious ?Cooperation: Cooperative ?Education Importance: Acknowledges Need ?Interest in Health Problems: Asks Questions ?Perception: Coherent ?Willingness to Engage in Self-Management ?High ?Activities: ?Readiness to Engage in Self-Management ?High ?Activities: ?Electronic Signature(s) ?Signed: 11/17/2021 8:03:27 AM By: Worthy Keeler PA-C ?Signed: 11/21/2021 10:35:39 AM By: Carlene Coria RN ?Entered By: Worthy Keeler on 11/16/2021 09:03:21 ?RAYSSA, ATHA A. ([id number]) ?-------------------------------------------------------------------------------- ?Fall Risk Assessment Details ?Patient Name: [name] ?Date of Service: 11/16/2021 8:45 AM ?Medical Record Number: [id number] ?Patient Account Number: [id number] ?Date of Birth/Sex: 1966/05/06 (56 y.o. F) ?Treating RN: Carlene Coria ?Primary Care Rayli Wiederhold: McLean-Scocuzza, Olivia Mackie Other Clinician: ?Referring Ramsie Ostrander: Referral, Self ?Treating Alexavier Tsutsui/Extender: Jeri Cos ?Weeks in Treatment: 0 ?Fall Risk Assessment Items ?Have you had 2 or more falls in the last 12 monthso 0 Yes ?Have you had any fall that resulted in injury in the last 12 monthso 0 Yes ?FALLS RISK SCREEN ?History of falling - immediate or within 3 months 0 No ?Secondary diagnosis (Do you have 2 or more medical diagnoseso) 0  No ?Ambulatory aid ?None/bed rest/wheelchair/nurse 0 No ?Crutches/cane/walker 0 No ?Furniture 0 No ?Intravenous therapy Access/Saline/Heparin Lock 0 No ?Gait/Transferring ?Normal/ bed rest/ wheelchair 0 No ?Weak (short steps with or without shuffle, stooped but able to lift head while walking, may ?0 No ?seek support from furniture) ?Impaired (short steps with shuffle, may have difficulty arising from chair, head down, impaired ?0 No ?balance) ?Mental Status ?Oriented to own ability 0 No ?Electronic Signature(s) ?Signed: 11/17/2021 8:03:27 AM By: Worthy Keeler PA-C ?Signed: 11/21/2021 10:35:39 AM By: Carlene Coria RN ?Entered By: Worthy Keeler on 11/16/2021 09:03:42 ?MAYLIN, FREEBURG A. ([id number]) ?-------------------------------------------------------------------------------- ?Foot Assessment Details ?Patient Name: [name] ?Date of Service: 11/16/2021 8:45 AM ?Medical Record Number: [id number] ?Patient Account Number: [id number] ?Date of Birth/Sex: 08/12/1965 (56 y.o. F) ?Treating RN: Carlene Coria ?Primary Care Johanny Segers: McLean-Scocuzza, Olivia Mackie Other Clinician: ?Referring Annalissa Murphey: Referral, Self ?Treating Izmael Duross/Extender: Jeri Cos ?Weeks in Treatment: 0 ?Foot Assessment Items ?Site Locations ?+ = Sensation present, - = Sensation absent, C = Callus, U = Ulcer ?R =  Redness, W = Warmth, M = Maceration, PU = Pre-ulcerative lesion ?F = Fissure, S = Swelling, D = Dryness ?Assessment ?Right: Left: ?Other Deformity: No No ?Prior Foot Ulcer: No No ?Prior Amputation: No No ?Charcot Joint: No No ?Ambulatory Status: ?Gait: ?Electronic Signature(s) ?Signed: 11/17/2021 8:03:27 AM By: Worthy Keeler PA-C ?Signed: 11/21/2021 10:35:39 AM By: Carlene Coria RN ?Entered By: Worthy Keeler on 11/16/2021 09:08:40 ?SHERESE, HEYWARD A. ([id number]) ?-------------------------------------------------------------------------------- ?Nutrition Risk Screening Details ?Patient Name: [name] ?Date of Service: 11/16/2021 8:45 AM ?Medical Record Number: [id number] ?Patient Account Number: [id number] ?Date of Birth/Sex: 09-17-1965 (56 y.o. F) ?Treating RN: Carlene Coria ?Primary Care Marleta Lapierre: McLean-Scocuzza, Olivia Mackie Other Clinician: ?Referring Kennith Morss: Referral, Self ?Treating Adan Beal/Extender: Jeri Cos ?Weeks in Treatment: 0 ?Height (in): 67 ?Weight (lbs): 212 ?Body Mass Index (BMI): 33.2 ?Nutrition Risk Screening Items ?Score Screening ?NUTRITION RISK SCREEN: ?I have an illness or condition that made me change the kind and/or amount of food I eat 0 No ?I eat fewer than two meals per day 0 No ?I eat few fruits and vegetables, or milk products 0 No ?I have three or more drinks of beer, liquor or wine almost every day 0 No ?I have tooth or mouth problems that make it hard for me to eat 0 No ?I don't always have enough money to buy the food I need 0 No ?I eat alone most of the time 0 No ?I take three or more different prescribed or over-the-counter drugs a day 1 Yes ?Without wanting to, I have lost or gained 10 pounds in the last six months 0 No ?I am not always physically able to shop, cook and/or feed myself 0 No ?Nutrition Protocols ?Good Risk Protocol ?Moderate Risk Protocol ?High Risk Proctocol ?Risk Level: Good Risk ?Score: 1 ?Electronic Signature(s) ?Signed: 11/17/2021 8:03:27 AM By: Worthy Keeler  PA-C ?Signed: 11/21/2021 10:35:39 AM By: Carlene Coria RN ?Entered By: Worthy Keeler on 11/16/2021 09:03:54 ?

## 2021-11-21 NOTE — Progress Notes (Signed)
SHENEA, GIACOBBE A. ([id number]) ?Visit Report for 11/16/2021 ?Chief Complaint Document Details ?Patient Name: Maureen Randall ?Date of Service: 11/16/2021 8:45 AM ?Medical Record Number: [id number] ?Patient Account Number: [id number] ?Date of Birth/Sex: 1966-03-10 (56 y.o. F) ?Treating RN: Carlene Coria ?Primary Care Provider: McLean-Scocuzza, Olivia Mackie Other Clinician: ?Referring Provider: Referral, Self ?Treating Provider/Extender: Jeri Cos ?Weeks in Treatment: 0 ?Information Obtained from: Patient ?Chief Complaint ?Left lateral LE Surgical Ulcer ?Electronic Signature(s) ?Signed: 11/16/2021 9:30:21 AM By: Worthy Keeler PA-C ?Entered By: Worthy Keeler on 11/16/2021 09:30:21 ?MAGIE, CIAMPA A. ([id number]) ?-------------------------------------------------------------------------------- ?HPI Details ?Patient Name: Maureen Randall ?Date of Service: 11/16/2021 8:45 AM ?Medical Record Number: [id number] ?Patient Account Number: [id number] ?Date of Birth/Sex: Aug 27, 1965 (56 y.o. F) ?Treating RN: Carlene Coria ?Primary Care Provider: McLean-Scocuzza, Olivia Mackie Other Clinician: ?Referring Provider: Referral, Self ?Treating Provider/Extender: Jeri Cos ?Weeks in Treatment: 0 ?History of Present Illness ?HPI Description: 08/04/2021 patient presents today for initial inspection here in our clinic concerning a DC hist surgical wound over the left lower ?leg close to the ankle region which actually originally occurred on 08 May 2021. This was a surgery in regard to the peroneal nerve in order to ?diminish pain in the ankle and foot region from what I am understanding. Nonetheless the patient had a unfortunate course following the surgery ?I went through the pictures she actually had a very good chronology and pictures for me to review which did actually enlightened a lot of what ?was going on during the time. It appears that she initially appeared to be healing and then had an area of necrotic tissue that developed and then ?slowly this  began to soften it became more slough as opposed to eschar and then actually had a fairly deep hole in the center part. My suspicion is ?that she either had a seroma or else a hematoma that formed underneath causing some necrotic damage to the underlying structure. Nonetheless ?following the pictures it does appear that this actually healed pretty well and started to fill in and overall was pretty good even up to a week ago. ?However then she did put some DuoDERM over top of it at their suggestion she has been going to the [facility] and subsequently ?this caused it to break open as it can get rolled up and bunched up causing some damage here. Otherwise the patient's wound does not appear to ?be showing any signs of infection and today it actually looks pretty good. She does question why there is such a dark discoloration where the scar ?areas and I did give her some information on that as well as far as what happens sometimes when scars form. Nonetheless overall I really feel like ?that she is not doing too poorly and I am hopeful this is pretty much about healed for her. ?She does have a history of long-term use of anticoagulants due to a deep vein thrombosis which has actually resolved currently that was actually ?just reevaluated this week and was negative finally. Hopefully she will be off of the blood thinner soon. Otherwise she has no other major medical ?problems other than some chronic pain issues. ?08/21/2021 upon evaluation today patient appears to be doing well with regard to her wound. In fact this appears to be completely healed which is ?great news. Fortunately I do not see any signs of active infection locally or systemically at this time which is also great news. ?Readmission: ?11-16-2021 upon evaluation today patient appears to be doing decently well although she was  very concerned about the possibility of this wound ?having reopened. She in fact showed me pictures and indeed had reopened  on her in the interim since I last saw her. She tells me that she has ?been massaging the area in the middle of the wound where there is a small nodule of scar tissue. Subsequently this I believe may have caused ?this to reopen. Fortunately I do not see any evidence of infection which is great news and in fact I do not even see anything open today which is ?even better news. Nonetheless I do believe that the patient was appropriate and coming in to be seen in order to make sure nothing was ?worsening. I am glad we were able to get her in and reassure her. ?Electronic Signature(s) ?Signed: 11/17/2021 7:54:28 AM By: Worthy Keeler PA-C ?Entered By: Worthy Keeler on 11/17/2021 07:54:28 ?JONIA, OAKEY A. ([id number]) ?-------------------------------------------------------------------------------- ?Physical Exam Details ?Patient Name: Maureen Randall ?Date of Service: 11/16/2021 8:45 AM ?Medical Record Number: [id number] ?Patient Account Number: [id number] ?Date of Birth/Sex: 10/29/1965 (56 y.o. F) ?Treating RN: Carlene Coria ?Primary Care Provider: McLean-Scocuzza, Olivia Mackie Other Clinician: ?Referring Provider: Referral, Self ?Treating Provider/Extender: Jeri Cos ?Weeks in Treatment: 0 ?Constitutional ?Well-nourished and well-hydrated in no acute distress. ?Respiratory ?normal breathing without difficulty. ?Psychiatric ?this patient is able to make decisions and demonstrates good insight into disease process. Alert and Oriented x 3. pleasant and cooperative. ?Notes ?Upon inspection patient does not appear to have any significant breakdown over the area of her previous wound. This in the past has been an ?issue right now seems to be doing quite well in my opinion. I do not see any evidence of active infection locally nor systemically. ?Electronic Signature(s) ?Signed: 11/17/2021 7:54:48 AM By: Worthy Keeler PA-C ?Entered By: Worthy Keeler on 11/17/2021 07:54:48 ?DEIJA, BUHRMAN A.  ([id number]) ?-------------------------------------------------------------------------------- ?Physician Orders Details ?Patient Name: Maureen Randall ?Date of Service: 11/16/2021 8:45 AM ?Medical Record Number: [id number] ?Patient Account Number: [id number] ?Date of Birth/Sex: Jun 11, 1966 (56 y.o. 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 ([id number]) ?-------------------------------------------------------------------------------- ?Problem List Details ?Patient Name: Maureen Randall ?Date of Service: 11/16/2021 8:45 AM ?Medical Record Number: [id number] ?Patient Account Number: [id number] ?Date of Birth/Sex: 12/17/1965 (56 y.o. F) ?Treating RN: Carlene Coria ?Primary Care Provider: McLean-Scocuzza, Olivia Mackie Other Clinician: ?Referring Provider: Referral, Self ?Treating Provider/Extender: Jeri Cos ?Weeks in Treatment: 0 ?Active Problems ?ICD-10 ?Encounter ?Code Description Active Date MDM ?Diagnosis ?T81.31XA Disruption of external operation (surgical) wound, not elsewhere 11/16/2021 No Yes ?classified, initial encounter ?Z22.482 Non-pressure chronic ulcer of other part of left lower leg with fat layer 11/16/2021 No Yes ?exposed ?N00.370 Personal history of other venous thrombosis  and embolism 11/16/2021 No Yes ?Z79.01 Long term (current) use of anticoagulants 11/16/2021 No Yes ?Inactive Problems ?Resolved Problems ?Electronic Signature(s) ?Signed: 11/16/2021 9:30:10 AM By: Worthy Keeler PA-C ?Entered By: Melburn Hake,

## 2021-12-06 NOTE — H&P (Signed)
Patient's anticipated LOS is less than 2 midnights, meeting these requirements: - Younger than 72 - Lives within 1 hour of care - Has a competent adult at home to recover with post-op recover - NO history of  - Chronic pain requiring opiods  - Diabetes  - Coronary Artery Disease  - Heart failure  - Heart attack  - Stroke  - DVT/VTE  - Cardiac arrhythmia  - Respiratory Failure/COPD  - Renal failure  - Anemia  - Advanced Liver disease     Maureen Randall is an 56 y.o. female.    Chief Complaint: right shoulder pain  HPI: Pt is a 56 y.o. female complaining of right shoulder pain for multiple years. Pain had continually increased since the beginning. X-rays in the clinic show end-stage arthritic changes of the right shoulder. Pt has tried various conservative treatments which have failed to alleviate their symptoms, including injections and therapy. Various options are discussed with the patient. Risks, benefits and expectations were discussed with the patient. Patient understand the risks, benefits and expectations and wishes to proceed with surgery.   PCP:  McLean-Scocuzza, Nino Glow, MD  D/C Plans: Home  PMH: Past Medical History:  Diagnosis Date   Anxiety    Arthritis    osteoarthritis-joints, feet, shoulder   Asthma    previously diagnosed after a bout of bronchitis/ no problems since   Bursitis    b/l hips   Chronic pain syndrome    COPD (chronic obstructive pulmonary disease) ([place]) 2004   patient states probably bronchitis   Depression    DVT (deep venous thrombosis) ([place]) 2012   right leg. after surgery, while on BCP   GERD (gastroesophageal reflux disease)    Headache, post-myelogram 09/11/2021   Hyperlipidemia    Left leg DVT ([place])    fem/pop 12/07/20 after right shoulder surgery 11/30/20   Neuropathy    post surgery   PVD (peripheral vascular disease) ([place])    Spondylolisthesis     PSH: Past Surgical History:  Procedure Laterality Date   ABLATION      veins in lower legs   BACK SURGERY  07/2014   lumbar spine/ fusion 1 level   BREAST BIOPSY Left 2011   neg   CARPAL TUNNEL RELEASE Right 12/2015   [facility]   CARPAL TUNNEL RELEASE Left 12/2016   COLONOSCOPY WITH PROPOFOL N/A 02/29/2020   Procedure: COLONOSCOPY WITH PROPOFOL;  Surgeon: [name], MD;  Location: [place];  Service: Endoscopy;  Laterality: N/A;   ESOPHAGOGASTRODUODENOSCOPY (EGD) WITH PROPOFOL N/A 02/29/2020   Procedure: ESOPHAGOGASTRODUODENOSCOPY (EGD) WITH PROPOFOL;  Surgeon: [name], MD;  Location: [place];  Service: Endoscopy;  Laterality: N/A;   FOOT SURGERY Right 2015   broken navicular bone   HIP ARTHROSCOPY Right 05/2011   HIP SURGERY     left hip surgery 05/2018   JOINT REPLACEMENT Bilateral 12/2017 & 05/2018   hip   L5-S1 OLIF Dr. Rennis Harding back surgery 09/2019  09/28/2019   left ganglion cyst removal     12/23/19 Dr. Elvina Mattes   MASS EXCISION Left 12/17/2019   Procedure: EXCISION TUMOR FOOT DEEP LEFT;  Surgeon: Albertine Patricia, DPM;  Location: [place];  Service: Podiatry;  Laterality: Left;  LMA W/LOCAL   NASAL TURBINATE REDUCTION Bilateral 06/12/2019   Procedure: TURBINATE REDUCTION/SUBMUCOSAL RESECTION;  Surgeon: [name], MD;  Location: [place];  Service: ENT;  Laterality: Bilateral;   REPLACEMENT TOTAL HIP W/  RESURFACING IMPLANTS Right 12/2017  [facility]   right shoulder surgery     11/28/20 for bone spur, torn labrum, re attached biceps tendon, tear Dr. [name]   ROTATOR CUFF REPAIR     Dr. [name] in 04/2019    SCLEROTHERAPY     veins in lower legs   SEPTOPLASTY Bilateral 06/12/2019   Procedure: SEPTOPLASTY;  Surgeon: [name], MD;  Location: [place];  Service: ENT;  Laterality: Bilateral;   SHOULDER SURGERY Right 12/2015   [facility] torn rotator cuff   SPINAL FUSION  2016   [facility] lumbar    Social History:  reports that she has never smoked. She has  never used smokeless tobacco. She reports current alcohol use of about 1.0 standard drink per week. She reports that she does not use drugs. BMI: Estimated body mass index is 32.8 kg/m as calculated from the following:   Height as of 10/13/21: '5\' 7"'$  (1.702 m).   Weight as of 10/13/21: 95 kg.  Lab Results  Component Value Date   ALBUMIN 4.0 03/29/2021   Diabetes: Patient does not have a diagnosis of diabetes.     Smoking Status:   reports that she has never smoked. She has never used smokeless tobacco.    Allergies:  Allergies  Allergen Reactions   Amoxicillin Hives   Septra [Sulfamethoxazole-Trimethoprim] Hives        Sulfamethoxazole    Tizanidine Other (See Comments)    Weak muscles  Weak muscles   Trimethoprim    Cephalexin Rash         Medications: No current facility-administered medications for this encounter.   Current Outpatient Medications  Medication Sig Dispense Refill   acetaminophen (TYLENOL) 650 MG CR tablet Take 1,300 mg by mouth every 8 (eight) hours as needed for pain.     baclofen (LIORESAL) 10 MG tablet Take 10 mg by mouth 3 (three) times daily as needed for muscle spasms.     BELBUCA 450 MCG FILM Take 450 mcg by mouth every 12 (twelve) hours as needed (pain).     Buprenorphine HCl (BELBUCA) 450 MCG FILM Place inside cheek 2 (two) times daily as needed.     cetirizine (ZYRTEC) 10 MG tablet Take 10 mg by mouth daily as needed for allergies.     dexlansoprazole (DEXILANT) 60 MG capsule TAKE 1 CAPSULE BY MOUTH EVERY DAY 90 capsule 1   diclofenac Sodium (VOLTAREN) 1 % GEL APPLY 2 GRAMS TO UPPER BODY FOUR TIMES DAILY AS NEEDED AND 4 GRAMS TO LOWER BODY FOUR TIMES DAILY AS NEEDED 100 g 11   DULoxetine (CYMBALTA) 60 MG capsule TAKE 1 CAPSULE BY MOUTH EVERY DAY 90 capsule 1   Elastic Bandages & Supports (RELIEF KNEE) MISC Apply in the morning and remove at night.     estradiol (ESTRACE) 0.1 MG/GM vaginal cream Place 1 Applicatorful vaginally every 14  (fourteen) days.     ezetimibe (ZETIA) 10 MG tablet Take 1 tablet (10 mg total) by mouth daily. 30 tablet 3   famotidine (PEPCID) 20 MG tablet TAKE 1 TABLET BY MOUTH EVERYDAY AT BEDTIME 90 tablet 3   fluticasone (FLONASE) 50 MCG/ACT nasal spray SMARTSIG:1-2 Spray(s) Both Nares Daily PRN     hydrOXYzine (ATARAX) 10 MG tablet TAKE 1 TABLET BY MOUTH DAILY AS NEEDED FOR ANXIETY. 90 tablet 1   linaclotide (LINZESS) 145 MCG CAPS capsule Take 1 capsule (145 mcg total) by mouth daily before breakfast. 30 capsule 0   ondansetron (ZOFRAN-ODT) 8 MG disintegrating tablet Take 1  tablet (8 mg total) by mouth every 8 (eight) hours as needed for nausea or vomiting. 30 tablet 2   oxyCODONE-acetaminophen (PERCOCET) 10-325 MG tablet Take 1 tablet by mouth every 6 (six) hours as needed for pain.     phentermine (ADIPEX-P) 37.5 MG tablet TAKE 1 TABLET(37.5 MG) BY MOUTH DAILY BEFORE BREAKFAST 60 tablet 0   pregabalin (LYRICA) 50 MG capsule Take 100 mg by mouth 2 (two) times daily.     rivaroxaban (XARELTO) 10 MG TABS tablet Xarelto 10 mg tablet  TAKE 1 TABLET BY MOUTH EVERY DAY     rosuvastatin (CRESTOR) 5 MG tablet Take 1 tablet (5 mg total) by mouth daily. 90 tablet 3   traZODone (DESYREL) 100 MG tablet TAKE 2 TABLETS (200 MG TOTAL) BY MOUTH AT BEDTIME AS NEEDED FOR SLEEP. 180 tablet 1   triamcinolone ointment (KENALOG) 0.1 % Apply topically.     valACYclovir (VALTREX) 500 MG tablet Take 500 mg by mouth daily as needed (outbreaks).      No results found for this or any previous visit (from the past 48 hour(s)). No results found.  ROS: Pain with rom of the right upper  extremity  Physical Exam: Alert and oriented 56 y.o. female in no acute distress Cranial nerves 2-12 intact Cervical spine: full rom with no tenderness, nv intact distally Chest: active breath sounds bilaterally, no wheeze rhonchi or rales Heart: regular rate and rhythm, no murmur Abd: non tender non distended with active bowel sounds Hip is  stable with rom  Right shoulder painful rom with crepitus Nv intact distally Strength of ER and IR 5/5 bilaterally  Assessment/Plan Assessment: right shoulder end stage osteoarthritis  Plan:  Patient will undergo a right total shoulder arthroplasty by Dr. [name] at [facility] Risks benefits and expectations were discussed with the patient. Patient understand risks, benefits and expectations and wishes to proceed. Preoperative templating of the joint replacement has been completed, documented, and submitted to the Operating Room personnel in order to optimize intra-operative equipment management.   Merla Riches PA-C, MPAS [facility] is now [employer] [address], [place], [place], [place] [address] Phone: [phone] [email or web address] Facebook  [employer]

## 2021-12-13 NOTE — Patient Instructions (Addendum)
DUE TO COVID-19 ONLY TWO VISITORS  (aged 56 and older)  ARE ALLOWED TO COME WITH YOU AND STAY IN THE WAITING ROOM ONLY DURING PRE OP AND PROCEDURE.   **NO VISITORS ARE ALLOWED IN THE SHORT STAY AREA OR RECOVERY ROOM!!**  IF YOU WILL BE ADMITTED INTO THE HOSPITAL YOU ARE ALLOWED ONLY FOUR SUPPORT PEOPLE DURING VISITATION HOURS ONLY (7 AM -8PM)   The support person(s) must pass our screening, gel in and out, and wear a mask at all times, including in the patient's room. Patients must also wear a mask when staff or their support person are in the room. Visitors GUEST BADGE MUST BE WORN VISIBLY  One adult visitor may remain with you overnight and MUST be in the room by 8 P.M.     Your procedure is scheduled on: 12/22/21   Report to [facility] Main Entrance    Report to admitting at 7:25 AM   Call this number if you have problems the morning of surgery [phone]   Do not eat food :After Midnight.   After Midnight you may have the following liquids until 7:10 AM DAY OF SURGERY  Water Black Coffee (sugar ok, NO MILK/CREAM OR CREAMERS)  Tea (sugar ok, NO MILK/CREAM OR CREAMERS) regular and decaf                             Plain Jell-O (NO RED)                                           Fruit ices (not with fruit pulp, NO RED)                                     Popsicles (NO RED)                                                                  Juice: apple, WHITE grape, WHITE cranberry Sports drinks like Gatorade (NO RED) Clear broth(vegetable,chicken,beef)     The day of surgery:  Drink ONE (1) Pre-Surgery Clear Ensure at 7:10 AM the morning of surgery. Drink in one sitting. Do not sip.  This drink was given to you during your hospital  pre-op appointment visit. Nothing else to drink after completing the  Pre-Surgery Clear Ensure.          If you have questions, please contact your surgeon's office.   FOLLOW BOWEL PREP AND ANY ADDITIONAL PRE OP INSTRUCTIONS YOU  RECEIVED FROM YOUR SURGEON'S OFFICE!!!     Oral Hygiene is also important to reduce your risk of infection.                                    Remember - BRUSH YOUR TEETH THE MORNING OF SURGERY WITH YOUR REGULAR TOOTHPASTE   Take these medicines the morning of surgery with A SIP OF WATER: Tylenol, Zyrtec, Duloxetine, Zofran, Oxycodone, Pregabalin  You may not have any metal on your body including hair pins, jewelry, and body piercing             Do not wear make-up, lotions, powders, perfumes, or deodorant  Do not wear nail polish including gel and S&S, artificial/acrylic nails, or any other type of covering on natural nails including finger and toenails. If you have artificial nails, gel coating, etc. that needs to be removed by a nail salon please have this removed prior to surgery or surgery may need to be canceled/ delayed if the surgeon/ anesthesia feels like they are unable to be safely monitored.   Do not shave  48 hours prior to surgery.    Do not bring valuables to the hospital. [place].   Bring small overnight bag day of surgery.   DO NOT [place]. PHARMACY WILL DISPENSE MEDICATIONS LISTED ON YOUR MEDICATION LIST TO YOU DURING YOUR ADMISSION [place]!              Please read over the following fact sheets you were given: IF YOU HAVE QUESTIONS ABOUT YOUR PRE-OP INSTRUCTIONS PLEASE CALL [facility] - Preparing for Surgery Before surgery, you can play an important role.  Because skin is not sterile, your skin needs to be as free of germs as possible.  You can reduce the number of germs on your skin by washing with CHG (chlorahexidine gluconate) soap before surgery.  CHG is an antiseptic cleaner which kills germs and bonds with the skin to continue killing germs even after washing. Please DO NOT use if you have an allergy to CHG or  antibacterial soaps.  If your skin becomes reddened/irritated stop using the CHG and inform your nurse when you arrive at Short Stay. Do not shave (including legs and underarms) for at least 48 hours prior to the first CHG shower.  You may shave your face/neck.  Please follow these instructions carefully:  1.  Shower with CHG Soap the night before surgery and the  morning of surgery.  2.  If you choose to wash your hair, wash your hair first as usual with your normal  shampoo.  3.  After you shampoo, rinse your hair and body thoroughly to remove the shampoo.                             4.  Use CHG as you would any other liquid soap.  You can apply chg directly to the skin and wash.  Gently with a scrungie or clean washcloth.  5.  Apply the CHG Soap to your body ONLY FROM THE NECK DOWN.   Do   not use on face/ open                           Wound or open sores. Avoid contact with eyes, ears mouth and   genitals (private parts).                       Wash face,  Genitals (private parts) with your normal soap.             6.  Wash thoroughly, paying special attention to the area where your  surgery  will be performed.  7.  Thoroughly rinse your body with warm water from the neck down.  8.  DO NOT shower/wash with your normal soap after using and rinsing off the CHG Soap.                9.  Pat yourself dry with a clean towel.            10.  Wear clean pajamas.            11.  Place clean sheets on your bed the night of your first shower and do not  sleep with pets. Day of Surgery : Do not apply any lotions/deodorants the morning of surgery.  Please wear clean clothes to the hospital/surgery center.  FAILURE TO FOLLOW THESE INSTRUCTIONS MAY RESULT IN THE CANCELLATION OF YOUR SURGERY  PATIENT SIGNATURE_________________________________  NURSE SIGNATURE__________________________________  ________________________________________________________________________   Maureen Randall  An  incentive spirometer is a tool that can help keep your lungs clear and active. This tool measures how well you are filling your lungs with each breath. Taking long deep breaths may help reverse or decrease the chance of developing breathing (pulmonary) problems (especially infection) following: A long period of time when you are unable to move or be active. BEFORE THE PROCEDURE  If the spirometer includes an indicator to show your best effort, your nurse or respiratory therapist will set it to a desired goal. If possible, sit up straight or lean slightly forward. Try not to slouch. Hold the incentive spirometer in an upright position. INSTRUCTIONS FOR USE  Sit on the edge of your bed if possible, or sit up as far as you can in bed or on a chair. Hold the incentive spirometer in an upright position. Breathe out normally. Place the mouthpiece in your mouth and seal your lips tightly around it. Breathe in slowly and as deeply as possible, raising the piston or the ball toward the top of the column. Hold your breath for 3-5 seconds or for as long as possible. Allow the piston or ball to fall to the bottom of the column. Remove the mouthpiece from your mouth and breathe out normally. Rest for a few seconds and repeat Steps 1 through 7 at least 10 times every 1-2 hours when you are awake. Take your time and take a few normal breaths between deep breaths. The spirometer may include an indicator to show your best effort. Use the indicator as a goal to work toward during each repetition. After each set of 10 deep breaths, practice coughing to be sure your lungs are clear. If you have an incision (the cut made at the time of surgery), support your incision when coughing by placing a pillow or rolled up towels firmly against it. Once you are able to get out of bed, walk around indoors and cough well. You may stop using the incentive spirometer when instructed by your caregiver.  RISKS AND COMPLICATIONS Take  your time so you do not get dizzy or light-headed. If you are in pain, you may need to take or ask for pain medication before doing incentive spirometry. It is harder to take a deep breath if you are having pain. AFTER USE Rest and breathe slowly and easily. It can be helpful to keep track of a log of your progress. Your caregiver can provide you with a simple table to help with this. If you are using the spirometer at home, follow these instructions: [place] IF:  You are having difficultly using the spirometer. You have trouble using the spirometer as often as instructed. Your pain medication is not giving enough relief while using the spirometer. You develop fever of 100.5 F (38.1 C) or higher. SEEK IMMEDIATE MEDICAL CARE IF:  You cough up bloody sputum that had not been present before. You develop fever of 102 F (38.9 C) or greater. You develop worsening pain at or near the incision site. MAKE SURE YOU:  Understand these instructions. Will watch your condition. Will get help right away if you are not doing well or get worse. Document Released: 11/05/2006 Document Revised: 09/17/2011 Document Reviewed: 01/06/2007 ExitCare Patient Information 2014 [employer].   ________________________________________________________________________  [facility]- Preparing for Total Shoulder Arthroplasty    Before surgery, you can play an important role. Because skin is not sterile, your skin needs to be as free of germs as possible. You can reduce the number of germs on your skin by using the following products. Benzoyl Peroxide Gel Reduces the number of germs present on the skin Applied twice a day to shoulder area starting two days before surgery    ==================================================================  Please follow these instructions carefully:  BENZOYL PEROXIDE 5% GEL  Please do not use if you have an allergy to benzoyl peroxide.   If your skin becomes  reddened/irritated stop using the benzoyl peroxide.  Starting two days before surgery, apply as follows: Apply benzoyl peroxide in the morning and at night. Apply after taking a shower. If you are not taking a shower clean entire shoulder front, back, and side along with the armpit with a clean wet washcloth.  Place a quarter-sized dollop on your shoulder and rub in thoroughly, making sure to cover the front, back, and side of your shoulder, along with the armpit.   2 days before ____ AM   ____ PM              1 day before ____ AM   ____ PM                         Do this twice a day for two days.  (Last application is the night before surgery, AFTER using the CHG soap as described below).  Do NOT apply benzoyl peroxide gel on the day of surgery.

## 2021-12-13 NOTE — Progress Notes (Addendum)
COVID Vaccine Completed: yes x4  Date of COVID positive in last 90 days: n/a  PCP - Orland Mustard, MD Cardiologist - Lyman Bishop, MD  Chest x-ray - 11/27/21 on chart EKG - n/a Stress Test - n/a ECHO - 01/20/19 Epic Cardiac Cath - n/a Pacemaker/ICD device last checked: n/a Spinal Cord Stimulator: n/a  Bowel Prep - no  Sleep Study - n/a CPAP -   Fasting Blood Sugar - no per pt, just checked Checks Blood Sugar _____ times a day  Blood Thinner Instructions: Xarelto, no instructions patient will call prescribing MD Aspirin Instructions: Last Dose:  Activity level: Can go up a flight of stairs and perform activities of daily living without stopping and without symptoms of chest pain or shortness of breath.   Anesthesia review:   Patient denies shortness of breath, fever, cough and chest pain at PAT appointment  Patient verbalized understanding of instructions that were given to them at the PAT appointment. Patient was also instructed that they will need to review over the PAT instructions again at home before surgery.

## 2021-12-14 ENCOUNTER — Encounter (HOSPITAL_COMMUNITY)
Admission: RE | Admit: 2021-12-14 | Discharge: 2021-12-14 | Disposition: A | Discharge status: Home / Self Care | Payer: 59 | Source: Ambulatory Visit | Attending: Orthopedic Surgery | Admitting: Orthopedic Surgery

## 2021-12-14 ENCOUNTER — Encounter (HOSPITAL_COMMUNITY): Payer: Self-pay

## 2021-12-14 VITALS — BP 120/90 | HR 73 | Temp 98.4°F | Resp 14 | Ht 66.0 in | Wt 203.8 lb

## 2021-12-14 DIAGNOSIS — Z01818 Encounter for other preprocedural examination: Secondary | ICD-10-CM

## 2021-12-14 DIAGNOSIS — R7303 Prediabetes: Secondary | ICD-10-CM

## 2021-12-14 DIAGNOSIS — Z01812 Encounter for preprocedural laboratory examination: Secondary | ICD-10-CM | POA: Diagnosis present

## 2021-12-14 HISTORY — DX: Other cervical disc degeneration, unspecified cervical region: M50.30

## 2021-12-14 HISTORY — DX: Nausea with vomiting, unspecified: R11.2

## 2021-12-14 HISTORY — DX: Other specified postprocedural states: Z98.890

## 2021-12-14 LAB — BASIC METABOLIC PANEL
Anion gap: 6 (ref 5–15)
BUN: 21 mg/dL — ABNORMAL HIGH (ref 6–20)
CO2: 26 mmol/L (ref 22–32)
Calcium: 8.9 mg/dL (ref 8.9–10.3)
Chloride: 109 mmol/L (ref 98–111)
Creatinine, Ser: 0.68 mg/dL (ref 0.44–1.00)
GFR, Estimated: >60 mL/min (ref >60)
Glucose, Bld: 102 mg/dL — ABNORMAL HIGH (ref 70–99)
Potassium: 4 mmol/L (ref 3.5–5.1)
Sodium: 141 mmol/L (ref 135–145)

## 2021-12-14 LAB — CBC
HCT: 40 % (ref 36.0–46.0)
Hemoglobin: 13.1 g/dL (ref 12.0–15.0)
MCH: 31.9 pg (ref 26.0–34.0)
MCHC: 32.8 g/dL (ref 30.0–36.0)
MCV: 97.3 fL (ref 80.0–100.0)
Platelets: 322 10*3/uL (ref 150–400)
RBC: 4.11 MIL/uL (ref 3.87–5.11)
RDW: 13.7 % (ref 11.5–15.5)
WBC: 5.4 10*3/uL (ref 4.0–10.5)
nRBC: 0 % (ref 0.0–0.2)

## 2021-12-14 LAB — SURGICAL PCR SCREEN
MRSA, PCR: NEGATIVE
Staphylococcus aureus: NEGATIVE

## 2021-12-14 LAB — HEMOGLOBIN A1C
Hgb A1c MFr Bld: 5.5 % (ref 4.8–5.6)
Mean Plasma Glucose: 111.15 mg/dL

## 2021-12-15 ENCOUNTER — Encounter: Payer: Self-pay | Admitting: Internal Medicine

## 2021-12-15 ENCOUNTER — Ambulatory Visit (INDEPENDENT_AMBULATORY_CARE_PROVIDER_SITE_OTHER): Payer: 59 | Admitting: Internal Medicine

## 2021-12-15 VITALS — BP 100/70 | HR 78 | Temp 98.2°F | Resp 14 | Ht 66.0 in | Wt 205.6 lb

## 2021-12-15 DIAGNOSIS — M5020 Other cervical disc displacement, unspecified cervical region: Secondary | ICD-10-CM | POA: Diagnosis not present

## 2021-12-15 DIAGNOSIS — M79672 Pain in left foot: Secondary | ICD-10-CM | POA: Diagnosis not present

## 2021-12-15 DIAGNOSIS — E785 Hyperlipidemia, unspecified: Secondary | ICD-10-CM

## 2021-12-15 DIAGNOSIS — Z1231 Encounter for screening mammogram for malignant neoplasm of breast: Secondary | ICD-10-CM

## 2021-12-15 DIAGNOSIS — S8412XA Injury of peroneal nerve at lower leg level, left leg, initial encounter: Secondary | ICD-10-CM | POA: Diagnosis not present

## 2021-12-15 DIAGNOSIS — J189 Pneumonia, unspecified organism: Secondary | ICD-10-CM

## 2021-12-15 DIAGNOSIS — K219 Gastro-esophageal reflux disease without esophagitis: Secondary | ICD-10-CM

## 2021-12-15 MED ORDER — FAMOTIDINE 20 MG PO TABS
20.0000 mg | ORAL_TABLET | Freq: Two times a day (BID) | ORAL | 3 refills | Status: DC
Start: 2021-12-15 — End: 2022-04-17

## 2021-12-15 MED ORDER — FAMOTIDINE 20 MG PO TABS: 20.0000 mg | ORAL_TABLET | Freq: Two times a day (BID) | ORAL | 3 refills | Status: DC

## 2021-12-15 NOTE — Patient Instructions (Addendum)
Cxr by 7/16 no later if they dont do one call back and schedule   Dr. Volanda Napoleon will be here in 01/2022   Dr. Radene Journey [facility] orth[facility]  Phone Fax E-mail Address  [phone] Not available [place] [phone]     Specialties     Orthopedic Surgery      Pneumococcal Conjugate Vaccine (Prevnar 20) Suspension for Injection-call back for this What is this medication? PNEUMOCOCCAL VACCINE (NEU mo KOK al vak SEEN) is a vaccine. It prevents pneumococcus bacterial infections. These bacteria can cause serious infections like pneumonia, meningitis, and blood infections. This vaccine will not treat an infection and will not cause infection. This vaccine is recommended for adults 18 years and older. This medicine may be used for other purposes; ask your health care provider or pharmacist if you have questions. COMMON BRAND NAME(S): Prevnar 20 What should I tell my care team before I take this medication? They need to know if you have any of these conditions: bleeding disorder fever immune system problems an unusual or allergic reaction to pneumococcal vaccine, diphtheria toxoid, other vaccines, other medicines, foods, dyes, or preservatives pregnant or trying to get pregnant breast-feeding How should I use this medication? This vaccine is injected into a muscle. It is given by a health care provider. A copy of Vaccine Information Statements will be given before each vaccination. Be sure to read this information carefully each time. This sheet may change often. Talk to your health care provider about the use of this medicine in children. Special care may be needed. Overdosage: If you think you have taken too much of this medicine contact a poison control center or emergency room at once. NOTE: This medicine is only for you. Do not share this medicine with others. What if I miss a dose? This does not apply. This medicine is not for regular use. What may interact with this  medication? medicines for cancer chemotherapy medicines that suppress your immune function steroid medicines like prednisone or cortisone This list may not describe all possible interactions. Give your health care provider a list of all the medicines, herbs, non-prescription drugs, or dietary supplements you use. Also tell them if you smoke, drink alcohol, or use illegal drugs. Some items may interact with your medicine. What should I watch for while using this medication? Mild fever and pain should go away in 3 days or less. Report any unusual symptoms to your health care provider. What side effects may I notice from receiving this medication? Side effects that you should report to your doctor or health care professional as soon as possible: allergic reactions (skin rash, itching or hives; swelling of the face, lips, or tongue) confusion fast, irregular heartbeat fever over 102 degrees F muscle weakness seizures trouble breathing unusual bruising or bleeding Side effects that usually do not require medical attention (report to your doctor or health care professional if they continue or are bothersome): fever of 102 degrees F or less headache joint pain muscle cramps, pain pain, tender at site where injected This list may not describe all possible side effects. Call your doctor for medical advice about side effects. You may report side effects to FDA at 1-800-FDA-1088. Where should I keep my medication? This vaccine is only given by a health care provider. It will not be stored at home. NOTE: This sheet is a summary. It may not cover all possible information. If you have questions about this medicine, talk to your doctor,  pharmacist, or health care provider.  2023 Elsevier/Gold Standard (2020-03-10 00:00:00)

## 2021-12-15 NOTE — Progress Notes (Signed)
Chief Complaint  Patient presents with   Follow-up    6 mon, disc about referral to foot doc Dr.Kyle Cobell at [facility], has upcoming surgery next Friday for R shoulder. Disc about getting pneumo shot since she had pneumonia last mon   F/u  1. Gerd on dexilant 60 mg and pepcid 20 mg qd and still getting GERD with pizza will increase to bid  2. Left foot pain and peroneal nerve issues will refer Christ [facility]  3. Herniated disc s/p injection Dr. Alvira Monday and helping and right shoulder pain pending total shoulder 12/22/21 Dr. [name]     Review of Systems  Constitutional:  Negative for weight loss.  HENT:  Negative for hearing loss.   Eyes:  Negative for blurred vision.  Respiratory:  Negative for shortness of breath.   Cardiovascular:  Negative for chest pain.  Gastrointestinal:  Negative for abdominal pain and blood in stool.  Genitourinary:  Negative for dysuria.  Musculoskeletal:  Positive for back pain, joint pain and neck pain. Negative for falls.  Skin:  Negative for rash.  Neurological:  Negative for headaches.  Psychiatric/Behavioral:  Negative for depression.    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 after surgery, while on BCP   GERD (gastroesophageal reflux disease)    Headache, post-myelogram 09/11/2021   Hyperlipidemia    Left leg DVT ([place])    fem/pop 12/07/20 after right shoulder surgery 11/30/20   Neuropathy    post surgery   Pneumonia    11/2021 seen at [facility]   PONV (postoperative nausea and vomiting)    PVD (peripheral vascular disease)  ([place])    Spondylolisthesis    Past Surgical History:  Procedure Laterality Date   ABLATION     veins in lower legs   BACK SURGERY  07/2014   lumbar spine/ fusion 1 level   BREAST BIOPSY Left 2011   neg   CARPAL TUNNEL RELEASE Right 12/2015   [facility]   CARPAL TUNNEL RELEASE Left 12/2016   COLONOSCOPY WITH PROPOFOL N/A 02/29/2020   Procedure: COLONOSCOPY WITH PROPOFOL;  Surgeon: [name], MD;  Location: [facility];  Service: Endoscopy;  Laterality: N/A;   ESOPHAGOGASTRODUODENOSCOPY (EGD) WITH PROPOFOL N/A 02/29/2020   Procedure: ESOPHAGOGASTRODUODENOSCOPY (EGD) WITH PROPOFOL;  Surgeon: [name], MD;  Location: [facility];  Service: Endoscopy;  Laterality: N/A;   FOOT SURGERY Right 2015   broken navicular bone   HIP ARTHROSCOPY Right 05/2011   HIP SURGERY     left hip surgery 05/2018   JOINT REPLACEMENT Bilateral 12/2017 & 05/2018   hip   L5-S1 OLIF Dr. Rennis Harding back surgery 09/2019  09/28/2019   left ganglion cyst removal     12/23/19 Dr. Elvina Mattes   MASS EXCISION Left 12/17/2019   Procedure: EXCISION TUMOR FOOT DEEP LEFT;  Surgeon: Albertine Patricia, DPM;  Location: [facility];  Service: Podiatry;  Laterality: Left;  LMA W/LOCAL   NASAL TURBINATE REDUCTION Bilateral 06/12/2019   Procedure: TURBINATE REDUCTION/SUBMUCOSAL RESECTION;  Surgeon: [name], MD;  Location: [facility];  Service: ENT;  Laterality: Bilateral;   REPLACEMENT TOTAL HIP W/  RESURFACING IMPLANTS Right 12/2017   [facility]   right shoulder surgery     11/28/20 for bone spur, torn labrum, re attached biceps tendon, tear Dr. [name]   right shoulder surgery     12/22/21 total shoulder replacement [facility] Dr. Malva Cogan CUFF REPAIR     Dr. [name] in 04/2019    SCLEROTHERAPY     veins in lower legs   SEPTOPLASTY Bilateral 06/12/2019   Procedure: SEPTOPLASTY;  Surgeon: [name], MD;  Location: [facility];  Service: ENT;   Laterality: Bilateral;   SHOULDER SURGERY Right 12/2015   [facility] torn rotator cuff   SPINAL FUSION  2016   [facility] lumbar   Family History  Problem Relation Age of Onset   Breast cancer Maternal Aunt    Breast cancer Paternal Aunt    Dementia Paternal Aunt    COPD Mother        quit smoking after 71 years   Arthritis Mother    Osteoarthritis Mother    Dementia Father        symptoms started in his 58s   Alzheimer's disease Father    Rheum arthritis Father    Rheum arthritis Other        FH   Dementia Other        "all my dad's side of the family"   Other Maternal Grandmother        got dementia as she got older    Dementia Paternal Grandmother    Social History   Socioeconomic History   Marital status: Married    Spouse name: fredrick   Number of children: 1   Years of education: Not on file   Highest education level: High school graduate  Occupational History    Comment: full time  Tobacco Use   Smoking status: Never   Smokeless tobacco: Never  Vaping Use   Vaping Use: Never used  Substance and Sexual Activity   Alcohol use: Not Currently    Alcohol/week: 1.0 standard drink of alcohol    Types: 1 Cans of beer per week    Comment: may have drink 1x/month   Drug use: Never   Sexual activity: Yes  Other Topics Concern   Not on file  Social History Narrative   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    Married 1 child    Lives at home with her husband   Right handed   Caffeine: maybe 1 cup/day (pepsi)   Social Determinants of Health   Financial Resource Strain: Medium Risk (04/30/2018)   Overall Financial Resource Strain (CARDIA)    Difficulty of Paying Living Expenses: Somewhat hard  Food Insecurity: Food Insecurity Present (04/30/2018)   Hunger Vital Sign    Worried About Running Out of Food in the Last Year: Sometimes true    Ran Out of Food in the Last Year: Sometimes true  Transportation Needs: No Transportation Needs  (04/30/2018)   PRAPARE - Hydrologist (Medical): No    Lack of Transportation (Non-Medical): No  Physical Activity: Inactive (04/30/2018)   Exercise Vital Sign    Days of Exercise per Week: 0 days    Minutes of Exercise per Session: 0 min  Stress: Stress Concern Present (04/30/2018)   [place]  Feeling of Stress : Very much  Social Connections: Unknown (04/30/2018)   Social Connection and Isolation Panel [NHANES]    Frequency of Communication with Friends and Family: Not on file    Frequency of Social Gatherings with Friends and Family: Not on file    Attends Religious Services: Never    Active Member of Clubs or Organizations: No    Attends Archivist Meetings: Never    Marital Status: Married  Human resources officer Violence: Not At Risk (04/30/2018)   Humiliation, Afraid, Rape, and Kick questionnaire    Fear of Current or Ex-Partner: No    Emotionally Abused: No    Physically Abused: No    Sexually Abused: No   Current Meds  Medication Sig   acetaminophen (TYLENOL) 650 MG CR tablet Take 1,300 mg by mouth every 8 (eight) hours as needed (pain/headaches.).   baclofen (LIORESAL) 10 MG tablet Take 10 mg by mouth 2 (two) times daily as needed for muscle spasms.   Buprenorphine HCl (BELBUCA) 450 MCG FILM Place 450 mcg inside cheek in the morning and at bedtime.   cetirizine (ZYRTEC) 10 MG tablet Take 10 mg by mouth daily as needed for allergies.   dexlansoprazole (DEXILANT) 60 MG capsule TAKE 1 CAPSULE BY MOUTH EVERY DAY (Patient taking differently: Take 60 mg by mouth at bedtime.)   diclofenac Sodium (VOLTAREN) 1 % GEL APPLY 2 GRAMS TO UPPER BODY FOUR TIMES DAILY AS NEEDED AND 4 GRAMS TO LOWER BODY FOUR TIMES DAILY AS NEEDED   DULoxetine (CYMBALTA) 60 MG capsule TAKE 1 CAPSULE BY MOUTH EVERY DAY   estradiol (ESTRACE) 0.1 MG/GM vaginal cream Place 1 Applicatorful vaginally every 14  (fourteen) days.   ezetimibe (ZETIA) 10 MG tablet Take 1 tablet (10 mg total) by mouth daily. (Patient taking differently: Take 10 mg by mouth at bedtime.)   fluticasone (FLONASE) 50 MCG/ACT nasal spray Place 1-2 sprays into both nostrils daily as needed for allergies.   hydrOXYzine (ATARAX) 10 MG tablet TAKE 1 TABLET BY MOUTH DAILY AS NEEDED FOR ANXIETY.   ondansetron (ZOFRAN-ODT) 8 MG disintegrating tablet Take 1 tablet (8 mg total) by mouth every 8 (eight) hours as needed for nausea or vomiting.   oxyCODONE-acetaminophen (PERCOCET) 10-325 MG tablet Take 1 tablet by mouth in the morning, at noon, in the evening, and at bedtime.   pregabalin (LYRICA) 50 MG capsule Take 100 mg by mouth 2 (two) times daily.   psyllium (METAMUCIL SMOOTH TEXTURE) 28 % packet Take 1 packet by mouth every other day.   rivaroxaban (XARELTO) 10 MG TABS tablet Take 10 mg by mouth in the morning. (1000)   traZODone (DESYREL) 100 MG tablet TAKE 2 TABLETS (200 MG TOTAL) BY MOUTH AT BEDTIME AS NEEDED FOR SLEEP. (Patient taking differently: Take 150 mg by mouth at bedtime.)   TURMERIC PO Take 2,000 mg by mouth in the morning. 1000 mg/capsule   valACYclovir (VALTREX) 500 MG tablet Take 500 mg by mouth 2 (two) times daily as needed (outbreaks).   [DISCONTINUED] famotidine (PEPCID) 20 MG tablet TAKE 1 TABLET BY MOUTH EVERYDAY AT BEDTIME   Allergies  Allergen Reactions   Amoxicillin Hives   Crestor [Rosuvastatin]     Myalgia    Septra [Sulfamethoxazole-Trimethoprim] Hives        Sulfamethoxazole Hives   Trimethoprim Hives   Zanaflex [Tizanidine] Other (See Comments)    Weak muscles     Keflex [Cephalexin] Rash        Recent Results (from the past  2160 hour(s))  Lipid panel     Status: Abnormal   Collection Time: 10/03/21  8:39 AM  Result Value Ref Range   Cholesterol, Total 235 (H) 100 - 199 mg/dL   Triglycerides 116 0 - 149 mg/dL   HDL 65 >39 mg/dL   VLDL Cholesterol Cal 20 5 - 40 mg/dL   LDL Chol Calc (NIH)  150 (H) 0 - 99 mg/dL   Chol/HDL Ratio 3.6 0.0 - 4.4 ratio    Comment:                                   T. Chol/HDL Ratio                                             Men  Women                               1/2 Avg.Risk  3.4    3.3                                   Avg.Risk  5.0    4.4                                2X Avg.Risk  9.6    7.1                                3X Avg.Risk 23.4   11.0   Hemoglobin A1c per protocol     Status: None   Collection Time: 12/14/21  9:47 AM  Result Value Ref Range   Hgb A1c MFr Bld 5.5 4.8 - 5.6 %    Comment: (NOTE) Pre diabetes:          5.7%-6.4%  Diabetes:              >6.4%  Glycemic control for   <7.0% adults with diabetes    Mean Plasma Glucose 111.15 mg/dL    Comment: Performed at [facility] [address] [place], [place] [address]  Surgical pcr screen     Status: None   Collection Time: 12/14/21 10:24 AM   Specimen: Nasal Mucosa; Nasal Swab  Result Value Ref Range   MRSA, PCR NEGATIVE NEGATIVE   Staphylococcus aureus NEGATIVE NEGATIVE    Comment: (NOTE) The Xpert SA Assay (FDA approved for NASAL specimens in patients 52 years of age and older), is one component of a comprehensive surveillance program. It is not intended to diagnose infection nor to guide or monitor treatment. Performed at [facility], [place] [address], [place], [place] [address]   CBC per protocol     Status: None   Collection Time: 12/14/21 10:24 AM  Result Value Ref Range   WBC 5.4 4.0 - 10.5 K/uL   RBC 4.11 3.87 - 5.11 MIL/uL   Hemoglobin 13.1 12.0 - 15.0 g/dL   HCT 40.0 36.0 - 46.0 %   MCV 97.3 80.0 - 100.0 fL   MCH 31.9 26.0 - 34.0 pg   MCHC 32.8 30.0 -  36.0 g/dL   RDW 13.7 11.5 - 15.5 %   Platelets 322 150 - 400 K/uL   nRBC 0.0 0.0 - 0.2 %    Comment: Performed at [facility], [place] [address], [place], [place] [address]  Basic metabolic panel     Status: Abnormal   Collection Time: 12/14/21 10:24 AM   Result Value Ref Range   Sodium 141 135 - 145 mmol/L   Potassium 4.0 3.5 - 5.1 mmol/L   Chloride 109 98 - 111 mmol/L   CO2 26 22 - 32 mmol/L   Glucose, Bld 102 (H) 70 - 99 mg/dL    Comment: Glucose reference range applies only to samples taken after fasting for at least 8 hours.   BUN 21 (H) 6 - 20 mg/dL   Creatinine, Ser 0.68 0.44 - 1.00 mg/dL   Calcium 8.9 8.9 - 10.3 mg/dL   GFR, Estimated >60 >60 mL/min    Comment: (NOTE) Calculated using the CKD-EPI Creatinine Equation (2021)    Anion gap 6 5 - 15    Comment: Performed at [facility], [place] [address], [place], [place] [address]   Objective  Body mass index is 33.18 kg/m. Wt Readings from Last 3 Encounters:  12/15/21 205 lb 9.6 oz (93.3 kg)  12/14/21 203 lb 12.8 oz (92.4 kg)  10/13/21 209 lb 6.4 oz (95 kg)   Temp Readings from Last 3 Encounters:  12/15/21 98.2 F (36.8 C) (Oral)  12/14/21 98.4 F (36.9 C) (Oral)  08/01/21 98.6 F (37 C) (Oral)   BP Readings from Last 3 Encounters:  12/15/21 100/70  12/14/21 120/90  10/13/21 112/62   Pulse Readings from Last 3 Encounters:  12/15/21 78  12/14/21 73  10/13/21 71    Physical Exam Vitals and nursing note reviewed.  Constitutional:      Appearance: Normal appearance. She is well-developed and well-groomed.  HENT:     Head: Normocephalic and atraumatic.  Eyes:     Conjunctiva/sclera: Conjunctivae normal.     Pupils: Pupils are equal, round, and reactive to light.  Cardiovascular:     Rate and Rhythm: Normal rate and regular rhythm.     Heart sounds: Normal heart sounds. No murmur heard. Pulmonary:     Effort: Pulmonary effort is normal.     Breath sounds: Normal breath sounds.  Abdominal:     General: Abdomen is flat. Bowel sounds are normal.     Tenderness: There is no abdominal tenderness.  Musculoskeletal:        General: No tenderness.  Skin:    General: Skin is warm and dry.  Neurological:     General: No focal deficit present.      Mental Status: She is alert and oriented to person, place, and time. Mental status is at baseline.     Cranial Nerves: Cranial nerves 2-12 are intact.     Motor: Motor function is intact.     Coordination: Coordination is intact.     Gait: Gait is intact.  Psychiatric:        Attention and Perception: Attention and perception normal.        Mood and Affect: Mood and affect normal.        Speech: Speech normal.        Behavior: Behavior normal. Behavior is cooperative.        Thought Content: Thought content normal.        Cognition and Memory: Cognition and memory normal.  Judgment: Judgment normal.     Assessment  Plan  Cervical herniated disc F/u NS Dr. Harriett Sine NS and  Dr. Alvira Monday pain clinic s/p injection   Left foot pain - Plan: Ambulatory referral to Orthopedic Surgery  Injury of left peroneal nerve, initial encounter - Plan: Ambulatory referral to Orthopedic Surgery  Pneumonia of left lower lobe due to infectious organism - Plan: DG Chest 2 View by 01/21/22  Hyperlipidemia, unspecified hyperlipidemia type  Gastroesophageal reflux disease without esophagitis - Plan: famotidine (PEPCID) 20 MG tablet bid   HM Declines flu shot  Tdap utd  Pfizer 4/4 shingrix 2/2 Pt will call back for prevnar 20 vaccine     Consider hep B vaccine in future NR 08/31/15  -disc new Hep B vaccine in the future x 2 doses    HCV neg 08/31/15     Pap neg 09/11/16 neg neg HPV  Will f/u Dr. Leafy Ro has IUD checked 06/28/20 and pap neg neg HPV   Mammogram 08/12/19 ob/gyn ordered Dr. Leafy Ro negative sch 08/16/20 and negative  09/12/21 negative   dexa 01/04/20 normal    Colonoscopy utd had 02/2020 f/u in 10 years with EGD Dr. Allen Norris    Dermatology. Yearly [facility] Barnetta Chapel due to see 09/28/21   Never smoker    Rec healthy diet and exercise   Other providers Former PCP [facility] Dr. Garen Grams then [facility] requested all imaging  Xrays, MRIS, CT Pain Dr. Alvira Monday in [place]  -had epidural neck injection   Spine specialist Dr. Rennis Harding ortho spine in [place] [facility]-Dr. Tami Ribas stated pt did not have allergies of note had testing Psychiatry and therapy-following  PT [facility] Dr. Tyler Pita, Dr. Lonia Blood [facility]  Ortho spine Dr. Rennis Harding  Ortho Dr .[name] [facility] in [place]  surgery 12/22/21 right shouldercarolina NS Dr. Christella Noa as of 12/2021  [facility]  HLD-Dr. Debara Pickett stopped crestor due pain   Of note meds pt gets from PCP  Valtrex  adipex  Macrobid  Robaxin  voltaren gel    04/14/19 ENT Dr. Tami Ribas sinusitis deviated nasal septum right nasal endoscopy and left nasacort 55 nasal hoarse voice small hemorrhage right cord 2/2 coughing and inhalers f/u in 6 weeks after shoulder surgery      Provider: Dr. Olivia Mackie McLean-Scocuz[facility]-Internal Medicine

## 2021-12-20 DIAGNOSIS — Z96611 Presence of right artificial shoulder joint: Secondary | ICD-10-CM | POA: Insufficient documentation

## 2021-12-21 NOTE — Anesthesia Preprocedure Evaluation (Addendum)
Anesthesia Evaluation  Patient identified by MRN, date of birth, ID band Patient awake    Reviewed: Allergy & Precautions, NPO status , Patient's Chart, lab work & pertinent test results  Airway Mallampati: II  TM Distance: >3 FB Neck ROM: Full    Dental no notable dental hx. (+) Teeth Intact, Dental Advisory Given   Pulmonary    Pulmonary exam normal breath sounds clear to auscultation       Cardiovascular + DVT  Normal cardiovascular exam Rhythm:Regular Rate:Normal  01/2019 Echo 1. The left ventricle has normal systolic function, with an ejection  fraction of 55-60%. The cavity size was normal. Left ventricular diastolic  parameters were normal. No evidence of left ventricular regional wall  motion abnormalities.  2. The right ventricle has normal systolic function. The cavity was  normal. There is no increase in right ventricular wall thickness. Right  ventricular systolic pressure could not be assessed.  3. Trivial pericardial effusion is present.  4. The mitral valve is grossly normal.  5. The aortic valve was not well visualized.  6. The aortic root is normal in size and structure.  7. The interatrial septum was not well visualized.    Neuro/Psych  Headaches, PSYCHIATRIC DISORDERS Anxiety    GI/Hepatic GERD  ,  Endo/Other    Renal/GU      Musculoskeletal  (+) Arthritis ,   Abdominal (+) + obese (BMI:33.2),   Peds  Hematology Lab Results      Component                Value               Date                        HGB                      13.1                12/14/2021                HCT                      40.0                12/14/2021               PLT                      322                 12/14/2021              Anesthesia Other Findings All: sulfa, amoxicillin, keflex, crestor  Reproductive/Obstetrics                            Anesthesia Physical Anesthesia  Plan  ASA: 2  Anesthesia Plan: General   Post-op Pain Management: Precedex   Induction: Intravenous  PONV Risk Score and Plan: 3 and TIVA, Midazolam, Ondansetron and Treatment may vary due to age or medical condition  Airway Management Planned: Oral ETT  Additional Equipment: None  Intra-op Plan:   Post-operative Plan: Extubation in OR  Informed Consent: I have reviewed the patients History and Physical, chart, labs and discussed the procedure including the risks, benefits and alternatives for the proposed anesthesia with the patient or  authorized representative who has indicated his/her understanding and acceptance.     Dental advisory given  Plan Discussed with:   Anesthesia Plan Comments: (GA w R ISB)       Anesthesia Quick Evaluation

## 2021-12-22 ENCOUNTER — Ambulatory Visit (HOSPITAL_BASED_OUTPATIENT_CLINIC_OR_DEPARTMENT_OTHER): Payer: 59 | Admitting: Anesthesiology

## 2021-12-22 ENCOUNTER — Ambulatory Visit (HOSPITAL_COMMUNITY): Payer: 59 | Admitting: Anesthesiology

## 2021-12-22 ENCOUNTER — Observation Stay (HOSPITAL_COMMUNITY)
Admission: RE | Admit: 2021-12-22 | Discharge: 2021-12-23 | Disposition: A | Discharge status: Home / Self Care | Payer: 59 | Attending: Orthopedic Surgery | Admitting: Orthopedic Surgery

## 2021-12-22 ENCOUNTER — Encounter (HOSPITAL_COMMUNITY)
Admission: RE | Disposition: A | Discharge status: Home / Self Care | Payer: Self-pay | Source: Home / Self Care | Attending: Orthopedic Surgery

## 2021-12-22 ENCOUNTER — Other Ambulatory Visit: Payer: Self-pay

## 2021-12-22 ENCOUNTER — Observation Stay (HOSPITAL_COMMUNITY): Payer: 59

## 2021-12-22 ENCOUNTER — Encounter (HOSPITAL_COMMUNITY): Payer: Self-pay | Admitting: Orthopedic Surgery

## 2021-12-22 DIAGNOSIS — J45909 Unspecified asthma, uncomplicated: Secondary | ICD-10-CM | POA: Insufficient documentation

## 2021-12-22 DIAGNOSIS — M19011 Primary osteoarthritis, right shoulder: Principal | ICD-10-CM | POA: Insufficient documentation

## 2021-12-22 DIAGNOSIS — Z01818 Encounter for other preprocedural examination: Secondary | ICD-10-CM

## 2021-12-22 DIAGNOSIS — J449 Chronic obstructive pulmonary disease, unspecified: Secondary | ICD-10-CM | POA: Diagnosis not present

## 2021-12-22 DIAGNOSIS — R7303 Prediabetes: Secondary | ICD-10-CM

## 2021-12-22 DIAGNOSIS — Z96643 Presence of artificial hip joint, bilateral: Secondary | ICD-10-CM | POA: Diagnosis not present

## 2021-12-22 DIAGNOSIS — Z79899 Other long term (current) drug therapy: Secondary | ICD-10-CM | POA: Diagnosis not present

## 2021-12-22 DIAGNOSIS — Z96611 Presence of right artificial shoulder joint: Secondary | ICD-10-CM

## 2021-12-22 DIAGNOSIS — Z86718 Personal history of other venous thrombosis and embolism: Secondary | ICD-10-CM | POA: Diagnosis not present

## 2021-12-22 HISTORY — PX: TOTAL SHOULDER ARTHROPLASTY: SHX126

## 2021-12-22 HISTORY — DX: Presence of right artificial shoulder joint: Z96.611

## 2021-12-22 IMAGING — DX DG SHOULDER 1V*R*
1 series · 1 of 1 positions shown · non-contrast
Comparison: None Available.

CLINICAL DATA: Right shoulder replacement

EXAM:
RIGHT SHOULDER - 1 VIEW

[shoulder ap]
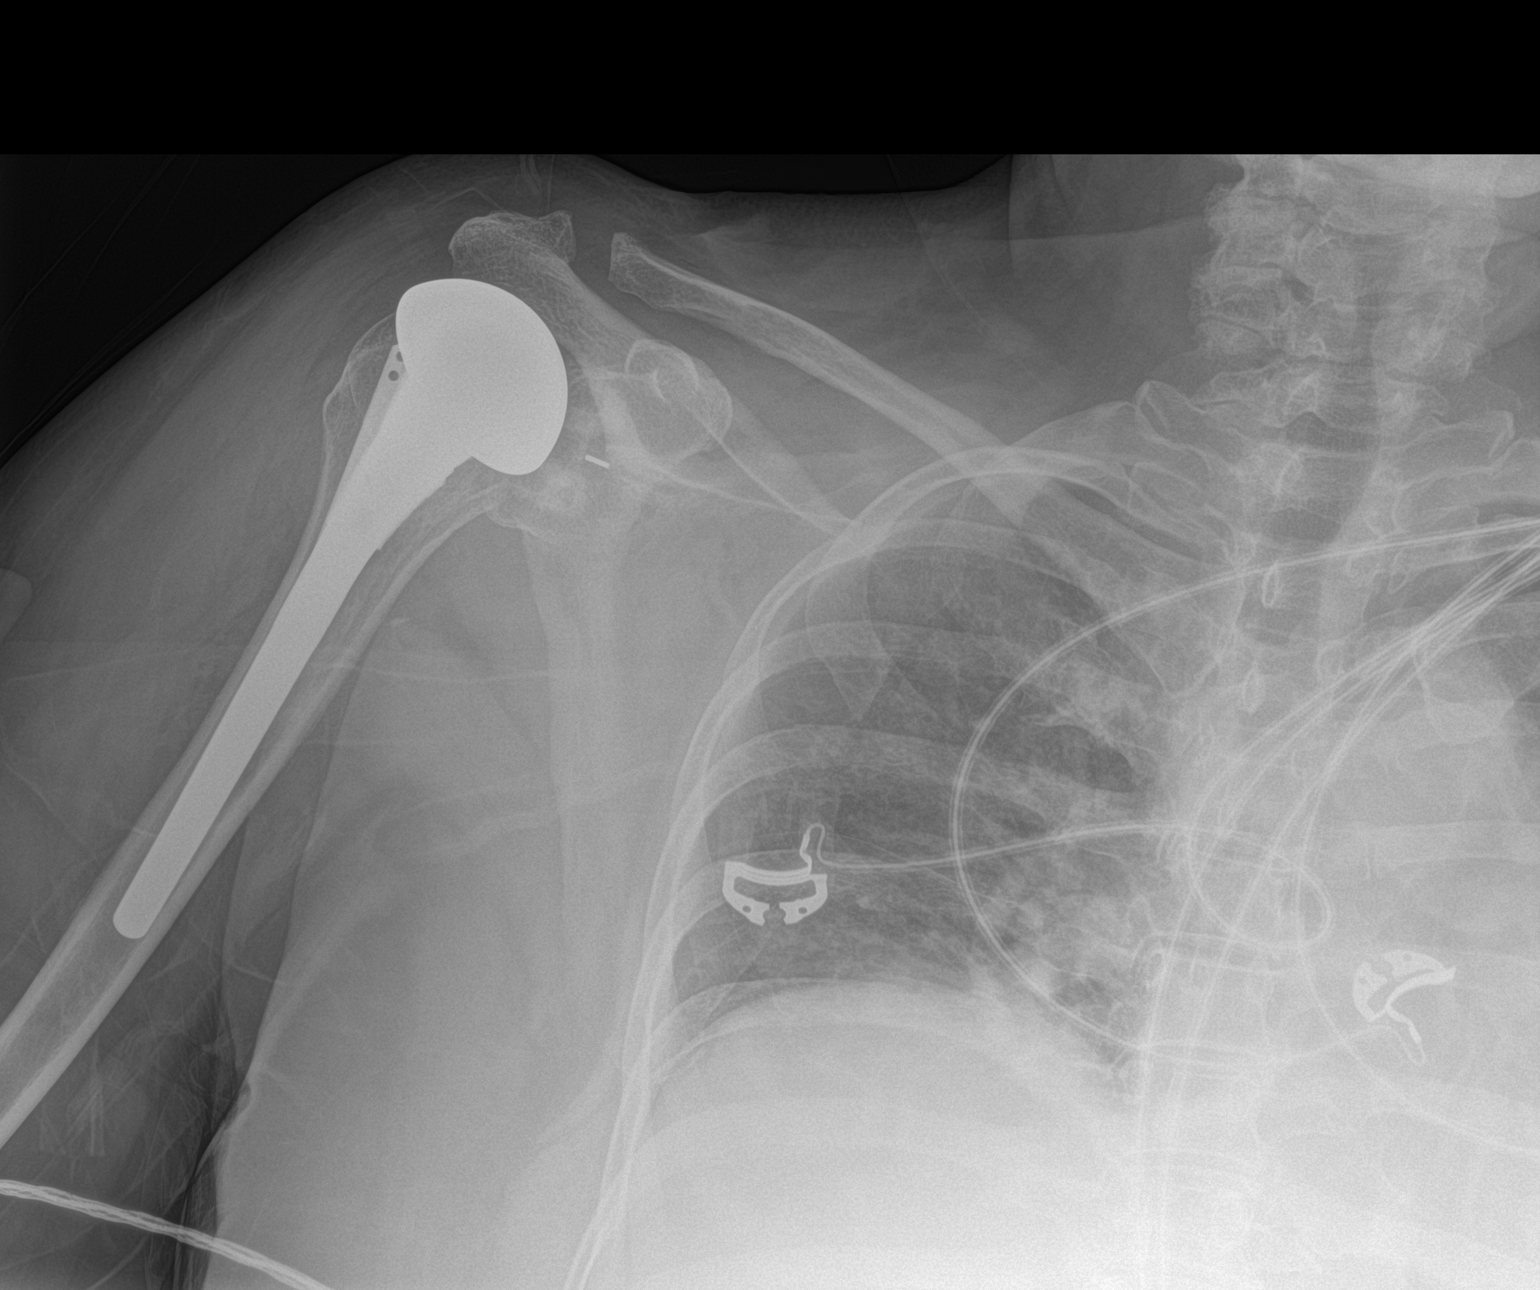

[1 of 1 positions shown; findings below may reference images not displayed]

FINDINGS: Right shoulder arthroplasty noted, no periprosthetic fracture,
malalignment, or acute complicating feature.
IMPRESSION: 1. Single view of the right shoulder demonstrates right shoulder
arthroplasty without early complicating feature.

## 2021-12-22 SURGERY — ARTHROPLASTY, SHOULDER, TOTAL
Anesthesia: General | Site: Shoulder | Laterality: Right

## 2021-12-22 MED ORDER — ORAL CARE MOUTH RINSE: 15.0000 mL | Freq: Once | OROMUCOSAL | Status: AC

## 2021-12-22 MED ORDER — METOCLOPRAMIDE HCL 5 MG PO TABS: 5.0000 mg | ORAL_TABLET | Freq: Three times a day (TID) | ORAL | Status: DC | PRN

## 2021-12-22 MED ORDER — ROCURONIUM BROMIDE 10 MG/ML (PF) SYRINGE
PREFILLED_SYRINGE | INTRAVENOUS | Status: DC | PRN
  Administered 2021-12-22: 80 mg via INTRAVENOUS

## 2021-12-22 MED ORDER — FENTANYL CITRATE PF 50 MCG/ML IJ SOSY
50.0000 ug | PREFILLED_SYRINGE | INTRAMUSCULAR | Status: DC
  Administered 2021-12-22: 50 ug via INTRAVENOUS
  Filled 2021-12-22: qty 2

## 2021-12-22 MED ORDER — BISACODYL 10 MG RE SUPP: 10.0000 mg | Freq: Every day | RECTAL | Status: DC | PRN

## 2021-12-22 MED ORDER — ONDANSETRON HCL 4 MG PO TABS: 4.0000 mg | ORAL_TABLET | Freq: Four times a day (QID) | ORAL | Status: DC | PRN

## 2021-12-22 MED ORDER — SUGAMMADEX SODIUM 500 MG/5ML IV SOLN
INTRAVENOUS | Status: AC
  Filled 2021-12-22: qty 10

## 2021-12-22 MED ORDER — VALACYCLOVIR HCL 500 MG PO TABS: 500.0000 mg | ORAL_TABLET | Freq: Two times a day (BID) | ORAL | Status: DC | PRN

## 2021-12-22 MED ORDER — FENTANYL CITRATE (PF) 100 MCG/2ML IJ SOLN
INTRAMUSCULAR | Status: AC
  Filled 2021-12-22: qty 2

## 2021-12-22 MED ORDER — BUPIVACAINE LIPOSOME 1.3 % IJ SUSP
INTRAMUSCULAR | Status: DC | PRN
  Administered 2021-12-22: 10 mL via PERINEURAL

## 2021-12-22 MED ORDER — MIDAZOLAM HCL 2 MG/2ML IJ SOLN
1.0000 mg | INTRAMUSCULAR | Status: DC
  Administered 2021-12-22: 2 mg via INTRAVENOUS
  Filled 2021-12-22: qty 2

## 2021-12-22 MED ORDER — TURMERIC 500 MG PO CAPS: 2000.0000 mg | ORAL_CAPSULE | Freq: Every morning | ORAL | Status: DC

## 2021-12-22 MED ORDER — ONDANSETRON HCL 4 MG/2ML IJ SOLN
INTRAMUSCULAR | Status: DC | PRN
  Administered 2021-12-22 (×2): 4 mg via INTRAVENOUS

## 2021-12-22 MED ORDER — ONDANSETRON HCL 4 MG/2ML IJ SOLN
INTRAMUSCULAR | Status: AC
  Filled 2021-12-22: qty 4

## 2021-12-22 MED ORDER — CHLORHEXIDINE GLUCONATE 0.12 % MT SOLN
15.0000 mL | Freq: Once | OROMUCOSAL | Status: AC
  Administered 2021-12-22: 15 mL via OROMUCOSAL

## 2021-12-22 MED ORDER — ROCURONIUM BROMIDE 10 MG/ML (PF) SYRINGE
PREFILLED_SYRINGE | INTRAVENOUS | Status: AC
  Filled 2021-12-22: qty 10

## 2021-12-22 MED ORDER — PHENYLEPHRINE HCL-NACL 20-0.9 MG/250ML-% IV SOLN
INTRAVENOUS | Status: DC | PRN
  Administered 2021-12-22: 100 ug/min via INTRAVENOUS

## 2021-12-22 MED ORDER — 0.9 % SODIUM CHLORIDE (POUR BTL) OPTIME
TOPICAL | Status: DC | PRN
  Administered 2021-12-22: 1000 mL

## 2021-12-22 MED ORDER — PROPOFOL 10 MG/ML IV BOLUS
INTRAVENOUS | Status: AC
  Filled 2021-12-22: qty 20

## 2021-12-22 MED ORDER — PHENTERMINE HCL 37.5 MG PO TABS: 37.5000 mg | ORAL_TABLET | Freq: Every day | ORAL | Status: DC

## 2021-12-22 MED ORDER — BUPIVACAINE-EPINEPHRINE (PF) 0.25% -1:200000 IJ SOLN
INTRAMUSCULAR | Status: AC
  Filled 2021-12-22: qty 30

## 2021-12-22 MED ORDER — ONDANSETRON HCL 4 MG/2ML IJ SOLN
4.0000 mg | Freq: Once | INTRAMUSCULAR | Status: DC | PRN
Start: 2021-12-22 — End: 2021-12-22

## 2021-12-22 MED ORDER — BUPIVACAINE-EPINEPHRINE (PF) 0.25% -1:200000 IJ SOLN
INTRAMUSCULAR | Status: DC | PRN
  Administered 2021-12-22: 15 mL

## 2021-12-22 MED ORDER — BUPIVACAINE HCL (PF) 0.5 % IJ SOLN
INTRAMUSCULAR | Status: DC | PRN
  Administered 2021-12-22: 15 mL via PERINEURAL

## 2021-12-22 MED ORDER — PSYLLIUM 95 % PO PACK
1.0000 | PACK | Freq: Every day | ORAL | Status: DC
  Administered 2021-12-23: 1 via ORAL
  Filled 2021-12-22: qty 1

## 2021-12-22 MED ORDER — PROPOFOL 1000 MG/100ML IV EMUL
INTRAVENOUS | Status: AC
Start: 2021-12-22 — End: ?
  Filled 2021-12-22: qty 200

## 2021-12-22 MED ORDER — DULOXETINE HCL 60 MG PO CPEP
60.0000 mg | ORAL_CAPSULE | Freq: Every day | ORAL | Status: DC
  Administered 2021-12-22 – 2021-12-23 (×2): 60 mg via ORAL
  Filled 2021-12-22 (×2): qty 1

## 2021-12-22 MED ORDER — LACTATED RINGERS IV SOLN: INTRAVENOUS | Status: DC

## 2021-12-22 MED ORDER — TRAZODONE HCL 100 MG PO TABS
200.0000 mg | ORAL_TABLET | Freq: Every evening | ORAL | Status: DC | PRN
  Filled 2021-12-22: qty 2

## 2021-12-22 MED ORDER — THROMBIN (RECOMBINANT) 5000 UNITS EX SOLR
CUTANEOUS | Status: AC
  Filled 2021-12-22: qty 5000

## 2021-12-22 MED ORDER — EZETIMIBE 10 MG PO TABS
10.0000 mg | ORAL_TABLET | Freq: Every day | ORAL | Status: DC
  Administered 2021-12-23: 10 mg via ORAL
  Filled 2021-12-22: qty 1

## 2021-12-22 MED ORDER — DOCUSATE SODIUM 100 MG PO CAPS
100.0000 mg | ORAL_CAPSULE | Freq: Two times a day (BID) | ORAL | Status: DC
  Administered 2021-12-22 – 2021-12-23 (×2): 100 mg via ORAL
  Filled 2021-12-22 (×2): qty 1

## 2021-12-22 MED ORDER — MENTHOL 3 MG MT LOZG
1.0000 | LOZENGE | OROMUCOSAL | Status: DC | PRN
  Administered 2021-12-22: 3 mg via ORAL
  Filled 2021-12-22: qty 9

## 2021-12-22 MED ORDER — ONDANSETRON HCL 4 MG/2ML IJ SOLN
4.0000 mg | Freq: Four times a day (QID) | INTRAMUSCULAR | Status: DC | PRN
  Administered 2021-12-22: 4 mg via INTRAVENOUS
  Filled 2021-12-22: qty 2

## 2021-12-22 MED ORDER — OXYCODONE-ACETAMINOPHEN 5-325 MG PO TABS
1.0000 | ORAL_TABLET | ORAL | Status: DC | PRN
  Administered 2021-12-22 – 2021-12-23 (×4): 1 via ORAL
  Filled 2021-12-22 (×4): qty 1

## 2021-12-22 MED ORDER — PROPOFOL 10 MG/ML IV BOLUS
INTRAVENOUS | Status: DC | PRN
  Administered 2021-12-22: 50 mg via INTRAVENOUS
  Administered 2021-12-22: 150 mg via INTRAVENOUS

## 2021-12-22 MED ORDER — DEXMEDETOMIDINE (PRECEDEX) IN NS 20 MCG/5ML (4 MCG/ML) IV SYRINGE
PREFILLED_SYRINGE | INTRAVENOUS | Status: DC | PRN
  Administered 2021-12-22: 12 ug via INTRAVENOUS

## 2021-12-22 MED ORDER — THROMBIN 5000 UNITS EX SOLR
CUTANEOUS | Status: DC | PRN
  Administered 2021-12-22: 5000 [IU] via TOPICAL

## 2021-12-22 MED ORDER — DICLOFENAC SODIUM 1 % EX GEL
2.0000 g | Freq: Four times a day (QID) | CUTANEOUS | Status: DC
  Administered 2021-12-22 – 2021-12-23 (×2): 2 g via TOPICAL
  Filled 2021-12-22: qty 100

## 2021-12-22 MED ORDER — EPHEDRINE 5 MG/ML INJ
INTRAVENOUS | Status: AC
  Filled 2021-12-22: qty 5

## 2021-12-22 MED ORDER — SUGAMMADEX SODIUM 200 MG/2ML IV SOLN
INTRAVENOUS | Status: DC | PRN
  Administered 2021-12-22: 200 mg via INTRAVENOUS
  Administered 2021-12-22: 100 mg via INTRAVENOUS

## 2021-12-22 MED ORDER — PROPOFOL 10 MG/ML IV BOLUS
INTRAVENOUS | Status: AC
Start: 2021-12-22 — End: ?
  Filled 2021-12-22: qty 20

## 2021-12-22 MED ORDER — VANCOMYCIN HCL IN DEXTROSE 1-5 GM/200ML-% IV SOLN
1000.0000 mg | Freq: Two times a day (BID) | INTRAVENOUS | Status: AC
  Administered 2021-12-22: 1000 mg via INTRAVENOUS
  Filled 2021-12-22: qty 200

## 2021-12-22 MED ORDER — PHENYLEPHRINE 80 MCG/ML (10ML) SYRINGE FOR IV PUSH (FOR BLOOD PRESSURE SUPPORT)
PREFILLED_SYRINGE | INTRAVENOUS | Status: AC
  Filled 2021-12-22: qty 10

## 2021-12-22 MED ORDER — ACETAMINOPHEN 10 MG/ML IV SOLN: 1000.0000 mg | Freq: Once | INTRAVENOUS | Status: DC | PRN

## 2021-12-22 MED ORDER — PREGABALIN 100 MG PO CAPS
100.0000 mg | ORAL_CAPSULE | Freq: Two times a day (BID) | ORAL | Status: DC
  Administered 2021-12-22 – 2021-12-23 (×2): 100 mg via ORAL
  Filled 2021-12-22 (×2): qty 1

## 2021-12-22 MED ORDER — FENTANYL CITRATE (PF) 100 MCG/2ML IJ SOLN
INTRAMUSCULAR | Status: DC | PRN
  Administered 2021-12-22 (×2): 50 ug via INTRAVENOUS

## 2021-12-22 MED ORDER — ESTRADIOL 0.1 MG/GM VA CREA
1.0000 | TOPICAL_CREAM | VAGINAL | Status: DC
Start: 2021-12-22 — End: 2021-12-22

## 2021-12-22 MED ORDER — AMISULPRIDE (ANTIEMETIC) 5 MG/2ML IV SOLN: 10.0000 mg | Freq: Once | INTRAVENOUS | Status: DC | PRN

## 2021-12-22 MED ORDER — LORATADINE 10 MG PO TABS
10.0000 mg | ORAL_TABLET | Freq: Every day | ORAL | Status: DC
  Administered 2021-12-23: 10 mg via ORAL
  Filled 2021-12-22: qty 1

## 2021-12-22 MED ORDER — SODIUM CHLORIDE 0.9 % IV SOLN: INTRAVENOUS | Status: DC

## 2021-12-22 MED ORDER — VANCOMYCIN HCL IN DEXTROSE 1-5 GM/200ML-% IV SOLN
1000.0000 mg | INTRAVENOUS | Status: AC
  Administered 2021-12-22: 1000 mg via INTRAVENOUS
  Filled 2021-12-22: qty 200

## 2021-12-22 MED ORDER — FAMOTIDINE 20 MG PO TABS
20.0000 mg | ORAL_TABLET | Freq: Two times a day (BID) | ORAL | Status: DC
  Administered 2021-12-22 – 2021-12-23 (×2): 20 mg via ORAL
  Filled 2021-12-22 (×2): qty 1

## 2021-12-22 MED ORDER — ACETAMINOPHEN 500 MG PO TABS: 1000.0000 mg | ORAL_TABLET | Freq: Three times a day (TID) | ORAL | Status: DC | PRN

## 2021-12-22 MED ORDER — METOCLOPRAMIDE HCL 5 MG/ML IJ SOLN: 5.0000 mg | Freq: Three times a day (TID) | INTRAMUSCULAR | Status: DC | PRN

## 2021-12-22 MED ORDER — PROPOFOL 500 MG/50ML IV EMUL
INTRAVENOUS | Status: DC | PRN
  Administered 2021-12-22: 100 ug/kg/min via INTRAVENOUS

## 2021-12-22 MED ORDER — BACLOFEN 10 MG PO TABS
10.0000 mg | ORAL_TABLET | Freq: Three times a day (TID) | ORAL | 1 refills | Status: AC | PRN
Start: 2021-12-22 — End: ?

## 2021-12-22 MED ORDER — HYDROMORPHONE HCL 1 MG/ML IJ SOLN: 0.2500 mg | INTRAMUSCULAR | Status: DC | PRN

## 2021-12-22 MED ORDER — HYDROXYZINE HCL 10 MG PO TABS
10.0000 mg | ORAL_TABLET | Freq: Every day | ORAL | Status: DC | PRN
Start: 2021-12-22 — End: 2021-12-23
  Administered 2021-12-22: 10 mg via ORAL
  Filled 2021-12-22 (×2): qty 1

## 2021-12-22 MED ORDER — BACLOFEN 10 MG PO TABS
10.0000 mg | ORAL_TABLET | Freq: Three times a day (TID) | ORAL | Status: DC | PRN
  Administered 2021-12-22 (×2): 10 mg via ORAL
  Filled 2021-12-22 (×2): qty 1

## 2021-12-22 MED ORDER — FLUTICASONE PROPIONATE 50 MCG/ACT NA SUSP: 1.0000 | Freq: Every day | NASAL | Status: DC | PRN

## 2021-12-22 MED ORDER — OXYCODONE-ACETAMINOPHEN 10-325 MG PO TABS: 1.0000 | ORAL_TABLET | ORAL | Status: DC | PRN

## 2021-12-22 MED ORDER — BUPIVACAINE HCL (PF) 0.5 % IJ SOLN
INTRAMUSCULAR | Status: AC
  Filled 2021-12-22: qty 30

## 2021-12-22 MED ORDER — BUPRENORPHINE HCL 450 MCG BU FILM: 450.0000 ug | ORAL_FILM | Freq: Every evening | BUCCAL | Status: DC

## 2021-12-22 MED ORDER — STERILE WATER FOR IRRIGATION IR SOLN
Status: DC | PRN
  Administered 2021-12-22: 2000 mL

## 2021-12-22 MED ORDER — OXYCODONE HCL 5 MG PO TABS
5.0000 mg | ORAL_TABLET | ORAL | Status: DC | PRN
  Administered 2021-12-22 – 2021-12-23 (×2): 5 mg via ORAL
  Filled 2021-12-22 (×2): qty 1

## 2021-12-22 MED ORDER — PHENYLEPHRINE HCL (PRESSORS) 10 MG/ML IV SOLN
INTRAVENOUS | Status: AC
  Filled 2021-12-22: qty 1

## 2021-12-22 MED ORDER — PROPOFOL 500 MG/50ML IV EMUL
INTRAVENOUS | Status: AC
  Filled 2021-12-22: qty 50

## 2021-12-22 MED ORDER — OXYCODONE HCL 5 MG PO TABS: 5.0000 mg | ORAL_TABLET | Freq: Once | ORAL | Status: DC | PRN

## 2021-12-22 MED ORDER — LIDOCAINE HCL (PF) 2 % IJ SOLN
INTRAMUSCULAR | Status: AC
  Filled 2021-12-22: qty 10

## 2021-12-22 MED ORDER — PHENOL 1.4 % MT LIQD: 1.0000 | OROMUCOSAL | Status: DC | PRN

## 2021-12-22 MED ORDER — RIVAROXABAN 10 MG PO TABS
10.0000 mg | ORAL_TABLET | Freq: Every day | ORAL | Status: DC
  Administered 2021-12-23: 10 mg via ORAL
  Filled 2021-12-22: qty 1

## 2021-12-22 MED ORDER — HYDROMORPHONE HCL 1 MG/ML IJ SOLN
1.0000 mg | INTRAMUSCULAR | Status: DC | PRN
  Administered 2021-12-22: 1 mg via INTRAVENOUS
  Filled 2021-12-22: qty 1

## 2021-12-22 MED ORDER — PANTOPRAZOLE SODIUM 40 MG PO TBEC
40.0000 mg | DELAYED_RELEASE_TABLET | Freq: Every day | ORAL | Status: DC
  Administered 2021-12-23: 40 mg via ORAL
  Filled 2021-12-22: qty 1

## 2021-12-22 MED ORDER — SUCCINYLCHOLINE CHLORIDE 200 MG/10ML IV SOSY
PREFILLED_SYRINGE | INTRAVENOUS | Status: AC
  Filled 2021-12-22: qty 10

## 2021-12-22 MED ORDER — OXYCODONE HCL 5 MG/5ML PO SOLN: 5.0000 mg | Freq: Once | ORAL | Status: DC | PRN

## 2021-12-22 MED ORDER — ONDANSETRON 4 MG PO TBDP: 8.0000 mg | ORAL_TABLET | Freq: Three times a day (TID) | ORAL | Status: DC | PRN

## 2021-12-22 SURGICAL SUPPLY — 60 items
BAG COUNTER SPONGE SURGICOUNT (BAG) ×1 IMPLANT
BAG ZIPLOCK 12X15 (MISCELLANEOUS) IMPLANT
BIT DRILL 1.6MX128 (BIT) ×2 IMPLANT
BLADE SAG 18X100X1.27 (BLADE) ×2 IMPLANT
BODY ANATOMIC PROXIMAL SZ10 (Shoulder) ×1 IMPLANT
CEMENT HV SMART SET (Cement) ×2 IMPLANT
COVER BACK TABLE 60X90IN (DRAPES) ×2 IMPLANT
COVER SURGICAL LIGHT HANDLE (MISCELLANEOUS) ×2 IMPLANT
DRAPE INCISE IOBAN 66X45 STRL (DRAPES) ×2 IMPLANT
DRAPE ORTHO SPLIT 77X108 STRL (DRAPES) ×2
DRAPE SHEET LG 3/4 BI-LAMINATE (DRAPES) ×2 IMPLANT
DRAPE SURG ORHT 6 SPLT 77X108 (DRAPES) ×2 IMPLANT
DRAPE U-SHAPE 47X51 STRL (DRAPES) ×2 IMPLANT
DRSG ADAPTIC 3X8 NADH LF (GAUZE/BANDAGES/DRESSINGS) ×2 IMPLANT
DRSG PAD ABDOMINAL 8X10 ST (GAUZE/BANDAGES/DRESSINGS) ×2 IMPLANT
DURAPREP 26ML APPLICATOR (WOUND CARE) ×2 IMPLANT
ELECT BLADE TIP CTD 4 INCH (ELECTRODE) ×2 IMPLANT
ELECT NDL TIP 2.8 STRL (NEEDLE) ×1 IMPLANT
ELECT NEEDLE TIP 2.8 STRL (NEEDLE) ×2 IMPLANT
ELECT REM PT RETURN 15FT ADLT (MISCELLANEOUS) ×2 IMPLANT
GAUZE SPONGE 4X4 12PLY STRL (GAUZE/BANDAGES/DRESSINGS) ×2 IMPLANT
GLENOID ANCHOR PEG CROSSLK 44 (Orthopedic Implant) ×1 IMPLANT
GLOVE BIOGEL PI IND STRL 7.5 (GLOVE) ×1 IMPLANT
GLOVE BIOGEL PI IND STRL 8.5 (GLOVE) ×1 IMPLANT
GLOVE BIOGEL PI INDICATOR 7.5 (GLOVE) ×1
GLOVE BIOGEL PI INDICATOR 8.5 (GLOVE) ×1
GLOVE ORTHO TXT STRL SZ7.5 (GLOVE) ×2 IMPLANT
GLOVE SURG ORTHO 8.5 STRL (GLOVE) ×2 IMPLANT
GOWN STRL REUS W/ TWL XL LVL3 (GOWN DISPOSABLE) ×2 IMPLANT
GOWN STRL REUS W/TWL XL LVL3 (GOWN DISPOSABLE) ×2
HEAD HUMERAL ECC 44X18MM SHLDR (Head) ×1 IMPLANT
KIT BASIN OR (CUSTOM PROCEDURE TRAY) ×2 IMPLANT
KIT TURNOVER KIT A (KITS) ×1 IMPLANT
MANIFOLD NEPTUNE II (INSTRUMENTS) ×2 IMPLANT
NDL MAYO CATGUT SZ4 TPR NDL (NEEDLE) ×1 IMPLANT
NEEDLE MAYO CATGUT SZ4 (NEEDLE) ×2 IMPLANT
PACK SHOULDER (CUSTOM PROCEDURE TRAY) ×2 IMPLANT
PASSER SUT SWANSON 36MM LOOP (INSTRUMENTS) IMPLANT
PIN METAGLENE 2.5 (PIN) ×1 IMPLANT
PROTECTOR NERVE ULNAR (MISCELLANEOUS) ×2 IMPLANT
RESTRAINT HEAD UNIVERSAL NS (MISCELLANEOUS) ×2 IMPLANT
SLING ARM FOAM STRAP LRG (SOFTGOODS) ×2 IMPLANT
SMARTMIX MINI TOWER (MISCELLANEOUS) ×2
SPIKE FLUID TRANSFER (MISCELLANEOUS) ×2 IMPLANT
SPONGE SURGIFOAM ABS GEL 12-7 (HEMOSTASIS) ×2 IMPLANT
SPONGE T-LAP 4X18 ~~LOC~~+RFID (SPONGE) ×4 IMPLANT
STEM STANDARD SZ 10 113MM (Stem) ×2 IMPLANT
STEM STD SZ 10 113MM (Stem) IMPLANT
STRIP CLOSURE SKIN 1/2X4 (GAUZE/BANDAGES/DRESSINGS) ×2 IMPLANT
SUCTION FRAZIER HANDLE 12FR (TUBING) ×1
SUCTION TUBE FRAZIER 12FR DISP (TUBING) ×1 IMPLANT
SUT FIBERWIRE #2 38 T-5 BLUE (SUTURE) ×8
SUT MNCRL AB 4-0 PS2 18 (SUTURE) ×2 IMPLANT
SUT VIC AB 0 CT1 36 (SUTURE) ×2 IMPLANT
SUT VIC AB 0 CT2 27 (SUTURE) ×2 IMPLANT
SUT VIC AB 2-0 CT1 27 (SUTURE) ×1
SUT VIC AB 2-0 CT1 TAPERPNT 27 (SUTURE) ×1 IMPLANT
SUTURE FIBERWR #2 38 T-5 BLUE (SUTURE) ×4 IMPLANT
TOWEL OR 17X26 10 PK STRL BLUE (TOWEL DISPOSABLE) ×2 IMPLANT
TOWER SMARTMIX MINI (MISCELLANEOUS) ×1 IMPLANT

## 2021-12-22 NOTE — Interval H&P Note (Signed)
History and Physical Interval Note:  12/22/2021 9:10 AM  Maureen Randall  has presented today for surgery, with the diagnosis of right shoulder endstage osteoarthritis.  The various methods of treatment have been discussed with the patient and family. After consideration of risks, benefits and other options for treatment, the patient has consented to  Procedure(s) with comments: TOTAL SHOULDER ARTHROPLASTY (Right) - with ISB as a surgical intervention.  The patient's history has been reviewed, patient examined, no change in status, stable for surgery.  I have reviewed the patient's chart and labs.  Questions were answered to the patient's satisfaction.     Augustin Schooling

## 2021-12-22 NOTE — Anesthesia Procedure Notes (Signed)
Procedure Name: Intubation Date/Time: 12/22/2021 10:09 AM  Performed by: Cynda Familia, CRNAPre-anesthesia Checklist: Patient identified, Emergency Drugs available, Suction available and Patient being monitored Patient Re-evaluated:Patient Re-evaluated prior to induction Oxygen Delivery Method: Circle System Utilized Preoxygenation: Pre-oxygenation with 100% oxygen Induction Type: IV induction Ventilation: Mask ventilation without difficulty Laryngoscope Size: Miller and 2 Grade View: Grade I Tube type: Oral Number of attempts: 1 Airway Equipment and Method: Stylet Placement Confirmation: ETT inserted through vocal cords under direct vision, positive ETCO2 and breath sounds checked- equal and bilateral Secured at: 21 cm Tube secured with: Tape Dental Injury: Teeth and Oropharynx as per pre-operative assessment  Comments: IV induction Tobias Alexander--- intubation AM CRNA atraumatic-- teeth and mouth as preop-- bilat BS

## 2021-12-22 NOTE — Anesthesia Procedure Notes (Addendum)
Anesthesia Regional Block: Interscalene brachial plexus block   Pre-Anesthetic Checklist: , timeout performed,  Correct Patient, Correct Site, Correct Laterality,  Correct Procedure, Correct Position, site marked,  Risks and benefits discussed,  Surgical consent,  Pre-op evaluation,  At surgeon's request and post-op pain management  Laterality: Upper and Right  Prep: Maximum Sterile Barrier Precautions used, chloraprep       Needles:  Injection technique: Single-shot  Needle Type: Echogenic Needle     Needle Length: 5cm  Needle Gauge: 21     Additional Needles:   Procedures:,,,, ultrasound used (permanent image in chart),,    Narrative:  Start time: 12/22/2021 8:48 AM End time: 12/22/2021 8:55 AM Injection made incrementally with aspirations every 5 mL.  Performed by: Personally  Anesthesiologist: Barnet Glasgow, MD  Additional Notes: Block assessed prior to procedure. Patient tolerated procedure well.

## 2021-12-22 NOTE — Anesthesia Procedure Notes (Signed)
Date/Time: 12/22/2021 12:23 PM  Performed by: Cynda Familia, CRNAOxygen Delivery Method: Simple face mask Placement Confirmation: positive ETCO2 and breath sounds checked- equal and bilateral Dental Injury: Teeth and Oropharynx as per pre-operative assessment

## 2021-12-22 NOTE — Transfer of Care (Signed)
Immediate Anesthesia Transfer of Care Note  Patient: [name]  Procedure(s) Performed: TOTAL SHOULDER ARTHROPLASTY (Right: Shoulder)  Patient Location: PACU  Anesthesia Type:General  Level of Consciousness: awake and alert   Airway & Oxygen Therapy: Patient Spontanous Breathing and Patient connected to face mask oxygen  Post-op Assessment: Report given to RN and Post -op Vital signs reviewed and stable  Post vital signs: Reviewed and stable  Last Vitals:  Vitals Value Taken Time  BP 130/90 12/22/21 1230  Temp    Pulse 76 12/22/21 1232  Resp 13 12/22/21 1232  SpO2 94 % 12/22/21 1232  Vitals shown include unvalidated device data.  Last Pain:  Vitals:   12/22/21 0900  TempSrc:   PainSc: 0-No pain      Patients Stated Pain Goal: 4 (45/40/98 1191)  Complications: No notable events documented.

## 2021-12-22 NOTE — Brief Op Note (Signed)
12/22/2021  12:14 PM  PATIENT:  Maureen Randall  56 y.o. female  PRE-OPERATIVE DIAGNOSIS:  right shoulder endstage osteoarthritis  POST-OPERATIVE DIAGNOSIS:  right shoulder endstage osteoarthritis  PROCEDURE:  Procedure(s) with comments: TOTAL SHOULDER ARTHROPLASTY (Right) - with ISB DePuy Global Unite with APG glenoid  SURGEON:  Surgeon(s) and Role:    Netta Cedars, MD - Primary  PHYSICIAN ASSISTANT:   ASSISTANTS: [place] Bruns, PA-C   ANESTHESIA:   regional and general  EBL:  50 mL   BLOOD ADMINISTERED:none  DRAINS: none   LOCAL MEDICATIONS USED:  MARCAINE     SPECIMEN:  No Specimen  DISPOSITION OF SPECIMEN:  none  COUNTS:  YES  TOURNIQUET:  * No tourniquets in log *  DICTATION: .Other Dictation: Dictation Number [id number]  PLAN OF CARE: Admit for overnight observation  PATIENT DISPOSITION:  PACU - hemodynamically stable.   Delay start of Pharmacological VTE agent (>24hrs) due to surgical blood loss or risk of bleeding: not applicable

## 2021-12-22 NOTE — Op Note (Signed)
NAMEYEN, WANDELL MEDICAL RECORD NO: [id number] ACCOUNT NO: [id number] DATE OF BIRTH: 24-Sep-1965 FACILITY: [facility] PHYSICIAN: Doran Heater. [name], MD  Operative Report   DATE OF PROCEDURE: 12/22/2021  PREOPERATIVE DIAGNOSIS:  Right shoulder end-stage arthritis.  POSTOPERATIVE DIAGNOSIS:  Right shoulder end-stage arthritis.  PROCEDURE PERFORMED:  Right total shoulder arthroplasty using DePuy [employer] Unite system with APG glenoid.  ATTENDING SURGEON:  Doran Heater. [name], MD  ASSISTANT:  Charletta Cousin [place], [place], who was scrubbed during the entire procedure, and necessary for satisfactory completion of surgery.  ANESTHESIA:  General anesthesia was used plus interscalene block.  ESTIMATED BLOOD LOSS:  Minimal.  FLUID REPLACEMENT:  1500 mL crystalloid.  INSTRUMENT COUNTS:  Correct.  COMPLICATIONS:  No complications.  ANTIBIOTICS:  Perioperative antibiotics were given.  INDICATIONS:  The patient is a 56 year old female who presents with a history of worsening right shoulder pain due to end-stage arthritis.  The patient has failed an extended period of conservative management, presents for operative treatment to relieve  pain and restore function.  Informed consent obtained.  DESCRIPTION OF PROCEDURE:  After an adequate level of anesthesia was achieved, the patient positioned in the modified beach chair position.  Right shoulder correctly identified and sterilely prepped and draped in the usual manner.  Timeout called,  verifying correct patient, correct site. We entered the patient's shoulder using a standard deltopectoral approach, starting at the coracoid process extending down to the anterior humerus.  Dissection down through subcutaneous tissues using Bovie.  We  went ahead and identified the cephalic vein and took that laterally with the deltoid.  Pectoralis was taken medially and retracted.  We identified the conjoined tendon, retracted that medially.  We then tenodesed  the biceps in situ with figure-of-eight  suture x2.  Next, we went ahead and released the subscapularis subperiosteally off the lesser tuberosity.  We tagged with #2 FiberWire suture in a modified Mason-Allen suture technique for repair at the end.  We did remove some suture from the patient's  prior rotator cuff surgery.  At this point, we progressively externally rotated, releasing the capsule inferiorly.  We then placed our T-handle Crego superiorly underneath the rotator cuff and over the top of the humeral head and then the reverse Hohmann  medially, took the patient's elbow at her side and then externally rotate about 30 degrees to make our anterior to posterior 135 cut with the oscillating saw using the neck cut guide.  Once we did that, we went ahead and removed excess osteophytes  medially with a rongeur.  We then subluxed the humerus posteriorly getting good glenoid exposure, removed the capsule and the labrum protecting the axillary nerve throughout the surgery, we placed our deep retractors.  We then removed the remaining  cartilage off the glenoid, which was not much with the Cobb elevator.  We then placed our guide pin for a 44 glenoid preparation.  We then reamed for the 44 glenoid, did our peripheral hand reaming.  We then drilled our central peg hole for the APG  glenoid.  We then referencing off the 6 o'clock position, placed our 3 peripheral drill holes using the guide.  Once we had all of our preparation done for the glenoid we placed the trial 44 APG glenoid in place and impacted that we had good support for  that implant.  We removed the trial.  We then irrigated and then placed Gelfoam soaked with thrombin in the 3 peripheral holes while we vacuum mixed high viscosity  cement on the back table.  We cemented the APG glenoid and placed just using cement on the  3 peripheral holes and we held the pressure on the glenoid until the cement hardened.  Next, we checked the security of the  glenoid, which was stable, we went back to the humeral side, we reamed up to a size 10 intramedullary diameter and then we  broached for the 10 APG stem.  Once we had that broach in place, we placed a 44 x 18 eccentric head, best fit over the cut surface of the humeral head.  We had nice coverage.  We reduced the shoulder, had excellent soft tissue balancing, was able to  displace the humerus posteriorly and it relocated spontaneously.  We could also displace it superiorly about 50% of the width of the glenoid.  We removed all trial components from the humeral side.  We drilled holes in the lesser tuberosity and placed #2  FiberWire suture for repair of the subscap.  Once we had our FiberWire in place we irrigated thoroughly and then used impaction grafting technique with available bone graft and then our [place] size 10 stem and a size 10 metaphysis impacted in about 25  degrees of retroversion.  Once we had that stem in place, we again were happy with the placement of the stem security of it, so we selected the real 44 18 eccentric head, dialing that eccentricity superiorly and slightly posteriorly we impacted that had  onto the trunnion.  It was stable.  We then went ahead and reduced the shoulder.  We were pleased with that stability throughout a full range of motion, did a finger sweep to make sure we had no scar tissue caught up in the patient's prior rotator cuff  repair.  We irrigated thoroughly and then repaired the subscapularis anatomically back to bone including rotator interval repair.  We had excellent range of motion after repair, were pleased with low profile and basically anatomic.  At this point, we  irrigated again and then did a deltopectoral closure with 0 Vicryl suture followed by 2-0 Vicryl for subcutaneous closure and 4-0 Monocryl for skin.  Steri-Strips applied followed by sterile dressing.  The patient tolerated surgery well.   SHY D: 12/22/2021 12:21:30 pm T: 12/22/2021 2:52:00  pm  JOB: [id number]/ [id number]

## 2021-12-22 NOTE — Discharge Instructions (Signed)
Ice to the shoulder constantly.  Keep the incision covered and clean and dry for one week, then ok to get it wet in the shower. ° °Do exercise as instructed several times per day. ° °DO NOT reach behind your back or push up out of a chair with the operative arm. ° °Use a sling while you are up and around for comfort, may remove while seated.  Keep pillow propped behind the operative elbow. ° °Follow up with Dr Danaye Sobh in two weeks in the office, call [phone] for appt °

## 2021-12-22 NOTE — Anesthesia Postprocedure Evaluation (Signed)
Anesthesia Post Note  Patient: [name]  Procedure(s) Performed: TOTAL SHOULDER ARTHROPLASTY (Right: Shoulder)     Patient location during evaluation: PACU Anesthesia Type: General Level of consciousness: awake and alert Pain management: pain level controlled Vital Signs Assessment: post-procedure vital signs reviewed and stable Respiratory status: spontaneous breathing, nonlabored ventilation, respiratory function stable and patient connected to nasal cannula oxygen Cardiovascular status: blood pressure returned to baseline and stable Postop Assessment: no apparent nausea or vomiting Anesthetic complications: no   No notable events documented.  Last Vitals:  Vitals:   12/22/21 1345 12/22/21 1406  BP: 116/86 128/80  Pulse: 71 76  Resp: 11   Temp:  36.6 C  SpO2: 91% 96%    Last Pain:  Vitals:   12/22/21 1617  TempSrc:   PainSc: [place]

## 2021-12-22 NOTE — Plan of Care (Signed)

## 2021-12-22 NOTE — Progress Notes (Signed)
Pt has arrived to 1332 from PACU s/p right total shoulder arthroplasty.  Report accepted from [place].  Pt is alert and oriented.  Room orientation completed with call bell at bedside. Daughter at bedside.

## 2021-12-23 DIAGNOSIS — M19011 Primary osteoarthritis, right shoulder: Secondary | ICD-10-CM | POA: Diagnosis not present

## 2021-12-23 MED ORDER — OXYCODONE HCL 5 MG PO TABS: 5.0000 mg | ORAL_TABLET | ORAL | 0 refills | Status: DC | PRN

## 2021-12-23 NOTE — Evaluation (Signed)
Occupational Therapy Evaluation Patient Details Name: Maureen Randall MRN: [id number] DOB: Nov 01, 1965 Today's Date: 12/23/2021   History of Present Illness Patient s/p Right total shoulder arthroplasty   Clinical Impression   Maureen Randall is a 56 year old woman s/p shoulder replacement without functional use of right dominant upper extremity secondary to effects of surgery and interscalene block and shoulder precautions. Therapist provided education and instruction to patient  in regards to exercises, precautions, positioning, donning upper extremity clothing and bathing while maintaining shoulder precautions, ice and edema management and donning/doffing sling. Patient familiar with all education from prior surgeries.Patient to follow up with MD for further therapy needs.        Recommendations for follow up therapy are one component of a multi-disciplinary discharge planning process, led by the attending physician.  Recommendations may be updated based on patient status, additional functional criteria and insurance authorization.   Follow Up Recommendations  Follow physician's recommendations for discharge plan and follow up therapies    Assistance Recommended at Discharge PRN  Patient can return home with the following Assistance with cooking/housework    Functional Status Assessment  Patient has had a recent decline in their functional status and demonstrates the ability to make significant improvements in function in a reasonable and predictable amount of time.  Equipment Recommendations  None recommended by OT    Recommendations for Other Services       Precautions / Restrictions Precautions Precautions: Shoulder Type of Shoulder Precautions: no AROM, No PROM Shoulder Interventions: Off for dressing/bathing/exercises;Shoulder sling/immobilizer Precaution Booklet Issued:  (handouts) Required Braces or Orthoses: Sling Restrictions Weight Bearing Restrictions: Yes RUE  Weight Bearing: Non weight bearing      Mobility Bed Mobility Overal bed mobility: Independent                  Transfers Overall transfer level: Independent                        Balance Overall balance assessment: No apparent balance deficits (not formally assessed)                                         ADL either performed or assessed with clinical judgement   ADL Overall ADL's : Modified independent                                             Vision Baseline Vision/History: 1 Wears glasses       Perception     Praxis      Pertinent Vitals/Pain Pain Assessment Pain Assessment: No/denies pain     Hand Dominance     Extremity/Trunk Assessment Upper Extremity Assessment Upper Extremity Assessment: RUE deficits/detail RUE Deficits / Details: impaired motor and sensation secondary to block   Lower Extremity Assessment Lower Extremity Assessment: Overall WFL for tasks assessed   Cervical / Trunk Assessment Cervical / Trunk Assessment: Normal   Communication     Cognition Arousal/Alertness: Awake/alert Behavior During Therapy: WFL for tasks assessed/performed Overall Cognitive Status: Within Functional Limits for tasks assessed  General Comments       Exercises     Shoulder Instructions Shoulder Instructions Donning/doffing shirt without moving shoulder: Independent Method for sponge bathing under operated UE: Independent Donning/doffing sling/immobilizer: Independent Correct positioning of sling/immobilizer: Independent ROM for elbow, wrist and digits of operated UE: Independent Sling wearing schedule (on at all times/off for ADL's): Independent Proper positioning of operated UE when showering: Independent Dressing change: Independent Positioning of UE while sleeping: [place] expects to be discharged to::  Private residence Living Arrangements: Spouse/significant other Available Help at Discharge: Family;Available PRN/intermittently                                    Prior Functioning/Environment                          OT Problem List: Decreased strength;Decreased range of motion;Impaired UE functional use      OT Treatment/Interventions:      OT Goals(Current goals can be found in the care plan section) Acute Rehab OT Goals OT Goal Formulation: All assessment and education complete, DC therapy  OT Frequency:      Co-evaluation              AM-PAC OT "6 Clicks" Daily Activity     Outcome Measure Help from another person eating meals?: None Help from another person taking care of personal grooming?: None Help from another person toileting, which includes using toliet, bedpan, or urinal?: None Help from another person bathing (including washing, rinsing, drying)?: None Help from another person to put on and taking off regular upper body clothing?: None Help from another person to put on and taking off regular lower body clothing?: None 6 Click Score: 24   End of Session Nurse Communication:  (OT education complete)  Activity Tolerance: Patient tolerated treatment well Patient left: in chair                   Time: 3291-9166 OT Time Calculation (min): 19 min Charges:  OT General Charges $OT Visit: 1 Visit OT Evaluation $OT Eval Low Complexity: 1 Low  Jasreet Dickie, OTR/L [place]  Office [phone] Pager: [phone]   Lenward Chancellor 12/23/2021, 9:59 AM

## 2021-12-23 NOTE — Plan of Care (Signed)
Plan of care reviewed and discussed with the patient. 

## 2021-12-23 NOTE — TOC CM/SW Note (Signed)
  Transition of Care [facility]) Screening Note   Patient Details  Name: Maureen Randall Date of Birth: Sep 21, 1965   Transition of Care [facility]) CM/SW Contact:    Ross Ludwig, LCSW Phone Number: 12/23/2021, 11:00 AM    Transition of Care Department [facility]) has reviewed patient and no TOC needs have been identified at this time. We will continue to monitor patient advancement through interdisciplinary progression rounds. If new patient transition needs arise, please place a TOC consult.

## 2021-12-23 NOTE — Discharge Summary (Signed)
In most cases prophylactic antibiotics for Dental procdeures after total joint surgery are not necessary.  Exceptions are as follows:  1. History of prior total joint infection  2. Severely immunocompromised (Organ Transplant, cancer chemotherapy, Rheumatoid biologic meds such as [place])  3. Poorly controlled diabetes (A1C &gt; 8.0, blood glucose over 200)  If you have one of these conditions, contact your surgeon for an antibiotic prescription, prior to your dental procedure. Orthopedic Discharge Summary        Physician Discharge Summary  Patient ID: KESI PERROW MRN: [id number] DOB/AGE: 02-28-1966 56 y.o.  Admit date: 12/22/2021 Discharge date: 12/23/2021   Procedures:  Procedure(s) (LRB): TOTAL SHOULDER ARTHROPLASTY (Right)  Attending Physician:  Dr. 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 after surgery, while on BCP   GERD (gastroesophageal reflux disease)    Headache, post-myelogram 09/11/2021   Hyperlipidemia    Left leg DVT ([place])    fem/pop 12/07/20 after right shoulder surgery 11/30/20   Neuropathy    post surgery   Pneumonia    11/2021 seen at [facility]   PONV (postoperative nausea and vomiting)    PVD (peripheral vascular disease) ([place])    Spondylolisthesis     PCP: McLean-Scocuzza, Nino Glow, MD   Discharged Condition: good  Hospital Course:  Patient underwent the above stated procedure on 12/22/2021. Patient tolerated the  procedure well and brought to the recovery room in good condition and subsequently to the floor. Patient had an uncomplicated hospital course and was stable for discharge.   Disposition: Discharge disposition: 01-Home or Self Care      with follow up in 2 weeks    Follow-up Information     Netta Cedars, MD. Call in 2 week(s).   Specialty: Orthopedic Surgery Why: call [phone] for appt in two weeks in the office Contact information: [address] [place] [address] [phone]                 Dental Antibiotics:  In most cases prophylactic antibiotics for Dental procdeures after total joint surgery are not necessary.  Exceptions are as follows:  1. History of prior total joint infection  2. Severely immunocompromised (Organ Transplant, cancer chemotherapy, Rheumatoid biologic meds such as [place])  3. Poorly controlled diabetes (A1C &gt; 8.0, blood glucose over 200)  If you have one of these conditions, contact your surgeon for an antibiotic prescription, prior to your dental procedure.  Discharge Instructions     Call MD / Call 911   Complete by: As directed    If you experience chest pain or shortness of breath, CALL 911 and be transported to the hospital emergency room.  If you develope a fever above 101 F, pus (white drainage) or increased drainage or redness at the wound, or calf pain, call your surgeon's office.   Constipation Prevention   Complete by: As directed  Drink plenty of fluids.  Prune juice may be helpful.  You may use a stool softener, such as Colace (over the counter) 100 mg twice a day.  Use MiraLax (over the counter) for constipation as needed.   Diet - low sodium heart healthy   Complete by: As directed    Increase activity slowly as tolerated   Complete by: As directed    Post-operative opioid taper instructions:   Complete by: As directed    POST-OPERATIVE OPIOID TAPER INSTRUCTIONS: It is important to wean  off of your opioid medication as soon as possible. If you do not need pain medication after your surgery it is ok to stop day one. Opioids include: Codeine, Hydrocodone(Norco, Vicodin), Oxycodone(Percocet, oxycontin) and hydromorphone amongst others.  Long term and even short term use of opiods can cause: Increased pain response Dependence Constipation Depression Respiratory depression And more.  Withdrawal symptoms can include Flu like symptoms Nausea, vomiting And more Techniques to manage these symptoms Hydrate well Eat regular healthy meals Stay active Use relaxation techniques(deep breathing, meditating, yoga) Do Not substitute Alcohol to help with tapering If you have been on opioids for less than two weeks and do not have pain than it is ok to stop all together.  Plan to wean off of opioids This plan should start within one week post op of your joint replacement. Maintain the same interval or time between taking each dose and first decrease the dose.  Cut the total daily intake of opioids by one tablet each day Next start to increase the time between doses. The last dose that should be eliminated is the evening dose.          Allergies as of 12/23/2021       Reactions   Amoxicillin Hives   Crestor [rosuvastatin]    Myalgia   Septra [sulfamethoxazole-trimethoprim] Hives       Sulfamethoxazole Hives   Trimethoprim Hives   Zanaflex [tizanidine] Other (See Comments)   Weak muscles   Keflex [cephalexin] Rash           Medication List     TAKE these medications    acetaminophen 650 MG CR tablet Commonly known as: TYLENOL Take 1,300 mg by mouth every 8 (eight) hours as needed (pain/headaches.).   baclofen 10 MG tablet Commonly known as: LIORESAL Take 1 tablet (10 mg total) by mouth 3 (three) times daily as needed for muscle spasms. What changed: when to take this   Belbuca 450 MCG Film Generic drug: Buprenorphine HCl Place 450 mcg inside cheek in the  morning and at bedtime.   cetirizine 10 MG tablet Commonly known as: ZYRTEC Take 10 mg by mouth daily as needed for allergies.   dexlansoprazole 60 MG capsule Commonly known as: DEXILANT TAKE 1 CAPSULE BY MOUTH EVERY DAY What changed: when to take this   diclofenac Sodium 1 % Gel Commonly known as: VOLTAREN APPLY 2 GRAMS TO UPPER BODY FOUR TIMES DAILY AS NEEDED AND 4 GRAMS TO LOWER BODY FOUR TIMES DAILY AS NEEDED   DULoxetine 60 MG capsule Commonly known as: CYMBALTA TAKE 1 CAPSULE BY MOUTH EVERY DAY   estradiol 0.1 MG/GM vaginal cream Commonly known as: ESTRACE Place 1 Applicatorful vaginally every 14 (fourteen) days.   ezetimibe 10 MG tablet Commonly known as: ZETIA Take 1 tablet (10 mg total) by mouth daily. What changed: when to take this   famotidine 20 MG tablet Commonly known as: PEPCID Take 1 tablet (20 mg total) by mouth 2 (  two) times daily. TAKE 1 TABLET BY MOUTH EVERYDAY AT BEDTIME   fluticasone 50 MCG/ACT nasal spray Commonly known as: FLONASE Place 1-2 sprays into both nostrils daily as needed for allergies.   hydrOXYzine 10 MG tablet Commonly known as: ATARAX TAKE 1 TABLET BY MOUTH DAILY AS NEEDED FOR ANXIETY.   ondansetron 8 MG disintegrating tablet Commonly known as: ZOFRAN-ODT Take 1 tablet (8 mg total) by mouth every 8 (eight) hours as needed for nausea or vomiting.   oxyCODONE 5 MG immediate release tablet Commonly known as: Oxy IR/ROXICODONE Take 1 tablet (5 mg total) by mouth every 4 (four) hours as needed for severe pain or breakthrough pain.   oxyCODONE-acetaminophen 10-325 MG tablet Commonly known as: PERCOCET Take 1 tablet by mouth in the morning, at noon, in the evening, and at bedtime.   phentermine 37.5 MG tablet Commonly known as: ADIPEX-P TAKE 1 TABLET(37.5 MG) BY MOUTH DAILY BEFORE BREAKFAST   pregabalin 50 MG capsule Commonly known as: LYRICA Take 100 mg by mouth 2 (two) times daily.   psyllium 28 % packet Commonly known as:  METAMUCIL SMOOTH TEXTURE Take 1 packet by mouth every other day.   rivaroxaban 10 MG Tabs tablet Commonly known as: XARELTO Take 10 mg by mouth in the morning. (1000)   traZODone 100 MG tablet Commonly known as: DESYREL TAKE 2 TABLETS (200 MG TOTAL) BY MOUTH AT BEDTIME AS NEEDED FOR SLEEP. What changed:  how much to take when to take this   TURMERIC PO Take 2,000 mg by mouth in the morning. 1000 mg/capsule   valACYclovir 500 MG tablet Commonly known as: VALTREX Take 500 mg by mouth 2 (two) times daily as needed (outbreaks).          Signed: Augustin Schooling 12/23/2021, 9:34 AM  [facility] is now [facility] [address] [place], [place], [place] [address] Phone: [place]

## 2021-12-23 NOTE — Plan of Care (Signed)
  Problem: Education: Goal: Knowledge of General Education information will improve Description: Including pain rating scale, medication(s)/side effects and non-pharmacologic comfort measures Outcome: Progressing   Problem: Activity: Goal: Risk for activity intolerance will decrease Outcome: Progressing   Problem: Pain Managment: Goal: General experience of comfort will improve Outcome: Progressing   

## 2021-12-23 NOTE — Progress Notes (Signed)
Orthopedics Progress Note  Subjective: Patient comfortable this AM. Block still working.   Objective:  Vitals:   12/23/21 0216 12/23/21 0627  BP: 94/62 103/72  Pulse: 80 78  Resp: 16 16  Temp: (!) 97.5 F (36.4 C) 97.9 F (36.6 C)  SpO2: 94% 93%    General: Awake and alert  Musculoskeletal: Right shoulder dressing changed. Wound looks good. Minimal swelling Neurovascularly intact  Lab Results  Component Value Date   WBC 5.4 12/14/2021   HGB 13.1 12/14/2021   HCT 40.0 12/14/2021   MCV 97.3 12/14/2021   PLT 322 12/14/2021       Component Value Date/Time   NA 141 12/14/2021 1024   K 4.0 12/14/2021 1024   CL 109 12/14/2021 1024   CO2 26 12/14/2021 1024   GLUCOSE 102 (H) 12/14/2021 1024   BUN 21 (H) 12/14/2021 1024   CREATININE 0.68 12/14/2021 1024   CALCIUM 8.9 12/14/2021 1024   GFRNONAA >60 12/14/2021 1024    Lab Results  Component Value Date   INR 1.0 09/25/2019    Assessment/Plan: POD #1 s/p Procedure(s): TOTAL SHOULDER ARTHROPLASTY OT this morning and then discharge to home.  Follow up in two weeks in the office.   Doran Heater. [name], MD 12/23/2021 9:29 AM

## 2021-12-25 ENCOUNTER — Encounter (HOSPITAL_COMMUNITY): Payer: Self-pay | Admitting: Orthopedic Surgery

## 2021-12-26 ENCOUNTER — Encounter: Payer: Self-pay | Admitting: Psychiatry

## 2021-12-26 ENCOUNTER — Telehealth (INDEPENDENT_AMBULATORY_CARE_PROVIDER_SITE_OTHER): Payer: 59 | Admitting: Psychiatry

## 2021-12-26 DIAGNOSIS — G4701 Insomnia due to medical condition: Secondary | ICD-10-CM

## 2021-12-26 DIAGNOSIS — F411 Generalized anxiety disorder: Secondary | ICD-10-CM

## 2021-12-26 MED ORDER — DULOXETINE HCL 60 MG PO CPEP: ORAL_CAPSULE | ORAL | 1 refills | Status: DC

## 2021-12-26 NOTE — Progress Notes (Signed)
Virtual Visit via Video Note  I connected with Maureen Randall on 12/26/21 at  1:00 PM EDT by a video enabled telemedicine application and verified that I am speaking with the correct person using two identifiers.  Location Provider Location : [facility] Patient Location : Home  Participants: Patient , Provider   I discussed the limitations of evaluation and management by telemedicine and the availability of in person appointments. The patient expressed understanding and agreed to proceed.   I discussed the assessment and treatment plan with the patient. The patient was provided an opportunity to ask questions and all were answered. The patient agreed with the plan and demonstrated an understanding of the instructions.   The patient was advised to call back or seek an in-person evaluation if the symptoms worsen or if the condition fails to improve as anticipated.   [place] MD OP Progress Note  12/26/2021 1:49 PM Maureen Randall  MRN:  [id number]  Chief Complaint:  Chief Complaint  Patient presents with   Follow-up: 56 year old Caucasian female with history of GAD, insomnia, chronic pain was evaluated for medication management.   HPI: Maureen Randall is a 56 year old Caucasian female, married, lives in [place], on disability, has a history of GAD, insomnia, chronic pain, primary osteoarthritis, radiculopathy, degenerative disc disease, spondylolisthesis, bilateral carpal tunnel syndrome was evaluated by telemedicine today.  Patient today reports she had total shoulder replacement, right sided on Friday, June 16.  She is currently on oxycodone 5 mg for breakthrough pain.  She reports in spite of that since the past couple of days she has been having shooting pain whenever she tries to do her exercises.  Agrees to talk to her provider.She also struggles with pain of her back as well as her left-sided foot.  Currently following up with her providers for the same.   Patient does report sleep was  restless due to pain.  Recently started taking trazodone which has been helpful.  Otherwise denies any significant mood symptoms.  Reports depression and anxiety is currently under control on the Cymbalta.   Denies any suicidality, homicidality or perceptual disturbances.  Patient denies any other concerns today.  Visit Diagnosis:    ICD-10-CM   1. GAD (generalized anxiety disorder)  F41.1     2. Insomnia due to medical condition  G47.01 DULoxetine (CYMBALTA) 60 MG capsule   pain      Past Psychiatric History: Reviewed past psychiatric history from progress note on 04/30/2018.  Past Medical History:  Past Medical History:  Diagnosis Date   Anxiety    Arthritis    osteoarthritis-joints, feet, shoulder   Asthma    previously diagnosed after a bout of bronchitis/ no problems since   Bulging of cervical intervertebral disc without myelopathy    C6-C7   Bursitis    b/l hips   Chronic pain syndrome    COPD (chronic obstructive pulmonary disease) ([place]) 2004   patient states probably bronchitis   Depression    DVT (deep venous thrombosis) ([place]) 2012   right leg. after surgery, while on BCP   GERD (gastroesophageal reflux disease)    Headache, post-myelogram 09/11/2021   Hyperlipidemia    Left leg DVT ([place])    fem/pop 12/07/20 after right shoulder surgery 11/30/20   Neuropathy    post surgery   Pneumonia    11/2021 [facility] at [facility]   PONV (postoperative nausea and vomiting)    PVD (peripheral vascular disease) ([place])    Spondylolisthesis     Past  Surgical History:  Procedure Laterality Date   ABLATION     veins in lower legs   BACK SURGERY  07/2014   lumbar spine/ fusion 1 level   BREAST BIOPSY Left 2011   neg   CARPAL TUNNEL RELEASE Right 12/2015   [facility]   CARPAL TUNNEL RELEASE Left 12/2016   COLONOSCOPY WITH PROPOFOL N/A 02/29/2020   Procedure: COLONOSCOPY WITH PROPOFOL;  Surgeon: [name], MD;  Location: [facility];  Service: Endoscopy;  Laterality:  N/A;   ESOPHAGOGASTRODUODENOSCOPY (EGD) WITH PROPOFOL N/A 02/29/2020   Procedure: ESOPHAGOGASTRODUODENOSCOPY (EGD) WITH PROPOFOL;  Surgeon: [name], MD;  Location: [place];  Service: Endoscopy;  Laterality: N/A;   FOOT SURGERY Right 2015   broken navicular bone   HIP ARTHROSCOPY Right 05/2011   HIP SURGERY     left hip surgery 05/2018   JOINT REPLACEMENT Bilateral 12/2017 & 05/2018   hip   L5-S1 OLIF Dr. Rennis Harding back surgery 09/2019  09/28/2019   left ganglion cyst removal     12/23/19 Dr. Elvina Mattes   MASS EXCISION Left 12/17/2019   Procedure: EXCISION TUMOR FOOT DEEP LEFT;  Surgeon: Albertine Patricia, DPM;  Location: [facility];  Service: Podiatry;  Laterality: Left;  LMA W/LOCAL   NASAL TURBINATE REDUCTION Bilateral 06/12/2019   Procedure: TURBINATE REDUCTION/SUBMUCOSAL RESECTION;  Surgeon: [name], MD;  Location: [facility];  Service: ENT;  Laterality: Bilateral;   REPLACEMENT TOTAL HIP W/  RESURFACING IMPLANTS Right 12/2017   [facility]   right shoulder surgery     11/28/20 for bone spur, torn labrum, re attached biceps tendon, tear Dr. [name]   right shoulder surgery     12/22/21 total shoulder replacement [facility] Dr. Malva Cogan CUFF REPAIR     Dr. [name] in 04/2019    SCLEROTHERAPY     veins in lower legs   SEPTOPLASTY Bilateral 06/12/2019   Procedure: SEPTOPLASTY;  Surgeon: [name], MD;  Location: [facility];  Service: ENT;  Laterality: Bilateral;   SHOULDER SURGERY Right 12/2015   [facility] torn rotator cuff   SPINAL FUSION  2016   [facility] lumbar   TOTAL SHOULDER ARTHROPLASTY Right 12/22/2021   Procedure: TOTAL SHOULDER ARTHROPLASTY;  Surgeon: Netta Cedars, MD;  Location: [facility];  Service: Orthopedics;  Laterality: Right;  with ISB    Family Psychiatric History: Reviewed family psychiatric history from progress note on 04/30/2018.  Family History:  Family History  Problem Relation  Age of Onset   Breast cancer Maternal Aunt    Breast cancer Paternal Aunt    Dementia Paternal Aunt    COPD Mother        quit smoking after 62 years   Arthritis Mother    Osteoarthritis Mother    Dementia Father        symptoms started in his 54s   Alzheimer's disease Father    Rheum arthritis Father    Rheum arthritis Other        FH   Dementia Other        "all my dad's side of the family"   Other Maternal Grandmother        got dementia as she got older    Dementia Paternal Grandmother     Social History: Reviewed social history from progress note on 04/30/2018. Social History   Socioeconomic History   Marital status: Married    Spouse name: fredrick   Number of children: 1  Years of education: Not on file   Highest education level: High school graduate  Occupational History    Comment: full time  Tobacco Use   Smoking status: Never   Smokeless tobacco: Never  Vaping Use   Vaping Use: Never used  Substance and Sexual Activity   Alcohol use: Not Currently    Alcohol/week: 1.0 standard drink of alcohol    Types: 1 Cans of beer per week    Comment: may have drink 1x/month   Drug use: Never   Sexual activity: Yes  Other Topics Concern   Not on file  Social History Narrative   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    Married 1 child    Lives at home with her husband   Right handed   Caffeine: maybe 1 cup/day (pepsi)   Social Determinants of Health   Financial Resource Strain: Medium Risk (04/30/2018)   Overall Financial Resource Strain (CARDIA)    Difficulty of Paying Living Expenses: Somewhat hard  Food Insecurity: Food Insecurity Present (04/30/2018)   Hunger Vital Sign    Worried About Running Out of Food in the Last Year: Sometimes true    Ran Out of Food in the Last Year: Sometimes true  Transportation Needs: No Transportation Needs (04/30/2018)   PRAPARE - Hydrologist (Medical): No    Lack of Transportation  (Non-Medical): No  Physical Activity: Inactive (04/30/2018)   Exercise Vital Sign    Days of Exercise per Week: 0 days    Minutes of Exercise per Session: 0 min  Stress: Stress Concern Present (04/30/2018)   [place]    Feeling of Stress : Very much  Social Connections: Unknown (04/30/2018)   Social Connection and Isolation Panel [NHANES]    Frequency of Communication with Friends and Family: Not on file    Frequency of Social Gatherings with Friends and Family: Not on file    Attends Religious Services: Never    Active Member of Clubs or Organizations: No    Attends Archivist Meetings: Never    Marital Status: Married    Allergies:  Allergies  Allergen Reactions   Amoxicillin Hives   Crestor [Rosuvastatin]     Myalgia    Septra [Sulfamethoxazole-Trimethoprim] Hives        Sulfamethoxazole Hives   Trimethoprim Hives   Zanaflex [Tizanidine] Other (See Comments)    Weak muscles     Keflex [Cephalexin] Rash         Metabolic Disorder Labs: Lab Results  Component Value Date   HGBA1C 5.5 12/14/2021   MPG 111.15 12/14/2021   No results found for: "PROLACTIN" Lab Results  Component Value Date   CHOL 235 (H) 10/03/2021   TRIG 116 10/03/2021   HDL 65 10/03/2021   CHOLHDL 3.6 10/03/2021   VLDL 22.6 01/23/2021   LDLCALC 150 (H) 10/03/2021   LDLCALC 135 (H) 01/23/2021   Lab Results  Component Value Date   TSH 1.49 09/22/2020   TSH 3.78 01/05/2019    Therapeutic Level Labs: No results found for: "LITHIUM" No results found for: "VALPROATE" No results found for: "CBMZ"  Current Medications: Current Outpatient Medications  Medication Sig Dispense Refill   acetaminophen (TYLENOL) 650 MG CR tablet Take 1,300 mg by mouth every 8 (eight) hours as needed (pain/headaches.).     baclofen (LIORESAL) 10 MG tablet Take 1 tablet (10 mg total) by mouth 3 (three) times daily as needed  for muscle  spasms. 60 each 1   Buprenorphine HCl (BELBUCA) 450 MCG FILM Place 450 mcg inside cheek in the morning and at bedtime.     cetirizine (ZYRTEC) 10 MG tablet Take 10 mg by mouth daily as needed for allergies.     dexlansoprazole (DEXILANT) 60 MG capsule TAKE 1 CAPSULE BY MOUTH EVERY DAY (Patient taking differently: Take 60 mg by mouth at bedtime.) 90 capsule 1   diclofenac Sodium (VOLTAREN) 1 % GEL APPLY 2 GRAMS TO UPPER BODY FOUR TIMES DAILY AS NEEDED AND 4 GRAMS TO LOWER BODY FOUR TIMES DAILY AS NEEDED 100 g 11   estradiol (ESTRACE) 0.1 MG/GM vaginal cream Place 1 Applicatorful vaginally every 14 (fourteen) days.     ezetimibe (ZETIA) 10 MG tablet Take 1 tablet (10 mg total) by mouth daily. (Patient taking differently: Take 10 mg by mouth at bedtime.) 30 tablet 3   famotidine (PEPCID) 20 MG tablet Take 1 tablet (20 mg total) by mouth 2 (two) times daily. TAKE 1 TABLET BY MOUTH EVERYDAY AT BEDTIME 180 tablet 3   fluticasone (FLONASE) 50 MCG/ACT nasal spray Place 1-2 sprays into both nostrils daily as needed for allergies.     hydrOXYzine (ATARAX) 10 MG tablet TAKE 1 TABLET BY MOUTH DAILY AS NEEDED FOR ANXIETY. 90 tablet 1   ondansetron (ZOFRAN-ODT) 8 MG disintegrating tablet Take 1 tablet (8 mg total) by mouth every 8 (eight) hours as needed for nausea or vomiting. 30 tablet 2   oxyCODONE (OXY IR/ROXICODONE) 5 MG immediate release tablet Take 1 tablet (5 mg total) by mouth every 4 (four) hours as needed for severe pain or breakthrough pain. 30 tablet 0   pregabalin (LYRICA) 50 MG capsule Take 100 mg by mouth 2 (two) times daily.     psyllium (METAMUCIL SMOOTH TEXTURE) 28 % packet Take 1 packet by mouth every other day.     rivaroxaban (XARELTO) 10 MG TABS tablet Take 10 mg by mouth in the morning. (1000)     traZODone (DESYREL) 100 MG tablet TAKE 2 TABLETS (200 MG TOTAL) BY MOUTH AT BEDTIME AS NEEDED FOR SLEEP. (Patient taking differently: Take 150 mg by mouth at bedtime.) 180 tablet 1   valACYclovir  (VALTREX) 500 MG tablet Take 500 mg by mouth 2 (two) times daily as needed (outbreaks).     DULoxetine (CYMBALTA) 60 MG capsule TAKE 1 CAPSULE BY MOUTH EVERY DAY 90 capsule 1   HYDROcodone-acetaminophen (NORCO) 7.5-325 MG tablet Norco 7.5 mg-325 mg tablet  Take 1 tablet every 6 hours by oral route. (Patient not taking: Reported on 12/26/2021)     oxyCODONE-acetaminophen (PERCOCET) 10-325 MG tablet Take 1 tablet by mouth in the morning, at noon, in the evening, and at bedtime. (Patient not taking: Reported on 12/26/2021)     phentermine (ADIPEX-P) 37.5 MG tablet TAKE 1 TABLET(37.5 MG) BY MOUTH DAILY BEFORE BREAKFAST (Patient not taking: Reported on 12/26/2021) 60 tablet 0   TURMERIC PO Take 2,000 mg by mouth in the morning. 1000 mg/capsule (Patient not taking: Reported on 12/26/2021)     No current facility-administered medications for this visit.     Musculoskeletal: Strength & Muscle Tone:  UTA Gait & Station:  Seated Patient leans: N/A  Psychiatric Specialty Exam: Review of Systems  Musculoskeletal:  Positive for back pain.       Rt.shoulder pain , s/p surgery , left foot pain  Psychiatric/Behavioral:  Positive for sleep disturbance.   All other systems reviewed and are negative.   There  were no vitals taken for this visit.There is no height or weight on file to calculate BMI.  General Appearance: Casual  Eye Contact:  Fair  Speech:  Clear and Coherent  Volume:  Normal  Mood:  Euthymic  Affect:  Congruent  Thought Process:  Goal Directed and Descriptions of Associations: Intact  Orientation:  Full (Time, Place, and Person)  Thought Content: Logical   Suicidal Thoughts:  No  Homicidal Thoughts:  No  Memory:  Immediate;   Fair Recent;   Fair Remote;   Fair  Judgement:  Fair  Insight:  Fair  Psychomotor Activity:  Normal  Concentration:  Concentration: Fair and Attention Span: Fair  Recall:  [employer] of Knowledge: Fair  Language: Fair  Akathisia:  No  Handed:  Right  AIMS  (if indicated): done  Assets:  Communication Skills Desire for Improvement Housing Intimacy Social Support Transportation  ADL's:  Intact  Cognition: WNL  Sleep:  Poor due to pain   Screenings: AIMS    Flowsheet Row Video Visit from 12/26/2021 in [facility] [place] Total Score 0      GAD-7    Flowsheet Row Video Visit from 12/26/2021 in [place] Office Visit from 09/11/2021 in [place] Video Visit from 03/01/2021 in [place] Video Visit from 10/19/2020 in [place] Video Visit from 09/21/2020 in [place]  Total GAD-7 Score '1 3 3 2 4      '$ [place] Visit from 09/23/2019 in [facility]  Total Score (max 30 points ) 27      PHQ2-9    Flowsheet Row Video Visit from 12/26/2021 in [place] from 12/15/2021 in [place] Office Visit from 09/11/2021 in [place] Video Visit from 05/16/2021 in [place] Video Visit from 03/01/2021 in [place] [facility] [place]  PHQ-2 Total Score 0 1 0 0 0      Flowsheet Row Video Visit from 12/26/2021 in [place] Admission (Discharged) from 12/22/2021 in [facility] [facility] from 12/14/2021 in [place] No Risk No Risk No Risk        Assessment and Plan: Maureen Randall is a 56 year old Caucasian female, married, on SSD, lives in [place], has a history of GAD, insomnia, chronic pain, osteoarthritis, degenerative disc disease, multiple surgeries with recent right shoulder total replacement, presented for medication management.  Patient with sleep problems due to pain  otherwise stable on Cymbalta.  Discussed plan as noted below.  Plan  GAD-stable Cymbalta 60 mg p.o. daily Hydroxyzine 10 mg p.o. daily as needed for severe anxiety  Insomnia-unstable Patient will need sufficient pain management.  She will reach out to her provider. Trazodone 200 mg p.o. nightly as needed-encouraged compliance. Melatonin 3 mg p.o. nightly  Follow-up in clinic in 3 months or sooner if needed.  Collaboration of Care: Collaboration of Care: Other-encouraged to follow up with her provider for management of pain.  Patient/Guardian was advised Release of Information must be obtained prior to any record release in order to collaborate their care with an outside provider. Patient/Guardian was advised if they have not already done so to contact the registration department to sign all necessary forms in order for [place] to release information regarding their care.   Consent: Patient/Guardian gives verbal consent  for treatment and assignment of benefits for services provided during this visit. Patient/Guardian expressed understanding and agreed to proceed.   This note was generated in part or whole with voice recognition software. Voice recognition is usually quite accurate but there are transcription errors that can and very often do occur. I apologize for any typographical errors that were not detected and corrected.      Ursula Alert, MD 12/26/2021, 1:49 PM

## 2022-01-03 ENCOUNTER — Ambulatory Visit: Payer: 59 | Admitting: Internal Medicine

## 2022-01-19 ENCOUNTER — Telehealth: Payer: Self-pay

## 2022-01-19 NOTE — Telephone Encounter (Signed)
Patient states she has been experiencing swelling in both legs, both knees, and her left hand on and off for the last two weeks.  Patient states her knees are hurting and that is unusual.  Patient's call was transferred to Access Nurse.

## 2022-01-19 NOTE — Telephone Encounter (Signed)
Also rec [facility] ED or [facility] if she wants to be seen today

## 2022-01-19 NOTE — Telephone Encounter (Signed)
Pt transferred to access nurse due to bilateral knee & leg edema as well as in her left hand. Pt states this has been ongoing for 2 wks & episodes come & go. Pain mainly in the morning but subsides throughout the day. Per access nurse she needed to be seen in the next 3/4 hrs but no available appts here in ofc. Access nurse advised for pt to reach out to cardio or vascular, pt agreed with plan & would reach out to them.

## 2022-01-26 ENCOUNTER — Ambulatory Visit
Admission: RE | Admit: 2022-01-26 | Discharge: 2022-01-26 | Disposition: A | Discharge status: Home / Self Care | Payer: 59 | Source: Ambulatory Visit | Attending: Internal Medicine | Admitting: Internal Medicine

## 2022-01-26 ENCOUNTER — Telehealth: Payer: Self-pay | Admitting: Internal Medicine

## 2022-01-26 ENCOUNTER — Ambulatory Visit
Admission: RE | Admit: 2022-01-26 | Discharge: 2022-01-26 | Disposition: A | Discharge status: Home / Self Care | Payer: 59 | Attending: Internal Medicine | Admitting: Internal Medicine

## 2022-01-26 DIAGNOSIS — J189 Pneumonia, unspecified organism: Secondary | ICD-10-CM | POA: Insufficient documentation

## 2022-01-26 NOTE — Telephone Encounter (Signed)
Pt called stating the provider told her make an appointment for an pneumonia shot and the provider also told her to make another appointment for a chest xray

## 2022-02-02 ENCOUNTER — Other Ambulatory Visit: Payer: Self-pay | Admitting: Internal Medicine

## 2022-02-13 ENCOUNTER — Ambulatory Visit (INDEPENDENT_AMBULATORY_CARE_PROVIDER_SITE_OTHER): Payer: 59

## 2022-02-13 DIAGNOSIS — Z23 Encounter for immunization: Secondary | ICD-10-CM

## 2022-02-13 NOTE — Progress Notes (Signed)
Pt arrived for Prevnar 20 vaccine, given in L deltoid. Pt tolerated injection well, showed no signs of distress nor voiced any concerns.

## 2022-02-21 ENCOUNTER — Ambulatory Visit: Payer: 59 | Admitting: Internal Medicine

## 2022-02-26 ENCOUNTER — Ambulatory Visit: Payer: 59 | Admitting: General Practice

## 2022-02-27 ENCOUNTER — Other Ambulatory Visit: Payer: Self-pay | Admitting: Gastroenterology

## 2022-03-03 ENCOUNTER — Other Ambulatory Visit: Payer: Self-pay | Admitting: Internal Medicine

## 2022-03-29 ENCOUNTER — Encounter: Payer: Self-pay | Admitting: Psychiatry

## 2022-03-29 ENCOUNTER — Telehealth (INDEPENDENT_AMBULATORY_CARE_PROVIDER_SITE_OTHER): Payer: 59 | Admitting: Psychiatry

## 2022-03-29 DIAGNOSIS — Z7901 Long term (current) use of anticoagulants: Secondary | ICD-10-CM | POA: Insufficient documentation

## 2022-03-29 DIAGNOSIS — G4701 Insomnia due to medical condition: Secondary | ICD-10-CM | POA: Diagnosis not present

## 2022-03-29 DIAGNOSIS — F411 Generalized anxiety disorder: Secondary | ICD-10-CM | POA: Diagnosis not present

## 2022-03-29 DIAGNOSIS — M5412 Radiculopathy, cervical region: Secondary | ICD-10-CM | POA: Insufficient documentation

## 2022-03-29 MED ORDER — BUSPIRONE HCL 10 MG PO TABS: 10.0000 mg | ORAL_TABLET | Freq: Three times a day (TID) | ORAL | 0 refills | Status: DC

## 2022-03-29 NOTE — Progress Notes (Signed)
Virtual Visit via Video Note  I connected with Maureen Randall on 03/29/22 at  4:00 PM EDT by a video enabled telemedicine application and verified that I am speaking with the correct person using two identifiers.  Location Provider Location : [facility] Patient Location : Home  Participants: Patient , Provider    I discussed the limitations of evaluation and management by telemedicine and the availability of in person appointments. The patient expressed understanding and agreed to proceed.    I discussed the assessment and treatment plan with the patient. The patient was provided an opportunity to ask questions and all were answered. The patient agreed with the plan and demonstrated an understanding of the instructions.   The patient was advised to call back or seek an in-person evaluation if the symptoms worsen or if the condition fails to improve as anticipated.  [facility] MD OP Progress Note  03/30/2022 8:27 AM JAMILYA SARRAZIN  MRN:  [id number]  Chief Complaint:  Chief Complaint  Patient presents with   Follow-up: 56 year old Caucasian female with history of anxiety, insomnia, chronic pain presented for medication management with worsening anxiety symptoms.   HPI: Maureen Randall is a 56 year old Caucasian female, married, lives in [place], on disability, has a history of GAD, insomnia, chronic pain, primary osteoarthritis, radiculopathy, degenerative disc disease, spondylolisthesis, bilateral carpal tunnel syndrome was evaluated by telemedicine today.  Patient today reports she continues to recover from her right sided total shoulder arthroplasty which was done in June.  She reports she is currently in physical therapy.  Reports the pain does have an impact on her sleep.  It is hard for her to lay on certain side because of the pain.  That does affect her sleep.  Does take the trazodone 200 mg most nights to help with sleep.  Reports she does have anxiety about current situational stressors.   Does feel overwhelmed and on edge at times.  She reports she is also trying to support her mother who is 38 years old and lives nearby.  That can be anxiety provoking.  Denies any depressive symptoms.  Denies any suicidality, homicidality or perceptual disturbances.  Currently compliant on medications like Cymbalta, denies side effects.  Does have hydroxyzine available which she uses as needed.  Denies any other concerns today.  Visit Diagnosis:    ICD-10-CM   1. GAD (generalized anxiety disorder)  F41.1 busPIRone (BUSPAR) 10 MG tablet    2. Insomnia due to medical condition  G47.01    pain      Past Psychiatric History: Reviewed past psychiatric history from progress note on 04/30/2018.  Past Medical History:  Past Medical History:  Diagnosis Date   Anxiety    Arthritis    osteoarthritis-joints, feet, shoulder   Asthma    previously diagnosed after a bout of bronchitis/ no problems since   Bulging of cervical intervertebral disc without myelopathy    C6-C7   Bursitis    b/l hips   Chronic pain syndrome    COPD (chronic obstructive pulmonary disease) ([facility]) 2004   patient states probably bronchitis   Depression    DVT (deep venous thrombosis) ([facility]) 2012   right leg. after surgery, while on BCP   GERD (gastroesophageal reflux disease)    Headache, post-myelogram 09/11/2021   Hyperlipidemia    Left leg DVT ([place])    fem/pop 12/07/20 after right shoulder surgery 11/30/20   Neuropathy    post surgery   Pneumonia    11/2021 [facility] at [facility]  PONV (postoperative nausea and vomiting)    PVD (peripheral vascular disease) (H[facility])    Spondylolisthesis     Past Surgical History:  Procedure Laterality Date   ABLATION     veins in lower legs   BACK SURGERY  07/2014   lumbar spine/ fusion 1 level   BREAST BIOPSY Left 2011   neg   CARPAL TUNNEL RELEASE Right 12/2015   [facility]   CARPAL TUNNEL RELEASE Left 12/2016   COLONOSCOPY WITH PROPOFOL N/A 02/29/2020   Procedure:  COLONOSCOPY WITH PROPOFOL;  Surgeon: [name], MD;  Location: [facility];  Service: Endoscopy;  Laterality: N/A;   ESOPHAGOGASTRODUODENOSCOPY (EGD) WITH PROPOFOL N/A 02/29/2020   Procedure: ESOPHAGOGASTRODUODENOSCOPY (EGD) WITH PROPOFOL;  Surgeon: [name], MD;  Location: [facility];  Service: Endoscopy;  Laterality: N/A;   FOOT SURGERY Right 2015   broken navicular bone   HIP ARTHROSCOPY Right 05/2011   HIP SURGERY     left hip surgery 05/2018   JOINT REPLACEMENT Bilateral 12/2017 & 05/2018   hip   L5-S1 OLIF Dr. Rennis Harding back surgery 09/2019  09/28/2019   left ganglion cyst removal     12/23/19 Dr. Elvina Mattes   MASS EXCISION Left 12/17/2019   Procedure: EXCISION TUMOR FOOT DEEP LEFT;  Surgeon: Albertine Patricia, DPM;  Location: [facility];  Service: Podiatry;  Laterality: Left;  LMA W/LOCAL   NASAL TURBINATE REDUCTION Bilateral 06/12/2019   Procedure: TURBINATE REDUCTION/SUBMUCOSAL RESECTION;  Surgeon: [name], MD;  Location: [facility];  Service: ENT;  Laterality: Bilateral;   REPLACEMENT TOTAL HIP W/  RESURFACING IMPLANTS Right 12/2017   [facility]   right shoulder surgery     11/28/20 for bone spur, torn labrum, re attached biceps tendon, tear Dr. [name]   right shoulder surgery     12/22/21 total shoulder replacement [facility] Dr. Malva Cogan CUFF REPAIR     Dr. [name] in 04/2019    SCLEROTHERAPY     veins in lower legs   SEPTOPLASTY Bilateral 06/12/2019   Procedure: SEPTOPLASTY;  Surgeon: [name], MD;  Location: [facility];  Service: ENT;  Laterality: Bilateral;   SHOULDER SURGERY Right 12/2015   [facility] torn rotator cuff   SPINAL FUSION  2016   [facility] lumbar   TOTAL SHOULDER ARTHROPLASTY Right 12/22/2021   Procedure: TOTAL SHOULDER ARTHROPLASTY;  Surgeon: Netta Cedars, MD;  Location: [facility];  Service: Orthopedics;  Laterality: Right;  with ISB    Family Psychiatric History:  Reviewed family psychiatric history from progress note on 04/30/2018.  Family History:  Family History  Problem Relation Age of Onset   Breast cancer Maternal Aunt    Breast cancer Paternal Aunt    Dementia Paternal Aunt    COPD Mother        quit smoking after 53 years   Arthritis Mother    Osteoarthritis Mother    Dementia Father        symptoms started in his 40s   Alzheimer's disease Father    Rheum arthritis Father    Rheum arthritis Other        FH   Dementia Other        "all my dad's side of the family"   Other Maternal Grandmother        got dementia as she got older    Dementia Paternal Grandmother     Social History: Reviewed social history from progress note on 04/30/2018. Social History  Socioeconomic History   Marital status: Married    Spouse name: fredrick   Number of children: 1   Years of education: Not on file   Highest education level: High school graduate  Occupational History    Comment: full time  Tobacco Use   Smoking status: Never   Smokeless tobacco: Never  Vaping Use   Vaping Use: Never used  Substance and Sexual Activity   Alcohol use: Not Currently    Alcohol/week: 1.0 standard drink of alcohol    Types: 1 Cans of beer per week    Comment: may have drink 1x/month   Drug use: Never   Sexual activity: Yes  Other Topics Concern   Not on file  Social History Narrative   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    Married 1 child    Lives at home with her husband   Right handed   Caffeine: maybe 1 cup/day (pepsi)   Social Determinants of Health   Financial Resource Strain: Medium Risk (04/30/2018)   Overall Financial Resource Strain (CARDIA)    Difficulty of Paying Living Expenses: Somewhat hard  Food Insecurity: Food Insecurity Present (04/30/2018)   Hunger Vital Sign    Worried About [place] in the Last Year: Sometimes true    Ran Out of Food in the Last Year: Sometimes true  Transportation Needs: No  Transportation Needs (04/30/2018)   PRAPARE - Hydrologist (Medical): No    Lack of Transportation (Non-Medical): No  Physical Activity: Inactive (04/30/2018)   Exercise Vital Sign    Days of Exercise per Week: 0 days    Minutes of Exercise per Session: 0 min  Stress: Stress Concern Present (04/30/2018)   [place]    Feeling of Stress : Very much  Social Connections: Unknown (04/30/2018)   Social Connection and Isolation Panel [NHANES]    Frequency of Communication with Friends and Family: Not on file    Frequency of Social Gatherings with Friends and Family: Not on file    Attends Religious Services: Never    Active Member of Clubs or Organizations: No    Attends Archivist Meetings: Never    Marital Status: Married    Allergies:  Allergies  Allergen Reactions   Amoxicillin Hives   Crestor [Rosuvastatin]     Myalgia    Septra [Sulfamethoxazole-Trimethoprim] Hives        Sulfamethoxazole Hives   Trimethoprim Hives   Zanaflex [Tizanidine] Other (See Comments)    Weak muscles     Keflex [Cephalexin] Rash         Metabolic Disorder Labs: Lab Results  Component Value Date   HGBA1C 5.5 12/14/2021   MPG 111.15 12/14/2021   No results found for: "PROLACTIN" Lab Results  Component Value Date   CHOL 235 (H) 10/03/2021   TRIG 116 10/03/2021   HDL 65 10/03/2021   CHOLHDL 3.6 10/03/2021   VLDL 22.6 01/23/2021   LDLCALC 150 (H) 10/03/2021   LDLCALC 135 (H) 01/23/2021   Lab Results  Component Value Date   TSH 1.49 09/22/2020   TSH 3.78 01/05/2019    Therapeutic Level Labs: No results found for: "LITHIUM" No results found for: "VALPROATE" No results found for: "CBMZ"  Current Medications: Current Outpatient Medications  Medication Sig Dispense Refill   acetaminophen (TYLENOL) 650 MG CR tablet Take 1,300 mg by mouth every 8 (eight) hours as needed  (pain/headaches.).  baclofen (LIORESAL) 10 MG tablet Take 1 tablet (10 mg total) by mouth 3 (three) times daily as needed for muscle spasms. 60 each 1   Buprenorphine HCl (BELBUCA) 450 MCG FILM Place 450 mcg inside cheek in the morning and at bedtime.     busPIRone (BUSPAR) 10 MG tablet Take 1 tablet (10 mg total) by mouth 3 (three) times daily. 270 tablet 0   cetirizine (ZYRTEC) 10 MG tablet Take 10 mg by mouth daily as needed for allergies.     dexlansoprazole (DEXILANT) 60 MG capsule TAKE 1 CAPSULE BY MOUTH EVERY DAY 90 capsule 0   diclofenac Sodium (VOLTAREN) 1 % GEL APPLY 2 GRAMS TO UPPER BODY FOUR TIMES DAILY AS NEEDED AND 4 GRAMS TO LOWER BODY FOUR TIMES DAILY AS NEEDED 100 g 11   DULoxetine (CYMBALTA) 60 MG capsule TAKE 1 CAPSULE BY MOUTH EVERY DAY 90 capsule 1   estradiol (ESTRACE) 0.1 MG/GM vaginal cream Place 1 Applicatorful vaginally every 14 (fourteen) days.     ezetimibe (ZETIA) 10 MG tablet TAKE 1 TABLET BY MOUTH EVERY DAY 90 tablet 1   famotidine (PEPCID) 20 MG tablet Take 1 tablet (20 mg total) by mouth 2 (two) times daily. TAKE 1 TABLET BY MOUTH EVERYDAY AT BEDTIME 180 tablet 3   fluticasone (FLONASE) 50 MCG/ACT nasal spray SHAKE LIQUID AND USE 1 TO 2 SPRAYS IN EACH NOSTRIL DAILY AS NEEDED. MAX DOSE 2 SPRAYS IN EACH NOSTRIL 16 g 3   hydrOXYzine (ATARAX) 10 MG tablet TAKE 1 TABLET BY MOUTH DAILY AS NEEDED FOR ANXIETY. 90 tablet 1   methocarbamol (ROBAXIN) 500 MG tablet Take by mouth.     naloxone (NARCAN) nasal spray 4 mg/0.1 mL SMARTSIG:Both Nares     ondansetron (ZOFRAN-ODT) 8 MG disintegrating tablet Take 1 tablet (8 mg total) by mouth every 8 (eight) hours as needed for nausea or vomiting. 30 tablet 2   oxyCODONE-acetaminophen (PERCOCET) 10-325 MG tablet Take 1 tablet by mouth in the morning, at noon, in the evening, and at bedtime.     pregabalin (LYRICA) 50 MG capsule Take 100 mg by mouth 2 (two) times daily.     psyllium (METAMUCIL SMOOTH TEXTURE) 28 % packet Take 1  packet by mouth every other day.     rivaroxaban (XARELTO) 10 MG TABS tablet Take 10 mg by mouth in the morning. (1000)     traZODone (DESYREL) 100 MG tablet TAKE 2 TABLETS (200 MG TOTAL) BY MOUTH AT BEDTIME AS NEEDED FOR SLEEP. (Patient taking differently: Take 150 mg by mouth at bedtime.) 180 tablet 1   TURMERIC PO Take 2,000 mg by mouth in the morning. 1000 mg/capsule     valACYclovir (VALTREX) 500 MG tablet Take 500 mg by mouth 2 (two) times daily as needed (outbreaks).     phentermine (ADIPEX-P) 37.5 MG tablet TAKE 1 TABLET(37.5 MG) BY MOUTH DAILY BEFORE BREAKFAST (Patient not taking: Reported on 03/29/2022) 60 tablet 0   No current facility-administered medications for this visit.     Musculoskeletal: Strength & Muscle Tone:  UTA Gait & Station:  Seated Patient leans: N/A  Psychiatric Specialty Exam: Review of Systems  Musculoskeletal:        Rt.shoulder joint pain - s/p surgery , left foot pain   Psychiatric/Behavioral:  Positive for sleep disturbance. The patient is nervous/anxious.   All other systems reviewed and are negative.   There were no vitals taken for this visit.There is no height or weight on file to calculate BMI.  General  Appearance: Casual  Eye Contact:  Fair  Speech:  Clear and Coherent  Volume:  Normal  Mood:  Anxious  Affect:  Congruent  Thought Process:  Goal Directed and Descriptions of Associations: Intact  Orientation:  Full (Time, Place, and Person)  Thought Content: Logical   Suicidal Thoughts:  No  Homicidal Thoughts:  No  Memory:  Immediate;   Fair Recent;   Fair Remote;   Fair  Judgement:  Fair  Insight:  Fair  Psychomotor Activity:  Normal  Concentration:  Concentration: Fair and Attention Span: Fair  Recall:  [employer] of Knowledge: Fair  Language: Fair  Akathisia:  No  Handed:  Right  AIMS (if indicated): not done  Assets:  Communication Skills Desire for Improvement Housing Intimacy Social Support Transportation  ADL's:   Intact  Cognition: WNL  Sleep:   restless   Screenings: AIMS    Flowsheet Row Video Visit from 12/26/2021 in [facility] Total Score 0      GAD-7    Flowsheet Row Video Visit from 03/29/2022 in [facility] Video Visit from 12/26/2021 in [facility] Office Visit from 09/11/2021 in [facility] Video Visit from 03/01/2021 in [facility] Video Visit from 10/19/2020 in [facility]  Total GAD-7 Score '2 1 3 3 2      '$ [place] Visit from 09/23/2019 in [facility]  Total Score (max 30 points ) 27      PHQ2-9    Flowsheet Row Video Visit from 03/29/2022 in [facility] Video Visit from 12/26/2021 in [facility] Office Visit from 12/15/2021 in [facility] Office Visit from 09/11/2021 in [facility] Video Visit from 05/16/2021 in [facility]  PHQ-2 Total Score 0 0 1 0 0      Flowsheet Row Video Visit from 03/29/2022 in [facility] Video Visit from 12/26/2021 in [facility] Admission (Discharged) from 12/22/2021 in [facility] No Risk No Risk No Risk        Assessment and Plan: GWYN HIERONYMUS is a 56 year old Caucasian female, married, on SSD, lives in [place], has a history of GAD, insomnia, chronic pain, osteoarthritis, degenerative disc disease, multiple surgeries with recent right shoulder total replacement, presented for medication management with worsening anxiety.  Patient will benefit from the following plan.  Plan GAD-unstable Cymbalta 60 mg p.o. daily BuSpar 10 mg p.o. 3 times daily. Hydroxyzine 10 mg p.o. daily as needed for severe  anxiety  Insomnia-restless due to pain She will need sufficient pain management Trazodone 200 mg p.o. nightly as needed Melatonin 3 mg p.o. nightly  Follow-up in clinic in 2 months or sooner if needed.  Collaboration of Care: Collaboration of Care: Other encouraged to have sufficient pain management.  Patient/Guardian was advised Release of Information must be obtained prior to any record release in order to collaborate their care with an outside provider. Patient/Guardian was advised if they have not already done so to contact the registration department to sign all necessary forms in order for [place] to release information regarding their care.   Consent: Patient/Guardian gives verbal consent for treatment and assignment of benefits for services provided during this visit. Patient/Guardian expressed understanding and agreed to proceed.   This note was generated in part or whole with voice recognition software.  Voice recognition is usually quite accurate but there are transcription errors that can and very often do occur. I apologize for any typographical errors that were not detected and corrected.      Ursula Alert, MD 03/30/2022, 8:27 AM

## 2022-04-02 ENCOUNTER — Other Ambulatory Visit: Payer: Self-pay

## 2022-04-02 MED ORDER — ONDANSETRON 8 MG PO TBDP: 8.0000 mg | ORAL_TABLET | Freq: Three times a day (TID) | ORAL | 0 refills | Status: DC | PRN

## 2022-04-16 ENCOUNTER — Ambulatory Visit: Payer: 59 | Admitting: Internal Medicine

## 2022-04-17 ENCOUNTER — Encounter: Payer: Self-pay | Admitting: Internal Medicine

## 2022-04-17 ENCOUNTER — Ambulatory Visit (INDEPENDENT_AMBULATORY_CARE_PROVIDER_SITE_OTHER): Payer: 59 | Admitting: Internal Medicine

## 2022-04-17 VITALS — BP 118/60 | HR 76 | Temp 98.5°F | Ht 66.0 in | Wt 212.8 lb

## 2022-04-17 DIAGNOSIS — E785 Hyperlipidemia, unspecified: Secondary | ICD-10-CM | POA: Diagnosis not present

## 2022-04-17 DIAGNOSIS — Z1329 Encounter for screening for other suspected endocrine disorder: Secondary | ICD-10-CM

## 2022-04-17 DIAGNOSIS — M1990 Unspecified osteoarthritis, unspecified site: Secondary | ICD-10-CM

## 2022-04-17 DIAGNOSIS — Z23 Encounter for immunization: Secondary | ICD-10-CM | POA: Diagnosis not present

## 2022-04-17 DIAGNOSIS — Z1389 Encounter for screening for other disorder: Secondary | ICD-10-CM | POA: Diagnosis not present

## 2022-04-17 DIAGNOSIS — N3000 Acute cystitis without hematuria: Secondary | ICD-10-CM

## 2022-04-17 DIAGNOSIS — E669 Obesity, unspecified: Secondary | ICD-10-CM

## 2022-04-17 DIAGNOSIS — K219 Gastro-esophageal reflux disease without esophagitis: Secondary | ICD-10-CM

## 2022-04-17 MED ORDER — PHENTERMINE HCL 37.5 MG PO TABS: ORAL_TABLET | ORAL | 0 refills | Status: DC

## 2022-04-17 MED ORDER — PHENTERMINE HCL 37.5 MG PO TABS: 37.5000 mg | ORAL_TABLET | Freq: Every day | ORAL | 0 refills | Status: DC

## 2022-04-17 MED ORDER — FAMOTIDINE 20 MG PO TABS: 20.0000 mg | ORAL_TABLET | Freq: Two times a day (BID) | ORAL | 3 refills | Status: DC

## 2022-04-17 MED ORDER — NITROFURANTOIN MONOHYD MACRO 100 MG PO CAPS: 100.0000 mg | ORAL_CAPSULE | Freq: Two times a day (BID) | ORAL | 1 refills | Status: DC

## 2022-04-17 MED ORDER — DICLOFENAC SODIUM 1 % EX GEL
CUTANEOUS | 11 refills | Status: DC
Start: 2022-04-17 — End: 2022-09-20

## 2022-04-17 MED ORDER — FLUTICASONE PROPIONATE 50 MCG/ACT NA SUSP: NASAL | 11 refills | Status: DC

## 2022-04-17 NOTE — Patient Instructions (Addendum)
Mammogram due 09/13/22 Consider new hep B vaccine x 2 doses 1 month apart  Liraglutide Injection (Weight Management) What is this medication? LIRAGLUTIDE (LIR a GLOO tide) promotes weight loss. It may also be used to maintain weight loss. It works by decreasing appetite. Changes to diet and exercise are often combined with this medication. This medicine may be used for other purposes; ask your health care provider or pharmacist if you have questions. COMMON BRAND NAME(S): Saxenda What should I tell my care team before I take this medication? They need to know if you have any of these conditions: Endocrine tumors (MEN 2) or if someone in your family had these tumors Gallbladder disease High cholesterol History of alcohol abuse problem History of pancreatitis Kidney disease or if you are on dialysis Liver disease Previous swelling of the tongue, face, or lips with difficulty breathing, difficulty swallowing, hoarseness, or tightening of the throat Stomach problems Suicidal thoughts, plans, or attempt; a previous suicide attempt by you or a family member Thyroid cancer or if someone in your family had thyroid cancer An unusual or allergic reaction to liraglutide, other medications, foods, dyes, or preservatives Pregnant or trying to get pregnant Breast-feeding How should I use this medication? This medication is for injection under the skin of your upper leg, stomach area, or upper arm. You will be taught how to prepare and give this medication. Use exactly as directed. Take your medication at regular intervals. Do not take it more often than directed. This medication comes with INSTRUCTIONS FOR USE. Ask your pharmacist for directions on how to use this medication. Read the information carefully. Talk to your pharmacist or care team if you have questions. It is important that you put your used needles and syringes in a special sharps container. Do not put them in a trash can. If you do not have  a sharps container, call your pharmacist or care team to get one. A special MedGuide will be given to you by the pharmacist with each prescription and refill. Be sure to read this information carefully each time. Talk to your care team about the use of this medication in children. While it may be prescribed for children as young as 66 years of age for selected conditions, precautions do apply. Overdosage: If you think you have taken too much of this medicine contact a poison control center or emergency room at once. NOTE: This medicine is only for you. Do not share this medicine with others. What if I miss a dose? If you miss a dose, take it as soon as you can. If it is almost time for your next dose, take only that dose. Do not take double or extra doses. If you miss your dose for 3 days or more, call your care team to talk about how to restart this medicine. What may interact with this medication? Insulin and other medications for diabetes This list may not describe all possible interactions. Give your health care provider a list of all the medicines, herbs, non-prescription drugs, or dietary supplements you use. Also tell them if you smoke, drink alcohol, or use illegal drugs. Some items may interact with your medicine. What should I watch for while using this medication? Visit your care team for regular checks on your progress. Drink plenty of fluids while taking this medication. Check with your care team if you get an attack of severe diarrhea, nausea, and vomiting. The loss of too much body fluid can make it dangerous for you to  take this medication. This medication may affect blood sugar levels. Ask your care team if changes in diet or medications are needed if you have diabetes. Patients and their families should watch out for worsening depression or thoughts of suicide. Also watch out for sudden changes in feelings such as feeling anxious, agitated, panicky, irritable, hostile, aggressive,  impulsive, severely restless, overly excited and hyperactive, or not being able to sleep. If this happens, especially at the beginning of treatment or after a change in dose, call your care team. Women should inform their care team if they wish to become pregnant or think they might be pregnant. Losing weight while pregnant is not advised and may cause harm to the unborn child. Talk to your care team for more information. What side effects may I notice from receiving this medication? Side effects that you should report to your care team as soon as possible: Allergic reactions or angioedema--skin rash, itching, hives, swelling of the face, eyes, lips, tongue, arms, or legs, trouble swallowing or breathing Fast or irregular heartbeat Gallbladder problems--severe stomach pain, nausea, vomiting, fever Kidney injury--decrease in the amount of urine, swelling of the ankles, hands, or feet Pancreatitis--severe stomach pain that spreads to your back or gets worse after eating or when touched, fever, nausea, vomiting Thoughts of suicide or self-harm, worsening mood, feelings of depression Thyroid cancer--new mass or lump in the neck, pain or trouble swallowing, trouble breathing, hoarseness Side effects that usually do not require medical attention (report to your care team if they continue or are bothersome): Constipation Dizziness Fatigue Headache Loss of Appetite Nausea Upset stomach This list may not describe all possible side effects. Call your doctor for medical advice about side effects. You may report side effects to FDA at 1-800-FDA-1088. Where should I keep my medication? Keep out of the reach of children and pets. Store unopened pen in a refrigerator between 2 and 8 degrees C (36 and 46 degrees F). Do not freeze or use if the medication has been frozen. Protect from light and excessive heat. After you first use the pen, it can be stored at room temperature between 15 and 30 degrees C (59 and  86 degrees F) or in a refrigerator. Throw away your used pen after 30 days or after the expiration date, whichever comes first. Do not store your pen with the needle attached. If the needle is left on, medication may leak from the pen. NOTE: This sheet is a summary. It may not cover all possible information. If you have questions about this medicine, talk to your doctor, pharmacist, or health care provider.  2023 Elsevier/Gold Standard (2020-07-29 00:00:00)   Semaglutide Injection (Weight Management) What is this medication? SEMAGLUTIDE (SEM a GLOO tide) promotes weight loss. It may also be used to maintain weight loss. It works by decreasing appetite. Changes to diet and exercise are often combined with this medication. This medicine may be used for other purposes; ask your health care provider or pharmacist if you have questions. COMMON BRAND NAME(S): DTOIZT What should I tell my care team before I take this medication? They need to know if you have any of these conditions: Endocrine tumors (MEN 2) or if someone in your family had these tumors Eye disease, vision problems Gallbladder disease History of depression or mental health disease History of pancreatitis Kidney disease Stomach or intestine problems Suicidal thoughts, plans, or attempt; a previous suicide attempt by you or a family member Thyroid cancer or if someone in your family had  thyroid cancer An unusual or allergic reaction to semaglutide, other medications, foods, dyes, or preservatives Pregnant or trying to get pregnant Breast-feeding How should I use this medication? This medication is injected under the skin. You will be taught how to prepare and give it. Take it as directed on the prescription label. It is given once every week (every 7 days). Keep taking it unless your care team tells you to stop. It is important that you put your used needles and pens in a special sharps container. Do not put them in a trash can. If  you do not have a sharps container, call your pharmacist or care team to get one. A special MedGuide will be given to you by the pharmacist with each prescription and refill. Be sure to read this information carefully each time. This medication comes with INSTRUCTIONS FOR USE. Ask your pharmacist for directions on how to use this medication. Read the information carefully. Talk to your pharmacist or care team if you have questions. Talk to your care team about the use of this medication in children. While it may be prescribed for children as young as 12 years for selected conditions, precautions do apply. Overdosage: If you think you have taken too much of this medicine contact a poison control center or emergency room at once. NOTE: This medicine is only for you. Do not share this medicine with others. What if I miss a dose? If you miss a dose and the next scheduled dose is more than 2 days away, take the missed dose as soon as possible. If you miss a dose and the next scheduled dose is less than 2 days away, do not take the missed dose. Take the next dose at your regular time. Do not take double or extra doses. If you miss your dose for 2 weeks or more, take the next dose at your regular time or call your care team to talk about how to restart this medication. What may interact with this medication? Insulin and other medications for diabetes This list may not describe all possible interactions. Give your health care provider a list of all the medicines, herbs, non-prescription drugs, or dietary supplements you use. Also tell them if you smoke, drink alcohol, or use illegal drugs. Some items may interact with your medicine. What should I watch for while using this medication? Visit your care team for regular checks on your progress. It may be some time before you see the benefit from this medication. Drink plenty of fluids while taking this medication. Check with your care team if you have severe  diarrhea, nausea, and vomiting, or if you sweat a lot. The loss of too much body fluid may make it dangerous for you to take this medication. This medication may affect blood sugar levels. Ask your care team if changes in diet or medications are needed if you have diabetes. If you or your family notice any changes in your behavior, such as new or worsening depression, thoughts of harming yourself, anxiety, other unusual or disturbing thoughts, or memory loss, call your care team right away. Women should inform their care team if they wish to become pregnant or think they might be pregnant. Losing weight while pregnant is not advised and may cause harm to the unborn child. Talk to your care team for more information. What side effects may I notice from receiving this medication? Side effects that you should report to your care team as soon as possible: Allergic reactions--skin rash,  itching, hives, swelling of the face, lips, tongue, or throat Change in vision Dehydration--increased thirst, dry mouth, feeling faint or lightheaded, headache, dark yellow or brown urine Gallbladder problems--severe stomach pain, nausea, vomiting, fever Heart palpitations--rapid, pounding, or irregular heartbeat Kidney injury--decrease in the amount of urine, swelling of the ankles, hands, or feet Pancreatitis--severe stomach pain that spreads to your back or gets worse after eating or when touched, fever, nausea, vomiting Thoughts of suicide or self-harm, worsening mood, feelings of depression Thyroid cancer--new mass or lump in the neck, pain or trouble swallowing, trouble breathing, hoarseness Side effects that usually do not require medical attention (report to your care team if they continue or are bothersome): Diarrhea Loss of appetite Nausea Stomach pain Vomiting This list may not describe all possible side effects. Call your doctor for medical advice about side effects. You may report side effects to FDA at  1-800-FDA-1088. Where should I keep my medication? Keep out of the reach of children and pets. Refrigeration (preferred): Store in the refrigerator. Do not freeze. Keep this medication in the original container until you are ready to take it. Get rid of any unused medication after the expiration date. Room temperature: If needed, prior to cap removal, the pen can be stored at room temperature for up to 28 days. Protect from light. If it is stored at room temperature, get rid of any unused medication after 28 days or after it expires, whichever is first. It is important to get rid of the medication as soon as you no longer need it or it is expired. You can do this in two ways: Take the medication to a medication take-back program. Check with your pharmacy or law enforcement to find a location. If you cannot return the medication, follow the directions in the [facility]. NOTE: This sheet is a summary. It may not cover all possible information. If you have questions about this medicine, talk to your doctor, pharmacist, or health care provider.  2023 Elsevier/Gold Standard (2020-09-08 00:00:00)  Bempedoic Acid; Ezetimibe Tablets What is this medication? BEMPEDOIC ACID; EZETIMIBE (BEM pe DOE ik AS id; ez ET i mibe) treats high cholesterol. It works by reducing the amount of cholesterol made by your body. It also reduces the amount of cholesterol absorbed from the food you eat. This decreases the amount of bad cholesterol (such as LDL) in your blood. Changes to diet and exercise are often combined with this medication. This medicine may be used for other purposes; ask your health care provider or pharmacist if you have questions. COMMON BRAND NAME(S): NEXLIZET What should I tell my care team before I take this medication? They need to know if you have any of these conditions: Gout Kidney problems Liver disease Tendon problems An unusual or allergic reaction to bempedoic acid, ezetimibe, other  medications, foods, dyes, or preservatives Pregnant or trying to become pregnant Breast-feeding How should I use this medication? Take this medication by mouth. Take it as directed on the prescription label at the same time every day. Do not cut, crush, or chew this medication. Swallow the tablets whole. You can take it with or without food. If it upsets your stomach, take it with food. Keep taking it unless your care team tells you to stop. Take this medication at least 2 hours BEFORE or at least 4 hours AFTER administration of a bile acid sequestrant. Talk to your care team about the use of this medication in children. Special care may be needed. Take this medication by  mouth. Take it as directed on the prescription label at the same time every day. Do not cut, crush or chew this medication. Swallow the tablets whole. You can take it with or without food. If it upsets your stomach, take it with food. Keep taking it unless your care team tells you to stop.Take this medication at least 2 hours BEFORE or at least 4 hours AFTER administration of a bile acid sequestrant.Talk to your care team about the use of this medication in children. Special care may be needed. Overdosage: If you think you have taken too much of this medicine contact a poison control center or emergency room at once. NOTE: This medicine is only for you. Do not share this medicine with others. What if I miss a dose? If you miss a dose, take it as soon as you can. If it is almost time for your next dose, take only that dose. Do not take double or extra doses. What may interact with this medication? Do not take this medication with any of the following: Fenofibrate Gemfibrozil This medication may also interact with the following: Antacids Cyclosporine Other medications to lower cholesterol or triglycerides Pravastatin Simvastatin This list may not describe all possible interactions. Give your health care provider a list of all  the medicines, herbs, non-prescription drugs, or dietary supplements you use. Also tell them if you smoke, drink alcohol, or use illegal drugs. Some items may interact with your medicine. What should I watch for while using this medication? Visit your care team for regular checks on your progress. Tell your care team if your symptoms do not start to get better or if they get worse. Your care team may tell you to stop taking this medication if you develop muscle problems. If your muscle problems do not go away after stopping this medication, contact your care team. Taking this medication is only part of a total heart healthy program. Ask your care team if there are other changes you can make to improve your overall health. What side effects may I notice from receiving this medication? Side effects that you should report to your care team as soon as possible: Allergic reactions--skin rash, itching, hives, swelling of the face, lips, tongue, or throat High uric acid level--severe pain, redness, warmth, or swelling in joints, pain or trouble passing urine, pain in the lower back or sides Joint, muscle, or tendon pain, swelling, or stiffness Side effects that usually do not require medical attention (report to your care team if they continue or are bothersome): Diarrhea Fatigue Muscle spasms Stomach pain This list may not describe all possible side effects. Call your doctor for medical advice about side effects. You may report side effects to FDA at 1-800-FDA-1088. Where should I keep my medication? Keep out of the reach of children and pets. Store between 15 and 30 degrees C (59 and 86 degrees F). Keep this medication in the original container. Protect from moisture. Keep the container tightly closed. Do not throw out the packet in the container. It keeps the medication dry. Avoid exposure to extreme heat. Get rid of any unused medication after the expiration date. To get rid of medications that are  no longer needed or have expired: Take the medication to a medication take-back program. Check with your pharmacy or law enforcement to find a location. If you cannot return the medication, check the label or package insert to see if the medication should be thrown out in the trash or flushed down the  toilet. If you are not sure, ask your care team. If it is safe to put it in the trash, empty the medication out of the container. Mix the medication with cat litter, dirt, coffee grounds, or other unwanted substance. Seal the mixture in a bag or container. Put it in the trash. NOTE: This sheet is a summary. It may not cover all possible information. If you have questions about this medicine, talk to your doctor, pharmacist, or health care provider.  2023 Elsevier/Gold Standard (2021-06-27 00:00:00)

## 2022-04-17 NOTE — Progress Notes (Signed)
Chief Complaint  Patient presents with   Follow-up    4 month f/u   F/u  1. Gerd on pepcid 2. Allergic rhinitis flonase  3. Uti prevention takes macrobid bid x 5 days wants refills  4. Obesity wants refill adipex     Review of Systems  Constitutional:  Negative for weight loss.  HENT:  Negative for hearing loss.   Eyes:  Negative for blurred vision.  Respiratory:  Negative for shortness of breath.   Cardiovascular:  Negative for chest pain.  Gastrointestinal:  Negative for abdominal pain and blood in stool.  Genitourinary:  Negative for dysuria.  Musculoskeletal:  Negative for falls and joint pain.  Skin:  Negative for rash.  Neurological:  Negative for headaches.  Psychiatric/Behavioral:  Negative for depression.    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 after surgery, while on BCP   GERD (gastroesophageal reflux disease)    Headache, post-myelogram 09/11/2021   Hyperlipidemia    Left leg DVT ([employer])    fem/pop 12/07/20 after right shoulder surgery 11/30/20   Neuropathy    post surgery   Pneumonia    11/2021 seen at [facility]   PONV (postoperative nausea and vomiting)    PVD (peripheral vascular disease) ([place])    Spondylolisthesis    Past Surgical History:  Procedure Laterality Date   ABLATION     veins in lower legs   BACK SURGERY  07/2014   lumbar spine/ fusion 1 level   BREAST BIOPSY Left 2011   neg   CARPAL TUNNEL RELEASE Right 12/2015   [facility]   CARPAL TUNNEL RELEASE Left 12/2016   COLONOSCOPY WITH PROPOFOL N/A 02/29/2020    Procedure: COLONOSCOPY WITH PROPOFOL;  Surgeon: [name], MD;  Location: [place];  Service: Endoscopy;  Laterality: N/A;   ESOPHAGOGASTRODUODENOSCOPY (EGD) WITH PROPOFOL N/A 02/29/2020   Procedure: ESOPHAGOGASTRODUODENOSCOPY (EGD) WITH PROPOFOL;  Surgeon: [name], MD;  Location: [place];  Service: Endoscopy;  Laterality: N/A;   FOOT SURGERY Right 2015   broken navicular bone   HIP ARTHROSCOPY Right 05/2011   HIP SURGERY     left hip surgery 05/2018   JOINT REPLACEMENT Bilateral 12/2017 & 05/2018   hip   L5-S1 OLIF Dr. Rennis Harding back surgery 09/2019  09/28/2019   left ganglion cyst removal     12/23/19 Dr. Elvina Mattes   MASS EXCISION Left 12/17/2019   Procedure: EXCISION TUMOR FOOT DEEP LEFT;  Surgeon: Albertine Patricia, DPM;  Location: [place];  Service: Podiatry;  Laterality: Left;  LMA W/LOCAL   NASAL TURBINATE REDUCTION Bilateral 06/12/2019   Procedure: TURBINATE REDUCTION/SUBMUCOSAL RESECTION;  Surgeon: [name], MD;  Location: [place];  Service: ENT;  Laterality: Bilateral;   REPLACEMENT TOTAL HIP W/  RESURFACING IMPLANTS Right 12/2017   [facility]   right shoulder surgery     11/28/20 for bone spur, torn labrum, re attached biceps tendon, tear Dr. [name]   right shoulder surgery     12/22/21  total shoulder replacement [facility] Dr. [name]   ROTATOR CUFF REPAIR     Dr. [name] in 04/2019    SCLEROTHERAPY     veins in lower legs   SEPTOPLASTY Bilateral 06/12/2019   Procedure: SEPTOPLASTY;  Surgeon: [name], MD;  Location: [place];  Service: ENT;  Laterality: Bilateral;   SHOULDER SURGERY Right 12/2015   [facility] torn rotator cuff   SPINAL FUSION  2016   [facility] lumbar   TOTAL SHOULDER ARTHROPLASTY Right 12/22/2021   Procedure: TOTAL SHOULDER ARTHROPLASTY;  Surgeon: Netta Cedars, MD;  Location: [facility];  Service: Orthopedics;  Laterality: Right;  with ISB   Family History  Problem  Relation Age of Onset   Breast cancer Maternal Aunt    Breast cancer Paternal Aunt    Dementia Paternal Aunt    COPD Mother        quit smoking after 65 years   Arthritis Mother    Osteoarthritis Mother    Dementia Father        symptoms started in his 22s   Alzheimer's disease Father    Rheum arthritis Father    Rheum arthritis Other        FH   Dementia Other        "all my dad's side of the family"   Other Maternal Grandmother        got dementia as she got older    Dementia Paternal Grandmother    Social History   Socioeconomic History   Marital status: Married    Spouse name: fredrick   Number of children: 1   Years of education: Not on file   Highest education level: High school graduate  Occupational History    Comment: full time  Tobacco Use   Smoking status: Never   Smokeless tobacco: Never  Vaping Use   Vaping Use: Never used  Substance and Sexual Activity   Alcohol use: Not Currently    Alcohol/week: 1.0 standard drink of alcohol    Types: 1 Cans of beer per week    Comment: may have drink 1x/month   Drug use: Never   Sexual activity: Yes  Other Topics Concern   Not on file  Social History Narrative   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    Married 1 child    Lives at home with her husband   Right handed   Caffeine: maybe 1 cup/day (pepsi)   Social Determinants of Health   Financial Resource Strain: Medium Risk (04/30/2018)   Overall Financial Resource Strain (CARDIA)    Difficulty of Paying Living Expenses: Somewhat hard  Food Insecurity: Food Insecurity Present (04/30/2018)   Hunger Vital Sign    Worried About Running Out of Food in the Last Year: Sometimes true    Ran Out of Food in the Last Year: Sometimes true  Transportation Needs: No Transportation Needs (04/30/2018)   PRAPARE - Hydrologist (Medical): No    Lack of Transportation (Non-Medical): No  Physical Activity: Inactive (04/30/2018)    Exercise Vital Sign    Days of Exercise per Week: 0 days    Minutes of Exercise per Session: 0 min  Stress: Stress Concern Present (04/30/2018)   [place]    Feeling of Stress : Very much  Social Connections: Unknown (04/30/2018)   Social Connection and Isolation Panel [NHANES]    Frequency of Communication with Friends and  Family: Not on file    Frequency of Social Gatherings with Friends and Family: Not on file    Attends Religious Services: Never    Active Member of Clubs or Organizations: No    Attends Archivist Meetings: Never    Marital Status: Married  Human resources officer Violence: Not At Risk (04/30/2018)   Humiliation, Afraid, Rape, and Kick questionnaire    Fear of Current or Ex-Partner: No    Emotionally Abused: No    Physically Abused: No    Sexually Abused: No   Current Meds  Medication Sig   acetaminophen (TYLENOL) 650 MG CR tablet Take 1,300 mg by mouth every 8 (eight) hours as needed (pain/headaches.).   baclofen (LIORESAL) 10 MG tablet Take 1 tablet (10 mg total) by mouth 3 (three) times daily as needed for muscle spasms.   Buprenorphine HCl (BELBUCA) 450 MCG FILM Place 450 mcg inside cheek in the morning and at bedtime.   busPIRone (BUSPAR) 10 MG tablet Take 1 tablet (10 mg total) by mouth 3 (three) times daily.   cetirizine (ZYRTEC) 10 MG tablet Take 10 mg by mouth daily as needed for allergies.   dexlansoprazole (DEXILANT) 60 MG capsule TAKE 1 CAPSULE BY MOUTH EVERY DAY   DULoxetine (CYMBALTA) 60 MG capsule TAKE 1 CAPSULE BY MOUTH EVERY DAY   estradiol (ESTRACE) 0.1 MG/GM vaginal cream Place 1 Applicatorful vaginally every 14 (fourteen) days.   ezetimibe (ZETIA) 10 MG tablet TAKE 1 TABLET BY MOUTH EVERY DAY   hydrOXYzine (ATARAX) 10 MG tablet TAKE 1 TABLET BY MOUTH DAILY AS NEEDED FOR ANXIETY.   methocarbamol (ROBAXIN) 500 MG tablet Take by mouth.   nitrofurantoin,  macrocrystal-monohydrate, (MACROBID) 100 MG capsule Take 1 capsule (100 mg total) by mouth 2 (two) times daily. X 5 days for UTI   ondansetron (ZOFRAN-ODT) 8 MG disintegrating tablet Take 1 tablet (8 mg total) by mouth every 8 (eight) hours as needed for nausea or vomiting.   oxyCODONE-acetaminophen (PERCOCET) 10-325 MG tablet Take 1 tablet by mouth in the morning, at noon, in the evening, and at bedtime.   phentermine (ADIPEX-P) 37.5 MG tablet Take 1 tablet (37.5 mg total) by mouth daily before breakfast. 2/2   pregabalin (LYRICA) 50 MG capsule Take 100 mg by mouth 2 (two) times daily.   psyllium (METAMUCIL SMOOTH TEXTURE) 28 % packet Take 1 packet by mouth every other day.   rivaroxaban (XARELTO) 10 MG TABS tablet Take 10 mg by mouth in the morning. (1000)   traZODone (DESYREL) 100 MG tablet TAKE 2 TABLETS (200 MG TOTAL) BY MOUTH AT BEDTIME AS NEEDED FOR SLEEP. (Patient taking differently: Take 150 mg by mouth at bedtime.)   TURMERIC PO Take 2,000 mg by mouth in the morning. 1000 mg/capsule   valACYclovir (VALTREX) 500 MG tablet Take 500 mg by mouth 2 (two) times daily as needed (outbreaks).   [DISCONTINUED] diclofenac Sodium (VOLTAREN) 1 % GEL APPLY 2 GRAMS TO UPPER BODY FOUR TIMES DAILY AS NEEDED AND 4 GRAMS TO LOWER BODY FOUR TIMES DAILY AS NEEDED   [DISCONTINUED] famotidine (PEPCID) 20 MG tablet Take 1 tablet (20 mg total) by mouth 2 (two) times daily. TAKE 1 TABLET BY MOUTH EVERYDAY AT BEDTIME   [DISCONTINUED] fluticasone (FLONASE) 50 MCG/ACT nasal spray SHAKE LIQUID AND USE 1 TO 2 SPRAYS IN EACH NOSTRIL DAILY AS NEEDED. MAX DOSE 2 SPRAYS IN EACH NOSTRIL   [DISCONTINUED] phentermine (ADIPEX-P) 37.5 MG tablet TAKE 1 TABLET(37.5 MG) BY MOUTH DAILY BEFORE BREAKFAST  Allergies  Allergen Reactions   Amoxicillin Hives   Crestor [Rosuvastatin]     Myalgia    Septra [Sulfamethoxazole-Trimethoprim] Hives        Sulfamethoxazole Hives   Trimethoprim Hives   Zanaflex [Tizanidine] Other (See  Comments)    Weak muscles     Keflex [Cephalexin] Rash        No results found for this or any previous visit (from the past 2160 hour(s)). Objective  Body mass index is 34.35 kg/m. Wt Readings from Last 3 Encounters:  04/17/22 212 lb 12.8 oz (96.5 kg)  12/22/21 205 lb 9.6 oz (93.3 kg)  12/15/21 205 lb 9.6 oz (93.3 kg)   Temp Readings from Last 3 Encounters:  04/17/22 98.5 F (36.9 C) (Oral)  12/23/21 97.9 F (36.6 C)  12/15/21 98.2 F (36.8 C) (Oral)   BP Readings from Last 3 Encounters:  04/17/22 118/60  12/23/21 105/67  12/15/21 100/70   Pulse Readings from Last 3 Encounters:  04/17/22 76  12/23/21 88  12/15/21 78    Physical Exam Vitals and nursing note reviewed.  Constitutional:      Appearance: Normal appearance. She is well-developed and well-groomed.  HENT:     Head: Normocephalic and atraumatic.  Eyes:     Conjunctiva/sclera: Conjunctivae normal.     Pupils: Pupils are equal, round, and reactive to light.  Cardiovascular:     Rate and Rhythm: Normal rate and regular rhythm.     Heart sounds: Normal heart sounds. No murmur heard. Pulmonary:     Effort: Pulmonary effort is normal.     Breath sounds: Normal breath sounds.  Abdominal:     General: Abdomen is flat. Bowel sounds are normal.     Tenderness: There is no abdominal tenderness.  Musculoskeletal:        General: No tenderness.  Skin:    General: Skin is warm and dry.  Neurological:     General: No focal deficit present.     Mental Status: She is alert and oriented to person, place, and time. Mental status is at baseline.     Cranial Nerves: Cranial nerves 2-12 are intact.     Gait: Gait is intact.     Comments: Walks with cane  Psychiatric:        Attention and Perception: Attention and perception normal.        Mood and Affect: Mood and affect normal.        Speech: Speech normal.        Behavior: Behavior normal. Behavior is cooperative.        Thought Content: Thought content  normal.        Cognition and Memory: Cognition and memory normal.        Judgment: Judgment normal.     Assessment  Plan  Hyperlipidemia, unspecified hyperlipidemia type - Plan: Comprehensive metabolic panel, Lipid panel, CBC with Differential/Platelet   Obesity (BMI 30-39.9) - Plan: phentermine (ADIPEX-P) 37.5 MG tablet  Arthritis - Plan: diclofenac Sodium (VOLTAREN) 1 % GEL  Acute cystitis without hematuria - Plan: nitrofurantoin, macrocrystal-monohydrate, (MACROBID) 100 MG capsule  Gastroesophageal reflux disease without esophagitis - Plan: famotidine (PEPCID) 20 MG tablet   HM Declines flu shot  Tdap utd  Pfizer 4/4 shingrix 2/2 Prevnar 20 utd      Consider hep B vaccine in future NR 08/31/15  -disc new Hep B vaccine in the future x 2 doses   rx in mail   HCV neg 08/31/15  Pap neg 09/11/16 neg neg HPV  Will f/u Dr. Leafy Ro has IUD checked 06/28/20 and pap neg neg HPV 06/25/22 fu Dr. Leafy Ro   Mammogram 08/12/19 ob/gyn ordered Dr. Leafy Ro negative sch 08/16/20 and negative  09/12/21 negative ordered 2024    dexa 01/04/20 normal    Colonoscopy utd had 02/2020 f/u in 10 years with EGD Dr. Allen Norris    Dermatology. [facility] due to see 09/28/21 yearly    Never smoker    Rec healthy diet and exercise   Other providers Former PCP [facility] Dr. Garen Grams then [place] [facility] requested all imaging Xrays, MRIS, CT Pain Dr. Alvira Monday in [place]  -had epidural neck injection    Spine specialist Dr. Rennis Harding ortho spine in [place] [facility]-Dr. Tami Ribas stated pt did not have allergies of note had testing Psychiatry and therapy-following  PT [facility] Dr. Tyler Pita, Dr. Lonia Blood [facility]  Ortho spine Dr. Rennis Harding  Ortho Dr .[name] [facility] in [place]  surgery 12/22/21 right shouldercarolina NS Dr. Christella Noa as of 12/2021  [facility]  HLD-Dr. Debara Pickett stopped crestor due pain   Of note meds pt gets from PCP  Valtrex   adipex  Macrobid  Robaxin  voltaren gel    04/14/19 ENT Dr. Tami Ribas sinusitis deviated nasal septum right nasal endoscopy and left nasacort 55 nasal hoarse voice small hemorrhage right cord 2/2 coughing and inhalers f/u in 6 weeks after shoulder surgery      Provider: Dr. Olivia Mackie McLean-Scocuzza-Internal Medicine

## 2022-05-14 ENCOUNTER — Telehealth (INDEPENDENT_AMBULATORY_CARE_PROVIDER_SITE_OTHER): Payer: 59 | Admitting: Psychiatry

## 2022-05-14 ENCOUNTER — Encounter: Payer: Self-pay | Admitting: Psychiatry

## 2022-05-14 DIAGNOSIS — F411 Generalized anxiety disorder: Secondary | ICD-10-CM | POA: Diagnosis not present

## 2022-05-14 DIAGNOSIS — G4701 Insomnia due to medical condition: Secondary | ICD-10-CM

## 2022-05-14 MED ORDER — TRAZODONE HCL 100 MG PO TABS: 200.0000 mg | ORAL_TABLET | Freq: Every evening | ORAL | 1 refills | Status: DC | PRN

## 2022-05-14 NOTE — Progress Notes (Unsigned)
Virtual Visit via Video Note  I connected with Maureen Randall on 05/14/22 at  3:00 PM EST by a video enabled telemedicine application and verified that I am speaking with the correct person using two identifiers.  Location Provider Location : [facility] Patient Location : Home  Participants: Patient , Provider    I discussed the limitations of evaluation and management by telemedicine and the availability of in person appointments. The patient expressed understanding and agreed to proceed.   I discussed the assessment and treatment plan with the patient. The patient was provided an opportunity to ask questions and all were answered. The patient agreed with the plan and demonstrated an understanding of the instructions.   The patient was advised to call back or seek an in-person evaluation if the symptoms worsen or if the condition fails to improve as anticipated.   [place] MD OP Progress Note  05/14/2022 3:27 PM Maureen Randall  MRN:  [id number]  Chief Complaint:  Chief Complaint  Patient presents with   Follow-up   Medication Refill   Anxiety   Depression   HPI: Maureen Randall is a 56 year old Caucasian female, married, lives in [place], on disability, has a history of GAD, insomnia, chronic pain, primary osteoarthritis, radiculopathy, degenerative disc disease, spondylolisthesis, bilateral carpal tunnel syndrome was evaluated by telemedicine today.  Patient today reports she had a fall at [place] recently.  She reports she fell on her right shoulder, tripped on her foot, reports she is currently in pain.  Has reached out to her orthopedic provider.  She is scheduled for MRI.  Currently on pain medications including Belbuca, Percocet, Lyrica for management of her pain.  However patient continues to be uncontrolled especially of her neck, back, may need another surgery.  Has not made her decision yet.  Patient reports pain does affect her sleep otherwise the trazodone 200 mg at  helpful.  Denies side effects.  Denies any significant depression or anxiety symptoms.  Currently compliant on the BuSpar, Cymbalta.  Denies side effects.  Patient denies any suicidality, homicidality or perceptual disturbances.  Patient denies any other concerns today.  Visit Diagnosis:    ICD-10-CM   1. GAD (generalized anxiety disorder)  F41.1 traZODone (DESYREL) 100 MG tablet    2. Insomnia due to medical condition  G47.01    pain      Past Psychiatric History: Reviewed past psychiatric history from progress note on 04/30/2018.  Past Medical History:  Past Medical History:  Diagnosis Date   Anxiety    Arthritis    osteoarthritis-joints, feet, shoulder   Asthma    previously diagnosed after a bout of bronchitis/ no problems since   Bulging of cervical intervertebral disc without myelopathy    C6-C7   Bursitis    b/l hips   Chronic pain syndrome    COPD (chronic obstructive pulmonary disease) ([place]) 2004   patient states probably bronchitis   Depression    DVT (deep venous thrombosis) ([place]) 2012   right leg. after surgery, while on BCP   GERD (gastroesophageal reflux disease)    Headache, post-myelogram 09/11/2021   Hyperlipidemia    Left leg DVT ([place])    fem/pop 12/07/20 after right shoulder surgery 11/30/20   Neuropathy    post surgery   Pneumonia    11/2021 seen at [facility]   PONV (postoperative nausea and vomiting)    PVD (peripheral vascular disease) ([place])    Spondylolisthesis     Past Surgical History:  Procedure Laterality  Date   ABLATION     veins in lower legs   BACK SURGERY  07/2014   lumbar spine/ fusion 1 level   BREAST BIOPSY Left 2011   neg   CARPAL TUNNEL RELEASE Right 12/2015   [facility]   CARPAL TUNNEL RELEASE Left 12/2016   COLONOSCOPY WITH PROPOFOL N/A 02/29/2020   Procedure: COLONOSCOPY WITH PROPOFOL;  Surgeon: [name], MD;  Location: [facility];  Service: Endoscopy;  Laterality: N/A;   ESOPHAGOGASTRODUODENOSCOPY (EGD) WITH  PROPOFOL N/A 02/29/2020   Procedure: ESOPHAGOGASTRODUODENOSCOPY (EGD) WITH PROPOFOL;  Surgeon: [name], MD;  Location: [facility];  Service: Endoscopy;  Laterality: N/A;   FOOT SURGERY Right 2015   broken navicular bone   HIP ARTHROSCOPY Right 05/2011   HIP SURGERY     left hip surgery 05/2018   JOINT REPLACEMENT Bilateral 12/2017 & 05/2018   hip   L5-S1 OLIF Dr. Rennis Harding back surgery 09/2019  09/28/2019   left ganglion cyst removal     12/23/19 Dr. Elvina Mattes   MASS EXCISION Left 12/17/2019   Procedure: EXCISION TUMOR FOOT DEEP LEFT;  Surgeon: Albertine Patricia, DPM;  Location: [facility];  Service: Podiatry;  Laterality: Left;  LMA W/LOCAL   NASAL TURBINATE REDUCTION Bilateral 06/12/2019   Procedure: TURBINATE REDUCTION/SUBMUCOSAL RESECTION;  Surgeon: [name], MD;  Location: [facility];  Service: ENT;  Laterality: Bilateral;   REPLACEMENT TOTAL HIP W/  RESURFACING IMPLANTS Right 12/2017   [facility]   right shoulder surgery     11/28/20 for bone spur, torn labrum, re attached biceps tendon, tear Dr. [name]   right shoulder surgery     12/22/21 total shoulder replacement [facility] Dr. Malva Cogan CUFF REPAIR     Dr. [name] in 04/2019    SCLEROTHERAPY     veins in lower legs   SEPTOPLASTY Bilateral 06/12/2019   Procedure: SEPTOPLASTY;  Surgeon: [name], MD;  Location: [facility];  Service: ENT;  Laterality: Bilateral;   SHOULDER SURGERY Right 12/2015   [facility] torn rotator cuff   SPINAL FUSION  2016   [facility] lumbar   TOTAL SHOULDER ARTHROPLASTY Right 12/22/2021   Procedure: TOTAL SHOULDER ARTHROPLASTY;  Surgeon: Netta Cedars, MD;  Location: [facility];  Service: Orthopedics;  Laterality: Right;  with ISB    Family Psychiatric History: Reviewed family psychiatric history from progress note on 04/30/2018.  Family History:  Family History  Problem Relation Age of Onset   Breast cancer Maternal Aunt     Breast cancer Paternal Aunt    Dementia Paternal Aunt    COPD Mother        quit smoking after 2 years   Arthritis Mother    Osteoarthritis Mother    Dementia Father        symptoms started in his 82s   Alzheimer's disease Father    Rheum arthritis Father    Rheum arthritis Other        FH   Dementia Other        "all my dad's side of the family"   Other Maternal Grandmother        got dementia as she got older    Dementia Paternal Grandmother     Social History: Reviewed social history from progress note on 04/30/2018. Social History   Socioeconomic History   Marital status: Married    Spouse name: fredrick   Number of children: 1   Years of education: Not  on file   Highest education level: High school graduate  Occupational History    Comment: full time  Tobacco Use   Smoking status: Never   Smokeless tobacco: Never  Vaping Use   Vaping Use: Never used  Substance and Sexual Activity   Alcohol use: Not Currently    Alcohol/week: 1.0 standard drink of alcohol    Types: 1 Cans of beer per week    Comment: may have drink 1x/month   Drug use: Never   Sexual activity: Yes  Other Topics Concern   Not on file  Social History Narrative   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    Married 1 child    Lives at home with her husband   Right handed   Caffeine: maybe 1 cup/day (pepsi)   Social Determinants of Health   Financial Resource Strain: Medium Risk (04/30/2018)   Overall Financial Resource Strain (CARDIA)    Difficulty of Paying Living Expenses: Somewhat hard  Food Insecurity: Food Insecurity Present (04/30/2018)   Hunger Vital Sign    Worried About Running Out of Food in the Last Year: Sometimes true    Ran Out of Food in the Last Year: Sometimes true  Transportation Needs: No Transportation Needs (04/30/2018)   PRAPARE - Hydrologist (Medical): No    Lack of Transportation (Non-Medical): No  Physical Activity: Inactive  (04/30/2018)   Exercise Vital Sign    Days of Exercise per Week: 0 days    Minutes of Exercise per Session: 0 min  Stress: Stress Concern Present (04/30/2018)   [place]    Feeling of Stress : Very much  Social Connections: Unknown (04/30/2018)   Social Connection and Isolation Panel [NHANES]    Frequency of Communication with Friends and Family: Not on file    Frequency of Social Gatherings with Friends and Family: Not on file    Attends Religious Services: Never    Active Member of Clubs or Organizations: No    Attends Archivist Meetings: Never    Marital Status: Married    Allergies:  Allergies  Allergen Reactions   Amoxicillin Hives   Crestor [Rosuvastatin]     Myalgia    Septra [Sulfamethoxazole-Trimethoprim] Hives        Sulfamethoxazole Hives   Trimethoprim Hives   Zanaflex [Tizanidine] Other (See Comments)    Weak muscles     Keflex [Cephalexin] Rash         Metabolic Disorder Labs: Lab Results  Component Value Date   HGBA1C 5.5 12/14/2021   MPG 111.15 12/14/2021   No results found for: "PROLACTIN" Lab Results  Component Value Date   CHOL 235 (H) 10/03/2021   TRIG 116 10/03/2021   HDL 65 10/03/2021   CHOLHDL 3.6 10/03/2021   VLDL 22.6 01/23/2021   LDLCALC 150 (H) 10/03/2021   LDLCALC 135 (H) 01/23/2021   Lab Results  Component Value Date   TSH 1.49 09/22/2020   TSH 3.78 01/05/2019    Therapeutic Level Labs: No results found for: "LITHIUM" No results found for: "VALPROATE" No results found for: "CBMZ"  Current Medications: Current Outpatient Medications  Medication Sig Dispense Refill   acetaminophen (TYLENOL) 650 MG CR tablet Take 1,300 mg by mouth every 8 (eight) hours as needed (pain/headaches.).     baclofen (LIORESAL) 10 MG tablet Take 1 tablet (10 mg total) by mouth 3 (three) times daily as needed for muscle spasms. [place]  each 1   Buprenorphine HCl (BELBUCA)  450 MCG FILM Place 450 mcg inside cheek in the morning and at bedtime.     busPIRone (BUSPAR) 10 MG tablet Take 1 tablet (10 mg total) by mouth 3 (three) times daily. 270 tablet 0   cetirizine (ZYRTEC) 10 MG tablet Take 10 mg by mouth daily as needed for allergies.     dexlansoprazole (DEXILANT) 60 MG capsule TAKE 1 CAPSULE BY MOUTH EVERY DAY 90 capsule 0   diclofenac Sodium (VOLTAREN) 1 % GEL APPLY 2 GRAMS TO UPPER BODY FOUR TIMES DAILY AS NEEDED AND 4 GRAMS TO LOWER BODY FOUR TIMES DAILY AS NEEDED 100 g 11   DULoxetine (CYMBALTA) 60 MG capsule TAKE 1 CAPSULE BY MOUTH EVERY DAY 90 capsule 1   estradiol (ESTRACE) 0.1 MG/GM vaginal cream Place 1 Applicatorful vaginally every 14 (fourteen) days.     ezetimibe (ZETIA) 10 MG tablet TAKE 1 TABLET BY MOUTH EVERY DAY 90 tablet 1   famotidine (PEPCID) 20 MG tablet Take 1 tablet (20 mg total) by mouth 2 (two) times daily. TAKE 1 TABLET BY MOUTH EVERYDAY AT BEDTIME 180 tablet 3   fluticasone (FLONASE) 50 MCG/ACT nasal spray SHAKE LIQUID AND USE 1 TO 2 SPRAYS IN EACH NOSTRIL DAILY AS NEEDED. MAX DOSE 2 SPRAYS IN EACH NOSTRIL 16 g 11   hydrOXYzine (ATARAX) 10 MG tablet TAKE 1 TABLET BY MOUTH DAILY AS NEEDED FOR ANXIETY. 90 tablet 1   methocarbamol (ROBAXIN) 500 MG tablet Take by mouth.     naloxone (NARCAN) nasal spray 4 mg/0.1 mL      nitrofurantoin, macrocrystal-monohydrate, (MACROBID) 100 MG capsule Take 1 capsule (100 mg total) by mouth 2 (two) times daily. X 5 days for UTI 10 capsule 1   ondansetron (ZOFRAN-ODT) 8 MG disintegrating tablet Take 1 tablet (8 mg total) by mouth every 8 (eight) hours as needed for nausea or vomiting. 30 tablet 0   oxyCODONE-acetaminophen (PERCOCET) 10-325 MG tablet Take 1 tablet by mouth in the morning, at noon, in the evening, and at bedtime.     phentermine (ADIPEX-P) 37.5 MG tablet Rx 1/2 TAKE 1 TABLET(37.5 MG) BY MOUTH DAILY BEFORE BREAKFAST 60 tablet 0   pregabalin (LYRICA) 50 MG capsule Take 100 mg by mouth 2 (two) times  daily.     psyllium (METAMUCIL SMOOTH TEXTURE) 28 % packet Take 1 packet by mouth every other day.     rivaroxaban (XARELTO) 10 MG TABS tablet Take 10 mg by mouth in the morning. (1000)     TURMERIC PO Take 2,000 mg by mouth in the morning. 1000 mg/capsule     valACYclovir (VALTREX) 500 MG tablet Take 500 mg by mouth 2 (two) times daily as needed (outbreaks).     traZODone (DESYREL) 100 MG tablet Take 2 tablets (200 mg total) by mouth at bedtime as needed for sleep. 180 tablet 1   No current facility-administered medications for this visit.     Musculoskeletal: Strength & Muscle Tone:  UTA Gait & Station:  Seated Patient leans: N/A  Psychiatric Specialty Exam: Review of Systems  Musculoskeletal:  Positive for arthralgias, back pain and neck pain.       Right shoulder pain, s/p fall  All other systems reviewed and are negative.   There were no vitals taken for this visit.There is no height or weight on file to calculate BMI.  General Appearance: Casual  Eye Contact:  Fair  Speech:  Clear and Coherent  Volume:  Normal  Mood:  Anxious  Affect:  Congruent  Thought Process:  Goal Directed and Descriptions of Associations: Intact  Orientation:  Full (Time, Place, and Person)  Thought Content: Logical   Suicidal Thoughts:  No  Homicidal Thoughts:  No  Memory:  Immediate;   Fair Recent;   Fair Remote;   Fair  Judgement:  Fair  Insight:  Fair  Psychomotor Activity:  Normal  Concentration:  Concentration: Fair and Attention Span: Fair  Recall:  [employer] of Knowledge: Fair  Language: Fair  Akathisia:  No  Handed:  Right  AIMS (if indicated): not done  Assets:  Communication Skills Desire for Improvement Housing Intimacy Social Support  ADL's:  Intact  Cognition: WNL  Sleep:   restless at night due to pain   Screenings: AIMS    Flowsheet Row Video Visit from 12/26/2021 in [facility] Total Score 0      GAD-7    Flowsheet Row  Video Visit from 05/14/2022 in [facility] Video Visit from 03/29/2022 in [facility] Video Visit from 12/26/2021 in [facility] Office Visit from 09/11/2021 in [facility] Video Visit from 03/01/2021 in [facility]  Total GAD-7 Score '1 2 1 3 3      '$ [place] Office Visit from 09/23/2019 in [facility]  Total Score (max 30 points ) 27      PHQ2-9    Flowsheet Row Video Visit from 05/14/2022 in [facility] Visit from 04/17/2022 in [facility] Video Visit from 03/29/2022 in [facility] Video Visit from 12/26/2021 in [facility] Visit from 12/15/2021 in [facility]  PHQ-2 Total Score 0 1 0 0 1      Flowsheet Row Video Visit from 05/14/2022 in [facility] Video Visit from 03/29/2022 in [facility] Video Visit from 12/26/2021 in [facility] No Risk No Risk No Risk        Assessment and Plan: Maureen Randall is a 56 year old Caucasian female, married, on SSD, lives in [place], has a history of GAD, insomnia, chronic pain, osteoarthritis, degenerative disc disease, multiple surgeries with recent right shoulder total replacement, recent fall, presented for medication management.  Patient continues to be in pain however overall mood symptoms stable on the current medication regimen.  Plan as noted below.  Plan GAD-improving Cymbalta 60 mg p.o. daily BuSpar 10 mg p.o. 3 times daily Hydroxyzine 10 mg p.o. daily as needed for severe anxiety  Insomnia-restless due to pain Continue trazodone 200 mg p.o. nightly as needed Melatonin 3 mg p.o. nightly Patient will need sufficient pain  management.  Follow-up in clinic in 3 months or sooner in person.      Consent: Patient/Guardian gives verbal consent for treatment and assignment of benefits for services provided during this visit. Patient/Guardian expressed understanding and agreed to proceed.   This note was generated in part or whole with voice recognition software. Voice recognition is usually quite accurate but there are transcription errors that can and very often do occur. I apologize for any typographical errors that were not detected and corrected.      Ursula Alert, MD 05/14/2022, 3:27 PM

## 2022-05-17 ENCOUNTER — Other Ambulatory Visit (INDEPENDENT_AMBULATORY_CARE_PROVIDER_SITE_OTHER): Payer: 59

## 2022-05-17 DIAGNOSIS — E785 Hyperlipidemia, unspecified: Secondary | ICD-10-CM | POA: Diagnosis not present

## 2022-05-17 DIAGNOSIS — Z1389 Encounter for screening for other disorder: Secondary | ICD-10-CM

## 2022-05-17 DIAGNOSIS — Z1329 Encounter for screening for other suspected endocrine disorder: Secondary | ICD-10-CM | POA: Diagnosis not present

## 2022-05-17 LAB — CBC WITH DIFFERENTIAL/PLATELET
Basophils Absolute: 0.1 10*3/uL (ref 0.0–0.1)
Basophils Relative: 0.8 % (ref 0.0–3.0)
Eosinophils Absolute: 0.1 10*3/uL (ref 0.0–0.7)
Eosinophils Relative: 1.5 % (ref 0.0–5.0)
HCT: 36.2 % (ref 36.0–46.0)
Hemoglobin: 12.1 g/dL (ref 12.0–15.0)
Lymphocytes Relative: 18.4 % (ref 12.0–46.0)
Lymphs Abs: 1.2 10*3/uL (ref 0.7–4.0)
MCHC: 33.3 g/dL (ref 30.0–36.0)
MCV: 93.3 fl (ref 78.0–100.0)
Monocytes Absolute: 0.5 10*3/uL (ref 0.1–1.0)
Monocytes Relative: 6.9 % (ref 3.0–12.0)
Neutro Abs: 4.9 10*3/uL (ref 1.4–7.7)
Neutrophils Relative %: 72.4 % (ref 43.0–77.0)
Platelets: 331 10*3/uL (ref 150.0–400.0)
RBC: 3.88 Mil/uL (ref 3.87–5.11)
RDW: 13.9 % (ref 11.5–15.5)
WBC: 6.8 10*3/uL (ref 4.0–10.5)

## 2022-05-17 LAB — COMPREHENSIVE METABOLIC PANEL
ALT: 15 U/L (ref 0–35)
AST: 16 U/L (ref 0–37)
Albumin: 4.1 g/dL (ref 3.5–5.2)
Alkaline Phosphatase: 76 U/L (ref 39–117)
BUN: 23 mg/dL (ref 6–23)
CO2: 30 mEq/L (ref 19–32)
Calcium: 8.8 mg/dL (ref 8.4–10.5)
Chloride: 104 mEq/L (ref 96–112)
Creatinine, Ser: 0.65 mg/dL (ref 0.40–1.20)
GFR: 98.17 mL/min (ref >60.00)
Glucose, Bld: 94 mg/dL (ref 70–99)
Potassium: 4 mEq/L (ref 3.5–5.1)
Sodium: 140 mEq/L (ref 135–145)
Total Bilirubin: 0.5 mg/dL (ref 0.2–1.2)
Total Protein: 6.4 g/dL (ref 6.0–8.3)

## 2022-05-17 LAB — LIPID PANEL
Cholesterol: 176 mg/dL (ref 0–200)
HDL: 69.6 mg/dL (ref >39.00)
LDL Cholesterol: 87 mg/dL (ref 0–99)
NonHDL: 106.62
Total CHOL/HDL Ratio: 3
Triglycerides: 97 mg/dL (ref 0.0–149.0)
VLDL: 19.4 mg/dL (ref 0.0–40.0)

## 2022-05-17 LAB — TSH: TSH: 2.78 u[IU]/mL (ref 0.35–5.50)

## 2022-05-17 NOTE — Addendum Note (Signed)
Addended by: Neta Ehlers on: 05/17/2022 08:48 AM   Modules accepted: Orders

## 2022-05-21 LAB — URINALYSIS, ROUTINE W REFLEX MICROSCOPIC
Bilirubin Urine: NEGATIVE
Hgb urine dipstick: NEGATIVE
Ketones, ur: NEGATIVE
Leukocytes,Ua: NEGATIVE
Nitrite: NEGATIVE
RBC / HPF: NONE SEEN (ref 0–?)
Specific Gravity, Urine: >=1.03 — AB (ref 1.000–1.030)
Total Protein, Urine: NEGATIVE
Urine Glucose: NEGATIVE
Urobilinogen, UA: 0.2 (ref 0.0–1.0)
pH: 5.5 (ref 5.0–8.0)

## 2022-05-22 ENCOUNTER — Other Ambulatory Visit: Payer: Self-pay | Admitting: Neurosurgery

## 2022-05-22 DIAGNOSIS — M5030 Other cervical disc degeneration, unspecified cervical region: Secondary | ICD-10-CM

## 2022-05-25 ENCOUNTER — Other Ambulatory Visit: Payer: Self-pay | Admitting: Gastroenterology

## 2022-06-04 ENCOUNTER — Ambulatory Visit
Admission: RE | Admit: 2022-06-04 | Discharge: 2022-06-04 | Disposition: A | Discharge status: Home / Self Care | Payer: 59 | Source: Ambulatory Visit | Attending: Neurosurgery | Admitting: Neurosurgery

## 2022-06-04 DIAGNOSIS — M5030 Other cervical disc degeneration, unspecified cervical region: Secondary | ICD-10-CM

## 2022-07-11 LAB — LIPID PANEL
Chol/HDL Ratio: 2.9 ratio (ref 0.0–4.4)
Cholesterol, Total: 176 mg/dL (ref 100–199)
HDL: 61 mg/dL (ref >39)
LDL Chol Calc (NIH): 96 mg/dL (ref 0–99)
Triglycerides: 106 mg/dL (ref 0–149)
VLDL Cholesterol Cal: 19 mg/dL (ref 5–40)

## 2022-07-16 ENCOUNTER — Encounter: Payer: Self-pay | Admitting: Internal Medicine

## 2022-07-16 ENCOUNTER — Ambulatory Visit: Payer: Medicare Other | Attending: Internal Medicine | Admitting: Internal Medicine

## 2022-07-16 VITALS — BP 126/72 | HR 76 | Ht 67.0 in | Wt 216.4 lb

## 2022-07-16 DIAGNOSIS — E785 Hyperlipidemia, unspecified: Secondary | ICD-10-CM | POA: Diagnosis not present

## 2022-07-16 DIAGNOSIS — M791 Myalgia, unspecified site: Secondary | ICD-10-CM | POA: Diagnosis present

## 2022-07-16 DIAGNOSIS — T466X5A Adverse effect of antihyperlipidemic and antiarteriosclerotic drugs, initial encounter: Secondary | ICD-10-CM | POA: Diagnosis present

## 2022-07-16 MED ORDER — EZETIMIBE 10 MG PO TABS: 10.0000 mg | ORAL_TABLET | Freq: Every day | ORAL | 3 refills | Status: DC

## 2022-07-16 NOTE — Progress Notes (Signed)
LIPID CLINIC CONSULT NOTE  Chief Complaint:  Follow-up dyslipidemia  Primary Care Physician: Carollee Leitz, MD  Primary Cardiologist:  Pixie Casino, MD  HPI:  Maureen Randall is a 57 y.o. female who is being seen today for the evaluation of dyslipidemia at the request of McLean-Scocuzza, Olivia Mackie *.  This is a pleasant 57 year old female who was kindly referred for evaluation and management of dyslipidemia.  She has few cardiovascular risk factors, especially that she is not diabetic and denies any hypertension or family history of early onset heart disease.  Most recent lipids showed total cholesterol 226, triglycerides 113, HDL 68 and LDL 135.  Her PCP had felt that her targets for cholesterol may need to be lower, particularly total cholesterol below 200 or LDL below 100.  I suspect her cholesterol is partially higher because of a high HDL cholesterol which may be cardioprotective for her.  Although she has a number of other medical problems including a history of recurrent DVT on Xarelto, she does not seem to have a lot of cardiovascular risk.  She does report rather a less than ideal diet.  She says she eats a lot of cheese burgers, cheese and other saturated fat sources which could be contributing to her higher cholesterol.  Her weight is also over ideal for her height.  10/13/2021  Maureen Randall returns today for follow-up.  Fortunately she had no coronary calcium by CT back in October 2022.  Despite that her cholesterol is gone up higher with LDL now 150.  She has been less active and is required some steroid injections.  She has been having issues with back and leg pain and shoulder pain which will require apparently a shoulder replacement.  She was taking red yeast rice for short period time but did not notice a significant reduction in her cholesterol and is discontinued it.  I advised her not to take that with the statin.  We talked about the pluses and minuses of statin therapy.  Because  she had no coronary calcium that is associated with low risk, however there is some family history of high cholesterol.  She is more in favor of treating her cholesterol since it is worsened and notes that she can be less active.  I think targeting her LDL to less than 100 would be reasonable.    07/16/2022  Maureen Randall returns today for follow-up.  She has done well on ezetimibe but cannot tolerate even low-dose rosuvastatin.  Her cholesterol has come down accordingly with total now 176, triglycerides 106, HDL 61 and LDL of 96.  She denies any significant side effects with this medication.  It is free for her.  PMHx:  Past Medical History:  Diagnosis Date   Anxiety    Arthritis    osteoarthritis-joints, feet, shoulder   Asthma    previously diagnosed after a bout of bronchitis/ no problems since   Bulging of cervical intervertebral disc without myelopathy    C6-C7   Bursitis    b/l hips   Chronic pain syndrome    COPD (chronic obstructive pulmonary disease) ([place]) 2004   patient states probably bronchitis   Depression    DVT (deep venous thrombosis) ([place]) 2012   right leg. after surgery, while on BCP   GERD (gastroesophageal reflux disease)    Headache, post-myelogram 09/11/2021   Hyperlipidemia    Left leg DVT ([place])    fem/pop 12/07/20 after right shoulder surgery 11/30/20   Neuropathy    post  surgery   Pneumonia    11/2021 seen at [facility]   PONV (postoperative nausea and vomiting)    PVD (peripheral vascular disease) ([place])    Spondylolisthesis     Past Surgical History:  Procedure Laterality Date   ABLATION     veins in lower legs   BACK SURGERY  07/2014   lumbar spine/ fusion 1 level   BREAST BIOPSY Left 2011   neg   CARPAL TUNNEL RELEASE Right 12/2015   [facility]   CARPAL TUNNEL RELEASE Left 12/2016   COLONOSCOPY WITH PROPOFOL N/A 02/29/2020   Procedure: COLONOSCOPY WITH PROPOFOL;  Surgeon: [name], MD;  Location: [facility];  Service: Endoscopy;   Laterality: N/A;   ESOPHAGOGASTRODUODENOSCOPY (EGD) WITH PROPOFOL N/A 02/29/2020   Procedure: ESOPHAGOGASTRODUODENOSCOPY (EGD) WITH PROPOFOL;  Surgeon: [name], MD;  Location: [facility];  Service: Endoscopy;  Laterality: N/A;   FOOT SURGERY Right 2015   broken navicular bone   HIP ARTHROSCOPY Right 05/2011   HIP SURGERY     left hip surgery 05/2018   JOINT REPLACEMENT Bilateral 12/2017 & 05/2018   hip   L5-S1 OLIF Dr. Rennis Harding back surgery 09/2019  09/28/2019   left ganglion cyst removal     12/23/19 Dr. Elvina Mattes   MASS EXCISION Left 12/17/2019   Procedure: EXCISION TUMOR FOOT DEEP LEFT;  Surgeon: Albertine Patricia, DPM;  Location: [facility];  Service: Podiatry;  Laterality: Left;  LMA W/LOCAL   NASAL TURBINATE REDUCTION Bilateral 06/12/2019   Procedure: TURBINATE REDUCTION/SUBMUCOSAL RESECTION;  Surgeon: [name], MD;  Location: [facility];  Service: ENT;  Laterality: Bilateral;   REPLACEMENT TOTAL HIP W/  RESURFACING IMPLANTS Right 12/2017   [facility]   right shoulder surgery     11/28/20 for bone spur, torn labrum, re attached biceps tendon, tear Dr. [name]   right shoulder surgery     12/22/21 total shoulder replacement [facility] Dr. Malva Cogan CUFF REPAIR     Dr. [name] in 04/2019    SCLEROTHERAPY     veins in lower legs   SEPTOPLASTY Bilateral 06/12/2019   Procedure: SEPTOPLASTY;  Surgeon: [name], MD;  Location: [facility];  Service: ENT;  Laterality: Bilateral;   SHOULDER SURGERY Right 12/2015   [facility] torn rotator cuff   SPINAL FUSION  2016   [facility] lumbar   TOTAL SHOULDER ARTHROPLASTY Right 12/22/2021   Procedure: TOTAL SHOULDER ARTHROPLASTY;  Surgeon: Netta Cedars, MD;  Location: [facility];  Service: Orthopedics;  Laterality: Right;  with ISB    FAMHx:  Family History  Problem Relation Age of Onset   Breast cancer Maternal Aunt    Breast cancer Paternal Aunt    Dementia Paternal  Aunt    COPD Mother        quit smoking after 9 years   Arthritis Mother    Osteoarthritis Mother    Dementia Father        symptoms started in his 40s   Alzheimer's disease Father    Rheum arthritis Father    Rheum arthritis Other        FH   Dementia Other        "all my dad's side of the family"   Other Maternal Grandmother        got dementia as she got older    Dementia Paternal Grandmother     SOCHx:   reports that she has never smoked. She has never used  smokeless tobacco. She reports that she does not currently use alcohol after a past usage of about 1.0 standard drink of alcohol per week. She reports that she does not use drugs.  ALLERGIES:  Allergies  Allergen Reactions   Amoxicillin Hives   Crestor [Rosuvastatin]     Myalgia    Septra [Sulfamethoxazole-Trimethoprim] Hives        Sulfamethoxazole Hives   Trimethoprim Hives   Zanaflex [Tizanidine] Other (See Comments)    Weak muscles     Keflex [Cephalexin] Rash         ROS: Pertinent items noted in HPI and remainder of comprehensive ROS otherwise negative.  HOME MEDS: Current Outpatient Medications on File Prior to Visit  Medication Sig Dispense Refill   acetaminophen (TYLENOL) 650 MG CR tablet Take 1,300 mg by mouth every 8 (eight) hours as needed (pain/headaches.).     baclofen (LIORESAL) 10 MG tablet Take 1 tablet (10 mg total) by mouth 3 (three) times daily as needed for muscle spasms. 60 each 1   Buprenorphine HCl (BELBUCA) 450 MCG FILM Place 450 mcg inside cheek in the morning and at bedtime.     busPIRone (BUSPAR) 10 MG tablet Take 1 tablet (10 mg total) by mouth 3 (three) times daily. 270 tablet 0   cetirizine (ZYRTEC) 10 MG tablet Take 10 mg by mouth daily as needed for allergies.     dexlansoprazole (DEXILANT) 60 MG capsule TAKE 1 CAPSULE BY MOUTH EVERY DAY 90 capsule 0   diclofenac Sodium (VOLTAREN) 1 % GEL APPLY 2 GRAMS TO UPPER BODY FOUR TIMES DAILY AS NEEDED AND 4 GRAMS TO LOWER BODY FOUR  TIMES DAILY AS NEEDED 100 g 11   DULoxetine (CYMBALTA) 60 MG capsule TAKE 1 CAPSULE BY MOUTH EVERY DAY 90 capsule 1   estradiol (ESTRACE) 0.1 MG/GM vaginal cream Place 1 Applicatorful vaginally every 14 (fourteen) days.     famotidine (PEPCID) 20 MG tablet Take 1 tablet (20 mg total) by mouth 2 (two) times daily. TAKE 1 TABLET BY MOUTH EVERYDAY AT BEDTIME 180 tablet 3   fluticasone (FLONASE) 50 MCG/ACT nasal spray SHAKE LIQUID AND USE 1 TO 2 SPRAYS IN EACH NOSTRIL DAILY AS NEEDED. MAX DOSE 2 SPRAYS IN EACH NOSTRIL 16 g 11   hydrOXYzine (ATARAX) 10 MG tablet TAKE 1 TABLET BY MOUTH DAILY AS NEEDED FOR ANXIETY. 90 tablet 1   methocarbamol (ROBAXIN) 500 MG tablet Take by mouth.     naloxone (NARCAN) nasal spray 4 mg/0.1 mL      nitrofurantoin, macrocrystal-monohydrate, (MACROBID) 100 MG capsule Take 1 capsule (100 mg total) by mouth 2 (two) times daily. X 5 days for UTI 10 capsule 1   ondansetron (ZOFRAN-ODT) 8 MG disintegrating tablet Take 1 tablet (8 mg total) by mouth every 8 (eight) hours as needed for nausea or vomiting. 30 tablet 0   oxyCODONE-acetaminophen (PERCOCET) 10-325 MG tablet Take 1 tablet by mouth in the morning, at noon, in the evening, and at bedtime.     phentermine (ADIPEX-P) 37.5 MG tablet Rx 1/2 TAKE 1 TABLET(37.5 MG) BY MOUTH DAILY BEFORE BREAKFAST 60 tablet 0   pregabalin (LYRICA) 50 MG capsule Take 100 mg by mouth 2 (two) times daily.     psyllium (METAMUCIL SMOOTH TEXTURE) 28 % packet Take 1 packet by mouth every other day.     rivaroxaban (XARELTO) 10 MG TABS tablet Take 10 mg by mouth in the morning. (1000)     traZODone (DESYREL) 100 MG tablet Take 2 tablets (200  mg total) by mouth at bedtime as needed for sleep. 180 tablet 1   TURMERIC PO Take 2,000 mg by mouth in the morning. 1000 mg/capsule     valACYclovir (VALTREX) 500 MG tablet Take 500 mg by mouth 2 (two) times daily as needed (outbreaks).     No current facility-administered medications on file prior to visit.     LABS/IMAGING: No results found for this or any previous visit (from the past 48 hour(s)). No results found.  LIPID PANEL:    Component Value Date/Time   CHOL 176 07/10/2022 1400   TRIG 106 07/10/2022 1400   HDL 61 07/10/2022 1400   CHOLHDL 2.9 07/10/2022 1400   CHOLHDL 3 05/17/2022 0815   VLDL 19.4 05/17/2022 0815   LDLCALC 96 07/10/2022 1400    WEIGHTS: Wt Readings from Last 3 Encounters:  07/16/22 216 lb 6.4 oz (98.2 kg)  04/17/22 212 lb 12.8 oz (96.5 kg)  12/22/21 205 lb 9.6 oz (93.3 kg)    VITALS: BP 126/72   Pulse 76   Ht '5\' 7"'$  (1.702 m)   Wt 216 lb 6.4 oz (98.2 kg)   SpO2 99%   BMI 33.89 kg/m   EXAM: Deferred  EKG: Deferred  ASSESSMENT: Mixed dyslipidemia Atherogenic diet Zero CAC (04/2021)  PLAN: 1.   Maureen Randall has reached a target LDL less than 100 on ezetimibe.  The medication is free and well-tolerated without side effects.  Will plan to continue this and follow-up with me with a repeat lipid in 1 year or sooner as necessary.  Pixie Casino, MD, [facility], [place] Director of the Advanced Lipid Disorders &  Cardiovascular Risk Reduction Clinic Diplomate of the American Board of Clinical Lipidology Attending Cardiologist  Direct Dial: [phone]  Fax: [phone]  Website:  [email or web address]  Nadean Corwin [place] 07/16/2022, 3:24 PM

## 2022-07-16 NOTE — Patient Instructions (Signed)
Medication Instructions:  NO CHANGES  *If you need a refill on your cardiac medications before your next appointment, please call your pharmacy*   Lab Work: FASTING lipid panel to check cholesterol in 1 year  If you have labs (blood work) drawn today and your tests are completely normal, you will receive your results only by: [facility] (if you have MyChart) OR A paper copy in the mail If you have any lab test that is abnormal or we need to change your treatment, we will call you to review the results.    Follow-Up: At [facility], you and your health needs are our priority.  As part of our continuing mission to provide you with exceptional heart care, we have created designated Provider Care Teams.  These Care Teams include your primary Cardiologist (physician) and Advanced Practice Providers (APPs -  Physician Assistants and Nurse Practitioners) who all work together to provide you with the care you need, when you need it.  We recommend signing up for the patient portal called "MyChart".  Sign up information is provided on this After Visit Summary.  MyChart is used to connect with patients for Virtual Visits (Telemedicine).  Patients are able to view lab/test results, encounter notes, upcoming appointments, etc.  Non-urgent messages can be sent to your provider as well.   To learn more about what you can do with MyChart, go to [email or web address].    Your next appointment:   12 month(s)  The format for your next appointment:   In Person  Provider:   Pixie Casino, MD

## 2022-07-17 ENCOUNTER — Telehealth: Payer: Self-pay

## 2022-07-17 NOTE — Telephone Encounter (Signed)
Pt said that her insurance changed to just Medicare and they will no longer cover Dexilant... Pt is requesting an alternative to be sent in... It looks like she had been on Omeprazole prior and pt reports she took Pantoprazole before that but she does not recall if it worked....  Pt also schedule for a f/u appt in April...   Please advise

## 2022-07-25 MED ORDER — PANTOPRAZOLE SODIUM 40 MG PO TBEC: 40.0000 mg | DELAYED_RELEASE_TABLET | Freq: Every day | ORAL | 2 refills | Status: DC

## 2022-07-25 NOTE — Addendum Note (Signed)
Addended by: Lurlean Nanny on: 07/25/2022 09:08 AM   Modules accepted: Orders

## 2022-07-26 ENCOUNTER — Encounter: Payer: 59 | Admitting: Family Medicine

## 2022-08-06 ENCOUNTER — Encounter: Payer: Self-pay | Admitting: Family Medicine

## 2022-08-06 ENCOUNTER — Ambulatory Visit (INDEPENDENT_AMBULATORY_CARE_PROVIDER_SITE_OTHER): Payer: Medicare Other | Admitting: Family Medicine

## 2022-08-06 VITALS — BP 120/80 | HR 86 | Temp 98.1°F | Resp 17 | Ht 67.0 in | Wt 216.0 lb

## 2022-08-06 DIAGNOSIS — N390 Urinary tract infection, site not specified: Secondary | ICD-10-CM

## 2022-08-06 DIAGNOSIS — E785 Hyperlipidemia, unspecified: Secondary | ICD-10-CM | POA: Diagnosis not present

## 2022-08-06 DIAGNOSIS — T8332XA Displacement of intrauterine contraceptive device, initial encounter: Secondary | ICD-10-CM

## 2022-08-06 DIAGNOSIS — Z1329 Encounter for screening for other suspected endocrine disorder: Secondary | ICD-10-CM | POA: Diagnosis not present

## 2022-08-06 DIAGNOSIS — K219 Gastro-esophageal reflux disease without esophagitis: Secondary | ICD-10-CM

## 2022-08-06 DIAGNOSIS — R7309 Other abnormal glucose: Secondary | ICD-10-CM

## 2022-08-06 DIAGNOSIS — E669 Obesity, unspecified: Secondary | ICD-10-CM

## 2022-08-06 DIAGNOSIS — J329 Chronic sinusitis, unspecified: Secondary | ICD-10-CM

## 2022-08-06 DIAGNOSIS — N3000 Acute cystitis without hematuria: Secondary | ICD-10-CM

## 2022-08-06 DIAGNOSIS — I8393 Asymptomatic varicose veins of bilateral lower extremities: Secondary | ICD-10-CM

## 2022-08-06 MED ORDER — SACCHAROMYCES BOULARDII 250 MG PO CAPS: 250.0000 mg | ORAL_CAPSULE | Freq: Every day | ORAL | 0 refills | Status: DC

## 2022-08-06 NOTE — Progress Notes (Signed)
   SUBJECTIVE:   Chief Complaint  Patient presents with  . Establish Care    Transfer of Care from Dr. Ezinne Services   HPI Patient presents to clinic to transfer care  No acute concerns***  Hypertension  CAD  History of DVT  Factor V Leidin Deficiency Heterozygous    PERTINENT PMH / PSH: Hypertension Mood disorder Chronic pain Osteoarthritis   OBJECTIVE:  BP 120/80   Pulse 86   Temp 98.1 F (36.7 C) (Oral)   Resp 17   Ht '5\' 7"'$  (1.702 m)   Wt 216 lb (98 kg)   SpO2 98%   BMI 33.83 kg/m    Physical Exam  ASSESSMENT/PLAN:  Hyperlipidemia, unspecified hyperlipidemia type  Thyroid disorder screening  Acute cystitis without hematuria  Obesity (BMI 30-39.9)   PDMP reviewed***  No follow-ups on file.  Carollee Leitz, MD

## 2022-08-06 NOTE — Patient Instructions (Addendum)
It was a pleasure meeting you today. Thank you for allowing me to take part in your health care.  Our goals for today as we discussed include:  Recommend Probiotics daily while on antibiotics and continue for 2 weeks after completion of treatment  Follow up in October for annual physical Schedule fasting blood work 1 week prior to visit  If you have any questions or concerns, please do not hesitate to call the office at [phone].  I look forward to our next visit and until then take care and stay safe.  Regards,   Carollee Leitz, MD   [facility]

## 2022-08-07 ENCOUNTER — Encounter: Payer: Self-pay | Admitting: Family Medicine

## 2022-08-07 NOTE — Assessment & Plan Note (Signed)
Follows with Dr. Leafy Ro OB/GY[facility].

## 2022-08-07 NOTE — Assessment & Plan Note (Signed)
Chronic.  Recurrent sinusitis.  Currently on clindamycin therapy x 2 weeks, prednisone x 1 week Follows with Dr. Tami Ribas at [facility] Recommend probiotics while on antibiotic treatment.

## 2022-08-07 NOTE — Assessment & Plan Note (Signed)
History of chronic DVT both lower extremities. Currently on Xarelto 20 mg daily and Zetia 10 mg daily Follows with vascular surgery.

## 2022-08-07 NOTE — Assessment & Plan Note (Signed)
Chronic.  Stable. Continue Pepcid 20 mg daily Continue Protonix 40 mg daily Follows with Dr. Allen Norris

## 2022-08-07 NOTE — Assessment & Plan Note (Signed)
History of recurrent UTI since childhood.  Reports previous workup by urology in [place].  Was told had an low white blood cells in bladder and was treated with Macrobid prophylactically.  Reports last indicated use 10/23 at onset of symptoms.  Could not find any documentation in care everywhere or epic to confirm recurrent UTIs. Continue Macrobid 100 mg twice daily for recurrent UTI. Consider urology evaluation in future

## 2022-08-09 ENCOUNTER — Encounter: Payer: Self-pay | Admitting: Family Medicine

## 2022-08-09 DIAGNOSIS — Z1329 Encounter for screening for other suspected endocrine disorder: Secondary | ICD-10-CM | POA: Insufficient documentation

## 2022-08-09 DIAGNOSIS — N3000 Acute cystitis without hematuria: Secondary | ICD-10-CM | POA: Insufficient documentation

## 2022-08-09 DIAGNOSIS — R7309 Other abnormal glucose: Secondary | ICD-10-CM | POA: Insufficient documentation

## 2022-08-09 NOTE — Assessment & Plan Note (Signed)
Chronic. Stable.   On Zetia  Unable to tolerate statins Follows with Cardiology

## 2022-08-13 ENCOUNTER — Other Ambulatory Visit: Payer: Self-pay | Admitting: Neurosurgery

## 2022-08-13 ENCOUNTER — Other Ambulatory Visit: Payer: Self-pay | Admitting: Obstetrics and Gynecology

## 2022-08-13 DIAGNOSIS — Z1231 Encounter for screening mammogram for malignant neoplasm of breast: Secondary | ICD-10-CM

## 2022-08-13 LAB — HM PAP SMEAR: HM Pap smear: NORMAL

## 2022-08-16 ENCOUNTER — Ambulatory Visit (INDEPENDENT_AMBULATORY_CARE_PROVIDER_SITE_OTHER): Payer: Medicare Other | Admitting: Psychiatry

## 2022-08-16 ENCOUNTER — Encounter: Payer: Self-pay | Admitting: Psychiatry

## 2022-08-16 VITALS — BP 119/82 | HR 78 | Temp 98.1°F | Ht 67.0 in | Wt 215.4 lb

## 2022-08-16 DIAGNOSIS — G4701 Insomnia due to medical condition: Secondary | ICD-10-CM

## 2022-08-16 DIAGNOSIS — F411 Generalized anxiety disorder: Secondary | ICD-10-CM | POA: Diagnosis not present

## 2022-08-16 MED ORDER — TRAZODONE HCL 100 MG PO TABS: 200.0000 mg | ORAL_TABLET | Freq: Every evening | ORAL | 1 refills | Status: DC | PRN

## 2022-08-16 MED ORDER — DULOXETINE HCL 60 MG PO CPEP: ORAL_CAPSULE | ORAL | 1 refills | Status: DC

## 2022-08-16 NOTE — Progress Notes (Signed)
[place] MD OP Progress Note  08/16/2022 11:38 AM Maureen Randall  MRN:  [id number]  Chief Complaint:  Chief Complaint  Patient presents with   Follow-up   Anxiety   Medication Refill   Insomnia   HPI: Maureen Randall is a 57 year old Caucasian female,, married, lives in [place], on disability, has a history of GAD, insomnia, chronic pain, primary osteoarthritis, radiculopathy, degenerative disc disease, spondylolisthesis, bilateral carpal tunnel syndrome was evaluated in office today.  Patient today reports she continues to struggle with back and neck pain, may need another surgery of the neck which is coming up.  She reports she continues to take medications like oxycodone, Belbuca, Lyrica for pain management.  The patient does affect her ability to function during the day as well as sleep at night.  She has been using trazodone at night which helps her to fall asleep.  However her sleep is interrupted since she cannot find a comfortable position.  However once she is able to get comfortable again she is able to fall back asleep.  Gets around a total of 6 hours of sleep at night.  Patient reports overall mood symptoms manageable on the current dosage of Cymbalta.  Uses the BuSpar only as needed and that does help.  Denies side effects.  Patient denies any suicidality, homicidality or perceptual disturbances.  Patient appeared to be alert, oriented to person place time and situation.  Patient denies any other concerns today.  Visit Diagnosis:    ICD-10-CM   1. GAD (generalized anxiety disorder)  F41.1 traZODone (DESYREL) 100 MG tablet    2. Insomnia due to medical condition  G47.01 DULoxetine (CYMBALTA) 60 MG capsule   pain      Past Psychiatric History: Reviewed past psychiatric history from progress note on 04/30/2018.  Past Medical History:  Past Medical History:  Diagnosis Date   Adenoma of left adrenal gland 07/26/2020   Anterior to posterior tear of superior glenoid labrum of  right shoulder 11/03/2020   Anxiety    Arthralgia of multiple joints 08/31/2015   Arthritis    osteoarthritis-joints, feet, shoulder   Asthma    previously diagnosed after a bout of bronchitis/ no problems since   Biceps tendinitis 09/10/2019   Bulging of cervical intervertebral disc without myelopathy    C6-C7   Bursitis    b/l hips   Chronic pain syndrome    Closed fracture of metatarsal bone 08/06/2017   COPD (chronic obstructive pulmonary disease) ([place]) 2004   patient states probably bronchitis   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   GERD (gastroesophageal reflux disease)    H/O nasal septoplasty 09/10/2019   Headache, post-myelogram 09/11/2021   Headache, post-myelogram 09/11/2021   History of lumbar fusion 01/29/2018   History of right hip replacement (12/2017) 01/29/2018   Hyperlipidemia    Increased band cell count 08/31/2015   Infection of superficial incisional surgical site after procedure 06/07/2021   IUD threads lost 04/30/2018   Left leg DVT ([place])    fem/pop 12/07/20 after right shoulder surgery 11/30/20   Menopausal flushing 11/30/2020   Mood swings 03/15/2021   Neuropathy    post surgery   Open wound 07/04/2021  Open wound of left lower leg 06/07/2021   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 08/17/2019   Pneumonia    11/2021 [facility] at [facility]   PONV (postoperative nausea and vomiting)    Preoperative clearance 09/10/2019   PVD (peripheral vascular disease) ([place])    S/P shoulder replacement, right 12/22/2021   Special screening for malignant neoplasms, colon    Spondylolisthesis    Strain of foot 01/17/2017   Venous  insufficiency of both lower extremities 10/20/2018    Past Surgical History:  Procedure Laterality Date   ABLATION     veins in lower legs   BACK SURGERY  07/2014   lumbar spine/ fusion 1 level   BREAST BIOPSY Left 2011   neg   CARPAL TUNNEL RELEASE Right 12/2015   [facility]   CARPAL TUNNEL RELEASE Left 12/2016   COLONOSCOPY WITH PROPOFOL N/A 02/29/2020   Procedure: COLONOSCOPY WITH PROPOFOL;  Surgeon: [name], MD;  Location: [facility];  Service: Endoscopy;  Laterality: N/A;   ESOPHAGOGASTRODUODENOSCOPY (EGD) WITH PROPOFOL N/A 02/29/2020   Procedure: ESOPHAGOGASTRODUODENOSCOPY (EGD) WITH PROPOFOL;  Surgeon: [name], MD;  Location: [facility];  Service: Endoscopy;  Laterality: N/A;   FOOT SURGERY Right 2015   broken navicular bone   HIP ARTHROSCOPY Right 05/2011   HIP SURGERY     left hip surgery 05/2018   JOINT REPLACEMENT Bilateral 12/2017 & 05/2018   hip   L5-S1 OLIF Dr. Rennis Harding back surgery 09/2019  09/28/2019   left ganglion cyst removal     12/23/19 Dr. Elvina Mattes   MASS EXCISION Left 12/17/2019   Procedure: EXCISION TUMOR FOOT DEEP LEFT;  Surgeon: Albertine Patricia, DPM;  Location: [facility];  Service: Podiatry;  Laterality: Left;  LMA W/LOCAL   NASAL TURBINATE REDUCTION Bilateral 06/12/2019   Procedure: TURBINATE REDUCTION/SUBMUCOSAL RESECTION;  Surgeon: [name], MD;  Location: [facility];  Service: ENT;  Laterality: Bilateral;   REPLACEMENT TOTAL HIP W/  RESURFACING IMPLANTS Right 12/2017   [facility]   right shoulder surgery     11/28/20 for bone spur, torn labrum, re attached biceps tendon, tear Dr. [name]   right shoulder surgery     12/22/21 total shoulder replacement [facility] Dr. Malva Cogan CUFF REPAIR     Dr. [name] in 04/2019    SCLEROTHERAPY     veins in lower legs   SEPTOPLASTY Bilateral 06/12/2019   Procedure: SEPTOPLASTY;  Surgeon: [name], MD;  Location: [facility];  Service: ENT;  Laterality: Bilateral;   SHOULDER SURGERY Right 12/2015   [facility] torn rotator cuff   SPINAL FUSION  2016   [facility] lumbar   TOTAL SHOULDER ARTHROPLASTY Right 12/22/2021   Procedure: TOTAL SHOULDER ARTHROPLASTY;  Surgeon: Netta Cedars, MD;  Location: [facility];  Service: Orthopedics;  Laterality: Right;  with ISB    Family Psychiatric History: Reviewed family psychiatric history from progress note on 04/30/2018.  Family History:  Family History  Problem Relation Age of Onset   Breast cancer Maternal Aunt    Breast cancer Paternal Aunt    Dementia Paternal Aunt    COPD Mother        quit smoking after 16 years   Arthritis Mother    Osteoarthritis Mother    Dementia Father        symptoms started in his 39s   Alzheimer's disease Father    Rheum arthritis Father    Rheum arthritis  Other        FH   Dementia Other        "all my dad's side of the family"   Other Maternal Grandmother        got dementia as she got older    Dementia Paternal Grandmother     Social History: Reviewed social history from progress note on 04/30/2018. Social History   Socioeconomic History   Marital status: Married    Spouse name: fredrick   Number of children: 1   Years of education: Not on file   Highest education level: High school graduate  Occupational History    Comment: full time  Tobacco Use   Smoking status: Never   Smokeless tobacco: Never  Vaping Use   Vaping Use: Never used  Substance and Sexual Activity   Alcohol use: Not Currently    Alcohol/week: 1.0 standard drink of alcohol    Types: 1 Cans of beer per week    Comment: may have drink 1x/month   Drug use: Never   Sexual activity: Yes  Other Topics Concern   Not on file  Social History Narrative   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    Married 1 child    Lives at home with her husband   Right handed   Caffeine: maybe 1 cup/day (pepsi)   Social Determinants of Health    Financial Resource Strain: Medium Risk (04/30/2018)   Overall Financial Resource Strain (CARDIA)    Difficulty of Paying Living Expenses: Somewhat hard  Food Insecurity: Food Insecurity Present (04/30/2018)   Hunger Vital Sign    Worried About [place] in the Last Year: Sometimes true    Ran Out of Food in the Last Year: Sometimes true  Transportation Needs: No Transportation Needs (04/30/2018)   PRAPARE - Hydrologist (Medical): No    Lack of Transportation (Non-Medical): No  Physical Activity: Inactive (04/30/2018)   Exercise Vital Sign    Days of Exercise per Week: 0 days    Minutes of Exercise per Session: 0 min  Stress: Stress Concern Present (04/30/2018)   [place]    Feeling of Stress : Very much  Social Connections: Unknown (04/30/2018)   Social Connection and Isolation Panel [NHANES]    Frequency of Communication with Friends and Family: Not on file    Frequency of Social Gatherings with Friends and Family: Not on file    Attends Religious Services: Never    Active Member of Clubs or Organizations: No    Attends Archivist Meetings: Never    Marital Status: Married    Allergies:  Allergies  Allergen Reactions   Amoxicillin Hives   Crestor [Rosuvastatin]     Myalgia    Septra [Sulfamethoxazole-Trimethoprim] Hives        Sulfamethoxazole Hives   Trimethoprim Hives   Zanaflex [Tizanidine] Other (See Comments)    Weak muscles     Keflex [Cephalexin] Rash         Metabolic Disorder Labs: Lab Results  Component Value Date   HGBA1C 5.5 12/14/2021   MPG 111.15 12/14/2021   No results found for: "PROLACTIN" Lab Results  Component Value Date   CHOL 176 07/10/2022   TRIG 106 07/10/2022   HDL 61 07/10/2022   CHOLHDL 2.9 07/10/2022   VLDL 19.4 05/17/2022   LDLCALC 96 07/10/2022   LDLCALC 87 05/17/2022   Lab Results  Component  Value Date    TSH 2.78 05/17/2022   TSH 1.49 09/22/2020    Therapeutic Level Labs: No results found for: "LITHIUM" No results found for: "VALPROATE" No results found for: "CBMZ"  Current Medications: Current Outpatient Medications  Medication Sig Dispense Refill   acetaminophen (TYLENOL) 650 MG CR tablet Take 650 mg by mouth every 8 (eight) hours as needed (pain/headaches.).     baclofen (LIORESAL) 10 MG tablet Take 1 tablet (10 mg total) by mouth 3 (three) times daily as needed for muscle spasms. 60 each 1   Buprenorphine HCl (BELBUCA) 450 MCG FILM Place 450 mcg inside cheek in the morning and at bedtime.     busPIRone (BUSPAR) 10 MG tablet Take 1 tablet (10 mg total) by mouth 3 (three) times daily. 270 tablet 0   cetirizine (ZYRTEC) 10 MG tablet Take 10 mg by mouth daily as needed for allergies.     clindamycin (CLEOCIN) 300 MG capsule Take 300 mg by mouth 3 (three) times daily.     diclofenac Sodium (VOLTAREN) 1 % GEL APPLY 2 GRAMS TO UPPER BODY FOUR TIMES DAILY AS NEEDED AND 4 GRAMS TO LOWER BODY FOUR TIMES DAILY AS NEEDED 100 g 11   estradiol (ESTRACE) 0.1 MG/GM vaginal cream Place 1 Applicatorful vaginally every 14 (fourteen) days.     ezetimibe (ZETIA) 10 MG tablet Take 1 tablet (10 mg total) by mouth daily. 90 tablet 3   famotidine (PEPCID) 20 MG tablet Take 1 tablet (20 mg total) by mouth 2 (two) times daily. TAKE 1 TABLET BY MOUTH EVERYDAY AT BEDTIME 180 tablet 3   fluconazole (DIFLUCAN) 200 MG tablet Take 200 mg by mouth daily.     fluticasone (FLONASE) 50 MCG/ACT nasal spray SHAKE LIQUID AND USE 1 TO 2 SPRAYS IN EACH NOSTRIL DAILY AS NEEDED. MAX DOSE 2 SPRAYS IN EACH NOSTRIL 16 g 11   hydrOXYzine (ATARAX) 10 MG tablet TAKE 1 TABLET BY MOUTH DAILY AS NEEDED FOR ANXIETY. 90 tablet 1   methocarbamol (ROBAXIN) 500 MG tablet Take by mouth.     naloxone (NARCAN) nasal spray 4 mg/0.1 mL      nitrofurantoin, macrocrystal-monohydrate, (MACROBID) 100 MG capsule Take 1 capsule (100 mg total) by  mouth 2 (two) times daily. X 5 days for UTI 10 capsule 1   ondansetron (ZOFRAN-ODT) 8 MG disintegrating tablet Take 1 tablet (8 mg total) by mouth every 8 (eight) hours as needed for nausea or vomiting. 30 tablet 0   oxyCODONE-acetaminophen (PERCOCET) 10-325 MG tablet Take 1 tablet by mouth in the morning, at noon, in the evening, and at bedtime.     pantoprazole (PROTONIX) 40 MG tablet Take 1 tablet (40 mg total) by mouth daily. 30 tablet 2   pregabalin (LYRICA) 50 MG capsule Take 100 mg by mouth 2 (two) times daily.     psyllium (METAMUCIL SMOOTH TEXTURE) 28 % packet Take 1 packet by mouth every other day.     rivaroxaban (XARELTO) 10 MG TABS tablet Take 10 mg by mouth in the morning. (1000)     saccharomyces boulardii (FLORASTOR) 250 MG capsule Take 1 capsule (250 mg total) by mouth daily. 90 capsule 0   TURMERIC PO Take 2,000 mg by mouth in the morning. 1000 mg/capsule     valACYclovir (VALTREX) 500 MG tablet Take 500 mg by mouth 2 (two) times daily as needed (outbreaks).     DULoxetine (CYMBALTA) 60 MG capsule TAKE 1 CAPSULE BY MOUTH EVERY DAY 90 capsule 1   predniSONE (  DELTASONE) 20 MG tablet Take 20 mg by mouth. (Patient not taking: Reported on 08/16/2022)     traZODone (DESYREL) 100 MG tablet Take 2 tablets (200 mg total) by mouth at bedtime as needed for sleep. 180 tablet 1   No current facility-administered medications for this visit.     Musculoskeletal: Strength & Muscle Tone: within normal limits Gait & Station: normal Patient leans: N/A  Psychiatric Specialty Exam: Review of Systems  Musculoskeletal:  Positive for arthralgias, back pain and neck pain.  Psychiatric/Behavioral:  The patient is nervous/anxious.   All other systems reviewed and are negative.   Blood pressure 119/82, pulse 78, temperature 98.1 F (36.7 C), temperature source Skin, height '5\' 7"'$  (1.702 m), weight 215 lb 6.4 oz (97.7 kg).Body mass index is 33.74 kg/m.  General Appearance: Casual  Eye Contact:   Fair  Speech:  Clear and Coherent  Volume:  Normal  Mood:  Anxious  Affect:  Congruent  Thought Process:  Goal Directed and Descriptions of Associations: Intact  Orientation:  Full (Time, Place, and Person)  Thought Content: Logical   Suicidal Thoughts:  No  Homicidal Thoughts:  No  Memory:  Immediate;   Fair Recent;   Fair Remote;   Fair  Judgement:  Fair  Insight:  Fair  Psychomotor Activity:  Normal  Concentration:  Concentration: Fair and Attention Span: Fair  Recall:  [employer] of Knowledge: Fair  Language: Fair  Akathisia:  No  Handed:  Right  AIMS (if indicated): not done  Assets:  Communication Skills Desire for Improvement Housing Resilience Social Support  ADL's:  Intact  Cognition: WNL  Sleep:  Fair   Screenings: AIMS    Flowsheet Row Video Visit from 12/26/2021 in [facility] Total Score 0      [place] Office Visit from 08/16/2022 in [place] Office Visit from 08/06/2022 in [place] at [facility] Video Visit from 05/14/2022 in [place] Video Visit from 03/29/2022 in [place] Video Visit from 12/26/2021 in [facility]  Total GAD-7 Score '3 3 1 2 1      '$ [place] Office Visit from 09/23/2019 in [facility]  Total Score (max 30 points ) 27      PHQ2-9    [place] Visit from 08/16/2022 in [place] Office Visit from 08/06/2022 in [place] at [facility] Video Visit from 05/14/2022 in [place] Office Visit from 04/17/2022 in [place] at [facility] Video Visit from 03/29/2022 in [place]  PHQ-2 Total Score 1 2 0 1 0  PHQ-9 Total Score 3 4 -- -- --      [place] Office Visit from 08/16/2022 in [place] Video Visit from 05/14/2022 in [place] Video Visit from 03/29/2022 in [place] No Risk No Risk No Risk        Assessment and Plan: Maureen Randall is a 57 year old Caucasian female, married, on SSD, lives in [place], has a history of GAD, insomnia, chronic pain, multiple other medical problems was evaluated in office today.  Patient is currently struggling with pain although mood  symptoms are managed on the current medication regimen.  Plan as noted below.  Plan  GAD-stable Cymbalta 60 mg p.o. daily BuSpar 10 mg p.o. 3 times daily, patient is currently using it as needed Hydroxyzine 10 mg p.o. daily as needed for severe anxiety  Insomnia-restless due to pain Trazodone 200 mg p.o. nightly as needed Melatonin 3 mg p.o. nightly She will need sufficient pain management  Follow-up in clinic in 5 to 6 months or sooner if needed. This note was generated in part or whole with voice recognition software. Voice recognition is usually quite accurate but there are transcription errors that can and very often do occur. I apologize for any typographical errors that were not detected and corrected.     Ursula Alert, MD 08/16/2022, 11:38 AM

## 2022-08-29 NOTE — Progress Notes (Signed)
Surgical Instructions    Your procedure is scheduled on Wednesday, September 05, 2022.  Report to [facility] Main Entrance "A" at 7:30 A.M., then check in with the Admitting office.  Call this number if you have problems the morning of surgery:  [phone]   If you have any questions prior to your surgery date call [phone]: Open Monday-Friday 8am-4pm If you experience any cold or flu symptoms such as cough, fever, chills, shortness of breath, etc. between now and your scheduled surgery, please notify us at the above number     Remember:  Do not eat after midnight the night before your surgery  You may drink clear liquids until 6:30 the morning of your surgery.   Clear liquids allowed are: Water, Non-Citrus Juices (without pulp), Carbonated Beverages, Clear Tea, Black Coffee ONLY (NO MILK, CREAM OR POWDERED CREAMER of any kind), and Gatorade    Take these medicines the morning of surgery with A SIP OF WATER:  (MACROBID)  ondansetron (ZOFRAN-ODT)  oxyCODONE-acetaminophen (PERCOCET)  pantoprazole (PROTONIX)  DULoxetine (CYMBALTA)  ezetimibe (ZETIA)  hydrOXYzine (ATARAX)   If needed:  acetaminophen (TYLENOL)  baclofen (LIORESAL)  busPIRone (BUSPAR)  cetirizine (ZYRTEC)  fluticasone (FLONASE)  methocarbamol (ROBAXIN)  valACYclovir (VALTREX)   As of today, STOP taking any Aspirin (unless otherwise instructed by your surgeon) Aleve, Naproxen, Ibuprofen, Motrin, Advil, Goody's, BC's, all herbal medications, fish oil, and all vitamins.  Special instructions:    Oral Hygiene is also important to reduce your risk of infection.  Remember - BRUSH YOUR TEETH THE MORNING OF SURGERY WITH YOUR REGULAR TOOTHPASTE   [place]- Preparing For Surgery  Before surgery, you can play an important role. Because skin is not sterile, your skin needs to be as free of germs as possible. You can reduce the number of germs on your skin by washing with CHG (chlorahexidine gluconate) Soap before  surgery.  CHG is an antiseptic cleaner which kills germs and bonds with the skin to continue killing germs even after washing.     Please do not use if you have an allergy to CHG or antibacterial soaps. If your skin becomes reddened/irritated stop using the CHG.  Do not shave (including legs and underarms) for at least 48 hours prior to first CHG shower. It is OK to shave your face.  Please follow these instructions carefully.     Shower the NIGHT BEFORE SURGERY and the MORNING OF SURGERY with CHG Soap.   If you chose to wash your hair, wash your hair first as usual with your normal shampoo. After you shampoo, rinse your hair and body thoroughly to remove the shampoo.  Then [employer] and genitals (private parts) with your normal soap and rinse thoroughly to remove soap.  After that Use CHG Soap as you would any other liquid soap. You can apply CHG directly to the skin and wash gently with a scrungie or a clean washcloth.   Apply the CHG Soap to your body ONLY FROM THE NECK DOWN.  Do not use on open wounds or open sores. Avoid contact with your eyes, ears, mouth and genitals (private parts). Wash Face and genitals (private parts)  with your normal soap.   Wash thoroughly, paying special attention to the area where your surgery will be performed.  Thoroughly rinse your body with warm water from the neck down.  DO NOT shower/wash with your normal soap after using and rinsing off the CHG Soap.  Pat yourself dry with a CLEAN TOWEL.  Wear CLEAN PAJAMAS to bed the night before surgery  Place CLEAN SHEETS on your bed the night before your surgery  DO NOT SLEEP WITH PETS.   Day of Surgery:  Take a shower with CHG soap. Wear Clean/Comfortable clothing the morning of surgery Do not apply any deodorants/lotions.   Remember to brush your teeth WITH YOUR REGULAR TOOTHPASTE.    Do not wear jewelry or makeup. Do not wear lotions, powders, perfumes/cologne or deodorant. Do not shave 48 hours  prior to surgery.  Men may shave face and neck. Do not bring valuables to the hospital. Do not wear nail polish, gel polish, artificial nails, or any other type of covering on natural nails (fingers and toes) If you have artificial nails or gel coating that need to be removed by a nail salon, please have this removed prior to surgery. Artificial nails or gel coating may interfere with anesthesia's ability to adequately monitor your vital signs.  [facility] is not responsible for any belongings or valuables.    Do NOT Smoke (Tobacco/Vaping)  24 hours prior to your procedure  If you use a CPAP at night, you may bring your mask for your overnight stay.   Contacts, glasses, hearing aids, dentures or partials may not be worn into surgery, please bring cases for these belongings   For patients admitted to the hospital, discharge time will be determined by your treatment team.   Patients discharged the day of surgery will not be allowed to drive home, and someone needs to stay with them for 24 hours.   SURGICAL WAITING ROOM VISITATION Patients having surgery or a procedure may have no more than 2 support people in the waiting area - these visitors may rotate.   Children under the age of 29 must have an adult with them who is not the patient. If the patient needs to stay at the hospital during part of their recovery, the visitor guidelines for inpatient rooms apply. Pre-op nurse will coordinate an appropriate time for 1 support person to accompany patient in pre-op.  This support person may not rotate.   Please refer to [email or web address] for the visitor guidelines for Inpatients (after your surgery is over and you are in a regular room).     If you received a COVID test during your pre-op visit, it is requested that you wear a mask when out in public, stay away from anyone that may not be feeling well, and notify your surgeon if you develop  symptoms. If you have been in contact with anyone that has tested positive in the last 10 days, please notify your surgeon.    Please read over the following fact sheets that you were given.

## 2022-08-30 ENCOUNTER — Encounter (HOSPITAL_COMMUNITY): Payer: Self-pay

## 2022-08-30 ENCOUNTER — Encounter (HOSPITAL_COMMUNITY)
Admission: RE | Admit: 2022-08-30 | Discharge: 2022-08-30 | Disposition: A | Discharge status: Home / Self Care | Payer: Medicare Other | Source: Ambulatory Visit | Attending: Neurosurgery | Admitting: Neurosurgery

## 2022-08-30 ENCOUNTER — Other Ambulatory Visit: Payer: Self-pay

## 2022-08-30 VITALS — BP 111/81 | HR 72 | Temp 98.1°F | Resp 18 | Ht 67.0 in | Wt 215.0 lb

## 2022-08-30 DIAGNOSIS — E669 Obesity, unspecified: Secondary | ICD-10-CM | POA: Insufficient documentation

## 2022-08-30 DIAGNOSIS — Z01812 Encounter for preprocedural laboratory examination: Secondary | ICD-10-CM | POA: Insufficient documentation

## 2022-08-30 DIAGNOSIS — Z6830 Body mass index (BMI) 30.0-30.9, adult: Secondary | ICD-10-CM | POA: Insufficient documentation

## 2022-08-30 DIAGNOSIS — Z01818 Encounter for other preprocedural examination: Secondary | ICD-10-CM

## 2022-08-30 LAB — TYPE AND SCREEN
ABO/RH(D): B POS
Antibody Screen: NEGATIVE

## 2022-08-30 LAB — BASIC METABOLIC PANEL
Anion gap: 4 — ABNORMAL LOW (ref 5–15)
BUN: 14 mg/dL (ref 6–20)
CO2: 27 mmol/L (ref 22–32)
Calcium: 8.9 mg/dL (ref 8.9–10.3)
Chloride: 107 mmol/L (ref 98–111)
Creatinine, Ser: 0.8 mg/dL (ref 0.44–1.00)
GFR, Estimated: >60 mL/min (ref >60)
Glucose, Bld: 111 mg/dL — ABNORMAL HIGH (ref 70–99)
Potassium: 4.3 mmol/L (ref 3.5–5.1)
Sodium: 138 mmol/L (ref 135–145)

## 2022-08-30 LAB — SURGICAL PCR SCREEN
MRSA, PCR: NEGATIVE
Staphylococcus aureus: NEGATIVE

## 2022-08-30 LAB — CBC
HCT: 39.7 % (ref 36.0–46.0)
Hemoglobin: 13.2 g/dL (ref 12.0–15.0)
MCH: 31.8 pg (ref 26.0–34.0)
MCHC: 33.2 g/dL (ref 30.0–36.0)
MCV: 95.7 fL (ref 80.0–100.0)
Platelets: 346 10*3/uL (ref 150–400)
RBC: 4.15 MIL/uL (ref 3.87–5.11)
RDW: 13.8 % (ref 11.5–15.5)
WBC: 5.3 10*3/uL (ref 4.0–10.5)
nRBC: 0 % (ref 0.0–0.2)

## 2022-08-30 NOTE — Progress Notes (Signed)
PCP - Dr. Carollee Leitz Cardiologist - Dr. Lyman Bishop  PPM/ICD - Denies  Chest x-ray - 01/26/2022  EKG - 12/07/2020 Stress Test - Denies ECHO - 01/20/2019 Cardiac Cath - Denies  Sleep Study - Denies CPAP - Denies  Non-diabetic Last dose of GLP1 agonist-  Non-diabetic  Blood Thinner Instructions: Per surgeon, stop Zaralto 3 days prior to surgery and continuing until 2 days after Aspirin Instructions:Denies  ERAS Protcol - yes, clear liquids until 3 hours before surgery PRE-SURGERY Ensure or G2- No  COVID TEST- Denies  Anesthesia review: No  Patient denies shortness of breath, fever, cough and chest pain at PAT appointment   All instructions explained to the patient, with a verbal understanding of the material. Patient agrees to go over the instructions while at home for a better understanding. Patient also instructed to self quarantine after being tested for COVID-19. The opportunity to ask questions was provided.

## 2022-09-04 NOTE — Anesthesia Preprocedure Evaluation (Addendum)
Anesthesia Evaluation  Patient identified by MRN, date of birth, ID band Patient awake    Reviewed: Allergy & Precautions, NPO status , Patient's Chart, lab work & pertinent test results  History of Anesthesia Complications (+) PONV and history of anesthetic complications  Airway Mallampati: I  TM Distance: >3 FB Neck ROM: Full    Dental no notable dental hx. (+) Dental Advisory Given, Teeth Intact   Pulmonary asthma , pneumonia, COPD   Pulmonary exam normal breath sounds clear to auscultation       Cardiovascular + Peripheral Vascular Disease and + DVT  Normal cardiovascular exam Rhythm:Regular Rate:Normal  01/2019 Echo 1. The left ventricle has normal systolic function, with an ejection  fraction of 55-60%. The cavity size was normal. Left ventricular diastolic  parameters were normal. No evidence of left ventricular regional wall  motion abnormalities.  2. The right ventricle has normal systolic function. The cavity was  normal. There is no increase in right ventricular wall thickness. Right  ventricular systolic pressure could not be assessed.  3. Trivial pericardial effusion is present.  4. The mitral valve is grossly normal.  5. The aortic valve was not well visualized.  6. The aortic root is normal in size and structure.  7. The interatrial septum was not well visualized.    Neuro/Psych  Headaches PSYCHIATRIC DISORDERS Anxiety Depression       GI/Hepatic hiatal hernia,GERD  ,,  Endo/Other    Renal/GU      Musculoskeletal  (+) Arthritis ,    Abdominal  (+) + obese (BMI:33.2)  Peds  Hematology Lab Results      Component                Value               Date                        HGB                      13.1                12/14/2021                HCT                      40.0                12/14/2021               PLT                      322                 12/14/2021              Anesthesia  Other Findings All: sulfa, amoxicillin, keflex, crestor  Reproductive/Obstetrics                             Anesthesia Physical Anesthesia Plan  ASA: 2  Anesthesia Plan: General   Post-op Pain Management: Precedex, Tylenol PO (pre-op)* and Gabapentin PO (pre-op)*   Induction: Intravenous  PONV Risk Score and Plan: 4 or greater and Midazolam, Ondansetron, Treatment may vary due to age or medical condition and Dexamethasone  Airway Management Planned: Oral ETT  Additional Equipment: None  Intra-op Plan:  Post-operative Plan: Extubation in OR  Informed Consent: I have reviewed the patients History and Physical, chart, labs and discussed the procedure including the risks, benefits and alternatives for the proposed anesthesia with the patient or authorized representative who has indicated his/her understanding and acceptance.     Dental advisory given  Plan Discussed with: CRNA  Anesthesia Plan Comments:         Anesthesia Quick Evaluation

## 2022-09-05 ENCOUNTER — Ambulatory Visit (HOSPITAL_COMMUNITY)
Admission: RE | Admit: 2022-09-05 | Discharge: 2022-09-05 | Disposition: A | Discharge status: Home / Self Care | Payer: Medicare Other | Source: Ambulatory Visit | Attending: Neurosurgery | Admitting: Neurosurgery

## 2022-09-05 ENCOUNTER — Other Ambulatory Visit: Payer: Self-pay

## 2022-09-05 ENCOUNTER — Ambulatory Visit (HOSPITAL_COMMUNITY): Payer: Medicare Other

## 2022-09-05 ENCOUNTER — Ambulatory Visit (HOSPITAL_COMMUNITY): Payer: Medicare Other | Admitting: Anesthesiology

## 2022-09-05 ENCOUNTER — Ambulatory Visit (HOSPITAL_COMMUNITY)
Admission: RE | Disposition: A | Discharge status: Home / Self Care | Payer: Self-pay | Source: Ambulatory Visit | Attending: Neurosurgery

## 2022-09-05 ENCOUNTER — Ambulatory Visit (HOSPITAL_BASED_OUTPATIENT_CLINIC_OR_DEPARTMENT_OTHER): Payer: Medicare Other | Admitting: Anesthesiology

## 2022-09-05 ENCOUNTER — Encounter (HOSPITAL_COMMUNITY): Payer: Self-pay | Admitting: Neurosurgery

## 2022-09-05 DIAGNOSIS — Z96643 Presence of artificial hip joint, bilateral: Secondary | ICD-10-CM | POA: Diagnosis not present

## 2022-09-05 DIAGNOSIS — M50123 Cervical disc disorder at C6-C7 level with radiculopathy: Secondary | ICD-10-CM | POA: Diagnosis present

## 2022-09-05 DIAGNOSIS — F419 Anxiety disorder, unspecified: Secondary | ICD-10-CM | POA: Insufficient documentation

## 2022-09-05 DIAGNOSIS — K449 Diaphragmatic hernia without obstruction or gangrene: Secondary | ICD-10-CM | POA: Diagnosis not present

## 2022-09-05 DIAGNOSIS — Z86718 Personal history of other venous thrombosis and embolism: Secondary | ICD-10-CM | POA: Diagnosis not present

## 2022-09-05 DIAGNOSIS — K219 Gastro-esophageal reflux disease without esophagitis: Secondary | ICD-10-CM | POA: Insufficient documentation

## 2022-09-05 DIAGNOSIS — F32A Depression, unspecified: Secondary | ICD-10-CM | POA: Diagnosis not present

## 2022-09-05 DIAGNOSIS — Z981 Arthrodesis status: Secondary | ICD-10-CM | POA: Diagnosis not present

## 2022-09-05 DIAGNOSIS — Z9889 Other specified postprocedural states: Secondary | ICD-10-CM | POA: Insufficient documentation

## 2022-09-05 DIAGNOSIS — J449 Chronic obstructive pulmonary disease, unspecified: Secondary | ICD-10-CM | POA: Diagnosis not present

## 2022-09-05 DIAGNOSIS — M1990 Unspecified osteoarthritis, unspecified site: Secondary | ICD-10-CM | POA: Diagnosis not present

## 2022-09-05 DIAGNOSIS — M5412 Radiculopathy, cervical region: Secondary | ICD-10-CM

## 2022-09-05 HISTORY — PX: CERVICAL DISC ARTHROPLASTY: SHX587

## 2022-09-05 LAB — ABO/RH: ABO/RH(D): B POS

## 2022-09-05 SURGERY — CERVICAL ANTERIOR DISC ARTHROPLASTY
Anesthesia: General

## 2022-09-05 MED ORDER — FENTANYL CITRATE (PF) 250 MCG/5ML IJ SOLN
INTRAMUSCULAR | Status: DC | PRN
  Administered 2022-09-05 (×2): 100 ug via INTRAVENOUS

## 2022-09-05 MED ORDER — ROCURONIUM BROMIDE 10 MG/ML (PF) SYRINGE
PREFILLED_SYRINGE | INTRAVENOUS | Status: AC
  Filled 2022-09-05: qty 10

## 2022-09-05 MED ORDER — LIDOCAINE-EPINEPHRINE 0.5 %-1:200000 IJ SOLN
INTRAMUSCULAR | Status: AC
  Filled 2022-09-05: qty 50

## 2022-09-05 MED ORDER — AMISULPRIDE (ANTIEMETIC) 5 MG/2ML IV SOLN: 10.0000 mg | Freq: Once | INTRAVENOUS | Status: DC | PRN

## 2022-09-05 MED ORDER — MIDAZOLAM HCL 2 MG/2ML IJ SOLN
INTRAMUSCULAR | Status: DC | PRN
  Administered 2022-09-05: 2 mg via INTRAVENOUS

## 2022-09-05 MED ORDER — PROMETHAZINE HCL 25 MG/ML IJ SOLN: 6.2500 mg | INTRAMUSCULAR | Status: DC | PRN

## 2022-09-05 MED ORDER — HYDROMORPHONE HCL 1 MG/ML IJ SOLN
INTRAMUSCULAR | Status: AC
  Administered 2022-09-05: 0.5 mg via INTRAVENOUS
  Filled 2022-09-05: qty 1

## 2022-09-05 MED ORDER — GABAPENTIN 300 MG PO CAPS: 300.0000 mg | ORAL_CAPSULE | Freq: Once | ORAL | Status: DC

## 2022-09-05 MED ORDER — 0.9 % SODIUM CHLORIDE (POUR BTL) OPTIME
TOPICAL | Status: DC | PRN
  Administered 2022-09-05: 1000 mL

## 2022-09-05 MED ORDER — CHLORHEXIDINE GLUCONATE CLOTH 2 % EX PADS: 6.0000 | MEDICATED_PAD | Freq: Once | CUTANEOUS | Status: DC

## 2022-09-05 MED ORDER — PROPOFOL 10 MG/ML IV BOLUS
INTRAVENOUS | Status: AC
  Filled 2022-09-05: qty 20

## 2022-09-05 MED ORDER — THROMBIN 5000 UNITS EX SOLR
CUTANEOUS | Status: AC
  Filled 2022-09-05: qty 10000

## 2022-09-05 MED ORDER — LACTATED RINGERS IV SOLN: INTRAVENOUS | Status: DC

## 2022-09-05 MED ORDER — HYDROMORPHONE HCL 1 MG/ML IJ SOLN
0.2500 mg | INTRAMUSCULAR | Status: DC | PRN
  Administered 2022-09-05 (×3): 0.5 mg via INTRAVENOUS

## 2022-09-05 MED ORDER — FENTANYL CITRATE (PF) 250 MCG/5ML IJ SOLN
INTRAMUSCULAR | Status: AC
  Filled 2022-09-05: qty 5

## 2022-09-05 MED ORDER — THROMBIN (RECOMBINANT) 5000 UNITS EX SOLR: CUTANEOUS | Status: DC | PRN

## 2022-09-05 MED ORDER — LIDOCAINE-EPINEPHRINE 0.5 %-1:200000 IJ SOLN
INTRAMUSCULAR | Status: DC | PRN
  Administered 2022-09-05: 6 mL

## 2022-09-05 MED ORDER — ACETAMINOPHEN 500 MG PO TABS
1000.0000 mg | ORAL_TABLET | Freq: Once | ORAL | Status: AC
  Administered 2022-09-05: 1000 mg via ORAL
  Filled 2022-09-05: qty 2

## 2022-09-05 MED ORDER — DEXAMETHASONE SODIUM PHOSPHATE 10 MG/ML IJ SOLN
INTRAMUSCULAR | Status: DC | PRN
  Administered 2022-09-05: 10 mg via INTRAVENOUS

## 2022-09-05 MED ORDER — PROPOFOL 10 MG/ML IV BOLUS
INTRAVENOUS | Status: DC | PRN
  Administered 2022-09-05: 200 mg via INTRAVENOUS
  Administered 2022-09-05: 25 ug/kg/min via INTRAVENOUS

## 2022-09-05 MED ORDER — PHENYLEPHRINE HCL-NACL 20-0.9 MG/250ML-% IV SOLN
INTRAVENOUS | Status: DC | PRN
  Administered 2022-09-05: 25 ug/min via INTRAVENOUS

## 2022-09-05 MED ORDER — ONDANSETRON HCL 4 MG/2ML IJ SOLN
INTRAMUSCULAR | Status: DC | PRN
  Administered 2022-09-05: 4 mg via INTRAVENOUS

## 2022-09-05 MED ORDER — ROCURONIUM BROMIDE 10 MG/ML (PF) SYRINGE
PREFILLED_SYRINGE | INTRAVENOUS | Status: DC | PRN
  Administered 2022-09-05 (×2): 10 mg via INTRAVENOUS
  Administered 2022-09-05: 60 mg via INTRAVENOUS

## 2022-09-05 MED ORDER — SODIUM CHLORIDE 0.9 % IV SOLN: INTRAVENOUS | Status: DC | PRN

## 2022-09-05 MED ORDER — ORAL CARE MOUTH RINSE: 15.0000 mL | Freq: Once | OROMUCOSAL | Status: AC

## 2022-09-05 MED ORDER — LIDOCAINE 2% (20 MG/ML) 5 ML SYRINGE
INTRAMUSCULAR | Status: AC
  Filled 2022-09-05: qty 5

## 2022-09-05 MED ORDER — OXYCODONE HCL 5 MG PO TABS
10.0000 mg | ORAL_TABLET | Freq: Once | ORAL | Status: AC
  Administered 2022-09-05: 10 mg via ORAL

## 2022-09-05 MED ORDER — ONDANSETRON HCL 4 MG/2ML IJ SOLN
INTRAMUSCULAR | Status: AC
  Filled 2022-09-05: qty 2

## 2022-09-05 MED ORDER — MEPERIDINE HCL 25 MG/ML IJ SOLN: 6.2500 mg | INTRAMUSCULAR | Status: DC | PRN

## 2022-09-05 MED ORDER — SUGAMMADEX SODIUM 200 MG/2ML IV SOLN
INTRAVENOUS | Status: DC | PRN
  Administered 2022-09-05: 200 mg via INTRAVENOUS

## 2022-09-05 MED ORDER — HYDROMORPHONE HCL 1 MG/ML IJ SOLN
INTRAMUSCULAR | Status: AC
  Filled 2022-09-05: qty 1

## 2022-09-05 MED ORDER — OXYCODONE HCL 5 MG PO TABS
ORAL_TABLET | ORAL | Status: AC
  Filled 2022-09-05: qty 2

## 2022-09-05 MED ORDER — DEXAMETHASONE SODIUM PHOSPHATE 10 MG/ML IJ SOLN
INTRAMUSCULAR | Status: AC
  Filled 2022-09-05: qty 1

## 2022-09-05 MED ORDER — LIDOCAINE 2% (20 MG/ML) 5 ML SYRINGE
INTRAMUSCULAR | Status: DC | PRN
  Administered 2022-09-05: 100 mg via INTRAVENOUS

## 2022-09-05 MED ORDER — MIDAZOLAM HCL 2 MG/2ML IJ SOLN
INTRAMUSCULAR | Status: AC
  Filled 2022-09-05: qty 2

## 2022-09-05 MED ORDER — CHLORHEXIDINE GLUCONATE 0.12 % MT SOLN
15.0000 mL | Freq: Once | OROMUCOSAL | Status: AC
  Administered 2022-09-05: 15 mL via OROMUCOSAL
  Filled 2022-09-05: qty 15

## 2022-09-05 MED ORDER — VANCOMYCIN HCL IN DEXTROSE 1-5 GM/200ML-% IV SOLN
1000.0000 mg | INTRAVENOUS | Status: AC
  Administered 2022-09-05: 1000 mg via INTRAVENOUS
  Filled 2022-09-05: qty 200

## 2022-09-05 SURGICAL SUPPLY — 52 items
ADH SKN CLS APL DERMABOND .7 (GAUZE/BANDAGES/DRESSINGS) ×1
BAG COUNTER SPONGE SURGICOUNT (BAG) ×1 IMPLANT
BAG SPNG CNTER NS LX DISP (BAG) ×1
BAND INSRT 18 STRL LF DISP RB (MISCELLANEOUS) ×2
BAND RUBBER #18 3X1/16 STRL (MISCELLANEOUS) ×2 IMPLANT
BIT DRILL FLUTELESS (BIT) IMPLANT
BLADE CLIPPER SURG (BLADE) IMPLANT
BNDG GAUZE DERMACEA FLUFF 4 (GAUZE/BANDAGES/DRESSINGS) IMPLANT
BNDG GZE DERMACEA 4 6PLY (GAUZE/BANDAGES/DRESSINGS)
BUR DRUM 4.0 (BURR) IMPLANT
BUR MATCHSTICK NEURO 3.0 LAGG (BURR) ×1 IMPLANT
CANISTER SUCT 3000ML PPV (MISCELLANEOUS) ×1 IMPLANT
DERMABOND ADVANCED .7 DNX12 (GAUZE/BANDAGES/DRESSINGS) ×1 IMPLANT
DISC CERVICAL PRESTIGE 7X14MM (Spacer) IMPLANT
DRAPE C-ARM 42X72 X-RAY (DRAPES) ×1 IMPLANT
DRAPE C-ARMOR (DRAPES) ×1 IMPLANT
DRAPE HALF SHEET 40X57 (DRAPES) IMPLANT
DRAPE LAPAROTOMY 100X72 PEDS (DRAPES) ×1 IMPLANT
DRAPE MICROSCOPE SLANT 54X150 (MISCELLANEOUS) ×1 IMPLANT
DURAPREP 6ML APPLICATOR 50/CS (WOUND CARE) ×1 IMPLANT
ELECT COATED BLADE 2.86 ST (ELECTRODE) ×1 IMPLANT
ELECT REM PT RETURN 9FT ADLT (ELECTROSURGICAL) ×1
ELECTRODE REM PT RTRN 9FT ADLT (ELECTROSURGICAL) ×1 IMPLANT
GAUZE 4X4 16PLY ~~LOC~~+RFID DBL (SPONGE) IMPLANT
GLOVE ECLIPSE 6.5 STRL STRAW (GLOVE) ×1 IMPLANT
GLOVE EXAM NITRILE XL STR (GLOVE) IMPLANT
GOWN STRL REUS W/ TWL LRG LVL3 (GOWN DISPOSABLE) ×2 IMPLANT
GOWN STRL REUS W/ TWL XL LVL3 (GOWN DISPOSABLE) IMPLANT
GOWN STRL REUS W/TWL 2XL LVL3 (GOWN DISPOSABLE) IMPLANT
GOWN STRL REUS W/TWL LRG LVL3 (GOWN DISPOSABLE) ×2
GOWN STRL REUS W/TWL XL LVL3 (GOWN DISPOSABLE)
KIT BASIN OR (CUSTOM PROCEDURE TRAY) ×1 IMPLANT
KIT TURNOVER KIT B (KITS) ×1 IMPLANT
NDL HYPO 25X1 1.5 SAFETY (NEEDLE) ×1 IMPLANT
NDL SPNL 18GX3.5 QUINCKE PK (NEEDLE) IMPLANT
NDL SPNL 22GX3.5 QUINCKE BK (NEEDLE) ×1 IMPLANT
NEEDLE HYPO 25X1 1.5 SAFETY (NEEDLE) ×1 IMPLANT
NEEDLE SPNL 18GX3.5 QUINCKE PK (NEEDLE) ×1 IMPLANT
NEEDLE SPNL 22GX3.5 QUINCKE BK (NEEDLE) ×1 IMPLANT
NS IRRIG 1000ML POUR BTL (IV SOLUTION) ×1 IMPLANT
PACK LAMINECTOMY NEURO (CUSTOM PROCEDURE TRAY) ×1 IMPLANT
PAD ARMBOARD 7.5X6 YLW CONV (MISCELLANEOUS) ×3 IMPLANT
PIN DISTRACTION 14MM (PIN) IMPLANT
SPIKE FLUID TRANSFER (MISCELLANEOUS) ×1 IMPLANT
SPONGE INTESTINAL PEANUT (DISPOSABLE) ×1 IMPLANT
SPONGE SURGIFOAM ABS GEL SZ50 (HEMOSTASIS) ×1 IMPLANT
SUT VIC AB 0 CT1 27 (SUTURE) ×1
SUT VIC AB 0 CT1 27XBRD ANTBC (SUTURE) ×1 IMPLANT
SUT VIC AB 3-0 SH 8-18 (SUTURE) ×1 IMPLANT
TOWEL GREEN STERILE (TOWEL DISPOSABLE) ×1 IMPLANT
TOWEL GREEN STERILE FF (TOWEL DISPOSABLE) ×1 IMPLANT
WATER STERILE IRR 1000ML POUR (IV SOLUTION) ×1 IMPLANT

## 2022-09-05 NOTE — Transfer of Care (Signed)
Immediate Anesthesia Transfer of Care Note  Patient: Maureen Randall  Procedure(s) Performed: CERVICAL SIX-CERVICAL SEVEN ARTIFICIAL [place] REPLACEMENT  Patient Location: PACU  Anesthesia Type:General  Level of Consciousness: drowsy and patient cooperative  Airway & Oxygen Therapy: Patient Spontanous Breathing and Patient connected to nasal cannula oxygen  Post-op Assessment: Report given to RN and Post -op Vital signs reviewed and stable  Post vital signs: Reviewed and stable  Last Vitals:  Vitals Value Taken Time  BP 140/110 09/05/22 1200  Temp    Pulse 76 09/05/22 1203  Resp 15 09/05/22 1203  SpO2 100 % 09/05/22 1203  Vitals shown include unvalidated device data.  Last Pain:  Vitals:   09/05/22 0757  TempSrc: Oral         Complications: No notable events documented.

## 2022-09-05 NOTE — H&P (Signed)
BP 133/78   Pulse (!) 59   Temp 98.2 F (36.8 C) (Oral)   Resp 20   Ht '5\' 7"'$  (1.702 m)   Wt 96.6 kg   SpO2 99%   BMI 33.36 kg/m      Maureen Randall comes in today for evaluation of pain that she is having in the lower back.  She has had 2 prior surgeries in the lower back.  She had an anterior lumbar interbody arthrodesis in 2021, performed by Dr. Patrice Paradise.  She had a posterior operation at L4-5 in 2015. She has had bilateral hip replacements.  She has undergone physical therapy, dry needling, injections of many types.  I think she has also had chiropractic treatment.  She has had steroid tapers.  She has had surgeries on her right shoulder.  She has had radiofrequency ablations also.  She comes in today stating she just would like to know what is going on with her pain.  She says that she did feel better after the initial lumbar fusion at L4-5, and was then having problems in the hips.  She had a hip operation and then she had the other hip operation, both were done.  She had arthroscopic surgery, I believe, on the hips prior to that, and it just was not clear exactly what was happening.  She was seen by Dr. Patrice Paradise in 2021, and he felt that an anterior lumbar interbody arthrodesis at 5-1 would be helpful.  She says nothing truly happened with that surgery.  She weighs 202 pounds.  Temperature is 97.9.  She is 5 feet 7 inches in height.  Blood pressure is 129/88, pulse is 71.  Pain is 6/10.  She is alert, oriented by 4. She answers all questions appropriately.  Memory, language, attention span, and fund of knowledge are normal.  Speech is clear, it is also fluent.  Hearing intact to voice.  Uvula elevates midline. Shoulder shrug is normal. Tongue protrudes in the midline.  She has normal muscle tone, bulk, and coordination.     She says the pain started gradually, started May 09, 2010.  She has undergone a right hip arthroscopic surgery, right broken foot surgery, lumbar fusion, right shoulder rotator  cuff, right hip total replacement, left hip total replacement, left rotator cuff.  She says the hip replacements were done because she was uneven in the lower extremity length.  Left rotator cuff surgery, septoplasty, left foot ganglion cyst removal, left foot peroneal nerve release, right shoulder biceps tendinosis, spur removal, and labrum repair.     MEDICATIONS :  She currently takes Percocet, Lyrica, Cymbalta, Belbuca, Trazodone, Xarelto, Baclofen, Ondansetron, Dexilant, Hydroxyzine, Voltaren, methocarbamol, Melatonin.     ALLERGIES :  Septra causes hives.  Amoxicillin causes hives.  Cephalexin causes a rash.     REVIEW OF SYSTEMS :  Positive for easy bruising, fatigue, easy bleeding, arm and leg pain, joint pain, muscle weakness, dizziness, anxiety, neck pain, back pain.  She has a history of arthritis, blood clots, and deep venous thrombosis.  She does feel that she has weakness in the lower extremities and in the upper extremities.  She can stand for approximately 30 minutes without pain.     SOCIAL HISTORY :  She is married.  She does have children.  She is right handed.  She does not use alcohol.  She does not use tobacco.      Maureen Randall comes in today for followup after the MRI.  What it shows is  she still has this degenerated level at 6-7 that is isolated.  The rest of the cervical spine looks good.  I still believe that this one isolated degenerated level is causing her pain.  She does have foraminal narrowing there.  I would like to do a cervical arthroplasty so that she can get decompressed and maintain movement.  She is in agreement with that.       She weighs 212 pounds.  Temperature is 97.8, blood pressure 105/73, pulse 83.  Pain is 5/10.  She is alert, oriented by 4.  Answers all questions appropriately.  Full strength in the upper extremities.  Normal muscle tone and bulk.       I gave her a detailed instruction sheet with regard to the operation.  We  discussed it at length.  She understands and wishes to proceed.

## 2022-09-05 NOTE — Discharge Instructions (Addendum)
You may restart the Xarelto on Friday, after 1pm

## 2022-09-05 NOTE — Anesthesia Procedure Notes (Signed)
Procedure Name: Intubation Date/Time: 09/05/2022 9:41 AM  Performed by: Ester Rink, CRNAPre-anesthesia Checklist: Patient identified, Emergency Drugs available, Suction available and Patient being monitored Patient Re-evaluated:Patient Re-evaluated prior to induction Oxygen Delivery Method: Circle system utilized Preoxygenation: Pre-oxygenation with 100% oxygen Induction Type: IV induction Ventilation: Mask ventilation without difficulty Laryngoscope Size: Mac and 4 Grade View: Grade I Tube type: Oral Tube size: 7.0 mm Number of attempts: 1 Airway Equipment and Method: Stylet and Oral airway Placement Confirmation: ETT inserted through vocal cords under direct vision, positive ETCO2 and breath sounds checked- equal and bilateral Secured at: 22 cm Tube secured with: Tape Dental Injury: Teeth and Oropharynx as per pre-operative assessment

## 2022-09-05 NOTE — Op Note (Signed)
09/05/2022  12:05 PM  PATIENT:  Maureen Randall  57 y.o. female  PRE-OPERATIVE DIAGNOSIS:  Cervical radiculopathy, Cervical DDD C6/7  POST-OPERATIVE DIAGNOSIS:  Cervical radiculopathy, cervical DDD C6/7  PROCEDURE:  Anterior Cervical decompression C6/7 Arthroplasty C6/7, medtronic 7x71m  SURGEON:   Surgeon(s): CAshok Pall MD   ASSISTANTS:none  ANESTHESIA:   general  EBL:  Total I/O In: 900 [I.V.:900] Out: 100 [Blood:100]  BLOOD ADMINISTERED:none  CELL SAVER GIVEN:none  COUNT:per nursing  DRAINS: none   SPECIMEN:  No Specimen  DICTATION: Maureen Randall taken to the operating room, intubated, and placed under general anesthesia without difficulty. She was positioned supine with her head in slight extension on the bed. The neck was prepped and draped in a sterile manner. I infiltrated 4 cc's 1/2%lidocaine/1:200,000 strength epinephrine into the planned incision starting from the midline to the medial border of the left sternocleidomastoid muscle. I opened the incision with a 10 blade and dissected sharply through soft tissue to the platysma. I dissected in the plane superior to the platysma both rostrally and caudally. I then opened the platysma in a horizontal fashion with Metzenbaum scissors, and dissected in the inferior plane rostrally and caudally. With both blunt and sharp technique I created an avascular corridor to the cervical spine. I placed a spinal needle(s) in the disc space at C5/6 . I then reflected the longus colli from C6 to C7 and placed self retaining retractors. I opened the disc space(s) at C6/7 with a 15 blade. I removed disc with curettes, Kerrison punches, and the drill. Using the drill I removed osteophytes and prepared for the decompression.  I decompressed the spinal canal and the C7 root(s) with the drill, Kerrison punches, and the curettes. I used the microscope to aid in microdissection. I removed the posterior longitudinal ligament to fully expose  and decompress the thecal sac. I exposed the roots laterally taking down the 6/7 uncovertebral joints. With the decompression complete I moved on to the arthroplasty. I used the drill to level the surfaces of C6, and 7. I removed soft tissue to prepare the disc space and the bony surfaces. I measured the space and placed a 7x183mtrial. I used fluoroscopy and felt the trial was a very good fit. I then placed the medtronic disc into the C6/7 disc space. Intraoperative fluoroscopy showed the arthroplasty to be in good position. I irrigated the wound, achieved hemostasis, and closed the wound in layers. I approximated the platysma, and the subcuticular plane with vicryl sutures. I used Dermabond for a sterile dressing.   PLAN OF CARE: Discharge to home after PACU  PATIENT DISPOSITION:  PACU - hemodynamically stable.   Delay start of Pharmacological VTE agent (>24hrs) due to surgical blood loss or risk of bleeding:  no

## 2022-09-06 ENCOUNTER — Encounter (HOSPITAL_COMMUNITY): Payer: Self-pay | Admitting: Neurosurgery

## 2022-09-06 MED FILL — Thrombin For Soln 5000 Unit: CUTANEOUS | Qty: 2 | Status: AC

## 2022-09-06 NOTE — Anesthesia Postprocedure Evaluation (Signed)
Anesthesia Post Note  Patient: Maureen Randall  Procedure(s) Performed: CERVICAL SIX-CERVICAL SEVEN ARTIFICIAL [place] REPLACEMENT     Patient location during evaluation: PACU Anesthesia Type: General Level of consciousness: sedated and patient cooperative Pain management: pain level controlled Vital Signs Assessment: post-procedure vital signs reviewed and stable Respiratory status: spontaneous breathing Cardiovascular status: stable Anesthetic complications: no   No notable events documented.  Last Vitals:  Vitals:   09/05/22 1230 09/05/22 1245  BP: 139/81 128/82  Pulse: 77 73  Resp: 15 17  Temp:  36.8 C  SpO2: 99% 98%    Last Pain:  Vitals:   09/05/22 1245  TempSrc:   PainSc: [place]

## 2022-09-10 ENCOUNTER — Telehealth: Payer: Self-pay

## 2022-09-10 ENCOUNTER — Emergency Department
Admission: EM | Admit: 2022-09-10 | Discharge: 2022-09-10 | Disposition: A | Discharge status: Home / Self Care | Payer: Medicare Other | Attending: Emergency Medicine | Admitting: Emergency Medicine

## 2022-09-10 ENCOUNTER — Other Ambulatory Visit: Payer: Self-pay

## 2022-09-10 ENCOUNTER — Encounter: Payer: Self-pay | Admitting: Radiology

## 2022-09-10 ENCOUNTER — Emergency Department: Payer: Medicare Other

## 2022-09-10 DIAGNOSIS — R079 Chest pain, unspecified: Secondary | ICD-10-CM | POA: Diagnosis present

## 2022-09-10 DIAGNOSIS — K449 Diaphragmatic hernia without obstruction or gangrene: Secondary | ICD-10-CM | POA: Diagnosis not present

## 2022-09-10 DIAGNOSIS — Z7901 Long term (current) use of anticoagulants: Secondary | ICD-10-CM | POA: Insufficient documentation

## 2022-09-10 DIAGNOSIS — R0789 Other chest pain: Secondary | ICD-10-CM | POA: Insufficient documentation

## 2022-09-10 DIAGNOSIS — J449 Chronic obstructive pulmonary disease, unspecified: Secondary | ICD-10-CM | POA: Insufficient documentation

## 2022-09-10 LAB — BASIC METABOLIC PANEL
Anion gap: 10 (ref 5–15)
BUN: 18 mg/dL (ref 6–20)
CO2: 27 mmol/L (ref 22–32)
Calcium: 9.3 mg/dL (ref 8.9–10.3)
Chloride: 102 mmol/L (ref 98–111)
Creatinine, Ser: 0.72 mg/dL (ref 0.44–1.00)
GFR, Estimated: >60 mL/min (ref >60)
Glucose, Bld: 115 mg/dL — ABNORMAL HIGH (ref 70–99)
Potassium: 3.5 mmol/L (ref 3.5–5.1)
Sodium: 139 mmol/L (ref 135–145)

## 2022-09-10 LAB — CBC WITH DIFFERENTIAL/PLATELET
Abs Immature Granulocytes: 0.01 10*3/uL (ref 0.00–0.07)
Basophils Absolute: 0 10*3/uL (ref 0.0–0.1)
Basophils Relative: 1 %
Eosinophils Absolute: 0.3 10*3/uL (ref 0.0–0.5)
Eosinophils Relative: 4 %
HCT: 39.9 % (ref 36.0–46.0)
Hemoglobin: 13.2 g/dL (ref 12.0–15.0)
Immature Granulocytes: 0 %
Lymphocytes Relative: 31 %
Lymphs Abs: 2.1 10*3/uL (ref 0.7–4.0)
MCH: 31.3 pg (ref 26.0–34.0)
MCHC: 33.1 g/dL (ref 30.0–36.0)
MCV: 94.5 fL (ref 80.0–100.0)
Monocytes Absolute: 0.6 10*3/uL (ref 0.1–1.0)
Monocytes Relative: 8 %
Neutro Abs: 3.9 10*3/uL (ref 1.7–7.7)
Neutrophils Relative %: 56 %
Platelets: 393 10*3/uL (ref 150–400)
RBC: 4.22 MIL/uL (ref 3.87–5.11)
RDW: 13.7 % (ref 11.5–15.5)
WBC: 6.8 10*3/uL (ref 4.0–10.5)
nRBC: 0 % (ref 0.0–0.2)

## 2022-09-10 LAB — TROPONIN I (HIGH SENSITIVITY)
Troponin I (High Sensitivity): 3 ng/L (ref <18)
Troponin I (High Sensitivity): 3 ng/L (ref <18)

## 2022-09-10 MED ORDER — IOHEXOL 350 MG/ML SOLN
75.0000 mL | Freq: Once | INTRAVENOUS | Status: AC | PRN
  Administered 2022-09-10: 75 mL via INTRAVENOUS

## 2022-09-10 MED ORDER — OXYCODONE-ACETAMINOPHEN 5-325 MG PO TABS
1.0000 | ORAL_TABLET | Freq: Once | ORAL | Status: AC
  Administered 2022-09-10: 1 via ORAL
  Filled 2022-09-10: qty 1

## 2022-09-10 NOTE — Telephone Encounter (Signed)
Patient left a voicemail wanting to know if her follow up appointment with Dr. Allen Norris could be move up because she is having complications from some surgery she had done. Her appointment is schedule for 11/01/2022. Tried to call patient but voicemail is not set up yet. Added patient to the voicemail. Forwarding message to melanie to see if she can work patient in sooner

## 2022-09-10 NOTE — ED Triage Notes (Signed)
Pt had disc replacement the 28th with intubation. This morning pt had indigestion and severe chest pain that made it difficult to breath. Pt stated chest pain lasted about 15 min and went to pressure. EMS EKG unremarkable. Pt has history of DVT.

## 2022-09-10 NOTE — ED Notes (Signed)
Pt to CT at this time.

## 2022-09-10 NOTE — ED Provider Notes (Signed)
[facility] Provider Note    Event Date/Time   First MD Initiated Contact with Patient 09/10/22 [phone]     (approximate)   History   Chief Complaint Chest Pain (Indigestion and chest pressure/SOB)   HPI  Maureen Randall is a 57 y.o. female with past medical history of COPD, GERD, anxiety, DVT on Xarelto, and chronic pain syndrome who presents to the ED complaining of chest pain.  Patient reports that she has been dealing with reflux-like symptoms for about the past week where she has felt stomach acid come up into her throat with mild discomfort in her chest.  She then had surgery on her cervical spine at [facility] 5 days ago, states she has dealt with a cough since the surgery but has not had any fevers or difficulty breathing.  About 45 minutes prior to arrival she was taking a shower when she began to have severe pain in the center of her chest.  She describes it as a pressure that is worse when she takes a deep breath, although symptoms have seemed to improve since onset.  She reports some difficulty breathing initially that has improved, she has not noticed any pain or swelling in her legs.  She had to hold her Xarelto with her recent procedure, started back on it yesterday.     Physical Exam   Triage Vital Signs: ED Triage Vitals [09/10/22 0940]  Enc Vitals Group     BP      Pulse      Resp      Temp      Temp src      SpO2 94 %     Weight      Height      Head Circumference      Peak Flow      Pain Score      Pain Loc      Pain Edu?      Excl. in [place]?     Most recent vital signs: Vitals:   09/10/22 1200 09/10/22 1230  BP: 116/75 111/71  Pulse: 70 68  Resp: 20 14  Temp:    SpO2: 98% 96%    Constitutional: Alert and oriented. Eyes: Conjunctivae are normal. Head: Atraumatic. Nose: No congestion/rhinnorhea. Mouth/Throat: Mucous membranes are moist.  Neck: Anterior neck surgical site clean, dry, and intact. Cardiovascular: Normal rate,  regular rhythm. Grossly normal heart sounds.  2+ radial pulses bilaterally. Respiratory: Normal respiratory effort.  No retractions. Lungs CTAB.  No chest wall tenderness to palpation. Gastrointestinal: Soft and nontender. No distention. Musculoskeletal: No lower extremity tenderness nor edema.  Neurologic:  Normal speech and language. No gross focal neurologic deficits are appreciated.    ED Results / Procedures / Treatments   Labs (all labs ordered are listed, but only abnormal results are displayed) Labs Reviewed  BASIC METABOLIC PANEL - Abnormal; Notable for the following components:      Result Value   Glucose, Bld 115 (*)    All other components within normal limits  CBC WITH DIFFERENTIAL/PLATELET  TROPONIN I (HIGH SENSITIVITY)  TROPONIN I (HIGH SENSITIVITY)     EKG  ED ECG REPORT I, Blake Divine, the attending physician, personally viewed and interpreted this ECG.   Date: 09/10/2022  EKG Time: 9:43  Rate: 76  Rhythm: normal sinus rhythm  Axis: Normal  Intervals:none  ST&T Change: None  RADIOLOGY CTA chest reviewed and interpreted by me with no large pulmonary embolism or focal infiltrate.  PROCEDURES:  Critical Care performed: No  Procedures   MEDICATIONS ORDERED IN ED: Medications  iohexol (OMNIPAQUE) 350 MG/ML injection 75 mL (75 mLs Intravenous Contrast Given 09/10/22 1141)  oxyCODONE-acetaminophen (PERCOCET/ROXICET) 5-325 MG per tablet 1 tablet (1 tablet Oral Given 09/10/22 1216)     IMPRESSION / MDM / ASSESSMENT AND PLAN / ED COURSE  I reviewed the triage vital signs and the nursing notes.                              57 y.o. female with past medical history of DVT on Xarelto, COPD, GERD, anxiety, and chronic pain syndrome who presents to the ED complaining of 1 week of cough and reflux symptoms, now with about 45 minutes of severe pressure in her chest worse when she takes a deep breath.  Patient's presentation is most consistent with acute  presentation with potential threat to life or bodily function.  Differential diagnosis includes, but is not limited to, ACS, PE, dissection, pneumonia, pneumothorax, GERD, musculoskeletal pain, and anxiety.  Patient nontoxic-appearing and in no acute distress, vital signs are unremarkable.  Symptoms seem atypical for ACS and EKG shows no evidence of arrhythmia or ischemia.  Patient is high risk for PE given her history and recent surgical procedure, will further assess with CTA of her chest.  Pain does seem to be improving and she is not in any respiratory distress, maintaining oxygen saturations at 98% on room air.  Labs including troponin are pending at this time.  CTA chest is negative for PE or other acute process, does redemonstrate moderately sized hiatal hernia which could be contributing to GERD and patient's chest pain.  2 sets of troponin are negative and I doubt ACS, remainder of labs are reassuring with no significant anemia, leukocytosis, electrolyte abnormality, or AKI.  Patient with minimal pain on reassessment and is appropriate for discharge home with PCP follow-up.  She also states that she has follow-up scheduled with GI in 2 weeks.  She was counseled to return to the ED for new or worsening symptoms, patient agrees with plan.      FINAL CLINICAL IMPRESSION(S) / ED DIAGNOSES   Final diagnoses:  Nonspecific chest pain  Hiatal hernia     Rx / DC Orders   ED Discharge Orders     None        Note:  This document was prepared using Dragon voice recognition software and may include unintentional dictation errors.   Blake Divine, MD 09/10/22 1322

## 2022-09-11 ENCOUNTER — Encounter: Payer: Self-pay | Admitting: Gastroenterology

## 2022-09-11 ENCOUNTER — Telehealth: Payer: Self-pay

## 2022-09-11 MED ORDER — OMEPRAZOLE 40 MG PO CPDR: 40.0000 mg | DELAYED_RELEASE_CAPSULE | Freq: Two times a day (BID) | ORAL | 1 refills | Status: DC

## 2022-09-11 MED ORDER — SUCRALFATE 1 G PO TABS: 1.0000 g | ORAL_TABLET | Freq: Four times a day (QID) | ORAL | 1 refills | Status: DC

## 2022-09-11 NOTE — Transitions of Care (Post Inpatient/ED Visit) (Signed)
   09/11/2022  Name: Maureen Randall MRN: [id number] DOB: [birth year]  Today's TOC FU Call Status: Today's TOC FU Call Status:: Successful TOC FU Call Competed TOC FU Call Complete Date: 09/11/22  Transition Care Management Follow-up Telephone Call Date of Discharge: 09/10/22 Discharge Facility: [facility]) Type of Discharge: Emergency Department Reason for ED Visit: Other: (Chest pain) How have you been since you were released from the hospital?: Better Any questions or concerns?: No  Items Reviewed: Did you receive and understand the discharge instructions provided?: Yes Medications obtained and verified?: Yes (Medications Reviewed) (No new medications) Any new allergies since your discharge?: No Dietary orders reviewed?: NA Do you have support at home?: Yes People in Home: spouse  Home Care and Equipment/Supplies: [place] Ordered?: No Any new equipment or medical supplies ordered?: No  Functional Questionnaire: Do you need assistance with bathing/showering or dressing?: No Do you need assistance with meal preparation?: No Do you need assistance with eating?: No Do you have difficulty maintaining continence: No Do you need assistance with getting out of bed/getting out of a chair/moving?: No Do you have difficulty managing or taking your medications?: No  Folllow up appointments reviewed: PCP Follow-up appointment confirmed?: NA (Pt declined appointment with PCP) [facility] Follow-up appointment confirmed?: No Reason Specialist Follow-Up Not Confirmed: Patient has Specialist Provider Number and will Call for Appointment (Pt has called Dr. Dorothey Baseman office for an appointment) Do you need transportation to your follow-up appointment?: No Do you understand care options if your condition(s) worsen?: Yes-patient verbalized understanding    SIGNATURE Ferne Reus, RN

## 2022-09-17 DIAGNOSIS — M25572 Pain in left ankle and joints of left foot: Secondary | ICD-10-CM | POA: Insufficient documentation

## 2022-09-20 ENCOUNTER — Encounter: Payer: Self-pay | Admitting: Family Medicine

## 2022-09-20 ENCOUNTER — Other Ambulatory Visit: Payer: Self-pay

## 2022-09-20 DIAGNOSIS — K219 Gastro-esophageal reflux disease without esophagitis: Secondary | ICD-10-CM

## 2022-09-20 DIAGNOSIS — M1990 Unspecified osteoarthritis, unspecified site: Secondary | ICD-10-CM

## 2022-09-20 MED ORDER — FAMOTIDINE 20 MG PO TABS: 20.0000 mg | ORAL_TABLET | Freq: Two times a day (BID) | ORAL | 3 refills | Status: DC

## 2022-09-20 MED ORDER — FLUTICASONE PROPIONATE 50 MCG/ACT NA SUSP: NASAL | 11 refills | Status: DC

## 2022-09-20 MED ORDER — DICLOFENAC SODIUM 1 % EX GEL: CUTANEOUS | 11 refills | Status: DC

## 2022-09-20 NOTE — Telephone Encounter (Signed)
Appt moved to 10/18/22

## 2022-10-04 ENCOUNTER — Ambulatory Visit
Admission: RE | Admit: 2022-10-04 | Discharge: 2022-10-04 | Disposition: A | Discharge status: Home / Self Care | Payer: Medicare Other | Source: Ambulatory Visit | Attending: Obstetrics and Gynecology | Admitting: Obstetrics and Gynecology

## 2022-10-04 DIAGNOSIS — Z1231 Encounter for screening mammogram for malignant neoplasm of breast: Secondary | ICD-10-CM | POA: Diagnosis not present

## 2022-10-18 ENCOUNTER — Encounter: Payer: Self-pay | Admitting: Gastroenterology

## 2022-10-18 ENCOUNTER — Ambulatory Visit (INDEPENDENT_AMBULATORY_CARE_PROVIDER_SITE_OTHER): Payer: Medicare Other | Admitting: Gastroenterology

## 2022-10-18 ENCOUNTER — Telehealth: Payer: Self-pay | Admitting: Gastroenterology

## 2022-10-18 DIAGNOSIS — K219 Gastro-esophageal reflux disease without esophagitis: Secondary | ICD-10-CM | POA: Diagnosis not present

## 2022-10-18 NOTE — Telephone Encounter (Signed)
Pt had question in ref to Xarelto clearence would like a call back

## 2022-10-18 NOTE — Telephone Encounter (Signed)
Request has been faxed to vascular for Xarelto

## 2022-10-18 NOTE — Progress Notes (Signed)
Primary Care Physician: Dana Allan, MD  Primary Gastroenterologist:  Dr. Midge Minium  No chief complaint on file.   HPI: Maureen Randall is a 57 y.o. female here who reports that after having surgery on her neck she started to have severe chest discomfort with reflux and regurgitation.  The patient had an EGD in the past by me that showed a hiatal hernia that was small.  At that time the patient had a CT scan to rule out PE and it was negative but did show a moderate size hiatal hernia.  The patient reports that she feels like air is stuck in her chest after she eats that she cannot get it up.  She also reports that despite being put on a PPI twice a day and taking Carafate she still has episodes of reflux.  She states that she is feeling better but not back to normal.  Past Medical History:  Diagnosis Date   Adenoma of left adrenal gland 07/26/2020   Anterior to posterior tear of superior glenoid labrum of right shoulder 11/03/2020   Anxiety    Arthralgia of multiple joints 08/31/2015   Arthritis    osteoarthritis-joints, feet, shoulder   Asthma    previously diagnosed after a bout of bronchitis/ no problems since   Biceps tendinitis 09/10/2019   Bulging of cervical intervertebral disc without myelopathy    C6-C7   Bursitis    b/l hips   Chronic pain syndrome    Closed fracture of metatarsal bone 08/06/2017   COPD (chronic obstructive pulmonary disease) (HCC) 2004   patient states probably bronchitis   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   GERD (gastroesophageal reflux disease)    H/O nasal septoplasty 09/10/2019   Headache, post-myelogram  09/11/2021   Headache, post-myelogram 09/11/2021   History of lumbar fusion 01/29/2018   History of right hip replacement (12/2017) 01/29/2018   Hyperlipidemia    Increased band cell count 08/31/2015   Infection of superficial incisional surgical site after procedure 06/07/2021   IUD threads lost 04/30/2018   Left leg DVT (HCC)    fem/pop 12/07/20 after right shoulder surgery 11/30/20   Menopausal flushing 11/30/2020   Mood swings 03/15/2021   Neuropathy    post surgery   Open wound 07/04/2021   Open wound of left lower leg 06/07/2021   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     baclofen (LIORESAL) 10 MG tablet Take 1 tablet (10 mg total) by mouth 3 (three) times daily as needed for muscle spasms. 60 each 1   Buprenorphine HCl (BELBUCA) 450 MCG FILM  Place 450 mcg inside cheek in the morning and at bedtime.     busPIRone (BUSPAR) 10 MG tablet Take 1 tablet (10 mg total) by mouth 3 (three) times daily. (Patient taking differently: Take 10 mg by mouth 2 (two) times daily as needed (anxiety).) 270 tablet 0   cetirizine (ZYRTEC) 10 MG tablet Take 10 mg by mouth daily as needed for allergies.     diclofenac Sodium (VOLTAREN) 1 % GEL APPLY 2 GRAMS TO UPPER BODY FOUR TIMES DAILY AS NEEDED AND 4 GRAMS TO LOWER BODY FOUR TIMES DAILY AS NEEDED 100 g 11   DULoxetine (CYMBALTA) 60 MG capsule TAKE 1 CAPSULE BY MOUTH EVERY DAY 90  capsule 1   estradiol (ESTRACE) 0.1 MG/GM vaginal cream Place 1 Applicatorful vaginally once a week.     ezetimibe (ZETIA) 10 MG tablet Take 1 tablet (10 mg total) by mouth daily. 90 tablet 3   famotidine (PEPCID) 20 MG tablet Take 1 tablet (20 mg total) by mouth 2 (two) times daily. TAKE 1 TABLET BY MOUTH EVERYDAY AT BEDTIME 180 tablet 3   fluticasone (FLONASE) 50 MCG/ACT nasal spray SHAKE LIQUID AND USE 1 TO 2 SPRAYS IN EACH NOSTRIL DAILY AS NEEDED. MAX DOSE 2 SPRAYS IN EACH NOSTRIL 16 g 11   hydrOXYzine (ATARAX) 10 MG tablet TAKE 1 TABLET BY MOUTH DAILY AS NEEDED FOR ANXIETY. 90 tablet 1   methocarbamol (ROBAXIN) 500 MG tablet Take 500 mg by mouth every 6 (six) hours as needed for muscle spasms.     naloxone (NARCAN) nasal spray 4 mg/0.1 mL Place 0.4 mg into the nose once.     nitrofurantoin, macrocrystal-monohydrate, (MACROBID) 100 MG capsule Take 1 capsule (100 mg total) by mouth 2 (two) times daily. X 5 days for UTI (Patient taking differently: Take 100 mg by mouth See admin instructions. Take 100 mg by mouth twice daily for 5 days when needed for UTI) 10 capsule 1   omeprazole (PRILOSEC) 40 MG capsule Take 1 capsule (40 mg total) by mouth 2 (two) times daily before a meal. 60 capsule 1   ondansetron (ZOFRAN-ODT) 8 MG disintegrating tablet Take 1 tablet (8 mg total) by mouth every 8 (eight) hours as needed for nausea or vomiting. 30 tablet 0   oxyCODONE-acetaminophen (PERCOCET) 10-325 MG tablet Take 1 tablet by mouth in the morning, at noon, in the evening, and at bedtime.     pantoprazole (PROTONIX) 40 MG tablet Take 1 tablet (40 mg total) by mouth daily. 30 tablet 2   pregabalin (LYRICA) 50 MG capsule Take 100 mg by mouth 2 (two) times daily.     psyllium (METAMUCIL SMOOTH TEXTURE) 28 % packet Take 1 packet by mouth every other day.     rivaroxaban (XARELTO) 10 MG TABS tablet Take 10 mg by mouth in the morning. (1000)     saccharomyces boulardii (FLORASTOR) 250 MG capsule Take 1 capsule (250  mg total) by mouth daily. 90 capsule 0   sucralfate (CARAFATE) 1 g tablet Take 1 tablet (1 g total) by mouth 4 (four) times daily. 120 tablet 1   traZODone (DESYREL) 100 MG tablet Take 2 tablets (200 mg total) by mouth at bedtime as needed for sleep. (Patient taking differently: Take 150 mg by mouth at bedtime as needed for sleep.) 180 tablet 1   TURMERIC PO Take 2,000 mg by mouth in the morning. 1000 mg/capsule     valACYclovir (VALTREX) 500 MG tablet Take 500 mg by mouth 2 (two) times daily as  needed (outbreaks).     No current facility-administered medications for this visit.    Allergies as of 10/18/2022 - Review Complete 09/11/2022  Allergen Reaction Noted   Amoxicillin Hives 06/14/2018   Crestor [rosuvastatin]  12/15/2021   Septra [sulfamethoxazole-trimethoprim] Hives 01/29/2018   Sulfamethoxazole Hives 04/24/2021   Trimethoprim Hives 04/24/2021   Zanaflex [tizanidine] Other (See Comments) 07/09/2019   Keflex [cephalexin] Rash 01/29/2018    ROS:  General: Negative for anorexia, weight loss, fever, chills, fatigue, weakness. ENT: Negative for hoarseness, difficulty swallowing , nasal congestion. CV: Negative for chest pain, angina, palpitations, dyspnea on exertion, peripheral edema.  Respiratory: Negative for dyspnea at rest, dyspnea on exertion, cough, sputum, wheezing.  GI: See history of present illness. GU:  Negative for dysuria, hematuria, urinary incontinence, urinary frequency, nocturnal urination.  Endo: Negative for unusual weight change.    Physical Examination:   There were no vitals taken for this visit.  General: Well-nourished, well-developed in no acute distress.  Eyes: No icterus. Conjunctivae pink. Lungs: Clear to auscultation bilaterally. Non-labored. Heart: Regular rate and rhythm, no murmurs rubs or gallops.  Abdomen: Bowel sounds are normal, nontender, nondistended, no hepatosplenomegaly or masses, no abdominal bruits or hernia , no rebound or  guarding.   Extremities: No lower extremity edema. No clubbing or deformities. Neuro: Alert and oriented x 3.  Grossly intact. Skin: Warm and dry, no jaundice.   Psych: Alert and cooperative, normal mood and affect.  Labs:    Imaging Studies: MM 3D SCREEN BREAST BILATERAL  Result Date: 10/05/2022 CLINICAL DATA:  Screening. EXAM: DIGITAL SCREENING BILATERAL MAMMOGRAM WITH TOMOSYNTHESIS AND CAD TECHNIQUE: Bilateral screening digital craniocaudal and mediolateral oblique mammograms were obtained. Bilateral screening digital breast tomosynthesis was performed. The images were evaluated with computer-aided detection. COMPARISON:  Previous exam(s). ACR Breast Density Category b: There are scattered areas of fibroglandular density. FINDINGS: There are no findings suspicious for malignancy. IMPRESSION: No mammographic evidence of malignancy. A result letter of this screening mammogram will be mailed directly to the patient. RECOMMENDATION: Screening mammogram in one year. (Code:SM-B-01Y) BI-RADS CATEGORY  1: Negative. Electronically Signed   By: Emmaline Kluver M.D.   On: 10/05/2022 10:15    Assessment and Plan:   Maureen Randall is a 57 y.o. y/o female who comes in today with acid breakthrough despite being on a PPI twice a day and taking Carafate.  The patient also was found to have a large size hiatal hernia when she had a small hiatal hernia when I did her upper endoscopy.  The patient has been told that she should be set up for a Bravo pH study with pH monitoring while on medication to see why she is still having symptoms despite being on maximum acid reduction therapy.  She has also been told that if the hiatal hernia is large and she feels the symptoms she may need to consider antireflux surgery.  The patient has been explained the plan and agrees with it.     Midge Minium, MD. Clementeen Graham    Note: This dictation was prepared with Dragon dictation along with smaller phrase technology. Any  transcriptional errors that result from this process are unintentional.

## 2022-10-19 NOTE — Addendum Note (Signed)
Addended by: Roena Malady on: 10/19/2022 10:17 AM   Modules accepted: Orders

## 2022-10-22 NOTE — Telephone Encounter (Signed)
Clearance faxed x 3 to vascular

## 2022-10-23 ENCOUNTER — Other Ambulatory Visit: Payer: Medicare Other

## 2022-10-30 NOTE — Telephone Encounter (Signed)
Clearance faxed x 2 

## 2022-11-01 ENCOUNTER — Ambulatory Visit: Payer: Medicare Other | Admitting: Gastroenterology

## 2022-11-04 NOTE — Progress Notes (Addendum)
   SUBJECTIVE:   Chief Complaint  Patient presents with   Medical Management of Chronic Issues   HPI Patient presents to clinic to discuss chronic care management.  No acute concerns  Requesting medication for weight loss.  Has not previously tried medications in the past.  Has not incorporated healthy diet and exercise.  No history of thyroid cancer, pancreatitis or family history of medullary thyroid cancer.  She reports wanting to start GLP-1 injectable.  Is willing to trial nutrition consult for healthy weight loss and referral to healthy weight and wellness.  PERTINENT PMH / PSH: Hypertension Mood disorder Chronic pain Osteoarthritis Chronic opioid use Mood disorder   OBJECTIVE:  BP 114/70   Pulse 86   Temp 98.2 F (36.8 C)   Resp 16   Ht 5\' 7"  (1.702 m)   Wt 215 lb (97.5 kg)   SpO2 98%   BMI 33.67 kg/m    Physical Exam Vitals reviewed.  Constitutional:      General: She is not in acute distress.    Appearance: Normal appearance. She is obese. She is not ill-appearing, toxic-appearing or diaphoretic.  Eyes:     General:        Right eye: No discharge.        Left eye: No discharge.     Conjunctiva/sclera: Conjunctivae normal.  Cardiovascular:     Rate and Rhythm: Normal rate and regular rhythm.     Heart sounds: Normal heart sounds.  Pulmonary:     Effort: Pulmonary effort is normal.     Breath sounds: Normal breath sounds.  Neurological:     Mental Status: She is alert and oriented to person, place, and time. Mental status is at baseline.  Psychiatric:        Mood and Affect: Mood normal.        Behavior: Behavior normal.        Thought Content: Thought content normal.        Judgment: Judgment normal.     ASSESSMENT/PLAN:  Obesity (BMI 30-39.9) Assessment & Plan: Chronic.  BMI elevated greater than 33 with history of hypertension, dyslipidemia. Will obtain blood work today. Discussed with patient injectables on backorder and may not be covered  for weight loss under insurance.  Referral sent to healthy weight and wellness clinic Referral nutrition consult   Orders: -     Amb ref to Medical Nutrition Therapy-MNT -     CBC with Differential/Platelet -     Comprehensive metabolic panel -     TSH -     Vitamin B12  Abnormal glucose -     Hemoglobin A1c    PDMP reviewed  Return for PCP.  Dana Allan, MD

## 2022-11-04 NOTE — Patient Instructions (Incomplete)
It was a pleasure seeing you you today. Thank you for allowing me to take part in your health care.  Our goals for today as we discussed include:  Look at [facility] website to see if this may be an option for you with your weight loss. [facility] [address] [place] [phone]    Recommend dietitian referral    If you have any questions or concerns, please do not hesitate to call the office at [phone].  I look forward to our next visit and until then take care and stay safe.  Regards,   Dana Allan, MD   [facility]

## 2022-11-05 ENCOUNTER — Encounter: Payer: Self-pay | Admitting: Family Medicine

## 2022-11-05 ENCOUNTER — Ambulatory Visit (INDEPENDENT_AMBULATORY_CARE_PROVIDER_SITE_OTHER): Payer: Medicare Other | Admitting: Family Medicine

## 2022-11-05 VITALS — BP 114/70 | HR 86 | Temp 98.2°F | Resp 16 | Ht 67.0 in | Wt 215.0 lb

## 2022-11-05 DIAGNOSIS — R7309 Other abnormal glucose: Secondary | ICD-10-CM

## 2022-11-05 DIAGNOSIS — Z1329 Encounter for screening for other suspected endocrine disorder: Secondary | ICD-10-CM

## 2022-11-05 DIAGNOSIS — E669 Obesity, unspecified: Secondary | ICD-10-CM | POA: Diagnosis not present

## 2022-11-05 LAB — CBC WITH DIFFERENTIAL/PLATELET
Basophils Absolute: 0 10*3/uL (ref 0.0–0.1)
Basophils Relative: 1 % (ref 0.0–3.0)
Eosinophils Absolute: 0.1 10*3/uL (ref 0.0–0.7)
Eosinophils Relative: 2.5 % (ref 0.0–5.0)
HCT: 40.7 % (ref 36.0–46.0)
Hemoglobin: 13.4 g/dL (ref 12.0–15.0)
Lymphocytes Relative: 30 % (ref 12.0–46.0)
Lymphs Abs: 1.3 10*3/uL (ref 0.7–4.0)
MCHC: 33 g/dL (ref 30.0–36.0)
MCV: 94.2 fl (ref 78.0–100.0)
Monocytes Absolute: 0.3 10*3/uL (ref 0.1–1.0)
Monocytes Relative: 7.6 % (ref 3.0–12.0)
Neutro Abs: 2.5 10*3/uL (ref 1.4–7.7)
Neutrophils Relative %: 58.9 % (ref 43.0–77.0)
Platelets: 365 10*3/uL (ref 150.0–400.0)
RBC: 4.32 Mil/uL (ref 3.87–5.11)
RDW: 13.6 % (ref 11.5–15.5)
WBC: 4.3 10*3/uL (ref 4.0–10.5)

## 2022-11-05 LAB — COMPREHENSIVE METABOLIC PANEL
ALT: 17 U/L (ref 0–35)
AST: 19 U/L (ref 0–37)
Albumin: 4.3 g/dL (ref 3.5–5.2)
Alkaline Phosphatase: 87 U/L (ref 39–117)
BUN: 18 mg/dL (ref 6–23)
CO2: 28 mEq/L (ref 19–32)
Calcium: 9.4 mg/dL (ref 8.4–10.5)
Chloride: 103 mEq/L (ref 96–112)
Creatinine, Ser: 0.77 mg/dL (ref 0.40–1.20)
GFR: 85.72 mL/min (ref >60.00)
Glucose, Bld: 100 mg/dL — ABNORMAL HIGH (ref 70–99)
Potassium: 4.1 mEq/L (ref 3.5–5.1)
Sodium: 140 mEq/L (ref 135–145)
Total Bilirubin: 0.5 mg/dL (ref 0.2–1.2)
Total Protein: 7.1 g/dL (ref 6.0–8.3)

## 2022-11-05 LAB — VITAMIN B12: Vitamin B-12: 235 pg/mL (ref 211–911)

## 2022-11-05 LAB — TSH: TSH: 3.04 u[IU]/mL (ref 0.35–5.50)

## 2022-11-05 LAB — HEMOGLOBIN A1C: Hgb A1c MFr Bld: 5.6 % (ref 4.6–6.5)

## 2022-11-06 ENCOUNTER — Other Ambulatory Visit: Payer: Self-pay | Admitting: Gastroenterology

## 2022-11-06 NOTE — Telephone Encounter (Signed)
Faxed again x 2 to vascular

## 2022-11-07 ENCOUNTER — Encounter (INDEPENDENT_AMBULATORY_CARE_PROVIDER_SITE_OTHER): Payer: Self-pay | Admitting: Family Medicine

## 2022-11-07 DIAGNOSIS — Z0289 Encounter for other administrative examinations: Secondary | ICD-10-CM

## 2022-11-07 NOTE — H&P (Signed)
Ms. Maureen Randall is a 57 y.o. female here for Pre Op Consulting (Sign consents) . History of Present Illness: Patient presents for a preoperative visit to schedule a Hysteroscopy, and IUD removal for lost IUD strings. Ultrasound has confirmed in place.   Pertinent hx:   -Mirena IUD, replaced 10/2016, with cervical block under US guidance -IUD strings not noted at last annual, TVUS confirmed IUD at proper location 06/27/20   -No menses  -Vaginal estrogen for vaginal dryness; once a week, sometimes once every other week with improvement. Cannot do too much or her breasts will feel sore. -Chronic pain d/t osteoarthritis    -Hx of DVT in 2012   Past Medical History:  has a past medical history of Anxiety (2019), Asthma, unspecified asthma severity, unspecified whether complicated, unspecified whether persistent (HHS-HCC) (2020), Chronic pain syndrome, Cold sore, COPD (chronic obstructive pulmonary disease) (CMS/HHS-HCC) (2004), Depression, GERD (gastroesophageal reflux disease), Heterozygous factor V Leiden mutation (CMS/HHS-HCC) (07/04/2021), History of DVT (deep vein thrombosis) (05/2011), Hyperlipemia, Osteoarthritis, Peripheral neuropathy, Peripheral vascular disease, unspecified (CMS-HCC), PONV (postoperative nausea and vomiting), Spondylolisthesis, and Venous thromboembolism (06/2011).  Past Surgical History:  has a past surgical history that includes Hip arthroscopy (Right, 05/2011); orif right navicular bone (Right, 07/2013); Posterior Lumbar Spine Fusion One Level (07/2014); Arthroscopic Rotator Cuff Repair (Right, 2017); Endoscopic Carpal Tunnel Release (Right); Fracture surgery (07/2013); Back surgery (07/2014); other surgery (12/2017); other surgery (05/2018); Arthroscopic Rotator Cuff Repair (04/2019); septioplasty (06/2019); Neuroma resection  (12/2019); R shoulder surgery (11/2020); Colonoscopy; Back surgery (09/2019); excision neuroma hand (Left, 05/08/2021); Total shoulder replacement (Right,  12/2021); Joint replacement (Rt hip 06/19- left hip 11/19); and Spine surgery (07/2013, 09/2019). Family History: family history includes Alzheimer's disease in her father; Arthritis in her mother; COPD in her mother; Dementia in her father; Osteoarthritis in her mother; Rheum arthritis in her father. Social History:  reports that she has never smoked. She has never used smokeless tobacco. She reports that she does not currently use alcohol. She reports that she does not use drugs. OB/GYN History:  OB History       Gravida 1   Para 1   Term 1   Preterm     AB     Living 1       SAB     IAB     Ectopic     Molar     Multiple     Live Births            Allergies: is allergic to amoxicillin, sulfamethoxazole-trimethoprim, rosuvastatin, sulfamethoxazole, trimethoprim, and cephalexin. Medications:  Current Medications   Current Outpatient Medications:    acetaminophen (TYLENOL) 650 MG ER tablet, Take 2 tablets (1,300 mg total) by mouth every 8 (eight) hours as needed, Disp: , Rfl:    baclofen (LIORESAL) 10 MG tablet, Take 10 mg by mouth every 12 (twelve) hours as needed, Disp: , Rfl:    BELBUCA 450 mcg Film, Place 450 mcg inside cheek 1-2 times daily, Disp: , Rfl:    busPIRone (BUSPAR) 10 MG tablet, Take 10 mg by mouth 3 (three) times daily, Disp: , Rfl:    comp.stocking,knee,regular,lrg Misc, Apply in the morning and remove at night., Disp: , Rfl:    diclofenac (VOLTAREN) 1 % topical gel, Apply 2 g topically every evening   , Disp: , Rfl:    DULoxetine (CYMBALTA) 60 MG DR capsule, Take 60 mg by mouth daily before lunch   , Disp: , Rfl:    estradioL (ESTRACE) 0.01 % (0.1  mg/gram) vaginal cream, Place 0.5 g vaginally twice a week, Disp: 42.5 g, Rfl: 3   ezetimibe (ZETIA) 10 mg tablet, Take 10 mg by mouth once daily, Disp: , Rfl:    famotidine (PEPCID) 20 MG tablet, Take 20 mg by mouth at bedtime, Disp: , Rfl:    fluticasone propionate (FLONASE) 50  mcg/actuation nasal spray, Place 1 spray into both nostrils once daily as needed   , Disp: , Rfl:    hydrOXYzine (ATARAX) 25 MG tablet, Take 1 tablet (25 mg total) by mouth as needed for Anxiety, Disp: , Rfl:    levonorgestreL (MIRENA 52 MG) IUD, Insert 1 each into the uterus once Follow package directions., Disp: , Rfl:    methocarbamoL (ROBAXIN) 500 MG tablet, as needed, Disp: , Rfl:    naloxone (NARCAN) 4 mg/actuation nasal spray, Place into one nostril, Disp: , Rfl:    nitrofurantoin (MACRODANTIN) 50 MG capsule, Take 50 mg by mouth once daily as needed   , Disp: , Rfl:    omeprazole (PRILOSEC) 40 MG DR capsule, Take by mouth daily, Disp: , Rfl:    ondansetron (ZOFRAN) 8 MG tablet, Take 8 mg by mouth once daily as needed, Disp: , Rfl:    oxyCODONE (ROXICODONE) 5 MG immediate release tablet, take 1 tablet by oral route  every 6 hours as needed, Disp: , Rfl:    oxyCODONE-acetaminophen (PERCOCET) 10-325 mg tablet, Take 1 tablet by mouth every 6 (six) hours as needed, Disp: , Rfl:    pantoprazole (PROTONIX) 40 MG DR tablet, Take 40 mg by mouth once daily, Disp: , Rfl:    pregabalin (LYRICA) 50 MG capsule, Take 100 mg by mouth 2 (two) times daily, Disp: , Rfl:    sucralfate (CARAFATE) 1 gram tablet, Take by mouth daily, Disp: , Rfl:    traMADoL (ULTRAM) 50 mg tablet, , Disp: , Rfl:    traZODone (DESYREL) 100 MG tablet, Take 100 mg by mouth at bedtime, Disp: , Rfl:    valACYclovir (VALTREX) 500 MG tablet, Take 2 tablets by mouth every day for 5 days as needed, Disp: 60 tablet, Rfl: 1   aspirin 81 MG chewable tablet, Take 1 tablet (81 mg total) by mouth once daily (Patient not taking: Reported on 10/20/2021), Disp: , Rfl:    azithromycin (ZITHROMAX) 500 MG tablet, as needed (Patient not taking: Reported on 10/20/2021), Disp: , Rfl:    cetirizine (ZYRTEC) 10 MG tablet, Take 10 mg by mouth as needed (Patient not taking: Reported on 10/20/2021), Disp: , Rfl:    clonazePAM (KLONOPIN) 1 MG tablet, , Disp: ,  Rfl:    dexlansoprazole (DEXILANT) 60 mg DR capsule, Take 60 mg by mouth at bedtime (Patient not taking: Reported on 08/13/2022), Disp: , Rfl:    linaCLOtide (LINZESS) 145 mcg capsule, Take 145 mcg by mouth once daily as needed    (Patient not taking: Reported on 10/20/2021), Disp: , Rfl:    melatonin 5 mg Tab, Take 5 mg by mouth at bedtime (Patient not taking: Reported on 10/20/2021), Disp: , Rfl:    phentermine (ADIPEX-P) 37.5 mg tablet, Take 1 tablet (37.5 mg total) by mouth every morning before breakfast (Patient not taking: Reported on 08/13/2022), Disp: , Rfl:    predniSONE (DELTASONE) 10 MG tablet, , Disp: , Rfl:    red yeast rice 600 mg Tab, Take 1,200 mg by mouth daily with dinner (Patient not taking: Reported on 10/20/2021), Disp: , Rfl:    rivaroxaban (XARELTO) 20 mg tablet, Take  10 mg by mouth once daily (Patient not taking: Reported on 11/07/2022), Disp: , Rfl:    rosuvastatin (CRESTOR) 5 MG tablet, Take 5 mg by mouth once daily (Patient not taking: Reported on 08/13/2022), Disp: , Rfl:    topiramate (TOPAMAX) 25 MG tablet, , Disp: , Rfl:    Current Facility-Administered Medications:    triamcinolone 0.1 % ointment, , Topical, As Directed, Stephan Minister, NP, Given at 07/05/21 1127    Review of Systems: No SOB, no palpitations or chest pain, no new lower extremity edema, no nausea or vomiting or bowel or bladder complaints. See HPI for gyn specific ROS.    Exam:   BP 101/68   Pulse 84   Ht 170.2 cm (5\' 7" )   Wt 98.4 kg (217 lb)   BMI 33.99 kg/m    General: Patient is well-groomed, well-nourished, appears stated age in no acute distress   HEENT: head is atraumatic and normocephalic, trachea is midline, neck is supple with no palpable nodules   CV: Regular rhythm and normal heart rate, no murmur   Pulm: Clear to auscultation throughout lung fields with no wheezing, crackles, or rhonchi. No increased work of breathing   Abdomen: soft , no mass, non-tender, no rebound  tenderness, no hepatomegaly     Impression:   The primary encounter diagnosis was Preop examination. A diagnosis of Intrauterine contraceptive device threads lost, subsequent encounter was also pertinent to this visit.   Plan:   1.  Preoperative visit: Hysteroscopy with IUD removal. Consents signed today.  -Risks of surgery were discussed with the patient including but not limited to: bleeding which may require transfusion; infection which may require antibiotics; injury to uterus or surrounding organs; intrauterine scarring which may impair future fertility; need for additional procedures including laparotomy or laparoscopy; and other postoperative/anesthesia complications. Written informed consent was obtained.   This is a scheduled same-day surgery. She will have a postop visit in 2 weeks to review operative findings and pathology.

## 2022-11-08 ENCOUNTER — Other Ambulatory Visit: Payer: Self-pay | Admitting: Orthopedic Surgery

## 2022-11-08 DIAGNOSIS — M25511 Pain in right shoulder: Secondary | ICD-10-CM

## 2022-11-08 DIAGNOSIS — Z96611 Presence of right artificial shoulder joint: Secondary | ICD-10-CM | POA: Insufficient documentation

## 2022-11-11 ENCOUNTER — Encounter: Payer: Self-pay | Admitting: Urgent Care

## 2022-11-13 ENCOUNTER — Other Ambulatory Visit: Payer: Self-pay

## 2022-11-13 ENCOUNTER — Encounter
Admission: RE | Admit: 2022-11-13 | Discharge: 2022-11-13 | Disposition: A | Discharge status: Home / Self Care | Payer: Medicare Other | Source: Ambulatory Visit | Attending: Obstetrics and Gynecology | Admitting: Obstetrics and Gynecology

## 2022-11-13 HISTORY — DX: Polyosteoarthritis, unspecified: M15.9

## 2022-11-13 HISTORY — DX: Chronic embolism and thrombosis of unspecified deep veins of lower extremity, bilateral: I82.503

## 2022-11-13 HISTORY — DX: Other abnormal glucose: R73.09

## 2022-11-13 NOTE — Telephone Encounter (Signed)
Received clearance from vascular, pt is to stop Xarelto 3 days prior and restart 1 day after procedure   Msg sent to pt  Clearance sent to be scanned

## 2022-11-13 NOTE — Patient Instructions (Addendum)
Your procedure is scheduled on: 5/16/ 2024  Report to the Registration Desk on the 1st floor of the [facility]. To find out your arrival time, please call [phone] between 1PM - 3PM on: 11/21/2022   If your arrival time is 6:00 am, do not arrive before that time as the [facility] entrance doors do not open until 6:00 am.  REMEMBER: Instructions that are not followed completely may result in serious medical risk, up to and including death; or upon the discretion of your surgeon and anesthesiologist your surgery may need to be rescheduled.  Do not eat food after midnight the night before surgery.  No gum chewing or hard candies.    One week prior to surgery: Stop Anti-inflammatories (NSAIDS) such as Advil, Aleve, Ibuprofen, Motrin, Naproxen, Naprosyn and Aspirin based products such as Excedrin, Goody's Powder, BC Powder. Stop ANY OVER THE COUNTER supplements until after surgery. You may however, continue to take Tylenol if needed for pain up until the day of surgery.  Continue taking all prescribed medications with the exception of the following:     XARELTO- ask your prescribing doctor for the recommendation regarding surgery . The pre op RN will ask you this. Remember the date of  last dose.   TAKE ONLY THESE MEDICATIONS THE MORNING OF SURGERY WITH A SIP OF WATER:  DULoxetine (CYMBALTA)  ezetimibe (ZETIA  3. omeprazole (PRILOSEC) -take one at night and in the am.  4. oxyCODONE-acetaminophen (PERCOCET)    No Alcohol for 24 hours before or after surgery.  No Smoking including e-cigarettes for 24 hours before surgery.  No chewable tobacco products for at least 6 hours before surgery.  No nicotine patches on the day of surgery.  Do not use any "recreational" drugs for at least a week (preferably 2 weeks) before your surgery.  Please be advised that the combination of cocaine and anesthesia may have negative outcomes, up to and including death. If you test positive for  cocaine, your surgery will be cancelled.  On the morning of surgery brush your teeth with toothpaste and water, you may rinse your mouth with mouthwash if you wish. Do not swallow any toothpaste or mouthwash.  You need to shower ton day of surgery .  Do not wear jewelry, make-up, hairpins, clips or nail polish.  Do not wear lotions, powders, or perfumes.   Do not shave body hair from the neck down 48 hours before surgery.  Contact lenses, hearing aids and dentures may not be worn into surgery.  Do not bring valuables to the hospital. [facility] is not responsible for any missing/lost belongings or valuables.   Notify your doctor if there is any change in your medical condition (cold, fever, infection).  Wear comfortable clothing (specific to your surgery type) to the hospital.  After surgery, you can help prevent lung complications by doing breathing exercises.  Take deep breaths and cough every 1-2 hours. Your doctor may order a device called an Incentive Spirometer to help you take deep breaths. If you are being admitted to the hospital overnight, leave your suitcase in the car. After surgery it may be brought to your room.  If you are being discharged the day of surgery, you will not be allowed to drive home. You will need a responsible individual to drive you home and stay with you for 24 hours after surgery.    Please call the Pre-admissions Testing Dept. at [phone] if you have any questions about these instructions.  Surgery Visitation Policy:  Patients having surgery or a procedure may have two visitors.  Children under the age of 68 must have an adult with them who is not the patient.

## 2022-11-14 ENCOUNTER — Ambulatory Visit
Admission: RE | Admit: 2022-11-14 | Discharge: 2022-11-14 | Disposition: A | Discharge status: Home / Self Care | Payer: Medicare Other | Source: Ambulatory Visit | Attending: Orthopedic Surgery | Admitting: Orthopedic Surgery

## 2022-11-14 ENCOUNTER — Inpatient Hospital Stay: Admission: RE | Admit: 2022-11-14 | Payer: Medicare Other | Source: Ambulatory Visit

## 2022-11-14 DIAGNOSIS — M25511 Pain in right shoulder: Secondary | ICD-10-CM

## 2022-11-16 ENCOUNTER — Encounter
Admission: RE | Admit: 2022-11-16 | Discharge: 2022-11-16 | Disposition: A | Discharge status: Home / Self Care | Payer: Medicare Other | Source: Ambulatory Visit | Attending: Obstetrics and Gynecology | Admitting: Obstetrics and Gynecology

## 2022-11-16 DIAGNOSIS — Y849 Medical procedure, unspecified as the cause of abnormal reaction of the patient, or of later complication, without mention of misadventure at the time of the procedure: Secondary | ICD-10-CM | POA: Diagnosis not present

## 2022-11-16 DIAGNOSIS — T8332XA Displacement of intrauterine contraceptive device, initial encounter: Secondary | ICD-10-CM | POA: Insufficient documentation

## 2022-11-16 DIAGNOSIS — Y768 Miscellaneous obstetric and gynecological devices associated with adverse incidents, not elsewhere classified: Secondary | ICD-10-CM | POA: Diagnosis not present

## 2022-11-16 DIAGNOSIS — Z01812 Encounter for preprocedural laboratory examination: Secondary | ICD-10-CM | POA: Insufficient documentation

## 2022-11-16 DIAGNOSIS — T8332XD Displacement of intrauterine contraceptive device, subsequent encounter: Secondary | ICD-10-CM

## 2022-11-16 LAB — CBC
HCT: 38.3 % (ref 36.0–46.0)
Hemoglobin: 12.5 g/dL (ref 12.0–15.0)
MCH: 30.4 pg (ref 26.0–34.0)
MCHC: 32.6 g/dL (ref 30.0–36.0)
MCV: 93.2 fL (ref 80.0–100.0)
Platelets: 338 10*3/uL (ref 150–400)
RBC: 4.11 MIL/uL (ref 3.87–5.11)
RDW: 12.6 % (ref 11.5–15.5)
WBC: 7.2 10*3/uL (ref 4.0–10.5)
nRBC: 0 % (ref 0.0–0.2)

## 2022-11-16 LAB — TYPE AND SCREEN
ABO/RH(D): B POS
Antibody Screen: NEGATIVE

## 2022-11-19 NOTE — Assessment & Plan Note (Signed)
Chronic.  BMI elevated greater than 33 with history of hypertension, dyslipidemia. Will obtain blood work today. Discussed with patient injectables on backorder and may not be covered for weight loss under insurance.  Referral sent to healthy weight and wellness clinic Referral nutrition consult

## 2022-11-22 ENCOUNTER — Ambulatory Visit: Payer: Medicare Other | Admitting: Anesthesiology

## 2022-11-22 ENCOUNTER — Ambulatory Visit
Admission: RE | Admit: 2022-11-22 | Discharge: 2022-11-22 | Disposition: A | Discharge status: Home / Self Care | Payer: Medicare Other | Attending: Obstetrics and Gynecology | Admitting: Obstetrics and Gynecology

## 2022-11-22 ENCOUNTER — Encounter
Admission: RE | Disposition: A | Discharge status: Home / Self Care | Payer: Self-pay | Source: Home / Self Care | Attending: Obstetrics and Gynecology

## 2022-11-22 ENCOUNTER — Other Ambulatory Visit: Payer: Self-pay

## 2022-11-22 ENCOUNTER — Encounter: Payer: Self-pay | Admitting: Obstetrics and Gynecology

## 2022-11-22 DIAGNOSIS — R519 Headache, unspecified: Secondary | ICD-10-CM | POA: Diagnosis not present

## 2022-11-22 DIAGNOSIS — Z86718 Personal history of other venous thrombosis and embolism: Secondary | ICD-10-CM | POA: Insufficient documentation

## 2022-11-22 DIAGNOSIS — X58XXXA Exposure to other specified factors, initial encounter: Secondary | ICD-10-CM | POA: Insufficient documentation

## 2022-11-22 DIAGNOSIS — E669 Obesity, unspecified: Secondary | ICD-10-CM | POA: Diagnosis not present

## 2022-11-22 DIAGNOSIS — F419 Anxiety disorder, unspecified: Secondary | ICD-10-CM | POA: Diagnosis not present

## 2022-11-22 DIAGNOSIS — T8332XD Displacement of intrauterine contraceptive device, subsequent encounter: Secondary | ICD-10-CM

## 2022-11-22 DIAGNOSIS — K449 Diaphragmatic hernia without obstruction or gangrene: Secondary | ICD-10-CM | POA: Diagnosis not present

## 2022-11-22 DIAGNOSIS — T8332XA Displacement of intrauterine contraceptive device, initial encounter: Secondary | ICD-10-CM | POA: Insufficient documentation

## 2022-11-22 DIAGNOSIS — G8929 Other chronic pain: Secondary | ICD-10-CM | POA: Insufficient documentation

## 2022-11-22 DIAGNOSIS — M1990 Unspecified osteoarthritis, unspecified site: Secondary | ICD-10-CM | POA: Diagnosis not present

## 2022-11-22 DIAGNOSIS — K219 Gastro-esophageal reflux disease without esophagitis: Secondary | ICD-10-CM | POA: Diagnosis not present

## 2022-11-22 DIAGNOSIS — F32A Depression, unspecified: Secondary | ICD-10-CM | POA: Insufficient documentation

## 2022-11-22 DIAGNOSIS — I739 Peripheral vascular disease, unspecified: Secondary | ICD-10-CM | POA: Insufficient documentation

## 2022-11-22 DIAGNOSIS — Z6833 Body mass index (BMI) 33.0-33.9, adult: Secondary | ICD-10-CM | POA: Diagnosis not present

## 2022-11-22 DIAGNOSIS — Z7901 Long term (current) use of anticoagulants: Secondary | ICD-10-CM | POA: Insufficient documentation

## 2022-11-22 HISTORY — PX: HYSTEROSCOPY: SHX211

## 2022-11-22 LAB — POCT PREGNANCY, URINE: Preg Test, Ur: NEGATIVE

## 2022-11-22 SURGERY — HYSTEROSCOPY
Anesthesia: General | Site: Vagina

## 2022-11-22 MED ORDER — FENTANYL CITRATE (PF) 100 MCG/2ML IJ SOLN
25.0000 ug | INTRAMUSCULAR | Status: DC | PRN
  Administered 2022-11-22: 25 ug via INTRAVENOUS

## 2022-11-22 MED ORDER — LACTATED RINGERS IV SOLN: INTRAVENOUS | Status: DC

## 2022-11-22 MED ORDER — DROPERIDOL 2.5 MG/ML IJ SOLN: 0.6250 mg | Freq: Once | INTRAMUSCULAR | Status: DC | PRN

## 2022-11-22 MED ORDER — LIDOCAINE HCL (CARDIAC) PF 100 MG/5ML IV SOSY
PREFILLED_SYRINGE | INTRAVENOUS | Status: DC | PRN
  Administered 2022-11-22: 50 mg via INTRAVENOUS

## 2022-11-22 MED ORDER — PROPOFOL 500 MG/50ML IV EMUL
INTRAVENOUS | Status: DC | PRN
  Administered 2022-11-22: 50 ug/kg/min via INTRAVENOUS

## 2022-11-22 MED ORDER — CHLORHEXIDINE GLUCONATE 0.12 % MT SOLN
15.0000 mL | Freq: Once | OROMUCOSAL | Status: AC
  Administered 2022-11-22: 15 mL via OROMUCOSAL

## 2022-11-22 MED ORDER — OXYCODONE HCL 5 MG PO TABS
5.0000 mg | ORAL_TABLET | Freq: Once | ORAL | Status: AC | PRN
  Administered 2022-11-22: 5 mg via ORAL

## 2022-11-22 MED ORDER — DEXAMETHASONE SODIUM PHOSPHATE 10 MG/ML IJ SOLN
INTRAMUSCULAR | Status: DC | PRN
  Administered 2022-11-22: 10 mg via INTRAVENOUS

## 2022-11-22 MED ORDER — ONDANSETRON HCL 4 MG/2ML IJ SOLN
INTRAMUSCULAR | Status: DC | PRN
  Administered 2022-11-22: 8 mg via INTRAVENOUS

## 2022-11-22 MED ORDER — ONDANSETRON HCL 4 MG/2ML IJ SOLN
INTRAMUSCULAR | Status: AC
  Filled 2022-11-22: qty 2

## 2022-11-22 MED ORDER — PROPOFOL 10 MG/ML IV BOLUS
INTRAVENOUS | Status: DC | PRN
  Administered 2022-11-22: 200 mg via INTRAVENOUS

## 2022-11-22 MED ORDER — PROMETHAZINE HCL 25 MG/ML IJ SOLN: 6.2500 mg | INTRAMUSCULAR | Status: DC | PRN

## 2022-11-22 MED ORDER — FENTANYL CITRATE (PF) 100 MCG/2ML IJ SOLN
INTRAMUSCULAR | Status: DC | PRN
  Administered 2022-11-22 (×2): 50 ug via INTRAVENOUS

## 2022-11-22 MED ORDER — ACETAMINOPHEN 10 MG/ML IV SOLN
INTRAVENOUS | Status: AC
  Filled 2022-11-22: qty 100

## 2022-11-22 MED ORDER — POVIDONE-IODINE 10 % EX SWAB
2.0000 | Freq: Once | CUTANEOUS | Status: AC
  Administered 2022-11-22: 2 via TOPICAL

## 2022-11-22 MED ORDER — MIDAZOLAM HCL 2 MG/2ML IJ SOLN
INTRAMUSCULAR | Status: DC | PRN
  Administered 2022-11-22: 2 mg via INTRAVENOUS

## 2022-11-22 MED ORDER — CHLORHEXIDINE GLUCONATE 0.12 % MT SOLN
OROMUCOSAL | Status: AC
  Filled 2022-11-22: qty 15

## 2022-11-22 MED ORDER — LIDOCAINE HCL (PF) 2 % IJ SOLN
INTRAMUSCULAR | Status: AC
  Filled 2022-11-22: qty 5

## 2022-11-22 MED ORDER — PROPOFOL 1000 MG/100ML IV EMUL
INTRAVENOUS | Status: AC
  Filled 2022-11-22: qty 100

## 2022-11-22 MED ORDER — ORAL CARE MOUTH RINSE: 15.0000 mL | Freq: Once | OROMUCOSAL | Status: AC

## 2022-11-22 MED ORDER — FENTANYL CITRATE (PF) 100 MCG/2ML IJ SOLN
INTRAMUSCULAR | Status: AC
  Filled 2022-11-22: qty 2

## 2022-11-22 MED ORDER — SILVER NITRATE-POT NITRATE 75-25 % EX MISC
CUTANEOUS | Status: AC
  Filled 2022-11-22: qty 10

## 2022-11-22 MED ORDER — SODIUM CHLORIDE 0.9 % IR SOLN
Status: DC | PRN
  Administered 2022-11-22: 1

## 2022-11-22 MED ORDER — ACETAMINOPHEN 10 MG/ML IV SOLN
INTRAVENOUS | Status: DC | PRN
  Administered 2022-11-22: 1000 mg via INTRAVENOUS

## 2022-11-22 MED ORDER — OXYCODONE HCL 5 MG PO TABS
ORAL_TABLET | ORAL | Status: AC
  Filled 2022-11-22: qty 1

## 2022-11-22 MED ORDER — PROPOFOL 10 MG/ML IV BOLUS
INTRAVENOUS | Status: AC
  Filled 2022-11-22: qty 20

## 2022-11-22 MED ORDER — DEXAMETHASONE SODIUM PHOSPHATE 10 MG/ML IJ SOLN
INTRAMUSCULAR | Status: AC
  Filled 2022-11-22: qty 1

## 2022-11-22 MED ORDER — ACETAMINOPHEN 10 MG/ML IV SOLN: 1000.0000 mg | Freq: Once | INTRAVENOUS | Status: DC | PRN

## 2022-11-22 MED ORDER — KETOROLAC TROMETHAMINE 30 MG/ML IJ SOLN
INTRAMUSCULAR | Status: DC | PRN
  Administered 2022-11-22: 30 mg via INTRAVENOUS

## 2022-11-22 MED ORDER — MIDAZOLAM HCL 2 MG/2ML IJ SOLN
INTRAMUSCULAR | Status: AC
  Filled 2022-11-22: qty 2

## 2022-11-22 MED ORDER — OXYCODONE HCL 5 MG/5ML PO SOLN: 5.0000 mg | Freq: Once | ORAL | Status: AC | PRN

## 2022-11-22 MED ORDER — KETOROLAC TROMETHAMINE 30 MG/ML IJ SOLN
INTRAMUSCULAR | Status: AC
  Filled 2022-11-22: qty 1

## 2022-11-22 SURGICAL SUPPLY — 12 items
BAG PRESSURE INF REUSE 1000 (BAG) ×1 IMPLANT
GLOVE BIO SURGEON STRL SZ7 (GLOVE) ×1 IMPLANT
GLOVE INDICATOR 7.5 STRL GRN (GLOVE) ×1 IMPLANT
GOWN STRL REUS W/ TWL LRG LVL3 (GOWN DISPOSABLE) ×2 IMPLANT
GOWN STRL REUS W/TWL LRG LVL3 (GOWN DISPOSABLE) ×3
IV NS IRRIG 3000ML ARTHROMATIC (IV SOLUTION) ×1 IMPLANT
KIT TURNOVER CYSTO (KITS) ×1 IMPLANT
PACK DNC HYST (MISCELLANEOUS) ×1 IMPLANT
PAD PREP OB/GYN DISP 24X41 (PERSONAL CARE ITEMS) ×1 IMPLANT
SCRUB CHG 4% DYNA-HEX 4OZ (MISCELLANEOUS) ×1 IMPLANT
SEAL ROD LENS SCOPE MYOSURE (ABLATOR) ×1 IMPLANT
SET CYSTO W/LG BORE CLAMP LF (SET/KITS/TRAYS/PACK) IMPLANT

## 2022-11-22 NOTE — Anesthesia Postprocedure Evaluation (Signed)
Anesthesia Post Note  Patient: Maureen Randall  Procedure(s) Performed: HYSTEROSCOPY, IUD REMOVAL (Vagina )  Patient location during evaluation: PACU Anesthesia Type: General Level of consciousness: awake and alert Pain management: pain level controlled Vital Signs Assessment: post-procedure vital signs reviewed and stable Respiratory status: spontaneous breathing, nonlabored ventilation and respiratory function stable Cardiovascular status: blood pressure returned to baseline and stable Postop Assessment: no apparent nausea or vomiting Anesthetic complications: no   No notable events documented.   Last Vitals:  Vitals:   11/22/22 0908 11/22/22 0922  BP: (!) 127/97 (!) 140/93  Pulse: 73 68  Resp: 19 18  Temp: 36.7 C (!) 36.1 C  SpO2: 100% 100%    Last Pain:  Vitals:   11/22/22 0922  TempSrc: Temporal  PainSc: 4                  Foye Deer

## 2022-11-22 NOTE — Interval H&P Note (Signed)
History and Physical Interval Note:  11/22/2022 7:53 AM  Maureen Randall  has presented today for surgery, with the diagnosis of lost IUD strings.  The various methods of treatment have been discussed with the patient and family. After consideration of risks, benefits and other options for treatment, the patient has consented to  Procedure(s): HYSTEROSCOPY, IUD REMOVAL (N/A) as a surgical intervention.  The patient's history has been reviewed, patient examined, no change in status, stable for surgery.  I have reviewed the patient's chart and labs.  Questions were answered to the patient's satisfaction.     Christeen Douglas

## 2022-11-22 NOTE — Transfer of Care (Signed)
Immediate Anesthesia Transfer of Care Note  Patient: [name]  Procedure(s) Performed: HYSTEROSCOPY, IUD REMOVAL (Vagina )  Patient Location: PACU  Anesthesia Type:General  Level of Consciousness: drowsy and patient cooperative  Airway & Oxygen Therapy: Patient Spontanous Breathing and Patient connected to face mask oxygen  Post-op Assessment: Report given to RN and Post -op Vital signs reviewed and stable  Post vital signs: Reviewed and stable  Last Vitals:  Vitals Value Taken Time  BP 147/85 11/22/22 0836  Temp    Pulse 76 11/22/22 0837  Resp 24 11/22/22 0837  SpO2 100 % 11/22/22 0837  Vitals shown include unvalidated device data.  Last Pain:  Vitals:   11/22/22 0621  TempSrc: Temporal  PainSc: 7          Complications: No notable events documented.

## 2022-11-22 NOTE — Discharge Instructions (Addendum)
Discharge instructions after a hysteroscopy with dilation  Signs and Symptoms to Report  Call our office at [phone] if you have any of the following:    Fever over 100.4 degrees or higher  Severe stomach pain not relieved with pain medications  Bright red bleeding that's heavier than a period that does not slow with rest after the first 24 hours  To go the bathroom a lot (frequency), you can't hold your urine (urgency), or it hurts when you empty your bladder (urinate)  Chest pain  Shortness of breath  Pain in the calves of your legs  Severe nausea and vomiting not relieved with anti-nausea medications  Any concerns  What You Can Expect after Surgery  You may see some pink tinged, bloody fluid. This is normal. You may also have cramping for several days.   Activities after Your Discharge Follow these guidelines to help speed your recovery at home:  Don't drive if you are in pain or taking narcotic pain medicine. You may drive when you can safely slam on the brakes, turn the wheel forcefully, and rotate your torso comfortably. This is typically 4-7 days. Practice in a parking lot or side street prior to attempting to drive regularly.   Ask others to help with household chores for 4 weeks.  Don't do strenuous activities, exercises, or sports like vacuuming, tennis, squash, etc. until your doctor says it is safe to do so.  Walk as you feel able. Rest often since it may take a week or two for your energy level to return to normal.   You may climb stairs  Avoid constipation:   -Eat fruits, vegetables, and whole grains. Eat small meals as your appetite will take time to return to normal.   -Drink 6 to 8 glasses of water each day unless your doctor has told you to limit your fluids.   -Use a laxative or stool softener as needed if constipation becomes a problem. You may take Miralax, metamucil, Citrucil, Colace, Senekot, FiberCon, etc. If this does not relieve the constipation, try two  tablespoons of Milk Of Magnesia every 8 hours until your bowels move.   You may shower.   Do not get in a hot tub, swimming pool, etc. until your doctor agrees.  Do not douche, use tampons, or have sex until your doctor says it is okay, usually about 2 weeks.  Take your pain medicine when you need it. The medicine may not work as well if the pain is bad.  Take the medicines you were taking before surgery. Other medications you might need are pain medications (ibuprofen), medications for constipation (Colace) and nausea medications (Zofran).        AMBULATORY SURGERY  DISCHARGE INSTRUCTIONS   The drugs that you were given will stay in your system until tomorrow so for the next 24 hours you should not:  Drive an automobile Make any legal decisions Drink any alcoholic beverage   You may resume regular meals tomorrow.  Today it is better to start with liquids and gradually work up to solid foods.  You may eat anything you prefer, but it is better to start with liquids, then soup and crackers, and gradually work up to solid foods.   Please notify your doctor immediately if you have any unusual bleeding, trouble breathing, redness and pain at the surgery site, drainage, fever, or pain not relieved by medication.     Your post-operative visit with Dr.  is: Date:                        Time:    Please call to schedule your post-operative visit.  Additional Instructions:

## 2022-11-22 NOTE — Anesthesia Procedure Notes (Signed)
Procedure Name: LMA Insertion Date/Time: 11/22/2022 8:03 AM  Performed by: Jeannene Patella, CRNAPre-anesthesia Checklist: Emergency Drugs available, Patient identified, Suction available, Patient being monitored and Timeout performed Patient Re-evaluated:Patient Re-evaluated prior to induction Oxygen Delivery Method: Circle system utilized Preoxygenation: Pre-oxygenation with 100% oxygen Induction Type: IV induction LMA: LMA inserted LMA Size: 4.0 Number of attempts: 1 Placement Confirmation: positive ETCO2 and breath sounds checked- equal and bilateral Tube secured with: Tape Dental Injury: Teeth and Oropharynx as per pre-operative assessment  Comments: Soft gauze roll left molars

## 2022-11-22 NOTE — Anesthesia Preprocedure Evaluation (Addendum)
Anesthesia Evaluation  Patient identified by MRN, date of birth, ID band Patient awake    Reviewed: Allergy & Precautions, NPO status , Patient's Chart, lab work & pertinent test results  History of Anesthesia Complications (+) PONV and history of anesthetic complications  Airway Mallampati: I  TM Distance: >3 FB Neck ROM: Full    Dental no notable dental hx. (+) Dental Advisory Given, Teeth Intact   Pulmonary neg pulmonary ROS   Pulmonary exam normal breath sounds clear to auscultation       Cardiovascular + Peripheral Vascular Disease and + DVT  Normal cardiovascular exam Rhythm:Regular Rate:Normal  01/2019 Echo  1. The left ventricle has normal systolic function, with an ejection  fraction of 55-60%. The cavity size was normal. Left ventricular diastolic  parameters were normal. No evidence of left ventricular regional wall  motion abnormalities.   2. The right ventricle has normal systolic function. The cavity was  normal. There is no increase in right ventricular wall thickness. Right  ventricular systolic pressure could not be assessed.   3. Trivial pericardial effusion is present.   4. The mitral valve is grossly normal.   5. The aortic valve was not well visualized.   6. The aortic root is normal in size and structure.   7. The interatrial septum was not well visualized.    Neuro/Psych  Headaches PSYCHIATRIC DISORDERS Anxiety Depression    Chronic pain and chronic opioid use   GI/Hepatic hiatal hernia,GERD  ,,  Endo/Other    Renal/GU      Musculoskeletal  (+) Arthritis ,    Abdominal  (+) + obese (BMI:33.2)  Peds  Hematology   Anesthesia Other Findings All: sulfa, amoxicillin, keflex, crestor  Reproductive/Obstetrics                              Anesthesia Physical Anesthesia Plan  ASA: 2  Anesthesia Plan: General   Post-op Pain Management: Ofirmev IV (intra-op)* and  Toradol IV (intra-op)*   Induction: Intravenous  PONV Risk Score and Plan: 4 or greater and Midazolam, Ondansetron, Treatment may vary due to age or medical condition, Dexamethasone and Propofol infusion  Airway Management Planned: LMA  Additional Equipment: None  Intra-op Plan:   Post-operative Plan: Extubation in OR  Informed Consent: I have reviewed the patients History and Physical, chart, labs and discussed the procedure including the risks, benefits and alternatives for the proposed anesthesia with the patient or authorized representative who has indicated his/her understanding and acceptance.     Dental advisory given  Plan Discussed with: CRNA  Anesthesia Plan Comments:          Anesthesia Quick Evaluation

## 2022-11-22 NOTE — OR Nursing (Signed)
IUD removed and confirmed intact by Dr Dalbert Garnet

## 2022-11-22 NOTE — Op Note (Signed)
PROCEDURE DATE: 07/15/2014  PREOPERATIVE DIAGNOSES: Lost IUD strings POSTOPERATIVE DIAGNOSES: The same PROCEDURE: Cervical Dilation,  IUD Removal under anesthesia, hysteroscopy SURGEON:  Dr. Christeen Douglas ANESTHESIOLOGIST:  Foye Deer, MD Anesthesiologist: Foye Deer, MD CRNA: Jeannene Patella, CRNA   INDICATIONS: 57 y.o. F here for IUD removal under anesthesia secondary to lost strings. The risks of surgery were discussed in detail with the patient including but not limited to: bleeding; infection which may require antibiotics; injury to uterus or surrounding organs which may involve bowel, bladder, ureters ; need for additional procedures including laparoscopy or laparotomy; thromboembolic phenomenon, surgical site problems and other postoperative/anesthesia complications. Written informed consent was obtained.    FINDINGS:  Small uterus, normal vagina and cervix. IUD in the endometrial canal, strings entirely at the fundus.  ANESTHESIA:   LMA ESTIMATED BLOOD LOSS: 5 ml COMPLICATIONS: None immediate   Hysteroscopic IUD removal procedure:   The patient  was then taken to the operating room where LMA was administered and was found to be adequate.  She was placed in the dorsal lithotomy position, and was prepped and draped in a sterile manner. After an adequate timeout was performed, attention was turned to her pelvis where a speculum was placed in the vagina.  The cervix was visualized and grasped anteriorly with a single tooth tenaculum.   Her cervix was serially dilated to 16 [place] using Hanks dilators. The hysteroscope was introduced to reveal the IUD, and a hysteroscopic grasper used to grasp the IUD stem. The hysteroscope, grasper and IUD were all removed as a single unit. Pictures were taken of the endometrial canal and cervix.    The tenaculum was removed from the anterior lip of the cervix and the vaginal speculum was removed. Cervix with good  hemostasis.  All instruments were removed from the patient's vagina.  Sponge and instrument counts were correct.  The patient tolerated the procedure well and was taken to the recovery area awake, and in stable condition.   The patient will be discharged to home as per PACU criteria.  She was prescribed Ibuprofen for pain. Routine postoperative instructions given.  She will follow up in clinic as needed. She will not need contraception, but we will check FSH/E2 to confirm for peace of mind.

## 2022-11-23 ENCOUNTER — Encounter: Payer: Self-pay | Admitting: Obstetrics and Gynecology

## 2022-11-26 ENCOUNTER — Telehealth: Payer: Self-pay

## 2022-11-26 ENCOUNTER — Encounter: Payer: Self-pay | Admitting: Gastroenterology

## 2022-11-26 NOTE — Telephone Encounter (Signed)
Pt lmovm stating that her Sx have improved since OV with medication... Also, she had a procedure last week with OB GYN and she now believes she has a UTI and had some difficulties....   Pt would like to know if she should postpone EGD Bravo pH in the meantime due to improvement in Sx and still dealing with complications following OB GYN procedure  Please advise

## 2022-11-28 ENCOUNTER — Encounter (INDEPENDENT_AMBULATORY_CARE_PROVIDER_SITE_OTHER): Payer: Self-pay | Admitting: Family Medicine

## 2022-11-28 ENCOUNTER — Ambulatory Visit (INDEPENDENT_AMBULATORY_CARE_PROVIDER_SITE_OTHER): Payer: Medicare Other | Admitting: Family Medicine

## 2022-11-28 VITALS — BP 118/78 | HR 70 | Temp 98.3°F | Ht 66.0 in | Wt 207.0 lb

## 2022-11-28 DIAGNOSIS — R5383 Other fatigue: Secondary | ICD-10-CM | POA: Diagnosis not present

## 2022-11-28 DIAGNOSIS — K219 Gastro-esophageal reflux disease without esophagitis: Secondary | ICD-10-CM

## 2022-11-28 DIAGNOSIS — E785 Hyperlipidemia, unspecified: Secondary | ICD-10-CM | POA: Diagnosis not present

## 2022-11-28 DIAGNOSIS — Z1331 Encounter for screening for depression: Secondary | ICD-10-CM

## 2022-11-28 DIAGNOSIS — F411 Generalized anxiety disorder: Secondary | ICD-10-CM

## 2022-11-28 DIAGNOSIS — E669 Obesity, unspecified: Secondary | ICD-10-CM

## 2022-11-28 DIAGNOSIS — R0602 Shortness of breath: Secondary | ICD-10-CM | POA: Diagnosis not present

## 2022-11-28 DIAGNOSIS — R739 Hyperglycemia, unspecified: Secondary | ICD-10-CM | POA: Diagnosis not present

## 2022-11-28 DIAGNOSIS — E559 Vitamin D deficiency, unspecified: Secondary | ICD-10-CM

## 2022-11-28 DIAGNOSIS — Z6833 Body mass index (BMI) 33.0-33.9, adult: Secondary | ICD-10-CM

## 2022-11-28 DIAGNOSIS — Z86718 Personal history of other venous thrombosis and embolism: Secondary | ICD-10-CM

## 2022-11-28 NOTE — Progress Notes (Signed)
Chief Complaint:   OBESITY AKESHIA Randall (MR# [id number]) is a 57 y.o. female who presents for evaluation and treatment of obesity and related comorbidities. Current BMI is Body mass index is 33.41 kg/m. [name] has been struggling with her weight for many years and has been unsuccessful in either losing weight, maintaining weight loss, or reaching her healthy weight goal.  [name] is currently in the action stage of change and ready to dedicate time achieving and maintaining a healthier weight. [name] is interested in becoming our patient and working on intensive lifestyle modifications including (but not limited to) diet and exercise for weight loss.  Referred by Dr. Clent Ridges.  Patient has had many orthopedic surgeries and has is not interested in weight loss surgery.  She recognizes that she struggles with food intake and knowing what and how much to eat. Lives at home with her husband Maureen Randall and he is supportive of her.  She is walking 3-4x a week for about 30 min. Desired weight is 180lbs; last time she was this weight was 2012.  Thinks her weight gain started due to recurrent orthopedic surgeries and pain and her eating habits.  Currently fasts and eats only about 1x a day. Eats out 2-3x a week at [facility] or other [employer] in [place]. Husband does not like vegetables or fruit.  She does not care much for cooking. Skips breakfast almost daily.  Food recall: skips breakfast but very rarely may do a Fiber One 70 calorie bar or apple cinnamon bar.  Early dinner between 12-3pm Eats whatever she wants- pork chops (1) or taco bake with ground beef, enchilada sauce with pasta.  Drinks water 90% of day with occasionally a pepsi.  Wants a sweet after meal- chocolate chip cookies from [employer]. If she gets hungry she tries to snack on fruit- watermelon, grapes, strawberries.  Tries to stop eating at 7pm due to GERD.   Sona's habits were reviewed today and are as follows: Her  family eats meals together, she thinks her family will eat healthier with her, her desired weight loss is 27 lbs, she started gaining weight in 2015, her heaviest weight ever was 222 pounds, she has significant food cravings issues, she skips meals frequently, she is frequently drinking liquids with calories, she frequently makes poor food choices, she frequently eats larger portions than normal.  Depression Screen [name]'s Food and Mood (modified PHQ-9) score was 4.  Subjective:   1. Other fatigue [name] admits to daytime somnolence and admits to waking up still tired. Patient has a history of symptoms of daytime fatigue and morning fatigue. [name] generally gets 6 hours of sleep per night, and states that she has nightime awakenings. Snoring is not present. Apneic episodes are not present. Epworth Sleepiness Score is 4.  EKG on 09/10/2022 was reviewed; normal sinus rhythm with RSR V1 and V2.  2. SOBOE (shortness of breath on exertion) [name] notes increasing shortness of breath with exercising and seems to be worsening over time with weight gain. She notes getting out of breath sooner with activity than she used to. This has not gotten worse recently. [name] denies shortness of breath at rest or orthopnea.  3. Hyperglycemia Patient's last A1c was 5.6.  Elevated blood sugar frequently in the past.  4. Hyperlipidemia, unspecified hyperlipidemia type Patient sees Dr. Rennis Golden for management.  She is on ezetimibe, and she could not tolerate statins.  5. History of blood clots Patient sees [facility] in  [place].  She is on Xarelto 10 mg daily.  6. Vitamin D deficiency Patient's diagnosis is likely given obesity.  She is not on supplementation.  7. Gastroesophageal reflux disease without esophagitis Patient has a GI doctor.  Her symptoms are better managed when she stops eating a few hours before bed.  8. GAD (generalized anxiety disorder) Patient feels better controlled today now that she is on  Medicare and disability.  Assessment/Plan:   1. Other fatigue [name] does feel that her weight is causing her energy to be lower than it should be. Fatigue may be related to obesity, depression or many other causes. Labs will be ordered, and in the meanwhile, [name] will focus on self care including making healthy food choices, increasing physical activity and focusing on stress reduction.  - T3 - T4, free  2. SOBOE (shortness of breath on exertion) [name] does feel that she gets out of breath more easily that she used to when she exercises. [name]'s shortness of breath appears to be obesity related and exercise induced. She has agreed to work on weight loss and gradually increase exercise to treat her exercise induced shortness of breath. Will continue to monitor closely.  3. Hyperglycemia We will check labs today, and we will follow-up at her next appointment.  - Insulin, random  4. Hyperlipidemia, unspecified hyperlipidemia type We will check labs today, we will follow-up at her next appointment.  - Lipid Panel With LDL/HDL Ratio  5. History of blood clots We will repeat labs in 4 to 6 months.  Patient will follow-up with vascular at normal follow-up scheduled appointment.  6. Vitamin D deficiency We will check labs today, we will follow-up at her next appointment.  - VITAMIN D 25 Hydroxy (Vit-D Deficiency, Fractures)  7. Gastroesophageal reflux disease without esophagitis We will follow-up on her symptoms as patient changes food intake.  8. GAD (generalized anxiety disorder) Patient is to continue Cymbalta and hydroxyzine.  9. Depression screening [name] had a negative depression screening.   10. Class 1 obesity with serious comorbidity and body mass index (BMI) of 33.0 to 33.9 in adult, unspecified obesity type [name] is currently in the action stage of change and her goal is to continue with weight loss efforts. I recommend [name] begin the structured treatment plan as  follows:  She has agreed to the Category 3 Plan.  Exercise goals: No exercise has been prescribed at this time.   Behavioral modification strategies: increasing lean protein intake, meal planning and cooking strategies, keeping healthy foods in the home, and planning for success.  She was informed of the importance of frequent follow-up visits to maximize her success with intensive lifestyle modifications for her multiple health conditions. She was informed we would discuss her lab results at her next visit unless there is a critical issue that needs to be addressed sooner. [name] agreed to keep her next visit at the agreed upon time to discuss these results.  Objective:   Blood pressure 118/78, pulse 70, temperature 98.3 F (36.8 C), height 5\' 6"  (1.676 m), weight 207 lb (93.9 kg), SpO2 97 %. Body mass index is 33.41 kg/m.  EKG: Normal sinus rhythm, rate 73 BPM.  Indirect Calorimeter completed today shows a VO2 of 295 and a REE of 2030.  Her calculated basal metabolic rate is 1610 thus her basal metabolic rate is better than expected.  General: Cooperative, alert, well developed, in no acute distress. HEENT: Conjunctivae and lids unremarkable. Cardiovascular: Regular rhythm.  Lungs: Normal work of  breathing. Neurologic: No focal deficits.   Lab Results  Component Value Date   CREATININE 0.77 11/05/2022   BUN 18 11/05/2022   NA 140 11/05/2022   K 4.1 11/05/2022   CL 103 11/05/2022   CO2 28 11/05/2022   Lab Results  Component Value Date   ALT 17 11/05/2022   AST 19 11/05/2022   ALKPHOS 87 11/05/2022   BILITOT 0.5 11/05/2022   Lab Results  Component Value Date   HGBA1C 5.6 11/05/2022   HGBA1C 5.5 12/14/2021   HGBA1C 5.5 09/25/2019   HGBA1C 5.7 01/05/2019   No results found for: "INSULIN" Lab Results  Component Value Date   TSH 3.04 11/05/2022   Lab Results  Component Value Date   CHOL 176 07/10/2022   HDL 61 07/10/2022   LDLCALC 96 07/10/2022   TRIG 106  07/10/2022   CHOLHDL 2.9 07/10/2022   Lab Results  Component Value Date   WBC 7.2 11/16/2022   HGB 12.5 11/16/2022   HCT 38.3 11/16/2022   MCV 93.2 11/16/2022   PLT 338 11/16/2022   Lab Results  Component Value Date   IRON 77 09/25/2019   TIBC 336 09/25/2019   FERRITIN 37 09/25/2019   Attestation Statements:   Reviewed by clinician on day of visit: allergies, medications, problem list, medical history, surgical history, family history, social history, and previous encounter notes.  Time spent on visit including pre-visit chart review and post-visit charting and care was 45 minutes.   I, Burt Knack, am acting as transcriptionist for Reuben Likes, MD.  I have reviewed the above documentation for accuracy and completeness, and I agree with the above. - Reuben Likes, MD

## 2022-11-28 NOTE — Telephone Encounter (Signed)
Please advise 

## 2022-11-29 ENCOUNTER — Ambulatory Visit: Admit: 2022-11-29 | Payer: Medicare Other | Admitting: Gastroenterology

## 2022-11-29 LAB — VITAMIN D 25 HYDROXY (VIT D DEFICIENCY, FRACTURES): Vit D, 25-Hydroxy: 59.4 ng/mL (ref 30.0–100.0)

## 2022-11-29 LAB — LIPID PANEL WITH LDL/HDL RATIO
Cholesterol, Total: 218 mg/dL — ABNORMAL HIGH (ref 100–199)
HDL: 70 mg/dL (ref >39)
LDL Chol Calc (NIH): 129 mg/dL — ABNORMAL HIGH (ref 0–99)
LDL/HDL Ratio: 1.8 ratio (ref 0.0–3.2)
Triglycerides: 106 mg/dL (ref 0–149)
VLDL Cholesterol Cal: 19 mg/dL (ref 5–40)

## 2022-11-29 LAB — INSULIN, RANDOM: INSULIN: 13.7 u[IU]/mL (ref 2.6–24.9)

## 2022-11-29 LAB — T3: T3, Total: 119 ng/dL (ref 71–180)

## 2022-11-29 LAB — T4, FREE: Free T4: 1.17 ng/dL (ref 0.82–1.77)

## 2022-11-29 SURGERY — PH MONITORING, ESOPHAGUS, WIRELESS
Anesthesia: General

## 2022-12-12 ENCOUNTER — Ambulatory Visit (INDEPENDENT_AMBULATORY_CARE_PROVIDER_SITE_OTHER): Payer: Medicare Other | Admitting: Family Medicine

## 2022-12-12 ENCOUNTER — Ambulatory Visit: Payer: Medicare Other | Admitting: Family Medicine

## 2022-12-12 ENCOUNTER — Encounter (INDEPENDENT_AMBULATORY_CARE_PROVIDER_SITE_OTHER): Payer: Self-pay | Admitting: Family Medicine

## 2022-12-12 VITALS — BP 110/73 | HR 63 | Temp 98.3°F | Ht 66.0 in | Wt 204.0 lb

## 2022-12-12 DIAGNOSIS — R7303 Prediabetes: Secondary | ICD-10-CM

## 2022-12-12 DIAGNOSIS — E559 Vitamin D deficiency, unspecified: Secondary | ICD-10-CM

## 2022-12-12 DIAGNOSIS — E669 Obesity, unspecified: Secondary | ICD-10-CM | POA: Diagnosis not present

## 2022-12-12 DIAGNOSIS — Z6832 Body mass index (BMI) 32.0-32.9, adult: Secondary | ICD-10-CM

## 2022-12-12 DIAGNOSIS — E7849 Other hyperlipidemia: Secondary | ICD-10-CM | POA: Diagnosis not present

## 2022-12-12 DIAGNOSIS — Z6833 Body mass index (BMI) 33.0-33.9, adult: Secondary | ICD-10-CM

## 2022-12-12 NOTE — Progress Notes (Unsigned)
Chief Complaint:   OBESITY Maureen Randall is here to discuss her progress with her obesity treatment plan along with follow-up of her obesity related diagnoses. Maureen Randall is on the Category 3 Plan and states she is following her eating plan approximately 75-80% of the time. Maureen Randall states she is walking 2 miles for 30-40 minutes 5-6 times per week.  Today's visit was #: 2 Starting weight: 207 lbs Starting date: 11/28/2022 Today's weight: 204 lbs Today's date: 12/12/2022 Total lbs lost to date: 3 Total lbs lost since last in-office visit: 3  Interim History: Patient didn't feel the plan was as bad as she thought.  She felt it was very expensive the first few weeks.  She feels bored with the plan.  She is over hardboiled eggs and has incorporated Location manager sausages as a substitute for the milk.  She is using some of the skinny girl dressings and jam.  She is eating sandwiches for lunch option.  Has been eating pears and wants to start eating watermelon.  Her husband is doing the program with her.  She is mentions that she may be interested in other options for the protein. Got 100 calorie snacks and sometimes she doesn't eat them.   Subjective:   1. Vitamin D deficiency Surprisingly the patient is not on vitamin D supplementation but her level is close to goal at 59.4.  She denies nausea, vomiting, or muscle weakness but notes fatigue.  2. Other hyperlipidemia Patient LDL was 129, HDL 70, and triglycerides 161.  She is on Zetia (could not tolerate statin).  3. Prediabetes Patient is A1c is 5.6 and insulin 14.7.  She is not on medications.  She still has some carbohydrate cravings but better controlled.  Assessment/Plan:   1. Vitamin D deficiency We will repeat fasting labs in 3 months; likely with some vitamin D absorption from the sun she will be at goal.  2. Other hyperlipidemia Patient will continue her medications with no change in medication.  We will repeat fasting labs in 3 to 4  months.  3. Prediabetes Pathophysiology of insulin resistance, prediabetes, and diabetes mellitus were discussed with the patient today.  No medications today, and we will follow-up on carbohydrate cravings and hunger at her next appointment.  4. BMI 33.0-33.9,adult  5. Obesity with starting BMI of 33.5 Aemilia is currently in the action stage of change. As such, her goal is to continue with weight loss efforts. She has agreed to the Category 3 Plan.   Exercise goals: No exercise has been prescribed at this time.  Behavioral modification strategies: increasing lean protein intake, meal planning and cooking strategies, keeping healthy foods in the home, and planning for success.  Lenee has agreed to follow-up with our clinic in 2 weeks. She was informed of the importance of frequent follow-up visits to maximize her success with intensive lifestyle modifications for her multiple health conditions.   Objective:   Blood pressure 110/73, pulse 63, temperature 98.3 F (36.8 C), height 5\' 6"  (1.676 m), weight 204 lb (92.5 kg), SpO2 98 %. Body mass index is 32.93 kg/m.  General: Cooperative, alert, well developed, in no acute distress. HEENT: Conjunctivae and lids unremarkable. Cardiovascular: Regular rhythm.  Lungs: Normal work of breathing. Neurologic: No focal deficits.   Lab Results  Component Value Date   CREATININE 0.77 11/05/2022   BUN 18 11/05/2022   NA 140 11/05/2022   K 4.1 11/05/2022   CL 103 11/05/2022   CO2 28 11/05/2022  Lab Results  Component Value Date   ALT 17 11/05/2022   AST 19 11/05/2022   ALKPHOS 87 11/05/2022   BILITOT 0.5 11/05/2022   Lab Results  Component Value Date   HGBA1C 5.6 11/05/2022   HGBA1C 5.5 12/14/2021   HGBA1C 5.5 09/25/2019   HGBA1C 5.7 01/05/2019   Lab Results  Component Value Date   INSULIN 13.7 11/28/2022   Lab Results  Component Value Date   TSH 3.04 11/05/2022   Lab Results  Component Value Date   CHOL 218 (H) 11/28/2022    HDL 70 11/28/2022   LDLCALC 129 (H) 11/28/2022   TRIG 106 11/28/2022   CHOLHDL 2.9 07/10/2022   Lab Results  Component Value Date   VD25OH 59.4 11/28/2022   VD25OH 48.02 01/05/2019   Lab Results  Component Value Date   WBC 7.2 11/16/2022   HGB 12.5 11/16/2022   HCT 38.3 11/16/2022   MCV 93.2 11/16/2022   PLT 338 11/16/2022   Lab Results  Component Value Date   IRON 77 09/25/2019   TIBC 336 09/25/2019   FERRITIN 37 09/25/2019   Attestation Statements:   Reviewed by clinician on day of visit: allergies, medications, problem list, medical history, surgical history, family history, social history, and previous encounter notes.  Time spent on visit including pre-visit chart review and post-visit care and charting was 42 minutes.   I, Burt Knack, am acting as transcriptionist for Reuben Likes, MD.  I have reviewed the above documentation for accuracy and completeness, and I agree with the above. - Reuben Likes, MD

## 2022-12-18 ENCOUNTER — Other Ambulatory Visit: Payer: Self-pay | Admitting: Gastroenterology

## 2022-12-19 ENCOUNTER — Other Ambulatory Visit: Payer: Self-pay | Admitting: *Deleted

## 2022-12-19 DIAGNOSIS — M47816 Spondylosis without myelopathy or radiculopathy, lumbar region: Secondary | ICD-10-CM

## 2022-12-19 DIAGNOSIS — M5416 Radiculopathy, lumbar region: Secondary | ICD-10-CM

## 2023-01-02 ENCOUNTER — Ambulatory Visit (INDEPENDENT_AMBULATORY_CARE_PROVIDER_SITE_OTHER): Payer: Medicare Other | Admitting: Family Medicine

## 2023-01-02 ENCOUNTER — Ambulatory Visit: Payer: Medicare Other | Admitting: Family Medicine

## 2023-01-02 ENCOUNTER — Encounter (INDEPENDENT_AMBULATORY_CARE_PROVIDER_SITE_OTHER): Payer: Self-pay | Admitting: Family Medicine

## 2023-01-02 VITALS — BP 102/66 | HR 72 | Temp 98.3°F | Ht 66.0 in | Wt 207.0 lb

## 2023-01-02 DIAGNOSIS — E669 Obesity, unspecified: Secondary | ICD-10-CM

## 2023-01-02 DIAGNOSIS — E7849 Other hyperlipidemia: Secondary | ICD-10-CM

## 2023-01-02 DIAGNOSIS — Z6833 Body mass index (BMI) 33.0-33.9, adult: Secondary | ICD-10-CM

## 2023-01-02 DIAGNOSIS — E66811 Obesity, class 1: Secondary | ICD-10-CM

## 2023-01-02 NOTE — Progress Notes (Signed)
Chief Complaint:   OBESITY Maureen Randall is here to discuss her progress with her obesity treatment plan along with follow-up of her obesity related diagnoses. Maureen Randall is on the Category 3 Plan and states she is following her eating plan approximately 30% of the time. Maureen Randall states she is walking 4-5 miles 7 times per week.   Today's visit was #: 3 Starting weight: 207 lbs Starting date: 11/28/2022 Today's weight: 207 lbs Today's date: 01/02/2023 Total lbs lost to date: 0 Total lbs lost since last in-office visit: 0  Interim History: Patient has been traveling quite a bit- [place], [place], [place]. Maureen Randall.  She has not really been home at all.  That made adherence to meal plan difficult. She realizes she didn't get enough protein in while away. She is planning to stay home for the next few weeks.  She feels really tired from her recent travels and ready to be home for the next few weeks. She got used to eating out while away and has struggled to cook at home once returning.   Subjective:   1. Other hyperlipidemia Patient is on Zetia daily with no side effects noted.  Her last LDL was 129, HDL 70, and triglycerides 865.  Assessment/Plan:   1. Other hyperlipidemia We will repeat fasting labs in September.  Patient will continue her current medication.  2. BMI 33.0-33.9,adult  3. Obesity with starting BMI of 33.5 Maureen Randall is currently in the action stage of change. As such, her goal is to continue with weight loss efforts. She has agreed to the Category 3 Plan.   Exercise goals: All adults should avoid inactivity. Some physical activity is better than none, and adults who participate in any amount of physical activity gain some health benefits.  Behavioral modification strategies: increasing lean protein intake, meal planning and cooking strategies, keeping healthy foods in the home, and planning for success.  Maureen Randall has agreed to follow-up with our clinic in 3 weeks. She was informed of the  importance of frequent follow-up visits to maximize her success with intensive lifestyle modifications for her multiple health conditions.   Objective:   Blood pressure 102/66, pulse 72, temperature 98.3 F (36.8 C), height 5\' 6"  (1.676 m), weight 207 lb (93.9 kg), SpO2 97 %. Body mass index is 33.41 kg/m.  General: Cooperative, alert, well developed, in no acute distress. HEENT: Conjunctivae and lids unremarkable. Cardiovascular: Regular rhythm.  Lungs: Normal work of breathing. Neurologic: No focal deficits.   Lab Results  Component Value Date   CREATININE 0.77 11/05/2022   BUN 18 11/05/2022   NA 140 11/05/2022   K 4.1 11/05/2022   CL 103 11/05/2022   CO2 28 11/05/2022   Lab Results  Component Value Date   ALT 17 11/05/2022   AST 19 11/05/2022   ALKPHOS 87 11/05/2022   BILITOT 0.5 11/05/2022   Lab Results  Component Value Date   HGBA1C 5.6 11/05/2022   HGBA1C 5.5 12/14/2021   HGBA1C 5.5 09/25/2019   HGBA1C 5.7 01/05/2019   Lab Results  Component Value Date   INSULIN 13.7 11/28/2022   Lab Results  Component Value Date   TSH 3.04 11/05/2022   Lab Results  Component Value Date   CHOL 218 (H) 11/28/2022   HDL 70 11/28/2022   LDLCALC 129 (H) 11/28/2022   TRIG 106 11/28/2022   CHOLHDL 2.9 07/10/2022   Lab Results  Component Value Date   VD25OH 59.4 11/28/2022   VD25OH 48.02 01/05/2019   Lab Results  Component Value Date   WBC 7.2 11/16/2022   HGB 12.5 11/16/2022   HCT 38.3 11/16/2022   MCV 93.2 11/16/2022   PLT 338 11/16/2022   Lab Results  Component Value Date   IRON 77 09/25/2019   TIBC 336 09/25/2019   FERRITIN 37 09/25/2019   Attestation Statements:   Reviewed by clinician on day of visit: allergies, medications, problem list, medical history, surgical history, family history, social history, and previous encounter notes.   I, Burt Knack, am acting as transcriptionist for Reuben Likes, MD.  I have reviewed the above documentation  for accuracy and completeness, and I agree with the above. - Reuben Likes, MD

## 2023-01-03 ENCOUNTER — Ambulatory Visit: Payer: Medicare Other | Admitting: Family Medicine

## 2023-01-06 ENCOUNTER — Other Ambulatory Visit: Payer: Self-pay | Admitting: Gastroenterology

## 2023-01-07 ENCOUNTER — Ambulatory Visit (INDEPENDENT_AMBULATORY_CARE_PROVIDER_SITE_OTHER): Payer: Medicare Other | Admitting: Family Medicine

## 2023-01-07 VITALS — BP 108/72 | HR 60 | Temp 98.2°F | Ht 66.0 in | Wt 209.0 lb

## 2023-01-07 DIAGNOSIS — E669 Obesity, unspecified: Secondary | ICD-10-CM | POA: Diagnosis not present

## 2023-01-07 DIAGNOSIS — M1990 Unspecified osteoarthritis, unspecified site: Secondary | ICD-10-CM

## 2023-01-07 DIAGNOSIS — M159 Polyosteoarthritis, unspecified: Secondary | ICD-10-CM | POA: Diagnosis not present

## 2023-01-07 DIAGNOSIS — Z6833 Body mass index (BMI) 33.0-33.9, adult: Secondary | ICD-10-CM

## 2023-01-07 MED ORDER — DICLOFENAC SODIUM 1 % EX GEL
CUTANEOUS | 11 refills | Status: DC
Start: 2023-01-07 — End: 2023-08-24

## 2023-01-07 NOTE — Patient Instructions (Signed)
It was a pleasure meeting you today. Thank you for allowing me to take part in your health care.  Our goals for today as we discussed include:  It's important to follow the panned diet that was provided by [facility]. This is the first step to long term success in weight loss.   Follow up with Dr Marquis Lunch as scheduled  Follow up with PCP as needed   If you have any questions or concerns, please do not hesitate to call the office at [phone].  I look forward to our next visit and until then take care and stay safe.  Regards,   Dana Allan, MD   [facility]

## 2023-01-07 NOTE — Progress Notes (Signed)
SUBJECTIVE:   Chief Complaint  Patient presents with   Follow-up    On weight loss,   HPI Patient presents to clinic for f/u weight management  She has established care with [facility] in [place].  She plans to stay with the program.  She initially lost weight on the the diet but gained back during travelling.  She is wanting to start medication and reports that she thought this would be prescribed by PCP as she reports that she was told that weight loss medication would not be prescribed at the weight loss clinic.  I reiterated to her that when we had initially spoken about the clinic it would be to focus on lifestyle and learning how to eat healthy.  Medication may be a possibility in the future but this is not the expectation initially.    Arthritis Requesting refill for Diclofenac.  PERTINENT PMH / PSH: Class 2 obesity arthritis  OBJECTIVE:  BP 108/72 (BP Location: Left Arm, Patient Position: Sitting, Cuff Size: Normal)   Pulse 60   Temp 98.2 F (36.8 C) (Oral)   Ht 5\' 6"  (1.676 m)   Wt 209 lb (94.8 kg)   SpO2 95%   BMI 33.73 kg/m    Physical Exam Vitals reviewed.  Constitutional:      General: She is not in acute distress.    Appearance: Normal appearance. She is normal weight. She is not ill-appearing, toxic-appearing or diaphoretic.  Eyes:     General:        Right eye: No discharge.        Left eye: No discharge.     Conjunctiva/sclera: Conjunctivae normal.  Cardiovascular:     Rate and Rhythm: Normal rate.  Pulmonary:     Effort: Pulmonary effort is normal.  Skin:    General: Skin is dry.  Neurological:     Mental Status: She is alert and oriented to person, place, and time. Mental status is at baseline.  Psychiatric:        Mood and Affect: Mood normal.        Behavior: Behavior normal.        Thought Content: Thought content normal.        Judgment: Judgment normal.        01/07/2023    2:40 PM 08/16/2022   10:45 AM  08/06/2022   11:18 AM 05/14/2022    3:18 PM 04/17/2022    8:37 AM  Depression screen PHQ 2/9  Decreased Interest 1  1  1   Down, Depressed, Hopeless 0  1  0  PHQ - 2 Score 1  2  1   Altered sleeping   1    Tired, decreased energy   1    Change in appetite   0    Feeling bad or failure about yourself    0    Trouble concentrating   0    Moving slowly or fidgety/restless   0    Suicidal thoughts   0    PHQ-9 Score   4    Difficult doing work/chores   Somewhat difficult       Information is confidential and restricted. Go to Review Flowsheets to unlock data.      01/07/2023    2:40 PM 08/16/2022   10:46 AM 08/06/2022   11:18 AM 05/14/2022    3:18 PM  GAD 7 : Generalized Anxiety Score  Nervous, Anxious, on Edge 1  1   Control/stop worrying  0  0   Worry too much - different things 0  1   Trouble relaxing 0  0   Restless 0  0   Easily annoyed or irritable 1  1   Afraid - awful might happen 0  0   Total GAD 7 Score 2  3   Anxiety Difficulty Somewhat difficult  Somewhat difficult      Information is confidential and restricted. Go to Review Flowsheets to unlock data.    ASSESSMENT/PLAN:  Primary osteoarthritis involving multiple joints Assessment & Plan: Refill for Diclofenac gel  Orders: -     Diclofenac Sodium; APPLY 2 GRAMS TO UPPER BODY FOUR TIMES DAILY AS NEEDED AND 4 GRAMS TO LOWER BODY FOUR TIMES DAILY AS NEEDED  Dispense: 100 g; Refill: 11  Obesity (BMI 30-39.9) Assessment & Plan: Continue to follow up with weight loss clinic Discussed with patient that she should discuss weight loss medication once she has learned lifestyle and if plateau in weight.      PDMP reviewed  Return if symptoms worsen or fail to improve.  Dana Allan, MD

## 2023-01-08 ENCOUNTER — Ambulatory Visit
Admission: RE | Admit: 2023-01-08 | Discharge: 2023-01-08 | Disposition: A | Discharge status: Home / Self Care | Payer: Medicare Other | Source: Ambulatory Visit | Attending: *Deleted | Admitting: *Deleted

## 2023-01-08 DIAGNOSIS — M47816 Spondylosis without myelopathy or radiculopathy, lumbar region: Secondary | ICD-10-CM

## 2023-01-08 DIAGNOSIS — M5416 Radiculopathy, lumbar region: Secondary | ICD-10-CM

## 2023-01-08 MED ORDER — GADOPICLENOL 0.5 MMOL/ML IV SOLN
10.0000 mL | Freq: Once | INTRAVENOUS | Status: AC | PRN
  Administered 2023-01-08: 10 mL via INTRAVENOUS

## 2023-01-22 ENCOUNTER — Ambulatory Visit (INDEPENDENT_AMBULATORY_CARE_PROVIDER_SITE_OTHER): Payer: Medicare Other | Admitting: Family Medicine

## 2023-01-29 ENCOUNTER — Encounter (INDEPENDENT_AMBULATORY_CARE_PROVIDER_SITE_OTHER): Payer: Self-pay | Admitting: Family Medicine

## 2023-01-29 ENCOUNTER — Ambulatory Visit (INDEPENDENT_AMBULATORY_CARE_PROVIDER_SITE_OTHER): Payer: Medicare Other | Admitting: Family Medicine

## 2023-01-29 VITALS — BP 110/77 | HR 68 | Temp 98.4°F | Ht 66.0 in | Wt 204.0 lb

## 2023-01-29 DIAGNOSIS — E7849 Other hyperlipidemia: Secondary | ICD-10-CM

## 2023-01-29 DIAGNOSIS — E669 Obesity, unspecified: Secondary | ICD-10-CM | POA: Diagnosis not present

## 2023-01-29 DIAGNOSIS — Z6832 Body mass index (BMI) 32.0-32.9, adult: Secondary | ICD-10-CM | POA: Diagnosis not present

## 2023-01-29 DIAGNOSIS — Z6833 Body mass index (BMI) 33.0-33.9, adult: Secondary | ICD-10-CM

## 2023-01-29 NOTE — Progress Notes (Unsigned)
Chief Complaint:   OBESITY Maureen Randall is here to discuss her progress with her obesity treatment plan along with follow-up of her obesity related diagnoses. [name] is on the Category 3 Plan and states she is following her eating plan approximately 50% of the time. [name] states she is walking for 30-45 minutes 3-4 times per week.  Today's visit was #: 4 Starting weight: 207 lbs Starting date: 11/28/2022 Today's weight: 204 lbs Today's date: 01/29/2023 Total lbs lost to date: 3 Total lbs lost since last in-office visit: 3  Interim History: Patient was feeling very poorly all week last week with nausea and vomiting and intolerance to almost all foods.  She had chills, felt hot on and off.  She finally started feeling better a few days ago.  Thankfully no one else got sick.  Her appetite still isn't back to normal but the nausea is improved. She is planning for another lower back injection in 1 week.   Subjective:   1. Other hyperlipidemia Patient's last DL was 409, HDL 70, and triglycerides 811.  Patient is not on statin (on Zetia).   Assessment/Plan:   1. Other hyperlipidemia Patient will continue Zetia with no change in medication or dose.  2. BMI 33.0-33.9,adult  3. Obesity with starting BMI of 33.5 [name] is currently in the action stage of change. As such, her goal is to continue with weight loss efforts. She has agreed to the Category 3 Plan and keeping a food journal and adhering to recommended goals of 1500-1700 calories and 130+ grams of protein daily.   Exercise goals: All adults should avoid inactivity. Some physical activity is better than none, and adults who participate in any amount of physical activity gain some health benefits.  Behavioral modification strategies: increasing lean protein intake, meal planning and cooking strategies, keeping healthy foods in the home, and planning for success.  [name] has agreed to follow-up with our clinic in 3 weeks. She was informed of  the importance of frequent follow-up visits to maximize her success with intensive lifestyle modifications for her multiple health conditions.   Objective:   Blood pressure 110/77, pulse 68, temperature 98.4 F (36.9 C), height 5\' 6"  (1.676 m), weight 204 lb (92.5 kg), SpO2 100%. Body mass index is 32.93 kg/m.  General: Cooperative, alert, well developed, in no acute distress. HEENT: Conjunctivae and lids unremarkable. Cardiovascular: Regular rhythm.  Lungs: Normal work of breathing. Neurologic: No focal deficits.   Lab Results  Component Value Date   CREATININE 0.77 11/05/2022   BUN 18 11/05/2022   NA 140 11/05/2022   K 4.1 11/05/2022   CL 103 11/05/2022   CO2 28 11/05/2022   Lab Results  Component Value Date   ALT 17 11/05/2022   AST 19 11/05/2022   ALKPHOS 87 11/05/2022   BILITOT 0.5 11/05/2022   Lab Results  Component Value Date   HGBA1C 5.6 11/05/2022   HGBA1C 5.5 12/14/2021   HGBA1C 5.5 09/25/2019   HGBA1C 5.7 01/05/2019   Lab Results  Component Value Date   INSULIN 13.7 11/28/2022   Lab Results  Component Value Date   TSH 3.04 11/05/2022   Lab Results  Component Value Date   CHOL 218 (H) 11/28/2022   HDL 70 11/28/2022   LDLCALC 129 (H) 11/28/2022   TRIG 106 11/28/2022   CHOLHDL 2.9 07/10/2022   Lab Results  Component Value Date   VD25OH 59.4 11/28/2022   VD25OH 48.02 01/05/2019   Lab Results  Component Value  Date   WBC 7.2 11/16/2022   HGB 12.5 11/16/2022   HCT 38.3 11/16/2022   MCV 93.2 11/16/2022   PLT 338 11/16/2022   Lab Results  Component Value Date   IRON 77 09/25/2019   TIBC 336 09/25/2019   FERRITIN 37 09/25/2019   Attestation Statements:   Reviewed by clinician on day of visit: allergies, medications, problem list, medical history, surgical history, family history, social history, and previous encounter notes.  Time spent on visit including pre-visit chart review and post-visit care and charting was 30 minutes.   I, Burt Knack, am acting as transcriptionist for Reuben Likes, MD.  I have reviewed the above documentation for accuracy and completeness, and I agree with the above. - Reuben Likes, MD

## 2023-02-02 ENCOUNTER — Encounter: Payer: Self-pay | Admitting: Family Medicine

## 2023-02-02 DIAGNOSIS — M1990 Unspecified osteoarthritis, unspecified site: Secondary | ICD-10-CM | POA: Insufficient documentation

## 2023-02-02 NOTE — Assessment & Plan Note (Signed)
Continue to follow up with weight loss clinic Discussed with patient that she should discuss weight loss medication once she has learned lifestyle and if plateau in weight.

## 2023-02-02 NOTE — Assessment & Plan Note (Signed)
Refill for Diclofenac gel

## 2023-02-09 ENCOUNTER — Other Ambulatory Visit: Payer: Self-pay | Admitting: Psychiatry

## 2023-02-09 DIAGNOSIS — G4701 Insomnia due to medical condition: Secondary | ICD-10-CM

## 2023-02-11 ENCOUNTER — Ambulatory Visit: Payer: Medicare Other | Admitting: Psychiatry

## 2023-02-20 ENCOUNTER — Encounter: Payer: Self-pay | Admitting: Psychiatry

## 2023-02-20 ENCOUNTER — Ambulatory Visit (INDEPENDENT_AMBULATORY_CARE_PROVIDER_SITE_OTHER): Payer: Medicare Other | Admitting: Psychiatry

## 2023-02-20 VITALS — BP 130/78 | HR 74 | Temp 97.7°F | Ht 66.0 in | Wt 208.6 lb

## 2023-02-20 DIAGNOSIS — F411 Generalized anxiety disorder: Secondary | ICD-10-CM

## 2023-02-20 DIAGNOSIS — G4701 Insomnia due to medical condition: Secondary | ICD-10-CM | POA: Diagnosis not present

## 2023-02-20 NOTE — Progress Notes (Signed)
[facility] MD OP Progress Note  02/20/2023 5:39 PM Maureen Randall  MRN:  [id number]  Chief Complaint:  Chief Complaint  Patient presents with   Follow-up   Anxiety   Depression   Medication Refill   HPI: Maureen Randall is a 57 year old Caucasian female, married, lives in [place], on disability, has a history of GAD, insomnia, chronic pain, primary osteoarthritis, radiculopathy, degenerative disk disease, spondylolisthesis, bilateral carpal tunnel syndrome was evaluated in office today.  Patient today reports she is currently in pain of her lower back.  She reports she may need another surgery.  The pain does have an effect on her functioning on a daily basis including her sleep.  She reports although she is able to fall asleep with the trazodone sleep is disrupted because she does not feel comfortable laying in certain positions and that does wake her up.  Patient reports although anxious about her pain and the need for another surgery she has been managing okay on the current medication regimen.  She is compliant on the Cymbalta.  She stopped using the BuSpar since she was only using it as needed previously and decided to stop it.  Patient denies any suicidality, homicidality or perceptual disturbances.  Patient is interested in establishing care with a therapist.  Patient denies any other concerns today.  Visit Diagnosis:    ICD-10-CM   1. GAD (generalized anxiety disorder)  F41.1     2. Insomnia due to medical condition  G47.01    Pain      Past Psychiatric History: I have reviewed past psychiatric history from progress note on 04/30/2018.  Past Medical History:  Past Medical History:  Diagnosis Date   Abnormal glucose    Adenoma of left adrenal gland 07/26/2020   Anterior to posterior tear of superior glenoid labrum of right shoulder 11/03/2020   Anxiety    Arthralgia of multiple joints 08/31/2015   Arthritis    osteoarthritis-joints, feet, shoulder   Asthma    previously  diagnosed after a bout of bronchitis/ no problems since   Biceps tendinitis 09/10/2019   Bulging of cervical intervertebral disc without myelopathy    C6-C7   Bursitis    b/l hips   Chronic deep vein thrombosis (DVT) of both lower extremities (HCC)    Chronic pain syndrome    Closed fracture of metatarsal bone 08/06/2017   COPD (chronic obstructive pulmonary disease) (HCC) 2004   patient states probably bronchitis   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   Generalized osteoarthritis    GERD (gastroesophageal reflux disease)    H/O nasal septoplasty 09/10/2019   Headache, post-myelogram 09/11/2021   Headache, post-myelogram 09/11/2021   Hiatal hernia    History of lumbar fusion 01/29/2018   History of right hip replacement (12/2017) 01/29/2018   Hyperlipidemia    Increased band cell count 08/31/2015   Infection of superficial incisional surgical site after procedure 06/07/2021   IUD threads lost 04/30/2018   Left leg DVT (HCC)    fem/pop 12/07/20 after right shoulder surgery 11/30/20   Low back pain, chronic    Menopausal flushing 11/30/2020   Mood swings 03/15/2021   Neuropathy    post surgery  Obesity    Open wound 07/04/2021   Open wound of left lower leg 06/07/2021   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 08/17/2019   Peroneal neuropathy, left    Pneumonia    11/2021 [facility]   PONV (postoperative nausea and vomiting)    Postoperative nausea and vomiting    Preoperative clearance 09/10/2019   PVD (peripheral vascular disease) (HCC)    Recurrent falls    S/P shoulder replacement, right 12/22/2021   Sinusitis    Special  screening for malignant neoplasms, colon    Spondylolisthesis    Strain of foot 01/17/2017   Venous insufficiency of both lower extremities 10/20/2018    Past Surgical History:  Procedure Laterality Date   ABLATION     veins in lower legs   BACK SURGERY  07/2014   lumbar spine/ fusion 1 level   BREAST BIOPSY Left 2011   neg   CARPAL TUNNEL RELEASE Right 12/2015   [facility]   CARPAL TUNNEL RELEASE Left 12/2016   CERVICAL DISC ARTHROPLASTY N/A 09/05/2022   Procedure: CERVICAL SIX-CERVICAL SEVEN ARTIFICIAL DISC REPLACEMENT;  Surgeon: Coletta Memos, MD;  Location: [facility];  Service: Neurosurgery;  Laterality: N/A;  RM 18/3C   COLONOSCOPY WITH PROPOFOL N/A 02/29/2020   Procedure: COLONOSCOPY WITH PROPOFOL;  Surgeon: Midge Minium, MD;  Location: [facility];  Service: Endoscopy;  Laterality: N/A;   ESOPHAGOGASTRODUODENOSCOPY (EGD) WITH PROPOFOL N/A 02/29/2020   Procedure: ESOPHAGOGASTRODUODENOSCOPY (EGD) WITH PROPOFOL;  Surgeon: Midge Minium, MD;  Location: [facility];  Service: Endoscopy;  Laterality: N/A;   FOOT SURGERY Right 2015   broken navicular bone   HIP ARTHROSCOPY Right 05/2011   HIP SURGERY     left hip surgery 05/2018   HYSTEROSCOPY N/A 11/22/2022   Procedure: HYSTEROSCOPY, IUD REMOVAL;  Surgeon: Christeen Douglas, MD;  Location: [facility] ORS;  Service: Gynecology;  Laterality: N/A;   JOINT REPLACEMENT Bilateral 12/2017 & 05/2018   hip   L5-S1 OLIF Dr. Sharolyn Douglas back surgery 09/2019  09/28/2019   left ganglion cyst removal     12/23/19 Dr. Orland Jarred   MASS EXCISION Left 12/17/2019   Procedure: EXCISION TUMOR FOOT DEEP LEFT;  Surgeon: Recardo Evangelist, DPM;  Location: [facility];  Service: Podiatry;  Laterality: Left;  LMA W/LOCAL   NASAL TURBINATE REDUCTION Bilateral 06/12/2019   Procedure: TURBINATE REDUCTION/SUBMUCOSAL RESECTION;  Surgeon: Linus Salmons, MD;  Location: [facility];  Service: ENT;  Laterality: Bilateral;   REPLACEMENT TOTAL HIP  W/  RESURFACING IMPLANTS Right 12/2017   [facility]   right shoulder surgery     11/28/20 for bone spur, torn labrum, re attached biceps tendon, tear Dr. Ranell Patrick   right shoulder surgery     12/22/21 total shoulder replacement [facility] Dr. Candise Bowens CUFF REPAIR     Dr. Ranell Patrick in 04/2019    SCLEROTHERAPY     veins in lower legs   SEPTOPLASTY Bilateral 06/12/2019   Procedure: SEPTOPLASTY;  Surgeon: Linus Salmons, MD;  Location: [facility];  Service: ENT;  Laterality: Bilateral;   SHOULDER SURGERY Right 12/2015   [facility] torn rotator cuff   SPINAL FUSION  2016   [facility] lumbar   TOTAL SHOULDER ARTHROPLASTY Right 12/22/2021   Procedure: TOTAL SHOULDER ARTHROPLASTY;  Surgeon: Beverely Low, MD;  Location: [facility];  Service: Orthopedics;  Laterality: Right;  with ISB    Family Psychiatric History: I have reviewed family psychiatric history from progress  note on 04/30/2018.  Family History:  Family History  Problem Relation Age of Onset   Breast cancer Maternal Aunt    Breast cancer Paternal Aunt    Dementia Paternal Aunt    COPD Mother        quit smoking after 60 years   Arthritis Mother    Osteoarthritis Mother    Dementia Father        symptoms started in his 67s   Alzheimer's disease Father    Rheum arthritis Father    Rheum arthritis Other        FH   Dementia Other        "all my dad's side of the family"   Other Maternal Grandmother        got dementia as she got older    Dementia Paternal Grandmother     Social History: I have reviewed social history from progress note on 04/30/2018. Social History   Socioeconomic History   Marital status: Married    Spouse name: fredrick   Number of children: 1   Years of education: Not on file   Highest education level: 12th grade  Occupational History    Comment: full time  Tobacco Use   Smoking status: Never   Smokeless tobacco: Never  Vaping Use   Vaping status: Never Used   Substance and Sexual Activity   Alcohol use: Not Currently    Alcohol/week: 1.0 standard drink of alcohol    Types: 1 Cans of beer per week    Comment: may have drink 1x/month   Drug use: Never   Sexual activity: Yes  Other Topics Concern   Not on file  Social History Narrative   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    Married 1 child    Lives at home with her husband   Right handed   Caffeine: maybe 1 cup/day (pepsi)   Social Determinants of Health   Financial Resource Strain: Medium Risk (11/04/2022)   Overall Financial Resource Strain (CARDIA)    Difficulty of Paying Living Expenses: Somewhat hard  Food Insecurity: Food Insecurity Present (11/04/2022)   Hunger Vital Sign    Worried About Running Out of Food in the Last Year: Sometimes true    Ran Out of Food in the Last Year: Sometimes true  Transportation Needs: No Transportation Needs (11/04/2022)   PRAPARE - Administrator, Civil Service (Medical): No    Lack of Transportation (Non-Medical): No  Physical Activity: Unknown (11/04/2022)   Exercise Vital Sign    Days of Exercise per Week: 0 days    Minutes of Exercise per Session: Not on file  Stress: No Stress Concern Present (11/04/2022)   [employer] of Occupational Health - Occupational Stress Questionnaire    Feeling of Stress : Not at all  Social Connections: Unknown (11/04/2022)   Social Connection and Isolation Panel [NHANES]    Frequency of Communication with Friends and Family: More than three times a week    Frequency of Social Gatherings with Friends and Family: Patient declined    Attends Religious Services: Patient declined    Database administrator or Organizations: No    Attends Engineer, structural: Not on file    Marital Status: Married    Allergies:  Allergies  Allergen Reactions   Amoxicillin Hives   Crestor [Rosuvastatin]     Myalgia    Septra [Sulfamethoxazole-Trimethoprim] Hives        Sulfamethoxazole  Hives  Trimethoprim Hives   Zanaflex [Tizanidine] Other (See Comments)    Weak muscles     Keflex [Cephalexin] Rash         Metabolic Disorder Labs: Lab Results  Component Value Date   HGBA1C 5.6 11/05/2022   MPG 111.15 12/14/2021   No results found for: "PROLACTIN" Lab Results  Component Value Date   CHOL 218 (H) 11/28/2022   TRIG 106 11/28/2022   HDL 70 11/28/2022   CHOLHDL 2.9 07/10/2022   VLDL 40.9 05/17/2022   LDLCALC 129 (H) 11/28/2022   LDLCALC 96 07/10/2022   Lab Results  Component Value Date   TSH 3.04 11/05/2022   TSH 2.78 05/17/2022    Therapeutic Level Labs: No results found for: "LITHIUM" No results found for: "VALPROATE" No results found for: "CBMZ"  Current Medications: Current Outpatient Medications  Medication Sig Dispense Refill   acetaminophen (TYLENOL) 650 MG CR tablet Take 650 mg by mouth every 8 (eight) hours as needed (pain/headaches.).     baclofen (LIORESAL) 10 MG tablet Take 1 tablet (10 mg total) by mouth 3 (three) times daily as needed for muscle spasms. 60 each 1   Buprenorphine HCl (BELBUCA) 450 MCG FILM Place 450 mcg inside cheek in the morning and at bedtime.     cetirizine (ZYRTEC) 10 MG tablet Take 10 mg by mouth daily as needed for allergies.     diclofenac Sodium (VOLTAREN) 1 % GEL APPLY 2 GRAMS TO UPPER BODY FOUR TIMES DAILY AS NEEDED AND 4 GRAMS TO LOWER BODY FOUR TIMES DAILY AS NEEDED 100 g 11   DULoxetine (CYMBALTA) 60 MG capsule TAKE 1 CAPSULE BY MOUTH DAILY 90 capsule 1   estradiol (ESTRACE) 0.1 MG/GM vaginal cream Place 1 Applicatorful vaginally once a week.     ezetimibe (ZETIA) 10 MG tablet Take 1 tablet (10 mg total) by mouth daily. 90 tablet 3   famotidine (PEPCID) 20 MG tablet Take 1 tablet (20 mg total) by mouth 2 (two) times daily. TAKE 1 TABLET BY MOUTH EVERYDAY AT BEDTIME 180 tablet 3   fluticasone (FLONASE) 50 MCG/ACT nasal spray SHAKE LIQUID AND USE 1 TO 2 SPRAYS IN EACH NOSTRIL DAILY AS NEEDED. MAX DOSE 2  SPRAYS IN EACH NOSTRIL 16 g 11   hydrOXYzine (ATARAX) 10 MG tablet TAKE 1 TABLET BY MOUTH DAILY AS NEEDED FOR ANXIETY. 90 tablet 1   naloxone (NARCAN) nasal spray 4 mg/0.1 mL Place 0.4 mg into the nose once.     nitrofurantoin (MACRODANTIN) 50 MG capsule Take 50 mg by mouth 4 (four) times daily.     omeprazole (PRILOSEC) 40 MG capsule TAKE 1 CAPSULE BY MOUTH 2 TIMES A DAY BEFORE MEALS 60 capsule 5   ondansetron (ZOFRAN-ODT) 8 MG disintegrating tablet Take 1 tablet (8 mg total) by mouth every 8 (eight) hours as needed for nausea or vomiting. 30 tablet 0   oxyCODONE-acetaminophen (PERCOCET) 10-325 MG tablet Take 1 tablet by mouth in the morning, at noon, in the evening, and at bedtime.     pregabalin (LYRICA) 50 MG capsule Take 100 mg by mouth 2 (two) times daily.     psyllium (METAMUCIL SMOOTH TEXTURE) 28 % packet Take 1 packet by mouth every other day.     rivaroxaban (XARELTO) 10 MG TABS tablet Take 10 mg by mouth in the morning. (1000)     saccharomyces boulardii (FLORASTOR) 250 MG capsule Take 1 capsule (250 mg total) by mouth daily. 90 capsule 0   sucralfate (CARAFATE) 1 g tablet TAKE  1 TABLET BY MOUTH 4 TIMES A DAY 120 tablet 11   traZODone (DESYREL) 100 MG tablet Take 200 mg by mouth at bedtime.     TURMERIC PO Take 2,000 mg by mouth in the morning. 1000 mg/capsule     valACYclovir (VALTREX) 500 MG tablet Take 500 mg by mouth 2 (two) times daily as needed (outbreaks).     No current facility-administered medications for this visit.     Musculoskeletal: Strength & Muscle Tone: within normal limits Gait & Station: normal Patient leans: N/A  Psychiatric Specialty Exam: Review of Systems  Psychiatric/Behavioral:  Positive for sleep disturbance. The patient is nervous/anxious.     Blood pressure 130/78, pulse 74, temperature 97.7 F (36.5 C), temperature source Skin, height 5\' 6"  (1.676 m), weight 208 lb 9.6 oz (94.6 kg).Body mass index is 33.67 kg/m.  General Appearance: Casual   Eye Contact:  Fair  Speech:  Clear and Coherent  Volume:  Normal  Mood:  Anxious  Affect:  Appropriate  Thought Process:  Goal Directed and Descriptions of Associations: Intact  Orientation:  Full (Time, Place, and Person)  Thought Content: Logical   Suicidal Thoughts:  No  Homicidal Thoughts:  No  Memory:  Immediate;   Fair Recent;   Fair Remote;   Fair  Judgement:  Fair  Insight:  Fair  Psychomotor Activity:  Normal  Concentration:  Concentration: Fair and Attention Span: Fair  Recall:  [employer] of Knowledge: Fair  Language: Fair  Akathisia:  No  Handed:  Right  AIMS (if indicated): not done  Assets:  Communication Skills Desire for Improvement Housing Intimacy Social Support  ADL's:  Intact  Cognition: WNL  Sleep:  Poor, pain   Screenings: AIMS    Flowsheet Row Video Visit from 12/26/2021 in [facility]  AIMS Total Score 0      GAD-7    Flowsheet Row Office Visit from 02/20/2023 in [facility] Office Visit from 01/07/2023 in [facility] [employer] Visit from 08/16/2022 in [facility] Office Visit from 08/06/2022 in [facility] Video Visit from 05/14/2022 in [facility]  Total GAD-7 Score 3 2 3 3 1       Mini-Mental    Flowsheet Row Office Visit from 09/23/2019 in [facility]  Total Score (max 30 points ) 27      PHQ2-9    Flowsheet Row Office Visit from 02/20/2023 in [facility] Office Visit from 01/07/2023 in [facility] Visit from 08/16/2022 in [facility] Office Visit from 08/06/2022 in [facility] Video Visit from 05/14/2022 in  [facility]  PHQ-2 Total Score 1 1 1 2  0  PHQ-9 Total Score 3 -- 3 4 --      Flowsheet Row Office Visit from 02/20/2023 in [facility] Admission (Discharged) from 11/22/2022 in [facility] Pre-Admission Testing 45 from 11/13/2022 in [facility] PRE ADMISSION TESTING  C-SSRS RISK CATEGORY No Risk No Risk No Risk        Assessment and Plan: GRAYCIE LEATHEM is a 57 year old Caucasian female, married, on SSD, lives in [place], has a history of GAD, insomnia, chronic pain, multiple other medical problem was  evaluated in office today.  Patient is currently struggling with pain which does have an impact on her sleep although mood symptoms currently managed on the current medication regimen.  Plan as noted below.  Plan GAD-stable Cymbalta 60 mg p.o. daily Discontinue BuSpar, patient noncompliant. Hydroxyzine 10 mg p.o. daily as needed for severe anxiety attacks only. Patient advised to establish care with a therapist to start CBT Provided resources.  Insomnia-unstable Trazodone 200 mg p.o. nightly as needed Melatonin 3 mg p.o. nightly Patient to establish care with therapist-start CBT I She will need sufficient pain management.  Follow-up in clinic in 3 to 4 months or sooner if needed.   Collaboration of Care: Collaboration of Care: Referral or follow-up with counselor/therapist AEB patient encouraged to establish care with therapist.  Patient/Guardian was advised Release of Information must be obtained prior to any record release in order to collaborate their care with an outside provider. Patient/Guardian was advised if they have not already done so to contact the registration department to sign all necessary forms in order for [place] to release information regarding their care.   Consent: Patient/Guardian gives verbal consent for treatment and  assignment of benefits for services provided during this visit. Patient/Guardian expressed understanding and agreed to proceed.   This note was generated in part or whole with voice recognition software. Voice recognition is usually quite accurate but there are transcription errors that can and very often do occur. I apologize for any typographical errors that were not detected and corrected.    Jomarie Longs, MD 02/21/2023, 6:12 PM

## 2023-02-20 NOTE — Patient Instructions (Signed)
  [email or web address]  www.[email or web address]  [employer] [email or web address] [address] [place], [place] [address]   [phone]  [facility] [email or web address] [address] [place], [place] [address]  [phone]   [facility] - [phone]  [facility] - [phone]  [facility] - [phone] counseling [phone]  [facility] [phone]   [email or web address] this clinician can offer telehealth and has a sliding scale option  [email or web address] this group also offers sliding scale rates and is based out of [place]   [employer] [facility] has interns who offer sliding scale rates and some of the full time clinicians do, as well. You complete their contact form on their website and the referrals coordinator will help to get connected to someone   Medicaid below :  [facility] [address] [place], [place] [address]  [phone]    [employer] [address] [place], [place] [address]  [phone]    [facility] [address] [place], [place] [address]  [phone]

## 2023-02-25 ENCOUNTER — Ambulatory Visit (INDEPENDENT_AMBULATORY_CARE_PROVIDER_SITE_OTHER): Payer: Medicare Other | Admitting: Family Medicine

## 2023-03-04 ENCOUNTER — Encounter (INDEPENDENT_AMBULATORY_CARE_PROVIDER_SITE_OTHER): Payer: Self-pay | Admitting: Family Medicine

## 2023-03-04 ENCOUNTER — Telehealth (INDEPENDENT_AMBULATORY_CARE_PROVIDER_SITE_OTHER): Payer: Self-pay | Admitting: Family Medicine

## 2023-03-04 ENCOUNTER — Encounter (INDEPENDENT_AMBULATORY_CARE_PROVIDER_SITE_OTHER): Payer: Self-pay

## 2023-03-04 ENCOUNTER — Ambulatory Visit (INDEPENDENT_AMBULATORY_CARE_PROVIDER_SITE_OTHER): Payer: Medicare Other | Admitting: Family Medicine

## 2023-03-04 VITALS — BP 118/79 | HR 63 | Temp 98.2°F | Ht 66.0 in | Wt 200.0 lb

## 2023-03-04 DIAGNOSIS — E66811 Obesity, class 1: Secondary | ICD-10-CM

## 2023-03-04 DIAGNOSIS — Z6832 Body mass index (BMI) 32.0-32.9, adult: Secondary | ICD-10-CM | POA: Diagnosis not present

## 2023-03-04 DIAGNOSIS — E7849 Other hyperlipidemia: Secondary | ICD-10-CM

## 2023-03-04 DIAGNOSIS — E669 Obesity, unspecified: Secondary | ICD-10-CM

## 2023-03-04 MED ORDER — WEGOVY 0.25 MG/0.5ML ~~LOC~~ SOAJ
0.2500 mg | SUBCUTANEOUS | 0 refills | Status: DC
Start: 2023-03-04 — End: 2023-08-24

## 2023-03-04 MED ORDER — WEGOVY 0.25 MG/0.5ML ~~LOC~~ SOAJ
0.2500 mg | SUBCUTANEOUS | 0 refills | Status: DC
Start: 2023-03-04 — End: 2023-03-04

## 2023-03-04 NOTE — Telephone Encounter (Signed)
The patient called to report that her [facility] prescription was not sent to the pharmacy. The e-prescribing status showed "transmission to pharmacy failed". Additionally, she requested more information about a assistance program for [facility]. Please sent Mychart message or give her a call. Thank you!

## 2023-03-04 NOTE — Telephone Encounter (Signed)
Message sent to pt.

## 2023-03-04 NOTE — Progress Notes (Signed)
Chief Complaint:   OBESITY Maureen Randall is here to discuss her progress with her obesity treatment plan along with follow-up of her obesity related diagnoses. Maureen Randall is on the Category 3 Plan and keeping a food journal and adhering to recommended goals of 1500-1700 calories and 130+ grams of protein and states she is following her eating plan approximately 75% of the time. Maureen Randall states she is walking for 30 minutes 3-4 times per week.  Today's visit was #: 5 Starting weight: 207 lbs Starting date: 11/28/2022 Today's weight: 200 lbs Today's date: 03/04/2023 Total lbs lost to date: 7 Total lbs lost since last in-office visit: 4  Interim History: Over the last month patient has been very mindful of food choices.  She goes back to meal plan some days and other days she is trying to really focus on getting more protein in.  She is able to get clothes on that she previously couldn't fit into.  Sometime she isn't eating enough.  She tried to log food for a bit but realized she wasn't getting all the food in. She is making healthier choices even when eating out.  She did have to get a back injection about a month ago and got 2 days of pain relief.  She is planning to try another injection shortly and if that doesn't give her 4 months of relief will be looking at surgery. She is interested possibly in pursuing weight loss injection options.  Subjective:   1. Other hyperlipidemia Patient is on Zetia with no side effects.  Her last LDL was 129, total cholesterol 218, and HDL 70.  Assessment/Plan:   1. Other hyperlipidemia Patient will continue Zetia with no change in medication or dose.  2. BMI 32.0-32.9,adult  3. Obesity with starting BMI of 33.5 Patient agreed to start Wegovy 0.25 mg subcu weekly with no refills.  - Semaglutide-Weight Management (WEGOVY) 0.25 MG/0.5ML SOAJ; Inject 0.25 mg into the skin once a week.  Dispense: 2 mL; Refill: 0  Maureen Randall is currently in the action stage of change. As  such, her goal is to continue with weight loss efforts. She has agreed to keeping a food journal and adhering to recommended goals of 1500-1700 calories and 130+ grams of protein daily.   Exercise goals: All adults should avoid inactivity. Some physical activity is better than none, and adults who participate in any amount of physical activity gain some health benefits.  Behavioral modification strategies: increasing lean protein intake, meal planning and cooking strategies, keeping healthy foods in the home, and planning for success.  Maureen Randall has agreed to follow-up with our clinic in 4 weeks. She was informed of the importance of frequent follow-up visits to maximize her success with intensive lifestyle modifications for her multiple health conditions.   Objective:   Blood pressure 118/79, pulse 63, temperature 98.2 F (36.8 C), height 5\' 6"  (1.676 m), weight 200 lb (90.7 kg), SpO2 100%. Body mass index is 32.28 kg/m.  General: Cooperative, alert, well developed, in no acute distress. HEENT: Conjunctivae and lids unremarkable. Cardiovascular: Regular rhythm.  Lungs: Normal work of breathing. Neurologic: No focal deficits.   Lab Results  Component Value Date   CREATININE 0.77 11/05/2022   BUN 18 11/05/2022   NA 140 11/05/2022   K 4.1 11/05/2022   CL 103 11/05/2022   CO2 28 11/05/2022   Lab Results  Component Value Date   ALT 17 11/05/2022   AST 19 11/05/2022   ALKPHOS 87 11/05/2022   BILITOT  0.5 11/05/2022   Lab Results  Component Value Date   HGBA1C 5.6 11/05/2022   HGBA1C 5.5 12/14/2021   HGBA1C 5.5 09/25/2019   HGBA1C 5.7 01/05/2019   Lab Results  Component Value Date   INSULIN 13.7 11/28/2022   Lab Results  Component Value Date   TSH 3.04 11/05/2022   Lab Results  Component Value Date   CHOL 218 (H) 11/28/2022   HDL 70 11/28/2022   LDLCALC 129 (H) 11/28/2022   TRIG 106 11/28/2022   CHOLHDL 2.9 07/10/2022   Lab Results  Component Value Date   VD25OH  59.4 11/28/2022   VD25OH 48.02 01/05/2019   Lab Results  Component Value Date   WBC 7.2 11/16/2022   HGB 12.5 11/16/2022   HCT 38.3 11/16/2022   MCV 93.2 11/16/2022   PLT 338 11/16/2022   Lab Results  Component Value Date   IRON 77 09/25/2019   TIBC 336 09/25/2019   FERRITIN 37 09/25/2019   Attestation Statements:   Reviewed by clinician on day of visit: allergies, medications, problem list, medical history, surgical history, family history, social history, and previous encounter notes.  Time spent on visit including pre-visit chart review and post-visit care and charting was 30 minutes.   I, Burt Knack, am acting as transcriptionist for Reuben Likes, MD.  I have reviewed the above documentation for accuracy and completeness, and I agree with the above. - Reuben Likes, MD

## 2023-03-08 ENCOUNTER — Other Ambulatory Visit: Payer: Self-pay | Admitting: Medical Genetics

## 2023-03-08 DIAGNOSIS — Z006 Encounter for examination for normal comparison and control in clinical research program: Secondary | ICD-10-CM

## 2023-03-29 ENCOUNTER — Other Ambulatory Visit: Payer: Self-pay | Admitting: Psychiatry

## 2023-03-29 DIAGNOSIS — G4700 Insomnia, unspecified: Secondary | ICD-10-CM

## 2023-04-02 ENCOUNTER — Ambulatory Visit (INDEPENDENT_AMBULATORY_CARE_PROVIDER_SITE_OTHER): Payer: Medicare Other | Admitting: Family Medicine

## 2023-04-03 ENCOUNTER — Ambulatory Visit (INDEPENDENT_AMBULATORY_CARE_PROVIDER_SITE_OTHER): Payer: Medicare Other | Admitting: Family Medicine

## 2023-04-04 ENCOUNTER — Ambulatory Visit (INDEPENDENT_AMBULATORY_CARE_PROVIDER_SITE_OTHER): Payer: Medicare Other | Admitting: Adult Health

## 2023-04-09 ENCOUNTER — Encounter (INDEPENDENT_AMBULATORY_CARE_PROVIDER_SITE_OTHER): Payer: Self-pay | Admitting: Adult Health

## 2023-04-09 ENCOUNTER — Ambulatory Visit (INDEPENDENT_AMBULATORY_CARE_PROVIDER_SITE_OTHER): Payer: Medicare Other | Admitting: Adult Health

## 2023-04-09 VITALS — BP 112/80 | HR 68 | Temp 97.7°F | Ht 66.0 in | Wt 193.0 lb

## 2023-04-09 DIAGNOSIS — Z6831 Body mass index (BMI) 31.0-31.9, adult: Secondary | ICD-10-CM | POA: Diagnosis not present

## 2023-04-09 DIAGNOSIS — E669 Obesity, unspecified: Secondary | ICD-10-CM | POA: Diagnosis not present

## 2023-04-09 DIAGNOSIS — E559 Vitamin D deficiency, unspecified: Secondary | ICD-10-CM | POA: Diagnosis not present

## 2023-04-09 DIAGNOSIS — R7303 Prediabetes: Secondary | ICD-10-CM

## 2023-04-09 DIAGNOSIS — Z6833 Body mass index (BMI) 33.0-33.9, adult: Secondary | ICD-10-CM

## 2023-04-09 MED ORDER — METFORMIN HCL 500 MG PO TABS: ORAL_TABLET | ORAL | 0 refills | Status: DC

## 2023-04-09 NOTE — Progress Notes (Signed)
WEIGHT SUMMARY AND BIOMETRICS  Vitals Temp: 97.7 F (36.5 C) BP: 112/80 Pulse Rate: 68 SpO2: 98 %   Anthropometric Measurements Height: 5\' 6"  (1.676 m) Weight: 193 lb (87.5 kg) BMI (Calculated): 31.17 Weight at Last Visit: 200lb Weight Lost Since Last Visit: 7lb Weight Gained Since Last Visit: 0 Starting Weight: 207lb Total Weight Loss (lbs): 14 lb (6.35 kg)   Body Composition  Body Fat %: 42.7 % Fat Mass (lbs): 82.8 lbs Muscle Mass (lbs): 105.4 lbs Total Body Water (lbs): 70.2 lbs Visceral Fat Rating : 11   Other Clinical Data Fasting: yes Labs: no Today's Visit #: 6 Starting Date: 11/28/22    Chief Complaint:   OBESITY Maureen Randall is here to discuss her progress with her obesity treatment plan. She is on the practicing portion control and making smarter food choices, such as increasing vegetables and decreasing simple carbohydrates and states she is following her eating plan approximately 60 % of the time. She states she is exercising Walking 30 minutes 3-4 times per week.   Interim History:  Wegovy Rx provided at last OV with [facility] on 03/04/2023 Medicare will not cover Rx Maureen Randall applied for and received [employer]. Cost per month with Coupon $650/month Her husbands truck required costly repairs and now Maureen Randall are on strike. These events have made Maureen Randall therapy with coupon cost prohibitive.  Reviewed Bioimpedance results with pt: Muscle Mass: -0.8 lb Adipose Mass: -6 lbs  Of note- she has stopped drinking Pepsi 20 oz Pepsi 250 cal with 69g CHO  Subjective:   1. Vitamin D deficiency  Latest Reference Range & Units 11/28/22 08:44  Vitamin D, 25-Hydroxy 30.0 - 100.0 ng/mL 59.4   Level stable without oral Vit D supplementation She endorses stable energy levels  2. Prediabetes Lab Results  Component Value Date   HGBA1C 5.6 11/05/2022   HGBA1C 5.5 12/14/2021   HGBA1C 5.5 09/25/2019     Latest Reference Range & Units 11/05/22 09:08   GFR >60.00 mL/min 85.72   Wegovy Rx provided at last OV with [facility] on 03/04/2023 Medicare will not cover Rx Maureen Randall applied for and received [employer]. Cost per month with Coupon $650/month Her husbands truck required costly repairs and now Maureen Randall are on strike. These events have made Maureen Randall therapy with coupon cost prohibitive.  Discussed risks/benefits of Metformin therapy- she is agreeable to start  Assessment/Plan:   1. Vitamin D deficiency Check Labs at next OV  2. Prediabetes Check Labs at next OV Start  metFORMIN (GLUCOPHAGE) 500 MG tablet 1/2 tab daily with food for 1 week, then increase to 1 tab daily with food- hold at this dose Dispense: 30 tablet, Refills: 0 ordered   3. Obesity (BMI 30-39.9) When able- Start Wegovy 0.25mg  therapy as directed  Maureen Randall is currently in the action stage of change. As such, her goal is to continue with weight loss efforts. She has agreed to practicing portion control and making smarter food choices, such as increasing vegetables and decreasing simple carbohydrates. Strive for at least 130g protein/day  Exercise goals: For substantial health benefits, adults should do at least 150 minutes (2 hours and 30 minutes) a week of moderate-intensity, or 75 minutes (1 hour and 15 minutes) a week of vigorous-intensity aerobic physical activity, or an equivalent combination of moderate- and vigorous-intensity aerobic activity. Aerobic activity should be performed in episodes of at least 10 minutes, and preferably, it should be spread throughout the week.  Behavioral modification strategies:  increasing lean protein intake, decreasing simple carbohydrates, increasing vegetables, increasing water intake, no skipping meals, meal planning and cooking strategies, and planning for success.  Maureen Randall has agreed to follow-up with our clinic in 3 weeks. She was informed of the importance of frequent follow-up visits to maximize her success with intensive  lifestyle modifications for her multiple health conditions.   Check Fasting Labs at next OV  Objective:   Blood pressure 112/80, pulse 68, temperature 97.7 F (36.5 C), height 5\' 6"  (1.676 m), weight 193 lb (87.5 kg), SpO2 98%. Body mass index is 31.15 kg/m.  General: Cooperative, alert, well developed, in no acute distress. HEENT: Conjunctivae and lids unremarkable. Cardiovascular: Regular rhythm.  Lungs: Normal work of breathing. Neurologic: No focal deficits.   Lab Results  Component Value Date   CREATININE 0.77 11/05/2022   BUN 18 11/05/2022   NA 140 11/05/2022   K 4.1 11/05/2022   CL 103 11/05/2022   CO2 28 11/05/2022   Lab Results  Component Value Date   ALT 17 11/05/2022   AST 19 11/05/2022   ALKPHOS 87 11/05/2022   BILITOT 0.5 11/05/2022   Lab Results  Component Value Date   HGBA1C 5.6 11/05/2022   HGBA1C 5.5 12/14/2021   HGBA1C 5.5 09/25/2019   HGBA1C 5.7 01/05/2019   Lab Results  Component Value Date   INSULIN 13.7 11/28/2022   Lab Results  Component Value Date   TSH 3.04 11/05/2022   Lab Results  Component Value Date   CHOL 218 (H) 11/28/2022   HDL 70 11/28/2022   LDLCALC 129 (H) 11/28/2022   TRIG 106 11/28/2022   CHOLHDL 2.9 07/10/2022   Lab Results  Component Value Date   VD25OH 59.4 11/28/2022   VD25OH 48.02 01/05/2019   Lab Results  Component Value Date   WBC 7.2 11/16/2022   HGB 12.5 11/16/2022   HCT 38.3 11/16/2022   MCV 93.2 11/16/2022   PLT 338 11/16/2022   Lab Results  Component Value Date   IRON 77 09/25/2019   TIBC 336 09/25/2019   FERRITIN 37 09/25/2019    Attestation Statements:   Reviewed by clinician on day of visit: allergies, medications, problem list, medical history, surgical history, family history, social history, and previous encounter notes.  I have reviewed the above documentation for accuracy and completeness, and I agree with the above. -  Ronald Vinsant d. Issa Kosmicki, NP-C

## 2023-04-25 DIAGNOSIS — M48062 Spinal stenosis, lumbar region with neurogenic claudication: Secondary | ICD-10-CM | POA: Insufficient documentation

## 2023-04-29 ENCOUNTER — Ambulatory Visit (INDEPENDENT_AMBULATORY_CARE_PROVIDER_SITE_OTHER): Payer: Medicare Other | Admitting: Adult Health

## 2023-04-29 ENCOUNTER — Encounter (INDEPENDENT_AMBULATORY_CARE_PROVIDER_SITE_OTHER): Payer: Self-pay | Admitting: Adult Health

## 2023-04-29 ENCOUNTER — Telehealth: Payer: Self-pay | Admitting: Family Medicine

## 2023-04-29 VITALS — BP 104/74 | HR 69 | Temp 97.7°F | Ht 66.0 in | Wt 195.0 lb

## 2023-04-29 DIAGNOSIS — R7303 Prediabetes: Secondary | ICD-10-CM

## 2023-04-29 DIAGNOSIS — E559 Vitamin D deficiency, unspecified: Secondary | ICD-10-CM

## 2023-04-29 DIAGNOSIS — E669 Obesity, unspecified: Secondary | ICD-10-CM

## 2023-04-29 DIAGNOSIS — Z6831 Body mass index (BMI) 31.0-31.9, adult: Secondary | ICD-10-CM | POA: Diagnosis not present

## 2023-04-29 DIAGNOSIS — E66811 Obesity, class 1: Secondary | ICD-10-CM

## 2023-04-29 MED ORDER — METFORMIN HCL 500 MG PO TABS: ORAL_TABLET | ORAL | 0 refills | Status: DC

## 2023-04-29 NOTE — Telephone Encounter (Signed)
Patient need lab orders.

## 2023-04-29 NOTE — Progress Notes (Signed)
WEIGHT SUMMARY AND BIOMETRICS  Vitals Temp: 97.7 F (36.5 C) BP: 104/74 Pulse Rate: 69 SpO2: 97 %   Anthropometric Measurements Height: 5\' 6"  (1.676 m) Weight: 195 lb (88.5 kg) BMI (Calculated): 31.49 Weight at Last Visit: 193lb Weight Lost Since Last Visit: 0 Weight Gained Since Last Visit: 2lb Starting Weight: 207lb Total Weight Loss (lbs): 12 lb (5.443 kg)   Body Composition  Body Fat %: 46 % Fat Mass (lbs): 90 lbs Muscle Mass (lbs): 100.2 lbs Total Body Water (lbs): 73.2 lbs Visceral Fat Rating : 12   Other Clinical Data Fasting: yes Labs: yes Today's Visit #: 7 Starting Date: 11/28/22    Chief Complaint:   OBESITY Maureen Randall is here to discuss her progress with her obesity treatment plan. She is on the practicing portion control and making smarter food choices, such as increasing vegetables and decreasing simple carbohydrates and states she is following her eating plan approximately 0 % of the time. She states she is exercising Walking 30 minutes 3-4 times per week.   Interim History:  Maureen Randall started Metformin and has titrated up to full 500mg  tablet daily. She reports reduction of evening cravings for cookies. She reports constipation that she has been treating with OTC Metamucil  She attended the [facility] over the weekend and indulged in "Fair Food", ie Funnel Cake and Sausage- she denies GI upset when consuming these foods.   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  Subjective:   1. Prediabetes Ms. Romaine started Metformin and has titrated up to full 500mg  tablet daily. She reports reduction of evening cravings for cookies. She reports constipation that she has been treating with OTC Metamucil  Due to cost she has not started once weekly Wegovy  2. Vitamin D  deficiency She endorses stable energy levels.  Latest Reference Range & Units 11/28/22 08:44  Vitamin D, 25-Hydroxy 30.0 - 100.0 ng/mL 59.4    Assessment/Plan:   1. Prediabetes Check Labs - Comprehensive metabolic panel; Future - Hemoglobin A1c - Insulin, random - Vitamin B12 Refill metFORMIN (GLUCOPHAGE) 500 MG tablet 1 tab daily with full meal Dispense: 30 tablet, Refills: 0 ordered   2. Vitamin D deficiency Check Labs - VITAMIN D 25 Hydroxy (Vit-D Deficiency, Fractures)  4. Obesity (BMI 30-39.9), Current BMI 31.49  Maureen Randall is not currently in the action stage of change. As such, her goal is to get back to weightloss efforts . She has agreed to practicing portion control and making smarter food choices, such as increasing vegetables and decreasing simple carbohydrates.   Exercise goals: All adults should avoid inactivity. Some physical activity is better than none, and adults who participate in any amount of physical activity gain some health benefits. Adults should also include muscle-strengthening activities that involve all major muscle groups on 2 or more days a week.  Behavioral modification strategies: increasing lean protein intake, decreasing simple carbohydrates, increasing vegetables, increasing water intake, decreasing eating out, no skipping meals, meal planning and cooking strategies, keeping healthy foods in the home, ways to avoid boredom eating, ways to avoid night time snacking, better snacking choices, emotional eating strategies, and planning for success.  Azaelia has agreed to follow-up with our clinic in 3 weeks. She was informed of the importance of frequent follow-up visits to maximize her success with intensive lifestyle modifications for  her multiple health conditions.   Maureen Randall was informed we would discuss her lab results at her next visit unless there is a critical issue that needs to be addressed sooner. Maureen Randall Endo agreed to keep her next visit at the agreed upon  time to discuss these results.  Objective:   Blood pressure 104/74, pulse 69, temperature 97.7 F (36.5 C), height 5\' 6"  (1.676 m), weight 195 lb (88.5 kg), SpO2 97%. Body mass index is 31.47 kg/m.  General: Cooperative, alert, well developed, in no acute distress. HEENT: Conjunctivae and lids unremarkable. Cardiovascular: Regular rhythm.  Lungs: Normal work of breathing. Neurologic: No focal deficits.   Lab Results  Component Value Date   CREATININE 0.77 11/05/2022   BUN 18 11/05/2022   NA 140 11/05/2022   K 4.1 11/05/2022   CL 103 11/05/2022   CO2 28 11/05/2022   Lab Results  Component Value Date   ALT 17 11/05/2022   AST 19 11/05/2022   ALKPHOS 87 11/05/2022   BILITOT 0.5 11/05/2022   Lab Results  Component Value Date   HGBA1C 5.6 11/05/2022   HGBA1C 5.5 12/14/2021   HGBA1C 5.5 09/25/2019   HGBA1C 5.7 01/05/2019   Lab Results  Component Value Date   INSULIN 13.7 11/28/2022   Lab Results  Component Value Date   TSH 3.04 11/05/2022   Lab Results  Component Value Date   CHOL 218 (H) 11/28/2022   HDL 70 11/28/2022   LDLCALC 129 (H) 11/28/2022   TRIG 106 11/28/2022   CHOLHDL 2.9 07/10/2022   Lab Results  Component Value Date   VD25OH 59.4 11/28/2022   VD25OH 48.02 01/05/2019   Lab Results  Component Value Date   WBC 7.2 11/16/2022   HGB 12.5 11/16/2022   HCT 38.3 11/16/2022   MCV 93.2 11/16/2022   PLT 338 11/16/2022   Lab Results  Component Value Date   IRON 77 09/25/2019   TIBC 336 09/25/2019   FERRITIN 37 09/25/2019    Attestation Statements:   Reviewed by clinician on day of visit: allergies, medications, problem list, medical history, surgical history, family history, social history, and previous encounter notes.  I have reviewed the above documentation for accuracy and completeness, and I agree with the above. -  Kadedra Vanaken d. Tyliek Timberman, NP-C

## 2023-04-30 ENCOUNTER — Encounter: Payer: Self-pay | Admitting: Family Medicine

## 2023-04-30 ENCOUNTER — Encounter (INDEPENDENT_AMBULATORY_CARE_PROVIDER_SITE_OTHER): Payer: Self-pay | Admitting: Adult Health

## 2023-04-30 LAB — HEMOGLOBIN A1C
Est. average glucose Bld gHb Est-mCnc: 117 mg/dL
Hgb A1c MFr Bld: 5.7 % — ABNORMAL HIGH (ref 4.8–5.6)

## 2023-04-30 LAB — VITAMIN B12: Vitamin B-12: 309 pg/mL (ref 232–1245)

## 2023-04-30 LAB — INSULIN, RANDOM: INSULIN: 8.2 u[IU]/mL (ref 2.6–24.9)

## 2023-04-30 LAB — VITAMIN D 25 HYDROXY (VIT D DEFICIENCY, FRACTURES): Vit D, 25-Hydroxy: 73.6 ng/mL (ref 30.0–100.0)

## 2023-05-02 ENCOUNTER — Telehealth: Payer: Self-pay | Admitting: Family Medicine

## 2023-05-02 ENCOUNTER — Other Ambulatory Visit: Payer: Medicare Other

## 2023-05-02 NOTE — Telephone Encounter (Signed)
Tried to call patient regarding lab appt today and complaint that no labs ordered.  No answer and no voicemail will continue to try and reach.

## 2023-05-06 ENCOUNTER — Other Ambulatory Visit
Admission: RE | Admit: 2023-05-06 | Discharge: 2023-05-06 | Disposition: A | Discharge status: Home / Self Care | Payer: Medicare Other | Source: Ambulatory Visit | Attending: Medical Genetics | Admitting: Medical Genetics

## 2023-05-06 DIAGNOSIS — Z006 Encounter for examination for normal comparison and control in clinical research program: Secondary | ICD-10-CM | POA: Insufficient documentation

## 2023-05-08 NOTE — Telephone Encounter (Signed)
Patient to be seen in office today

## 2023-05-09 ENCOUNTER — Ambulatory Visit: Payer: Medicare Other | Admitting: Family Medicine

## 2023-05-11 ENCOUNTER — Other Ambulatory Visit (INDEPENDENT_AMBULATORY_CARE_PROVIDER_SITE_OTHER): Payer: Self-pay | Admitting: Adult Health

## 2023-05-14 LAB — HELIX MOLECULAR SCREEN: Genetic Analysis Overall Interpretation: NEGATIVE

## 2023-05-14 LAB — GENECONNECT MOLECULAR SCREEN

## 2023-05-22 ENCOUNTER — Ambulatory Visit (INDEPENDENT_AMBULATORY_CARE_PROVIDER_SITE_OTHER): Payer: Medicare Other | Admitting: Adult Health

## 2023-05-27 ENCOUNTER — Other Ambulatory Visit (INDEPENDENT_AMBULATORY_CARE_PROVIDER_SITE_OTHER): Payer: Self-pay | Admitting: Adult Health

## 2023-06-18 ENCOUNTER — Other Ambulatory Visit: Payer: Self-pay | Admitting: *Deleted

## 2023-06-18 DIAGNOSIS — E785 Hyperlipidemia, unspecified: Secondary | ICD-10-CM

## 2023-06-19 ENCOUNTER — Ambulatory Visit: Payer: Medicare Other | Admitting: Psychiatry

## 2023-06-19 ENCOUNTER — Ambulatory Visit (INDEPENDENT_AMBULATORY_CARE_PROVIDER_SITE_OTHER): Payer: Medicare Other | Admitting: Adult Health

## 2023-06-19 ENCOUNTER — Telehealth: Payer: Self-pay | Admitting: Psychiatry

## 2023-06-19 ENCOUNTER — Encounter (INDEPENDENT_AMBULATORY_CARE_PROVIDER_SITE_OTHER): Payer: Self-pay | Admitting: Adult Health

## 2023-06-19 VITALS — BP 126/87 | HR 74 | Temp 97.8°F | Ht 66.0 in | Wt 191.0 lb

## 2023-06-19 DIAGNOSIS — E538 Deficiency of other specified B group vitamins: Secondary | ICD-10-CM | POA: Diagnosis not present

## 2023-06-19 DIAGNOSIS — R7303 Prediabetes: Secondary | ICD-10-CM

## 2023-06-19 DIAGNOSIS — Z6830 Body mass index (BMI) 30.0-30.9, adult: Secondary | ICD-10-CM

## 2023-06-19 DIAGNOSIS — E559 Vitamin D deficiency, unspecified: Secondary | ICD-10-CM

## 2023-06-19 DIAGNOSIS — Z6833 Body mass index (BMI) 33.0-33.9, adult: Secondary | ICD-10-CM

## 2023-06-19 DIAGNOSIS — E669 Obesity, unspecified: Secondary | ICD-10-CM | POA: Diagnosis not present

## 2023-06-19 MED ORDER — METFORMIN HCL 500 MG PO TABS: ORAL_TABLET | ORAL | 0 refills | Status: DC

## 2023-06-19 NOTE — Progress Notes (Signed)
WEIGHT SUMMARY AND BIOMETRICS  Vitals Temp: 97.8 F (36.6 C) BP: 126/87 Pulse Rate: 74 SpO2: 97 %   Anthropometric Measurements Height: 5\' 6"  (1.676 m) Weight: 191 lb (86.6 kg) BMI (Calculated): 30.84 Weight at Last Visit: 195lb Weight Lost Since Last Visit: 4lb Weight Gained Since Last Visit: 0 Starting Weight: 207lb Total Weight Loss (lbs): 16 lb (7.258 kg)   Body Composition  Body Fat %: 45.1 % Fat Mass (lbs): 86.2 lbs Muscle Mass (lbs): 99.6 lbs Total Body Water (lbs): 70.8 lbs Visceral Fat Rating : 11   Other Clinical Data Fasting: yes Labs: no Today's Visit #: 8 Starting Date: 11/28/22    Chief Complaint:   OBESITY Maureen Randall is here to discuss her progress with her obesity treatment plan. She is on the practicing portion control and making smarter food choices, such as increasing vegetables and decreasing simple carbohydrates and states she is following her eating plan approximately 50 % of the time. She states she is exercising Walking 25 minutes 5-7 times per week.   Interim History:  06/27/2023-  INFERIOR VENACAVA FILTER PLACEMENT.   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 She will have family members with her during her recovery. She will be restricted from driving 6-8 weeks after Laminectomy  Subjective:   1. B12 deficiency Discussed Labs  Latest Reference Range & Units 04/29/23 08:04  Vitamin B12 232 - 1,245 pg/mL 309   Low normal She endorses persistent fatigue She is agreeable to starting oral replacement therapy  2. Vit D Def   Latest Reference Range & Units 04/29/23 08:04  Vitamin D, 25-Hydroxy 30.0 - 100.0 ng/mL 73.6   Level  at goal. She denies any Vit D Supplementation  3. Prediabetes Discussed Labs  Latest Reference Range & Units 04/29/23 08:04  Hemoglobin A1C 4.8 - 5.6 % 5.7 (H)  Est. average glucose Bld gHb Est-mCnc mg/dL 161  INSULIN 2.6 - 09.6 uIU/mL 8.2  (H): Data is abnormally high  Latest Reference Range & Units 11/28/22 08:44 04/29/23 08:04  INSULIN 2.6 - 24.9 uIU/mL 13.7 8.2   A1c worsened with improved insulin level. She is on daily Metformin 500mg  with breakfast- denies GI upset  Assessment/Plan:   1. B12 deficiency Start oral OTC B12 500 mcg once daily tab  2.Vit D Def Monitor labs  3. Prediabetes Restart Cat 3 Meal Plan  4. Obesity (BMI 30-39.9), Current BMI 30.84  Elesha is currently in the action stage of change. As such, her goal is to maintain weight for now. She has agreed to the Category 3 Plan.   Exercise goals: No exercise has been prescribed at this time.  Behavioral modification strategies: increasing lean protein intake, decreasing simple carbohydrates, increasing vegetables, increasing water intake, meal planning and cooking strategies, keeping healthy foods in the home, ways to avoid boredom eating, and planning for success.  Doriane has agreed to follow-up with our clinic in 8 weeks. She was informed of the importance of frequent follow-up visits to maximize her success with intensive lifestyle modifications for her multiple health conditions.   Objective:   Blood pressure 126/87, pulse 74, temperature 97.8 F (36.6 C), height 5\' 6"  (1.676 m), weight 191 lb (86.6  kg), SpO2 97%. Body mass index is 30.83 kg/m.  General: Cooperative, alert, well developed, in no acute distress. HEENT: Conjunctivae and lids unremarkable. Cardiovascular: Regular rhythm.  Lungs: Normal work of breathing. Neurologic: No focal deficits.   Lab Results  Component Value Date   CREATININE 0.77 11/05/2022   BUN 18 11/05/2022   NA 140 11/05/2022   K 4.1 11/05/2022   CL 103 11/05/2022    CO2 28 11/05/2022   Lab Results  Component Value Date   ALT 17 11/05/2022   AST 19 11/05/2022   ALKPHOS 87 11/05/2022   BILITOT 0.5 11/05/2022   Lab Results  Component Value Date   HGBA1C 5.7 (H) 04/29/2023   HGBA1C 5.6 11/05/2022   HGBA1C 5.5 12/14/2021   HGBA1C 5.5 09/25/2019   HGBA1C 5.7 01/05/2019   Lab Results  Component Value Date   INSULIN 8.2 04/29/2023   INSULIN 13.7 11/28/2022   Lab Results  Component Value Date   TSH 3.04 11/05/2022   Lab Results  Component Value Date   CHOL 218 (H) 11/28/2022   HDL 70 11/28/2022   LDLCALC 129 (H) 11/28/2022   TRIG 106 11/28/2022   CHOLHDL 2.9 07/10/2022   Lab Results  Component Value Date   VD25OH 73.6 04/29/2023   VD25OH 59.4 11/28/2022   VD25OH 48.02 01/05/2019   Lab Results  Component Value Date   WBC 7.2 11/16/2022   HGB 12.5 11/16/2022   HCT 38.3 11/16/2022   MCV 93.2 11/16/2022   PLT 338 11/16/2022   Lab Results  Component Value Date   IRON 77 09/25/2019   TIBC 336 09/25/2019   FERRITIN 37 09/25/2019   Attestation Statements:   Reviewed by clinician on day of visit: allergies, medications, problem list, medical history, surgical history, family history, social history, and previous encounter notes.  I have reviewed the above documentation for accuracy and completeness, and I agree with the above. -  Montgomery Rothlisberger d. Nekisha Mcdiarmid,NP-C

## 2023-06-19 NOTE — Telephone Encounter (Signed)
pt thought today appt was virtual. you have nothing before 12/30 per her request. she is having back surgery 07/08/23 but made follow up virtual due to not driving for 6-8 weeks. wanted you to be aware.

## 2023-06-19 NOTE — Telephone Encounter (Signed)
Noted  

## 2023-07-08 HISTORY — PX: BACK SURGERY: SHX140

## 2023-07-09 DIAGNOSIS — R269 Unspecified abnormalities of gait and mobility: Secondary | ICD-10-CM | POA: Insufficient documentation

## 2023-07-11 ENCOUNTER — Telehealth: Payer: Self-pay

## 2023-07-11 NOTE — Telephone Encounter (Signed)
 Copied from CRM [id number]. Topic: Clinical - Home Health Verbal Orders >> Jul 11, 2023 10:09 AM Russell PARAS wrote: Caller/Agency: Hayden(physical therapist) [facility] Callback Number: [phone] Service Requested: Physical Therapy Frequency: Is needing to request approval of new start date for physical therapy, due to patient request. New start date is 07/16/2023.  Any new concerns about the patient? No

## 2023-07-17 NOTE — Telephone Encounter (Signed)
 Left message to return call to our office.

## 2023-07-22 ENCOUNTER — Encounter: Payer: Self-pay | Admitting: Family Medicine

## 2023-07-22 DIAGNOSIS — K219 Gastro-esophageal reflux disease without esophagitis: Secondary | ICD-10-CM

## 2023-07-22 MED ORDER — FAMOTIDINE 20 MG PO TABS: 20.0000 mg | ORAL_TABLET | Freq: Two times a day (BID) | ORAL | 0 refills | Status: DC

## 2023-07-23 ENCOUNTER — Encounter: Payer: Self-pay | Admitting: Nurse Practitioner

## 2023-07-23 ENCOUNTER — Other Ambulatory Visit: Payer: Self-pay | Admitting: Family Medicine

## 2023-07-23 ENCOUNTER — Ambulatory Visit: Payer: Self-pay | Admitting: Family Medicine

## 2023-07-23 ENCOUNTER — Telehealth (INDEPENDENT_AMBULATORY_CARE_PROVIDER_SITE_OTHER): Payer: Medicare Other | Admitting: Nurse Practitioner

## 2023-07-23 VITALS — Ht 66.0 in | Wt 191.0 lb

## 2023-07-23 DIAGNOSIS — K219 Gastro-esophageal reflux disease without esophagitis: Secondary | ICD-10-CM

## 2023-07-23 DIAGNOSIS — L509 Urticaria, unspecified: Secondary | ICD-10-CM | POA: Diagnosis not present

## 2023-07-23 MED ORDER — FAMOTIDINE 20 MG PO TABS: 20.0000 mg | ORAL_TABLET | Freq: Every day | ORAL | 11 refills | Status: DC

## 2023-07-23 MED ORDER — PREDNISONE 20 MG PO TABS: 40.0000 mg | ORAL_TABLET | Freq: Every day | ORAL | 0 refills | Status: DC

## 2023-07-23 NOTE — Assessment & Plan Note (Signed)
 Recent onset of hives and lip swelling, possibly due to changes in medications or laundry detergent. No respiratory symptoms or throat swelling. Continue Zyrtec once daily and Benadryl every four-six hours. Will start Prednisone . Continue Pepcid  as prescribed. Seek immediate medical attention if symptoms worsen, particularly lip swelling or if respiratory symptoms develop.

## 2023-07-23 NOTE — Telephone Encounter (Signed)
 Called the pharmacy and I let them know that we sent in a new Rx for 1 tablet at night.

## 2023-07-23 NOTE — Telephone Encounter (Signed)
Pt already seen in office.  

## 2023-07-23 NOTE — Telephone Encounter (Signed)
 [facility]) calling for clarification on Pepcid  order. Two directions sent in.   Callback number: [phone]           Copied from CRM [id number]. Topic: Clinical - Medication Question >> Jul 23, 2023  9:11 AM Isabell A wrote: Reason for CRM: Lanae from [facility] pharmacy needs clarification on the directions for famotidine  (PEPCID ) 20 MG tablet - there was 2 directions sent in.   Callback number: [phone] Answer Assessment - Initial Assessment Questions 1. NAME of MEDICINE: What medicine(s) are you calling about?     Pepcid  2. QUESTION: What is your question? (e.g., double dose of medicine, side effect)     Clarification on directions  3. PRESCRIBER: Who prescribed the medicine? Reason: if prescribed by specialist, call should be referred to that group.     [place] 4. SYMPTOMS: Do you have any symptoms? If Yes, ask: What symptoms are you having?  How bad are the symptoms (e.g., mild, moderate, severe)     na 5. PREGNANCY:  Is there any chance that you are pregnant? When was your last menstrual period?     na  Protocols used: Medication Question Call-A-AH

## 2023-07-23 NOTE — Progress Notes (Signed)
 MyChart Video Visit    Virtual Visit via Video Note   This visit type was conducted because this format is felt to be most appropriate for this patient at this time. Physical exam was limited by quality of the video and audio technology used for the visit. CMA was able to get the patient set up on a video visit.  Patient location: Home. Patient and provider in visit Provider location: Office  I discussed the limitations of evaluation and management by telemedicine and the availability of in person appointments. The patient expressed understanding and agreed to proceed.  Visit Date: 07/23/2023  Today's healthcare provider: Leron Glance, NP     Subjective:    Patient ID: Maureen Randall, female    DOB: [birth year], 58 y.o.   MRN: [id number]  Chief Complaint  Patient presents with   Urticaria    Since Friday     Urticaria  Discussed the use of AI scribe software for clinical note transcription with the patient, who gave verbal consent to proceed.  History of Present Illness   The patient, with a history of lumbar spinal fusion, underwent a revision surgery extending the fusion to L3 on December 30th. Postoperatively, they experienced severe pain, nausea, and inflammation, particularly in the groin area, which was managed with a course of prednisone .  Approximately a week after completing the prednisone , the patient developed hives, initially localized to the abdominal and gluteal regions, and around the surgical incision. The hives were intensely pruritic, prompting the patient to self-medicate with Zyrtec, Benadryl, Allegra, and topical anti-itch cream. Despite these measures, the hives persisted and became more inflamed.  On the day of the consultation, the patient noticed lip swelling and tingling, which was a new development. They denied any associated dyspnea, throat itchiness, or difficulty swallowing.  In the context of these symptoms, the patient reported a recent change  in laundry detergent, which they have since reverted, and the use of metaxalone, a muscle relaxer prescribed by their pain management doctor. The metaxalone was taken as needed for a couple of months, but usage increased in the days leading up to the onset of the hives. The patient has since discontinued the metaxalone, but the hives and lip swelling persist.      Past Medical History:  Diagnosis Date   Abnormal glucose    Adenoma of left adrenal gland 07/26/2020   Anterior to posterior tear of superior glenoid labrum of right shoulder 11/03/2020   Anxiety    Arthralgia of multiple joints 08/31/2015   Arthritis    osteoarthritis-joints, feet, shoulder   Asthma    previously diagnosed after a bout of bronchitis/ no problems since   Biceps tendinitis 09/10/2019   Bulging of cervical intervertebral disc without myelopathy    C6-C7   Bursitis    b/l hips   Chronic deep vein thrombosis (DVT) of both lower extremities (HCC)    Chronic pain syndrome    Closed fracture of metatarsal bone 08/06/2017   COPD (chronic obstructive pulmonary disease) (HCC) 2004   patient states probably bronchitis   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   Generalized osteoarthritis    GERD (gastroesophageal reflux disease)  H/O nasal septoplasty 09/10/2019   Headache, post-myelogram 09/11/2021   Headache, post-myelogram 09/11/2021   Hiatal hernia    History of lumbar fusion 01/29/2018   History of right hip replacement (12/2017) 01/29/2018   Hyperlipidemia    Increased band cell count 08/31/2015   Infection of superficial incisional surgical site after procedure 06/07/2021   IUD  threads lost 04/30/2018   Left leg DVT (HCC)    fem/pop 12/07/20 after right shoulder surgery 11/30/20   Low back pain, chronic    Menopausal flushing 11/30/2020   Mood swings 03/15/2021   Neuropathy    post surgery   Obesity    Open wound 07/04/2021   Open wound of left lower leg 06/07/2021   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 08/17/2019   Peroneal neuropathy, left    Pneumonia    11/2021 seen at [facility]   PONV (postoperative nausea and vomiting)    Postoperative nausea and vomiting    Preoperative clearance 09/10/2019   PVD (peripheral vascular disease) (HCC)    Recurrent falls    S/P shoulder replacement, right 12/22/2021   Sinusitis    Special screening for malignant neoplasms, colon    Spondylolisthesis    Strain of foot 01/17/2017   Venous insufficiency of both lower extremities 10/20/2018    Past Surgical History:  Procedure Laterality Date   ABLATION     veins in lower legs   BACK SURGERY  07/2014   lumbar spine/ fusion 1 level   BREAST BIOPSY Left 2011   neg   CARPAL TUNNEL RELEASE Right 12/2015   [facility]   CARPAL TUNNEL RELEASE Left 12/2016   CERVICAL DISC ARTHROPLASTY N/A 09/05/2022   Procedure: CERVICAL SIX-CERVICAL SEVEN ARTIFICIAL DISC REPLACEMENT;  Surgeon: Gillie Duncans, MD;  Location: [facility] OR;  Service: Neurosurgery;  Laterality: N/A;  RM 18/3C   COLONOSCOPY WITH PROPOFOL  N/A 02/29/2020   Procedure: COLONOSCOPY WITH PROPOFOL ;  Surgeon: Jinny Carmine, MD;  Location: [facility];  Service: Endoscopy;  Laterality: N/A;   ESOPHAGOGASTRODUODENOSCOPY (EGD) WITH PROPOFOL  N/A 02/29/2020   Procedure: ESOPHAGOGASTRODUODENOSCOPY (EGD) WITH PROPOFOL ;  Surgeon: Jinny Carmine, MD;  Location: [facility];  Service: Endoscopy;  Laterality: N/A;   FOOT SURGERY Right 2015   broken navicular bone   HIP ARTHROSCOPY Right 05/2011   HIP SURGERY     left hip surgery 05/2018   HYSTEROSCOPY N/A 11/22/2022   Procedure: HYSTEROSCOPY, IUD  REMOVAL;  Surgeon: Verdon Keen, MD;  Location: [facility] ORS;  Service: Gynecology;  Laterality: N/A;   JOINT REPLACEMENT Bilateral 12/2017 & 05/2018   hip   L5-S1 OLIF Dr. Royden Schneider back surgery 09/2019  09/28/2019   left ganglion cyst removal     12/23/19 Dr. Lilli   MASS EXCISION Left 12/17/2019   Procedure: EXCISION TUMOR FOOT DEEP LEFT;  Surgeon: Lilli Cough, DPM;  Location: [facility];  Service: Podiatry;  Laterality: Left;  LMA W/LOCAL   NASAL TURBINATE REDUCTION Bilateral 06/12/2019   Procedure: TURBINATE REDUCTION/SUBMUCOSAL RESECTION;  Surgeon: Herminio Miu, MD;  Location: [facility];  Service: ENT;  Laterality: Bilateral;   REPLACEMENT TOTAL HIP W/  RESURFACING IMPLANTS Right 12/2017   [facility]   right shoulder surgery     11/28/20 for bone spur, torn labrum, re attached biceps tendon, tear Dr. Kay   right shoulder surgery     12/22/21 total shoulder replacement [facility] Dr. Kay FRIED CUFF REPAIR     Dr. Kay in 04/2019  SCLEROTHERAPY     veins in lower legs   SEPTOPLASTY Bilateral 06/12/2019   Procedure: SEPTOPLASTY;  Surgeon: Herminio Miu, MD;  Location: [facility];  Service: ENT;  Laterality: Bilateral;   SHOULDER SURGERY Right 12/2015   [facility] torn rotator cuff   SPINAL FUSION  2016   [facility] lumbar   TOTAL SHOULDER ARTHROPLASTY Right 12/22/2021   Procedure: TOTAL SHOULDER ARTHROPLASTY;  Surgeon: Kay Kemps, MD;  Location: [facility];  Service: Orthopedics;  Laterality: Right;  with ISB    Family History  Problem Relation Age of Onset   Breast cancer Maternal Aunt    Breast cancer Paternal Aunt    Dementia Paternal Aunt    COPD Mother        quit smoking after 60 years   Arthritis Mother    Osteoarthritis Mother    Dementia Father        symptoms started in his 2s   Alzheimer's disease Father    Rheum arthritis Father    Rheum arthritis Other        FH   Dementia Other         all my dad's side of the family   Other Maternal Grandmother        got dementia as she got older    Dementia Paternal Grandmother     Social History   Socioeconomic History   Marital status: Married    Spouse name: fredrick   Number of children: 1   Years of education: Not on file   Highest education level: 12th grade  Occupational History    Comment: full time  Tobacco Use   Smoking status: Never   Smokeless tobacco: Never  Vaping Use   Vaping status: Never Used  Substance and Sexual Activity   Alcohol use: Not Currently    Alcohol/week: 1.0 standard drink of alcohol    Types: 1 Cans of beer per week    Comment: may have drink 1x/month   Drug use: Never   Sexual activity: Yes  Other Topics Concern   Not on file  Social History Narrative   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    Married 1 child    Lives at home with her husband   Right handed   Caffeine: maybe 1 cup/day (pepsi)   Social Drivers of Health   Financial Resource Strain: Medium Risk (11/04/2022)   Overall Financial Resource Strain (CARDIA)    Difficulty of Paying Living Expenses: Somewhat hard  Food Insecurity: Low Risk  (07/08/2023)   Received from [facility]   Hunger Vital Sign    Worried About Running Out of Food in the Last Year: Never true    Ran Out of Food in the Last Year: Never true  Transportation Needs: No Transportation Needs (07/08/2023)   Received from [facility]    In the past 12 months, has lack of reliable transportation kept you from medical appointments, meetings, work or from getting things needed for daily living? : No  Physical Activity: Unknown (11/04/2022)   Exercise Vital Sign    Days of Exercise per Week: 0 days    Minutes of Exercise per Session: Not on file  Stress: No Stress Concern Present (11/04/2022)   [employer] of Occupational Health - Occupational Stress Questionnaire    Feeling of Stress : Not at all  Social Connections:  Unknown (11/04/2022)   Social Connection and Isolation Panel [NHANES]    Frequency of  Communication with Friends and Family: More than three times a week    Frequency of Social Gatherings with Friends and Family: Patient declined    Attends Religious Services: Patient declined    Database Administrator or Organizations: No    Attends Engineer, Structural: Not on file    Marital Status: Married  Catering Manager Violence: Not At Risk (04/30/2018)   Humiliation, Afraid, Rape, and Kick questionnaire    Fear of Current or Ex-Partner: No    Emotionally Abused: No    Physically Abused: No    Sexually Abused: No    Outpatient Medications Prior to Visit  Medication Sig Dispense Refill   baclofen  (LIORESAL ) 10 MG tablet Take 1 tablet (10 mg total) by mouth 3 (three) times daily as needed for muscle spasms. 60 each 1   DULoxetine  (CYMBALTA ) 60 MG capsule TAKE 1 CAPSULE BY MOUTH DAILY 90 capsule 1   estradiol  (ESTRACE ) 0.1 MG/GM vaginal cream Place 1 Applicatorful vaginally once a week.     ezetimibe  (ZETIA ) 10 MG tablet Take 1 tablet (10 mg total) by mouth daily. 90 tablet 3   famotidine  (PEPCID ) 20 MG tablet Take 1 tablet (20 mg total) by mouth at bedtime. 30 tablet 11   fluticasone  (FLONASE ) 50 MCG/ACT nasal spray SHAKE LIQUID AND USE 1 TO 2 SPRAYS IN EACH NOSTRIL DAILY AS NEEDED. MAX DOSE 2 SPRAYS IN EACH NOSTRIL 16 g 11   omeprazole  (PRILOSEC) 40 MG capsule TAKE 1 CAPSULE BY MOUTH 2 TIMES A DAY BEFORE MEALS 60 capsule 5   oxyCODONE -acetaminophen  (PERCOCET) 10-325 MG tablet Take 1 tablet by mouth in the morning, at noon, in the evening, and at bedtime.     pregabalin  (LYRICA ) 50 MG capsule Take 100 mg by mouth 2 (two) times daily.     traZODone  (DESYREL ) 100 MG tablet TAKE TWO TABLETS BY MOUTH EVERY NIGHT AT BEDTIME AS NEEDED FOR SLEEP 180 tablet 2   valACYclovir  (VALTREX ) 500 MG tablet Take 500 mg by mouth 2 (two) times daily as needed (outbreaks).     acetaminophen  (TYLENOL ) 650 MG CR  tablet Take 650 mg by mouth every 8 (eight) hours as needed (pain/headaches.). (Patient not taking: Reported on 07/23/2023)     Buprenorphine  HCl (BELBUCA ) 450 MCG FILM Place 450 mcg inside cheek in the morning and at bedtime.     cetirizine (ZYRTEC) 10 MG tablet Take 10 mg by mouth daily as needed for allergies.     diclofenac  Sodium (VOLTAREN ) 1 % GEL APPLY 2 GRAMS TO UPPER BODY FOUR TIMES DAILY AS NEEDED AND 4 GRAMS TO LOWER BODY FOUR TIMES DAILY AS NEEDED (Patient not taking: Reported on 07/23/2023) 100 g 11   hydrOXYzine  (ATARAX ) 10 MG tablet TAKE 1 TABLET BY MOUTH DAILY AS NEEDED FOR ANXIETY. (Patient not taking: Reported on 07/23/2023) 90 tablet 1   metFORMIN  (GLUCOPHAGE ) 500 MG tablet 1 tab daily with full meal (Patient not taking: Reported on 07/23/2023) 60 tablet 0   naloxone (NARCAN) nasal spray 4 mg/0.1 mL Place 0.4 mg into the nose once.     nitrofurantoin  (MACRODANTIN ) 50 MG capsule Take 50 mg by mouth 4 (four) times daily.     ondansetron  (ZOFRAN -ODT) 8 MG disintegrating tablet Take 1 tablet (8 mg total) by mouth every 8 (eight) hours as needed for nausea or vomiting. 30 tablet 0   psyllium (METAMUCIL SMOOTH TEXTURE) 28 % packet Take 1 packet by mouth every other day.     rivaroxaban  (XARELTO ) 10 MG  TABS tablet Take 10 mg by mouth in the morning. (1000)     saccharomyces boulardii (FLORASTOR) 250 MG capsule Take 1 capsule (250 mg total) by mouth daily. 90 capsule 0   Semaglutide -Weight Management (WEGOVY ) 0.25 MG/0.5ML SOAJ Inject 0.25 mg into the skin once a week. (Patient not taking: Reported on 07/23/2023) 2 mL 0   sucralfate  (CARAFATE ) 1 g tablet TAKE 1 TABLET BY MOUTH 4 TIMES A DAY 120 tablet 11   TURMERIC PO Take 2,000 mg by mouth in the morning. 1000 mg/capsule     No facility-administered medications prior to visit.    Allergies  Allergen Reactions   Amoxicillin Hives   Sulfamethoxazole-Trimethoprim Hives    Septra,   Rosuvastatin      Myalgia  Other Reaction(s):  Myalgias   Sulfamethoxazole Hives   Trimethoprim Hives   Zanaflex [Tizanidine] Other (See Comments)    Weak muscles     Keflex [Cephalexin] Rash         ROS See HPI     Objective:    Physical Exam  Ht 5' 6 (1.676 m)   Wt 191 lb (86.6 kg)   BMI 30.83 kg/m  Wt Readings from Last 3 Encounters:  07/23/23 191 lb (86.6 kg)  06/19/23 191 lb (86.6 kg)  04/29/23 195 lb (88.5 kg)   GENERAL: alert, oriented, appears well and in no acute distress   HEENT: atraumatic, conjunttiva clear, no obvious abnormalities on inspection of external nose and ears   NECK: normal movements of the head and neck   LUNGS: on inspection no signs of respiratory distress, breathing rate appears normal, no obvious gross SOB, gasping or wheezing   CV: no obvious cyanosis   MS: moves all visible extremities without noticeable abnormality   PSYCH/NEURO: pleasant and cooperative, no obvious depression or anxiety, speech and thought processing grossly intact    Assessment & Plan:   Problem List Items Addressed This Visit       Musculoskeletal and Integument   Urticaria - Primary   Recent onset of hives and lip swelling, possibly due to changes in medications or laundry detergent. No respiratory symptoms or throat swelling. Continue Zyrtec once daily and Benadryl every four-six hours. Will start Prednisone . Continue Pepcid  as prescribed. Seek immediate medical attention if symptoms worsen, particularly lip swelling or if respiratory symptoms develop.       Relevant Medications   predniSONE  (DELTASONE ) 20 MG tablet    I am having Mareesa A. Whalin start on predniSONE . I am also having her maintain her estradiol , oxyCODONE -acetaminophen , pregabalin , cetirizine, valACYclovir , acetaminophen , Belbuca , rivaroxaban , hydrOXYzine , TURMERIC PO, psyllium, baclofen , naloxone, ondansetron , ezetimibe , saccharomyces boulardii, fluticasone , nitrofurantoin , sucralfate , omeprazole , diclofenac  Sodium, DULoxetine ,  Wegovy , traZODone , metFORMIN , and famotidine .  Meds ordered this encounter  Medications   predniSONE  (DELTASONE ) 20 MG tablet    Sig: Take 2 tablets (40 mg total) by mouth daily.    Dispense:  10 tablet    Refill:  0    Supervising Provider:   MARIBETH, ERIC G [[id number]]   I discussed the assessment and treatment plan with the patient. The patient was provided an opportunity to ask questions and all were answered. The patient agreed with the plan and demonstrated an understanding of the instructions.   The patient was advised to call back or seek an in-person evaluation if the symptoms worsen or if the condition fails to improve as anticipated.   Leron Glance, NP [facility] [phone] (phone) [phone] (fax)  [facility]

## 2023-07-23 NOTE — Telephone Encounter (Signed)
 Per Dr. Clent Ridges ok to start PT on a different date. Called and left a vm giving [facility] the  verbal order to start PT on a different date.[name]

## 2023-07-23 NOTE — Telephone Encounter (Signed)
 I am sending to patients PCP to clarify directions as it appears this may have been refilled per the protocol under my name.

## 2023-07-26 ENCOUNTER — Encounter: Payer: Self-pay | Admitting: Psychiatry

## 2023-07-26 ENCOUNTER — Telehealth (INDEPENDENT_AMBULATORY_CARE_PROVIDER_SITE_OTHER): Payer: Medicare Other | Admitting: Psychiatry

## 2023-07-26 DIAGNOSIS — G8918 Other acute postprocedural pain: Secondary | ICD-10-CM | POA: Diagnosis not present

## 2023-07-26 DIAGNOSIS — G4701 Insomnia due to medical condition: Secondary | ICD-10-CM | POA: Diagnosis not present

## 2023-07-26 DIAGNOSIS — F411 Generalized anxiety disorder: Secondary | ICD-10-CM

## 2023-07-26 MED ORDER — DULOXETINE HCL 60 MG PO CPEP: ORAL_CAPSULE | ORAL | 3 refills | Status: DC

## 2023-07-26 NOTE — Progress Notes (Signed)
Virtual Visit via Video Note  I connected with Maureen Randall on 07/26/23 at 10:30 AM EST by a video enabled telemedicine application and verified that I am speaking with the correct person using two identifiers.  Location Provider Location : [facility] Patient Location : Home  Participants: Patient , Provider   I discussed the limitations of evaluation and management by telemedicine and the availability of in person appointments. The patient expressed understanding and agreed to proceed.   I discussed the assessment and treatment plan with the patient. The patient was provided an opportunity to ask questions and all were answered. The patient agreed with the plan and demonstrated an understanding of the instructions.   The patient was advised to call back or seek an in-person evaluation if the symptoms worsen or if the condition fails to improve as anticipated.   BH MD OP Progress Note  07/26/2023 10:56 AM Maureen Randall  MRN:  [id number]  Chief Complaint:  Chief Complaint  Patient presents with   Follow-up   Depression   Anxiety   Medication Refill   HPI: Maureen Randall is a 58 year old Caucasian female, lives in [place], on disability, has a history of GAD, chronic pain, insomnia, primary osteoarthritis, radiculopathy, spondylolisthesis, bilateral carpal tunnel syndrome, recent back surgery was evaluated by telemedicine today.  The patient, with a history of generalized anxiety disorder, insomnia, and chronic back pain, underwent a revision back surgery on December 30th.The surgery was reportedly more extensive than anticipated, involving the removal and replacement of rods and screws. Post-operatively, the patient experienced significant nausea and vomiting, followed by a bout of hives, suspected to be a reaction to one of her medications. These complications have since resolved with treatment, but the patient reports a slow and challenging recovery process, with ongoing pain and  difficulty mobilizing.  In the lead-up to the surgery, the patient experienced heightened anxiety, which she attributes to the increasing difficulty of surgeries as she ages. She has struggled to find a therapist who accepts Medicare, which has been a source of frustration. The patient has been on Cymbalta 60mg  for her anxiety, and she also takes hydroxyzine as needed. For insomnia, she has been prescribed trazodone 200mg  at bedtime as needed and melatonin 3mg .  The patient also has a history of gastritis, which she believes was exacerbated by the surgery, leading to the post-operative nausea and vomiting. She has been managing her pain with an increased dosage of her usual pain medication, and she also takes Lyrica. However, she reports that the pain medication is not as effective .  She plans to start with home healthcare until she is able to drive and attend therapy sessions at [facility], a facility she has had a positive experience with in the past.  The patient also has a history of weight issues and has been seeing a weight specialist. She has lost 20 pounds and has been prescribed metformin. She was offered [facility], but declined due to the cost.  Patient currently denies any suicidality, homicidality or perceptual disturbances.  Patient willing to establish care with a therapist and increase with not making further medication changes at this time especially given her current surgical pain and multiple other medications she is on.  Visit Diagnosis:    ICD-10-CM   1. GAD (generalized anxiety disorder)  F41.1     2. Insomnia due to medical condition  G47.01 DULoxetine (CYMBALTA) 60 MG capsule   pain      Past Psychiatric History: I have reviewed  past psychiatric history from progress note on 04/30/2018.  Past Medical History:  Past Medical History:  Diagnosis Date   Abnormal glucose    Adenoma of left adrenal gland 07/26/2020   Anterior to posterior tear of superior glenoid labrum of  right shoulder 11/03/2020   Anxiety    Arthralgia of multiple joints 08/31/2015   Arthritis    osteoarthritis-joints, feet, shoulder   Asthma    previously diagnosed after a bout of bronchitis/ no problems since   Biceps tendinitis 09/10/2019   Bulging of cervical intervertebral disc without myelopathy    C6-C7   Bursitis    b/l hips   Chronic deep vein thrombosis (DVT) of both lower extremities (HCC)    Chronic pain syndrome    Closed fracture of metatarsal bone 08/06/2017   COPD (chronic obstructive pulmonary disease) (HCC) 2004   patient states probably bronchitis   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   Generalized osteoarthritis    GERD (gastroesophageal reflux disease)    H/O nasal septoplasty 09/10/2019   Headache, post-myelogram 09/11/2021   Headache, post-myelogram 09/11/2021   Hiatal hernia    History of lumbar fusion 01/29/2018   History of right hip replacement (12/2017) 01/29/2018   Hyperlipidemia    Increased band cell count 08/31/2015   Infection of superficial incisional surgical site after procedure 06/07/2021   IUD threads lost 04/30/2018   Left leg DVT (HCC)    fem/pop 12/07/20 after right shoulder surgery 11/30/20   Low back pain, chronic    Menopausal flushing 11/30/2020   Mood swings 03/15/2021   Neuropathy    post surgery   Obesity    Open wound 07/04/2021   Open wound of left lower leg 06/07/2021   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 08/17/2019   Peroneal neuropathy, left    Pneumonia    11/2021 [facility] at [facility]   PONV (postoperative nausea and vomiting)    Postoperative nausea and vomiting     Preoperative clearance 09/10/2019   PVD (peripheral vascular disease) (HCC)    Recurrent falls    S/P shoulder replacement, right 12/22/2021   Sinusitis    Special screening for malignant neoplasms, colon    Spondylolisthesis    Strain of foot 01/17/2017   Venous insufficiency of both lower extremities 10/20/2018    Past Surgical History:  Procedure Laterality Date   ABLATION     veins in lower legs   BACK SURGERY  07/2014   lumbar spine/ fusion 1 level   BREAST BIOPSY Left 2011   neg   CARPAL TUNNEL RELEASE Right 12/2015   [facility]   CARPAL TUNNEL RELEASE Left 12/2016   CERVICAL DISC ARTHROPLASTY N/A 09/05/2022   Procedure: CERVICAL SIX-CERVICAL SEVEN ARTIFICIAL DISC REPLACEMENT;  Surgeon: Coletta Memos, MD;  Location: [facility];  Service: Neurosurgery;  Laterality: N/A;  RM 18/3C   COLONOSCOPY WITH PROPOFOL N/A 02/29/2020   Procedure: COLONOSCOPY WITH PROPOFOL;  Surgeon: Midge Minium, MD;  Location: [facility];  Service: Endoscopy;  Laterality: N/A;   ESOPHAGOGASTRODUODENOSCOPY (EGD) WITH PROPOFOL N/A 02/29/2020   Procedure: ESOPHAGOGASTRODUODENOSCOPY (EGD) WITH PROPOFOL;  Surgeon: Midge Minium, MD;  Location: [facility];  Service:  Endoscopy;  Laterality: N/A;   FOOT SURGERY Right 2015   broken navicular bone   HIP ARTHROSCOPY Right 05/2011   HIP SURGERY     left hip surgery 05/2018   HYSTEROSCOPY N/A 11/22/2022   Procedure: HYSTEROSCOPY, IUD REMOVAL;  Surgeon: Christeen Douglas, MD;  Location: [facility] ORS;  Service: Gynecology;  Laterality: N/A;   JOINT REPLACEMENT Bilateral 12/2017 & 05/2018   hip   L5-S1 OLIF Dr. Sharolyn Douglas back surgery 09/2019  09/28/2019   left ganglion cyst removal     12/23/19 Dr. Orland Jarred   MASS EXCISION Left 12/17/2019   Procedure: EXCISION TUMOR FOOT DEEP LEFT;  Surgeon: Recardo Evangelist, DPM;  Location: [facility];  Service: Podiatry;  Laterality: Left;  LMA W/LOCAL   NASAL TURBINATE REDUCTION Bilateral 06/12/2019   Procedure:  TURBINATE REDUCTION/SUBMUCOSAL RESECTION;  Surgeon: Linus Salmons, MD;  Location: [facility];  Service: ENT;  Laterality: Bilateral;   REPLACEMENT TOTAL HIP W/  RESURFACING IMPLANTS Right 12/2017   [facility]   right shoulder surgery     11/28/20 for bone spur, torn labrum, re attached biceps tendon, tear Dr. Ranell Patrick   right shoulder surgery     12/22/21 total shoulder replacement [facility] Dr. Candise Bowens CUFF REPAIR     Dr. Ranell Patrick in 04/2019    SCLEROTHERAPY     veins in lower legs   SEPTOPLASTY Bilateral 06/12/2019   Procedure: SEPTOPLASTY;  Surgeon: Linus Salmons, MD;  Location: [facility];  Service: ENT;  Laterality: Bilateral;   SHOULDER SURGERY Right 12/2015   [facility] torn rotator cuff   SPINAL FUSION  2016   [facility] lumbar   TOTAL SHOULDER ARTHROPLASTY Right 12/22/2021   Procedure: TOTAL SHOULDER ARTHROPLASTY;  Surgeon: Beverely Low, MD;  Location: [facility];  Service: Orthopedics;  Laterality: Right;  with ISB    Family Psychiatric History: I have reviewed family psychiatric history from progress note on 04/30/2018.  Family History:  Family History  Problem Relation Age of Onset   Breast cancer Maternal Aunt    Breast cancer Paternal Aunt    Dementia Paternal Aunt    COPD Mother        quit smoking after 60 years   Arthritis Mother    Osteoarthritis Mother    Dementia Father        symptoms started in his 28s   Alzheimer's disease Father    Rheum arthritis Father    Rheum arthritis Other        FH   Dementia Other        "all my dad's side of the family"   Other Maternal Grandmother        got dementia as she got older    Dementia Paternal Grandmother     Social History: I have reviewed social history from progress note on 04/30/2018. Social History   Socioeconomic History   Marital status: Married    Spouse name: fredrick   Number of children: 1   Years of education: Not on file   Highest education level:  12th grade  Occupational History    Comment: full time  Tobacco Use   Smoking status: Never   Smokeless tobacco: Never  Vaping Use   Vaping status: Never Used  Substance and Sexual Activity   Alcohol use: Not Currently    Alcohol/week: 1.0 standard drink of alcohol    Types: 1 Cans of beer per week    Comment: may have drink 1x/month  Drug use: Never   Sexual activity: Yes  Other Topics Concern   Not on file  Social History Narrative   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    Married 1 child    Lives at home with her husband   Right handed   Caffeine: maybe 1 cup/day (pepsi)   Social Drivers of Health   Financial Resource Strain: Medium Risk (11/04/2022)   Overall Financial Resource Strain (CARDIA)    Difficulty of Paying Living Expenses: Somewhat hard  Food Insecurity: Low Risk  (07/08/2023)   Received from [facility]   Hunger Vital Sign    Worried About Running Out of Food in the Last Year: Never true    Ran Out of Food in the Last Year: Never true  Transportation Needs: No Transportation Needs (07/08/2023)   Received from [facility]    In the past 12 months, has lack of reliable transportation kept you from medical appointments, meetings, work or from getting things needed for daily living? : No  Physical Activity: Unknown (11/04/2022)   Exercise Vital Sign    Days of Exercise per Week: 0 days    Minutes of Exercise per Session: Not on file  Stress: No Stress Concern Present (11/04/2022)   [employer] of Occupational Health - Occupational Stress Questionnaire    Feeling of Stress : Not at all  Social Connections: Unknown (11/04/2022)   Social Connection and Isolation Panel [NHANES]    Frequency of Communication with Friends and Family: More than three times a week    Frequency of Social Gatherings with Friends and Family: Patient declined    Attends Religious Services: Patient declined    Database administrator or Organizations:  No    Attends Engineer, structural: Not on file    Marital Status: Married    Allergies:  Allergies  Allergen Reactions   Amoxicillin Hives   Sulfamethoxazole-Trimethoprim Hives    "Septra",   Rosuvastatin     Myalgia  Other Reaction(s): Myalgias   Sulfamethoxazole Hives   Trimethoprim Hives   Zanaflex [Tizanidine] Other (See Comments)    Weak muscles     Keflex [Cephalexin] Rash         Metabolic Disorder Labs: Lab Results  Component Value Date   HGBA1C 5.7 (H) 04/29/2023   MPG 111.15 12/14/2021   No results found for: "PROLACTIN" Lab Results  Component Value Date   CHOL 218 (H) 11/28/2022   TRIG 106 11/28/2022   HDL 70 11/28/2022   CHOLHDL 2.9 07/10/2022   VLDL 16.1 05/17/2022   LDLCALC 129 (H) 11/28/2022   LDLCALC 96 07/10/2022   Lab Results  Component Value Date   TSH 3.04 11/05/2022   TSH 2.78 05/17/2022    Therapeutic Level Labs: No results found for: "LITHIUM" No results found for: "VALPROATE" No results found for: "CBMZ"  Current Medications: Current Outpatient Medications  Medication Sig Dispense Refill   baclofen (LIORESAL) 10 MG tablet Take 1 tablet (10 mg total) by mouth 3 (three) times daily as needed for muscle spasms. 60 each 1   Buprenorphine HCl (BELBUCA) 450 MCG FILM Place 450 mcg inside cheek in the morning and at bedtime.     cetirizine (ZYRTEC) 10 MG tablet Take 10 mg by mouth daily as needed for allergies.     estradiol (ESTRACE) 0.1 MG/GM vaginal cream Place 1 Applicatorful vaginally once a week.     ezetimibe (ZETIA) 10 MG tablet Take 1 tablet (10 mg  total) by mouth daily. 90 tablet 3   famotidine (PEPCID) 20 MG tablet Take 1 tablet (20 mg total) by mouth at bedtime. 30 tablet 11   fluticasone (FLONASE) 50 MCG/ACT nasal spray SHAKE LIQUID AND USE 1 TO 2 SPRAYS IN EACH NOSTRIL DAILY AS NEEDED. MAX DOSE 2 SPRAYS IN EACH NOSTRIL 16 g 11   hydrOXYzine (ATARAX) 10 MG tablet TAKE 1 TABLET BY MOUTH DAILY AS NEEDED FOR ANXIETY.  90 tablet 1   metFORMIN (GLUCOPHAGE) 500 MG tablet 1 tab daily with full meal 60 tablet 0   naloxone (NARCAN) nasal spray 4 mg/0.1 mL Place 0.4 mg into the nose once.     omeprazole (PRILOSEC) 40 MG capsule TAKE 1 CAPSULE BY MOUTH 2 TIMES A DAY BEFORE MEALS 60 capsule 5   ondansetron (ZOFRAN-ODT) 8 MG disintegrating tablet Take 1 tablet (8 mg total) by mouth every 8 (eight) hours as needed for nausea or vomiting. 30 tablet 0   oxyCODONE-acetaminophen (PERCOCET) 10-325 MG tablet Take 1 tablet by mouth in the morning, at noon, in the evening, and at bedtime.     pregabalin (LYRICA) 50 MG capsule Take 100 mg by mouth 2 (two) times daily.     psyllium (METAMUCIL SMOOTH TEXTURE) 28 % packet Take 1 packet by mouth every other day.     rivaroxaban (XARELTO) 10 MG TABS tablet Take 10 mg by mouth in the morning. (1000)     saccharomyces boulardii (FLORASTOR) 250 MG capsule Take 1 capsule (250 mg total) by mouth daily. 90 capsule 0   sucralfate (CARAFATE) 1 g tablet TAKE 1 TABLET BY MOUTH 4 TIMES A DAY 120 tablet 11   traZODone (DESYREL) 100 MG tablet TAKE TWO TABLETS BY MOUTH EVERY NIGHT AT BEDTIME AS NEEDED FOR SLEEP 180 tablet 2   TURMERIC PO Take 2,000 mg by mouth in the morning. 1000 mg/capsule     valACYclovir (VALTREX) 500 MG tablet Take 500 mg by mouth 2 (two) times daily as needed (outbreaks).     acetaminophen (TYLENOL) 650 MG CR tablet Take 650 mg by mouth every 8 (eight) hours as needed (pain/headaches.). (Patient not taking: Reported on 07/23/2023)     diclofenac Sodium (VOLTAREN) 1 % GEL APPLY 2 GRAMS TO UPPER BODY FOUR TIMES DAILY AS NEEDED AND 4 GRAMS TO LOWER BODY FOUR TIMES DAILY AS NEEDED (Patient not taking: Reported on 07/26/2023) 100 g 11   DULoxetine (CYMBALTA) 60 MG capsule TAKE 1 CAPSULE BY MOUTH DAILY 90 capsule 3   nitrofurantoin (MACRODANTIN) 50 MG capsule Take 50 mg by mouth 4 (four) times daily. (Patient not taking: Reported on 07/26/2023)     predniSONE (DELTASONE) 20 MG tablet  Take 2 tablets (40 mg total) by mouth daily. (Patient not taking: Reported on 07/26/2023) 10 tablet 0   Semaglutide-Weight Management (WEGOVY) 0.25 MG/0.5ML SOAJ Inject 0.25 mg into the skin once a week. (Patient not taking: Reported on 07/26/2023) 2 mL 0   No current facility-administered medications for this visit.     Musculoskeletal: Strength & Muscle Tone:  UTA Gait & Station:  Seated Patient leans: N/A  Psychiatric Specialty Exam: Review of Systems  Psychiatric/Behavioral:  Positive for sleep disturbance. The patient is nervous/anxious.     There were no vitals taken for this visit.There is no height or weight on file to calculate BMI.  General Appearance: Casual  Eye Contact:  Fair  Speech:  Clear and Coherent  Volume:  Normal  Mood:  Anxious  Affect:  Congruent  Thought  Process:  Goal Directed and Descriptions of Associations: Intact  Orientation:  Full (Time, Place, and Person)  Thought Content: Logical   Suicidal Thoughts:  No  Homicidal Thoughts:  No  Memory:  Immediate;   Fair Recent;   Fair Remote;   Fair  Judgement:  Fair  Insight:  Fair  Psychomotor Activity:  Normal  Concentration:  Concentration: Fair and Attention Span: Fair  Recall:  [employer] of Knowledge: Fair  Language: Fair  Akathisia:  No  Handed:  Right  AIMS (if indicated): not done  Assets:  Therapist, sports  ADL's:  Intact  Cognition: WNL  Sleep:  Poor   Screenings: AIMS    Flowsheet Row Video Visit from 12/26/2021 in [facility]  AIMS Total Score 0      GAD-7    Flowsheet Row Office Visit from 02/20/2023 in [facility] Office Visit from 01/07/2023 in [facility] [employer] Visit from 08/16/2022 in [facility] Office Visit from 08/06/2022 in [facility] [employer] Video Visit from 05/14/2022 in [facility]  Total GAD-7 Score 3 2 3 3 1       Mini-Mental    Flowsheet Row Office Visit from 09/23/2019 in [facility]  Total Score (max 30 points ) 27      PHQ2-9    Flowsheet Row Office Visit from 02/20/2023 in [facility] Office Visit from 01/07/2023 in [facility] Visit from 08/16/2022 in [facility] Office Visit from 08/06/2022 in [facility] Video Visit from 05/14/2022 in [facility]  PHQ-2 Total Score 1 1 1 2  0  PHQ-9 Total Score 3 -- 3 4 --      Flowsheet Row Video Visit from 07/26/2023 in [facility] Office Visit from 02/20/2023 in [facility] Admission (Discharged) from 11/22/2022 in [facility]  C-SSRS RISK CATEGORY No Risk No Risk No Risk        Assessment and Plan: ELVETA KESKE is a 58 year old Caucasian female with history of generalized anxiety disorder, insomnia, chronic pain, recent back surgery, currently struggling with anxiety and sleep issues due to her current pain and and recent surgery, discussed assessment and plan as noted below.  Generalized Anxiety Disorder-unstable Increased anxiety pre- and post-surgery. Difficulty finding a therapist who accepts Medicare has exacerbated anxiety. Current medications: Cymbalta 60 mg, hydroxyzine 10 mg PRN, trazodone 200 mg PRN. Prefers not to add new medications due to recent GI issues and current pain management. - Refer to new therapists at the clinic - Send a message to staff to call for scheduling therapy appointment - Continue current medication regimen  Insomnia-unstable due to  pain Insomnia exacerbated by postoperative pain and prednisone use. Current medications: trazodone 200 mg PRN, melatonin 3 mg. Sleep quality significantly impacted by pain and prednisone side effects. - Continue current sleep aids - Address pain management to improve sleep quality  Follow-up - Schedule follow-up appointment in three months on October 31, 2023, at 10:30 AM - Send Cymbalta 60 mg refill to [facility] [address].   Collaboration of Care: Collaboration of Care: Referral or follow-up with counselor/therapist AEB I have communicated with  staff to schedule this patient with therapist.  Patient/Guardian was advised Release of Information must be obtained prior to any record release in order to collaborate their care with an outside provider. Patient/Guardian was advised if they have not already done so to contact the registration department to sign all necessary forms in order for [place] to release information regarding their care.   Consent: Patient/Guardian gives verbal consent for treatment and assignment of benefits for services provided during this visit. Patient/Guardian expressed understanding and agreed to proceed.  This note was generated in part or whole with voice recognition software. Voice recognition is usually quite accurate but there are transcription errors that can and very often do occur. I apologize for any typographical errors that were not detected and corrected.     Jomarie Longs, MD 07/26/2023, 10:56 AM

## 2023-08-14 ENCOUNTER — Other Ambulatory Visit (INDEPENDENT_AMBULATORY_CARE_PROVIDER_SITE_OTHER): Payer: Self-pay | Admitting: Adult Health

## 2023-08-24 ENCOUNTER — Ambulatory Visit
Admission: RE | Admit: 2023-08-24 | Discharge: 2023-08-24 | Disposition: A | Discharge status: Home / Self Care | Payer: Medicare Other | Source: Ambulatory Visit | Attending: Internal Medicine | Admitting: Internal Medicine

## 2023-08-24 ENCOUNTER — Ambulatory Visit
Admission: RE | Admit: 2023-08-24 | Discharge: 2023-08-24 | Disposition: A | Discharge status: Home / Self Care | Payer: Medicare Other | Attending: Physician Assistant | Admitting: Physician Assistant

## 2023-08-24 ENCOUNTER — Telehealth: Payer: Self-pay | Admitting: Emergency Medicine

## 2023-08-24 ENCOUNTER — Ambulatory Visit
Admission: RE | Admit: 2023-08-24 | Discharge: 2023-08-24 | Disposition: A | Discharge status: Home / Self Care | Payer: Medicare Other | Source: Ambulatory Visit | Attending: Physician Assistant | Admitting: Physician Assistant

## 2023-08-24 VITALS — BP 110/80 | HR 90 | Temp 99.1°F | Resp 18

## 2023-08-24 DIAGNOSIS — R0602 Shortness of breath: Secondary | ICD-10-CM

## 2023-08-24 DIAGNOSIS — R059 Cough, unspecified: Secondary | ICD-10-CM | POA: Diagnosis not present

## 2023-08-24 DIAGNOSIS — R509 Fever, unspecified: Secondary | ICD-10-CM

## 2023-08-24 DIAGNOSIS — R051 Acute cough: Secondary | ICD-10-CM | POA: Diagnosis not present

## 2023-08-24 NOTE — Telephone Encounter (Signed)
 Per Sylvia,APP chest xray is negative and patient needs to proceed to the ED for further evaluation.   Called patient, identified using 2 identifiers, and provided this information.  Patient states she does not want to proceed to the ED and asked for a prescription.  This RN explained that we have not found a cause or reason for medication, and that is why Nettie Elm is recommending ED follow up.  Patient verbalized understanding.

## 2023-08-24 NOTE — Discharge Instructions (Signed)
 Go to [facility] to get a chest xray

## 2023-08-24 NOTE — ED Provider Notes (Addendum)
 [facility]    CSN: [id number] Arrival date & time: 08/24/23  1121      History   Chief Complaint Chief Complaint  Patient presents with   Nausea   Shortness of Breath   Cough   Ear Fullness   Generalized Body Aches   Facial Pain    HPI Maureen Randall is a 58 y.o. female who presents with onset of URI 3 weeks ago, but her symptoms got worse one week ago with fatigue, worse nausea provoked with cough, body aches, sinus pressure R ear fullness, and SOB. Cough started 2 days ago. She had spine fusion 2 months ago. Has hx of blood clots and is on xarelto for that. She has started having fever s up to 101 3 days ago, but stayed between 99 and 100 in the past 2 days. Has been chilling a lot.     Past Medical History:  Diagnosis Date   Abnormal glucose    Adenoma of left adrenal gland 07/26/2020   Anterior to posterior tear of superior glenoid labrum of right shoulder 11/03/2020   Anxiety    Arthralgia of multiple joints 08/31/2015   Arthritis    osteoarthritis-joints, feet, shoulder   Asthma    previously diagnosed after a bout of bronchitis/ no problems since   Biceps tendinitis 09/10/2019   Bulging of cervical intervertebral disc without myelopathy    C6-C7   Bursitis    b/l hips   Chronic deep vein thrombosis (DVT) of both lower extremities (HCC)    Chronic pain syndrome    Closed fracture of metatarsal bone 08/06/2017   COPD (chronic obstructive pulmonary disease) (HCC) 2004   patient states probably bronchitis   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   Generalized osteoarthritis    GERD (gastroesophageal reflux disease)    H/O  nasal septoplasty 09/10/2019   Headache, post-myelogram 09/11/2021   Headache, post-myelogram 09/11/2021   Hiatal hernia    History of lumbar fusion 01/29/2018   History of right hip replacement (12/2017) 01/29/2018   Hyperlipidemia    Increased band cell count 08/31/2015   Infection of superficial incisional surgical site after procedure 06/07/2021   IUD threads lost 04/30/2018   Left leg DVT (HCC)    fem/pop 12/07/20 after right shoulder surgery 11/30/20   Low back pain, chronic    Menopausal flushing 11/30/2020   Mood swings 03/15/2021   Neuropathy    post surgery   Obesity    Open wound 07/04/2021   Open wound of left lower leg 06/07/2021   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 08/17/2019   Peroneal neuropathy, left    Pneumonia    11/2021 seen at [facility]   PONV (postoperative nausea and vomiting)    Postoperative nausea and vomiting    Preoperative clearance 09/10/2019   PVD (peripheral vascular disease) (HCC)    Recurrent falls    S/P shoulder replacement, right 12/22/2021   Sinusitis    Special screening for malignant neoplasms, colon    Spondylolisthesis    Strain of foot 01/17/2017   Venous insufficiency of both lower extremities 10/20/2018    Patient Active Problem List   Diagnosis  Date Noted   Urticaria 07/23/2023   Arthritis 02/02/2023   Thyroid disorder screening 08/09/2022   Acute cystitis without hematuria 08/09/2022   Abnormal glucose 08/09/2022   Cervical radiculopathy 03/29/2022   On anticoagulant therapy 03/29/2022   Cervical herniated disc 12/15/2021   DDD (degenerative disc disease), cervical 08/02/2021   Sinusitis 04/03/2021   Vaginal dryness 03/15/2021   Malpositioned IUD, initial encounter 08/04/2020   Recurrent falls 07/26/2020   Peroneal neuropathy, left 07/26/2020   Hyperlipidemia 07/26/2020   Hiatal hernia 06/29/2020   Chronic bilateral low back pain 12/23/2019   Varicose veins of both lower extremities 12/23/2019    Muscle spasm 07/09/2019   Primary osteoarthritis involving multiple joints 07/07/2019   Disorder of left rotator cuff 04/02/2019   Prediabetes 04/01/2019   GAD (generalized anxiety disorder) 02/18/2019   Insomnia due to medical condition 02/18/2019   Gastroesophageal reflux disease without esophagitis 12/25/2018   Recurrent UTI 12/25/2018   Obesity (BMI 30-39.9) 12/25/2018   Primary osteoarthritis of one hip, right 12/20/2017   History of total right hip replacement 12/20/2017   Long term current use of opiate analgesic 08/31/2015   Chronic pain syndrome 08/31/2015   Bilateral carpal tunnel syndrome 08/31/2015   Bilateral lumbar radiculopathy 07/27/2014   Degenerative spondylolisthesis 07/27/2014   Stenosis of lateral recess of lumbosacral spine 07/27/2014   Anxiety state 10/21/2009    Past Surgical History:  Procedure Laterality Date   ABLATION     veins in lower legs   BACK SURGERY  07/2014   lumbar spine/ fusion 1 level   BREAST BIOPSY Left 2011   neg   CARPAL TUNNEL RELEASE Right 12/2015   [facility]   CARPAL TUNNEL RELEASE Left 12/2016   CERVICAL DISC ARTHROPLASTY N/A 09/05/2022   Procedure: CERVICAL SIX-CERVICAL SEVEN ARTIFICIAL DISC REPLACEMENT;  Surgeon: Coletta Memos, MD;  Location: [facility];  Service: Neurosurgery;  Laterality: N/A;  RM 18/3C   COLONOSCOPY WITH PROPOFOL N/A 02/29/2020   Procedure: COLONOSCOPY WITH PROPOFOL;  Surgeon: Midge Minium, MD;  Location: [facility];  Service: Endoscopy;  Laterality: N/A;   ESOPHAGOGASTRODUODENOSCOPY (EGD) WITH PROPOFOL N/A 02/29/2020   Procedure: ESOPHAGOGASTRODUODENOSCOPY (EGD) WITH PROPOFOL;  Surgeon: Midge Minium, MD;  Location: [facility];  Service: Endoscopy;  Laterality: N/A;   FOOT SURGERY Right 2015   broken navicular bone   HIP ARTHROSCOPY Right 05/2011   HIP SURGERY     left hip surgery 05/2018   HYSTEROSCOPY N/A 11/22/2022   Procedure: HYSTEROSCOPY, IUD REMOVAL;  Surgeon: Christeen Douglas, MD;   Location: [facility] ORS;  Service: Gynecology;  Laterality: N/A;   JOINT REPLACEMENT Bilateral 12/2017 & 05/2018   hip   L5-S1 OLIF Dr. Sharolyn Douglas back surgery 09/2019  09/28/2019   left ganglion cyst removal     12/23/19 Dr. Orland Jarred   MASS EXCISION Left 12/17/2019   Procedure: EXCISION TUMOR FOOT DEEP LEFT;  Surgeon: Recardo Evangelist, DPM;  Location: [facility];  Service: Podiatry;  Laterality: Left;  LMA W/LOCAL   NASAL TURBINATE REDUCTION Bilateral 06/12/2019   Procedure: TURBINATE REDUCTION/SUBMUCOSAL RESECTION;  Surgeon: Linus Salmons, MD;  Location: [facility];  Service: ENT;  Laterality: Bilateral;   REPLACEMENT TOTAL HIP W/  RESURFACING IMPLANTS Right 12/2017   [facility]   right shoulder surgery     11/28/20 for bone spur, torn labrum, re attached biceps tendon, tear Dr. Ranell Patrick   right shoulder surgery     12/22/21 total shoulder replacement [facility] Dr. Candise Bowens  CUFF REPAIR     Dr. Ranell Patrick in 04/2019    SCLEROTHERAPY     veins in lower legs   SEPTOPLASTY Bilateral 06/12/2019   Procedure: SEPTOPLASTY;  Surgeon: Linus Salmons, MD;  Location: [facility];  Service: ENT;  Laterality: Bilateral;   SHOULDER SURGERY Right 12/2015   [facility] torn rotator cuff   SPINAL FUSION  2016   [facility] lumbar   TOTAL SHOULDER ARTHROPLASTY Right 12/22/2021   Procedure: TOTAL SHOULDER ARTHROPLASTY;  Surgeon: Beverely Low, MD;  Location: [facility];  Service: Orthopedics;  Laterality: Right;  with ISB    OB History     Gravida  1   Para      Term      Preterm      AB      Living         SAB      IAB      Ectopic      Multiple      Live Births               Home Medications    Prior to Admission medications   Medication Sig Start Date End Date Taking? Authorizing Provider  baclofen (LIORESAL) 10 MG tablet Take 1 tablet (10 mg total) by mouth 3 (three) times daily as needed for muscle spasms. 12/22/21  Yes Beverely Low, MD  cetirizine (ZYRTEC) 10 MG tablet Take 10 mg by mouth daily as needed for allergies.   Yes [provider]  DULoxetine (CYMBALTA) 60 MG capsule TAKE 1 CAPSULE BY MOUTH DAILY 07/26/23  Yes Eappen, Levin Bacon, MD  estradiol (ESTRACE) 0.1 MG/GM vaginal cream Place 1 Applicatorful vaginally once a week. 02/05/17  Yes [provider]  metFORMIN (GLUCOPHAGE) 500 MG tablet 1 tab daily with full meal 06/19/23  Yes Danford, Katy D, NP  ondansetron (ZOFRAN) 8 MG tablet Take by mouth.   Yes [provider]  oxyCODONE-acetaminophen (PERCOCET) 10-325 MG tablet Take 1 tablet by mouth in the morning, at noon, in the evening, and at bedtime. 06/16/19  Yes [provider]  pregabalin (LYRICA) 50 MG capsule Take 100 mg by mouth 2 (two) times daily. 08/01/20  Yes [provider]  psyllium (METAMUCIL SMOOTH TEXTURE) 28 % packet Take 1 packet by mouth every other day.   Yes [provider]  rivaroxaban (XARELTO) 10 MG TABS tablet Take 10 mg by mouth in the morning. (1000)   Yes [provider]  saccharomyces boulardii (FLORASTOR) 250 MG capsule Take 1 capsule (250 mg total) by mouth daily. 08/06/22  Yes Dana Allan, MD  sucralfate (CARAFATE) 1 g tablet TAKE 1 TABLET BY MOUTH 4 TIMES A DAY 12/18/22  Yes Wyline Mood, MD  traZODone (DESYREL) 100 MG tablet TAKE TWO TABLETS BY MOUTH EVERY NIGHT AT BEDTIME AS NEEDED FOR SLEEP 03/29/23  Yes Jomarie Longs, MD  valACYclovir (VALTREX) 500 MG tablet Take 500 mg by mouth 2 (two) times daily as needed (outbreaks).   Yes [provider]  Buprenorphine HCl (BELBUCA) 450 MCG FILM Place 450 mcg inside cheek in the morning and at bedtime.    [provider]  ezetimibe (ZETIA) 10 MG tablet Take 1 tablet (10 mg total) by mouth daily. 07/16/22   Hilty, Lisette Abu, MD  famotidine (PEPCID) 20 MG tablet Take 1 tablet (20 mg total) by mouth at bedtime. 07/23/23   Dana Allan, MD  fluticasone (FLONASE) 50 MCG/ACT  nasal spray SHAKE LIQUID AND USE 1 TO  2 SPRAYS IN EACH NOSTRIL DAILY AS NEEDED. MAX DOSE 2 SPRAYS IN EACH NOSTRIL 09/20/22   Glori Luis, MD  hydrOXYzine (ATARAX) 10 MG tablet TAKE 1 TABLET BY MOUTH DAILY AS NEEDED FOR ANXIETY. 11/13/21   Jomarie Longs, MD  naloxone [facility]) nasal spray 4 mg/0.1 mL Place 0.4 mg into the nose once. 01/08/22   [provider]  omeprazole (PRILOSEC) 40 MG capsule TAKE 1 CAPSULE BY MOUTH 2 TIMES A DAY BEFORE MEALS 01/07/23   Midge Minium, MD  ondansetron (ZOFRAN-ODT) 8 MG disintegrating tablet Take 1 tablet (8 mg total) by mouth every 8 (eight) hours as needed for nausea or vomiting. 04/02/22   Midge Minium, MD  TURMERIC PO Take 2,000 mg by mouth in the morning. 1000 mg/capsule    [provider]    Family History Family History  Problem Relation Age of Onset   Breast cancer Maternal Aunt    Breast cancer Paternal Aunt    Dementia Paternal Aunt    COPD Mother        quit smoking after 60 years   Arthritis Mother    Osteoarthritis Mother    Dementia Father        symptoms started in his 81s   Alzheimer's disease Father    Rheum arthritis Father    Rheum arthritis Other        FH   Dementia Other        "all my dad's side of the family"   Other Maternal Grandmother        got dementia as she got older    Dementia Paternal Grandmother     Social History Social History   Tobacco Use   Smoking status: Never   Smokeless tobacco: Never  Vaping Use   Vaping status: Never Used  Substance Use Topics   Alcohol use: Not Currently    Alcohol/week: 1.0 standard drink of alcohol    Types: 1 Cans of beer per week    Comment: may have drink 1x/month   Drug use: Never     Allergies   Amoxicillin, Sulfamethoxazole-trimethoprim, Rosuvastatin, Sulfamethoxazole, Trimethoprim, Zanaflex [tizanidine], and Keflex [cephalexin]   Review of Systems Review of Systems As noted in HPI  Physical Exam Triage Vital Signs ED Triage Vitals   Encounter Vitals Group     BP 08/24/23 1132 110/80     Systolic BP Percentile --      Diastolic BP Percentile --      Pulse Rate 08/24/23 1132 90     Resp 08/24/23 1132 18     Temp 08/24/23 1132 99.1 F (37.3 C)     Temp Source 08/24/23 1132 Oral     SpO2 08/24/23 1132 93 %     Weight --      Height --      Head Circumference --      Peak Flow --      Pain Score 08/24/23 1129 7     Pain Loc --      Pain Education --      Exclude from Growth Chart --    No data found.  Updated Vital Signs BP 110/80 (BP Location: Left Arm)   Pulse 90   Temp 99.1 F (37.3 C) (Oral)   Resp 18   SpO2 93%   Visual Acuity Right Eye Distance:   Left Eye Distance:   Bilateral Distance:    Right Eye Near:   Left Eye Near:    Bilateral Near:  Physical Exam Physical Exam Vitals signs and nursing note reviewed.  Constitutional:      General: She is not in acute distress.    Appearance: Normal appearance. She is not ill-appearing, toxic-appearing or diaphoretic.  HENT:     Head: Normocephalic.     Right Ear: Tympanic membrane, ear canal and external ear normal.     Left Ear: Tympanic membrane, ear canal and external ear normal.     Nose: Nose normal.     Mouth/Throat:     Mouth: Mucous membranes are moist.  Eyes:     General: No scleral icterus.       Right eye: No discharge.        Left eye: No discharge.     Conjunctiva/sclera: Conjunctivae normal.  Neck:     Musculoskeletal: Neck supple. No neck rigidity.  Cardiovascular:     Rate and Rhythm: Normal rate and regular rhythm.     Heart sounds: No murmur.  Pulmonary:     Effort: Pulmonary effort is normal.     Breath sounds: Normal breath sounds. She is able to speak full sentenses  Musculoskeletal: Normal range of motion.  Lymphadenopathy:     Cervical: No cervical adenopathy.  Skin:    General: Skin is warm and dry.     Coloration: Skin is not jaundiced.     Findings: No rash.  Neurological:     Mental Status: She is  alert and oriented to person, place, and time.     Gait: Gait normal.  Psychiatric:        Mood and Affect: Mood normal.        Behavior: Behavior normal.        Thought Content: Thought content normal.        Judgment: Judgment normal.    UC Treatments / Results  Labs (all labs ordered are listed, but only abnormal results are displayed) Labs Reviewed - No data to display  EKG   Radiology DG Chest 2 View Result Date: 08/24/2023 CLINICAL DATA:  cough, SOB EXAM: CHEST - 2 VIEW COMPARISON:  None Available. FINDINGS: Cardiac silhouette is unremarkable. No pneumothorax or pleural effusion. The lungs are clear. The visualized skeletal structures are unremarkable. IMPRESSION: No acute cardiopulmonary process. Electronically Signed   By: Layla Maw M.D.   On: 08/24/2023 13:00    Procedures Procedures (including critical care time)  Medications Ordered in UC Medications - No data to display  Initial Impression / Assessment and Plan / UC Course  I have reviewed the triage vital signs and the nursing notes. Pertinent  imaging results that were available during my care of the patient were reviewed by me and considered in my medical decision making (see chart for details). SOB She went to [facility] to get a chest xray and was negative. The RN  called her back advising her she needs to go to ER for further work up to find out the cause of her SOB.   Final Clinical Impressions(s) / UC Diagnoses   Final diagnoses:  Acute cough  Fever, unspecified  Shortness of breath     Discharge Instructions      Go to [facility] to get a chest xray     ED Prescriptions   None    PDMP not reviewed this encounter.   Garey Ham, PA-C 08/24/23 1333    Rodriguez-Southworth, [place], PA-C 08/24/23 1723

## 2023-08-24 NOTE — ED Triage Notes (Signed)
 Sx x 3 weeks, started out as runny-stuffy nose. Sx got worse about a week ago Fatigue Nausea Bodyaches Sinus pressure Right ear fullness Cough started 2 days ago. Non productive Sob x 1 week

## 2023-08-28 ENCOUNTER — Encounter (HOSPITAL_BASED_OUTPATIENT_CLINIC_OR_DEPARTMENT_OTHER): Payer: Self-pay | Admitting: Internal Medicine

## 2023-08-28 ENCOUNTER — Ambulatory Visit (INDEPENDENT_AMBULATORY_CARE_PROVIDER_SITE_OTHER): Payer: Medicare Other | Admitting: Adult Health

## 2023-08-28 DIAGNOSIS — E785 Hyperlipidemia, unspecified: Secondary | ICD-10-CM

## 2023-08-29 ENCOUNTER — Encounter (HOSPITAL_BASED_OUTPATIENT_CLINIC_OR_DEPARTMENT_OTHER): Payer: Self-pay

## 2023-09-03 ENCOUNTER — Encounter (HOSPITAL_BASED_OUTPATIENT_CLINIC_OR_DEPARTMENT_OTHER): Payer: Medicare Other | Admitting: Internal Medicine

## 2023-09-23 ENCOUNTER — Ambulatory Visit (INDEPENDENT_AMBULATORY_CARE_PROVIDER_SITE_OTHER): Payer: Medicare Other | Admitting: Adult Health

## 2023-09-23 ENCOUNTER — Encounter (INDEPENDENT_AMBULATORY_CARE_PROVIDER_SITE_OTHER): Payer: Self-pay | Admitting: Adult Health

## 2023-09-23 VITALS — BP 135/84 | HR 85 | Temp 98.1°F | Ht 66.0 in | Wt 194.0 lb

## 2023-09-23 DIAGNOSIS — R7303 Prediabetes: Secondary | ICD-10-CM | POA: Diagnosis not present

## 2023-09-23 DIAGNOSIS — E669 Obesity, unspecified: Secondary | ICD-10-CM

## 2023-09-23 DIAGNOSIS — Z6833 Body mass index (BMI) 33.0-33.9, adult: Secondary | ICD-10-CM

## 2023-09-23 DIAGNOSIS — E538 Deficiency of other specified B group vitamins: Secondary | ICD-10-CM | POA: Diagnosis not present

## 2023-09-23 DIAGNOSIS — Z6831 Body mass index (BMI) 31.0-31.9, adult: Secondary | ICD-10-CM | POA: Diagnosis not present

## 2023-09-23 DIAGNOSIS — Z9889 Other specified postprocedural states: Secondary | ICD-10-CM

## 2023-09-23 MED ORDER — METFORMIN HCL 500 MG PO TABS: ORAL_TABLET | ORAL | 0 refills | Status: DC

## 2023-09-23 NOTE — Progress Notes (Signed)
 WEIGHT SUMMARY AND BIOMETRICS  Vitals Temp: 98.1 F (36.7 C) BP: 135/84 Pulse Rate: 85 SpO2: 98 %   Anthropometric Measurements Height: 5\' 6"  (1.676 m) Weight: 194 lb (88 kg) BMI (Calculated): 31.33 Weight at Last Visit: 191 lb Weight Lost Since Last Visit: 0 lb Weight Gained Since Last Visit: 3 lb Starting Weight: 207 lb Total Weight Loss (lbs): 13 lb (5.897 kg)   Body Composition  Body Fat %: 46.6 % Fat Mass (lbs): 90.8 lbs Muscle Mass (lbs): 98.8 lbs Total Body Water (lbs): 73.2 lbs Visceral Fat Rating : 12   Other Clinical Data Fasting: no Labs: no Today's Visit #: 9 Starting Date: 09/28/22    Chief Complaint:   OBESITY Maureen Randall is here to discuss her progress with her obesity treatment plan.  She is on the the Category 3 Plan and states she is following her eating plan approximately 25 % of the time.  She states she is exercising Walking and ADLs 15 minutes 3 times per week.  Interim History:  07/08/2023 Revision L3-S1 Laminectomy and fusion with TLIF, instrumentation, allograft   She is on daily Xarelto 10mg  She reports current back pain rated 6/10, constant, described as aching with occasional shooting pain.  Hunger/appetite-since her surgery, she has been relying on her husband preparing meals and take out.  Exercise-she recently started standing and walking for at least 15 mins/day  Subjective:   1. H/O laminectomy 07/08/2023 Revision L3-S1 Laminectomy and fusion with TLIF, instrumentation, allograft   Spine Discharge Summary   Admit date: 07/08/2023  Discharge date: 06/20/23  Discharge to: Home  Discharge Service: Orthopedic Spine  Discharge Attending Physician: Virl Diamond, MD  Discharge Diagnosis: Procedure(s): revision L3-S1 Laminectomy and fusion with TLIF, instrumentation, allograft  Secondary Diagnoses: Principal Problem: Spinal stenosis of lumbar region with neurogenic claudication Active Problems: Spinal stenosis,  lumbar region, with neurogenic claudication  OR Procedures: Procedure(s): revision L3-S1 Laminectomy and fusion with TLIF, instrumentation, allograft  Discharge Day Services: The patient was seen and evaluated by the Ortho Spine team on the day of discharge and deemed stable for discharge, including stable labs, vital signs, radiographic studies, and neurologic exam. Discharge instructions were given and explained. Questions were answered.  Physical Examination:  General Appearance: alert, appears stated age, and cooperative Extremities: extremities normal, atraumatic, no cyanosis or edema Pulses: 2+ and symmetric Skin: Skin color, texture, turgor normal. No rashes or lesions Neurologic: Grossly normal  Hospital Course: Patient underwent the above procedure without complications. Patient was transferred to PACU in stable condition and then to [address] [place]. Orthopedic spine protocol was followed. Pain is well controlled with current medications. Will discharge home today after up with PT/OT x1. DME rec per PT/OT.   Condition at Discharge: Improved   2. B12 deficiency  Latest Reference Range & Units 11/05/22 09:08 04/29/23 08:04  Vitamin B12 232 - 1,245 pg/mL 235 309   06/18/2020- Started oral OTC B12 500 mcg once daily tab   3. Prediabetes  Latest Reference Range & Units 04/29/23 08:04  Hemoglobin A1C 4.8 - 5.6 % 5.7 (H)  Est. average glucose Bld gHb Est-mCnc mg/dL 161  INSULIN 2.6 - 09.6 uIU/mL 8.2  (H): Data is abnormally high Lab Results  Component Value Date   HGBA1C 5.7 (H) 04/29/2023   HGBA1C 5.6 11/05/2022   HGBA1C 5.5 12/14/2021     Latest Reference Range & Units 11/28/22 08:44 04/29/23 08:04  INSULIN 2.6 - 24.9 uIU/mL 13.7 8.2   She is on  daily Metformin 500mg - denies GI upset She is pleased to have only gained 3 lbs since [facility] in Dec 2024  Assessment/Plan:   1. H/O laminectomy F/u with Orthopedic Care Team Advance activity as directed as tolerable  2.  B12 deficiency (Primary) Continue oral OTC B12 500 mcg once daily tab   3. Prediabetes Refill  metFORMIN (GLUCOPHAGE) 500 MG tablet 1 tab daily with full meal Dispense: 60 tablet, Refills: 0 ordered   4. Obesity (BMI 30-39.9), Current BMI 31.4  Maureen Randall is not currently in the action stage of change. As such, her goal is to get back to weightloss efforts . She has agreed to the Category 3 Plan.   Exercise goals: No exercise has been prescribed at this time.  Behavioral modification strategies: increasing lean protein intake, decreasing simple carbohydrates, increasing vegetables, increasing water intake, decreasing eating out, no skipping meals, meal planning and cooking strategies, keeping healthy foods in the home, ways to avoid boredom eating, better snacking choices, emotional eating strategies, and planning for success.  Maureen Randall has agreed to follow-up with our clinic in 4 weeks. She was informed of the importance of frequent follow-up visits to maximize her success with intensive lifestyle modifications for her multiple health conditions.   Objective:   Blood pressure 135/84, pulse 85, temperature 98.1 F (36.7 C), height 5\' 6"  (1.676 m), weight 194 lb (88 kg), SpO2 98%. Body mass index is 31.31 kg/m.  General: Cooperative, alert, well developed, in no acute distress. HEENT: Conjunctivae and lids unremarkable. Cardiovascular: Regular rhythm.  Lungs: Normal work of breathing. Neurologic: No focal deficits.   Lab Results  Component Value Date   CREATININE 0.77 11/05/2022   BUN 18 11/05/2022   NA 140 11/05/2022   K 4.1 11/05/2022   CL 103 11/05/2022   CO2 28 11/05/2022   Lab Results  Component Value Date   ALT 17 11/05/2022   AST 19 11/05/2022   ALKPHOS 87 11/05/2022   BILITOT 0.5 11/05/2022   Lab Results  Component Value Date   HGBA1C 5.7 (H) 04/29/2023   HGBA1C 5.6 11/05/2022   HGBA1C 5.5 12/14/2021   HGBA1C 5.5 09/25/2019   HGBA1C 5.7 01/05/2019   Lab Results   Component Value Date   INSULIN 8.2 04/29/2023   INSULIN 13.7 11/28/2022   Lab Results  Component Value Date   TSH 3.04 11/05/2022   Lab Results  Component Value Date   CHOL 218 (H) 11/28/2022   HDL 70 11/28/2022   LDLCALC 129 (H) 11/28/2022   TRIG 106 11/28/2022   CHOLHDL 2.9 07/10/2022   Lab Results  Component Value Date   VD25OH 73.6 04/29/2023   VD25OH 59.4 11/28/2022   VD25OH 48.02 01/05/2019   Lab Results  Component Value Date   WBC 7.2 11/16/2022   HGB 12.5 11/16/2022   HCT 38.3 11/16/2022   MCV 93.2 11/16/2022   PLT 338 11/16/2022   Lab Results  Component Value Date   IRON 77 09/25/2019   TIBC 336 09/25/2019   FERRITIN 37 09/25/2019   Attestation Statements:   Reviewed by clinician on day of visit: allergies, medications, problem list, medical history, surgical history, family history, social history, and previous encounter notes.  I have reviewed the above documentation for accuracy and completeness, and I agree with the above. -  Lonell Stamos d. Toshika Parrow, NP-C

## 2023-09-30 ENCOUNTER — Other Ambulatory Visit

## 2023-10-07 ENCOUNTER — Ambulatory Visit: Payer: Medicare Other

## 2023-10-14 ENCOUNTER — Telehealth: Payer: Self-pay

## 2023-10-14 NOTE — Telephone Encounter (Signed)
 Copied from CRM [id number]. Topic: Appointments - Transfer of Care >> Oct 14, 2023  4:11 PM Aisha D wrote: Pt is requesting to transfer FROM: [facility] at [facility], Dr.Tanya Marlinton Pt is requesting to transfer TO: [facility] at [facility], NP Bethanie Dicker Reason for requested transfer: PCP leaving office It is the responsibility of the team the patient would like to transfer to (Dr. Konrad Dolores) to reach out to the patient if for any reason this transfer is not acceptable.

## 2023-10-15 ENCOUNTER — Other Ambulatory Visit: Payer: Self-pay | Admitting: Psychiatry

## 2023-10-15 DIAGNOSIS — G4700 Insomnia, unspecified: Secondary | ICD-10-CM

## 2023-10-16 ENCOUNTER — Telehealth: Payer: Self-pay | Admitting: Psychiatry

## 2023-10-16 DIAGNOSIS — G4700 Insomnia, unspecified: Secondary | ICD-10-CM

## 2023-10-16 NOTE — Telephone Encounter (Signed)
 Received refill request for trazodone from [facility] pharmacy.  Patient has pending refills for trazodone which was previously sent down to [facility].  Will cancel refills remaining at [facility] before sending new prescription to [facility].

## 2023-10-17 MED ORDER — TRAZODONE HCL 100 MG PO TABS: 200.0000 mg | ORAL_TABLET | Freq: Every evening | ORAL | 3 refills | Status: AC | PRN

## 2023-10-17 NOTE — Telephone Encounter (Signed)
 I have sent trazodone to [facility] pharmacy.

## 2023-10-17 NOTE — Telephone Encounter (Signed)
 called spoke with avery she canceled remaining refills on the trazodone.

## 2023-10-17 NOTE — Telephone Encounter (Signed)
 Pharmacy was notified to cancel remaining refills

## 2023-10-17 NOTE — Addendum Note (Signed)
 Addended byJomarie Longs on: 10/17/2023 05:56 PM   Modules accepted: Orders

## 2023-10-18 NOTE — Telephone Encounter (Signed)
 Pt.notified

## 2023-10-21 ENCOUNTER — Telehealth: Payer: Self-pay | Admitting: Family Medicine

## 2023-10-21 NOTE — Telephone Encounter (Signed)
 Patient need lab orders.

## 2023-10-31 ENCOUNTER — Encounter: Payer: Self-pay | Admitting: Psychiatry

## 2023-10-31 ENCOUNTER — Ambulatory Visit (INDEPENDENT_AMBULATORY_CARE_PROVIDER_SITE_OTHER): Payer: Self-pay | Admitting: Psychiatry

## 2023-10-31 ENCOUNTER — Other Ambulatory Visit (INDEPENDENT_AMBULATORY_CARE_PROVIDER_SITE_OTHER)

## 2023-10-31 VITALS — BP 118/82 | HR 82 | Temp 98.7°F | Ht 66.0 in | Wt 196.6 lb

## 2023-10-31 DIAGNOSIS — F39 Unspecified mood [affective] disorder: Secondary | ICD-10-CM

## 2023-10-31 DIAGNOSIS — G4701 Insomnia due to medical condition: Secondary | ICD-10-CM

## 2023-10-31 DIAGNOSIS — R7303 Prediabetes: Secondary | ICD-10-CM

## 2023-10-31 DIAGNOSIS — F411 Generalized anxiety disorder: Secondary | ICD-10-CM

## 2023-10-31 MED ORDER — TOPIRAMATE 25 MG PO TABS: ORAL_TABLET | ORAL | 0 refills | Status: DC

## 2023-10-31 NOTE — Patient Instructions (Signed)
 [email or web address]  [email or web address]  [email or web address] [facility] [address], [place], [place] [address]   [phone]  [facility] [email or web address] [address], [place], [place] [address]  [phone]  [facility] [email or web address] [address], [place], [place] [address]  [phone]   [facility] - [phone]  [facility] - [phone]  [facility] - [phone] counseling [phone]  [facility] [phone]   [email or web address] this clinician can offer telehealth and has a sliding scale option  [email or web address] this group also offers sliding scale rates and is based out of [place]  Dr. Gara July with the [facility] specializes in divorce  [employer] [facility] has interns who offer sliding scale rates and some of the full time clinicians do, as well. You complete their contact form on their website and the referrals coordinator will help to get connected to someone   [email or web address] [phone]  Medicaid below :  [facility] [address] [place], [place] [address]  [phone]    Estela Held [address] [place], [place] [address]  [phone]    [facility] [address] [place], [place] [address]  [phone]    Topiramate  Tablets What is this medication? TOPIRAMATE  (toe PYRE a mate) prevents and controls seizures in people with epilepsy. It may also be used to prevent migraine headaches. It works by calming overactive nerves in your body. This medicine may be used for other purposes; ask your health care provider or pharmacist if you have questions. COMMON  BRAND NAME(S): Topamax , Topiragen What should I tell my care team before I take this medication? They need to know if you have any of these conditions: Bleeding disorder Kidney disease Lung disease Suicidal thoughts, plans, or attempt by you or a family member An unusual or allergic reaction to topiramate , other medications, foods, dyes, or preservatives Pregnant or trying to get pregnant Breast-feeding How should I use this medication? Take this medication by mouth with water . Take it as directed on the prescription label at the same time every day. Do not cut, crush or chew this medicine. Swallow the tablets whole. You can take it with or without food. If it upsets your stomach, take it with food. Keep taking it unless your care team tells you to stop. A special MedGuide will be given to you by the pharmacist with each prescription and refill. Be sure to read this information carefully each time. Talk to your care team about the use of this medication in children. While it may be prescribed for children as young as 2 years for selected conditions, precautions do apply. Overdosage: If you think you have taken too much of this medicine contact a poison control center or emergency room at once. NOTE: This medicine is only for you. Do not share this medicine with others. What if I miss a dose? If you miss a dose, take it as soon as you can unless it is within 6 hours of the next dose. If it is within 6 hours of the next dose, skip the missed dose. Take the next dose at the normal time. Do not take double or extra doses. What may interact with this medication? Acetazolamide Alcohol Antihistamines for allergy, cough, and cold Aspirin and aspirin-like medications Atropine Certain medications for anxiety  or sleep Certain medications for bladder problems, such as oxybutynin, tolterodine Certain medications for depression, such as amitriptyline, fluoxetine, sertraline Certain medications for  Parkinson disease, such as benztropine, trihexyphenidyl Certain medications for seizures, such as carbamazepine, lamotrigine, phenobarbital, phenytoin, primidone, valproic acid, zonisamide Certain medications for stomach problems, such as dicyclomine, hyoscyamine Certain medications for travel sickness, such as scopolamine  Certain medications that treat or prevent blood clots, such as warfarin, enoxaparin, dalteparin, apixaban, dabigatran, rivaroxaban  Digoxin Diltiazem Estrogen and progestin hormones General anesthetics, such as halothane, isoflurane, methoxyflurane, propofol  Glyburide Hydrochlorothiazide Ipratropium Lithium Medications that relax muscles Metformin  NSAIDs, medications for pain and inflammation, such as ibuprofen  or naproxen Opioid medications for pain Phenothiazines, such as chlorpromazine, mesoridazine, prochlorperazine, thioridazine Pioglitazone This list may not describe all possible interactions. Give your health care provider a list of all the medicines, herbs, non-prescription drugs, or dietary supplements you use. Also tell them if you smoke, drink alcohol, or use illegal drugs. Some items may interact with your medicine. What should I watch for while using this medication? Visit your care team for regular checks on your progress. Tell your care team if your symptoms do not start to get better or if they get worse. Do not suddenly stop taking this medication. You may develop a severe reaction. Your care team will tell you how much medication to take. If your care team wants you to stop the medication, the dose may be slowly lowered over time to avoid any side effects. Wear a medical ID bracelet or chain. Carry a card that describes your condition. List the medications and doses you take on the card. This medication may affect your coordination, reaction time, or judgment. Do not drive or operate machinery until you know how this medication affects you. Sit up or stand  slowly to reduce the risk of dizzy or fainting spells. Drinking alcohol with this medication can increase the risk of these side effects. This medication may cause serious skin reactions. They can happen weeks to months after starting the medication. Contact your care team right away if you notice fevers or flu-like symptoms with a rash. The rash may be red or purple and then turn into blisters or peeling of the skin. You may also notice a red rash with swelling of the face, lips, or lymph nodes in your neck or under your arms. This medication may cause thoughts of suicide or depression. This includes sudden changes in mood, behaviors, or thoughts. These changes can happen at any time but are more common in the beginning of treatment or after a change in dose. Call your care team right away if you experience these thoughts or worsening depression. This medication may slow your child's growth if it is taken for a long time at high doses. Your child's care team will monitor your child's growth. Using this medication for a long time may weaken your bones. The risk of bone fractures may be increased. Talk to your care team about your bone health. Discuss this medication with your care team if you may be pregnant. Serious birth defects can occur if you take this medication during pregnancy. There are benefits and risks to taking medications during pregnancy. Your care team can help you find the option that works for you. Contraception is recommended while taking this medication. Estrogen and progestin hormones may not work as well while you are taking this medication. Your care team can help you find the option that works for you. Talk to your care team before breastfeeding.  Changes to your treatment plan may be needed. What side effects may I notice from receiving this medication? Side effects that you should report to your care team as soon as possible: Allergic reactions--skin rash, itching, hives, swelling  of the face, lips, tongue, or throat High acid level--trouble breathing, unusual weakness or fatigue, confusion, headache, fast or irregular heartbeat, nausea, vomiting High ammonia level--unusual weakness or fatigue, confusion, loss of appetite, nausea, vomiting, seizures Fever that does not go away, decrease in sweat Kidney stones--blood in the urine, pain or trouble passing urine, pain in the lower back or sides Redness, blistering, peeling or loosening of the skin, including inside the mouth Sudden eye pain or change in vision such as blurry vision, seeing halos around lights, vision loss Thoughts of suicide or self-harm, worsening mood, feelings of depression Side effects that usually do not require medical attention (report to your care team if they continue or are bothersome): Burning or tingling sensation in hands or feet Difficulty with paying attention, memory, or speech Dizziness Drowsiness Fatigue Loss of appetite with weight loss Slow or sluggish movements of the body This list may not describe all possible side effects. Call your doctor for medical advice about side effects. You may report side effects to FDA at 1-800-FDA-1088. Where should I keep my medication? Keep out of the reach of children and pets. Store between 15 and 30 degrees C (59 and 86 degrees F). Protect from moisture. Keep the container tightly closed. Get rid of any unused medication after the expiration date. To get rid of medications that are no longer needed or have expired: Take the medication to a medication take-back program. Check with your pharmacy or law enforcement to find a location. If you cannot return the medication, check the label or package insert to see if the medication should be thrown out in the garbage or flushed down the toilet. If you are not sure, ask your care team. If it is safe to put it in the trash, empty the medication out of the container. Mix the medication with cat litter, dirt,  coffee grounds, or other unwanted substance. Seal the mixture in a bag or container. Put it in the trash. NOTE: This sheet is a summary. It may not cover all possible information. If you have questions about this medicine, talk to your doctor, pharmacist, or health care provider.  2024 Elsevier/Gold Standard (2021-11-16 00:00:00)

## 2023-10-31 NOTE — Progress Notes (Signed)
 [facility] MD OP Progress Note  10/31/2023 11:20 AM Maureen Randall  MRN:  [id number]  Chief Complaint:  Chief Complaint  Patient presents with   Follow-up   Anxiety   Medication Refill   Discussed the use of AI scribe software for clinical note transcription with the patient, who gave verbal consent to proceed.  History of Present Illness Maureen Randall is a 58 year old Caucasian female, lives in [place], on disability, has a history of generalized anxiety disorder, chronic pain, insomnia, primary osteoarthritis, radiculopathy, spondylolisthesis, bilateral carpal tunnel syndrome, recent back surgery was evaluated in office today.  She experiences persistent groin pain following recent back surgery, with the pain radiating down her leg. The surgery alleviated some leg pain, but the groin pain persists. Prednisone  provides temporary relief, but the pain returns after stopping the medication. She is currently taking Percocet, buprenorphine , Lyrica  and Cymbalta  for pain management, and has baclofen  available for muscle spasms, which have improved since the surgery.  She has difficulty sleeping due to pain, unable to sleep on her back. She sleeps on her side with pillows for support but often wakes up on her back, exacerbating the pain. Her husband assists by waking her to reposition.  She feels in a 'funk' due to the surgery and ongoing pain, affecting her mood. She tries to walk for exercise, managing 20-30 minutes at a time, but the groin pain intensifies after walking. She is on Cymbalta  for mood management and has tried therapy but is not currently seeing a therapist she connects with.  She is managing her weight and nutrition, having lost over ten pounds through dietary changes learned at a weight management clinic. She gained some weight back post-surgery and is on metformin  for weight management as Wegovy  was too expensive.  Her social history includes living with her husband and assisting her  86 year old mother, who has asthma, emphysema, and COPD. Her mother staying with her due to apartment issues has added stress to her situation.  She currently denies any suicidality, homicidality or perceptual disturbances.  Visit Diagnosis:    ICD-10-CM   1. GAD (generalized anxiety disorder)  F41.1 topiramate  (TOPAMAX ) 25 MG tablet    2. Insomnia due to medical condition  G47.01    Pain    3. Episodic mood disorder (HCC)  F39       Past Psychiatric History: I have reviewed past psychiatric history from progress note on 04/30/2018.  Past Medical History:  Past Medical History:  Diagnosis Date   Abnormal glucose    Adenoma of left adrenal gland 07/26/2020   Anterior to posterior tear of superior glenoid labrum of right shoulder 11/03/2020   Anxiety    Arthralgia of multiple joints 08/31/2015   Arthritis    osteoarthritis-joints, feet, shoulder   Asthma    previously diagnosed after a bout of bronchitis/ no problems since   Biceps tendinitis 09/10/2019   Bulging of cervical intervertebral disc without myelopathy    C6-C7   Bursitis    b/l hips   Chronic deep vein thrombosis (DVT) of both lower extremities (HCC)    Chronic pain syndrome    Closed fracture of metatarsal bone 08/06/2017   COPD (chronic obstructive pulmonary disease) (HCC) 2004   patient states probably bronchitis   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   Generalized osteoarthritis    GERD (gastroesophageal reflux disease)    H/O nasal septoplasty 09/10/2019   Headache, post-myelogram 09/11/2021   Headache, post-myelogram 09/11/2021   Hiatal hernia    History of lumbar fusion  01/29/2018   History of right hip replacement (12/2017) 01/29/2018   Hyperlipidemia    Increased band cell count 08/31/2015   Infection of superficial incisional surgical site after procedure 06/07/2021   IUD threads lost 04/30/2018   Left leg DVT (HCC)    fem/pop 12/07/20 after right shoulder surgery 11/30/20   Low back pain, chronic    Menopausal flushing 11/30/2020   Mood swings 03/15/2021   Neuropathy    post surgery   Obesity    Open wound 07/04/2021   Open wound of left lower leg 06/07/2021   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 08/17/2019   Peroneal neuropathy, left    Pneumonia    11/2021 [facility]   PONV (postoperative nausea and vomiting)    Postoperative nausea and vomiting    Preoperative clearance 09/10/2019   PVD (peripheral vascular disease) (HCC)    Recurrent falls    S/P shoulder replacement, right 12/22/2021   Sinusitis    Special screening for malignant neoplasms, colon    Spondylolisthesis    Strain of foot 01/17/2017   Venous insufficiency of both lower extremities 10/20/2018    Past Surgical History:  Procedure Laterality Date   ABLATION     veins in lower legs   BACK SURGERY  07/2014   lumbar spine/ fusion 1 level   BREAST BIOPSY Left 2011   neg   CARPAL TUNNEL RELEASE Right 12/2015   [facility]   CARPAL TUNNEL RELEASE Left 12/2016   CERVICAL DISC ARTHROPLASTY N/A 09/05/2022   Procedure: CERVICAL SIX-CERVICAL SEVEN ARTIFICIAL DISC REPLACEMENT;  Surgeon: Audie Bleacher, MD;  Location: [facility] OR;  Service: Neurosurgery;  Laterality: N/A;  RM 18/3C   COLONOSCOPY WITH PROPOFOL  N/A 02/29/2020   Procedure: COLONOSCOPY WITH PROPOFOL ;  Surgeon: Marnee Sink, MD;  Location[facility];  Service: Endoscopy;  Laterality: N/A;   ESOPHAGOGASTRODUODENOSCOPY (EGD) WITH PROPOFOL  N/A 02/29/2020   Procedure: ESOPHAGOGASTRODUODENOSCOPY (EGD) WITH PROPOFOL ;  Surgeon: Marnee Sink, MD;  Location: [facility];  Service: Endoscopy;   Laterality: N/A;   FOOT SURGERY Right 2015   broken navicular bone   HIP ARTHROSCOPY Right 05/2011   HIP SURGERY     left hip surgery 05/2018   HYSTEROSCOPY N/A 11/22/2022   Procedure: HYSTEROSCOPY, IUD REMOVAL;  Surgeon: Prescilla Brod, MD;  Location: [facility] ORS;  Service: Gynecology;  Laterality: N/A;   JOINT REPLACEMENT Bilateral 12/2017 & 05/2018   hip   L5-S1 OLIF Dr. Jaquita Merl back surgery 09/2019  09/28/2019   left ganglion cyst removal     12/23/19 Dr. Ulanda Gambles   MASS EXCISION Left 12/17/2019   Procedure: EXCISION TUMOR FOOT DEEP LEFT;  Surgeon: Sharlyn Deaner, DPM;  Location: [facility];  Service: Podiatry;  Laterality: Left;  LMA W/LOCAL   NASAL TURBINATE REDUCTION Bilateral 06/12/2019   Procedure: TURBINATE REDUCTION/SUBMUCOSAL RESECTION;  Surgeon: Lesly Raspberry, MD;  Location: [facility];  Service: ENT;  Laterality: Bilateral;   REPLACEMENT TOTAL HIP W/  RESURFACING IMPLANTS Right 12/2017   [facility]   right shoulder surgery     11/28/20 for bone spur, torn labrum, re attached biceps  tendon, tear Dr. Brunilda Capra   right shoulder surgery     12/22/21 total shoulder replacement [facility] Dr. Brunilda Capra   ROTATOR CUFF REPAIR     Dr. Brunilda Capra in 04/2019    SCLEROTHERAPY     veins in lower legs   SEPTOPLASTY Bilateral 06/12/2019   Procedure: SEPTOPLASTY;  Surgeon: Lesly Raspberry, MD;  Location: [facility];  Service: ENT;  Laterality: Bilateral;   SHOULDER SURGERY Right 12/2015   [facility] torn rotator cuff   SPINAL FUSION  2016   [facility] lumbar   TOTAL SHOULDER ARTHROPLASTY Right 12/22/2021   Procedure: TOTAL SHOULDER ARTHROPLASTY;  Surgeon: Winston Hawking, MD;  Location: [facility];  Service: Orthopedics;  Laterality: Right;  with ISB    Family Psychiatric History: I have reviewed family psychiatric history from progress note on 04/30/2018.  Family History:  Family History  Problem Relation Age of Onset   Breast cancer Maternal Aunt     Breast cancer Paternal Aunt    Dementia Paternal Aunt    COPD Mother        quit smoking after 60 years   Arthritis Mother    Osteoarthritis Mother    Dementia Father        symptoms started in his 31s   Alzheimer's disease Father    Rheum arthritis Father    Rheum arthritis Other        FH   Dementia Other        "all my dad's side of the family"   Other Maternal Grandmother        got dementia as she got older    Dementia Paternal Grandmother     Social History: I have reviewed social history from progress note on 04/30/2018. Social History   Socioeconomic History   Marital status: Married    Spouse name: fredrick   Number of children: 1   Years of education: Not on file   Highest education level: 12th grade  Occupational History    Comment: full time  Tobacco Use   Smoking status: Never   Smokeless tobacco: Never  Vaping Use   Vaping status: Never Used  Substance and Sexual Activity   Alcohol use: Not Currently    Alcohol/week: 1.0 standard drink of alcohol    Types: 1 Cans of beer per week    Comment: may have drink 1x/month   Drug use: Never   Sexual activity: Yes  Other Topics Concern   Not on file  Social History Narrative   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    Married 1 child    Lives at home with her husband   Right handed   Caffeine: maybe 1 cup/day (pepsi)   Social Drivers of Health   Financial Resource Strain: High Risk (09/19/2023)   Received from [facility]   Overall Financial Resource Strain (CARDIA)    Difficulty of Paying Living Expenses: Very hard  Food Insecurity: Food Insecurity Present (09/04/2023)   Received from [facility]   Hunger Vital Sign    Worried About Running Out of Food in the Last Year: Sometimes true    Ran Out of Food in the Last Year: Sometimes true  Transportation Needs: No Transportation Needs (09/19/2023)   [facility] from [facility] -  Transportation    In the past 12 months, has lack of transportation kept you from medical appointments or from getting medications?: No  Lack of Transportation (Non-Medical): No  Physical Activity: Unknown (09/19/2023)   Received from [facility]   Exercise Vital Sign    Days of Exercise per Week: 3 days    Minutes of Exercise per Session: Not on file  Stress: Stress Concern Present (09/19/2023)   Received from [facility] - Occupational Stress Questionnaire    Feeling of Stress : Rather much  Social Connections: Socially Isolated (09/19/2023)   Received from [facility]   Social Connection and Isolation Panel [NHANES]    Frequency of Communication with Friends and Family: More than three times a week    Frequency of Social Gatherings with Friends and Family: Never    Attends Religious Services: Never    Database administrator or Organizations: No    Attends Banker Meetings: Never    Marital Status: Separated    Allergies:  Allergies  Allergen Reactions   Amoxicillin Hives   Sulfamethoxazole-Trimethoprim Hives    "Septra",   Rosuvastatin      Myalgia  Other Reaction(s): Myalgias   Sulfamethoxazole Hives   Trimethoprim Hives   Zanaflex [Tizanidine] Other (See Comments)    Weak muscles     Keflex [Cephalexin] Rash         Metabolic Disorder Labs: Lab Results  Component Value Date   HGBA1C 5.7 (H) 04/29/2023   MPG 111.15 12/14/2021   No results found for: "PROLACTIN" Lab Results  Component Value Date   CHOL 218 (H) 11/28/2022   TRIG 106 11/28/2022   HDL 70 11/28/2022   CHOLHDL 2.9 07/10/2022   VLDL 96.0 05/17/2022   LDLCALC 129 (H) 11/28/2022   LDLCALC 96 07/10/2022   Lab Results  Component Value Date   TSH 3.04 11/05/2022   TSH 2.78 05/17/2022    Therapeutic Level Labs: No results found for: "LITHIUM" No results found for: "VALPROATE" No  results found for: "CBMZ"  Current Medications: Current Outpatient Medications  Medication Sig Dispense Refill   baclofen  (LIORESAL ) 10 MG tablet Take 1 tablet (10 mg total) by mouth 3 (three) times daily as needed for muscle spasms. 60 each 1   Buprenorphine  HCl (BELBUCA ) 450 MCG FILM Place 450 mcg inside cheek in the morning and at bedtime.     cetirizine (ZYRTEC) 10 MG tablet Take 10 mg by mouth daily as needed for allergies.     diclofenac  Sodium (VOLTAREN ) 1 % GEL Apply 2 g topically.     DULoxetine  (CYMBALTA ) 60 MG capsule TAKE 1 CAPSULE BY MOUTH DAILY 90 capsule 3   estradiol  (ESTRACE ) 0.1 MG/GM vaginal cream Place 1 Applicatorful vaginally once a week.     ezetimibe  (ZETIA ) 10 MG tablet Take 1 tablet (10 mg total) by mouth daily. 90 tablet 3   famotidine  (PEPCID ) 20 MG tablet Take 1 tablet (20 mg total) by mouth at bedtime. 30 tablet 11   fluticasone  (FLONASE ) 50 MCG/ACT nasal spray SHAKE LIQUID AND USE 1 TO 2 SPRAYS IN EACH NOSTRIL DAILY AS NEEDED. MAX DOSE 2 SPRAYS IN EACH NOSTRIL 16 g 11   hydrOXYzine  (ATARAX ) 10 MG tablet TAKE 1 TABLET BY MOUTH DAILY AS NEEDED FOR ANXIETY. 90 tablet 1   metFORMIN  (GLUCOPHAGE ) 500 MG tablet 1 tab daily with full meal 60 tablet 0   naloxone (NARCAN) nasal spray 4 mg/0.1 mL Place 0.4 mg into the nose once.     omeprazole  (PRILOSEC) 40 MG capsule TAKE 1 CAPSULE BY MOUTH  2 TIMES A DAY BEFORE MEALS 60 capsule 5   ondansetron  (ZOFRAN ) 8 MG tablet Take by mouth.     ondansetron  (ZOFRAN -ODT) 4 MG disintegrating tablet Take 2 mg by mouth every 6 (six) hours as needed.     oxyCODONE -acetaminophen  (PERCOCET) 10-325 MG tablet Take 1 tablet by mouth in the morning, at noon, in the evening, and at bedtime.     pregabalin  (LYRICA ) 100 MG capsule Take 100 mg by mouth 3 (three) times daily.     psyllium (METAMUCIL SMOOTH TEXTURE) 28 % packet Take 1 packet by mouth every other day.     rivaroxaban  (XARELTO ) 10 MG TABS tablet Take 10 mg by mouth in the morning. (1000)      saccharomyces boulardii (FLORASTOR) 250 MG capsule Take 1 capsule (250 mg total) by mouth daily. 90 capsule 0   sucralfate  (CARAFATE ) 1 g tablet TAKE 1 TABLET BY MOUTH 4 TIMES A DAY 120 tablet 11   topiramate  (TOPAMAX ) 25 MG tablet Take 1 tablet (25 mg total) by mouth daily for 15 days, THEN 1 tablet (25 mg total) 2 (two) times daily for 15 days. 45 tablet 0   traZODone  (DESYREL ) 100 MG tablet Take 2 tablets (200 mg total) by mouth at bedtime as needed for sleep. 180 tablet 3   TURMERIC PO Take 2,000 mg by mouth in the morning. 1000 mg/capsule     valACYclovir  (VALTREX ) 500 MG tablet Take 500 mg by mouth 2 (two) times daily as needed (outbreaks).     No current facility-administered medications for this visit.     Musculoskeletal: Strength & Muscle Tone: within normal limits Gait & Station: normal Patient leans: N/A  Psychiatric Specialty Exam: Review of Systems  Psychiatric/Behavioral:  Positive for sleep disturbance. The patient is nervous/anxious.        Irritable, mood swings    Blood pressure 118/82, pulse 82, temperature 98.7 F (37.1 C), temperature source Temporal, height 5\' 6"  (1.676 m), weight 196 lb 9.6 oz (89.2 kg), SpO2 92%.Body mass index is 31.73 kg/m.  General Appearance: Casual  Eye Contact:  Good  Speech:  Clear and Coherent  Volume:  Normal  Mood:  Anxious, irritable, mood swings  Affect:  Congruent  Thought Process:  Goal Directed and Descriptions of Associations: Intact  Orientation:  Full (Time, Place, and Person)  Thought Content: Logical   Suicidal Thoughts:  No  Homicidal Thoughts:  No  Memory:  Immediate;   Fair Recent;   Fair Remote;   Fair  Judgement:  Fair  Insight:  Fair  Psychomotor Activity:  Normal  Concentration:  Concentration: Fair and Attention Span: Fair  Recall:  [employer] of Knowledge: Fair  Language: Fair  Akathisia:  No  Handed:  Right  AIMS (if indicated): not done  Assets:  Desire for Improvement Housing Social  Support Transportation  ADL's:  Intact  Cognition: WNL  Sleep:   restless due to pain   Screenings: AIMS    Flowsheet Row Video Visit from 12/26/2021 in [facility]  AIMS Total Score 0      GAD-7    Flowsheet Row Office Visit from 10/31/2023 in [facility] Office Visit from 02/20/2023 in [facility] Office Visit from 01[facility]07/2023 in [facility] [employer] Visit from 08/16/2022 in [facility] Office Visit from 08/06/2022 in [facility]  Total GAD-7 Score  3 3 2 3 3       Mini-Mental    Flowsheet Row Office Visit from 09/23/2019 in [facility]  Total Score (max 30 points ) 27      PHQ2-9    Flowsheet Row Office Visit from 10/31/2023 in [facility] Office Visit from 02/20/2023 in [facility] Office Visit from 01/07/2023 in [facility] Visit from 08/16/2022 in [facility] Office Visit from 08/06/2022 in [facility] Total Score 1 1 1 1 2   PHQ-9 Total Score -- 3 -- 3 4      Flowsheet Row Office Visit from 10/31/2023 in [facility] ED from 08/24/2023 in [facility]  Video Visit from 07/26/2023 in [facility]  C-SSRS RISK CATEGORY No Risk No Risk No Risk        Assessment and Plan: TYRISHA BENNINGER is a 58 year old Caucasian female with history of generalized anxiety disorder, insomnia, chronic pain, continues to struggle with mood, irritability anxiety and sleep mostly due to pain, discussed assessment and plan as  noted below.  Assessment & Plan Generalized anxiety disorder-unstable Episodic mood disorder-unstable Mood swings exacerbated by post-surgical recovery challenges and persistent pain. She reports feeling in a 'funk' due to ongoing pain and recovery difficulties. Topiramate  considered for mood stabilization and potential weight loss benefits. Discussed potential side effects including memory issues, kidney stones, and appetite suppression. Emphasized importance of therapy over medication for mood management. - Start Topiramate  25 mg daily in the evening for 15 days, then increase to 25 mg twice daily if tolerated. - Monitor for side effects such as drowsiness, memory issues, and appetite suppression. - Continue Cymbalta  60 mg daily - Continue Hydroxyzine  10 mg daily as needed - Provide list of local therapists and information on [facility] for affordable therapy options.  Sleep disturbance due to pain-unstable Sleep disturbance primarily due to pain from post-surgical recovery. Difficulty sleeping on back due to pain, requiring specific positioning with pillows. - Continue Trazodone  200 mg at bedtime as needed - Continue Melatonin 3 mg at bedtime as needed - Will benefit from continued pain management.  Follow-up - Follow-up in clinic in 3 to 4 weeks or sooner if needed.   Collaboration of Care: Collaboration of Care: Referral or follow-up with counselor/therapist AEB patient encouraged to establish care with therapist, provided resources.  Patient/Guardian was advised Release of Information must be obtained prior to any record release in order to collaborate their care with an outside provider. Patient/Guardian was advised if they have not already done so to contact the registration department to sign all necessary forms in order for us  to release information regarding their care.   Consent: Patient/Guardian gives verbal consent for treatment and assignment of benefits for  services provided during this visit. Patient/Guardian expressed understanding and agreed to proceed.  This note was generated in part or whole with voice recognition software. Voice recognition is usually quite accurate but there are transcription errors that can and very often do occur. I apologize for any typographical errors that were not detected and corrected.     Gennie Dib, MD 11/01/2023, 7:36 AM

## 2023-11-01 LAB — COMPREHENSIVE METABOLIC PANEL WITH GFR
ALT: 19 IU/L (ref 0–32)
AST: 19 IU/L (ref 0–40)
Albumin: 4.1 g/dL (ref 3.8–4.9)
Alkaline Phosphatase: 80 IU/L (ref 44–121)
BUN/Creatinine Ratio: 23 (ref 9–23)
BUN: 18 mg/dL (ref 6–24)
Bilirubin Total: 0.3 mg/dL (ref 0.0–1.2)
CO2: 25 mmol/L (ref 20–29)
Calcium: 9.4 mg/dL (ref 8.7–10.2)
Chloride: 105 mmol/L (ref 96–106)
Creatinine, Ser: 0.77 mg/dL (ref 0.57–1.00)
Globulin, Total: 2.1 g/dL (ref 1.5–4.5)
Glucose: 85 mg/dL (ref 70–99)
Potassium: 4.2 mmol/L (ref 3.5–5.2)
Sodium: 143 mmol/L (ref 134–144)
Total Protein: 6.2 g/dL (ref 6.0–8.5)
eGFR: 89 mL/min/{1.73_m2} (ref >59)

## 2023-11-05 NOTE — Progress Notes (Signed)
 Cardiology Office Note:  .   Date:  11/06/2023  ID:  Maureen Randall, DOB 10/23/1965, MRN [id number] PCP: Bluford Burkitt, NP  [facility] Providers Cardiologist:  Hazle Lites, MD    Patient Profile: .      PMH Dyslipidemia DVT on chronic anticoagulation Lumbar fusion Coronary calcium  score of 0 on CT 04/2021  Referred to Advanced Lipid Disorder clinic and seen by Dr. Maximo Spar 04/04/2021.  At that time she had few cardiovascular risk factors being that she was not diabetic, did not have hypertension, and no family history of early onset heart disease.  Lipids at that time were total cholesterol 226, triglycerides 113, HDL 68, and LDL 135.  She admitted to less than I will deal diet including lots of cheeseburgers, cheese, and other sources of saturated fat. Also weight was not ideal for height. CT calcium  score 04/2021 was 0.  LDL increased to 150 at next office visit and through shared decision making, she agreed to start statin therapy with rosuvastatin  5 mg daily for goal LDL less than 100.  Unfortunately, she had myalgia with rosuvastatin  and discontinued it. She started ezetimibe  10 mg daily with improvement in lipids of total cholesterol 176, triglycerides 106, HDL 61 and LDL 96.        History of Present Illness: .    History of Present Illness Maureen Randall is a very pleasant 58 year old female who present today for follow-up of hyperlipidemia. She thought that she had blood work last week to test cholesterol, but it was not completed. She is not currently taking Zetia  due to running out of the medication. She had back surgery December 2024 which caused her not to be able to come in for appointment earlier this year. Most recent LDL was 129 May 2024 while on Zetia . She is working on significant lifestyle changes, including weight loss and dietary modifications, such as reducing soda intake, increasing consumption of protein, and following a meal plan through [employer] [facility] program. Just started Wegovy  injections and is aiming to walk 20-30 minutes about four times a week, though her activity is limited by back pain. Her past medical history includes deep vein thrombosis, for which she is on lifelong Xarelto , she asks about clotting disorders. from L4 to S1. Reports BP has always been well controlled.  She denies chest pain, shortness of breath, orthopnea, PND, edema, presyncope, syncope, palpitations.  Discussed the use of AI scribe software for clinical note transcription with the patient, who gave verbal consent to proceed.   ROS: + chronic back/hip pain       Studies Reviewed: Aaron Aas        No results found for: "LIPOA"  Risk Assessment/Calculations:             Physical Exam:   VS:  BP 112/86 (BP Location: Left Arm, Patient Position: Sitting, Cuff Size: Normal)   Pulse 64   Ht 5\' 7"  (1.702 m)   Wt 195 lb 9.6 oz (88.7 kg)   SpO2 99%   BMI 30.64 kg/m    Wt Readings from Last 3 Encounters:  11/06/23 195 lb 9.6 oz (88.7 kg)  09/23/23 194 lb (88 kg)  07/23/23 191 lb (86.6 kg)    GEN: Well nourished, well developed in no acute distress NECK: No JVD; No carotid bruits CARDIAC: RRR, no murmurs, rubs, gallops RESPIRATORY:  Clear to auscultation without rales, wheezing or rhonchi  ABDOMEN: Soft, non-tender, non-distended EXTREMITIES:  No edema; No deformity     ASSESSMENT AND PLAN: .    Assessment & Plan Hyperlipidemia/Statin Myalgia LDL goal < 100 due to no history of CAD, calcium  score of 0 on CT 04/2021. Most recent lipid panel 11/28/2022 with total cholesterol 218, triglycerides 106, LDL C129, and HDL 70.  We discussed checking lipoprotein a for further risk stratification; if elevated goal LDL would be < 70.  She thought lipids were checked with labs last week, however there is no recent lipid profile to review.  We will get updated lipid panel as well as lipoprotein a today.  She has been off Zetia  for a couple of months but reports  improved lifestyle modification including more exercise and healthier diet. Will get lab results prior to providing Zetia  refill. She is open to consider alternative lipid lowering medications but does not want to retry statin due to significant myalgia in the past.    History of DVT on Chronic Anticoagulation Two prior DVTs for which she has been advised to remain on lifelong anticoagulation.  She reports seeing a hematologist in the past, but is unsure as to whether she has a clotting disorder. No acute concerns today.  Management per PCP.   Obesity   She recently started Wegovy  and has been working on significant lifestyle modification through [employer] [facility]. Continue healthy weight loss including regular moderate intensity exercise along with weight lifting and resistance training for muscle strengthening. History of significant back and hip problems. PT is too expensive for her. We are referring her to PREP at [facility] to see if this is an option for regular exercise.   Chronic back pain with post-surgical complications   Following an L3-L4 fusion, she experiences new groin pain and worsening back pain. Physical therapy has not started due to cost. Refer to the [facility] physician referral program for an exercise program. Encourage weightlifting or resistance band exercises at home.        Disposition:6 months with me  Signed, Slater Duncan, NP-C

## 2023-11-06 ENCOUNTER — Ambulatory Visit (INDEPENDENT_AMBULATORY_CARE_PROVIDER_SITE_OTHER): Admitting: Nurse Practitioner

## 2023-11-06 ENCOUNTER — Encounter (HOSPITAL_BASED_OUTPATIENT_CLINIC_OR_DEPARTMENT_OTHER): Payer: Self-pay | Admitting: Nurse Practitioner

## 2023-11-06 VITALS — BP 112/86 | HR 64 | Ht 67.0 in | Wt 195.6 lb

## 2023-11-06 DIAGNOSIS — M791 Myalgia, unspecified site: Secondary | ICD-10-CM | POA: Diagnosis not present

## 2023-11-06 DIAGNOSIS — Z86718 Personal history of other venous thrombosis and embolism: Secondary | ICD-10-CM | POA: Diagnosis not present

## 2023-11-06 DIAGNOSIS — E669 Obesity, unspecified: Secondary | ICD-10-CM

## 2023-11-06 DIAGNOSIS — Z7901 Long term (current) use of anticoagulants: Secondary | ICD-10-CM

## 2023-11-06 DIAGNOSIS — M545 Low back pain, unspecified: Secondary | ICD-10-CM

## 2023-11-06 DIAGNOSIS — E785 Hyperlipidemia, unspecified: Secondary | ICD-10-CM

## 2023-11-06 DIAGNOSIS — G8929 Other chronic pain: Secondary | ICD-10-CM

## 2023-11-06 DIAGNOSIS — T466X5D Adverse effect of antihyperlipidemic and antiarteriosclerotic drugs, subsequent encounter: Secondary | ICD-10-CM

## 2023-11-06 NOTE — Patient Instructions (Signed)
 Medication Instructions:   Your physician recommends that you continue on your current medications as directed. Please refer to the Current Medication list given to you today.   *If you need a refill on your cardiac medications before your next appointment, please call your pharmacy*  Lab Work:  TODAY!!! LIPID/LPA  If you have labs (blood work) drawn today and your tests are completely normal, you will receive your results only by: MyChart Message (if you have MyChart) OR A paper copy in the mail If you have any lab test that is abnormal or we need to change your treatment, we will call you to review the results.  Testing/Procedures:  None ordered.  Follow-Up: At [facility], you and your health needs are our priority.  As part of our continuing mission to provide you with exceptional heart care, our providers are all part of one team.  This team includes your primary Cardiologist (physician) and Advanced Practice Providers or APPs (Physician Assistants and Nurse Practitioners) who all work together to provide you with the care you need, when you need it.  Your next appointment:   6 month(s)  Provider:   Slater Duncan, NP    We recommend signing up for the patient portal called "MyChart".  Sign up information is provided on this After Visit Summary.  MyChart is used to connect with patients for Virtual Visits (Telemedicine).  Patients are able to view lab/test results, encounter notes, upcoming appointments, etc.  Non-urgent messages can be sent to your provider as well.   To learn more about what you can do with MyChart, go to [email or web address].   Other Instructions  Your physician wants you to follow-up in: 6 months.  You will receive a reminder letter in the mail two months in advance. If you don't receive a letter, please call our office to schedule the follow-up appointment.  You have been referred to [facility] program.  Someone should call you about this  information.

## 2023-11-07 ENCOUNTER — Encounter (INDEPENDENT_AMBULATORY_CARE_PROVIDER_SITE_OTHER): Payer: Self-pay | Admitting: Adult Health

## 2023-11-07 ENCOUNTER — Ambulatory Visit (INDEPENDENT_AMBULATORY_CARE_PROVIDER_SITE_OTHER): Admitting: Adult Health

## 2023-11-07 VITALS — BP 112/78 | HR 71 | Temp 98.5°F | Ht 66.0 in | Wt 190.0 lb

## 2023-11-07 DIAGNOSIS — E669 Obesity, unspecified: Secondary | ICD-10-CM

## 2023-11-07 DIAGNOSIS — E559 Vitamin D deficiency, unspecified: Secondary | ICD-10-CM | POA: Diagnosis not present

## 2023-11-07 DIAGNOSIS — R7303 Prediabetes: Secondary | ICD-10-CM

## 2023-11-07 DIAGNOSIS — K219 Gastro-esophageal reflux disease without esophagitis: Secondary | ICD-10-CM

## 2023-11-07 DIAGNOSIS — E538 Deficiency of other specified B group vitamins: Secondary | ICD-10-CM | POA: Diagnosis not present

## 2023-11-07 DIAGNOSIS — Z6830 Body mass index (BMI) 30.0-30.9, adult: Secondary | ICD-10-CM

## 2023-11-07 DIAGNOSIS — Z6833 Body mass index (BMI) 33.0-33.9, adult: Secondary | ICD-10-CM

## 2023-11-07 LAB — LIPOPROTEIN A (LPA): Lipoprotein (a): 64.9 nmol/L (ref <75.0)

## 2023-11-07 LAB — LIPID PANEL
Chol/HDL Ratio: 3 ratio (ref 0.0–4.4)
Cholesterol, Total: 243 mg/dL — ABNORMAL HIGH (ref 100–199)
HDL: 82 mg/dL (ref >39)
LDL Chol Calc (NIH): 147 mg/dL — ABNORMAL HIGH (ref 0–99)
Triglycerides: 83 mg/dL (ref 0–149)
VLDL Cholesterol Cal: 14 mg/dL (ref 5–40)

## 2023-11-07 MED ORDER — METFORMIN HCL 500 MG PO TABS: ORAL_TABLET | ORAL | 0 refills | Status: DC

## 2023-11-07 NOTE — Progress Notes (Addendum)
 WEIGHT SUMMARY AND BIOMETRICS  Vitals Temp: 98.5 F (36.9 C) BP: 112/78 Pulse Rate: 71 SpO2: 95 %   Anthropometric Measurements Height: 5\' 6"  (1.676 m) Weight: 190 lb (86.2 kg) BMI (Calculated): 30.68 Weight at Last Visit: 194 lb Weight Lost Since Last Visit: 4 lb Weight Gained Since Last Visit: 0 Starting Weight: 207 lb Total Weight Loss (lbs): 17 lb (7.711 kg)   Body Composition  Body Fat %: 44.4 % Fat Mass (lbs): 84.4 lbs Muscle Mass (lbs): 100.2 lbs Total Body Water  (lbs): 71.2 lbs Visceral Fat Rating : 11   Other Clinical Data Fasting: yes Labs: no Today's Visit #: 10 Starting Date: 09/28/22    Chief Complaint:   OBESITY Maureen Randall is here to discuss her progress with her obesity treatment plan.  She is on the the Category 3 Plan and states she is following her eating plan approximately 50 % of the time.  She states she is exercising Walking 20-30 minutes 4 times per week.  Interim History:  Initially provided Wegovy  Rx August 2024 by Dr, Pink Bridges She did not start due to cost.  She recently used a manufacturer coupon and was able to afford Wegovy . She started Wegovy  0.25mg  on/about 11/04/2023  She experienced increased GERD sx's with one episode of N/V the evening of 11/04/2023  She continues to experience back pain with radiation to LLE  Subjective:   1. B12 deficiency  Latest Reference Range & Units 11/05/22 09:08 04/29/23 08:04  Vitamin B12 232 - 1,245 pg/mL 235 309    B12 Low normal She is on daily Metformin  500mg   2. Prediabetes Lab Results  Component Value Date   HGBA1C 5.7 (H) 04/29/2023   HGBA1C 5.6 11/05/2022   HGBA1C 5.5 12/14/2021    She recently used a manufacturer coupon and was able to afford Wegovy . She started Wegovy  0.25mg  on/about 11/04/2023  She experienced increased GERD sx's with one episode of N/V the evening of 11/04/2023 Denies mass in neck, dysphagia,persistent hoarseness, abdominal pain. She has remained on daily  Metformin  500mg   3. Vitamin D  deficiency  Latest Reference Range & Units 11/28/22 08:44 04/29/23 08:04  Vitamin D , 25-Hydroxy 30.0 - 100.0 ng/mL 59.4 73.6   Vit D Level is stable and at goal  Assessment/Plan:   1. B12 deficiency (Primary) Increase B12 Rich Foods  2. Prediabetes Continue Cat 3 MP and be as active as tolerated Continue weekly Wegovy  therapy  3. Vitamin D  deficiency Check Labs  4. Obesity (BMI 30-39.9), Current BMI 30.7 Continue weekly Wegovy  0.25mg - she has 3 injections Consider titration at next OV  Maureen Randall is currently in the action stage of change. As such, her goal is to continue with weight loss efforts. She has agreed to the Category 3 Plan.   Exercise goals: All adults should avoid inactivity. Some physical activity is better than none, and adults who participate in any amount of physical activity gain some health benefits. Adults should also include muscle-strengthening activities that involve all major muscle groups on 2 or more days a week.  Behavioral modification strategies: increasing lean protein intake, decreasing simple carbohydrates, increasing vegetables, increasing water  intake, no skipping meals, meal planning and cooking strategies, keeping healthy foods in the home, ways to avoid boredom eating, and planning for success.  Maureen Randall has agreed to follow-up with our clinic in 3 weeks. She was informed of the importance of frequent follow-up visits to maximize her success with intensive lifestyle modifications for her multiple health conditions.   F/u  with Dr. Pink Bridges in 3 weeks  Objective:   Blood pressure 112/78, pulse 71, temperature 98.5 F (36.9 C), height 5\' 6"  (1.676 m), weight 190 lb (86.2 kg), SpO2 95%. Body mass index is 30.67 kg/m.  General: Cooperative, alert, well developed, in no acute distress. HEENT: Conjunctivae and lids unremarkable. Cardiovascular: Regular rhythm.  Lungs: Normal work of breathing. Neurologic: No focal  deficits.   Lab Results  Component Value Date   CREATININE 0.77 10/31/2023   BUN 18 10/31/2023   NA 143 10/31/2023   K 4.2 10/31/2023   CL 105 10/31/2023   CO2 25 10/31/2023   Lab Results  Component Value Date   ALT 19 10/31/2023   AST 19 10/31/2023   ALKPHOS 80 10/31/2023   BILITOT 0.3 10/31/2023   Lab Results  Component Value Date   HGBA1C 5.7 (H) 04/29/2023   HGBA1C 5.6 11/05/2022   HGBA1C 5.5 12/14/2021   HGBA1C 5.5 09/25/2019   HGBA1C 5.7 01/05/2019   Lab Results  Component Value Date   INSULIN  8.2 04/29/2023   INSULIN  13.7 11/28/2022   Lab Results  Component Value Date   TSH 3.04 11/05/2022   Lab Results  Component Value Date   CHOL 218 (H) 11/28/2022   HDL 70 11/28/2022   LDLCALC 129 (H) 11/28/2022   TRIG 106 11/28/2022   CHOLHDL 2.9 07/10/2022   Lab Results  Component Value Date   VD25OH 73.6 04/29/2023   VD25OH 59.4 11/28/2022   VD25OH 48.02 01/05/2019   Lab Results  Component Value Date   WBC 7.2 11/16/2022   HGB 12.5 11/16/2022   HCT 38.3 11/16/2022   MCV 93.2 11/16/2022   PLT 338 11/16/2022   Lab Results  Component Value Date   IRON 77 09/25/2019   TIBC 336 09/25/2019   FERRITIN 37 09/25/2019    Attestation Statements:   Reviewed by clinician on day of visit: allergies, medications, problem list, medical history, surgical history, family history, social history, and previous encounter notes.  Time spent on visit including pre-visit chart review and post-visit care and charting was 28 minutes.   I have reviewed the above documentation for accuracy and completeness, and I agree with the above. -  Bobbijo Holst d. Jamaree Hosier, NP-C

## 2023-11-08 ENCOUNTER — Other Ambulatory Visit (HOSPITAL_BASED_OUTPATIENT_CLINIC_OR_DEPARTMENT_OTHER): Payer: Self-pay | Admitting: *Deleted

## 2023-11-08 ENCOUNTER — Encounter (HOSPITAL_BASED_OUTPATIENT_CLINIC_OR_DEPARTMENT_OTHER): Payer: Self-pay

## 2023-11-08 DIAGNOSIS — E785 Hyperlipidemia, unspecified: Secondary | ICD-10-CM

## 2023-11-08 MED ORDER — EZETIMIBE 10 MG PO TABS: 10.0000 mg | ORAL_TABLET | Freq: Every day | ORAL | 3 refills | Status: DC

## 2023-11-11 ENCOUNTER — Encounter: Payer: Self-pay | Admitting: Nurse Practitioner

## 2023-11-11 MED ORDER — OMEPRAZOLE 40 MG PO CPDR: 40.0000 mg | DELAYED_RELEASE_CAPSULE | Freq: Two times a day (BID) | ORAL | 3 refills | Status: AC

## 2023-11-11 MED ORDER — OMEPRAZOLE 40 MG PO CPDR: 40.0000 mg | DELAYED_RELEASE_CAPSULE | Freq: Two times a day (BID) | ORAL | 3 refills | Status: DC

## 2023-11-11 NOTE — Addendum Note (Signed)
 Addended by: Lovie Rudder on: 11/11/2023 02:58 PM   Modules accepted: Orders

## 2023-11-11 NOTE — Addendum Note (Signed)
 Addended by: Lovie Rudder on: 11/11/2023 02:16 PM   Modules accepted: Orders

## 2023-11-12 MED ORDER — ONDANSETRON 4 MG PO TBDP: 2.0000 mg | ORAL_TABLET | Freq: Four times a day (QID) | ORAL | 0 refills | Status: AC | PRN

## 2023-11-13 ENCOUNTER — Telehealth: Payer: Self-pay

## 2023-11-13 NOTE — Telephone Encounter (Signed)
 Called re: PREP program referral, explained PREP, she would like to attend, but wants to think about it, she lives in [place] she would commute to [place]; offered her the next class on May 27, every T/Th at 10am; she will call me back with her decision.

## 2023-11-25 ENCOUNTER — Other Ambulatory Visit: Payer: Self-pay | Admitting: Obstetrics and Gynecology

## 2023-11-25 DIAGNOSIS — Z1231 Encounter for screening mammogram for malignant neoplasm of breast: Secondary | ICD-10-CM

## 2023-11-26 ENCOUNTER — Telehealth: Payer: Self-pay

## 2023-11-26 DIAGNOSIS — Z86718 Personal history of other venous thrombosis and embolism: Secondary | ICD-10-CM | POA: Insufficient documentation

## 2023-11-26 NOTE — Telephone Encounter (Signed)
 She returned my call regarding PREP program at [place]; feels it is too far to drive from [place] so has declined for now; would be able to  attend if classes held in [place].

## 2023-11-27 ENCOUNTER — Ambulatory Visit (INDEPENDENT_AMBULATORY_CARE_PROVIDER_SITE_OTHER): Admitting: Family Medicine

## 2023-11-27 ENCOUNTER — Encounter (INDEPENDENT_AMBULATORY_CARE_PROVIDER_SITE_OTHER): Payer: Self-pay | Admitting: Family Medicine

## 2023-11-27 VITALS — BP 111/74 | HR 74 | Temp 98.4°F | Ht 66.0 in | Wt 182.0 lb

## 2023-11-27 DIAGNOSIS — Z6829 Body mass index (BMI) 29.0-29.9, adult: Secondary | ICD-10-CM | POA: Diagnosis not present

## 2023-11-27 DIAGNOSIS — E669 Obesity, unspecified: Secondary | ICD-10-CM | POA: Diagnosis not present

## 2023-11-27 DIAGNOSIS — Z6833 Body mass index (BMI) 33.0-33.9, adult: Secondary | ICD-10-CM

## 2023-11-27 DIAGNOSIS — R7303 Prediabetes: Secondary | ICD-10-CM

## 2023-11-27 MED ORDER — WEGOVY 0.25 MG/0.5ML ~~LOC~~ SOAJ: 0.2500 mg | SUBCUTANEOUS | 1 refills | Status: DC

## 2023-11-27 MED ORDER — WEGOVY 0.25 MG/0.5ML ~~LOC~~ SOAJ
0.2500 mg | SUBCUTANEOUS | 0 refills | Status: DC
Start: 2023-11-27 — End: 2023-11-27

## 2023-11-27 MED ORDER — METFORMIN HCL 500 MG PO TABS: ORAL_TABLET | ORAL | 0 refills | Status: DC

## 2023-11-27 NOTE — Progress Notes (Unsigned)
   SUBJECTIVE:  Chief Complaint: Obesity  Interim History: Patient here for follow up.  She was last seen by me in August of 2024.  Really changed out her eating habits.  She started Wegovy  and was able to start that at discounted rate.  She has adjusted to her medication- eating more frequent smaller meals.  She is able to get all the food in on Category 3.  She could not do what she was doing before in terms of frequency and quantity of food. Over the next few weeks she has no big plans and isn't planning to go anywhere.  Limiting in her walking due to back pain.  Maureen Randall is here to discuss her progress with her obesity treatment plan. She is on the Category 3 Plan and states she is following her eating plan approximately 40 % of the time. She states she is walking 20 minutes 3 times per week.   OBJECTIVE: Visit Diagnoses: Problem List Items Addressed This Visit   None   Vitals Temp: 98.4 F (36.9 C) BP: 111/74 Pulse Rate: 74 SpO2: 96 %   Anthropometric Measurements Height: 5\' 6"  (1.676 m) Weight: 182 lb (82.6 kg) BMI (Calculated): 29.39 Weight at Last Visit: 190 lb Weight Lost Since Last Visit: 8 lb Weight Gained Since Last Visit: 0 Starting Weight: 207 lb Total Weight Loss (lbs): 25 lb (11.3 kg)   Body Composition  Body Fat %: 43.8 % Fat Mass (lbs): 79.8 lbs Muscle Mass (lbs): 97.4 lbs Total Body Water  (lbs): 69.6 lbs Visceral Fat Rating : 10   Other Clinical Data Fasting: no Labs: no Today's Visit #: 11 Starting Date: 09/28/22 Comments: Cat 3     ASSESSMENT AND PLAN:  Diet: Maureen Randall is currently in the action stage of change. As such, her goal is to continue with weight loss efforts and has agreed to the Category 3 Plan.   Exercise:  For substantial health benefits, adults should do at least 150 minutes (2 hours and 30 minutes) a week of moderate-intensity, or 75 minutes (1 hour and 15 minutes) a week of vigorous-intensity aerobic physical activity, or an  equivalent combination of moderate- and vigorous-intensity aerobic activity. Aerobic activity should be performed in episodes of at least 10 minutes, and preferably, it should be spread throughout the week.  Behavior Modification:  We discussed the following Behavioral Modification Strategies today: increasing lean protein intake, decreasing simple carbohydrates, increasing vegetables, meal planning and cooking strategies, keeping healthy foods in the home, and planning for success. We discussed various medication options to help Maureen Randall with her weight loss efforts and we both agreed to continue Wegovy  at same dose she is on previously.  No follow-ups on file.   She was informed of the importance of frequent follow up visits to maximize her success with intensive lifestyle modifications for her multiple health conditions.  Attestation Statements:   Reviewed by clinician on day of visit: allergies, medications, problem list, medical history, surgical history, family history, social history, and previous encounter notes.   Time spent on visit including pre-visit chart review and post-visit care and charting was *** minutes  Donaciano Frizzle, MD

## 2023-12-02 NOTE — Assessment & Plan Note (Signed)
 On metformin  and doing well with no GI side effects.  Needs a refill today.  Will continue to monitor intake amounts now that patient is on GLP1 medication as well.

## 2023-12-02 NOTE — Assessment & Plan Note (Signed)
 Anthropometric Measurements Height: 5\' 6"  (1.676 m) Weight: 182 lb (82.6 kg) BMI (Calculated): 29.39 Weight at Last Visit: 190 lb Weight Lost Since Last Visit: 8 lb Weight Gained Since Last Visit: 0 Starting Weight: 207 lb Total Weight Loss (lbs): 25 lb (11.3 kg) Body Composition  Body Fat %: 43.8 % Fat Mass (lbs): 79.8 lbs Muscle Mass (lbs): 97.4 lbs Total Body Water  (lbs): 69.6 lbs Visceral Fat Rating : 10 Other Clinical Data Fasting: no Labs: no Today's Visit #: 11 Starting Date: 09/28/22 Comments: Cat 3

## 2023-12-06 ENCOUNTER — Telehealth: Payer: Self-pay | Admitting: Psychiatry

## 2023-12-06 NOTE — Addendum Note (Signed)
 Addended by: Lovie Rudder on: 12/06/2023 11:07 AM   Modules accepted: Orders

## 2023-12-09 ENCOUNTER — Ambulatory Visit (INDEPENDENT_AMBULATORY_CARE_PROVIDER_SITE_OTHER): Admitting: *Deleted

## 2023-12-09 VITALS — Ht 66.0 in | Wt 182.0 lb

## 2023-12-09 DIAGNOSIS — Z0000 Encounter for general adult medical examination without abnormal findings: Secondary | ICD-10-CM | POA: Diagnosis not present

## 2023-12-09 DIAGNOSIS — Z5941 Food insecurity: Secondary | ICD-10-CM

## 2023-12-09 DIAGNOSIS — Z599 Problem related to housing and economic circumstances, unspecified: Secondary | ICD-10-CM

## 2023-12-09 NOTE — Patient Instructions (Addendum)
 Maureen Randall , Thank you for taking time out of your busy schedule to complete your Annual Wellness Visit with me. I enjoyed our conversation and look forward to speaking with you again next year. I, as well as your care team,  appreciate your ongoing commitment to your health goals. Please review the following plan we discussed and let me know if I can assist you in the future. Your Game plan/ To Do List    Referrals: If you haven't heard from the office you've been referred to, please reach out to them at the phone provided.  Remember to call and schedule an eye appointment. Make sure you get a flu vaccine annually. Follow up Visits: Next Medicare AWV with our clinical staff: 12/10/23   Have you seen your provider in the last 6 months (3 months if uncontrolled diabetes)? Yes Next Office Visit with your provider: 01/28/24  Clinician Recommendations:  Aim for 30 minutes of exercise or brisk walking, 6-8 glasses of water , and 5 servings of fruits and vegetables each day.       This is a list of the screening recommended for you and due dates:  Health Maintenance  Topic Date Due   HIV Screening  Never done   Flu Shot  02/07/2024   Mammogram  10/03/2024   Medicare Annual Wellness Visit  12/08/2024   Pap with HPV screening  08/13/2025   DTaP/Tdap/Td vaccine (2 - Td or Tdap) 12/21/2029   Colon Cancer Screening  02/28/2030   Pneumococcal Vaccination  Completed   COVID-19 Vaccine  Completed   Hepatitis C Screening  Completed   Zoster (Shingles) Vaccine  Completed   HPV Vaccine  Aged Out   Meningitis B Vaccine  Aged Out    Advanced directives: (ACP Link)Information on Advanced Care Planning can be found at [employer]  Print production planner Health Care Directives Advance Health Care Directives. [email or web address]  Advance Care Planning is important because it:  [x]  Makes sure you receive the medical care that is consistent with your values, goals, and preferences  [x]  It provides guidance to  your family and loved ones and reduces their decisional burden about whether or not they are making the right decisions based on your wishes.  Follow the link provided in your after visit summary or read over the paperwork we have mailed to you to help you started getting your Advance Directives in place. If you need assistance in completing these, please reach out to us  so that we can help you!Managing Pain Without Opioids Opioids are strong medicines used to treat moderate to severe pain. For some people, especially those who have long-term (chronic) pain, opioids may not be the best choice for pain management due to: Side effects like nausea, constipation, and sleepiness. The risk of addiction (opioid use disorder). The longer you take opioids, the greater your risk of addiction. Pain that lasts for more than 3 months is called chronic pain. Managing chronic pain usually requires more than one approach and is often provided by a team of health care providers working together (multidisciplinary approach). Pain management may be done at a pain management center or pain clinic. How to manage pain without the use of opioids Use non-opioid medicines Non-opioid medicines for pain may include: Over-the-counter or prescription non-steroidal anti-inflammatory drugs (NSAIDs). These may be the first medicines used for pain. They work well for muscle and bone pain, and they reduce swelling. Acetaminophen . This over-the-counter medicine may work well for milder pain but not swelling.  Antidepressants. These may be used to treat chronic pain. A certain type of antidepressant (tricyclics) is often used. These medicines are given in lower doses for pain than when used for depression. Anticonvulsants. These are usually used to treat seizures but may also reduce nerve (neuropathic) pain. Muscle relaxants. These relieve pain caused by sudden muscle tightening (spasms). You may also use a pain medicine that is applied  to the skin as a patch, cream, or gel (topical analgesic), such as a numbing medicine. These may cause fewer side effects than medicines taken by mouth. Do certain therapies as directed Some therapies can help with pain management. They include: Physical therapy. You will do exercises to gain strength and flexibility. A physical therapist may teach you exercises to move and stretch parts of your body that are weak, stiff, or painful. You can learn these exercises at physical therapy visits and practice them at home. Physical therapy may also involve: Massage. Heat wraps or applying heat or cold to affected areas. Electrical signals that interrupt pain signals (transcutaneous electrical nerve stimulation, TENS). Weak lasers that reduce pain and swelling (low-level laser therapy). Signals from your body that help you learn to regulate pain (biofeedback). Occupational therapy. This helps you to learn ways to function at home and work with less pain. Recreational therapy. This involves trying new activities or hobbies, such as a physical activity or drawing. Mental health therapy, including: Cognitive behavioral therapy (CBT). This helps you learn coping skills for dealing with pain. Acceptance and commitment therapy (ACT) to change the way you think and react to pain. Relaxation therapies, including muscle relaxation exercises and mindfulness-based stress reduction. Pain management counseling. This may be individual, family, or group counseling.  Receive medical treatments Medical treatments for pain management include: Nerve block injections. These may include a pain blocker and anti-inflammatory medicines. You may have injections: Near the spine to relieve chronic back or neck pain. Into joints to relieve back or joint pain. Into nerve areas that supply a painful area to relieve body pain. Into muscles (trigger point injections) to relieve some painful muscle conditions. A medical device  placed near your spine to help block pain signals and relieve nerve pain or chronic back pain (spinal cord stimulation device). Acupuncture. Follow these instructions at home Medicines Take over-the-counter and prescription medicines only as told by your health care provider. If you are taking pain medicine, ask your health care providers about possible side effects to watch out for. Do not drive or use heavy machinery while taking prescription opioid pain medicine. Lifestyle  Do not use drugs or alcohol to reduce pain. If you drink alcohol, limit how much you have to: 0-1 drink a day for women who are not pregnant. 0-2 drinks a day for men. Know how much alcohol is in a drink. In the [place] one drink equals one 12 oz bottle of beer (355 mL), one 5 oz glass of wine (148 mL), or one 1 oz glass of hard liquor (44 mL). Do not use any products that contain nicotine or tobacco. These products include cigarettes, chewing tobacco, and vaping devices, such as e-cigarettes. If you need help quitting, ask your health care provider. Eat a healthy diet and maintain a healthy weight. Poor diet and excess weight may make pain worse. Eat foods that are high in fiber. These include fresh fruits and vegetables, whole grains, and beans. Limit foods that are high in fat and processed sugars, such as fried and sweet foods. Exercise regularly. Exercise lowers  stress and may help relieve pain. Ask your health care provider what activities and exercises are safe for you. If your health care provider approves, join an exercise class that combines movement and stress reduction. Examples include yoga and tai chi. Get enough sleep. Lack of sleep may make pain worse. Lower stress as much as possible. Practice stress reduction techniques as told by your therapist. General instructions Work with all your pain management providers to find the treatments that work best for you. You are an important member of your pain  management team. There are many things you can do to reduce pain on your own. Consider joining an online or in-person support group for people who have chronic pain. Keep all follow-up visits. This is important. Where to find more information You can find more information about managing pain without opioids from: American Academy of Pain Medicine: painmed.org Institute for Chronic Pain: instituteforchronicpain.org American Chronic Pain Association: theacpa.org Contact a health care provider if: You have side effects from pain medicine. Your pain gets worse or does not get better with treatments or home therapy. You are struggling with anxiety or depression. Summary Many types of pain can be managed without opioids. Chronic pain may respond better to pain management without opioids. Pain is best managed when you and a team of health care providers work together. Pain management without opioids may include non-opioid medicines, medical treatments, physical therapy, mental health therapy, and lifestyle changes. Tell your health care providers if your pain gets worse or is not being managed well enough. This information is not intended to replace advice given to you by your health care provider. Make sure you discuss any questions you have with your health care provider. Document Revised: 10/05/2020 Document Reviewed: 10/05/2020  Elsevier Patient Education  2024 [employer].

## 2023-12-09 NOTE — Progress Notes (Signed)
 Subjective:   Maureen Randall is a 58 y.o. who presents for a Medicare Wellness preventive visit.  As a reminder, Annual Wellness Visits don't include a physical exam, and some assessments may be limited, especially if this visit is performed virtually. We may recommend an in-person follow-up visit with your provider if needed.  Visit Complete: Virtual I connected with  Maureen Randall on 12/09/23 by a audio enabled telemedicine application and verified that I am speaking with the correct person using two identifiers.  Patient Location: Home  Provider Location: Home Office  I discussed the limitations of evaluation and management by telemedicine. The patient expressed understanding and agreed to proceed.  Vital Signs: Because this visit was a virtual/telehealth visit, some criteria may be missing or patient reported. Any vitals not documented were not able to be obtained and vitals that have been documented are patient reported.  VideoDeclined- This patient declined Librarian, academic. Therefore the visit was completed with audio only.  Persons Participating in Visit: Patient.  AWV Questionnaire: No: Patient Medicare AWV questionnaire was not completed prior to this visit.  Cardiac Risk Factors include: dyslipidemia     Objective:     Today's Vitals   12/09/23 1545  Weight: 182 lb (82.6 kg)  Height: 5\' 6"  (1.676 m)  PainSc: 6    Body mass index is 29.38 kg/m.     12/09/2023    4:03 PM 08/24/2023   11:31 AM 11/22/2022    6:22 AM 11/13/2022   11:43 AM 09/10/2022    9:45 AM 09/05/2022    8:05 AM 08/30/2022    8:22 AM  Advanced Directives  Does Patient Have a Medical Advance Directive? No No No No No No No  Would patient like information on creating a medical advance directive? No - Patient declined No - Patient declined No - Patient declined No - Patient declined  No - Patient declined No - Patient declined    Current Medications  (verified) Outpatient Encounter Medications as of 12/09/2023  Medication Sig   baclofen  (LIORESAL ) 10 MG tablet Take 1 tablet (10 mg total) by mouth 3 (three) times daily as needed for muscle spasms.   cetirizine (ZYRTEC) 10 MG tablet Take 10 mg by mouth daily as needed for allergies.   diclofenac  Sodium (VOLTAREN ) 1 % GEL Apply 2 g topically.   DULoxetine  (CYMBALTA ) 60 MG capsule TAKE 1 CAPSULE BY MOUTH DAILY   estradiol  (ESTRACE ) 0.1 MG/GM vaginal cream Place 1 Applicatorful vaginally once a week.   ezetimibe  (ZETIA ) 10 MG tablet Take 1 tablet (10 mg total) by mouth daily.   famotidine  (PEPCID ) 20 MG tablet Take 1 tablet (20 mg total) by mouth at bedtime.   fluticasone  (FLONASE ) 50 MCG/ACT nasal spray SHAKE LIQUID AND USE 1 TO 2 SPRAYS IN EACH NOSTRIL DAILY AS NEEDED. MAX DOSE 2 SPRAYS IN EACH NOSTRIL   hydrOXYzine  (ATARAX ) 10 MG tablet TAKE 1 TABLET BY MOUTH DAILY AS NEEDED FOR ANXIETY.   metFORMIN  (GLUCOPHAGE ) 500 MG tablet 1 tab daily with full meal   omeprazole  (PRILOSEC) 40 MG capsule Take 1 capsule (40 mg total) by mouth 2 (two) times daily.   ondansetron  (ZOFRAN ) 8 MG tablet Take by mouth.   ondansetron  (ZOFRAN -ODT) 4 MG disintegrating tablet Take 0.5 tablets (2 mg total) by mouth every 6 (six) hours as needed.   oxyCODONE -acetaminophen  (PERCOCET) 10-325 MG tablet Take 1 tablet by mouth in the morning, at noon, in the evening, and at bedtime.  pregabalin  (LYRICA ) 100 MG capsule Take 100 mg by mouth 3 (three) times daily.   psyllium (METAMUCIL SMOOTH TEXTURE) 28 % packet Take 1 packet by mouth every other day.   rivaroxaban  (XARELTO ) 10 MG TABS tablet Take 10 mg by mouth in the morning. (1000)   saccharomyces boulardii (FLORASTOR) 250 MG capsule Take 1 capsule (250 mg total) by mouth daily.   Semaglutide -Weight Management (WEGOVY ) 0.25 MG/0.5ML SOAJ Inject 0.25 mg into the skin once a week.   sucralfate  (CARAFATE ) 1 g tablet TAKE 1 TABLET BY MOUTH 4 TIMES A DAY   traZODone  (DESYREL ) 100  MG tablet Take 2 tablets (200 mg total) by mouth at bedtime as needed for sleep.   TURMERIC PO Take 2,000 mg by mouth in the morning. 1000 mg/capsule   valACYclovir  (VALTREX ) 500 MG tablet Take 500 mg by mouth 2 (two) times daily as needed (outbreaks).   Buprenorphine  HCl (BELBUCA ) 450 MCG FILM Place 450 mcg inside cheek in the morning and at bedtime. (Patient not taking: Reported on 12/09/2023)   No facility-administered encounter medications on file as of 12/09/2023.    Allergies (verified) Amoxicillin, Sulfamethoxazole-trimethoprim, Rosuvastatin , Sulfamethoxazole, Trimethoprim, Zanaflex [tizanidine], and Keflex [cephalexin]   History: Past Medical History:  Diagnosis Date   Abnormal glucose    Adenoma of left adrenal gland 07/26/2020   Anterior to posterior tear of superior glenoid labrum of right shoulder 11/03/2020   Anxiety    Arthralgia of multiple joints 08/31/2015   Arthritis    osteoarthritis-joints, feet, shoulder   Asthma    previously diagnosed after a bout of bronchitis/ no problems since   Biceps tendinitis 09/10/2019   Bulging of cervical intervertebral disc without myelopathy    C6-C7   Bursitis    b/l hips   Chronic deep vein thrombosis (DVT) of both lower extremities (HCC)    Chronic pain syndrome    Closed fracture of metatarsal bone 08/06/2017   COPD (chronic obstructive pulmonary disease) (HCC) 2004   patient states probably bronchitis   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   Generalized osteoarthritis    GERD (gastroesophageal reflux disease)    H/O nasal septoplasty 09/10/2019   Headache, post-myelogram 09/11/2021   Headache,  post-myelogram 09/11/2021   Hiatal hernia    History of lumbar fusion 01/29/2018   History of right hip replacement (12/2017) 01/29/2018   Hyperlipidemia    Increased band cell count 08/31/2015   Infection of superficial incisional surgical site after procedure 06/07/2021   IUD threads lost 04/30/2018   Left leg DVT (HCC)    fem/pop 12/07/20 after right shoulder surgery 11/30/20   Low back pain, chronic    Menopausal flushing 11/30/2020   Mood swings 03/15/2021   Neuropathy    post surgery   Obesity    Open wound 07/04/2021   Open wound of left lower leg 06/07/2021   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 08/17/2019   Peroneal neuropathy, left    Pneumonia    11/2021 [facility] at [facility]   PONV (postoperative nausea and vomiting)    Postoperative nausea and vomiting    Preoperative clearance 09/10/2019   PVD (peripheral vascular disease) (HCC)  Recurrent falls    S/P shoulder replacement, right 12/22/2021   Sinusitis    Special screening for malignant neoplasms, colon    Spondylolisthesis    Strain of foot 01/17/2017   Venous insufficiency of both lower extremities 10/20/2018   Past Surgical History:  Procedure Laterality Date   ABLATION     veins in lower legs   BACK SURGERY  07/2014   lumbar spine/ fusion 1 level   BACK SURGERY  07/08/2023   BREAST BIOPSY Left 2011   neg   CARPAL TUNNEL RELEASE Right 12/2015   [facility]   CARPAL TUNNEL RELEASE Left 12/2016   CERVICAL DISC ARTHROPLASTY N/A 09/05/2022   Procedure: CERVICAL SIX-CERVICAL SEVEN ARTIFICIAL DISC REPLACEMENT;  Surgeon: Audie Bleacher, MD;  Location: [facility] OR;  Service: Neurosurgery;  Laterality: N/A;  RM 18/3C   COLONOSCOPY WITH PROPOFOL  N/A 02/29/2020   Procedure: COLONOSCOPY WITH PROPOFOL ;  Surgeon: Marnee Sink, MD;  Location: [facility];  Service: Endoscopy;  Laterality: N/A;   ESOPHAGOGASTRODUODENOSCOPY (EGD) WITH PROPOFOL  N/A 02/29/2020   Procedure: ESOPHAGOGASTRODUODENOSCOPY (EGD)  WITH PROPOFOL ;  Surgeon: Marnee Sink, MD;  Location: [facility];  Service: Endoscopy;  Laterality: N/A;   FOOT SURGERY Right 2015   broken navicular bone   HIP ARTHROSCOPY Right 05/2011   HIP SURGERY     left hip surgery 05/2018   HYSTEROSCOPY N/A 11/22/2022   Procedure: HYSTEROSCOPY, IUD REMOVAL;  Surgeon: Prescilla Brod, MD;  Location: [facility] ORS;  Service: Gynecology;  Laterality: N/A;   JOINT REPLACEMENT Bilateral 12/2017 & 05/2018   hip   L5-S1 OLIF Dr. Jaquita Merl back surgery 09/2019  09/28/2019   left ganglion cyst removal     12/23/19 Dr. Ulanda Gambles   MASS EXCISION Left 12/17/2019   Procedure: EXCISION TUMOR FOOT DEEP LEFT;  Surgeon: Sharlyn Deaner, DPM;  Location: [facility];  Service: Podiatry;  Laterality: Left;  LMA W/LOCAL   NASAL TURBINATE REDUCTION Bilateral 06/12/2019   Procedure: TURBINATE REDUCTION/SUBMUCOSAL RESECTION;  Surgeon: Lesly Raspberry, MD;  Location: [facility];  Service: ENT;  Laterality: Bilateral;   REPLACEMENT TOTAL HIP W/  RESURFACING IMPLANTS Right 12/2017   [facility]   right shoulder surgery     11/28/20 for bone spur, torn labrum, re attached biceps tendon, tear Dr. Brunilda Capra   right shoulder surgery     12/22/21 total shoulder replacement [facility] Dr. Suszanne Eriksson CUFF REPAIR     Dr. Brunilda Capra in 04/2019    SCLEROTHERAPY     veins in lower legs   SEPTOPLASTY Bilateral 06/12/2019   Procedure: SEPTOPLASTY;  Surgeon: Lesly Raspberry, MD;  Location: [facility];  Service: ENT;  Laterality: Bilateral;   SHOULDER SURGERY Right 12/2015    [facility]tho torn rotator cuff   SPINAL FUSION  2016   [facility] lumbar   TOTAL SHOULDER ARTHROPLASTY Right 12/22/2021   Procedure: TOTAL SHOULDER ARTHROPLASTY;  Surgeon: Winston Hawking, MD;  Location: [facility];  Service: Orthopedics;  Laterality: Right;  with ISB   Family History  Problem Relation Age of Onset   Breast cancer Maternal Aunt    Breast cancer Paternal  Aunt    Dementia Paternal Aunt    COPD Mother        quit smoking after 60 years   Arthritis Mother    Osteoarthritis Mother    Dementia Father        symptoms started in his 22s   Alzheimer's disease Father    Rheum arthritis Father  Rheum arthritis Other        FH   Dementia Other        "all my dad's side of the family"   Other Maternal Grandmother        got dementia as she got older    Dementia Paternal Grandmother    Social History   Socioeconomic History   Marital status: Married    Spouse name: fredrick   Number of children: 1   Years of education: Not on file   Highest education level: 12th grade  Occupational History    Comment: full time  Tobacco Use   Smoking status: Never   Smokeless tobacco: Never  Vaping Use   Vaping status: Never Used  Substance and Sexual Activity   Alcohol use: Not Currently    Alcohol/week: 1.0 standard drink of alcohol    Types: 1 Cans of beer per week    Comment: may have drink 1x/month   Drug use: Never   Sexual activity: Yes  Other Topics Concern   Not on file  Social History Narrative   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    Married 1 child    Lives at home with her husband   Right handed   Caffeine: maybe 1 cup/day (pepsi)   Social Drivers of Corporate investment banker Strain: Low Risk  (12/09/2023)   Overall Financial Resource Strain (CARDIA)    Difficulty of Paying Living Expenses: Not hard at all  Recent Concern: Financial Resource Strain - High Risk (09/19/2023)   Received from [facility]   Overall Financial Resource Strain (CARDIA)    Difficulty of Paying Living Expenses: Very hard  Food Insecurity: Food Insecurity Present (12/09/2023)   Hunger Vital Sign    Worried About Running Out of Food in the Last Year: Sometimes true    Ran Out of Food in the Last Year: Sometimes true  Transportation Needs: No Transportation Needs (12/09/2023)   PRAPARE - Scientist, research (physical sciences) (Medical): No    Lack of Transportation (Non-Medical): No  Physical Activity: Inactive (12/09/2023)   Exercise Vital Sign    Days of Exercise per Week: 0 days    Minutes of Exercise per Session: 0 min  Stress: No Stress Concern Present (12/09/2023)   [employer] of Occupational Health - Occupational Stress Questionnaire    Feeling of Stress : Only a little  Recent Concern: Stress - Stress Concern Present (09/19/2023)   Received from [facility] of Occupational Health - Occupational Stress Questionnaire    Feeling of Stress : Rather much  Social Connections: Moderately Isolated (12/09/2023)   Social Connection and Isolation Panel [NHANES]    Frequency of Communication with Friends and Family: More than three times a week    Frequency of Social Gatherings with Friends and Family: More than three times a week    Attends Religious Services: Never    Database administrator or Organizations: No    Attends Engineer, structural: Never    Marital Status: Married    Tobacco Counseling Counseling given: Not Answered    Clinical Intake:  Pre-visit preparation completed: Yes  Pain : 0-10 Pain Score: 6  Pain Type: Chronic pain Pain Location: Back (and foot) Pain Descriptors / Indicators: Constant, Shooting Pain Onset: More than a month ago     BMI - recorded: 29.38 Nutritional Status: BMI 25 -29 Overweight Nutritional Risks: None Diabetes:  No  Lab Results  Component Value Date   HGBA1C 5.7 (H) 04/29/2023   HGBA1C 5.6 11/05/2022   HGBA1C 5.5 12/14/2021     How often do you need to have someone help you when you read instructions, pamphlets, or other written materials from your doctor or pharmacy?: 1 - Never  Interpreter Needed?: No  Information entered by :: R. Christella App LPN   Activities of Daily Living     12/09/2023    3:48 PM  In your present state of health, do you have any difficulty performing the following  activities:  Hearing? 0  Vision? 0  Comment glasses  Difficulty concentrating or making decisions? 0  Walking or climbing stairs? 1  Dressing or bathing? 0  Doing errands, shopping? 1  Preparing Food and eating ? N  Using the Toilet? N  In the past six months, have you accidently leaked urine? Y  Do you have problems with loss of bowel control? N  Managing your Medications? N  Managing your Finances? N  Housekeeping or managing your Housekeeping? Y    Patient Care Team: Bluford Burkitt, NP as PCP - General (Nurse Practitioner) Hazle Lites, MD as PCP - Cardiology (Cardiology) Prescilla Brod, MD as Consulting Physician (Obstetrics and Gynecology)  I have updated your Care Teams any recent Medical Services you may have received from other providers in the past year.     Assessment:    This is a routine wellness examination for [employer].  Hearing/Vision screen Hearing Screening - Comments:: No issues Vision Screening - Comments:: glasses   Goals Addressed             This Visit's Progress    Patient Stated       Wants to be more active       Depression Screen     12/09/2023    3:55 PM 10/31/2023   11:05 AM 02/20/2023    5:40 PM 01/07/2023    2:40 PM 08/16/2022   10:45 AM 08/06/2022   11:18 AM 05/14/2022    3:18 PM  PHQ 2/9 Scores  PHQ - 2 Score 0   1  2   PHQ- 9 Score 2     4      Information is confidential and restricted. Go to Review Flowsheets to unlock data.    Fall Risk     12/09/2023    3:50 PM 01/07/2023    2:39 PM 08/06/2022   11:17 AM 04/17/2022    8:37 AM 12/15/2021    8:49 AM  Fall Risk   Falls in the past year? 1 1 1 1 1   Number falls in past yr: 1 1 1 1 1   Injury with Fall? 1 1 1 1 1   Comment  right sprained ankle     Risk for fall due to : History of fall(s);Impaired balance/gait;Impaired mobility Other (Comment) No Fall Risks History of fall(s);Impaired balance/gait History of fall(s)  Follow up Falls evaluation completed;Falls prevention  discussed Falls evaluation completed  Falls evaluation completed Falls evaluation completed    MEDICARE RISK AT HOME:  Medicare Risk at Home Any stairs in or around the home?: Yes If so, are there any without handrails?: No Home free of loose throw rugs in walkways, pet beds, electrical cords, etc?: Yes Adequate lighting in your home to reduce risk of falls?: Yes Life alert?: No Use of a cane, walker or w/c?: Yes Grab bars in the bathroom?: Yes Shower chair or bench in shower?: Yes  Elevated toilet seat or a handicapped toilet?: No  TIMED UP AND GO:  Was the test performed?  No  Cognitive Function: 6CIT completed    09/23/2019    8:04 AM  MMSE - Mini Mental State Exam  Orientation to time 5  Orientation to Place 5  Registration 3  Attention/ Calculation 4  Recall 3  Language- name 2 objects 2  Language- repeat 1  Language- follow 3 step command 3  Language- read & follow direction 1  Write a sentence 0  Write a sentence-comments "take four steps backward"- did not have subject.  Copy design 0  Total score 27        12/09/2023    4:06 PM 12/09/2023    4:04 PM  6CIT Screen  What Year? 0 points 0 points  What month? 0 points 0 points  What time? 0 points 0 points  Count back from 20 0 points 0 points  Months in reverse 0 points 0 points  Repeat phrase 0 points 2 points  Total Score 0 points 2 points    Immunizations Immunization History  Administered Date(s) Administered   Influenza,inj,Quad PF,6+ Mos 04/17/2022   Moderna Covid-19 Fall Seasonal Vaccine 56yrs & older 05/01/2023   PFIZER Comirnaty(Gray Top)Covid-19 Tri-Sucrose Vaccine 11/11/2020   PFIZER(Purple Top)SARS-COV-2 Vaccination 10/17/2019, 11/10/2019, 06/08/2020   PNEUMOCOCCAL CONJUGATE-20 02/13/2022   Tdap 12/22/2019   Zoster Recombinant(Shingrix) 08/17/2020, 10/25/2020    Screening Tests Health Maintenance  Topic Date Due   HIV Screening  Never done   INFLUENZA VACCINE  02/07/2024   MAMMOGRAM   10/03/2024   Medicare Annual Wellness (AWV)  12/08/2024   Cervical Cancer Screening (HPV/Pap Cotest)  08/13/2025   DTaP/Tdap/Td (2 - Td or Tdap) 12/21/2029   Colonoscopy  02/28/2030   Pneumococcal Vaccine 20-95 Years old  Completed   COVID-19 Vaccine  Completed   Hepatitis C Screening  Completed   Zoster Vaccines- Shingrix  Completed   HPV VACCINES  Aged Out   Meningococcal B Vaccine  Aged Out    Health Maintenance  Health Maintenance Due  Topic Date Due   HIV Screening  Never done   Health Maintenance Items Addressed: Discussed the need to get an annual flu vaccine.  Additional Screening:  Vision Screening: Recommended annual ophthalmology exams for early detection of glaucoma and other disorders of the eye. [employer]. Advised patient that she should call and schedule an appointment. Would you like a referral to an eye doctor? No    Dental Screening: Recommended annual dental exams for proper oral hygiene  Community Resource Referral / Chronic Care Management: CRR required this visit?  No   CCM required this visit?  No   Plan:    I have personally reviewed and noted the following in the patient's chart:   Medical and social history Use of alcohol, tobacco or illicit drugs  Current medications and supplements including opioid prescriptions. Patient is currently taking opioid prescriptions. Information provided to patient regarding non-opioid alternatives. Patient advised to discuss non-opioid treatment plan with their provider. Functional ability and status Nutritional status Physical activity Advanced directives List of other physicians Hospitalizations, surgeries, and ER visits in previous 12 months Vitals Screenings to include cognitive, depression, and falls Referrals and appointments  In addition, I have reviewed and discussed with patient certain preventive protocols, quality metrics, and best practice recommendations. A written personalized care  plan for preventive services as well as general preventive health recommendations were provided to patient.   Felicitas Horse,  LPN   03/14/453   After Visit Summary: (MyChart) Due to this being a telephonic visit, the after visit summary with patients personalized plan was offered to patient via MyChart   Notes: Nothing significant to report at this time.

## 2023-12-10 ENCOUNTER — Ambulatory Visit
Admission: RE | Admit: 2023-12-10 | Discharge: 2023-12-10 | Disposition: A | Discharge status: Home / Self Care | Source: Ambulatory Visit | Attending: Obstetrics and Gynecology | Admitting: Obstetrics and Gynecology

## 2023-12-10 DIAGNOSIS — Z1231 Encounter for screening mammogram for malignant neoplasm of breast: Secondary | ICD-10-CM | POA: Insufficient documentation

## 2023-12-10 NOTE — Addendum Note (Signed)
 Addended by: Felicitas Horse C on: 12/10/2023 08:13 AM   Modules accepted: Orders

## 2023-12-16 ENCOUNTER — Ambulatory Visit (INDEPENDENT_AMBULATORY_CARE_PROVIDER_SITE_OTHER): Admitting: Adult Health

## 2023-12-16 ENCOUNTER — Encounter (INDEPENDENT_AMBULATORY_CARE_PROVIDER_SITE_OTHER): Payer: Self-pay | Admitting: Adult Health

## 2023-12-16 VITALS — BP 118/74 | HR 63 | Temp 97.8°F | Ht 66.0 in | Wt 181.0 lb

## 2023-12-16 DIAGNOSIS — K219 Gastro-esophageal reflux disease without esophagitis: Secondary | ICD-10-CM | POA: Diagnosis not present

## 2023-12-16 DIAGNOSIS — Z6833 Body mass index (BMI) 33.0-33.9, adult: Secondary | ICD-10-CM

## 2023-12-16 DIAGNOSIS — Z6829 Body mass index (BMI) 29.0-29.9, adult: Secondary | ICD-10-CM

## 2023-12-16 DIAGNOSIS — E669 Obesity, unspecified: Secondary | ICD-10-CM

## 2023-12-16 DIAGNOSIS — E538 Deficiency of other specified B group vitamins: Secondary | ICD-10-CM | POA: Diagnosis not present

## 2023-12-16 DIAGNOSIS — E559 Vitamin D deficiency, unspecified: Secondary | ICD-10-CM

## 2023-12-16 DIAGNOSIS — R7303 Prediabetes: Secondary | ICD-10-CM | POA: Diagnosis not present

## 2023-12-16 MED ORDER — WEGOVY 0.5 MG/0.5ML ~~LOC~~ SOAJ: 0.5000 mg | SUBCUTANEOUS | 0 refills | Status: DC

## 2023-12-16 NOTE — Progress Notes (Signed)
 WEIGHT SUMMARY AND BIOMETRICS  Vitals Temp: 97.8 F (36.6 C) BP: 118/74 Pulse Rate: 63 SpO2: 100 %   Anthropometric Measurements Height: 5\' 6"  (1.676 m) Weight: 181 lb (82.1 kg) BMI (Calculated): 29.23 Weight at Last Visit: 182 lb Weight Lost Since Last Visit: 1 lb Weight Gained Since Last Visit: 0 Starting Weight: 207 lb Total Weight Loss (lbs): 26 lb (11.8 kg)   Body Composition  Body Fat %: 44.1 % Fat Mass (lbs): 80 lbs Muscle Mass (lbs): 96.4 lbs Total Body Water  (lbs): 69.41 lbs Visceral Fat Rating : 11   Other Clinical Data Fasting: yes Labs: no Today's Visit #: 12 Starting Date: 11/28/22 Comments: Cat 3    Chief Complaint:   OBESITY Maureen Randall is here to discuss her progress with her obesity treatment plan.  She is on the the Category 3 Plan and states she is following her eating plan approximately 50 % of the time.  She states she is exercising Walking 20-30 minutes 3 times per week.  Interim History:  Initially provided Wegovy  Rx August 2024 by Dr, Pink Bridges She did not start due to cost.  She recently used a manufacturer coupon and was able to afford Wegovy . Per pt, with coupon out of pocket cost is $4.99 per month  She started Wegovy  0.25mg  on/about 11/04/2023  She has remained on loading dose and now reports tolerating GLP-1 without any SE Denies mass in neck, dysphagia, dyspepsia, persistent hoarseness, abdominal pain, or N/V/C  She administers on Mondays and reports breakthrough polyphagia mid week She would like to increase dose- discussed risks/benefits  Subjective:   1. Gastroesophageal reflux disease without esophagitis She reports well controlled GERd She has hx oh hiatal hernia and hx of gastritis She is currently taking sucralfate  (CARAFATE ) 1 g tablet- BID  famotidine  (PEPCID ) 20 MG tablet- BID  ondansetron  (ZOFRAN ) 8 MG tablet- PRN, not needed in last several weeks  omeprazole  (PRILOSEC) 40 MG capsule- daily and up to BID when  needed   2. B12 deficiency She is not on any OTC supplementation  3. Prediabetes Lab Results  Component Value Date   HGBA1C 5.7 (H) 04/29/2023   HGBA1C 5.6 11/05/2022   HGBA1C 5.5 12/14/2021    She is currently on daily Metformin  500mg  and weekly Wegovy  0.25mg   She started Wegovy  0.25mg  on/about 11/04/2023  She has remained on loading dose and now reports tolerating GLP-1 without any SE Denies mass in neck, dysphagia, dyspepsia, persistent hoarseness, abdominal pain, or N/V/C  She administers on Mondays and reports breakthrough polyphagia mid week She would like to increase dose- discussed risks/benefits  4. Vitamin D  deficiency She is not on any OTC supplementation She endorses excellent energy   Assessment/Plan:   1. Gastroesophageal reflux disease without esophagitis Avoid known trigger foods Do not overeat Do not eat too close to bedtime  2. B12 deficiency (Primary) Check Labs - Vitamin B12  3. Prediabetes Check Labs - Hemoglobin A1c - Insulin , random Continue Metformin  500mg  daily- does not require refill today Refill and INCREASE-Wegovy  0.5mg  inject subcutaneous weekly  4. Vitamin D  deficiency Check Labs - VITAMIN D  25 Hydroxy (Vit-D Deficiency, Fractures)  5. Obesity (BMI 30-39.9), CURRENT BMI 29.3 Refill and INCREASE []   Semaglutide -Weight Management (WEGOVY ) 0.5 MG/0.5ML SOAJ Inject 0.5 mg into the skin once a week. Dispense: 2 mL, Refills: 0 ordered   Maureen Randall is currently in the action stage of change. As such, her goal is to continue with weight loss efforts. She has agreed to  the Category 3 Plan.   Exercise goals: For substantial health benefits, adults should do at least 150 minutes (2 hours and 30 minutes) a week of moderate-intensity, or 75 minutes (1 hour and 15 minutes) a week of vigorous-intensity aerobic physical activity, or an equivalent combination of moderate- and vigorous-intensity aerobic activity. Aerobic activity should be performed in  episodes of at least 10 minutes, and preferably, it should be spread throughout the week.  Behavioral modification strategies: increasing lean protein intake, decreasing simple carbohydrates, increasing vegetables, increasing water  intake, no skipping meals, meal planning and cooking strategies, keeping healthy foods in the home, and planning for success.  Maureen Randall has agreed to follow-up with our clinic in 3 weeks. She was informed of the importance of frequent follow-up visits to maximize her success with intensive lifestyle modifications for her multiple health conditions.   Maureen Randall was informed we would discuss her lab results at her next visit unless there is a critical issue that needs to be addressed sooner. Maureen Randall agreed to keep her next visit at the agreed upon time to discuss these results.  Objective:   Blood pressure 118/74, pulse 63, temperature 97.8 F (36.6 C), height 5\' 6"  (1.676 m), weight 181 lb (82.1 kg), SpO2 100%. Body mass index is 29.21 kg/m.  General: Cooperative, alert, well developed, in no acute distress. HEENT: Conjunctivae and lids unremarkable. Cardiovascular: Regular rhythm.  Lungs: Normal work of breathing. Neurologic: No focal deficits.   Lab Results  Component Value Date   CREATININE 0.77 10/31/2023   BUN 18 10/31/2023   NA 143 10/31/2023   K 4.2 10/31/2023   CL 105 10/31/2023   CO2 25 10/31/2023   Lab Results  Component Value Date   ALT 19 10/31/2023   AST 19 10/31/2023   ALKPHOS 80 10/31/2023   BILITOT 0.3 10/31/2023   Lab Results  Component Value Date   HGBA1C 5.7 (H) 04/29/2023   HGBA1C 5.6 11/05/2022   HGBA1C 5.5 12/14/2021   HGBA1C 5.5 09/25/2019   HGBA1C 5.7 01/05/2019   Lab Results  Component Value Date   INSULIN  8.2 04/29/2023   INSULIN  13.7 11/28/2022   Lab Results  Component Value Date   TSH 3.04 11/05/2022   Lab Results  Component Value Date   CHOL 243 (H) 11/06/2023   HDL 82 11/06/2023   LDLCALC 147 (H) 11/06/2023    TRIG 83 11/06/2023   CHOLHDL 3.0 11/06/2023   Lab Results  Component Value Date   VD25OH 73.6 04/29/2023   VD25OH 59.4 11/28/2022   VD25OH 48.02 01/05/2019   Lab Results  Component Value Date   WBC 7.2 11/16/2022   HGB 12.5 11/16/2022   HCT 38.3 11/16/2022   MCV 93.2 11/16/2022   PLT 338 11/16/2022   Lab Results  Component Value Date   IRON 77 09/25/2019   TIBC 336 09/25/2019   FERRITIN 37 09/25/2019   Attestation Statements:   Reviewed by clinician on day of visit: allergies, medications, problem list, medical history, surgical history, family history, social history, and previous encounter notes.  I have reviewed the above documentation for accuracy and completeness, and I agree with the above. -  Tya Haughey d. Ezinne Yogi, NP-C

## 2023-12-17 LAB — INSULIN, RANDOM: INSULIN: 12.1 u[IU]/mL (ref 2.6–24.9)

## 2023-12-17 LAB — VITAMIN B12: Vitamin B-12: 341 pg/mL (ref 232–1245)

## 2023-12-17 LAB — VITAMIN D 25 HYDROXY (VIT D DEFICIENCY, FRACTURES): Vit D, 25-Hydroxy: 76.4 ng/mL (ref 30.0–100.0)

## 2023-12-17 LAB — HEMOGLOBIN A1C
Est. average glucose Bld gHb Est-mCnc: 111 mg/dL
Hgb A1c MFr Bld: 5.5 % (ref 4.8–5.6)

## 2023-12-19 ENCOUNTER — Encounter: Payer: Self-pay | Admitting: Psychiatry

## 2023-12-19 ENCOUNTER — Telehealth: Payer: Self-pay | Admitting: Psychiatry

## 2023-12-19 DIAGNOSIS — R52 Pain, unspecified: Secondary | ICD-10-CM | POA: Diagnosis not present

## 2023-12-19 DIAGNOSIS — G4701 Insomnia due to medical condition: Secondary | ICD-10-CM | POA: Diagnosis not present

## 2023-12-19 DIAGNOSIS — F411 Generalized anxiety disorder: Secondary | ICD-10-CM | POA: Diagnosis not present

## 2023-12-19 NOTE — Progress Notes (Signed)
 Virtual Visit via Video Note  I connected with Maureen Randall on 12/19/23 at  4:40 PM EDT by a video enabled telemedicine application and verified that I am speaking with the correct person using two identifiers.  Location Provider Location : [facility] Patient Location : Home  Participants: Patient , Provider   I discussed the limitations of evaluation and management by telemedicine and the availability of in person appointments. The patient expressed understanding and agreed to proceed.   I discussed the assessment and treatment plan with the patient. The patient was provided an opportunity to ask questions and all were answered. The patient agreed with the plan and demonstrated an understanding of the instructions.   The patient was advised to call back or seek an in-person evaluation if the symptoms worsen or if the condition fails to improve as anticipated.   [facility] MD OP Progress Note  12/20/2023 12:22 PM ZAIAH CREDEUR  MRN:  [id number]  Chief Complaint:  Chief Complaint  Patient presents with   Follow-up   Anxiety   Depression   Medication Refill   HPI: Maureen Randall is a 58 year old Caucasian female, lives in [place], disability, has a history of generalized anxiety disorder, chronic pain, insomnia, primary osteoarthritis, radiculopathy, spondylolisthesis, bilateral carpal tunnel syndrome, recent back surgery was evaluated by telemedicine today.  She is currently not taking Topamax  which was started last visit.  Reports she had side effects of word-finding difficulty and memory changes and hence stopped taking it.  She continues to be in a lot of pain currently and has upcoming appointment scheduled with her provider.  Lack of pain control does affect her mood as well as sleep.  She cannot find a comfortable position to sleep at night due to the pain.  Currently taking trazodone  which helps when pain is under control.  Does report sadness and irritability mostly because of pain .   Reports appetite is low since she is on Wegovy  and is trying to lose weight.  She denies any suicidality, homicidality or perceptual disturbances.  Currently compliant on her medications like Cymbalta , denies side effects.  Does have hydroxyzine  available however has not used it and is interested in trying it.  Has not been able to find a therapist however motivated to establish care with the therapist.  Currently not interested in addition of further medications and would like to get her pain under control first.  Denies any other concerns today.  Visit Diagnosis:    ICD-10-CM   1. GAD (generalized anxiety disorder)  F41.1     2. Insomnia due to medical condition  G47.01    Pain      Past Psychiatric History: I have reviewed past psychiatric history from progress note on 04/30/2018.  Past Medical History:  Past Medical History:  Diagnosis Date   Abnormal glucose    Adenoma of left adrenal gland 07/26/2020   Anterior to posterior tear of superior glenoid labrum of right shoulder 11/03/2020   Anxiety    Arthralgia of multiple joints 08/31/2015   Arthritis    osteoarthritis-joints, feet, shoulder   Asthma    previously diagnosed after a bout of bronchitis/ no problems since   Biceps tendinitis 09/10/2019   Bulging of cervical intervertebral disc without myelopathy    C6-C7   Bursitis    b/l hips   Chronic deep vein thrombosis (DVT) of both lower extremities (HCC)    Chronic pain syndrome    Closed fracture of metatarsal bone 08/06/2017  COPD (chronic obstructive pulmonary disease) (HCC) 2004   patient states probably bronchitis   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 after surgery, while on BCP   Elevated blood-pressure reading, without diagnosis of hypertension 04/24/2021   Facet arthropathy, lumbosacral 07/27/2014   Failed back  surgical syndrome 09/11/2021   Generalized osteoarthritis    GERD (gastroesophageal reflux disease)    H/O nasal septoplasty 09/10/2019   Headache, post-myelogram 09/11/2021   Headache, post-myelogram 09/11/2021   Hiatal hernia    History of lumbar fusion 01/29/2018   History of right hip replacement (12/2017) 01/29/2018   Hyperlipidemia    Increased band cell count 08/31/2015   Infection of superficial incisional surgical site after procedure 06/07/2021   IUD threads lost 04/30/2018   Left leg DVT (HCC)    fem/pop 12/07/20 after right shoulder surgery 11/30/20   Low back pain, chronic    Menopausal flushing 11/30/2020   Mood swings 03/15/2021   Neuropathy    post surgery   Obesity    Open wound 07/04/2021   Open wound of left lower leg 06/07/2021   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 08/17/2019   Peroneal neuropathy, left    Pneumonia    11/2021 [facility] at [facility]   PONV (postoperative nausea and vomiting)    Postoperative nausea and vomiting    Preoperative clearance 09/10/2019   PVD (peripheral vascular disease) (HCC)    Recurrent falls    S/P shoulder replacement, right 12/22/2021   Sinusitis    Special screening for malignant neoplasms, colon    Spondylolisthesis    Strain of foot 01/17/2017   Venous insufficiency of both lower extremities 10/20/2018    Past Surgical History:  Procedure Laterality Date   ABLATION     veins in lower legs   BACK SURGERY  07/2014   lumbar spine/ fusion 1 level   BACK SURGERY  07/08/2023   BREAST BIOPSY Left 2011   neg   CARPAL TUNNEL RELEASE Right 12/2015   [facility]   CARPAL TUNNEL RELEASE Left 12/2016   CERVICAL DISC ARTHROPLASTY N/A 09/05/2022   Procedure: CERVICAL SIX-CERVICAL SEVEN ARTIFICIAL DISC REPLACEMENT;  Surgeon: Audie Bleacher, MD;  Location: [facility];  Service: Neurosurgery;  Laterality: N/A;  RM 18/3C   COLONOSCOPY WITH PROPOFOL  N/A 02/29/2020   Procedure: COLONOSCOPY WITH PROPOFOL ;  Surgeon: Marnee Sink, MD;  Location: [facility];  Service: Endoscopy;  Laterality: N/A;   ESOPHAGOGASTRODUODENOSCOPY (EGD) WITH PROPOFOL  N/A 02/29/2020   Procedure: ESOPHAGOGASTRODUODENOSCOPY (EGD) WITH PROPOFOL ;  Surgeon: Marnee Sink, MD;  Location: [facility];  Service: Endoscopy;  Laterality: N/A;   FOOT SURGERY Right 2015   broken navicular bone   HIP ARTHROSCOPY Right 05/2011   HIP SURGERY     left hip surgery 05/2018   HYSTEROSCOPY N/A 11/22/2022   Procedure: HYSTEROSCOPY, IUD REMOVAL;  Surgeon: Prescilla Brod, MD;  Location: [facility] ORS;  Service: Gynecology;  Laterality: N/A;   JOINT REPLACEMENT Bilateral 12/2017 & 05/2018   hip   L5-S1 OLIF Dr. Jaquita Merl back surgery 09/2019  09/28/2019   left ganglion cyst removal     12/23/19 Dr. Ulanda Gambles   MASS EXCISION Left 12/17/2019   Procedure: EXCISION TUMOR FOOT DEEP LEFT;  Surgeon: Sharlyn Deaner, DPM;  Location: [facility];  Service: Podiatry;  Laterality: Left;  LMA W/LOCAL   NASAL  TURBINATE REDUCTION Bilateral 06/12/2019   Procedure: TURBINATE REDUCTION/SUBMUCOSAL RESECTION;  Surgeon: Lesly Raspberry, MD;  Location: [facility];  Service: ENT;  Laterality: Bilateral;   REPLACEMENT TOTAL HIP W/  RESURFACING IMPLANTS Right 12/2017   [facility]   right shoulder surgery     11/28/20 for bone spur, torn labrum, re attached biceps tendon, tear Dr. Brunilda Capra   right shoulder surgery     12/22/21 total shoulder replacement [facility] Dr. Suszanne Eriksson CUFF REPAIR     Dr. Brunilda Capra in 04/2019    SCLEROTHERAPY     veins in lower legs   SEPTOPLASTY Bilateral 06/12/2019   Procedure: SEPTOPLASTY;  Surgeon: Lesly Raspberry, MD;  Location: [facility];  Service: ENT;  Laterality: Bilateral;   SHOULDER SURGERY Right 12/2015   [facility] torn rotator cuff   SPINAL FUSION  2016   [facility] lumbar   TOTAL SHOULDER ARTHROPLASTY Right 12/22/2021   Procedure: TOTAL SHOULDER ARTHROPLASTY;  Surgeon: Winston Hawking, MD;  Location: [facility];  Service: Orthopedics;  Laterality: Right;  with ISB    Family Psychiatric History: I have reviewed family psychiatric history from progress note on 04/30/2018.  Family History:  Family History  Problem Relation Age of Onset   Breast cancer Maternal Aunt    Breast cancer Paternal Aunt    Dementia Paternal Aunt    COPD Mother        quit smoking after 60 years   Arthritis Mother    Osteoarthritis Mother    Dementia Father        symptoms started in his 24s   Alzheimer's disease Father    Rheum arthritis Father    Rheum arthritis Other        FH   Dementia Other        all my dad's side of the family   Other Maternal Grandmother        got dementia as she got older    Dementia Paternal Grandmother     Social History: I have reviewed social history from progress note on 04/30/2018. Social History   Socioeconomic History   Marital status: Married    Spouse name: fredrick   Number of children: 1   Years of education: Not on file   Highest education level: 12th grade  Occupational History    Comment: full time  Tobacco Use   Smoking status: Never   Smokeless tobacco: Never  Vaping Use   Vaping status: Never Used  Substance and Sexual Activity   Alcohol use: Not Currently    Alcohol/week: 1.0 standard drink of alcohol    Types: 1 Cans of beer per week    Comment: may have drink 1x/month   Drug use: Never   Sexual activity: Yes  Other Topics Concern   Not on file  Social History Narrative   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    Married 1 child    Lives at home with her husband   Right handed   Caffeine: maybe 1 cup/day (pepsi)   Social Drivers of Corporate investment banker Strain: Low Risk  (12/09/2023)   Overall Financial Resource Strain (CARDIA)    Difficulty of Paying Living Expenses: Not hard at all  Recent Concern: Financial Resource Strain - High Risk (09/19/2023)   Received from [facility]    Overall Financial Resource Strain (CARDIA)    Difficulty of Paying Living Expenses: Very hard  Food Insecurity: Food  Insecurity Present (12/09/2023)   Hunger Vital Sign    Worried About Running Out of Food in the Last Year: Sometimes true    Ran Out of Food in the Last Year: Sometimes true  Transportation Needs: No Transportation Needs (12/09/2023)   PRAPARE - Administrator, Civil Service (Medical): No    Lack of Transportation (Non-Medical): No  Physical Activity: Inactive (12/09/2023)   Exercise Vital Sign    Days of Exercise per Week: 0 days    Minutes of Exercise per Session: 0 min  Stress: No Stress Concern Present (12/09/2023)   [employer] of Occupational Health - Occupational Stress Questionnaire    Feeling of Stress : Only a little  Recent Concern: Stress - Stress Concern Present (09/19/2023)   Received from [facility] of Occupational Health - Occupational Stress Questionnaire    Feeling of Stress : Rather much  Social Connections: Moderately Isolated (12/09/2023)   Social Connection and Isolation Panel    Frequency of Communication with Friends and Family: More than three times a week    Frequency of Social Gatherings with Friends and Family: More than three times a week    Attends Religious Services: Never    Database administrator or Organizations: No    Attends Banker Meetings: Never    Marital Status: Married    Allergies:  Allergies  Allergen Reactions   Amoxicillin Hives   Sulfamethoxazole-Trimethoprim Hives    Septra,   Rosuvastatin      Myalgia  Other Reaction(s): Myalgias   Sulfamethoxazole Hives   Trimethoprim Hives   Zanaflex [Tizanidine] Other (See Comments)    Weak muscles     Keflex [Cephalexin] Rash         Metabolic Disorder Labs: Lab Results  Component Value Date   HGBA1C 5.5 12/16/2023   MPG 111.15 12/14/2021   No results found for: PROLACTIN Lab Results  Component  Value Date   CHOL 243 (H) 11/06/2023   TRIG 83 11/06/2023   HDL 82 11/06/2023   CHOLHDL 3.0 11/06/2023   VLDL 82.9 05/17/2022   LDLCALC 147 (H) 11/06/2023   LDLCALC 129 (H) 11/28/2022   Lab Results  Component Value Date   TSH 3.04 11/05/2022   TSH 2.78 05/17/2022    Therapeutic Level Labs: No results found for: LITHIUM No results found for: VALPROATE No results found for: CBMZ  Current Medications: Current Outpatient Medications  Medication Sig Dispense Refill   aspirin 81 MG chewable tablet Chew 81 mg by mouth.     baclofen  (LIORESAL ) 10 MG tablet Take 1 tablet (10 mg total) by mouth 3 (three) times daily as needed for muscle spasms. 60 each 1   cetirizine (ZYRTEC) 10 MG tablet Take 10 mg by mouth daily as needed for allergies.     diclofenac  Sodium (VOLTAREN ) 1 % GEL Apply 2 g topically.     DULoxetine  (CYMBALTA ) 60 MG capsule TAKE 1 CAPSULE BY MOUTH DAILY 90 capsule 3   estradiol  (ESTRACE ) 0.1 MG/GM vaginal cream Place 1 Applicatorful vaginally once a week.     ezetimibe  (ZETIA ) 10 MG tablet Take 1 tablet (10 mg total) by mouth daily. 90 tablet 3   famotidine  (PEPCID ) 20 MG tablet Take 1 tablet (20 mg total) by mouth at bedtime. 30 tablet 11   fluticasone  (FLONASE ) 50 MCG/ACT nasal spray SHAKE LIQUID AND USE 1 TO 2 SPRAYS IN EACH NOSTRIL DAILY AS NEEDED. MAX DOSE 2 SPRAYS IN [facility]  NOSTRIL 16 g 11   hydrOXYzine  (ATARAX ) 10 MG tablet TAKE 1 TABLET BY MOUTH DAILY AS NEEDED FOR ANXIETY. 90 tablet 1   metFORMIN  (GLUCOPHAGE ) 500 MG tablet 1 tab daily with full meal 90 tablet 0   omeprazole  (PRILOSEC) 40 MG capsule Take 1 capsule (40 mg total) by mouth 2 (two) times daily. 60 capsule 3   ondansetron  (ZOFRAN ) 8 MG tablet Take by mouth.     ondansetron  (ZOFRAN -ODT) 4 MG disintegrating tablet Take 0.5 tablets (2 mg total) by mouth every 6 (six) hours as needed. 20 tablet 0   oxyCODONE -acetaminophen  (PERCOCET) 10-325 MG tablet Take 1 tablet by mouth in the morning, at noon, in the  evening, and at bedtime.     pregabalin  (LYRICA ) 100 MG capsule Take 100 mg by mouth 3 (three) times daily.     psyllium (METAMUCIL SMOOTH TEXTURE) 28 % packet Take 1 packet by mouth every other day.     rivaroxaban  (XARELTO ) 10 MG TABS tablet Take 10 mg by mouth in the morning. (1000)     saccharomyces boulardii (FLORASTOR) 250 MG capsule Take 1 capsule (250 mg total) by mouth daily. 90 capsule 0   Semaglutide -Weight Management (WEGOVY ) 0.5 MG/0.5ML SOAJ Inject 0.5 mg into the skin once a week. 2 mL 0   sucralfate  (CARAFATE ) 1 g tablet TAKE 1 TABLET BY MOUTH 4 TIMES A DAY 120 tablet 11   traZODone  (DESYREL ) 100 MG tablet Take 2 tablets (200 mg total) by mouth at bedtime as needed for sleep. 180 tablet 3   TURMERIC PO Take 2,000 mg by mouth in the morning. 1000 mg/capsule     valACYclovir  (VALTREX ) 500 MG tablet Take 500 mg by mouth 2 (two) times daily as needed (outbreaks).     No current facility-administered medications for this visit.     Musculoskeletal: Strength & Muscle Tone: UTA Gait & Station: Seated Patient leans: N/A  Psychiatric Specialty Exam: Review of Systems  Psychiatric/Behavioral:  Positive for sleep disturbance. The patient is nervous/anxious.     There were no vitals taken for this visit.There is no height or weight on file to calculate BMI.  General Appearance: Casual  Eye Contact:  Fair  Speech:  Clear and Coherent  Volume:  Normal  Mood:  Anxious  Affect:  Congruent  Thought Process:  Goal Directed and Descriptions of Associations: Intact  Orientation:  Full (Time, Place, and Person)  Thought Content: Logical   Suicidal Thoughts:  No  Homicidal Thoughts:  No  Memory:  Immediate;   Fair Recent;   Fair Remote;   Fair  Judgement:  Fair  Insight:  Fair  Psychomotor Activity:  Normal  Concentration:  Concentration: Fair and Attention Span: Fair  Recall:  [employer] of Knowledge: Fair  Language: Fair  Akathisia:  No  Handed:  Right  AIMS (if  indicated): not done  Assets:  Communication Skills Desire for Improvement Housing Social Support Transportation  ADL's:  Intact  Cognition: WNL  Sleep:  restless due to pain   Screenings: AIMS    Flowsheet Row Video Visit from 12[facility]26/2021 in [facility]  AIMS Total Score 0   GAD-7    Flowsheet Row Office Visit from 10/31/2023 in [facility] Office Visit from 02/20/2023 in [facility] Office Visit from 01/07/2023 in [facility] [employer] Visit from 08/16/2022 in [facility] Office Visit from 08/06/2022  in [facility] [employer]  Total GAD-7 Score 3 3 2 3 3    Mini-Mental    Flowsheet Row Office Visit from 09/23/2019 in [facility]  Total Score (max 30 points ) 27   PHQ2-9    Flowsheet Row Clinical Support from 12/09/2023 in [facility] Visit from 10/31/2023 in [facility] Office Visit from 02/20/2023 in [facility] Office Visit from 01/07/2023 in [facility] Visit from 08/16/2022 in [facility]  PHQ-2 Total Score 0 1 1 1 1   PHQ-9 Total Score 2 -- 3 -- 3   Flowsheet Row Video Visit from 12/19/2023 in [facility] Office Visit from 10/31/2023 in [facility] UC from 08/24/2023 in [facility]   C-SSRS RISK CATEGORY No Risk No Risk No Risk     Assessment and Plan: Maureen Randall is a 58 year old Caucasian female, has a history of generalized anxiety, insomnia, chronic pain was evaluated by telemedicine today.  Discussed  assessment and plan as noted below.  Generalized anxiety disorder-improving Current anxiety symptoms mostly related to her uncontrolled pain.  Not interested in changing medications or addition of other medications at this time. Interested in establishing care with the therapist, provided resources. Discontinue Topamax  for side effects Continue Cymbalta  60 mg daily Encouraged to use Hydroxyzine  10 mg daily as needed Provided information for Ms. Quirino Buckles, Tyrone Gallop for therapy.  She does have other resources available as well.  Insomnia-unstable Sleep problems are due to uncontrolled pain. Continue Trazodone  200 mg at bedtime Encouraged to use Hydroxyzine  10 mg as needed Will need sufficient pain management   Follow-up  Follow-up in clinic in 5 months or sooner if needed.  Collaboration of Care: Collaboration of Care: Referral or follow-up with counselor/therapist AEB encouraged to establish care with therapist.  Patient/Guardian was advised Release of Information must be obtained prior to any record release in order to collaborate their care with an outside provider. Patient/Guardian was advised if they have not already done so to contact the registration department to sign all necessary forms in order for us  to release information regarding their care.   Consent: Patient/Guardian gives verbal consent for treatment and assignment of benefits for services provided during this visit. Patient/Guardian expressed understanding and agreed to proceed.  This note was generated in part or whole with voice recognition software. Voice recognition is usually quite accurate but there are transcription errors that can and very often do occur. I apologize for any typographical errors that were not detected and corrected.     Azlee Monforte, MD 12/20/2023, 12:22 PM

## 2023-12-19 NOTE — Patient Instructions (Signed)
 MS. Maureen Randall [phone]

## 2023-12-25 ENCOUNTER — Telehealth: Payer: Self-pay

## 2023-12-25 NOTE — Progress Notes (Signed)
 Complex Care Management Note Care Guide Note  12/25/2023 Name: Maureen Randall MRN: [id number] DOB: 1966/01/28   Complex Care Management Outreach Attempts: An unsuccessful telephone outreach was attempted today to offer the patient information about available complex care management services.  Follow Up Plan:  Additional outreach attempts will be made to offer the patient complex care management information and services.   Encounter Outcome:  No Answer  [facility]  [facility], [facility] Care Management Assistant Direct Dial: [phone]  Fax: [phone]

## 2023-12-30 NOTE — Progress Notes (Signed)
 Complex Care Management Note Care Guide Note  12/30/2023 Name: Maureen Randall MRN: [id number] DOB: Jan 13, 1966   Complex Care Management Outreach Attempts: A second unsuccessful outreach was attempted today to offer the patient with information about available complex care management services.  Follow Up Plan:  Additional outreach attempts will be made to offer the patient complex care management information and services.   Encounter Outcome:  No Answer  [facility]  [facility] Care Management Assistant Direct Dial: [phone]  Fax: [phone]

## 2024-01-03 NOTE — Progress Notes (Signed)
 Complex Care Management Note Care Guide Note  01/03/2024 Name: Maureen Randall MRN: [id number] DOB: 10-04-1965   Complex Care Management Outreach Attempts: A third unsuccessful outreach was attempted today to offer the patient with information about available complex care management services.  Follow Up Plan:  No further outreach attempts will be made at this time. We have been unable to contact the patient to offer or enroll patient in complex care management services.  Encounter Outcome:  No Answer  [facility]  [facility] Care Management Assistant Direct Dial: [phone]  Fax: [phone]

## 2024-01-15 ENCOUNTER — Ambulatory Visit (INDEPENDENT_AMBULATORY_CARE_PROVIDER_SITE_OTHER): Admitting: Family Medicine

## 2024-01-22 ENCOUNTER — Encounter (INDEPENDENT_AMBULATORY_CARE_PROVIDER_SITE_OTHER): Payer: Self-pay | Admitting: Adult Health

## 2024-01-22 ENCOUNTER — Ambulatory Visit (INDEPENDENT_AMBULATORY_CARE_PROVIDER_SITE_OTHER): Admitting: Adult Health

## 2024-01-22 VITALS — BP 122/74 | HR 72 | Temp 99.0°F | Ht 66.0 in | Wt 174.0 lb

## 2024-01-22 DIAGNOSIS — E559 Vitamin D deficiency, unspecified: Secondary | ICD-10-CM | POA: Diagnosis not present

## 2024-01-22 DIAGNOSIS — R7303 Prediabetes: Secondary | ICD-10-CM

## 2024-01-22 DIAGNOSIS — E538 Deficiency of other specified B group vitamins: Secondary | ICD-10-CM | POA: Diagnosis not present

## 2024-01-22 DIAGNOSIS — E669 Obesity, unspecified: Secondary | ICD-10-CM

## 2024-01-22 DIAGNOSIS — Z6829 Body mass index (BMI) 29.0-29.9, adult: Secondary | ICD-10-CM

## 2024-01-22 DIAGNOSIS — Z6833 Body mass index (BMI) 33.0-33.9, adult: Secondary | ICD-10-CM

## 2024-01-22 MED ORDER — WEGOVY 0.5 MG/0.5ML ~~LOC~~ SOAJ: 0.5000 mg | SUBCUTANEOUS | 0 refills | Status: DC

## 2024-01-22 MED ORDER — CYANOCOBALAMIN 500 MCG PO TABS: 500.0000 ug | ORAL_TABLET | Freq: Every day | ORAL | 0 refills | Status: AC

## 2024-01-22 NOTE — Progress Notes (Signed)
 WEIGHT SUMMARY AND BIOMETRICS  Vitals Temp: 99 F (37.2 C) BP: 122/74 Pulse Rate: 72 SpO2: 94 %   Anthropometric Measurements Height: 5' 6 (1.676 m) Weight: 174 lb (78.9 kg) BMI (Calculated): 28.1 Weight at Last Visit: 181 lb Weight Lost Since Last Visit: 7 lb Weight Gained Since Last Visit: 0 Starting Weight: 207 lb Total Weight Loss (lbs): 33 lb (15 kg)   Body Composition  Body Fat %: 43.1 % Fat Mass (lbs): 75 lbs Muscle Mass (lbs): 94.2 lbs Total Body Water  (lbs): 67.2 lbs Visceral Fat Rating : 10   Other Clinical Data Fasting: yes Labs: no Today's Visit #: 13 Starting Date: 11/28/22    Chief Complaint:   OBESITY Seth is here to discuss her progress with her obesity treatment plan.  She is on the the Category 3 Plan and states she is following her eating plan approximately 50 % of the time.  She states she is exercising Walking 20 minutes 3 times per week.  Interim History:  She started Wegovy  0.25mg  on/about 11/04/2023  12/16/2023 Wegovy  0.25mg  increased to 0.5mg  Denies mass in neck, dysphagia, dyspepsia, persistent hoarseness, abdominal pain, or N/V/C   Reviewed Bioimpedance results with pt: Muscle Mass: -2.2 lbs Adipose Mass: -5 lbs  01/14/2024 INFERIOR VENACAVA FILTER REMOVAL.    Subjective:   1. Vitamin D  deficiency Discussed Labs   Latest Reference Range & Units 12/16/23 08:14  Vitamin D , 25-Hydroxy 30.0 - 100.0 ng/mL 76.4       Vit D Level is at goal She is not on any Vit D Supplementation  2. Prediabetes Discussed Labs   Latest Reference Range & Units 12/16/23 08:14  Hemoglobin A1C 4.8 - 5.6 % 5.5  Est. average glucose Bld gHb Est-mCnc mg/dL 888  INSULIN  2.6 - 24.9 uIU/mL 12.1   A1cat goal Insulin  level slightly above goal of 5 12/16/2023 Wegovy  0.25mg  increased to 0.5mg  Denies mass in neck, dysphagia, dyspepsia, persistent hoarseness, abdominal pain, or N/V/C  She is also on daily Metformin  500mg   3. B12  deficiency Discussed Labs   Latest Reference Range & Units 12/16/23 08:14  Vitamin B12 232 - 1,245 pg/mL 341   B12 level below goal of 500 She is not on any B12 supplementation  Assessment/Plan:   1. Vitamin D  deficiency (Primary) Monitor Labs  2. Prediabetes Continue daily Metformin  500mg  and weekly Wegovy  0.5mg   3. B12 deficiency Start cyanocobalamin  (VITAMIN B12) 500 MCG tablet Take 1 tablet (500 mcg total) by mouth daily. Dispense: 90 tablet, Refills: 0 ordered   4. Obesity (BMI 30-39.9), CURRENT BMI 29.3 Refill  Semaglutide -Weight Management (WEGOVY ) 0.5 MG/0.5ML SOAJ Inject 0.5 mg into the skin once a week. Dispense: 2 mL, Refills: 0 ordered   Maureen Randall is currently in the action stage of change. As such, her goal is to continue with weight loss efforts. She has agreed to the Category 3 Plan.   Exercise goals: All adults should avoid inactivity. Some physical activity is better than none, and adults who participate in any amount of physical activity gain some health benefits. Adults should also include muscle-strengthening activities that involve all major muscle groups on 2 or more days a week. Aquatic Exercise  Behavioral modification strategies: increasing lean protein intake, decreasing simple carbohydrates, increasing vegetables, increasing water  intake, decreasing eating out, no skipping meals, meal planning and cooking strategies, keeping healthy foods in the home, and planning for success.  Gila has agreed to follow-up with our clinic in 4 weeks. She was informed  of the importance of frequent follow-up visits to maximize her success with intensive lifestyle modifications for her multiple health conditions.   Objective:   Blood pressure 122/74, pulse 72, temperature 99 F (37.2 C), height 5' 6 (1.676 m), weight 174 lb (78.9 kg), SpO2 94%. Body mass index is 28.08 kg/m.  General: Cooperative, alert, well developed, in no acute distress. HEENT: Conjunctivae and lids  unremarkable. Cardiovascular: Regular rhythm.  Lungs: Normal work of breathing. Neurologic: No focal deficits.   Lab Results  Component Value Date   CREATININE 0.77 10/31/2023   BUN 18 10/31/2023   NA 143 10/31/2023   K 4.2 10/31/2023   CL 105 10/31/2023   CO2 25 10/31/2023   Lab Results  Component Value Date   ALT 19 10/31/2023   AST 19 10/31/2023   ALKPHOS 80 10/31/2023   BILITOT 0.3 10/31/2023   Lab Results  Component Value Date   HGBA1C 5.5 12/16/2023   HGBA1C 5.7 (H) 04/29/2023   HGBA1C 5.6 11/05/2022   HGBA1C 5.5 12/14/2021   HGBA1C 5.5 09/25/2019   Lab Results  Component Value Date   INSULIN  12.1 12/16/2023   INSULIN  8.2 04/29/2023   INSULIN  13.7 11/28/2022   Lab Results  Component Value Date   TSH 3.04 11/05/2022   Lab Results  Component Value Date   CHOL 243 (H) 11/06/2023   HDL 82 11/06/2023   LDLCALC 147 (H) 11/06/2023   TRIG 83 11/06/2023   CHOLHDL 3.0 11/06/2023   Lab Results  Component Value Date   VD25OH 76.4 12/16/2023   VD25OH 73.6 04/29/2023   VD25OH 59.4 11/28/2022   Lab Results  Component Value Date   WBC 7.2 11/16/2022   HGB 12.5 11/16/2022   HCT 38.3 11/16/2022   MCV 93.2 11/16/2022   PLT 338 11/16/2022   Lab Results  Component Value Date   IRON 77 09/25/2019   TIBC 336 09/25/2019   FERRITIN 37 09/25/2019   Attestation Statements:   Reviewed by clinician on day of visit: allergies, medications, problem list, medical history, surgical history, family history, social history, and previous encounter notes.  I have reviewed the above documentation for accuracy and completeness, and I agree with the above. -  Alijah Akram d. Reilley Latorre, NP-C

## 2024-01-27 ENCOUNTER — Telehealth (INDEPENDENT_AMBULATORY_CARE_PROVIDER_SITE_OTHER): Payer: Self-pay

## 2024-01-27 ENCOUNTER — Encounter (INDEPENDENT_AMBULATORY_CARE_PROVIDER_SITE_OTHER): Payer: Self-pay

## 2024-01-27 NOTE — Telephone Encounter (Signed)
 Prior Auth submitted for Wegovy . Awaiting determination.

## 2024-01-28 ENCOUNTER — Ambulatory Visit (INDEPENDENT_AMBULATORY_CARE_PROVIDER_SITE_OTHER): Admitting: Nurse Practitioner

## 2024-01-28 VITALS — BP 104/66 | HR 76 | Temp 97.9°F | Ht 66.0 in | Wt 179.6 lb

## 2024-01-28 DIAGNOSIS — M25572 Pain in left ankle and joints of left foot: Secondary | ICD-10-CM

## 2024-01-28 DIAGNOSIS — K219 Gastro-esophageal reflux disease without esophagitis: Secondary | ICD-10-CM

## 2024-01-28 DIAGNOSIS — M25571 Pain in right ankle and joints of right foot: Secondary | ICD-10-CM | POA: Diagnosis not present

## 2024-01-28 DIAGNOSIS — M545 Low back pain, unspecified: Secondary | ICD-10-CM | POA: Diagnosis not present

## 2024-01-28 DIAGNOSIS — E785 Hyperlipidemia, unspecified: Secondary | ICD-10-CM

## 2024-01-28 DIAGNOSIS — N390 Urinary tract infection, site not specified: Secondary | ICD-10-CM

## 2024-01-28 DIAGNOSIS — I82561 Chronic embolism and thrombosis of right calf muscular vein: Secondary | ICD-10-CM | POA: Diagnosis not present

## 2024-01-28 DIAGNOSIS — G8929 Other chronic pain: Secondary | ICD-10-CM

## 2024-01-28 DIAGNOSIS — F411 Generalized anxiety disorder: Secondary | ICD-10-CM

## 2024-01-28 DIAGNOSIS — R7303 Prediabetes: Secondary | ICD-10-CM

## 2024-01-28 MED ORDER — NITROFURANTOIN MONOHYD MACRO 100 MG PO CAPS: 100.0000 mg | ORAL_CAPSULE | Freq: Two times a day (BID) | ORAL | 0 refills | Status: DC | PRN

## 2024-01-28 NOTE — Progress Notes (Signed)
 Leron Glance, NP-C Phone: [phone]  Maureen Randall is a 58 y.o. female who presents today for transfer of care.   Discussed the use of AI scribe software for clinical note transcription with the patient, who gave verbal consent to proceed.  History of Present Illness   Maureen Randall is a 58 year old female who presents for transfer of care with ongoing foot pain and SI joint pain.  She has undergone three surgeries on her foot due to a ganglion cyst, with the most recent surgery in December. Despite these interventions, she continues to experience pain, particularly on the top of her foot, and has received injections for pain management. The last injection was effective, but she is awaiting MRI results for further evaluation. The pain, initially lower, now radiates to her ankle.  She has a history of back surgeries, with the most recent one in December. Prior to this surgery, she experienced sharp pains that caused her leg to buckle, leading to a fall down the stairs. Post-surgery, she no longer experiences the buckling or sharp pains, but now has significant SI joint pain, described as low tailbone pain with associated back knots and groin pain. She has received an SI joint injection which was not effective.  She is currently on Lyrica  for pain management and takes oxycodone  or Percocet as needed. For weight management, she is on metformin  and Wegovy , and she also takes B12 and vitamin D  supplements. She has a history of chronic DVT and is on Rivaroxaban  (Xarelto ) for anticoagulation. She takes Zetia  for cholesterol management due to intolerance to statins.  She has a history of acid reflux and gastritis, managed with omeprazole , Pepcid , and Carafate . No trouble swallowing or blood in her stool. She is transitioning to a new gastroenterologist at [facility] after her previous doctor left.  She has a history of recurrent UTIs and previously took macrodantin  as a preventive measure. She  is not currently experiencing symptoms but requests a refill for preventive use when she feels a UTI coming on.  She is under psychiatric care for anxiety and is on Cymbalta  and trazodone . She also uses a vaginal cream prescribed for menopausal symptoms.      Social History   Tobacco Use  Smoking Status Never  Smokeless Tobacco Never    Current Outpatient Medications on File Prior to Visit  Medication Sig Dispense Refill   baclofen  (LIORESAL ) 10 MG tablet Take 1 tablet (10 mg total) by mouth 3 (three) times daily as needed for muscle spasms. 60 each 1   cetirizine (ZYRTEC) 10 MG tablet Take 10 mg by mouth daily as needed for allergies.     cyanocobalamin  (VITAMIN B12) 500 MCG tablet Take 1 tablet (500 mcg total) by mouth daily. 90 tablet 0   diclofenac  Sodium (VOLTAREN ) 1 % GEL Apply 2 g topically.     DULoxetine  (CYMBALTA ) 60 MG capsule TAKE 1 CAPSULE BY MOUTH DAILY 90 capsule 3   estradiol  (ESTRACE ) 0.1 MG/GM vaginal cream Place 1 Applicatorful vaginally once a week.     ezetimibe  (ZETIA ) 10 MG tablet Take 10 mg by mouth.     famotidine  (PEPCID ) 20 MG tablet Take 1 tablet (20 mg total) by mouth at bedtime. 30 tablet 11   fluticasone  (FLONASE ) 50 MCG/ACT nasal spray SHAKE LIQUID AND USE 1 TO 2 SPRAYS IN EACH NOSTRIL DAILY AS NEEDED. MAX DOSE 2 SPRAYS IN EACH NOSTRIL 16 g 11   metFORMIN  (GLUCOPHAGE ) 500 MG tablet 1 tab daily with full  meal 90 tablet 0   omeprazole  (PRILOSEC) 40 MG capsule Take 1 capsule (40 mg total) by mouth 2 (two) times daily. 60 capsule 3   ondansetron  (ZOFRAN -ODT) 4 MG disintegrating tablet Take 0.5 tablets (2 mg total) by mouth every 6 (six) hours as needed. 20 tablet 0   oxyCODONE -acetaminophen  (PERCOCET) 10-325 MG tablet Take 1 tablet by mouth in the morning, at noon, in the evening, and at bedtime.     pregabalin  (LYRICA ) 100 MG capsule Take 100 mg by mouth 3 (three) times daily.     psyllium (METAMUCIL SMOOTH TEXTURE) 28 % packet Take 1 packet by mouth every  other day.     rivaroxaban  (XARELTO ) 10 MG TABS tablet Take 10 mg by mouth in the morning. (1000)     saccharomyces boulardii (FLORASTOR) 250 MG capsule Take 1 capsule (250 mg total) by mouth daily. 90 capsule 0   Semaglutide -Weight Management (WEGOVY ) 0.5 MG/0.5ML SOAJ Inject 0.5 mg into the skin once a week. 2 mL 0   sucralfate  (CARAFATE ) 1 g tablet TAKE 1 TABLET BY MOUTH 4 TIMES A DAY 120 tablet 11   traZODone  (DESYREL ) 100 MG tablet Take 2 tablets (200 mg total) by mouth at bedtime as needed for sleep. 180 tablet 3   TURMERIC PO Take 2,000 mg by mouth in the morning. 1000 mg/capsule     valACYclovir  (VALTREX ) 500 MG tablet Take 500 mg by mouth 2 (two) times daily as needed (outbreaks).     No current facility-administered medications on file prior to visit.     ROS see history of present illness  Objective  Physical Exam Vitals:   01/28/24 0908  BP: 104/66  Pulse: 76  Temp: 97.9 F (36.6 C)  SpO2: 95%    BP Readings from Last 3 Encounters:  01/28/24 104/66  01/22/24 122/74  12/16/23 118/74   Wt Readings from Last 3 Encounters:  01/28/24 179 lb 9.6 oz (81.5 kg)  01/22/24 174 lb (78.9 kg)  12/16/23 181 lb (82.1 kg)    Physical Exam Constitutional:      General: She is not in acute distress.    Appearance: Normal appearance.  HENT:     Head: Normocephalic.  Cardiovascular:     Rate and Rhythm: Normal rate and regular rhythm.     Heart sounds: Normal heart sounds.  Pulmonary:     Effort: Pulmonary effort is normal.     Breath sounds: Normal breath sounds.  Skin:    General: Skin is warm and dry.  Neurological:     General: No focal deficit present.     Mental Status: She is alert.  Psychiatric:        Mood and Affect: Mood normal.        Behavior: Behavior normal.      Assessment/Plan: Please see individual problem list.  Chronic bilateral low back pain, unspecified whether sciatica present Assessment & Plan: Chronic low back pain at the sacroiliac  joint, possibly due to spinal fusion. Previous injection was ineffective. Proceed with planned SI joint injection with the surgeon's doctor. Continue Lyrica  for pain management and follow-up with a pain management specialist.   Pain in joints of both feet Assessment & Plan: She experiences chronic bilateral foot pain with a history of surgeries and scar tissue, possibly related to a fall. Awaiting MRI results. Review MRI results with an orthopedic specialist and continue follow-up for management.   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 Continue Rivaroxaban  (Xarelto ) and follow-up with a vascular specialist.   Gastroesophageal reflux disease without esophagitis Assessment & Plan: Chronic gastritis, likely from NSAID use, is managed with omeprazole , Pepcid , and Carafate . Continue these medications and follow-up with a new gastroenterologist in September.   Hyperlipidemia, unspecified hyperlipidemia type Assessment & Plan: Persistent hyperlipidemia is managed with Zetia  due to statin intolerance. Continue Zetia  for cholesterol management.   Prediabetes Assessment & Plan: She is on metformin  and Wegovy  for prediabetes and weight loss. Continue current medication regimen. Follow-up with [facility]. Encourage healthy diet and regular exercise.    Recurrent UTI Assessment & Plan: Recurrent UTIs are managed with Macrobid  preventively. Continue macrobid  100 mg twice daily as needed.  Orders: -     Nitrofurantoin  Monohyd Macro; Take 1 capsule (100 mg total) by mouth 2 (two) times daily as needed (recurrent UTI).  Dispense: 30 capsule; Refill: 0  Generalized anxiety disorder Assessment & Plan: Anxiety is managed with Cymbalta  and trazodone . Continue these medications and follow-up with psychiatry.      Return in about 6 months (around 07/30/2024) for  Follow up.   Leron Glance, NP-C [facility] [place]

## 2024-01-28 NOTE — Telephone Encounter (Signed)
 PA for Wegovy  denied, Appeal started 01/28/24

## 2024-01-28 NOTE — Telephone Encounter (Signed)
 Message from Plan: Appeal denied  This drug is not on your Drug Guide (formulary). 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 This is a Part D drug review. Part D drugs must meet medical standards for use. These are called reasonable and necessary standards. They are based on who is likely to have more benefit than risk by using a drug. Right now, you don't meet the standard(s) for use of this drug. The standard(s) to be met for this use is/are: You must have established cardiovascular disease (for example: previous myocardial infarction, previous stroke, or symptomatic peripheral arterial disease) Your prescriber may review these. They may then send us  more information. We can then take another look at your case. These medical standards may be found in one of Medicare's two Part D non-cancer compendia (drug guides). They may also be found within the studies supporting the FDA approval, or the compendia citations. Learn more in your Evidence of Coverage booklet. Look under 'What types of drugs are not covered by the plan'. This drug is not on your Drug Guide (formulary). Your prescriber has chosen this drug to treat your: Cardiovascular and Mediastinum Varicose veins of both lower extremities Acute deep vein thrombosis (DVT) of femoral vein of left lower extremity (HCC) Chronic deep vein thrombosis (DVT) of both lower extremities (HCC) Chronic deep vein thrombosis (DVT) of calf muscle vein of right lower extremity, Menopausal flushing, Other hyperlipidemia, Heterozygous factor V Leiden mutation, obesity and cv risk reduction. This is a Part D drug review. Part D drugs must meet medical  standards for use. These are called reasonable and necessary standards. They are based on who is likely to have more benefit than risk by using a drug. Right now, you don't meet the standard(s) for use of this drug. The standard(s) to be met for this use is/are: You must have established cardiovascular disease (e.g.- previous myocardial infarction, previous stroke, or symptomatic peripheral arterial disease Your prescriber may review these. They may then send us  more information. We can then take another look at your case. These medical standards may be found in one of Medicare's two Part D non-cancer compendia (drug guides). They may also be found within the studies supporting the FDA approval, or the compendia citations. Learn more in your Evidence of Coverage booklet. Look under 'What types of drugs are not covered by the plan'.

## 2024-02-05 ENCOUNTER — Other Ambulatory Visit: Payer: Self-pay

## 2024-02-05 ENCOUNTER — Other Ambulatory Visit: Payer: Self-pay | Admitting: Orthopaedic Surgery

## 2024-02-05 ENCOUNTER — Ambulatory Visit
Admission: RE | Admit: 2024-02-05 | Discharge: 2024-02-05 | Disposition: A | Discharge status: Home / Self Care | Source: Ambulatory Visit | Attending: Orthopaedic Surgery | Admitting: Orthopaedic Surgery

## 2024-02-05 DIAGNOSIS — G8929 Other chronic pain: Secondary | ICD-10-CM

## 2024-02-10 ENCOUNTER — Encounter: Payer: Self-pay | Admitting: Nurse Practitioner

## 2024-02-10 NOTE — Assessment & Plan Note (Signed)
 Anxiety is managed with Cymbalta  and trazodone . Continue these medications and follow-up with psychiatry.

## 2024-02-10 NOTE — Assessment & Plan Note (Signed)
 Chronic DVT is managed with Rivaroxaban . Continue Rivaroxaban  (Xarelto ) and follow-up with a vascular specialist.

## 2024-02-10 NOTE — Assessment & Plan Note (Signed)
 She experiences chronic bilateral foot pain with a history of surgeries and scar tissue, possibly related to a fall. Awaiting MRI results. Review MRI results with an orthopedic specialist and continue follow-up for management.

## 2024-02-10 NOTE — Assessment & Plan Note (Signed)
 She is on metformin  and Wegovy  for prediabetes and weight loss. Continue current medication regimen. Follow-up with [facility]. Encourage healthy diet and regular exercise.

## 2024-02-10 NOTE — Assessment & Plan Note (Signed)
 Chronic gastritis, likely from NSAID use, is managed with omeprazole , Pepcid , and Carafate . Continue these medications and follow-up with a new gastroenterologist in September.

## 2024-02-10 NOTE — Assessment & Plan Note (Signed)
 Recurrent UTIs are managed with Macrobid  preventively. Continue macrobid  100 mg twice daily as needed.

## 2024-02-10 NOTE — Assessment & Plan Note (Signed)
 Persistent hyperlipidemia is managed with Zetia  due to statin intolerance. Continue Zetia  for cholesterol management.

## 2024-02-10 NOTE — Assessment & Plan Note (Signed)
 Chronic low back pain at the sacroiliac joint, possibly due to spinal fusion. Previous injection was ineffective. Proceed with planned SI joint injection with the surgeon's doctor. Continue Lyrica  for pain management and follow-up with a pain management specialist.

## 2024-02-11 ENCOUNTER — Ambulatory Visit (INDEPENDENT_AMBULATORY_CARE_PROVIDER_SITE_OTHER): Admitting: Adult Health

## 2024-02-11 ENCOUNTER — Encounter (INDEPENDENT_AMBULATORY_CARE_PROVIDER_SITE_OTHER): Payer: Self-pay | Admitting: Adult Health

## 2024-02-11 VITALS — BP 121/78 | HR 75 | Temp 98.3°F | Ht 66.0 in | Wt 173.0 lb

## 2024-02-11 DIAGNOSIS — E66811 Obesity, class 1: Secondary | ICD-10-CM

## 2024-02-11 DIAGNOSIS — R7303 Prediabetes: Secondary | ICD-10-CM | POA: Diagnosis not present

## 2024-02-11 DIAGNOSIS — E538 Deficiency of other specified B group vitamins: Secondary | ICD-10-CM | POA: Diagnosis not present

## 2024-02-11 DIAGNOSIS — R632 Polyphagia: Secondary | ICD-10-CM

## 2024-02-11 DIAGNOSIS — Z6828 Body mass index (BMI) 28.0-28.9, adult: Secondary | ICD-10-CM

## 2024-02-11 DIAGNOSIS — E669 Obesity, unspecified: Secondary | ICD-10-CM | POA: Diagnosis not present

## 2024-02-11 MED ORDER — WEGOVY 1 MG/0.5ML ~~LOC~~ SOAJ: 1.0000 mg | SUBCUTANEOUS | 0 refills | Status: DC

## 2024-02-11 MED ORDER — METFORMIN HCL 500 MG PO TABS: ORAL_TABLET | ORAL | 0 refills | Status: DC

## 2024-02-11 NOTE — Progress Notes (Signed)
 WEIGHT SUMMARY AND BIOMETRICS  Vitals Temp: 98.3 F (36.8 C) BP: 121/78 Pulse Rate: 75 SpO2: 98 %   Anthropometric Measurements Height: 5' 6 (1.676 m) Weight: 173 lb (78.5 kg) BMI (Calculated): 27.94 Weight at Last Visit: 174 lb Weight Lost Since Last Visit: 1 lb Weight Gained Since Last Visit: 0 Starting Weight: 207 lb Total Weight Loss (lbs): 34 lb (15.4 kg) Peak Weight: 216 lb   Body Composition  Body Fat %: 44.5 % Fat Mass (lbs): 77.4 lbs Muscle Mass (lbs): 91.4 lbs Total Body Water  (lbs): 70.4 lbs Visceral Fat Rating : 10   Other Clinical Data Fasting: yes Labs: no Today's Visit #: 14 Starting Date: 11/28/22 Comments: cat 3    Chief Complaint:   OBESITY Maureen Randall is here to discuss her progress with her obesity treatment plan.  She is on the the Category 3 Plan and states she is following her eating plan approximately 50 % of the time.  She states she is exercising: None.  Interim History:  Initial Goal Weight 175 lbs Today's weight 173 lbs New Goal Weight 165 lbs  She started Wegovy  0.25mg  on/about 11/04/2023  12/16/2023 Wegovy  0.25mg  increased to 0.5mg   She reports increased cravings and subsequent snacking. She denies SE with weekly GLP-1 therapy. Discussed risks/benefits of increasing GLP-1 therapy  She is using savings card to obtain monthly GLP-1 therapy Cost $499/month  Of note- Post Menopausal  Subjective:   1. Polyphagia She started Wegovy  0.25mg  on/about 11/04/2023  12/16/2023 Wegovy  0.25mg  increased to 0.5mg   She reports increased cravings and subsequent snacking. She denies SE with weekly GLP-1 therapy. Discussed risks/benefits of increasing GLP-1 therapy  2. B12 deficiency She has started daily oral B12 500mcg supplementation the last week. She endorses stable energy levels  3. Prediabetes Lab Results  Component Value Date   HGBA1C 5.5 12/16/2023   HGBA1C 5.7 (H) 04/29/2023   HGBA1C 5.6 11/05/2022    She started  Wegovy  0.25mg  on/about 11/04/2023  12/16/2023 Wegovy  0.25mg  increased to 0.5mg   She reports increased cravings and subsequent snacking. She denies SE with weekly GLP-1 therapy. Discussed risks/benefits of increasing GLP-1 therapy  Of note- Post Menopausal  Assessment/Plan:   1. Polyphagia Refill and INCREASE Semaglutide -Weight Management (WEGOVY ) 1 MG/0.5ML SOAJ Inject 1 mg into the skin once a week. Dispense: 2 mL, Refills: 0 ordered   2. B12 deficiency Continue daily oral B12 500 mcg supplmentation  3. Prediabetes (Primary) Refill - metFORMIN  (GLUCOPHAGE ) 500 MG tablet; 1 tab daily with full meal  Dispense: 90 tablet; Refill: 0  4. Obesity (BMI 30-39.9), CURRENT BMI 28.1 Refill and INCREASE Semaglutide -Weight Management (WEGOVY ) 1 MG/0.5ML SOAJ Inject 1 mg into the skin once a week. Dispense: 2 mL, Refills: 0 ordered   Maureen Randall is currently in the action stage of change. As such, her goal is to continue with weight loss efforts. She has agreed to the Category 3 Plan.   Exercise goals: All adults should avoid inactivity. Some physical activity is better than none, and adults who participate in any amount of physical activity gain some health benefits. Adults should also include muscle-strengthening activities that involve all major muscle groups on 2 or more days a week.  Behavioral modification strategies: increasing lean protein intake, decreasing simple carbohydrates, increasing vegetables, increasing water  intake, no skipping meals, meal planning and cooking strategies, keeping healthy foods in the home, ways to avoid boredom eating, ways to avoid night time snacking, better snacking choices, and planning for success.  Britnee has  agreed to follow-up with our clinic in 4 weeks. She was informed of the importance of frequent follow-up visits to maximize her success with intensive lifestyle modifications for her multiple health conditions.   Check Fasting Labs late Summer  2025  Objective:   Blood pressure 121/78, pulse 75, temperature 98.3 F (36.8 C), height 5' 6 (1.676 m), weight 173 lb (78.5 kg), SpO2 98%. Body mass index is 27.92 kg/m.  General: Cooperative, alert, well developed, in no acute distress. HEENT: Conjunctivae and lids unremarkable. Cardiovascular: Regular rhythm.  Lungs: Normal work of breathing. Neurologic: No focal deficits.   Lab Results  Component Value Date   CREATININE 0.77 10/31/2023   BUN 18 10/31/2023   NA 143 10/31/2023   K 4.2 10/31/2023   CL 105 10/31/2023   CO2 25 10/31/2023   Lab Results  Component Value Date   ALT 19 10/31/2023   AST 19 10/31/2023   ALKPHOS 80 10/31/2023   BILITOT 0.3 10/31/2023   Lab Results  Component Value Date   HGBA1C 5.5 12/16/2023   HGBA1C 5.7 (H) 04/29/2023   HGBA1C 5.6 11/05/2022   HGBA1C 5.5 12/14/2021   HGBA1C 5.5 09/25/2019   Lab Results  Component Value Date   INSULIN  12.1 12/16/2023   INSULIN  8.2 04/29/2023   INSULIN  13.7 11/28/2022   Lab Results  Component Value Date   TSH 3.04 11/05/2022   Lab Results  Component Value Date   CHOL 243 (H) 11/06/2023   HDL 82 11/06/2023   LDLCALC 147 (H) 11/06/2023   TRIG 83 11/06/2023   CHOLHDL 3.0 11/06/2023   Lab Results  Component Value Date   VD25OH 76.4 12/16/2023   VD25OH 73.6 04/29/2023   VD25OH 59.4 11/28/2022   Lab Results  Component Value Date   WBC 7.2 11/16/2022   HGB 12.5 11/16/2022   HCT 38.3 11/16/2022   MCV 93.2 11/16/2022   PLT 338 11/16/2022   Lab Results  Component Value Date   IRON 77 09/25/2019   TIBC 336 09/25/2019   FERRITIN 37 09/25/2019   Attestation Statements:   Reviewed by clinician on day of visit: allergies, medications, problem list, medical history, surgical history, family history, social history, and previous encounter notes.  I have reviewed the above documentation for accuracy and completeness, and I agree with the above. -  Lodie Waheed d. Leovanni Bjorkman, NP-C

## 2024-02-12 ENCOUNTER — Telehealth (INDEPENDENT_AMBULATORY_CARE_PROVIDER_SITE_OTHER): Payer: Self-pay

## 2024-02-12 NOTE — Telephone Encounter (Signed)
 Prior Auth has been submitted for Wegovy  1 mg. Awaiting response.

## 2024-02-12 NOTE — Telephone Encounter (Signed)
 Approval of Prescription Drug Coverage Physician's Name: Rockie Dalton  Physician's Fax Number: [phone] Dear Maureen Randall: In response to you or your physician's or other prescriber's recent request, we have  approved coverage of WEGOVY  1 MG/0.5 ML PEN.  This authorization is good until 07/08/2024.  Found a note from [facility] that patient has a history of Peripheral Vascular Disease.

## 2024-02-27 ENCOUNTER — Encounter: Payer: Self-pay | Admitting: Gastroenterology

## 2024-02-27 NOTE — Anesthesia Preprocedure Evaluation (Addendum)
 Anesthesia Evaluation  Patient identified by MRN, date of birth, ID band Patient awake    Reviewed: Allergy & Precautions, H&P , NPO status , Patient's Chart, lab work & pertinent test results, reviewed documented beta blocker date and time   History of Anesthesia Complications (+) PONV and history of anesthetic complications  Airway Mallampati: II   Neck ROM: full    Dental  (+) Poor Dentition   Pulmonary asthma , pneumonia, COPD,  COPD inhaler   Pulmonary exam normal        Cardiovascular negative cardio ROS Normal cardiovascular exam Rhythm:regular Rate:Normal     Neuro/Psych  Headaches PSYCHIATRIC DISORDERS Anxiety Depression     Neuromuscular disease    GI/Hepatic Neg liver ROS, hiatal hernia,GERD  ,,  Endo/Other  negative endocrine ROS    Renal/GU negative Renal ROS  negative genitourinary   Musculoskeletal   Abdominal   Peds  Hematology negative hematology ROS (+)   Anesthesia Other Findings Past Medical History: No date: Abnormal glucose 07/26/2020: Adenoma of left adrenal gland 11/03/2020: Anterior to posterior tear of superior glenoid labrum of  right shoulder No date: Anxiety 08/31/2015: Arthralgia of multiple joints No date: Arthritis     Comment:  osteoarthritis-joints, feet, shoulder No date: Asthma     Comment:  previously diagnosed after a bout of bronchitis/ no               problems since 09/10/2019: Biceps tendinitis No date: Bulging of cervical intervertebral disc without myelopathy     Comment:  C6-C7 No date: Bursitis     Comment:  b/l hips No date: Chronic deep vein thrombosis (DVT) of both lower extremities  (HCC) No date: Chronic nausea No date: Chronic pain syndrome 08/06/2017: Closed fracture of metatarsal bone 2004: COPD (chronic obstructive pulmonary disease) (HCC)     Comment:  patient states probably bronchitis 05/24/2020: 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 after surgery, while on BCP 04/24/2021: Elevated blood-pressure reading, without diagnosis of  hypertension 07/27/2014: Facet arthropathy, lumbosacral 09/11/2021: Failed back surgical syndrome No date: Generalized osteoarthritis No date: GERD (gastroesophageal reflux disease) 09/10/2019: H/O nasal septoplasty 09/11/2021: Headache, post-myelogram 09/11/2021: Headache, post-myelogram No date: Hiatal hernia 01/29/2018: History of lumbar fusion 01/29/2018: History of right hip replacement (12/2017) No date: Hyperlipidemia 08/31/2015: Increased band cell count 06/07/2021: Infection of superficial incisional surgical site after  procedure 04/30/2018: IUD threads lost No date: Left leg DVT (HCC)     Comment:  fem/pop 12/07/20 after right shoulder surgery 11/30/20 No date: Low back pain, chronic 11/30/2020: Menopausal flushing 03/15/2021: Mood swings No date: Neuropathy     Comment:  post surgery No date: Obesity 07/04/2021: Open wound 06/07/2021: Open wound of left lower leg 08/17/2019: 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 No date: Polyphagia No date: PONV (postoperative nausea and vomiting) No date: Postoperative nausea and vomiting No date: Pre-diabetes 09/10/2019: Preoperative clearance No date: PVD (peripheral vascular disease) (HCC) No date: Recurrent falls 12/22/2021: S/P shoulder replacement, right No date: Sinusitis No date: Special screening for malignant neoplasms, colon No date: Spondylolisthesis 01/17/2017: Strain of foot 10/20/2018: Venous insufficiency of both lower extremities Past Surgical History: No date: ABLATION     Comment:  veins in lower legs 07/2014: BACK SURGERY  Comment:  lumbar spine/ fusion 1  level 07/08/2023: BACK SURGERY 2011: BREAST BIOPSY; Left     Comment:  neg 12/2015: CARPAL TUNNEL RELEASE; Right     Comment:  [facility] 12/2016: CARPAL TUNNEL RELEASE; Left 09/05/2022: CERVICAL DISC ARTHROPLASTY; N/A     Comment:  Procedure: CERVICAL SIX-CERVICAL SEVEN ARTIFICIAL DISC               REPLACEMENT;  Surgeon: Gillie Duncans, MD;  Location: [facility]               OR;  Service: Neurosurgery;  Laterality: N/A;  RM 18/3C 02/29/2020: COLONOSCOPY WITH PROPOFOL ; N/A     Comment:  Procedure: COLONOSCOPY WITH PROPOFOL ;  Surgeon: Jinny Carmine, MD;  Location: [facility];  Service:               Endoscopy;  Laterality: N/A; 02/29/2020: ESOPHAGOGASTRODUODENOSCOPY (EGD) WITH PROPOFOL ; N/A     Comment:  Procedure: ESOPHAGOGASTRODUODENOSCOPY (EGD) WITH               PROPOFOL ;  Surgeon: Jinny Carmine, MD;  Location: [facility];  Service: Endoscopy;  Laterality: N/A; 2015: FOOT SURGERY; Right     Comment:  broken navicular bone 05/2011: HIP ARTHROSCOPY; Right No date: HIP SURGERY     Comment:  left hip surgery 05/2018 11/22/2022: HYSTEROSCOPY; N/A     Comment:  Procedure: HYSTEROSCOPY, IUD REMOVAL;  Surgeon: Verdon Keen, MD;  Location: [facility] ORS;  Service: Gynecology;                Laterality: N/A; 12/2017 & 05/2018: JOINT REPLACEMENT; Bilateral     Comment:  hip 09/28/2019: L5-S1 OLIF Dr. Royden Schneider back surgery 09/2019 No date: left ganglion cyst removal     Comment:  12/23/19 Dr. Lilli 12/17/2019: MASS EXCISION; Left     Comment:  Procedure: EXCISION TUMOR FOOT DEEP LEFT;  Surgeon:               Lilli Cough, DPM;  Location: [facility];                Service: Podiatry;  Laterality: Left;  LMA W/LOCAL 06/12/2019: NASAL TURBINATE REDUCTION; Bilateral     Comment:  Procedure: TURBINATE REDUCTION/SUBMUCOSAL RESECTION;                Surgeon: Herminio Miu, MD;  Location: [facility]               [facility];   Service: ENT;  Laterality: Bilateral; 12/2017: REPLACEMENT TOTAL HIP W/  RESURFACING IMPLANTS; Right     Comment:  [facility] No date: right shoulder surgery     Comment:  11/28/20 for bone spur, torn labrum, re attached biceps               tendon, tear Dr. Kay No date: right shoulder surgery     Comment:  12/22/21 total shoulder replacement [facility]               Dr. Kay No date: ROTATOR CUFF REPAIR     Comment:  Dr. Kay in 04/2019  No date: SCLEROTHERAPY     Comment:  veins in lower legs 06/12/2019: SEPTOPLASTY; Bilateral     Comment:  Procedure: SEPTOPLASTY;  Surgeon: Herminio Miu, MD;               Location: [facility];  Service: ENT;                Laterality: Bilateral; 12/2015: SHOULDER SURGERY; Right     Comment:  [facility] torn rotator cuff 2016: SPINAL FUSION     Comment:  [facility] lumbar 12/22/2021: TOTAL SHOULDER ARTHROPLASTY; Right     Comment:  Procedure: TOTAL SHOULDER ARTHROPLASTY;  Surgeon:               Kay Kemps, MD;  Location: [facility] ORS;  Service:               Orthopedics;  Laterality: Right;  with ISB BMI    Body Mass Index: 27.24 kg/m     Reproductive/Obstetrics negative OB ROS                              Anesthesia Physical Anesthesia Plan  ASA: 3  Anesthesia Plan: General   Post-op Pain Management:    Induction:   PONV Risk Score and Plan:   Airway Management Planned:   Additional Equipment:   Intra-op Plan:   Post-operative Plan:   Informed Consent: I have reviewed the patients History and Physical, chart, labs and discussed the procedure including the risks, benefits and alternatives for the proposed anesthesia with the patient or authorized representative who has indicated his/her understanding and acceptance.     Dental Advisory Given  Plan Discussed with: CRNA  Anesthesia Plan Comments:         Anesthesia Quick Evaluation

## 2024-02-28 ENCOUNTER — Other Ambulatory Visit: Payer: Self-pay | Admitting: Gastroenterology

## 2024-03-03 ENCOUNTER — Encounter: Payer: Self-pay | Admitting: Gastroenterology

## 2024-03-03 ENCOUNTER — Ambulatory Visit: Payer: Self-pay | Admitting: Anesthesiology

## 2024-03-03 ENCOUNTER — Other Ambulatory Visit: Payer: Self-pay

## 2024-03-03 ENCOUNTER — Encounter
Admission: RE | Disposition: A | Discharge status: Home / Self Care | Payer: Self-pay | Source: Home / Self Care | Attending: Gastroenterology

## 2024-03-03 ENCOUNTER — Ambulatory Visit
Admission: RE | Admit: 2024-03-03 | Discharge: 2024-03-03 | Disposition: A | Discharge status: Home / Self Care | Attending: Gastroenterology | Admitting: Gastroenterology

## 2024-03-03 DIAGNOSIS — Z86718 Personal history of other venous thrombosis and embolism: Secondary | ICD-10-CM | POA: Diagnosis not present

## 2024-03-03 DIAGNOSIS — R110 Nausea: Secondary | ICD-10-CM | POA: Diagnosis present

## 2024-03-03 DIAGNOSIS — F32A Depression, unspecified: Secondary | ICD-10-CM | POA: Diagnosis not present

## 2024-03-03 DIAGNOSIS — K2960 Other gastritis without bleeding: Secondary | ICD-10-CM | POA: Diagnosis not present

## 2024-03-03 DIAGNOSIS — R1013 Epigastric pain: Secondary | ICD-10-CM | POA: Diagnosis not present

## 2024-03-03 DIAGNOSIS — J449 Chronic obstructive pulmonary disease, unspecified: Secondary | ICD-10-CM | POA: Diagnosis not present

## 2024-03-03 DIAGNOSIS — K5909 Other constipation: Secondary | ICD-10-CM | POA: Diagnosis present

## 2024-03-03 DIAGNOSIS — K219 Gastro-esophageal reflux disease without esophagitis: Secondary | ICD-10-CM | POA: Insufficient documentation

## 2024-03-03 DIAGNOSIS — Z79899 Other long term (current) drug therapy: Secondary | ICD-10-CM | POA: Insufficient documentation

## 2024-03-03 DIAGNOSIS — Z7984 Long term (current) use of oral hypoglycemic drugs: Secondary | ICD-10-CM | POA: Diagnosis not present

## 2024-03-03 DIAGNOSIS — R519 Headache, unspecified: Secondary | ICD-10-CM | POA: Insufficient documentation

## 2024-03-03 DIAGNOSIS — R140 Abdominal distension (gaseous): Secondary | ICD-10-CM | POA: Diagnosis present

## 2024-03-03 DIAGNOSIS — F419 Anxiety disorder, unspecified: Secondary | ICD-10-CM | POA: Diagnosis not present

## 2024-03-03 DIAGNOSIS — K449 Diaphragmatic hernia without obstruction or gangrene: Secondary | ICD-10-CM | POA: Insufficient documentation

## 2024-03-03 HISTORY — DX: Prediabetes: R73.03

## 2024-03-03 HISTORY — DX: Nausea: R11.0

## 2024-03-03 HISTORY — PX: ESOPHAGOGASTRODUODENOSCOPY: SHX5428

## 2024-03-03 HISTORY — DX: Polyphagia: R63.2

## 2024-03-03 SURGERY — EGD (ESOPHAGOGASTRODUODENOSCOPY)
Anesthesia: Choice | Site: Mouth

## 2024-03-03 MED ORDER — LIDOCAINE HCL (CARDIAC) PF 100 MG/5ML IV SOSY
PREFILLED_SYRINGE | INTRAVENOUS | Status: DC | PRN
  Administered 2024-03-03: 100 mg via INTRAVENOUS

## 2024-03-03 MED ORDER — PROPOFOL 10 MG/ML IV BOLUS
INTRAVENOUS | Status: DC | PRN
  Administered 2024-03-03: 25 mg via INTRAVENOUS
  Administered 2024-03-03: 50 mg via INTRAVENOUS
  Administered 2024-03-03: 100 mg via INTRAVENOUS

## 2024-03-03 MED ORDER — LACTATED RINGERS IV SOLN: INTRAVENOUS | Status: DC

## 2024-03-03 MED ORDER — STERILE WATER FOR IRRIGATION IR SOLN
Status: DC | PRN
Start: 2024-03-03 — End: 2024-03-03
  Administered 2024-03-03: 500 mL

## 2024-03-03 SURGICAL SUPPLY — 18 items
BALLN DILATOR ESOPH 8 10 CRE (MISCELLANEOUS) IMPLANT
BALLOON DILATOR 12-15 8 (BALLOONS) IMPLANT
BALLOON DILATOR 15-18 8 (BALLOONS) IMPLANT
BALLOON DILATOR CRE 0-12 8 (BALLOONS) IMPLANT
BLOCK BITE 60FR ADLT L/F GRN (MISCELLANEOUS) ×1 IMPLANT
CLIP HMST 235XBRD CATH ROT (MISCELLANEOUS) IMPLANT
ELECTRODE REM PT RTRN 9FT ADLT (ELECTROSURGICAL) IMPLANT
FORCEPS BIOP RAD 4 LRG CAP 4 (CUTTING FORCEPS) IMPLANT
GOWN CVR UNV OPN BCK APRN NK (MISCELLANEOUS) ×2 IMPLANT
INJECTOR VARIJECT VIN23 (MISCELLANEOUS) IMPLANT
KIT DEFENDO VALVE AND CONN (KITS) IMPLANT
KIT PRC NS LF DISP ENDO (KITS) ×1 IMPLANT
MANIFOLD NEPTUNE II (INSTRUMENTS) ×1 IMPLANT
MARKER SPOT ENDO TATTOO 5ML (MISCELLANEOUS) IMPLANT
RETRIEVER NET PLAT FOOD (MISCELLANEOUS) IMPLANT
SYR INFLATION 60ML (SYRINGE) IMPLANT
WATER STERILE IRR 250ML POUR (IV SOLUTION) ×1 IMPLANT
WIRE CRE 18-20MM 8CM F G (MISCELLANEOUS) IMPLANT

## 2024-03-03 NOTE — H&P (Signed)
 Maureen JONELLE Brooklyn, MD [facility], DHIP [address]  [place], [place] [address]  Main: [phone] Fax:  [phone] Pager: [phone]   Primary Care Physician:  Gretel App, NP Primary Gastroenterologist:  Dr. Corinn JONELLE Randall  Pre-Procedure History & Physical: HPI:  Maureen Randall is a 58 y.o. female is here for an endoscopy.   Past Medical History:  Diagnosis Date   Abnormal glucose    Adenoma of left adrenal gland 07/26/2020   Anterior to posterior tear of superior glenoid labrum of right shoulder 11/03/2020   Anxiety    Arthralgia of multiple joints 08/31/2015   Arthritis    osteoarthritis-joints, feet, shoulder   Asthma    previously diagnosed after a bout of bronchitis/ no problems since   Biceps tendinitis 09/10/2019   Bulging of cervical intervertebral disc without myelopathy    C6-C7   Bursitis    b/l hips   Chronic deep vein thrombosis (DVT) of both lower extremities (HCC)    Chronic nausea    Chronic pain syndrome    Closed fracture of metatarsal bone 08/06/2017   COPD (chronic obstructive pulmonary disease) (HCC) 2004   patient states probably bronchitis   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   Generalized osteoarthritis    GERD (gastroesophageal reflux disease)    H/O nasal septoplasty 09/10/2019   Headache, post-myelogram 09/11/2021   Headache, post-myelogram 09/11/2021   Hiatal hernia    History of lumbar fusion 01/29/2018   History of right hip replacement (12/2017) 01/29/2018   Hyperlipidemia    Increased band cell count 08/31/2015   Infection of superficial incisional surgical site after  procedure 06/07/2021   IUD threads lost 04/30/2018   Left leg DVT (HCC)    fem/pop 12/07/20 after right shoulder surgery 11/30/20   Low back pain, chronic    Menopausal flushing 11/30/2020   Mood swings 03/15/2021   Neuropathy    post surgery   Obesity    Open wound 07/04/2021   Open wound of left lower leg 06/07/2021   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 08/17/2019   Peroneal neuropathy, left    Pneumonia    11/2021 [facility]   Polyphagia    PONV (postoperative nausea and vomiting)    Postoperative nausea and vomiting    Pre-diabetes    Preoperative clearance 09/10/2019   PVD (peripheral vascular disease) (HCC)    Recurrent falls    S/P shoulder replacement, right 12/22/2021   Sinusitis    Special screening for malignant neoplasms, colon    Spondylolisthesis    Strain of foot 01/17/2017   Venous insufficiency of both lower extremities 10/20/2018    Past Surgical History:  Procedure Laterality Date   ABLATION     veins in lower legs   BACK SURGERY  07/2014   lumbar spine/ fusion 1 level   BACK SURGERY  07/08/2023   BREAST BIOPSY Left 2011   neg   CARPAL TUNNEL RELEASE Right 12/2015   [facility]   CARPAL TUNNEL RELEASE Left 12/2016   CERVICAL DISC ARTHROPLASTY N/A 09/05/2022   Procedure: CERVICAL SIX-CERVICAL SEVEN ARTIFICIAL DISC REPLACEMENT;  Surgeon: Gillie Duncans, MD;  Location: [facility] OR;  Service: Neurosurgery;  Laterality: N/A;  RM 18/3C   COLONOSCOPY WITH PROPOFOL  N/A 02/29/2020   Procedure: COLONOSCOPY WITH PROPOFOL ;  Surgeon: Jinny Carmine, MD;  Location: [facility];  Service: Endoscopy;  Laterality: N/A;   ESOPHAGOGASTRODUODENOSCOPY (EGD) WITH PROPOFOL  N/A 02/29/2020   Procedure: ESOPHAGOGASTRODUODENOSCOPY (EGD) WITH PROPOFOL ;  Surgeon: Jinny Carmine, MD;  Location: [facility];  Service: Endoscopy;  Laterality: N/A;   FOOT SURGERY Right 2015   broken navicular bone   HIP ARTHROSCOPY Right 05/2011   HIP SURGERY     left  hip surgery 05/2018   HYSTEROSCOPY N/A 11/22/2022   Procedure: HYSTEROSCOPY, IUD REMOVAL;  Surgeon: Verdon Keen, MD;  Location: [facility] ORS;  Service: Gynecology;  Laterality: N/A;   JOINT REPLACEMENT Bilateral 12/2017 & 05/2018   hip   L5-S1 OLIF Dr. Royden Schneider back surgery 09/2019  09/28/2019   left ganglion cyst removal     12/23/19 Dr. Lilli   MASS EXCISION Left 12/17/2019   Procedure: EXCISION TUMOR FOOT DEEP LEFT;  Surgeon: Lilli Cough, DPM;  Location: [facility];  Service: Podiatry;  Laterality: Left;  LMA W/LOCAL   NASAL TURBINATE REDUCTION Bilateral 06/12/2019   Procedure: TURBINATE REDUCTION/SUBMUCOSAL RESECTION;  Surgeon: Herminio Miu, MD;  Location: [facility];  Service: ENT;  Laterality: Bilateral;   REPLACEMENT TOTAL HIP W/  RESURFACING IMPLANTS Right 12/2017   [facility]   right shoulder surgery     11/28/20 for bone spur, torn labrum, re attached biceps tendon, tear Dr. Kay   right shoulder surgery     12/22/21 total shoulder replacement emerge [facility] Dr. Kay FRIED CUFF REPAIR     Dr. Kay in 04/2019    SCLEROTHERAPY     veins in lower legs   SEPTOPLASTY Bilateral 06/12/2019   Procedure: SEPTOPLASTY;  Surgeon: Herminio Miu, MD;  Location: [facility];  Service: ENT;  Laterality: Bilateral;   SHOULDER SURGERY Right 12/2015   [facility] torn rotator cuff   SPINAL FUSION  2016   [facility] lumbar   TOTAL SHOULDER ARTHROPLASTY Right 12/22/2021   Procedure: TOTAL SHOULDER ARTHROPLASTY;  Surgeon: Kay Kemps, MD;  Location: [facility];  Service: Orthopedics;  Laterality: Right;  with ISB    Prior to Admission medications   Medication Sig Start Date End Date Taking? Authorizing Provider  lubiprostone  (AMITIZA ) 24 MCG capsule Take 24 mcg by mouth daily with breakfast.   Yes [provider]  baclofen  (LIORESAL ) 10 MG tablet Take 1 tablet (10 mg total) by mouth 3 (three) times daily as needed for muscle  spasms. 12/22/21   Kay Kemps, MD  cetirizine (ZYRTEC) 10 MG tablet Take 10 mg by mouth daily as needed for allergies.    [provider]  cyanocobalamin  (VITAMIN B12) 500 MCG tablet Take 1 tablet (500 mcg total) by mouth daily. 01/22/24   Danford, Katy D, NP  diclofenac  Sodium (VOLTAREN ) 1 % GEL Apply 2 g topically. 05/05/19   [provider]  DULoxetine  (CYMBALTA ) 60 MG capsule TAKE 1 CAPSULE BY MOUTH DAILY 07/26/23   Eappen, Saramma, MD  estradiol  (ESTRACE ) 0.1 MG/GM vaginal cream Place 1 Applicatorful vaginally once a week. 02/05/17   [provider]  ezetimibe  (ZETIA ) 10 MG tablet Take 10 mg by mouth. 02/02/22   [provider]  famotidine  (PEPCID ) 20 MG tablet Take 1 tablet (20 mg total) by mouth at bedtime. 07/23/23   Hope Merle, MD  fluticasone  (FLONASE ) 50 MCG/ACT nasal spray  SHAKE LIQUID AND USE 1 TO 2 SPRAYS IN EACH NOSTRIL DAILY AS NEEDED. MAX DOSE 2 SPRAYS IN EACH NOSTRIL 09/20/22   Maribeth Camellia MATSU, MD  metFORMIN  (GLUCOPHAGE ) 500 MG tablet 1 tab daily with full meal 02/11/24   Danford, Rockie D, NP  nitrofurantoin , macrocrystal-monohydrate, (MACROBID ) 100 MG capsule Take 1 capsule (100 mg total) by mouth 2 (two) times daily as needed (recurrent UTI). 01/28/24   Gretel App, NP  omeprazole  (PRILOSEC) 40 MG capsule Take 1 capsule (40 mg total) by mouth 2 (two) times daily. 11/11/23   Jinny Carmine, MD  ondansetron  (ZOFRAN -ODT) 4 MG disintegrating tablet Take 0.5 tablets (2 mg total) by mouth every 6 (six) hours as needed. 11/12/23   Gretel App, NP  oxyCODONE -acetaminophen  (PERCOCET) 10-325 MG tablet Take 1 tablet by mouth in the morning, at noon, in the evening, and at bedtime. 06/16/19   [provider]  pregabalin  (LYRICA ) 100 MG capsule Take 100 mg by mouth 3 (three) times daily. 09/24/23   [provider]  rivaroxaban  (XARELTO ) 10 MG TABS tablet Take 10 mg by mouth in the morning. (1000)    [provider]  saccharomyces boulardii  (FLORASTOR) 250 MG capsule Take 1 capsule (250 mg total) by mouth daily. Patient not taking: Reported on 02/27/2024 08/06/22   Hope Merle, MD  Semaglutide -Weight Management (WEGOVY ) 1 MG/0.5ML SOAJ Inject 1 mg into the skin once a week. 02/11/24   Danford, Rockie D, NP  sucralfate  (CARAFATE ) 1 g tablet TAKE 1 TABLET BY MOUTH 4 TIMES A DAY 12/18/22   Therisa Bi, MD  traZODone  (DESYREL ) 100 MG tablet Take 2 tablets (200 mg total) by mouth at bedtime as needed for sleep. 10/17/23   Eappen, Saramma, MD  valACYclovir  (VALTREX ) 500 MG tablet Take 500 mg by mouth 2 (two) times daily as needed (outbreaks).    [provider]    Allergies as of 02/18/2024 - Review Complete 02/11/2024  Allergen Reaction Noted   Amoxicillin Hives 06/14/2018   Sulfamethoxazole-trimethoprim Hives 07/13/2013   Cephalexin Rash and Dermatitis 07/13/2013   Rosuvastatin  Other (See Comments) 12/15/2021   Sulfamethoxazole Hives 04/24/2021   Tizanidine Other (See Comments) 07/09/2019   Trimethoprim Hives 04/24/2021    Family History  Problem Relation Age of Onset   Breast cancer Maternal Aunt    Breast cancer Paternal Aunt    Dementia Paternal Aunt    COPD Mother        quit smoking after 60 years   Arthritis Mother    Osteoarthritis Mother    Dementia Father        symptoms started in his 90s   Alzheimer's disease Father    Rheum arthritis Father    Rheum arthritis Other        FH   Dementia Other        all my dad's side of the family   Other Maternal Grandmother        got dementia as she got older    Dementia Paternal Grandmother     Social History   Socioeconomic History   Marital status: Married    Spouse name: fredrick   Number of children: 1   Years of education: Not on file   Highest education level: 12th grade  Occupational History    Comment: full time  Tobacco Use   Smoking status: Never   Smokeless tobacco: Never  Vaping Use   Vaping status: Never Used  Substance and Sexual  Activity   Alcohol use:  Not Currently   Drug use: Never   Sexual activity: Yes  Other Topics Concern   Not on file  Social History Narrative   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    Married 1 child    Lives at home with her husband   Right handed   Caffeine: maybe 1 cup/day (pepsi)   Social Drivers of Health   Financial Resource Strain: Medium Risk (02/18/2024)   Received from [facility]   Overall Financial Resource Strain (CARDIA)    Difficulty of Paying Living Expenses: Somewhat hard  Food Insecurity: Food Insecurity Present (02/18/2024)   Received from [facility]   Hunger Vital Sign    Within the past 12 months, you worried that your food would run out before you got the money to buy more.: Sometimes true    Within the past 12 months, the food you bought just didn't last and you didn't have money to get more.: Sometimes true  Transportation Needs: No Transportation Needs (02/18/2024)   Received from [facility] - Transportation    In the past 12 months, has lack of transportation kept you from medical appointments or from getting medications?: No    Lack of Transportation (Non-Medical): No  Physical Activity: Inactive (12/09/2023)   Exercise Vital Sign    Days of Exercise per Week: 0 days    Minutes of Exercise per Session: 0 min  Stress: No Stress Concern Present (12/09/2023)   [employer] of Occupational Health - Occupational Stress Questionnaire    Feeling of Stress : Only a little  Recent Concern: Stress - Stress Concern Present (09/19/2023)   Received from [facility] of Occupational Health - Occupational Stress Questionnaire    Feeling of Stress : Rather much  Social Connections: Moderately Isolated (12/09/2023)   Social Connection and Isolation Panel    Frequency of Communication with Friends and Family: More than three times a week    Frequency of  Social Gatherings with Friends and Family: More than three times a week    Attends Religious Services: Never    Database administrator or Organizations: No    Attends Banker Meetings: Never    Marital Status: Married  Catering manager Violence: Not At Risk (12/09/2023)   Humiliation, Afraid, Rape, and Kick questionnaire    Fear of Current or Ex-Partner: No    Emotionally Abused: No    Physically Abused: No    Sexually Abused: No    Review of Systems: See HPI, otherwise negative ROS  Physical Exam: BP 104/77   Pulse 68   Temp 97.8 F (36.6 C) (Temporal)   Resp 15   Ht 5' 7 (1.702 m)   Wt 78.9 kg   SpO2 97%   BMI 27.24 kg/m  General:   Alert,  pleasant and cooperative in NAD Head:  Normocephalic and atraumatic. Neck:  Supple; no masses or thyromegaly. Lungs:  Clear throughout to auscultation.    Heart:  Regular rate and rhythm. Abdomen:  Soft, nontender and nondistended. Normal bowel sounds, without guarding, and without rebound.   Neurologic:  Alert and  oriented x4;  grossly normal neurologically.  Impression/Plan: Maureen Randall is here for an endoscopy to be performed for Chronic nausea   Risks, benefits, limitations, and alternatives regarding  endoscopy have been reviewed with the patient.  Questions have been answered.  All parties agreeable.   Veva Grimley,  MD  03/03/2024, 8:43 AM

## 2024-03-03 NOTE — Anesthesia Postprocedure Evaluation (Signed)
 Anesthesia Post Note  Patient: Maureen Randall  Procedure(s) Performed: EGD (ESOPHAGOGASTRODUODENOSCOPY) (Mouth)  Patient location during evaluation: PACU Anesthesia Type: General Level of consciousness: awake and alert Pain management: pain level controlled Vital Signs Assessment: post-procedure vital signs reviewed and stable Respiratory status: spontaneous breathing, nonlabored ventilation, respiratory function stable and patient connected to nasal cannula oxygen Cardiovascular status: blood pressure returned to baseline and stable Postop Assessment: no apparent nausea or vomiting Anesthetic complications: no   No notable events documented.   Last Vitals:  Vitals:   03/03/24 0915 03/03/24 0919  BP: 108/75 117/75  Pulse: 65 66  Resp: 16 12  Temp:  (!) 36.4 C  SpO2: 99% 97%    Last Pain:  Vitals:   03/03/24 0919  TempSrc:   PainSc: 0-No pain                 Lynwood KANDICE Clause

## 2024-03-03 NOTE — Op Note (Addendum)
 [facility] Gastroenterology Patient Name: Maureen Randall Procedure Date: 03/03/2024 8:45 AM MRN: [id number] Account #: [id number] Date of Birth: 06/09/1966 Admit Type: Outpatient Age: 58 Room: [facility] OR ROOM 01 Gender: Female Note Status: Supervisor Override Instrument Name: Endoscope [id number] Procedure:             Upper GI endoscopy Indications:           Epigastric abdominal pain, Abdominal bloating, Chronic                         nausea Providers:             Corinn Jess Brooklyn MD, MD Referring MD:          Leron Glance (Referring MD) Medicines:             General Anesthesia Complications:         No immediate complications. Estimated blood loss: None. Procedure:             Pre-Anesthesia Assessment:                        - Prior to the procedure, a History and Physical was                         performed, and patient medications and allergies were                         reviewed. The patient is competent. The risks and                         benefits of the procedure and the sedation options and                         risks were discussed with the patient. All questions                         were answered and informed consent was obtained.                         Patient identification and proposed procedure were                         verified by the physician, the nurse, the                         anesthesiologist, the anesthetist and the technician                         in the pre-procedure area in the procedure room in the                         endoscopy suite. Mental Status Examination: alert and                         oriented. Airway Examination: normal oropharyngeal                         airway and neck mobility. Respiratory Examination:  clear to auscultation. CV Examination: normal.                         Prophylactic Antibiotics: The patient does not require                         prophylactic antibiotics.  Prior Anticoagulants: The                         patient has taken no anticoagulant or antiplatelet                         agents. ASA Grade Assessment: II - A patient with mild                         systemic disease. After reviewing the risks and                         benefits, the patient was deemed in satisfactory                         condition to undergo the procedure. The anesthesia                         plan was to use general anesthesia. Immediately prior                         to administration of medications, the patient was                         re-assessed for adequacy to receive sedatives. The                         heart rate, respiratory rate, oxygen saturations,                         blood pressure, adequacy of pulmonary ventilation, and                         response to care were monitored throughout the                         procedure. The physical status of the patient was                         re-assessed after the procedure.                        After obtaining informed consent, the endoscope was                         passed under direct vision. Throughout the procedure,                         the patient's blood pressure, pulse, and oxygen                         saturations were monitored continuously. The Endoscope  was introduced through the mouth, and advanced to the                         second part of duodenum. The upper GI endoscopy was                         accomplished without difficulty. The patient tolerated                         the procedure well. Findings:      The duodenal bulb and second portion of the duodenum were normal.       Biopsies were taken with a cold forceps for histology.      The entire examined stomach was normal. Biopsies were taken with a cold       forceps for histology.      A small hiatal hernia was present.      The cardia and gastric fundus were normal on retroflexion.       The Z-line was irregular.      The examined esophagus was normal. Impression:            - Normal duodenal bulb and second portion of the                         duodenum. Biopsied.                        - Normal stomach. Biopsied.                        - Small hiatal hernia.                        - Z-line irregular.                        - Normal esophagus. Recommendation:        - Await pathology results.                        - Discharge patient to home (with escort).                        - Resume previous diet today.                        - Continue present medications.                        - Follow an antireflux regimen indefinitely. Procedure Code(s):     --- Professional ---                        [phone], Esophagogastroduodenoscopy, flexible,                         transoral; with biopsy, single or multiple Diagnosis Code(s):     --- Professional ---                        K44.9, Diaphragmatic hernia without obstruction or  gangrene                        K22.89, Other specified disease of esophagus                        R10.13, Epigastric pain                        R14.0, Abdominal distension (gaseous) CPT copyright 2022 American [employer] Association. All rights reserved. The codes documented in this report are preliminary and upon coder review may  be revised to meet current compliance requirements. Dr. Corinn Brooklyn Corinn Jess Brooklyn MD, MD 03/03/2024 9:05:14 AM This report has been signed electronically. Number of Addenda: 0 Note Initiated On: 03/03/2024 8:45 AM Total Procedure Duration: 0 hours 7 minutes 25 seconds  Estimated Blood Loss:  Estimated blood loss: none.      [facility]

## 2024-03-03 NOTE — Transfer of Care (Signed)
 Immediate Anesthesia Transfer of Care Note  Patient: Maureen Randall  Procedure(s) Performed: EGD (ESOPHAGOGASTRODUODENOSCOPY) (Mouth)  Patient Location: PACU  Anesthesia Type: No value filed.  Level of Consciousness: awake, alert  and patient cooperative  Airway and Oxygen Therapy: Patient Spontanous Breathing and Patient connected to supplemental oxygen  Post-op Assessment: Post-op Vital signs reviewed, Patient's Cardiovascular Status Stable, Respiratory Function Stable, Patent Airway and No signs of Nausea or vomiting  Post-op Vital Signs: Reviewed and stable  Complications: No notable events documented.

## 2024-03-05 LAB — SURGICAL PATHOLOGY

## 2024-03-06 ENCOUNTER — Ambulatory Visit: Payer: Self-pay | Admitting: Gastroenterology

## 2024-03-10 ENCOUNTER — Other Ambulatory Visit: Payer: Self-pay | Admitting: Gastroenterology

## 2024-03-10 DIAGNOSIS — R140 Abdominal distension (gaseous): Secondary | ICD-10-CM

## 2024-03-10 DIAGNOSIS — R110 Nausea: Secondary | ICD-10-CM

## 2024-03-16 ENCOUNTER — Encounter: Payer: Self-pay | Admitting: Internal Medicine

## 2024-03-18 ENCOUNTER — Encounter (INDEPENDENT_AMBULATORY_CARE_PROVIDER_SITE_OTHER): Payer: Self-pay | Admitting: Adult Health

## 2024-03-18 ENCOUNTER — Ambulatory Visit (INDEPENDENT_AMBULATORY_CARE_PROVIDER_SITE_OTHER): Admitting: Adult Health

## 2024-03-18 VITALS — BP 104/72 | HR 83 | Temp 98.3°F | Ht 66.0 in | Wt 164.0 lb

## 2024-03-18 DIAGNOSIS — E559 Vitamin D deficiency, unspecified: Secondary | ICD-10-CM | POA: Diagnosis not present

## 2024-03-18 DIAGNOSIS — E669 Obesity, unspecified: Secondary | ICD-10-CM

## 2024-03-18 DIAGNOSIS — K5904 Chronic idiopathic constipation: Secondary | ICD-10-CM

## 2024-03-18 DIAGNOSIS — Z6828 Body mass index (BMI) 28.0-28.9, adult: Secondary | ICD-10-CM

## 2024-03-18 DIAGNOSIS — E538 Deficiency of other specified B group vitamins: Secondary | ICD-10-CM | POA: Diagnosis not present

## 2024-03-18 DIAGNOSIS — R7303 Prediabetes: Secondary | ICD-10-CM | POA: Diagnosis not present

## 2024-03-18 MED ORDER — WEGOVY 0.5 MG/0.5ML ~~LOC~~ SOAJ: 0.5000 mg | SUBCUTANEOUS | 0 refills | Status: DC

## 2024-03-18 NOTE — Progress Notes (Signed)
 WEIGHT SUMMARY AND BIOMETRICS  Vitals Temp: 98.3 F (36.8 C) BP: 104/72 Pulse Rate: 83 SpO2: 98 %   Anthropometric Measurements Height: 5' 6 (1.676 m) Weight: 164 lb (74.4 kg) BMI (Calculated): 26.48 Weight at Last Visit: 173 lb Weight Lost Since Last Visit: 9 lb Weight Gained Since Last Visit: 0 Starting Weight: 207 lb Total Weight Loss (lbs): 43 lb (19.5 kg) Peak Weight: 216 lb   Body Composition  Body Fat %: 42.8 % Fat Mass (lbs): 70.4 lbs Muscle Mass (lbs): 89.4 lbs Total Body Water  (lbs): 67.6 lbs Visceral Fat Rating : 9   Other Clinical Data Fasting: yes Labs: no Today's Visit #: 15 Starting Date: 11/28/22    Chief Complaint:   OBESITY Malayah is here to discuss her progress with her obesity treatment plan.  She is on the the Category 3 Plan and states she is following her eating plan approximately 40 % of the time.  She states she is exercising 25-30 Walking minutes 3 times per week.  Interim History:  Maureen Randall reports decades of nausea and constipation.  She was started on Wegovy  0.25mg  on/about 11/04/2023  12/16/2023 Wegovy  0.25mg  increased to 0.5mg  02/11/2024 Wegovy  0.5mg  increased to 1mg  03/18/2024 Wegovy  decreased from 1mg  to 0.5mg   She feels that weekly GLP-1 therapy has NOT worsened chronic GI complaints.  Discussed at length the risks/benefits of Wegovy  therapy. She prefers to remain on, however agreeable to dose reduction and to space out administration from every 7 to 14 days  Of note- she is post menopausal   03/03/2024 EGD Completed 03/06/2024 Gastroenterology Notes: Please inform patient pathology results from upper endoscopy came back negative.  Recommend gastric emptying study to evaluate symptoms of chronic nausea and abdominal bloating as discussed during office visit   Subjective:   1. B12 deficiency She is on daily oral B12 500 mcg  2. Vitamin D  deficiency  Latest Reference Range & Units 11/28/22 08:44 04/29/23 08:04  12/16/23 08:14  Vitamin D , 25-Hydroxy 30.0 - 100.0 ng/mL 59.4 73.6 76.4   Vit D level is stable and at goal  3. Prediabetes Lab Results  Component Value Date   HGBA1C 5.5 12/16/2023   HGBA1C 5.7 (H) 04/29/2023   HGBA1C 5.6 11/05/2022    She was started on Wegovy  0.25mg  on/about 11/04/2023  12/16/2023 Wegovy  0.25mg  increased to 0.5mg  02/11/2024 Wegovy  0.5mg  increased to 1mg  03/18/2024 Wegovy  decreased from 1mg  to 0.5mg   She feels that weekly GLP-1 therapy has NOT worsened chronic GI complaints.  Denies mass in neck, dysphagia, dyspepsia, persistent hoarseness, abdominal pain, or worsening N/C  Discussed at length the risks/benefits of Wegovy  therapy. She prefers to remain on, however agreeable to dose reduction and to space out administration from every 7 to 14 days  4. Chronic idiopathic constipation In times past she would have 1 BM per week. In the last several months she reports 1 BM every 2-3 days She denies abdominal pain or hematochezia Gastro started her on daily Amitiza  24 mcg- she has yet to start She is using daily supply of Linzess   She will convert from Linzess  to Amitiza  once home supply exhausted  Assessment/Plan:   1. B12 deficiency Check Labs - Vitamin B12  2. Vitamin D  deficiency Check Labs - VITAMIN D  25 Hydroxy (Vit-D Deficiency, Fractures)  3. Prediabetes Check Labs - Comprehensive metabolic panel with GFR - Hemoglobin A1c - Insulin , random  4. Chronic idiopathic constipation Remain well hydrated and increase fiber intake Walk after meals  5. Obesity (BMI 30-39.9), CURRENT BMI 28.1 (Primary) Refill and DECREASE semaglutide -weight management (WEGOVY ) 0.5 MG/0.5ML SOAJ SQ injection Inject 0.5 mg into the skin once a week. Pt will take Q14 days- stop if GU upset worsens Dispense: 2 mL, Refills: 0 ordered   IF GI SX's WORSEN STOP GLP-1 AND CONTACT HWW Pt verbalized understanding and agreement  Maureen Randall is currently in the action stage of  change. As such, her goal is to continue with weight loss efforts. She has agreed to the Category 3 Plan.   Exercise goals: All adults should avoid inactivity. Some physical activity is better than none, and adults who participate in any amount of physical activity gain some health benefits. Adults should also include muscle-strengthening activities that involve all major muscle groups on 2 or more days a week. Walk after meals  Behavioral modification strategies: increasing lean protein intake, decreasing simple carbohydrates, increasing vegetables, increasing water  intake, meal planning and cooking strategies, keeping healthy foods in the home, ways to avoid boredom eating, ways to avoid night time snacking, and planning for success.  Maureen Randall has agreed to follow-up with our clinic in 4 weeks. She was informed of the importance of frequent follow-up visits to maximize her success with intensive lifestyle modifications for her multiple health conditions.   Maureen Randall was informed we would discuss her lab results at her next visit unless there is a critical issue that needs to be addressed sooner. Maureen Randall agreed to keep her next visit at the agreed upon time to discuss these results.  Objective:   Blood pressure 104/72, pulse 83, temperature 98.3 F (36.8 C), height 5' 6 (1.676 m), weight 164 lb (74.4 kg), SpO2 98%. Body mass index is 26.47 kg/m.  General: Cooperative, alert, well developed, in no acute distress. HEENT: Conjunctivae and lids unremarkable. Cardiovascular: Regular rhythm.  Lungs: Normal work of breathing. Neurologic: No focal deficits.   Lab Results  Component Value Date   CREATININE 0.77 10/31/2023   BUN 18 10/31/2023   NA 143 10/31/2023   K 4.2 10/31/2023   CL 105 10/31/2023   CO2 25 10/31/2023   Lab Results  Component Value Date   ALT 19 10/31/2023   AST 19 10/31/2023   ALKPHOS 80 10/31/2023   BILITOT 0.3 10/31/2023   Lab Results  Component Value Date   HGBA1C 5.5  12/16/2023   HGBA1C 5.7 (H) 04/29/2023   HGBA1C 5.6 11/05/2022   HGBA1C 5.5 12/14/2021   HGBA1C 5.5 09/25/2019   Lab Results  Component Value Date   INSULIN  12.1 12/16/2023   INSULIN  8.2 04/29/2023   INSULIN  13.7 11/28/2022   Lab Results  Component Value Date   TSH 3.04 11/05/2022   Lab Results  Component Value Date   CHOL 243 (H) 11/06/2023   HDL 82 11/06/2023   LDLCALC 147 (H) 11/06/2023   TRIG 83 11/06/2023   CHOLHDL 3.0 11/06/2023   Lab Results  Component Value Date   VD25OH 76.4 12/16/2023   VD25OH 73.6 04/29/2023   VD25OH 59.4 11/28/2022   Lab Results  Component Value Date   WBC 7.2 11/16/2022   HGB 12.5 11/16/2022   HCT 38.3 11/16/2022   MCV 93.2 11/16/2022   PLT 338 11/16/2022   Lab Results  Component Value Date   IRON 77 09/25/2019   TIBC 336 09/25/2019   FERRITIN 37 09/25/2019   Attestation Statements:   Reviewed by clinician on day of visit: allergies, medications, problem list, medical history, surgical history, family history, social history, and previous  encounter notes.  I have reviewed the above documentation for accuracy and completeness, and I agree with the above. -  Vershawn Westrup d. Codi Kertz, NP-C

## 2024-03-19 LAB — HEMOGLOBIN A1C
Est. average glucose Bld gHb Est-mCnc: 105 mg/dL
Hgb A1c MFr Bld: 5.3 % (ref 4.8–5.6)

## 2024-03-19 LAB — COMPREHENSIVE METABOLIC PANEL WITH GFR
ALT: 17 IU/L (ref 0–32)
AST: 15 IU/L (ref 0–40)
Albumin: 4.3 g/dL (ref 3.8–4.9)
Alkaline Phosphatase: 71 IU/L (ref 44–121)
BUN/Creatinine Ratio: 15 (ref 9–23)
BUN: 12 mg/dL (ref 6–24)
Bilirubin Total: 0.3 mg/dL (ref 0.0–1.2)
CO2: 23 mmol/L (ref 20–29)
Calcium: 9.4 mg/dL (ref 8.7–10.2)
Chloride: 103 mmol/L (ref 96–106)
Creatinine, Ser: 0.8 mg/dL (ref 0.57–1.00)
Globulin, Total: 2.2 g/dL (ref 1.5–4.5)
Glucose: 86 mg/dL (ref 70–99)
Potassium: 3.8 mmol/L (ref 3.5–5.2)
Sodium: 142 mmol/L (ref 134–144)
Total Protein: 6.5 g/dL (ref 6.0–8.5)
eGFR: 85 mL/min/1.73 (ref >59)

## 2024-03-19 LAB — VITAMIN B12: Vitamin B-12: 958 pg/mL (ref 232–1245)

## 2024-03-19 LAB — VITAMIN D 25 HYDROXY (VIT D DEFICIENCY, FRACTURES): Vit D, 25-Hydroxy: 62.7 ng/mL (ref 30.0–100.0)

## 2024-03-19 LAB — INSULIN, RANDOM: INSULIN: 10.1 u[IU]/mL (ref 2.6–24.9)

## 2024-03-29 ENCOUNTER — Other Ambulatory Visit: Payer: Self-pay | Admitting: Gastroenterology

## 2024-04-03 ENCOUNTER — Ambulatory Visit

## 2024-04-07 ENCOUNTER — Telehealth: Payer: Self-pay

## 2024-04-07 NOTE — Telephone Encounter (Signed)
 Called and informed pt and informed pt that's he was only scheduled for a flu shot and that we do not have any orders for a Dr.Hilty dn she stated she spoke to someone who stated they saw the orders. I informed her she may need to reach out to Dr. Mona to have them order the labs as future and that I would ad an addendum to the Nurse visit appt for pt to also receive labs. Pt stated she will reach out to Dr. Mona to have them order the labs correctly

## 2024-04-07 NOTE — Telephone Encounter (Signed)
 Copied from CRM [id number]. Topic: General - Other >> Apr 07, 2024  9:12 AM Rosina BIRCH wrote: Reason for CRM: patient called stating cardiologist was suppose to send some lab work to the office to get done and she wanted to know if it was there. Patient also want to know how often do you get a Pneumonia shot

## 2024-04-10 ENCOUNTER — Encounter: Payer: Self-pay | Admitting: Internal Medicine

## 2024-04-10 DIAGNOSIS — E785 Hyperlipidemia, unspecified: Secondary | ICD-10-CM

## 2024-04-13 ENCOUNTER — Ambulatory Visit

## 2024-04-15 ENCOUNTER — Encounter (INDEPENDENT_AMBULATORY_CARE_PROVIDER_SITE_OTHER): Payer: Self-pay | Admitting: Adult Health

## 2024-04-15 ENCOUNTER — Ambulatory Visit (INDEPENDENT_AMBULATORY_CARE_PROVIDER_SITE_OTHER): Admitting: Adult Health

## 2024-04-15 VITALS — BP 122/83 | HR 73 | Temp 98.5°F | Ht 66.0 in | Wt 165.0 lb

## 2024-04-15 DIAGNOSIS — R632 Polyphagia: Secondary | ICD-10-CM

## 2024-04-15 DIAGNOSIS — E538 Deficiency of other specified B group vitamins: Secondary | ICD-10-CM | POA: Diagnosis not present

## 2024-04-15 DIAGNOSIS — E559 Vitamin D deficiency, unspecified: Secondary | ICD-10-CM | POA: Diagnosis not present

## 2024-04-15 DIAGNOSIS — Z6826 Body mass index (BMI) 26.0-26.9, adult: Secondary | ICD-10-CM

## 2024-04-15 DIAGNOSIS — R7303 Prediabetes: Secondary | ICD-10-CM | POA: Diagnosis not present

## 2024-04-15 DIAGNOSIS — K5904 Chronic idiopathic constipation: Secondary | ICD-10-CM

## 2024-04-15 DIAGNOSIS — E669 Obesity, unspecified: Secondary | ICD-10-CM

## 2024-04-15 MED ORDER — METFORMIN HCL ER 500 MG PO TB24: 500.0000 mg | ORAL_TABLET | Freq: Every day | ORAL | 0 refills | Status: DC

## 2024-04-15 MED ORDER — WEGOVY 0.5 MG/0.5ML ~~LOC~~ SOAJ: SUBCUTANEOUS | 0 refills | Status: DC

## 2024-04-15 NOTE — Progress Notes (Signed)
 WEIGHT SUMMARY AND BIOMETRICS  Vitals Temp: 98.5 F (36.9 C) BP: 122/83 Pulse Rate: 73 SpO2: 98 %   Anthropometric Measurements Height: 5' 6 (1.676 m) Weight: 165 lb (74.8 kg) BMI (Calculated): 26.64 Weight at Last Visit: 164lb Weight Lost Since Last Visit: 0lb Weight Gained Since Last Visit: 1lb Starting Weight: 207lb Total Weight Loss (lbs): 42 lb (19.1 kg) Peak Weight: 216lb   Body Composition  Body Fat %: 43 % Fat Mass (lbs): 71.2 lbs Muscle Mass (lbs): 89.6 lbs Total Body Water  (lbs): 71.4 lbs Visceral Fat Rating : 10   Other Clinical Data Fasting: No Labs: no Today's Visit #: 16 Starting Date: 11/28/22    Chief Complaint:   OBESITY Maureen Randall is here to discuss her progress with her obesity treatment plan. She is on the the Category 3 Plan and states she is following her eating plan approximately 50 % of the time.  She states she is exercising Walking 30 minutes 4 times per week.  Interim History:  She reports increased simple CHO with spaced out Wegovy  0.5mg  injections, Q14 days  She denies any acute GI upset, having BM every 3 days (aided by Linzess ).  She stopped daily plain Metformin  500mg - discussed restarting on XR formulation.  Reviewed Bioimpedance Results with pt: Muscle Mass: +0.2 Adipose Mass: +0.8  Subjective:   1. Chronic idiopathic constipation Maureen Randall reports decades of nausea and constipation.   She was started on Wegovy  0.25mg  on/about 11/04/2023    She feels that weekly GLP-1 therapy has NOT worsened chronic GI complaints.   She reports increased simple CHO with spaced out Wegovy  0.5mg  injections, Q14 days  She denies any acute GI upset, having BM every 3 days (aided by Linzess ).  She stopped daily plain Metformin  500mg - discussed restarting on XR formulation.  2. Vitamin D  deficiency Discussed Labs  Latest Reference Range & Units 03/18/24 10:06  Vitamin D , 25-Hydroxy 30.0 - 100.0 ng/mL 62.7       She is not on  supplementation Vit D Level at goal  3. B12 deficiency Discussed Labs Vitamin B12 232 - 1,245 pg/mL 958   B12 level at goal She is on daily oral cyanocobalamin  (VITAMIN B12) 500 MCG tablet   4. Prediabetes Discussed Labs  Latest Reference Range & Units 03/18/24 10:06  Glucose 70 - 99 mg/dL 86  Hemoglobin J8R 4.8 - 5.6 % 5.3  Est. average glucose Bld gHb Est-mCnc mg/dL 894  INSULIN  2.6 - 24.9 uIU/mL 10.1   She was started on Wegovy  0.25mg  on/about 11/04/2023    She feels that weekly GLP-1 therapy has NOT worsened chronic GI complaints.   She reports increased simple CHO with spaced out Wegovy  0.5mg  injections, Q14 days  She denies any acute GI upset, having BM every 3 days (aided by Linzess ).  She stopped daily plain Metformin  500mg - discussed restarting on XR formulation.  5. Polyphagia She was started on Wegovy  0.25mg  on/about 11/04/2023    She feels that weekly GLP-1 therapy has NOT worsened chronic GI complaints.   She reports increased simple CHO with spaced out Wegovy  0.5mg  injections, Q14 days  She denies any acute GI upset, having BM every 3 days (aided by Linzess ).  She stopped daily plain Metformin  500mg - discussed restarting on XR formulation.  Assessment/Plan:   1. Chronic idiopathic constipation Remain well hydrated with water  Complete supply of Linzess , the convert to Armitza  2. Vitamin D  deficiency Monitor Labs  3. B12 deficiency (Primary) Continue current oral B12 supplementation  daily oral cyanocobalamin  (VITAMIN B12) 500 MCG tablet   4. Prediabetes Stop plain Metformin  500mg  Start metFORMIN  (GLUCOPHAGE -XR) 500 MG 24 hr tablet Take 1 tablet (500 mg total) by mouth daily with breakfast. Dispense: 30 tablet, Refills: 0 ordered  Refill   semaglutide -weight management (WEGOVY ) 0.5 MG/0.5ML SOAJ SQ injection Pt will take Q10 days- stop if GU upset worsens Dispense: 2 mL, Refills: 0 ordered   5. Polyphagia Refill and increase administration to  every 10D   semaglutide -weight management (WEGOVY ) 0.5 MG/0.5ML SOAJ SQ injection Pt will take Q10 days- stop if GU upset worsens Dispense: 2 mL, Refills: 0 ordered   6. Obesity (BMI 30-39.9), CURRENT BMI 26.7 Refill and increase administration to every 10D   semaglutide -weight management (WEGOVY ) 0.5 MG/0.5ML SOAJ SQ injection Pt will take Q10 days- stop if GU upset worsens Dispense: 2 mL, Refills: 0 ordered   Maureen Randall is currently in the action stage of change. As such, her goal is to continue with weight loss efforts. She has agreed to the Category 3 Plan.   Exercise goals: For substantial health benefits, adults should do at least 150 minutes (2 hours and 30 minutes) a week of moderate-intensity, or 75 minutes (1 hour and 15 minutes) a week of vigorous-intensity aerobic physical activity, or an equivalent combination of moderate- and vigorous-intensity aerobic activity. Aerobic activity should be performed in episodes of at least 10 minutes, and preferably, it should be spread throughout the week.  Behavioral modification strategies: increasing lean protein intake, decreasing simple carbohydrates, increasing vegetables, increasing water  intake, no skipping meals, meal planning and cooking strategies, keeping healthy foods in the home, ways to avoid boredom eating, and planning for success.  Maureen Randall has agreed to follow-up with our clinic in 4 weeks. She was informed of the importance of frequent follow-up visits to maximize her success with intensive lifestyle modifications for her multiple health conditions.   Objective:   Blood pressure 122/83, pulse 73, temperature 98.5 F (36.9 C), height 5' 6 (1.676 m), weight 165 lb (74.8 kg), SpO2 98%. Body mass index is 26.63 kg/m.  General: Cooperative, alert, well developed, in no acute distress. HEENT: Conjunctivae and lids unremarkable. Cardiovascular: Regular rhythm.  Lungs: Normal work of breathing. Neurologic: No focal deficits.   Lab  Results  Component Value Date   CREATININE 0.80 03/18/2024   BUN 12 03/18/2024   NA 142 03/18/2024   K 3.8 03/18/2024   CL 103 03/18/2024   CO2 23 03/18/2024   Lab Results  Component Value Date   ALT 17 03/18/2024   AST 15 03/18/2024   ALKPHOS 71 03/18/2024   BILITOT 0.3 03/18/2024   Lab Results  Component Value Date   HGBA1C 5.3 03/18/2024   HGBA1C 5.5 12/16/2023   HGBA1C 5.7 (H) 04/29/2023   HGBA1C 5.6 11/05/2022   HGBA1C 5.5 12/14/2021   Lab Results  Component Value Date   INSULIN  10.1 03/18/2024   INSULIN  12.1 12/16/2023   INSULIN  8.2 04/29/2023   INSULIN  13.7 11/28/2022   Lab Results  Component Value Date   TSH 3.04 11/05/2022   Lab Results  Component Value Date   CHOL 243 (H) 11/06/2023   HDL 82 11/06/2023   LDLCALC 147 (H) 11/06/2023   TRIG 83 11/06/2023   CHOLHDL 3.0 11/06/2023   Lab Results  Component Value Date   VD25OH 62.7 03/18/2024   VD25OH 76.4 12/16/2023   VD25OH 73.6 04/29/2023   Lab Results  Component Value Date   WBC 7.2 11/16/2022   HGB  12.5 11/16/2022   HCT 38.3 11/16/2022   MCV 93.2 11/16/2022   PLT 338 11/16/2022   Lab Results  Component Value Date   IRON 77 09/25/2019   TIBC 336 09/25/2019   FERRITIN 37 09/25/2019   Attestation Statements:   Reviewed by clinician on day of visit: allergies, medications, problem list, medical history, surgical history, family history, social history, and previous encounter notes.  I have reviewed the above documentation for accuracy and completeness, and I agree with the above. -  Dessie Delcarlo d. Shakur Lembo, NP-C

## 2024-04-29 ENCOUNTER — Ambulatory Visit

## 2024-04-29 DIAGNOSIS — E785 Hyperlipidemia, unspecified: Secondary | ICD-10-CM

## 2024-04-29 DIAGNOSIS — Z23 Encounter for immunization: Secondary | ICD-10-CM

## 2024-04-29 NOTE — Progress Notes (Signed)
 Patient was administered a regular flu vaccine into her left deltoid. Patient tolerated the regular flu vaccine well.

## 2024-04-30 ENCOUNTER — Ambulatory Visit: Payer: Self-pay | Admitting: Nurse Practitioner

## 2024-04-30 LAB — LIPID PANEL
Chol/HDL Ratio: 2.8 ratio (ref 0.0–4.4)
Cholesterol, Total: 189 mg/dL (ref 100–199)
HDL: 67 mg/dL
LDL Chol Calc (NIH): 107 mg/dL — ABNORMAL HIGH (ref 0–99)
Triglycerides: 80 mg/dL (ref 0–149)
VLDL Cholesterol Cal: 15 mg/dL (ref 5–40)

## 2024-04-30 LAB — ALT: ALT: 17 IU/L (ref 0–32)

## 2024-05-12 ENCOUNTER — Telehealth: Admitting: Psychiatry

## 2024-05-19 ENCOUNTER — Ambulatory Visit (INDEPENDENT_AMBULATORY_CARE_PROVIDER_SITE_OTHER): Admitting: Adult Health

## 2024-05-20 ENCOUNTER — Encounter (INDEPENDENT_AMBULATORY_CARE_PROVIDER_SITE_OTHER): Payer: Self-pay | Admitting: Adult Health

## 2024-05-20 ENCOUNTER — Ambulatory Visit (INDEPENDENT_AMBULATORY_CARE_PROVIDER_SITE_OTHER): Payer: Self-pay | Admitting: Adult Health

## 2024-05-20 VITALS — BP 108/70 | HR 66 | Temp 98.1°F | Ht 66.0 in | Wt 165.0 lb

## 2024-05-20 DIAGNOSIS — R112 Nausea with vomiting, unspecified: Secondary | ICD-10-CM | POA: Diagnosis not present

## 2024-05-20 DIAGNOSIS — R7303 Prediabetes: Secondary | ICD-10-CM

## 2024-05-20 DIAGNOSIS — M461 Sacroiliitis, not elsewhere classified: Secondary | ICD-10-CM | POA: Diagnosis not present

## 2024-05-20 DIAGNOSIS — E669 Obesity, unspecified: Secondary | ICD-10-CM

## 2024-05-20 DIAGNOSIS — Z6826 Body mass index (BMI) 26.0-26.9, adult: Secondary | ICD-10-CM

## 2024-05-20 DIAGNOSIS — E559 Vitamin D deficiency, unspecified: Secondary | ICD-10-CM | POA: Diagnosis not present

## 2024-05-20 DIAGNOSIS — E538 Deficiency of other specified B group vitamins: Secondary | ICD-10-CM

## 2024-05-20 MED ORDER — WEGOVY 1 MG/0.5ML ~~LOC~~ SOAJ: SUBCUTANEOUS | 0 refills | Status: DC

## 2024-05-20 NOTE — Progress Notes (Signed)
 WEIGHT SUMMARY AND BIOMETRICS  Vitals Temp: 98.1 F (36.7 C) BP: 108/70 Pulse Rate: 66 SpO2: 100 %   Anthropometric Measurements Height: 5' 6 (1.676 m) Weight: 165 lb (74.8 kg) BMI (Calculated): 26.64 Weight at Last Visit: 165 lb Weight Lost Since Last Visit: 0 Weight Gained Since Last Visit: 0 Starting Weight: 207 lb Total Weight Loss (lbs): 42 lb (19.1 kg) Peak Weight: 216 lb   Body Composition  Body Fat %: 42.5 % Fat Mass (lbs): 70.2 lbs Muscle Mass (lbs): 90.4 lbs Total Body Water  (lbs): 71.2 lbs Visceral Fat Rating : 9   Other Clinical Data Fasting: no Labs: no Today's Visit #: 17 Starting Date: 11/28/22    Chief Complaint:   OBESITY Maureen Randall is here to discuss her progress with her obesity treatment plan.  She is on the the Category 3 Plan and states she is following her eating plan approximately 50 % of the time.  She states she is exercising Walking 35-40 minutes 3-4 times per week.  Interim History:  06/30/2024 scheduled for: Sacroiliitis;  Fusion of lumbar spine   Last dose of Metformn XR 500mg  was 07/06/24- she experienced N/V for 48 hrs Last dose of Wegovy  0.5mg  was 05/10/2024- no GI upset  She would like to remain off Metforming and increase Wegovy  to 1mg - still administer every 10 days  Patient was counseled on the importance of maintaining healthy lifestyle habits, including balanced nutrition, regular physical activity, and behavioral modifications, while taking antiobesity medication.   Patient verbalized understanding that medication is an adjunct to, not a replacement for, lifestyle changes and that the long-term success and weight maintenance depend on continued adherence to these strategies.  Subjective:   1. Sacroiliitis 04/12/2024 [facility] [place] [facility] Notes: HPI Pt is 11 months s/p revision L3-S1 Lami, PSF/TLIF with Dr. Gust on 07/08/23. Hx L5-S1 OLIF with Dr. Gust on  09/28/19. Previous L4-5 PSF in 2016 in [place] with Dr. Ema. Pt is somewhat pleased with surgical outcome and progress. Pt is now able to walk further distances and reports improvement of preop lbp and right thigh pain. She gets some left groin pain with walking farther distances. She had both hips replaced in 2019 which she did well with. She also has had increased lbp and muscle spasms over the last few months. She was determined to have SI joint pain and underwent B SI joint injection 02/24/24 with diagnostic relief, she had a repeat B SI joint injections on 04/08/24 and presents today for follow up.  She continues to report persistent lumbosacral mechanical pain. It is aggravating her hips and states she can't lay on either side at night. She denies any radiating N/T. Denies any B/B dysfunction or saddle anesthesia.  PMH: B THA 2019  Pt is currently taking percocet 10 4/day with good relief. She has transitioned back to pain management. She was taking OXY 10 Q6 prescribed by pain man prior to surgery.   INJECTIONS B SI joint injections 02/24/24 with >80% relief lasting about 2 weeks. She states the pain has now returned. Also reports now having muscle spasms.  B SI joint injections 04/08/24   IMAGING X-rays 01/09/24: fusion construct overall appears to be in excellent position and alignment. No hardware loosening. Posterior-lateral graft appears to be maturing nicely. Adjacent segments are stable. No new fractures are noted. On the AP view there is bilateral SI joint line degenerative changes noted, R>L.  2. Nausea and  vomiting, unspecified vomiting type She stopped Metformin  XR 500mg  07/06/24 She experienced N/V for the next 48 hrs- then resolved. Her last dose of Wegovy  0.5mg  was on 11/2- no GI upset after injection. She would prefer to remain off oral Metformin  and continue GLP-1 therapy She also requests to increase from 0.5mg  to 1mg  Discussed risks/benefits of increased GLP-1 therapy  3.  Vitamin D  deficiency  Latest Reference Range & Units 04/29/23 08:04 12/16/23 08:14 03/18/24 10:06  Vitamin D , 25-Hydroxy 30.0 - 100.0 ng/mL 73.6 76.4 62.7   Vit D level stable and at goal  4. Prediabetes Lab Results  Component Value Date   HGBA1C 5.3 03/18/2024   HGBA1C 5.5 12/16/2023   HGBA1C 5.7 (H) 04/29/2023    Last dose of Metformn XR 500mg  was 07/06/24- she experienced N/V for 48 hrs Last dose of Wegovy  0.5mg  was 05/10/2024- no GI upset  She would like to remain off Metforming and increase Wegovy  to 1mg - still administer every 10 days  5. B12 deficiency  Latest Reference Range & Units 04/29/23 08:04 12/16/23 08:14 03/18/24 10:06  Vitamin B12 232 - 1,245 pg/mL [phone]   B12 level stable  Assessment/Plan:   1. Sacroiliitis 04/12/2024 [facility] [place] [facility] Notes: Assessment / Plan:   ASSESSMENT 1. Sacroiliitis (Primary) 2. Fusion of lumbar spine   PLAN Patient has had diagnostic relief with recent B SI joint injections. She is at a point where she would like a more permanent solution to her pain. She would like to consider surgical options. She states that although her pain is over both SI joints, it is worst on her right side. Given her large fusion construct certainly that could be putting increased pressure on the SI joints. Previously discussed doing formal PT but she states it is too expensive. Therefore plan to continue home exercises and getting into a pool as tolerated. Dr. Gust spent over 45 minutes with patient face to face today discussing surgery. Dr. Gust showed the pt models and diagrams to describe surgery in detail. Discussed the prolonged recovery after this type of surgery and the typical hospital stay. Discussed that there are no guarantees with surgery. Pain may be new, different or worse after the surgery.   2. Nausea and vomiting, unspecified vomiting type (Primary) Remain off Metformin  Monitor  for GI upset- contact [facility] with questions/concerns  3. Vitamin D  deficiency Monitor Labs  4. Prediabetes Continue healthy eating and regular exercise  5. B12 deficiency Monitor Labs  6. Obesity (BMI 30-39.9), CURRENT BMI 26.7  Maureen Randall is currently in the action stage of change. As such, her goal is to continue with weight loss efforts. She has agreed to the Category 3 Plan.   Exercise goals: All adults should avoid inactivity. Some physical activity is better than none, and adults who participate in any amount of physical activity gain some health benefits. Adults should also include muscle-strengthening activities that involve all major muscle groups on 2 or more days a week.  Behavioral modification strategies: increasing lean protein intake, decreasing simple carbohydrates, increasing vegetables, increasing water  intake, no skipping meals, meal planning and cooking strategies, keeping healthy foods in the home, ways to avoid boredom eating, and planning for success.  Maureen Randall has agreed to follow-up with our clinic in 4 weeks. She was informed of the importance of frequent follow-up visits to maximize her success with intensive lifestyle modifications for her multiple health conditions.   Consider beginning Mx Phase early  2026  Objective:   Blood pressure 108/70, pulse 66, temperature 98.1 F (36.7 C), height 5' 6 (1.676 m), weight 165 lb (74.8 kg), SpO2 100%. Body mass index is 26.63 kg/m.  General: Cooperative, alert, well developed, in no acute distress. HEENT: Conjunctivae and lids unremarkable. Cardiovascular: Regular rhythm.  Lungs: Normal work of breathing. Neurologic: No focal deficits.   Lab Results  Component Value Date   CREATININE 0.80 03/18/2024   BUN 12 03/18/2024   NA 142 03/18/2024   K 3.8 03/18/2024   CL 103 03/18/2024   CO2 23 03/18/2024   Lab Results  Component Value Date   ALT 17 04/29/2024   AST 15 03/18/2024   ALKPHOS 71 03/18/2024   BILITOT 0.3  03/18/2024   Lab Results  Component Value Date   HGBA1C 5.3 03/18/2024   HGBA1C 5.5 12/16/2023   HGBA1C 5.7 (H) 04/29/2023   HGBA1C 5.6 11/05/2022   HGBA1C 5.5 12/14/2021   Lab Results  Component Value Date   INSULIN  10.1 03/18/2024   INSULIN  12.1 12/16/2023   INSULIN  8.2 04/29/2023   INSULIN  13.7 11/28/2022   Lab Results  Component Value Date   TSH 3.04 11/05/2022   Lab Results  Component Value Date   CHOL 189 04/29/2024   HDL 67 04/29/2024   LDLCALC 107 (H) 04/29/2024   TRIG 80 04/29/2024   CHOLHDL 2.8 04/29/2024   Lab Results  Component Value Date   VD25OH 62.7 03/18/2024   VD25OH 76.4 12/16/2023   VD25OH 73.6 04/29/2023   Lab Results  Component Value Date   WBC 7.2 11/16/2022   HGB 12.5 11/16/2022   HCT 38.3 11/16/2022   MCV 93.2 11/16/2022   PLT 338 11/16/2022   Lab Results  Component Value Date   IRON 77 09/25/2019   TIBC 336 09/25/2019   FERRITIN 37 09/25/2019   Attestation Statements:   Reviewed by clinician on day of visit: allergies, medications, problem list, medical history, surgical history, family history, social history, and previous encounter notes.  I have reviewed the above documentation for accuracy and completeness, and I agree with the above. -  Devin Foskey d. Maureen Ramo, NP-C

## 2024-05-26 ENCOUNTER — Telehealth (INDEPENDENT_AMBULATORY_CARE_PROVIDER_SITE_OTHER): Payer: Self-pay

## 2024-05-26 ENCOUNTER — Telehealth: Payer: Self-pay

## 2024-05-26 NOTE — Telephone Encounter (Signed)
 Copied from CRM [id number]. Topic: Appointments - Appointment Cancel/Reschedule >> May 26, 2024 11:46 AM Pinkey ORN wrote: Patient/patient representative is calling to cancel or reschedule an appointment. Refer to attachments for appointment information. Please follow up with patient. >> May 26, 2024 11:47 AM Pinkey ORN wrote: Patient states she's having surgery December 22, therefor she's needing an sooner appointment than the 07/29/2024. Patient states she won't be able to drive after having the procedure.

## 2024-05-26 NOTE — Telephone Encounter (Signed)
 Wait for Determination Please wait for [facility] NCPDP 2017 to return a determination

## 2024-05-28 NOTE — Telephone Encounter (Signed)
 PA for Wegovy  1MG  has been denied. PA is now complete.     Message from plan: We did not find support for use, in your situation. This drug is to be used to help lose weight. 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 Medicare says that drugs used for weight loss are excluded from Part D coverage. Medicare did release a memo on September 26, 2022. This stated that anti-obesity medications remain excluded from Medicare Part D coverage. But, if that same drug received approval from the FDA for an additional medically accepted indication, then it could be considered a Part D drug- for that specific use only. This is not the case for your diagnosis. Therefore, we are unable to approve coverage under your Part D benefit. Please note: Wegovy  is FDA approved for the following two uses: to reduce the risk of cardiovascular death, heart attack and stroke in adults with established cardiovascular disease and either obesity or overweight. for the treatment of noncirrhotic metabolic dysfunction-associated steatohepatitis (MASH) with moderate to advanced liver fibrosis (consistent with stage F2 and F3 fibrosis).

## 2024-06-01 ENCOUNTER — Encounter: Payer: Self-pay | Admitting: Nurse Practitioner

## 2024-06-08 ENCOUNTER — Telehealth: Payer: Self-pay | Admitting: Pediatrics

## 2024-06-08 NOTE — Telephone Encounter (Signed)
 Good afternoon Dr. Suzann  The following patient is requesting to transfer her care to us . She has been a patient of [facility] with Dr. Jinny who is no longer practicing. Records are available to review on Epic with care everywhere. Please review and advise of scheduling. Thank you.

## 2024-06-10 ENCOUNTER — Ambulatory Visit: Admitting: Nurse Practitioner

## 2024-06-10 ENCOUNTER — Encounter: Payer: Self-pay | Admitting: Nurse Practitioner

## 2024-06-10 VITALS — BP 100/70 | HR 67 | Temp 98.1°F | Ht 66.0 in | Wt 168.4 lb

## 2024-06-10 DIAGNOSIS — K5904 Chronic idiopathic constipation: Secondary | ICD-10-CM | POA: Insufficient documentation

## 2024-06-10 DIAGNOSIS — K219 Gastro-esophageal reflux disease without esophagitis: Secondary | ICD-10-CM

## 2024-06-10 MED ORDER — POLYETHYLENE GLYCOL 3350 17 GM/SCOOP PO POWD: 17.0000 g | Freq: Every day | ORAL | 5 refills | Status: DC

## 2024-06-10 MED ORDER — FAMOTIDINE 20 MG PO TABS: 20.0000 mg | ORAL_TABLET | Freq: Every day | ORAL | 3 refills | Status: AC

## 2024-06-10 NOTE — Progress Notes (Signed)
 Leron Glance, NP-C Phone: [phone]  Maureen Randall is a 58 y.o. female who presents today for follow up.   Discussed the use of AI scribe software for clinical note transcription with the patient, who gave verbal consent to proceed.  History of Present Illness   Maureen Randall is a 58 year old female who presents for follow up on management of pain attacks and constipation.  She experiences sporadic pain attacks affecting her shoulders, legs, and feet, severe enough to require her to lie down. The attacks are unpredictable, and she is unsure of their triggers. She is under the care of a pain management specialist and takes oxycodone  and buprenorphine , which effectively manage her muscle spasms. Additionally, she takes Lyrica , 100 mg twice daily, for pain management.  She has a history of gastrointestinal issues, including gastritis and acid reflux, managed with omeprazole  and sucralfate . She reports chronic constipation, exacerbated by opioid use, and has tried various treatments including Linzess , Miralax , and lactulose, with limited success. Lactulose caused significant abdominal pain and cramping.  She has been on a weight management program with Wegovy , resulting in a weight loss of 30-40 pounds, which has improved her prediabetic status. However, she experiences nausea, possibly related to her medications, for which she has been prescribed Zofran  and promethazine .  Her family history is significant for rheumatoid arthritis in her father and degenerative arthritis in her mother. She has been evaluated for rheumatoid arthritis in the past but discontinued follow-up due to insurance coverage issues.  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 She also reports neck pain, attributed to cervical spine issues. She reports taking Xarelto , Cymbalta , and trazodone .      Social History   Tobacco Use  Smoking Status Never   Smokeless Tobacco Never    Current Outpatient Medications on File Prior to Visit  Medication Sig Dispense Refill   lactulose, encephalopathy, (CHRONULAC) 10 GM/15ML SOLN Take 30 mLs by mouth.     baclofen  (LIORESAL ) 10 MG tablet Take 1 tablet (10 mg total) by mouth 3 (three) times daily as needed for muscle spasms. 60 each 1   cetirizine (ZYRTEC) 10 MG tablet Take 10 mg by mouth daily as needed for allergies.     cyanocobalamin  (VITAMIN B12) 500 MCG tablet Take 1 tablet (500 mcg total) by mouth daily. 90 tablet 0   diclofenac  Sodium (VOLTAREN ) 1 % GEL Apply 2 g topically. (Patient not taking: Reported on 05/20/2024)     doxycycline  (VIBRAMYCIN ) 100 MG capsule Take 100 mg by mouth 2 (two) times daily.     DULoxetine  (CYMBALTA ) 60 MG capsule TAKE 1 CAPSULE BY MOUTH DAILY 90 capsule 3   estradiol  (ESTRACE ) 0.1 MG/GM vaginal cream Place 1 Applicatorful vaginally once a week.     ezetimibe  (ZETIA ) 10 MG tablet Take 10 mg by mouth.     omeprazole  (PRILOSEC) 40 MG capsule Take 1 capsule (40 mg total) by mouth 2 (two) times daily. 60 capsule 3   ondansetron  (ZOFRAN ) 8 MG tablet Take 8 mg by mouth once.     ondansetron  (ZOFRAN -ODT) 4 MG disintegrating tablet Take 0.5 tablets (2 mg total) by mouth every 6 (six) hours as needed. 20 tablet 0   oxyCODONE -acetaminophen  (PERCOCET) 10-325 MG tablet Take 1 tablet by mouth in the morning, at noon, in the evening, and at bedtime.     pregabalin  (LYRICA ) 100 MG capsule Take 100 mg by mouth 3 (three) times daily.  promethazine  (PHENERGAN ) 25 MG tablet      rivaroxaban  (XARELTO ) 10 MG TABS tablet Take 10 mg by mouth in the morning. (1000)     semaglutide -weight management (WEGOVY ) 1 MG/0.5ML SOAJ SQ injection Inject 1mg  every 10 days 2 mL 0   tiZANidine (ZANAFLEX) 4 MG tablet Take 4 mg by mouth daily as needed.     traZODone  (DESYREL ) 100 MG tablet Take 2 tablets (200 mg total) by mouth at bedtime as needed for sleep. 180 tablet 3   valACYclovir  (VALTREX ) 500  MG tablet Take 500 mg by mouth 2 (two) times daily as needed (outbreaks).     No current facility-administered medications on file prior to visit.     ROS see history of present illness  Objective  Physical Exam Vitals:   06/10/24 1350  BP: 100/70  Pulse: 67  Temp: 98.1 F (36.7 C)  SpO2: 97%    BP Readings from Last 3 Encounters:  06/10/24 100/70  05/20/24 108/70  04/15/24 122/83   Wt Readings from Last 3 Encounters:  06/10/24 168 lb 6.4 oz (76.4 kg)  05/20/24 165 lb (74.8 kg)  04/15/24 165 lb (74.8 kg)    Physical Exam Constitutional:      General: She is not in acute distress.    Appearance: Normal appearance.  HENT:     Head: Normocephalic.  Cardiovascular:     Rate and Rhythm: Normal rate and regular rhythm.     Heart sounds: Normal heart sounds.  Pulmonary:     Effort: Pulmonary effort is normal.     Breath sounds: Normal breath sounds.  Skin:    General: Skin is warm and dry.  Neurological:     General: No focal deficit present.     Mental Status: She is alert.  Psychiatric:        Mood and Affect: Mood normal.        Behavior: Behavior normal.      Assessment/Plan: Please see individual problem list.  Chronic idiopathic constipation Assessment & Plan: She experiences chronic constipation with slow bowel movements, exacerbated by opioid use. Linzess  was effective but unaffordable, so Miralax  is recommended. Prescribed Miralax  for daily use and discontinued lactulose due to adverse effects. Referred to [facility] for further evaluation.  Orders: -     Polyethylene Glycol 3350 ; Take 17 g by mouth daily. Dissolve 1 capful (17g) in 4-8 ounces of liquid and take by mouth daily.  Dispense: 510 g; Refill: 5  Gastroesophageal reflux disease without esophagitis Assessment & Plan: Chronic gastritis, likely from chronic NSAID use, is managed with omeprazole , Pepcid , and Carafate . She now avoids NSAIDs and limits alcohol. Continue these  medications and follow-up with GI.   Orders: -     Famotidine ; Take 1 tablet (20 mg total) by mouth at bedtime.  Dispense: 90 tablet; Refill: 3  Chronic pain syndrome Assessment & Plan: Chronic pain is managed with oxycodone  and buprenorphine , with fibromyalgia as a differential diagnosis. Continue current management and follow up with pain management.   Chronic bilateral low back pain, unspecified whether sciatica present Assessment & Plan: Chronic low back pain at the sacroiliac joint. Previous injection was ineffective. She is scheduled for right SI joint fusion surgery on December 23rd, to address chronic pain and dysfunction. Proceed with the scheduled surgery. Continue current medication regimen for pain management and follow-up with specialist as scheduled.    Generalized anxiety disorder Assessment & Plan: Anxiety is managed with Cymbalta  and trazodone . Continue these medications and follow-up  with psychiatry.   Obesity (BMI 30-39.9) Assessment & Plan: Obesity is managed with Wegovy , resulting in significant weight loss. Continue Wegovy  and maintain regular follow-ups with [facility]. Encourage healthy diet and regular exercise.       Return in about 6 months (around 12/09/2024) for Follow up.   Leron Glance, NP-C [facility]

## 2024-06-12 ENCOUNTER — Encounter: Payer: Self-pay | Admitting: Nurse Practitioner

## 2024-06-12 MED ORDER — POLYETHYLENE GLYCOL 3350 17 GM/SCOOP PO POWD: 17.0000 g | Freq: Every day | ORAL | 5 refills | Status: AC

## 2024-06-16 ENCOUNTER — Ambulatory Visit (INDEPENDENT_AMBULATORY_CARE_PROVIDER_SITE_OTHER): Payer: Self-pay | Admitting: Adult Health

## 2024-06-19 ENCOUNTER — Encounter (INDEPENDENT_AMBULATORY_CARE_PROVIDER_SITE_OTHER): Payer: Self-pay | Admitting: Adult Health

## 2024-06-19 ENCOUNTER — Encounter: Payer: Self-pay | Admitting: Nurse Practitioner

## 2024-06-19 MED ORDER — FLUTICASONE PROPIONATE 50 MCG/ACT NA SUSP: NASAL | 11 refills | Status: AC

## 2024-06-22 ENCOUNTER — Ambulatory Visit (INDEPENDENT_AMBULATORY_CARE_PROVIDER_SITE_OTHER): Admitting: Adult Health

## 2024-06-23 ENCOUNTER — Encounter: Payer: Self-pay | Admitting: Nurse Practitioner

## 2024-06-23 ENCOUNTER — Ambulatory Visit: Admitting: Psychiatry

## 2024-06-23 ENCOUNTER — Other Ambulatory Visit: Payer: Self-pay | Admitting: Psychiatry

## 2024-06-23 DIAGNOSIS — G4701 Insomnia due to medical condition: Secondary | ICD-10-CM

## 2024-06-23 NOTE — Assessment & Plan Note (Signed)
 She experiences chronic constipation with slow bowel movements, exacerbated by opioid use. Linzess  was effective but unaffordable, so Miralax  is recommended. Prescribed Miralax  for daily use and discontinued lactulose due to adverse effects. Referred to [facility] for further evaluation.

## 2024-06-23 NOTE — Assessment & Plan Note (Addendum)
 Obesity is managed with Wegovy , resulting in significant weight loss. Continue Wegovy  and maintain regular follow-ups with [facility]. Encourage healthy diet and regular exercise.

## 2024-06-23 NOTE — Assessment & Plan Note (Signed)
 Anxiety is managed with Cymbalta  and trazodone . Continue these medications and follow-up with psychiatry.

## 2024-06-23 NOTE — Assessment & Plan Note (Signed)
 Chronic low back pain at the sacroiliac joint. Previous injection was ineffective. She is scheduled for right SI joint fusion surgery on December 23rd, to address chronic pain and dysfunction. Proceed with the scheduled surgery. Continue current medication regimen for pain management and follow-up with specialist as scheduled.

## 2024-06-23 NOTE — Assessment & Plan Note (Signed)
 Chronic gastritis, likely from chronic NSAID use, is managed with omeprazole , Pepcid , and Carafate . She now avoids NSAIDs and limits alcohol. Continue these medications and follow-up with GI.

## 2024-06-23 NOTE — Assessment & Plan Note (Signed)
 Chronic pain is managed with oxycodone  and buprenorphine , with fibromyalgia as a differential diagnosis. Continue current management and follow up with pain management.

## 2024-06-24 ENCOUNTER — Encounter: Payer: Self-pay | Admitting: Psychiatry

## 2024-06-24 ENCOUNTER — Telehealth: Admitting: Psychiatry

## 2024-06-24 DIAGNOSIS — F411 Generalized anxiety disorder: Secondary | ICD-10-CM | POA: Diagnosis not present

## 2024-06-24 DIAGNOSIS — G4701 Insomnia due to medical condition: Secondary | ICD-10-CM | POA: Diagnosis not present

## 2024-06-24 NOTE — Progress Notes (Unsigned)
 Virtual Visit via Video Note  I connected with Maureen Randall on 06/24/2024 at  2:30 PM EST by a video enabled telemedicine application and verified that I am speaking with the correct person using two identifiers.  Location Provider Location : [facility] Patient Location : Home  Participants: Patient , Provider    I discussed the limitations of evaluation and management by telemedicine and the availability of in person appointments. The patient expressed understanding and agreed to proceed.   I discussed the assessment and treatment plan with the patient. The patient was provided an opportunity to ask questions and all were answered. The patient agreed with the plan and demonstrated an understanding of the instructions.   The patient was advised to call back or seek an in-person evaluation if the symptoms worsen or if the condition fails to improve as anticipated.   BH MD OP Progress Note  06/24/2024 2:47 PM LEONDA CRISTO  MRN:  [id number]  Chief Complaint:  Chief Complaint  Patient presents with   Medication Refill   Follow-up   Anxiety   Depression   Discussed the use of AI scribe software for clinical note transcription with the patient, who gave verbal consent to proceed.  History of Present Illness Maureen Randall is a 58 year old Caucasian female, lives in [place], on disability, has a history of GAD, chronic pain, insomnia, primary osteoarthritis, radiculopathy, spondylolisthesis, bilateral carpal tunnel syndrome, recent back surgery was evaluated by telemedicine today for a follow-up appointment.  She reports that her anxiety has remained stable since the last visit, with no new concerns or questions. She denies any thoughts of harming herself or others.  Insomnia persists, which she primarily relates to pain and discomfort from chronic back issues. According to her, trazodone  200 mg nightly provides about 5 hours of restful sleep before she wakes, and she continues to  experience difficulty staying asleep due to pain and the need to change positions frequently. She notes that her current illness has further disrupted her sleep, as she has felt cold and has needed to bundle up at night.  She confirms that she currently takes Cymbalta  60 mg daily, trazodone  200 mg nightly, hydroxyzine  as needed. She has not made any changes to these medications since the last visit.  She states that she has an upcoming SI joint fusion surgery scheduled.  She was scheduled to have the surgery on December 23 however she was able to postpone it for March due to her current flulike symptoms.   Visit Diagnosis:    ICD-10-CM   1. GAD (generalized anxiety disorder)  F41.1     2. Insomnia due to medical condition  G47.01    Pain      Past Psychiatric History: I have reviewed past psychiatric history from progress note on 04/30/2018.  Past Medical History:  Past Medical History:  Diagnosis Date   Abnormal glucose    Adenoma of left adrenal gland 07/26/2020   Anterior to posterior tear of superior glenoid labrum of right shoulder 11/03/2020   Anxiety    Arthralgia of multiple joints 08/31/2015   Arthritis    osteoarthritis-joints, feet, shoulder   Asthma    previously diagnosed after a bout of bronchitis/ no problems since   Biceps tendinitis 09/10/2019   Bulging of cervical intervertebral disc without myelopathy    C6-C7   Bursitis    b/l hips   Chronic deep vein thrombosis (DVT) of both lower extremities (HCC)    Chronic nausea  Chronic pain syndrome    Closed fracture of metatarsal bone 08/06/2017   COPD (chronic obstructive pulmonary disease) (HCC) 2004   patient states probably bronchitis   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   Generalized osteoarthritis    GERD (gastroesophageal reflux disease)    H/O nasal septoplasty 09/10/2019   Headache, post-myelogram 09/11/2021   Headache, post-myelogram 09/11/2021   Hiatal hernia    History of lumbar fusion 01/29/2018   History of right hip replacement (12/2017) 01/29/2018   Hyperlipidemia    Increased band cell count 08/31/2015   Infection of superficial incisional surgical site after procedure 06/07/2021   IUD threads lost 04/30/2018   Left leg DVT (HCC)    fem/pop 12/07/20 after right shoulder surgery 11/30/20   Low back pain, chronic    Menopausal flushing 11/30/2020   Mood swings 03/15/2021   Neuropathy    post surgery   Obesity    Open wound 07/04/2021   Open wound of left lower leg 06/07/2021   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 08/17/2019   Peroneal neuropathy, left    Pneumonia    11/2021 [facility] at [facility]   Polyphagia    PONV (postoperative nausea and vomiting)    Postoperative nausea and vomiting    Pre-diabetes    Preoperative clearance 09/10/2019   PVD (peripheral vascular disease)    Recurrent falls    S/P shoulder replacement, right 12/22/2021   Sinusitis    Special screening for malignant neoplasms, colon    Spondylolisthesis    Strain of foot 01/17/2017   Venous insufficiency of both lower extremities 10/20/2018    Past Surgical History:  Procedure Laterality Date   ABLATION     veins in lower legs   BACK SURGERY  07/2014   lumbar spine/ fusion 1 level   BACK SURGERY  07/08/2023   BREAST BIOPSY Left 2011   neg   CARPAL TUNNEL RELEASE Right 12/2015   [facility]   CARPAL TUNNEL RELEASE Left 12/2016   CERVICAL DISC ARTHROPLASTY N/A 09/05/2022   Procedure: CERVICAL SIX-CERVICAL SEVEN ARTIFICIAL DISC REPLACEMENT;  Surgeon: Gillie Duncans, MD;  Location: [facility] OR;  Service: Neurosurgery;  Laterality: N/A;  RM 18/3C    COLONOSCOPY WITH PROPOFOL  N/A 02/29/2020   Procedure: COLONOSCOPY WITH PROPOFOL ;  Surgeon: Jinny Carmine, MD;  [facility];  Service: Endoscopy;  Laterality: N/A;   ESOPHAGOGASTRODUODENOSCOPY N/A 03/03/2024   Procedure: EGD (ESOPHAGOGASTRODUODENOSCOPY);  Surgeon: Unk Corinn Skiff, MD;  Location: [facility];  Service: Endoscopy;  Laterality: N/A;   ESOPHAGOGASTRODUODENOSCOPY (EGD) WITH PROPOFOL  N/A 02/29/2020   Procedure: ESOPHAGOGASTRODUODENOSCOPY (EGD) WITH PROPOFOL ;  Surgeon: Jinny Carmine, MD;  Location: [facility];  Service: Endoscopy;  Laterality: N/A;   FOOT SURGERY Right 2015   broken navicular bone   HIP ARTHROSCOPY Right 05/2011   HIP SURGERY     left hip surgery 05/2018   HYSTEROSCOPY N/A 11/22/2022   Procedure: HYSTEROSCOPY, IUD REMOVAL;  Surgeon: Verdon Keen, MD;  Location: [facility] ORS;  Service: Gynecology;  Laterality: N/A;   JOINT REPLACEMENT Bilateral 12/2017 & 05/2018   hip   L5-S1 OLIF Dr. Royden Schneider back surgery 09/2019  09/28/2019  left ganglion cyst removal     12/23/19 Dr. Lilli   MASS EXCISION Left 12/17/2019   Procedure: EXCISION TUMOR FOOT DEEP LEFT;  Surgeon: Lilli Cough, DPM;  Location: [facility];  Service: Podiatry;  Laterality: Left;  LMA W/LOCAL   NASAL TURBINATE REDUCTION Bilateral 06/12/2019   Procedure: TURBINATE REDUCTION/SUBMUCOSAL RESECTION;  Surgeon: Herminio Miu, MD;  Location: [facility];  Service: ENT;  Laterality: Bilateral;   REPLACEMENT TOTAL HIP W/  RESURFACING IMPLANTS Right 12/2017   [facility]   right shoulder surgery     11/28/20 for bone spur, torn labrum, re attached biceps tendon, tear Dr. Kay   right shoulder surgery     12/22/21 total shoulder replacement [facility] Dr. Kay FRIED CUFF REPAIR     Dr. Kay in 04/2019    SCLEROTHERAPY     veins in lower legs   SEPTOPLASTY Bilateral 06/12/2019   Procedure: SEPTOPLASTY;  Surgeon: Herminio Miu, MD;   Location: [facility];  Service: ENT;  Laterality: Bilateral;   SHOULDER SURGERY Right 12/2015   [facility] torn rotator cuff   SPINAL FUSION  2016   [facility] lumbar   TOTAL SHOULDER ARTHROPLASTY Right 12/22/2021   Procedure: TOTAL SHOULDER ARTHROPLASTY;  Surgeon: Kay Kemps, MD;  Location: [facility] ORS;  Service: Orthopedics;  Laterality: Right;  with ISB    Family Psychiatric History: I have reviewed family psychiatric history from progress note on 04/30/2018.  Family History:  Family History  Problem Relation Age of Onset   Breast cancer Maternal Aunt    Breast cancer Paternal Aunt    Dementia Paternal Aunt    COPD Mother        quit smoking after 60 years   Arthritis Mother    Osteoarthritis Mother    Dementia Father        symptoms started in his 35s   Alzheimer's disease Father    Rheum arthritis Father    Rheum arthritis Other        FH   Dementia Other        all my dad's side of the family   Other Maternal Grandmother        got dementia as she got older    Dementia Paternal Grandmother     Social History: I have reviewed social history from progress note on 04/30/2018. 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    Married 1 child    Lives at home with her husband   Right handed   Caffeine: maybe 1 cup/day (pepsi)   Social Drivers of Health   Tobacco Use: Low Risk (06/24/2024)   Patient History    Smoking Tobacco Use: Never     Smokeless Tobacco Use: Never    Passive Exposure: Not on file  Financial Resource Strain: Medium Risk (02/18/2024)   [facility] from [facility]   Overall Financial Resource Strain (CARDIA)    Difficulty of Paying Living Expenses: Somewhat hard  Food Insecurity: Food Insecurity Present (02/18/2024)   Received from [facility]   Epic    Within the past 12 months, you worried that your food would run out before you got the money to buy more.: Sometimes true    Within the past 12 months, the food you bought just didn't last and you didn't have money to get more.: Sometimes true  Transportation Needs: No Transportation Needs (02/18/2024)   Received from [facility] - Transportation    In the past 12 months, has lack of transportation kept you from medical appointments or from getting medications?: No    Lack of Transportation (Non-Medical): No  Physical Activity: Inactive (12/09/2023)   Exercise Vital Sign    Days of Exercise per Week: 0 days    Minutes of Exercise per Session: 0 min  Stress: No Stress Concern Present (12/09/2023)   [employer] of Occupational Health - Occupational Stress Questionnaire    Feeling of Stress : Only a little  Recent Concern: Stress - Stress Concern Present (09/19/2023)   Received from [facility] of Occupational Health - Occupational Stress Questionnaire    Feeling of Stress : Rather much  Social Connections: Moderately Isolated (12/09/2023)   Social Connection and Isolation Panel    Frequency of Communication with Friends and Family: More than three times a week    Frequency of Social Gatherings with Friends and Family: More than three times a week    Attends Religious Services: Never    Database Administrator or Organizations: No    Attends Banker Meetings: Never    Marital Status: Married  Depression (PHQ2-9): Low Risk (06/10/2024)   Depression  (PHQ2-9)    PHQ-2 Score: 2  Alcohol Screen: Low Risk (12/09/2023)   Alcohol Screen    Last Alcohol Screening Score (AUDIT): 0  Housing: Low Risk  (02/18/2024)   Received from [facility]   Epic    In the last 12 months, was there a time when you were not able to pay the mortgage or rent on time?: No    In the past 12 months, how many times have you moved where you were living?: 0    At any time in the past 12 months, were you homeless or living in a shelter (including now)?: No  Recent Concern: Housing - High Risk (12/09/2023)   Housing Stability Vital Sign    Unable to Pay for Housing in the Last Year: Yes    Number of Times Moved in the Last Year: Not on file    Homeless in the Last Year: Yes  Utilities: Not At Risk (02/18/2024)   Received from [facility]   Epic    In the past 12 months has the electric, gas, oil, or water  company threatened to shut off services in your home?: No  Health Literacy: Adequate Health Literacy (12/09/2023)   B1300 Health Literacy    Frequency of need for help with medical instructions: Never    Allergies: Allergies[1]  Metabolic Disorder Labs: Lab Results  Component Value Date   HGBA1C 5.3 03/18/2024   MPG 111.15 12/14/2021   No results found for: PROLACTIN Lab Results  Component Value Date   CHOL 189 04/29/2024   TRIG 80 04/29/2024   HDL 67 04/29/2024  CHOLHDL 2.8 04/29/2024   VLDL 80.5 05/17/2022   LDLCALC 107 (H) 04/29/2024   LDLCALC 147 (H) 11/06/2023   Lab Results  Component Value Date   TSH 3.04 11/05/2022   TSH 2.78 05/17/2022    Therapeutic Level Labs: No results found for: LITHIUM No results found for: VALPROATE No results found for: CBMZ  Current Medications: Current Outpatient Medications  Medication Sig Dispense Refill   baclofen  (LIORESAL ) 10 MG tablet Take 1 tablet (10 mg total) by mouth 3 (three) times daily as needed for muscle spasms. 60 each 1   buprenorphine  (SUBUTEX ) 2  MG SUBL SL tablet SMARTSIG:0.5-1 Tablet(s) Sublingual Daily PRN     cetirizine (ZYRTEC) 10 MG tablet Take 10 mg by mouth daily as needed for allergies.     cyanocobalamin  (VITAMIN B12) 500 MCG tablet Take 1 tablet (500 mcg total) by mouth daily. 90 tablet 0   diclofenac  Sodium (VOLTAREN ) 1 % GEL Apply 2 g topically. (Patient not taking: Reported on 05/20/2024)     doxycycline  (VIBRAMYCIN ) 100 MG capsule Take 100 mg by mouth 2 (two) times daily.     DULoxetine  (CYMBALTA ) 60 MG capsule TAKE ONE CAPSULE BY MOUTH ONE TIME DAILY 90 capsule 3   estradiol  (ESTRACE ) 0.1 MG/GM vaginal cream Place 1 Applicatorful vaginally once a week.     ezetimibe  (ZETIA ) 10 MG tablet Take 10 mg by mouth.     famotidine  (PEPCID ) 20 MG tablet Take 1 tablet (20 mg total) by mouth at bedtime. 90 tablet 3   fluticasone  (FLONASE ) 50 MCG/ACT nasal spray SHAKE LIQUID AND USE 1 TO 2 SPRAYS IN EACH NOSTRIL DAILY AS NEEDED. MAX DOSE 2 SPRAYS IN EACH NOSTRIL 16 g 11   lactulose, encephalopathy, (CHRONULAC) 10 GM/15ML SOLN Take 30 mLs by mouth.     nitrofurantoin  (MACRODANTIN ) 100 MG capsule Take 100 mg by mouth.     omeprazole  (PRILOSEC) 40 MG capsule Take 1 capsule (40 mg total) by mouth 2 (two) times daily. 60 capsule 3   ondansetron  (ZOFRAN ) 8 MG tablet Take 8 mg by mouth once.     ondansetron  (ZOFRAN -ODT) 4 MG disintegrating tablet Take 0.5 tablets (2 mg total) by mouth every 6 (six) hours as needed. 20 tablet 0   oxyCODONE -acetaminophen  (PERCOCET) 10-325 MG tablet Take 1 tablet by mouth in the morning, at noon, in the evening, and at bedtime.     polyethylene glycol powder (GLYCOLAX /MIRALAX ) 17 GM/SCOOP powder Take 17 g by mouth daily. Dissolve 1 capful (17g) in 4-8 ounces of liquid and take by mouth daily. 510 g 5   pregabalin  (LYRICA ) 100 MG capsule Take 100 mg by mouth 3 (three) times daily.     promethazine  (PHENERGAN ) 25 MG tablet      rivaroxaban  (XARELTO ) 10 MG TABS tablet Take 10 mg by mouth in the morning. (1000)      semaglutide -weight management (WEGOVY ) 1 MG/0.5ML SOAJ SQ injection Inject 1mg  every 10 days 2 mL 0   tiZANidine (ZANAFLEX) 4 MG tablet Take 4 mg by mouth daily as needed.     traZODone  (DESYREL ) 100 MG tablet Take 2 tablets (200 mg total) by mouth at bedtime as needed for sleep. 180 tablet 3   valACYclovir  (VALTREX ) 500 MG tablet Take 500 mg by mouth 2 (two) times daily as needed (outbreaks).     No current facility-administered medications for this visit.     Musculoskeletal: Strength & Muscle Tone: UTA Gait & Station: Seated Patient leans: N/A  Psychiatric Specialty Exam: Review of  Systems  Psychiatric/Behavioral:  Positive for sleep disturbance.     There were no vitals taken for this visit.There is no height or weight on file to calculate BMI.  General Appearance: Fairly Groomed  Eye Contact:  Fair  Speech:  Clear and Coherent  Volume:  Normal  Mood:  Euthymic  Affect:  Congruent  Thought Process:  Goal Directed and Descriptions of Associations: Intact  Orientation:  Full (Time, Place, and Person)  Thought Content: Logical   Suicidal Thoughts:  No  Homicidal Thoughts:  No  Memory:  Immediate;   Fair Recent;   Fair Remote;   Fair  Judgement:  Fair  Insight:  Fair  Psychomotor Activity:  Normal  Concentration:  Concentration: Fair and Attention Span: Fair  Recall:  [employer] of Knowledge: Fair  Language: Fair  Akathisia:  No  Handed:  Right  AIMS (if indicated): not done  Assets:  Communication Skills Desire for Improvement Housing Social Support Transportation  ADL's:  Intact  Cognition: WNL  Sleep:  varies due to pain   Screenings: AIMS    Flowsheet Row Video Visit from 12/26/2021 in [facility]  AIMS Total Score 0   GAD-7    Flowsheet Row Office Visit from 06/10/2024 in [facility] [employer] Visit from 01/28/2024 in [facility]  Visit from 10/31/2023 in [facility] Office Visit from 02/20/2023 in [facility] Office Visit from 01/07/2023 in [facility]  Total GAD-7 Score 3 1 3 3 2    Mini-Mental    Flowsheet Row Office Visit from 09/23/2019 in [facility]  Total Score (max 30 points ) 27   PHQ2-9    Flowsheet Row Office Visit from 06/10/2024 in [facility] Visit from 01/28/2024 in [facility] [employer] Clinical Support from 12/09/2023 in [facility] [employer] Visit from 10/31/2023 in [facility] Office Visit from 02/20/2023 in [facility]  PHQ-2 Total Score 0 0 0 1 1  PHQ-9 Total Score 2 1 2  -- 3   Flowsheet Row Video Visit from 06/24/2024 in [facility] Admission (Discharged) from 03/03/2024 in [facility] Video Visit from 12/19/2023 in [facility]  C-SSRS RISK CATEGORY No Risk No Risk No Risk     Assessment and Plan: Maureen Randall is a 58 year old Caucasian female who has a history of GAD, insomnia, chronic pain was evaluated by telemedicine today.  Discussed assessment and plan as noted below.  1. GAD (generalized anxiety disorder)-improving Some anxiety mostly related to her current health issues including uncontrolled pain however reports current medication regimen is beneficial. Continue Cymbalta  60 mg daily Continue Hydroxyzine  10 mg daily as needed Encouraged to establish care with therapist for CBT  2. Insomnia due to medical condition-unstable Currently reports pain does have an effect on sleep.  Has upcoming surgery scheduled. Continue Trazodone  200 mg at  bedtime Will need sufficient pain management.  Follow-up Follow-up in clinic in 5 to 6 months or sooner if needed.     Collaboration of Care: Collaboration of Care: Referral or follow-up with counselor/therapist AEB patient to establish care with therapist for CBT, provided resources previously.  Patient/Guardian was advised Release  of Information must be obtained prior to any record release in order to collaborate their care with an outside provider. Patient/Guardian was advised if they have not already done so to contact the registration department to sign all necessary forms in order for us  to release information regarding their care.   Consent: Patient/Guardian gives verbal consent for treatment and assignment of benefits for services provided during this visit. Patient/Guardian expressed understanding and agreed to proceed.  This note was generated in part or whole with voice recognition software. Voice recognition is usually quite accurate but there are transcription errors that can and very often do occur. I apologize for any typographical errors that were not detected and corrected.     Adorian Gwynne, MD 06/25/2024, 4:31 PM     [1]  Allergies Allergen Reactions   Amoxicillin Hives   Sulfamethoxazole Hives   Sulfamethoxazole-Trimethoprim Hives    Septra,  Septra,     Septra    Hives Septra    Septra,  sulfamethoxazole / trimethoprim   Tizanidine Other (See Comments)    Weak muscles  Other Reaction(s): Not available  tizanidine   Cephalexin Rash and Dermatitis    rash   Rosuvastatin  Other (See Comments)    Myalgia  Other Reaction(s): Myalgias  Myalgia  Myalgia  Other Reaction(s): Myalgias    Myalgia  Other Reaction(s): Myalgias  Myalgia  Myalgia  Other Reaction(s): Myalgias  Myalgia  Other Reaction(s): Myalgias    Myalgia  Other Reaction(s): Myalgias    Myalgia  Myalgia  Other Reaction(s): Myalgias   Trimethoprim Hives

## 2024-07-10 ENCOUNTER — Encounter (INDEPENDENT_AMBULATORY_CARE_PROVIDER_SITE_OTHER): Payer: Self-pay | Admitting: Adult Health

## 2024-07-13 ENCOUNTER — Encounter (INDEPENDENT_AMBULATORY_CARE_PROVIDER_SITE_OTHER): Payer: Self-pay | Admitting: Adult Health

## 2024-07-13 ENCOUNTER — Other Ambulatory Visit (INDEPENDENT_AMBULATORY_CARE_PROVIDER_SITE_OTHER): Payer: Self-pay | Admitting: Adult Health

## 2024-07-13 DIAGNOSIS — R7303 Prediabetes: Secondary | ICD-10-CM

## 2024-07-13 DIAGNOSIS — E669 Obesity, unspecified: Secondary | ICD-10-CM

## 2024-07-13 MED ORDER — WEGOVY 1 MG/0.5ML ~~LOC~~ SOAJ: SUBCUTANEOUS | 0 refills | Status: DC

## 2024-07-21 ENCOUNTER — Ambulatory Visit (INDEPENDENT_AMBULATORY_CARE_PROVIDER_SITE_OTHER): Admitting: Adult Health

## 2024-07-28 MED ORDER — WEGOVY 1 MG/0.5ML ~~LOC~~ SOAJ: SUBCUTANEOUS | 0 refills | Status: AC

## 2024-07-29 ENCOUNTER — Ambulatory Visit: Admitting: Nurse Practitioner

## 2024-08-04 ENCOUNTER — Ambulatory Visit (INDEPENDENT_AMBULATORY_CARE_PROVIDER_SITE_OTHER): Admitting: Adult Health

## 2024-08-14 ENCOUNTER — Encounter: Payer: Self-pay | Admitting: Nurse Practitioner

## 2024-08-20 ENCOUNTER — Ambulatory Visit (INDEPENDENT_AMBULATORY_CARE_PROVIDER_SITE_OTHER): Admitting: Adult Health

## 2024-09-17 ENCOUNTER — Ambulatory Visit: Admitting: Gastroenterology

## 2024-12-03 ENCOUNTER — Ambulatory Visit: Admitting: Psychiatry

## 2024-12-09 ENCOUNTER — Ambulatory Visit: Admitting: Nurse Practitioner

## 2024-12-09 ENCOUNTER — Ambulatory Visit
